# Patient Record
Sex: Female | Born: 1975
Health system: Southern US, Community
[De-identification: ages and names within clinical notes are randomized; demographics above are authoritative.]

## PROBLEM LIST (undated history)

## (undated) DIAGNOSIS — G43909 Migraine, unspecified, not intractable, without status migrainosus: Secondary | ICD-10-CM

## (undated) DIAGNOSIS — F419 Anxiety disorder, unspecified: Secondary | ICD-10-CM

## (undated) DIAGNOSIS — F319 Bipolar disorder, unspecified: Secondary | ICD-10-CM

## (undated) DIAGNOSIS — R251 Tremor, unspecified: Secondary | ICD-10-CM

## (undated) DIAGNOSIS — R51 Headache: Secondary | ICD-10-CM

## (undated) DIAGNOSIS — F32A Depression, unspecified: Secondary | ICD-10-CM

## (undated) DIAGNOSIS — I10 Essential (primary) hypertension: Secondary | ICD-10-CM

## (undated) DIAGNOSIS — R569 Unspecified convulsions: Secondary | ICD-10-CM

## (undated) DIAGNOSIS — R519 Headache, unspecified: Secondary | ICD-10-CM

## (undated) DIAGNOSIS — K589 Irritable bowel syndrome without diarrhea: Secondary | ICD-10-CM

## (undated) DIAGNOSIS — E7800 Pure hypercholesterolemia, unspecified: Secondary | ICD-10-CM

## (undated) DIAGNOSIS — M719 Bursopathy, unspecified: Secondary | ICD-10-CM

## (undated) DIAGNOSIS — K219 Gastro-esophageal reflux disease without esophagitis: Secondary | ICD-10-CM

## (undated) DIAGNOSIS — E669 Obesity, unspecified: Secondary | ICD-10-CM

## (undated) DIAGNOSIS — F329 Major depressive disorder, single episode, unspecified: Secondary | ICD-10-CM

## (undated) DIAGNOSIS — E119 Type 2 diabetes mellitus without complications: Secondary | ICD-10-CM

## (undated) DIAGNOSIS — T7840XA Allergy, unspecified, initial encounter: Secondary | ICD-10-CM

## (undated) DIAGNOSIS — J45909 Unspecified asthma, uncomplicated: Secondary | ICD-10-CM

## (undated) HISTORY — DX: Unspecified convulsions: R56.9

## (undated) HISTORY — DX: Depression, unspecified: F32.A

## (undated) HISTORY — DX: Headache: R51

## (undated) HISTORY — DX: Tremor, unspecified: R25.1

## (undated) HISTORY — DX: Headache, unspecified: R51.9

## (undated) HISTORY — DX: Pure hypercholesterolemia, unspecified: E78.00

## (undated) HISTORY — DX: Type 2 diabetes mellitus without complications: E11.9

## (undated) HISTORY — DX: Bursopathy, unspecified: M71.9

## (undated) HISTORY — DX: Irritable bowel syndrome, unspecified: K58.9

## (undated) HISTORY — DX: Essential (primary) hypertension: I10

## (undated) HISTORY — DX: Unspecified asthma, uncomplicated: J45.909

## (undated) HISTORY — DX: Obesity, unspecified: E66.9

## (undated) HISTORY — DX: Major depressive disorder, single episode, unspecified: F32.9

## (undated) HISTORY — DX: Gastro-esophageal reflux disease without esophagitis: K21.9

## (undated) HISTORY — DX: Irritable bowel syndrome without diarrhea: K58.9

## (undated) HISTORY — DX: Anxiety disorder, unspecified: F41.9

## (undated) HISTORY — DX: Bipolar disorder, unspecified: F31.9

## (undated) HISTORY — DX: Allergy, unspecified, initial encounter: T78.40XA

## (undated) HISTORY — PX: NO PAST SURGERIES: SHX2092

## (undated) HISTORY — DX: Migraine, unspecified, not intractable, without status migrainosus: G43.909

---

## 2010-02-23 DIAGNOSIS — K589 Irritable bowel syndrome without diarrhea: Secondary | ICD-10-CM

## 2010-02-23 HISTORY — DX: Irritable bowel syndrome, unspecified: K58.9

## 2010-05-05 DIAGNOSIS — H52229 Regular astigmatism, unspecified eye: Secondary | ICD-10-CM | POA: Insufficient documentation

## 2013-06-10 ENCOUNTER — Emergency Department: Payer: Self-pay

## 2013-06-10 LAB — COMPREHENSIVE METABOLIC PANEL
Albumin: 3.4 g/dL (ref 3.4–5.0)
Alkaline Phosphatase: 57 U/L
Anion Gap: 7 (ref 7–16)
BUN: 12 mg/dL (ref 7–18)
Bilirubin,Total: 0.4 mg/dL (ref 0.2–1.0)
Calcium, Total: 9.2 mg/dL (ref 8.5–10.1)
Chloride: 103 mmol/L (ref 98–107)
Co2: 29 mmol/L (ref 21–32)
Creatinine: 0.93 mg/dL (ref 0.60–1.30)
EGFR (African American): >60
EGFR (Non-African Amer.): >60
Glucose: 91 mg/dL (ref 65–99)
Osmolality: 277 (ref 275–301)
Potassium: 3.6 mmol/L (ref 3.5–5.1)
SGOT(AST): 102 U/L — ABNORMAL HIGH (ref 15–37)
SGPT (ALT): 59 U/L (ref 12–78)
Sodium: 139 mmol/L (ref 136–145)
Total Protein: 7.4 g/dL (ref 6.4–8.2)

## 2013-06-10 LAB — CBC
HCT: 43.9 % (ref 35.0–47.0)
HGB: 14.2 g/dL (ref 12.0–16.0)
MCH: 30.5 pg (ref 26.0–34.0)
MCHC: 32.4 g/dL (ref 32.0–36.0)
MCV: 94 fL (ref 80–100)
Platelet: 251 10*3/uL (ref 150–440)
RBC: 4.66 10*6/uL (ref 3.80–5.20)
RDW: 13.1 % (ref 11.5–14.5)
WBC: 9 10*3/uL (ref 3.6–11.0)

## 2013-06-11 ENCOUNTER — Emergency Department: Payer: Self-pay | Admitting: Emergency Medicine

## 2013-06-11 LAB — CBC WITH DIFFERENTIAL/PLATELET
Basophil #: 0.1 10*3/uL (ref 0.0–0.1)
Basophil %: 1 %
Eosinophil #: 0.2 10*3/uL (ref 0.0–0.7)
Eosinophil %: 2.5 %
HCT: 42.1 % (ref 35.0–47.0)
HGB: 14.2 g/dL (ref 12.0–16.0)
Lymphocyte #: 2 10*3/uL (ref 1.0–3.6)
Lymphocyte %: 25.5 %
MCH: 31.8 pg (ref 26.0–34.0)
MCHC: 33.7 g/dL (ref 32.0–36.0)
MCV: 94 fL (ref 80–100)
Monocyte #: 0.4 x10 3/mm (ref 0.2–0.9)
Monocyte %: 5.6 %
Neutrophil #: 5.2 10*3/uL (ref 1.4–6.5)
Neutrophil %: 65.4 %
Platelet: 232 10*3/uL (ref 150–440)
RBC: 4.46 10*6/uL (ref 3.80–5.20)
RDW: 12.9 % (ref 11.5–14.5)
WBC: 8 10*3/uL (ref 3.6–11.0)

## 2013-06-11 LAB — BASIC METABOLIC PANEL
Anion Gap: 6 — ABNORMAL LOW (ref 7–16)
BUN: 14 mg/dL (ref 7–18)
Calcium, Total: 8.7 mg/dL (ref 8.5–10.1)
Chloride: 100 mmol/L (ref 98–107)
Co2: 30 mmol/L (ref 21–32)
Creatinine: 0.67 mg/dL (ref 0.60–1.30)
EGFR (African American): >60
EGFR (Non-African Amer.): >60
Glucose: 81 mg/dL (ref 65–99)
Osmolality: 271 (ref 275–301)
Potassium: 3.5 mmol/L (ref 3.5–5.1)
Sodium: 136 mmol/L (ref 136–145)

## 2013-06-11 LAB — URINALYSIS, COMPLETE
Bacteria: NONE SEEN
Bilirubin,UR: NEGATIVE
Blood: NEGATIVE
Glucose,UR: NEGATIVE mg/dL (ref 0–75)
Leukocyte Esterase: NEGATIVE
Nitrite: NEGATIVE
Ph: 7 (ref 4.5–8.0)
Protein: NEGATIVE
RBC,UR: <1 /HPF (ref 0–5)
Specific Gravity: 1.024 (ref 1.003–1.030)
Squamous Epithelial: 1
WBC UR: <1 /HPF (ref 0–5)

## 2013-06-11 LAB — DRUG SCREEN, URINE
Amphetamines, Ur Screen: NEGATIVE (ref ?–1000)
Barbiturates, Ur Screen: POSITIVE (ref ?–200)
Benzodiazepine, Ur Scrn: POSITIVE (ref ?–200)
Cannabinoid 50 Ng, Ur ~~LOC~~: POSITIVE (ref ?–50)
Cocaine Metabolite,Ur ~~LOC~~: NEGATIVE (ref ?–300)
MDMA (Ecstasy)Ur Screen: NEGATIVE (ref ?–500)
Methadone, Ur Screen: NEGATIVE (ref ?–300)
Opiate, Ur Screen: NEGATIVE (ref ?–300)
Phencyclidine (PCP) Ur S: NEGATIVE (ref ?–25)
Tricyclic, Ur Screen: POSITIVE (ref ?–1000)

## 2013-06-11 LAB — SALICYLATE LEVEL: Salicylates, Serum: 3.9 mg/dL — ABNORMAL HIGH

## 2013-06-11 LAB — ACETAMINOPHEN LEVEL: Acetaminophen: <2 ug/mL

## 2013-06-11 LAB — ETHANOL
Ethanol %: <0.003 % (ref 0.000–0.080)
Ethanol: <3 mg/dL

## 2013-06-11 LAB — TSH: Thyroid Stimulating Horm: 1.43 u[IU]/mL

## 2013-06-11 LAB — VALPROIC ACID LEVEL: Valproic Acid: 86 ug/mL

## 2013-11-25 DIAGNOSIS — E669 Obesity, unspecified: Secondary | ICD-10-CM

## 2013-11-25 DIAGNOSIS — R519 Headache, unspecified: Secondary | ICD-10-CM | POA: Insufficient documentation

## 2013-11-25 DIAGNOSIS — R51 Headache: Secondary | ICD-10-CM

## 2013-11-25 HISTORY — DX: Obesity, unspecified: E66.9

## 2014-05-29 NOTE — Consult Note (Signed)
PATIENT NAME:  Vanessa Baker, Vanessa Baker MR#:  [id number] DATE OF BIRTH:  [birth year]  DATE OF CONSULTATION:  06/11/2013  REFERRING PHYSICIAN:   Lavonia Drafts, MD CONSULTING PHYSICIAN:  Cordelia Pen. Gretel Acre, MD  REASON FOR CONSULTATION: Medication adjustment.  HISTORY OF PRESENT ILLNESS:  The patient is a 39 year old female who presented to the ER accompanied by her family members.  She reported that she has been having problems with  slurred speech, tremors and states, "I cannot hold my neck up."   She stated that she started having these symptoms for the past 2 days. She has been having some confusion and complaining of pain in her back of the head and unable to keep her head up. She was having dizziness and blurred vision. She stated that was seen in the ED last night, but left after waiting for a few hours and had the CT scan done at that time. A psychiatric consult was done to have her medications adjusted as she is taking a lot of psychotropic medications.   During my interview, the patient was lying on the bed. Her sister and brother-in-law were present in the room. She reported that the left side of her neck is tilted for the past 2 days. She stated that she took Abilify, which was started recently. Reported that she has a long history of bipolar disorder and she follows with a psychiatrist, Dr. Jana Half in [facility]. She stated that she works at [employer].  Stated that she has been taking a combination of sertraline, Depakote, Abilify, propranolol and Topamax for a long period of time. There are no recent changes in her medications. However, she is unable to sleep at night. The patient reported that she was unable to sleep and she has started sleepwalking recently. She took  amitriptyline from her mother 2 days ago as she was unable to sleep. She reported that since then she has felt that her neck has been tilted. The patient stated that she came to the hospital to seek help. Her sister was concerned that she might be  having a reaction to the medications. The patient currently denied having any suicidal or homicidal ideation or plan. She denied having any perceptual disturbances.   PAST PSYCHIATRIC HISTORY: The patient reported that she has never attempted suicide and was never admitted to a psychiatric hospital. Reported that she has been diagnosed with bipolar disorder and has had testing done for the same. She is currently on the combination of Depakote, sertraline, Topamax, propranolol, Xanax, Abilify and Ambien. She reported that she started  sleepwalking recently. She stated that she does not have any thoughts to hurt herself.   FAMILY HISTORY: Strong family history of bipolar disorder in her sister and mother. They all take bipolar medications and have been admitted to psychiatric hospitals. No history of suicide in the family.   SUBSTANCE ABUSE HISTORY: The patient reported that there is no history of illicit drug use. She drinks occasionally. She does not smoke cigarettes.   SOCIAL HISTORY: The patient currently lives with a roommate; however, she is going to relocate and live with her sister soon. 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 She lives in [place]. She does not have any children.   ALLERGIES: No known drug allergies.   CURRENT MEDICATIONS: Depakote ER 500 mg 2 pills in the morning, 1 at night; sertraline 2 pills in the morning, 1 at night; Topamax, 100 mg b.i.d., propranolol 40 mg b.i.d., Xanax XR 1 pill b.i.d., Abilify 50  mg p.o. daily, zolpidem 10 mg at bedtime.   ANCILLARY DATA:  Temperature 98.2, pulse 70, respirations 17, blood pressure 82/49.   LABORATORY DATA:  Glucose 81, BUN 14, creatinine 0.67, sodium 136, potassium 3.6, chloride 100, bicarbonate 30, anion gap 6,  calcium 8.7, protein 7.4, albumin 3.4, bilirubin 0.4, alkaline phosphatase 37. AST is 102. Depakote level is 86. UDS is positive for barbiturates, benzodiazepines, cannabinoids and tricyclic antidepressants.  WBC  is 8.0. Hemoglobin is 14.2. Platelet count is 232. MCV is 94.   REVIEW OF SYSTEMS: Positive for neck pain and stiffness in the neck. Denies any headache, hematuria. Denies any muscle aches, joint pains.   MENTAL STATUS EXAMINATION: The patient is a moderately built female who was lying in the bed. She appeared her stated age. She maintained fair eye contact. Her speech was low in tone and volume. Mood was anxious. Affect was congruent. Thought process was logical, goal-directed. Thought content was nondelusional. She currently denied having any suicidal or homicidal ideations or plans. She currently denied having any perceptual disturbances. Demonstrated fair insight and judgment. Her language and fund of knowledge appears appropriate.   DIAGNOSTIC IMPRESSION: AXIS I:  1.  Bipolar disorder, not otherwise specified.  2.  Adverse effects to the medications.  AXIS II: None reported.  AXIS III: None.   TREATMENT PLAN: 1.  I discussed with the patient and her family at length about the medication's adverse reactions at length.  Currently, she is experiencing the adverse effects of the amitriptyline and the combination of her other psychotropic medications. She will be given Benadryl 50 mg IM for the same.  2.  Also discussed her case with the ED physician and her medications will be adjusted as follows: Depakote ER 500 mg p.o. b.i.d., sertraline 100 mg p.o. q.a.m., Topamax 25 mg p.o. at bedtime, propranolol 10 mg p.o. b.i.d., Xanax 1 mg p.o. b.i.d. and Abilify 10 mg p.o. q.a.m. The patient will be given prescription for the same. I advised her to follow up with her outpatient psychiatrist and she demonstrated  understanding.  I advised the patient to return to the ED if she noticed worsening of her symptoms, and she agreed with the plan.   Thank you for allowing me to participate in the care of this patient.   ____________________________ Cordelia Pen. Gretel Acre, MD usf:dmm D: 06/11/2013 15:34:44  ET T: 06/11/2013 19:48:30 ET JOB#: [id number]  cc: Cordelia Pen. Gretel Acre, MD, <Dictator> Jeronimo Norma MD ELECTRONICALLY SIGNED 06/18/2013 10:58

## 2014-12-09 ENCOUNTER — Ambulatory Visit: Payer: Self-pay

## 2014-12-09 LAB — BASIC METABOLIC PANEL
BUN: 11 mg/dL (ref 4–21)
Creatinine: 0.6 mg/dL (ref <=1.1)
Glucose: 88 mg/dL
Potassium: 4.8 mmol/L (ref 3.4–5.3)
Sodium: 139 mmol/L (ref 137–147)

## 2014-12-09 LAB — CBC AND DIFFERENTIAL
HCT: 42 % (ref 36–46)
Hemoglobin: 14.4 g/dL (ref 12.0–16.0)
Neutrophils Absolute: 6 /uL
Platelets: 278 10*3/uL (ref 150–399)
WBC: 10.5 10^3/mL

## 2014-12-09 LAB — TSH: TSH: 2.55 u[IU]/mL (ref <=5.90)

## 2014-12-09 LAB — HEMOGLOBIN A1C: Hemoglobin A1C: 5.4

## 2014-12-16 ENCOUNTER — Ambulatory Visit: Payer: Self-pay

## 2014-12-17 ENCOUNTER — Other Ambulatory Visit: Payer: Self-pay | Admitting: Nurse Practitioner

## 2014-12-17 DIAGNOSIS — K5909 Other constipation: Secondary | ICD-10-CM

## 2014-12-20 ENCOUNTER — Ambulatory Visit: Payer: Self-pay

## 2015-01-05 ENCOUNTER — Institutional Professional Consult (permissible substitution): Payer: Self-pay | Admitting: Specialist

## 2015-01-19 ENCOUNTER — Ambulatory Visit: Payer: Self-pay

## 2015-02-16 ENCOUNTER — Ambulatory Visit: Payer: Self-pay

## 2015-02-16 ENCOUNTER — Encounter (INDEPENDENT_AMBULATORY_CARE_PROVIDER_SITE_OTHER): Payer: Self-pay

## 2015-03-15 ENCOUNTER — Ambulatory Visit: Payer: Self-pay

## 2015-03-18 ENCOUNTER — Ambulatory Visit
Admission: RE | Admit: 2015-03-18 | Discharge: 2015-03-18 | Disposition: A | Discharge status: Home / Self Care | Payer: Self-pay | Source: Ambulatory Visit | Attending: Nurse Practitioner | Admitting: Nurse Practitioner

## 2015-03-18 ENCOUNTER — Ambulatory Visit
Admission: RE | Admit: 2015-03-18 | Discharge: 2015-03-18 | Disposition: A | Discharge status: Home / Self Care | Payer: Self-pay | Source: Ambulatory Visit | Attending: *Deleted | Admitting: *Deleted

## 2015-03-18 DIAGNOSIS — K5909 Other constipation: Secondary | ICD-10-CM

## 2015-03-18 DIAGNOSIS — I499 Cardiac arrhythmia, unspecified: Secondary | ICD-10-CM | POA: Insufficient documentation

## 2015-03-18 DIAGNOSIS — Z79899 Other long term (current) drug therapy: Secondary | ICD-10-CM | POA: Insufficient documentation

## 2015-03-24 ENCOUNTER — Other Ambulatory Visit: Payer: Self-pay

## 2015-03-24 LAB — LIPID PANEL
Cholesterol: 272 mg/dL — AB (ref 0–200)
HDL: 42 mg/dL (ref 35–70)
LDL Cholesterol: 153 mg/dL
Triglycerides: 387 mg/dL — AB (ref 40–160)

## 2015-03-24 LAB — HEPATIC FUNCTION PANEL
ALT: 7 U/L (ref 7–35)
AST: 16 U/L (ref 13–35)
Alkaline Phosphatase: 55 U/L (ref 25–125)
Bilirubin, Total: 0.1 mg/dL

## 2015-03-31 ENCOUNTER — Ambulatory Visit: Payer: Self-pay

## 2015-03-31 DIAGNOSIS — F3175 Bipolar disorder, in partial remission, most recent episode depressed: Secondary | ICD-10-CM | POA: Insufficient documentation

## 2015-03-31 DIAGNOSIS — F32A Depression, unspecified: Secondary | ICD-10-CM | POA: Insufficient documentation

## 2015-03-31 DIAGNOSIS — F319 Bipolar disorder, unspecified: Secondary | ICD-10-CM

## 2015-03-31 DIAGNOSIS — G4700 Insomnia, unspecified: Secondary | ICD-10-CM | POA: Insufficient documentation

## 2015-03-31 DIAGNOSIS — R0600 Dyspnea, unspecified: Secondary | ICD-10-CM | POA: Insufficient documentation

## 2015-03-31 DIAGNOSIS — E785 Hyperlipidemia, unspecified: Secondary | ICD-10-CM | POA: Insufficient documentation

## 2015-03-31 DIAGNOSIS — F3130 Bipolar disorder, current episode depressed, mild or moderate severity, unspecified: Secondary | ICD-10-CM | POA: Insufficient documentation

## 2015-03-31 DIAGNOSIS — F329 Major depressive disorder, single episode, unspecified: Secondary | ICD-10-CM | POA: Insufficient documentation

## 2015-04-01 DIAGNOSIS — G4700 Insomnia, unspecified: Secondary | ICD-10-CM

## 2015-04-01 DIAGNOSIS — F32A Depression, unspecified: Secondary | ICD-10-CM

## 2015-04-01 DIAGNOSIS — E785 Hyperlipidemia, unspecified: Secondary | ICD-10-CM

## 2015-04-01 DIAGNOSIS — R0600 Dyspnea, unspecified: Secondary | ICD-10-CM

## 2015-04-01 DIAGNOSIS — F329 Major depressive disorder, single episode, unspecified: Secondary | ICD-10-CM

## 2015-04-01 DIAGNOSIS — F319 Bipolar disorder, unspecified: Secondary | ICD-10-CM

## 2015-04-19 ENCOUNTER — Ambulatory Visit: Payer: Self-pay | Admitting: Nurse Practitioner

## 2015-04-19 ENCOUNTER — Other Ambulatory Visit: Payer: Self-pay

## 2015-04-19 VITALS — BP 149/85 | HR 70 | Temp 98.4°F | Resp 18 | Ht 62.0 in | Wt 171.0 lb

## 2015-04-19 DIAGNOSIS — K5900 Constipation, unspecified: Secondary | ICD-10-CM | POA: Insufficient documentation

## 2015-04-19 DIAGNOSIS — F17219 Nicotine dependence, cigarettes, with unspecified nicotine-induced disorders: Secondary | ICD-10-CM | POA: Insufficient documentation

## 2015-04-19 DIAGNOSIS — F17200 Nicotine dependence, unspecified, uncomplicated: Secondary | ICD-10-CM | POA: Insufficient documentation

## 2015-04-19 DIAGNOSIS — J329 Chronic sinusitis, unspecified: Secondary | ICD-10-CM | POA: Insufficient documentation

## 2015-04-19 DIAGNOSIS — E785 Hyperlipidemia, unspecified: Secondary | ICD-10-CM

## 2015-04-19 DIAGNOSIS — J0100 Acute maxillary sinusitis, unspecified: Secondary | ICD-10-CM

## 2015-04-19 DIAGNOSIS — J069 Acute upper respiratory infection, unspecified: Secondary | ICD-10-CM | POA: Insufficient documentation

## 2015-04-19 DIAGNOSIS — Z720 Tobacco use: Secondary | ICD-10-CM

## 2015-04-19 NOTE — Assessment & Plan Note (Signed)
Pt advised to quit smoking. Pt given prescription for Nicotrol inhaler.

## 2015-04-19 NOTE — Progress Notes (Unsigned)
   Subjective:    Patient ID: Vanessa Baker, female    DOB: 1975/09/25, 40 y.o.   MRN: [id number]  HPI  Pt presents with an upper respiratory infection. Runny nose, sore throat, cough.   Review of Systems  Constitutional: Negative for fever.  HENT: Positive for congestion, facial swelling, sinus pressure, sneezing and sore throat.   Respiratory: Positive for cough.   Hematological: Negative for adenopathy.      Objective:   Physical Exam  Cardiovascular: Normal rate and regular rhythm.   Pulmonary/Chest: Respiratory distress: diminished but clear.  Lymphadenopathy:    She has no cervical adenopathy.  Tenderness of left maxillary sinus    BP 149/85 mmHg  Pulse 70  Temp(Src) 98.4 F (36.9 C) (Oral)  Resp 18  Ht '5\' 2"'$  (1.575 m)  Wt 171 lb (77.565 kg)  BMI 31.27 kg/m2    Medication List       This list is accurate as of: 04/19/15  7:21 PM.  Always use your most recent med list.               CALCIUM + D PO  Take by mouth.     divalproex 500 MG DR tablet  Commonly known as:  DEPAKOTE  Take 500 mg by mouth 3 (three) times daily.     Fish Oil 1000 MG Caps  Take by mouth.     lurasidone 40 MG Tabs tablet  Commonly known as:  LATUDA  Take 40 mg by mouth daily with breakfast.     sertraline 100 MG tablet  Commonly known as:  ZOLOFT  Take 200 mg by mouth daily.          Assessment & Plan:  Tobacco abuse Pt advised to quit smoking. Pt given prescription for Nicotrol inhaler.   Hyperlipemia Most recent lipid panel show increased triglycerides.   Sinusitis Pt prescribed cephalexin 300 mg.  Labs tonight: CMP, lipid, CBC Md f/u: 1 month  Needs dental referral Needs an abdominal ultrasound.

## 2015-04-19 NOTE — Assessment & Plan Note (Signed)
Pt prescribed cephalexin 300 mg.

## 2015-04-19 NOTE — Assessment & Plan Note (Signed)
Most recent lipid panel show increased triglycerides.

## 2015-04-20 LAB — COMPREHENSIVE METABOLIC PANEL
ALT: 6 IU/L (ref 0–32)
AST: 8 IU/L (ref 0–40)
Albumin/Globulin Ratio: 1.6 (ref 1.2–2.2)
Albumin: 4 g/dL (ref 3.5–5.5)
Alkaline Phosphatase: 57 IU/L (ref 39–117)
BUN/Creatinine Ratio: 13 (ref 9–23)
BUN: 9 mg/dL (ref 6–24)
Bilirubin Total: <0.2 mg/dL (ref 0.0–1.2)
CO2: 22 mmol/L (ref 18–29)
Calcium: 8.9 mg/dL (ref 8.7–10.2)
Chloride: 98 mmol/L (ref 96–106)
Creatinine, Ser: 0.7 mg/dL (ref 0.57–1.00)
GFR calc Af Amer: 124 mL/min/{1.73_m2} (ref >59)
GFR calc non Af Amer: 108 mL/min/{1.73_m2} (ref >59)
Globulin, Total: 2.5 g/dL (ref 1.5–4.5)
Glucose: 102 mg/dL — ABNORMAL HIGH (ref 65–99)
Potassium: 3.9 mmol/L (ref 3.5–5.2)
Sodium: 137 mmol/L (ref 134–144)
Total Protein: 6.5 g/dL (ref 6.0–8.5)

## 2015-04-20 LAB — CBC WITH DIFFERENTIAL/PLATELET
Basophils Absolute: 0.2 10*3/uL (ref 0.0–0.2)
Basos: 1 %
EOS (ABSOLUTE): 0.3 10*3/uL (ref 0.0–0.4)
Eos: 2 %
Hematocrit: 42.3 % (ref 34.0–46.6)
Hemoglobin: 14.8 g/dL (ref 11.1–15.9)
Immature Grans (Abs): 0 10*3/uL (ref 0.0–0.1)
Immature Granulocytes: 0 %
Lymphocytes Absolute: 2.4 10*3/uL (ref 0.7–3.1)
Lymphs: 20 %
MCH: 31.6 pg (ref 26.6–33.0)
MCHC: 35 g/dL (ref 31.5–35.7)
MCV: 90 fL (ref 79–97)
Monocytes Absolute: 0.7 10*3/uL (ref 0.1–0.9)
Monocytes: 6 %
Neutrophils Absolute: 8.2 10*3/uL — ABNORMAL HIGH (ref 1.4–7.0)
Neutrophils: 71 %
Platelets: 275 10*3/uL (ref 150–379)
RBC: 4.69 x10E6/uL (ref 3.77–5.28)
RDW: 14.1 % (ref 12.3–15.4)
WBC: 11.8 10*3/uL — ABNORMAL HIGH (ref 3.4–10.8)

## 2015-04-20 LAB — LIPID PANEL W/O CHOL/HDL RATIO
Cholesterol, Total: 242 mg/dL — ABNORMAL HIGH (ref 100–199)
HDL: 30 mg/dL — ABNORMAL LOW
Triglycerides: 402 mg/dL — ABNORMAL HIGH (ref 0–149)

## 2015-05-17 ENCOUNTER — Ambulatory Visit: Payer: Self-pay | Admitting: Family Medicine

## 2015-05-17 VITALS — BP 138/91 | HR 88 | Resp 14 | Ht 62.0 in | Wt 171.0 lb

## 2015-05-17 DIAGNOSIS — F319 Bipolar disorder, unspecified: Secondary | ICD-10-CM

## 2015-05-17 DIAGNOSIS — Z720 Tobacco use: Secondary | ICD-10-CM

## 2015-05-17 DIAGNOSIS — R85619 Unspecified abnormal cytological findings in specimens from anus: Secondary | ICD-10-CM

## 2015-05-17 DIAGNOSIS — E785 Hyperlipidemia, unspecified: Secondary | ICD-10-CM

## 2015-05-17 DIAGNOSIS — R87619 Unspecified abnormal cytological findings in specimens from cervix uteri: Secondary | ICD-10-CM

## 2015-05-17 DIAGNOSIS — E1169 Type 2 diabetes mellitus with other specified complication: Secondary | ICD-10-CM | POA: Insufficient documentation

## 2015-05-17 MED ORDER — ALBUTEROL SULFATE HFA 108 (90 BASE) MCG/ACT IN AERS: 2.0000 | INHALATION_SPRAY | RESPIRATORY_TRACT | Status: DC | PRN

## 2015-05-17 MED ORDER — ATORVASTATIN CALCIUM 10 MG PO TABS: 10.0000 mg | ORAL_TABLET | Freq: Every day | ORAL | Status: DC

## 2015-05-17 NOTE — Progress Notes (Signed)
       Patient: Vanessa Baker Female    DOB: April 16, 1975   40 y.o.   MRN: [id number] Visit Date: 05/17/2015  Today's Provider: [place]   Chief Complaint  Patient presents with  . Follow-up   Subjective:    HPI 40 yo female here for recheck on triglycerides and cholesterol.   Does have family of heart disease and a smoker.  Is bipolar and having a hard time quitting. Is cutting back. Also, needs new inhaler. Gets her medication at medication management clinic.       Allergies  Allergen Reactions  . Amitriptyline    Previous Medications   CALCIUM CITRATE-VITAMIN D (CALCIUM + D PO)    Take by mouth.   DIVALPROEX (DEPAKOTE) 500 MG DR TABLET    Take 500 mg by mouth 3 (three) times daily.   LURASIDONE (LATUDA) 40 MG TABS TABLET    Take 40 mg by mouth daily with breakfast.   MULTIPLE VITAMIN (MULTIVITAMIN) CAPSULE    Take 1 capsule by mouth daily.   OMEGA-3 FATTY ACIDS (FISH OIL) 1000 MG CAPS    Take by mouth.   SERTRALINE (ZOLOFT) 100 MG TABLET    Take 200 mg by mouth daily.    Review of Systems  Constitutional: Negative for fever and unexpected weight change.  Respiratory: Positive for cough and shortness of breath.   Psychiatric/Behavioral:       Sees [facility].     Social History  Substance Use Topics  . Smoking status: Current Every Day Smoker -- 0.50 packs/day  . Smokeless tobacco: Not on file  . Alcohol Use: Yes     Comment: alcoholic   Objective:   BP 138/91 mmHg  Pulse 88  Resp 14  Ht '5\' 2"'$  (1.575 m)  Wt 171 lb (77.565 kg)  BMI 31.27 kg/m2  Physical Exam  Constitutional: She is oriented to person, place, and time. She appears well-developed.  Neurological: She is alert and oriented to person, place, and time.  Psychiatric: She has a normal mood and affect. Her behavior is normal. Judgment and thought content normal.  Can not be around people. They piss her off and she freaks out.       Assessment & Plan:     1. Tobacco abuse Continue  to cut back. Refilled inhaler.  - albuterol (PROVENTIL HFA;VENTOLIN HFA) 108 (90 Base) MCG/ACT inhaler; Inhale 2 puffs into the lungs every 4 (four) hours as needed for wheezing or shortness of breath.  Dispense: 1 Inhaler; Refill: 3  2. Bipolar I disorder ([place]) Note that can not work at this time for food stamps.   3. Hyperlipidemia New  Problem. Condition is worsening. Will start medication for better control.  Recheck in 4 weeks. Stop if any side effects.  - atorvastatin (LIPITOR) 10 MG tablet; Take 1 tablet (10 mg total) by mouth daily.  Dispense: 90 tablet; Refill: 3  4. Abnormal cervical Papanicolaou smear, unspecified abnormal pap finding History of HPV, needs PAP. Also will refer for mammogram. Overdue and has family history.  - Ambulatory referral to Gynecology  Margarita Rana, MD      [facility]

## 2015-06-01 ENCOUNTER — Ambulatory Visit
Admission: RE | Admit: 2015-06-01 | Discharge: 2015-06-01 | Disposition: A | Discharge status: Home / Self Care | Payer: Self-pay | Source: Ambulatory Visit | Attending: Oncology | Admitting: Oncology

## 2015-06-01 ENCOUNTER — Ambulatory Visit: Payer: Self-pay | Attending: Oncology

## 2015-06-01 VITALS — BP 146/88 | HR 71 | Temp 98.3°F | Resp 20 | Ht 62.99 in | Wt 171.6 lb

## 2015-06-01 DIAGNOSIS — Z0000 Encounter for general adult medical examination without abnormal findings: Secondary | ICD-10-CM

## 2015-06-01 NOTE — Progress Notes (Signed)
Subjective:     Patient ID: Vanessa Baker, female   DOB: 1975-06-29, 40 y.o.   MRN: [id number]  HPI   Review of Systems     Objective:   Physical Exam  Pulmonary/Chest: Right breast exhibits no inverted nipple, no mass, no nipple discharge, no skin change and no tenderness. Left breast exhibits no inverted nipple, no mass, no nipple discharge, no skin change and no tenderness. Breasts are symmetrical.  Multiple tattoos  Genitourinary: No labial fusion. There is no rash, tenderness, lesion or injury on the right labia. There is no rash, tenderness, lesion or injury on the left labia. Uterus is not deviated, not enlarged, not fixed and not tender. Cervix exhibits no motion tenderness, no discharge and no friability. Right adnexum displays no mass, no tenderness and no fullness. Left adnexum displays tenderness. Left adnexum displays no mass and no fullness. No erythema, tenderness or bleeding in the vagina. No foreign body around the vagina. No signs of injury around the vagina. No vaginal discharge found.       Assessment:     40 year old patient presents for [facility] clinic visit.  Patient screened, and meets BCCCP eligibility.  Patient does not have insurance, Medicare or Medicaid.  Handout given on Affordable Care Act.  Instructed patient on breast self-exam using teach back method.  CBE unremarkable.  Patient  Had HPV positive pap at [facility] in 2016 .  Pelvic exam normal.  Mild tenderness left lower quadrant on palpation.    Plan:  Sent for bilateral screening mammogram.  Specimen collected for pap.

## 2015-06-02 ENCOUNTER — Other Ambulatory Visit: Payer: Self-pay

## 2015-06-06 LAB — PAP LB AND HPV HIGH-RISK
HPV, high-risk: NEGATIVE
PAP Smear Comment: 0

## 2015-06-15 NOTE — Progress Notes (Signed)
Phoned patient with normal mammogram, and negative pap results with negative HPV.  To return next year for annual screening.   Copy to [facility].

## 2015-06-16 ENCOUNTER — Telehealth: Payer: Self-pay | Admitting: Urology

## 2015-06-16 NOTE — Telephone Encounter (Signed)
Scheduled OBGYN apt for 6/29.

## 2015-06-21 ENCOUNTER — Other Ambulatory Visit: Payer: Self-pay

## 2015-06-23 ENCOUNTER — Other Ambulatory Visit: Payer: Self-pay

## 2015-06-30 ENCOUNTER — Other Ambulatory Visit: Payer: Self-pay

## 2015-06-30 ENCOUNTER — Other Ambulatory Visit: Payer: Self-pay | Admitting: Nurse Practitioner

## 2015-07-07 ENCOUNTER — Other Ambulatory Visit: Payer: Self-pay

## 2015-07-07 DIAGNOSIS — E785 Hyperlipidemia, unspecified: Secondary | ICD-10-CM

## 2015-07-08 LAB — LIPID PANEL
Chol/HDL Ratio: 5.8 ratio units — ABNORMAL HIGH (ref 0.0–4.4)
Cholesterol, Total: 197 mg/dL (ref 100–199)
HDL: 34 mg/dL — ABNORMAL LOW (ref >39)
LDL Calculated: 89 mg/dL (ref 0–99)
Triglycerides: 370 mg/dL — ABNORMAL HIGH (ref 0–149)
VLDL Cholesterol Cal: 74 mg/dL — ABNORMAL HIGH (ref 5–40)

## 2015-08-04 ENCOUNTER — Institutional Professional Consult (permissible substitution): Payer: Self-pay

## 2015-09-22 ENCOUNTER — Ambulatory Visit: Payer: Self-pay

## 2015-10-04 ENCOUNTER — Ambulatory Visit: Payer: Self-pay

## 2015-10-11 ENCOUNTER — Ambulatory Visit: Payer: Self-pay

## 2015-10-13 ENCOUNTER — Ambulatory Visit: Payer: Self-pay | Admitting: Urology

## 2015-10-13 VITALS — BP 152/83 | HR 75 | Temp 98.0°F | Resp 16 | Ht 62.0 in | Wt 173.0 lb

## 2015-10-13 DIAGNOSIS — G20 Parkinson's disease: Secondary | ICD-10-CM

## 2015-10-13 MED ORDER — ATORVASTATIN CALCIUM 20 MG PO TABS: 20.0000 mg | ORAL_TABLET | Freq: Every day | ORAL | 0 refills | Status: DC

## 2015-10-13 MED ORDER — ALBUTEROL SULFATE HFA 108 (90 BASE) MCG/ACT IN AERS: INHALATION_SPRAY | RESPIRATORY_TRACT | 0 refills | Status: DC

## 2015-10-13 NOTE — Progress Notes (Signed)
       Patient: Vanessa Baker Female    DOB: [birth year]   40 y.o.   MRN: [id number] Visit Date: 10/13/2015  Today's Provider: [place]   Chief Complaint  Patient presents with  . Follow-up   Subjective:    HPI 40 yo female here for recheck on triglycerides and cholesterol.   Does have family of heart disease and a smoker.  Is bipolar and having a hard time quitting. Is cutting back. Also, needs new inhaler. Gets her medication at medication management clinic.     She is fearful that she has Parkinson's disease as she has resting tremors, balance issues and generalize weakness.  She also has a strong family history of Parkinson's diease.      Allergies  Allergen Reactions  . Amitriptyline    Previous Medications   CALCIUM CITRATE-VITAMIN D (CALCIUM + D PO)    Take by mouth.   DIVALPROEX (DEPAKOTE) 500 MG DR TABLET    Take 500 mg by mouth 3 (three) times daily.   LURASIDONE (LATUDA) 40 MG TABS TABLET    Take 40 mg by mouth daily with breakfast.   MULTIPLE VITAMIN (MULTIVITAMIN) CAPSULE    Take 1 capsule by mouth daily.   OMEGA-3 FATTY ACIDS (FISH OIL) 1000 MG CAPS    Take by mouth.   SERTRALINE (ZOLOFT) 100 MG TABLET    Take 200 mg by mouth daily.    Review of Systems  Constitutional: Negative for fever and unexpected weight change.  Respiratory: Positive for cough and shortness of breath.   Psychiatric/Behavioral:       Sees [facility].     Social History  Substance Use Topics  . Smoking status: Current Every Day Smoker    Packs/day: 0.50  . Smokeless tobacco: Not on file  . Alcohol use Yes     Comment: alcoholic   Objective:   BP (!) 152/83   Pulse 75   Temp 98 F (36.7 C) (Oral)   Resp 16   Ht '5\' 2"'$  (1.575 m)   Wt 173 lb (78.5 kg)   BMI 31.64 kg/m   Physical Exam  Constitutional: She is oriented to person, place, and time. She appears well-developed.  Neurological: She is alert and oriented to person, place, and time.  Psychiatric: She  has a normal mood and affect. Her behavior is normal. Judgment and thought content normal.  Can not be around people. They piss her off and she freaks out.       Assessment & Plan:     1. Tobacco abuse Continue to cut back. Refilled inhaler.  - albuterol (PROVENTIL HFA;VENTOLIN HFA) 108 (90 Base) MCG/ACT inhaler; Inhale 2 puffs into the lungs every 4 (four) hours as needed for wheezing or shortness of breath.  Dispense: 1 Inhaler; Refill: 3  2. Bipolar I disorder ([place]) Note that can not work at this time for food stamps.   3. Hyperlipidemia  - increased Lipitor to 20 mg and then recheck in one month  4. Abnormal cervical Papanicolaou smear, unspecified abnormal pap finding History of HPV, had mammogram and PAP smear in the spring 2017, both negative, RTC in one year for repeat exams  5. Possible parkinson's disease  - refer to neurology for further work up  [employer], PA-C      [place]

## 2015-11-10 ENCOUNTER — Other Ambulatory Visit: Payer: Self-pay

## 2015-11-10 DIAGNOSIS — G20 Parkinson's disease: Secondary | ICD-10-CM

## 2015-11-11 LAB — LIPID PANEL
Chol/HDL Ratio: 5.2 ratio units — ABNORMAL HIGH (ref 0.0–4.4)
Cholesterol, Total: 173 mg/dL (ref 100–199)
HDL: 33 mg/dL — ABNORMAL LOW (ref >39)
LDL Calculated: 99 mg/dL (ref 0–99)
Triglycerides: 207 mg/dL — ABNORMAL HIGH (ref 0–149)
VLDL Cholesterol Cal: 41 mg/dL — ABNORMAL HIGH (ref 5–40)

## 2015-11-11 LAB — CBC WITH DIFFERENTIAL/PLATELET
Basophils Absolute: 0.2 10*3/uL (ref 0.0–0.2)
Basos: 2 %
EOS (ABSOLUTE): 0.2 10*3/uL (ref 0.0–0.4)
Eos: 2 %
Hematocrit: 42.3 % (ref 34.0–46.6)
Hemoglobin: 14.6 g/dL (ref 11.1–15.9)
Immature Grans (Abs): 0 10*3/uL (ref 0.0–0.1)
Immature Granulocytes: 0 %
Lymphocytes Absolute: 3 10*3/uL (ref 0.7–3.1)
Lymphs: 32 %
MCH: 31.3 pg (ref 26.6–33.0)
MCHC: 34.5 g/dL (ref 31.5–35.7)
MCV: 91 fL (ref 79–97)
Monocytes Absolute: 0.6 10*3/uL (ref 0.1–0.9)
Monocytes: 6 %
Neutrophils Absolute: 5.4 10*3/uL (ref 1.4–7.0)
Neutrophils: 58 %
Platelets: 271 10*3/uL (ref 150–379)
RBC: 4.66 x10E6/uL (ref 3.77–5.28)
RDW: 13.3 % (ref 12.3–15.4)
WBC: 9.4 10*3/uL (ref 3.4–10.8)

## 2015-11-11 LAB — COMPREHENSIVE METABOLIC PANEL
ALT: 14 IU/L (ref 0–32)
AST: 15 IU/L (ref 0–40)
Albumin/Globulin Ratio: 1.7 (ref 1.2–2.2)
Albumin: 4 g/dL (ref 3.5–5.5)
Alkaline Phosphatase: 63 IU/L (ref 39–117)
BUN/Creatinine Ratio: 14 (ref 9–23)
BUN: 11 mg/dL (ref 6–24)
Bilirubin Total: <0.2 mg/dL (ref 0.0–1.2)
CO2: 28 mmol/L (ref 18–29)
Calcium: 9.3 mg/dL (ref 8.7–10.2)
Chloride: 95 mmol/L — ABNORMAL LOW (ref 96–106)
Creatinine, Ser: 0.8 mg/dL (ref 0.57–1.00)
GFR calc Af Amer: 106 mL/min/{1.73_m2} (ref >59)
GFR calc non Af Amer: 92 mL/min/{1.73_m2} (ref >59)
Globulin, Total: 2.4 g/dL (ref 1.5–4.5)
Glucose: 108 mg/dL — ABNORMAL HIGH (ref 65–99)
Potassium: 4.8 mmol/L (ref 3.5–5.2)
Sodium: 138 mmol/L (ref 134–144)
Total Protein: 6.4 g/dL (ref 6.0–8.5)

## 2015-11-11 LAB — HEMOGLOBIN A1C
Est. average glucose Bld gHb Est-mCnc: 108 mg/dL
Hgb A1c MFr Bld: 5.4 % (ref 4.8–5.6)

## 2015-11-11 LAB — VALPROIC ACID LEVEL: Valproic Acid Lvl: 71 ug/mL (ref 50–100)

## 2015-11-11 LAB — PROLACTIN: Prolactin: 10.2 ng/mL (ref 4.8–23.3)

## 2015-11-16 ENCOUNTER — Ambulatory Visit: Payer: Self-pay | Admitting: Neurology

## 2015-11-17 ENCOUNTER — Telehealth: Payer: Self-pay | Admitting: Nurse Practitioner

## 2015-11-17 ENCOUNTER — Ambulatory Visit: Payer: Self-pay

## 2015-11-17 NOTE — Telephone Encounter (Signed)
I called Kareem back and we rescheduled her appointment for the 24th of October.

## 2015-11-17 NOTE — Telephone Encounter (Signed)
Vanessa Baker called to reschedule her appointment for tonight.

## 2015-11-22 ENCOUNTER — Encounter: Payer: Self-pay | Admitting: Neurology

## 2015-11-29 ENCOUNTER — Ambulatory Visit: Payer: Self-pay | Admitting: Nurse Practitioner

## 2015-11-29 VITALS — BP 148/83 | HR 70 | Ht 62.0 in | Wt 175.0 lb

## 2015-11-29 DIAGNOSIS — R5382 Chronic fatigue, unspecified: Secondary | ICD-10-CM

## 2015-11-29 DIAGNOSIS — E782 Mixed hyperlipidemia: Secondary | ICD-10-CM

## 2015-11-29 MED ORDER — ATORVASTATIN CALCIUM 20 MG PO TABS: 20.0000 mg | ORAL_TABLET | Freq: Every day | ORAL | 0 refills | Status: DC

## 2015-11-29 MED ORDER — ALBUTEROL SULFATE HFA 108 (90 BASE) MCG/ACT IN AERS: INHALATION_SPRAY | RESPIRATORY_TRACT | 0 refills | Status: DC

## 2015-11-29 NOTE — Progress Notes (Signed)
History of bipolar disorder  Recent labs done with valporic acid level being entirely normal at 71.     Labs fully reviewed with pt.    No other changes at this time   Exam done at last visit, will not repeat this p.m.   Will order labs for 3 months out and have visit scheduled after next set of labs

## 2015-12-14 ENCOUNTER — Other Ambulatory Visit: Payer: Self-pay

## 2015-12-14 ENCOUNTER — Ambulatory Visit: Payer: Self-pay | Admitting: Ophthalmology

## 2015-12-14 DIAGNOSIS — Z23 Encounter for immunization: Secondary | ICD-10-CM

## 2015-12-14 NOTE — Progress Notes (Unsigned)
PT received flu vaccine in right deltoid. PT tolerated well. VIS given to pt.

## 2016-01-12 ENCOUNTER — Telehealth: Payer: Self-pay | Admitting: Nurse Practitioner

## 2016-01-12 NOTE — Telephone Encounter (Signed)
Called patient back to reschedule appointment

## 2016-01-19 ENCOUNTER — Ambulatory Visit: Payer: Self-pay

## 2016-02-02 ENCOUNTER — Other Ambulatory Visit: Payer: Self-pay

## 2016-03-01 ENCOUNTER — Ambulatory Visit: Payer: Self-pay

## 2016-03-05 ENCOUNTER — Telehealth: Payer: Self-pay | Admitting: Pharmacist

## 2016-03-05 NOTE — Telephone Encounter (Signed)
Teva PAP refill submitted to manufacturer today for [employer].

## 2016-03-07 ENCOUNTER — Encounter: Payer: Self-pay | Admitting: Neurology

## 2016-03-07 ENCOUNTER — Ambulatory Visit (INDEPENDENT_AMBULATORY_CARE_PROVIDER_SITE_OTHER): Payer: Self-pay | Admitting: Neurology

## 2016-03-07 VITALS — BP 151/87 | HR 77 | Ht 62.0 in | Wt 185.8 lb

## 2016-03-07 DIAGNOSIS — R569 Unspecified convulsions: Secondary | ICD-10-CM

## 2016-03-07 DIAGNOSIS — G2581 Restless legs syndrome: Secondary | ICD-10-CM

## 2016-03-07 DIAGNOSIS — R251 Tremor, unspecified: Secondary | ICD-10-CM

## 2016-03-07 MED ORDER — PROPRANOLOL HCL 20 MG PO TABS: 20.0000 mg | ORAL_TABLET | Freq: Two times a day (BID) | ORAL | 6 refills | Status: DC

## 2016-03-07 NOTE — Progress Notes (Signed)
[facility]    Provider:  Dr Jaynee Eagles Referring Provider: Zara Baker Primary Care Physician:  Vanessa Baker   CC:  tremor  HPI:  Vanessa Baker is a 41 y.o. female here as a referral from Dr. No ref. provider found for tremors specifically parkinson's disease. Past medical history migraine, depression, anxiety, cholesterol, bipolar disorder, bursitis, IBS. She is already been evaluated by neurology in the past and the tremors were diagnosed as secondary to psychotropic medications. Everything shakes. Started years ago. Here with her father who also provides information. Symptoms are worsening. Started 5-8 years ago. It started in her hands first. Now everything shakes including the head, legs, hands. Started in the left hand. Now even her legs shake and her feet shake. She can even feel her eyes shake. Continuous and never stops. If she gets upset it gets worse. Nothing makes it better. Mother has tremors unknown why. Maternal grandfather had parkinson's disease. Worse with using a spoon and drinking, worse with use. Feels her head shaking. No changes in voice. No difficulty walking but hurts to walk due to bursitis. She has fallen after stepping on her leg and her leg gave out and she fell. She also reports restless legs at night 2-3x a week. Tremors are getting worse. She has "staring spells" without loss of consciousness or loss of awareness.   Reviewed notes, labs and imaging from outside physicians, which showed:   LDL 99, cholesterol 173, triglycerides 207, A1c 5.4, BUN 11, creatinine 0.8 11/10/2015. TSH 2.550 12/09/2014  CT head 06/2013: Personally reviewed images and agree with the following:  FINDINGS:  There is no evidence of mass effect, midline shift or extra-axial  fluid collections. There is no evidence of a space-occupying lesion  or intracranial hemorrhage. There is no evidence of a cortical-based  area of acute infarction.   The ventricles  and sulci are appropriate for the patient's age. The  basal cisterns are patent.   Visualized portions of the orbits are unremarkable. The visualized  portions of the paranasal sinuses and mastoid air cells are  unremarkable.   The osseous structures are unremarkable.   IMPRESSION:  Normal CT of the brain without intravenous contrast.    Electronically Signed    By: Kathreen Devoid    On: 06/10/2013 18:51   Reviewed notes from [facility]. Patient actually has been seen by neurology for tremors. She was seen in 2015 in neurology. She reported tremors in both hands the left hand is worse. Happens with rest and activity. Her hands will jerk while she is typing. Tremor is constant. Severe interferes with eating, drinking and holding objects. She drops objects. Being upset or stress makes the tremor worse. She has a hard time putting makeup on and putting sugar under coffee without making a mess. Concern for tremor was due to antipsychotic medication exposure. Essential tremor is also on the differential diagnosis in the setting of chronic antipsychotic use they consider changing her to a non-dopamine blocking medication or at least Seroquel should be considered. They recommended she speak with her psychiatrist. They started clonazepam twice daily for symptomatic controls. She was on Topamax in the past twice a day for headaches.  Reviewed notes in Epic. In May 2015 patient reported to the emergency room with slurred speech, tremors, and reported that she could not hold her neck up. Symptoms started for 2 days. She complained of confusion and pain in her back of the head and able to keep her head up. She  also complained of dizziness and blurred vision. She admits been seen in the emergency room the night before. She was taking a lot of psychotropic medications. She reported her left side of her neck was tilted for the past 2 days. She took Abilify which had been started recently. She has a long history of  bipolar disorder and follows with psychiatry at [facility]. The time she had been taking a combination of sertraline, Depakote, Abilify, propranolol and Topamax for a long period of time. No history of substance abuse. It was felt that her symptoms were due to medications and she was given Benadryl. Her medications were adjusted. CT of the head was unremarkable.  Review of Systems: Patient complains of symptoms per HPI as well as the following symptoms: Weight gain, chest pain, trouble swallowing, shortness of breath, cough, wheezing, constipation, urination problems, cramps come aching muscles, feeling hot, increased thirst, memory loss, confusion, headache, numbness, weakness, difficulty swallowing, insomnia, sleepiness, snoring, restless legs, difficulty swallowing, tremor, depression, anxiety, too much sleep, not enough sleep, decreased energy, change in appetite, disinterest in activities, hallucinations, racing thoughts. Pertinent negatives per HPI. All others negative.   Social History   Social History  . Marital status: Single    Spouse name: N/A  . Number of children: 1  . Years of education: 35   Occupational History  . Not on file.   Social History Main Topics  . Smoking status: Current Every Day Smoker    Packs/day: 0.50    Years: 27.00  . Smokeless tobacco: Never Used  . Alcohol use 4.8 - 5.4 oz/week    5 - 6 Cans of beer, 3 Glasses of wine per week     Comment: monthly  . Drug use: No  . Sexual activity: Not on file   Other Topics Concern  . Not on file   Social History Narrative   Lives with boyfriend, [name] (on [place])   Caffeine use: 1 cup coffee a day, 2 soft drinks per day    Family History  Problem Relation Age of Onset  . Bipolar disorder Mother   . Schizophrenia Mother   . Hypertension Mother   . Diabetes Mother   . Cancer Mother   . Bipolar disorder Sister   . Bipolar disorder Brother   . Hypertension Father   . Cancer Father   . Breast cancer  Maternal Aunt   . Breast cancer Paternal Aunt   . Breast cancer Maternal Grandmother   . Breast cancer Paternal Grandmother   . Parkinson's disease Maternal Grandfather   . Tremor Neg Hx     Past Medical History:  Diagnosis Date  . Anxiety   . Bipolar disorder ([place])   . Bursitis   . Depression   . GERD (gastroesophageal reflux disease)   . Headache   . High cholesterol   . Hypertension   . Seizures ([place])     Past Surgical History:  Procedure Laterality Date  . NO PAST SURGERIES      Current Outpatient Prescriptions  Medication Sig Dispense Refill  . albuterol (PROAIR HFA) 108 (90 Base) MCG/ACT inhaler Inhale 2 puffs into the lungs every 4 (four) hours as needed for wheezing or shortness of breath. 8.5 g 0  . atorvastatin (LIPITOR) 20 MG tablet Take 1 tablet (20 mg total) by mouth daily. 90 tablet 0  . Calcium Citrate-Vitamin D (CALCIUM + D PO) Take by mouth.    . divalproex (DEPAKOTE) 500 MG DR tablet Take 500  mg by mouth 3 (three) times daily.    Marland Kitchen lurasidone (LATUDA) 40 MG TABS tablet Take 60 mg by mouth 2 (two) times daily.     . Multiple Vitamin (MULTIVITAMIN) capsule Take 1 capsule by mouth daily.    . Omega-3 Fatty Acids (FISH OIL) 1000 MG CAPS Take by mouth.    . sertraline (ZOLOFT) 100 MG tablet Take 200 mg by mouth daily.    . propranolol (INDERAL) 20 MG tablet Take 1 tablet (20 mg total) by mouth 2 (two) times daily. 60 tablet 6   No current facility-administered medications for this visit.     Allergies as of 03/07/2016 - Review Complete 03/07/2016  Allergen Reaction Noted  . Amitriptyline  04/01/2015    Vitals: BP (!) 151/87 (BP Location: Right Arm, Patient Position: Sitting, Cuff Size: Normal)   Pulse 77   Ht '5\' 2"'$  (1.575 m)   Wt 185 lb 12.8 oz (84.3 kg)   BMI 33.98 kg/m  Last Weight:  Wt Readings from Last 1 Encounters:  03/07/16 185 lb 12.8 oz (84.3 kg)   Last Height:   Ht Readings from Last 1 Encounters:  03/07/16 '5\' 2"'$  (1.575 m)   Physical  exam: Exam: Gen: NAD, conversant, well nourised, obese, well groomed                     CV: RRR, no MRG. No Carotid Bruits. No peripheral edema, warm, nontender Eyes: Conjunctivae clear without exudates or hemorrhage  Neuro: Detailed Neurologic Exam  Speech:    Speech is normal; fluent and spontaneous with normal comprehension.  Cognition:    The patient is oriented to person, place, and time;     recent and remote memory intact;     language fluent;     normal attention, concentration,     fund of knowledge Cranial Nerves:    The pupils are equal, round, and reactive to light. The fundi are normal and spontaneous venous pulsations are present. Visual fields are full to finger confrontation. Extraocular movements are intact. Trigeminal sensation is intact and the muscles of mastication are normal. The face is symmetric. The palate elevates in the midline. Hearing intact. Voice is normal. Shoulder shrug is normal. The tongue has normal motion without fasciculations.   Coordination:    Normal finger to nose and heel to shin. Normal rapid alternating movements.   Gait:    Heel-toe and tandem gait are normal.   Motor Observation:    No asymmetry, no atrophy, and no involuntary movements noted. Tone:    Normal muscle tone.    Posture:    Posture is normal. normal erect    Strength:    Strength is V/V in the upper and lower limbs.      Sensation: intact to LT     Reflex Exam:  DTR's:    Deep tendon reflexes in the upper and lower extremities are normal bilaterally.   Toes:    The toes are downgoing bilaterally.   Clonus:    Clonus is absent.       Assessment/Plan:  41 year old with tremors likely secondary to long history of psychotropic dopamine-blocking or other drugs. Essential tremor also a possibility so will trial propranolol. MRI of the brain to evaluate for tremor as well as staring spells patient reports which may be convulsions/ seizures but unlikely. If  continues can also perform an eeg. Will check iron studies for RLS. Will check labs for the tremor, tsh was  normal.  Orders Placed This Encounter  Procedures  . MR BRAIN W WO CONTRAST    Standing Status:   Future    Standing Expiration Date:   09/04/2017    Order Specific Question:   Preferred imaging location?    Answer:   External    Order Specific Question:   Does the patient have a pacemaker or implanted devices?    Answer:   No    Order Specific Question:   What is the patient's sedation requirement?    Answer:   No Sedation    Order Specific Question:   Reason for Exam (SYMPTOM  OR DIAGNOSIS REQUIRED)    Answer:   tremor and staring spells  . Heavy metals, blood  . Copper, serum  . Ceruloplasmin  . Iron, TIBC and Ferritin Panel  . CBC    Standing Status:   Future    Standing Expiration Date:   03/08/2017  . Basic Metabolic Panel   Cc: Mcgowan, Wallis Mart, MD  [facility] [address] [place], [place] [address]  Phone [phone] Fax [phone]

## 2016-03-07 NOTE — Patient Instructions (Addendum)
Remember to drink plenty of fluid, eat healthy meals and do not skip any meals. Try to eat protein with a every meal and eat a healthy snack such as fruit or nuts in between meals. Try to keep a regular sleep-wake schedule and try to exercise daily, particularly in the form of walking, 20-30 minutes a day, if you can.   As far as your medications are concerned, I would like to suggest: Propranolol '20mg'$  twice a day  As far as diagnostic testing: MRI brain w/wo contrast, labs.   Our phone number is [phone]. We also have an after hours call service for urgent matters and there is a physician on-call for urgent questions. For any emergencies you know to call 911 or go to the nearest emergency room  Essential Tremor Introduction A tremor is trembling or shaking that you cannot control. Most tremors affect the hands or arms. Tremors can also affect the head, vocal cords, face, and other parts of the body. Essential tremor is a tremor without a known cause. What are the causes? Essential tremor has no known cause. What increases the risk? You may be at greater risk of essential tremor if:  You have a family member with essential tremor.  You are age 43 or older.  You take certain medicines. What are the signs or symptoms? The main sign of a tremor is uncontrolled and unintentional rhythmic shaking of a body part.  You may have difficulty eating with a spoon or fork.  You may have difficulty writing.  You may nod your head up and down or side to side.  You may have a quivering voice. Your tremors:  May get worse over time.  May come and go.  May be more noticeable on one side of your body.  May get worse due to stress, fatigue, caffeine, and extreme heat or cold. How is this diagnosed? Your health care provider can diagnose essential tremor based on your symptoms, medical history, and a physical examination. There is no single test to diagnose an essential tremor. However, your  health care provider may perform a variety of tests to rule out other conditions. Tests may include:  Blood and urine tests.  Imaging studies of your brain, such as:  CT scan.  MRI.  A test that measures involuntary muscle movement (electromyogram). How is this treated? Your tremors may go away without treatment. Mild tremors may not need treatment if they do not affect your day-to-day life. Severe tremors may need to be treated using one or a combination of the following options:  Medicines. This may include medicine that is injected.  Lifestyle changes.  Physical therapy. Follow these instructions at home:  Take medicines only as directed by your health care provider.  Limit alcohol intake to no more than 1 drink per day for nonpregnant women and 2 drinks per day for men. One drink equals 12 oz of beer, 5 oz of wine, or 1 oz of hard liquor.  Do not use any tobacco products, including cigarettes, chewing tobacco, or electronic cigarettes. If you need help quitting, ask your health care provider.  Take medicines only as directed by your health care provider.  Avoid extreme heat or cold.  Limit the amount of caffeine you consumeas directed by your health care provider.  Try to get eight hours of sleep each night.  Find ways to manage your stress, such as meditation or yoga.  Keep all follow-up visits as directed by your health care provider. This  is important. This includes any physical therapy visits. Contact a health care provider if:  You experience any changes in the location or intensity of your tremors.  You start having a tremor after starting a new medicine.  You have tremor with other symptoms such as:  Numbness.  Tingling.  Pain.  Weakness.  Your tremor gets worse.  Your tremor interferes with your daily life. This information is not intended to replace advice given to you by your health care provider. Make sure you discuss any questions you have  with your health care provider. Document Released: 02/12/2014 Document Revised: 06/30/2015 Document Reviewed: 07/20/2013  2017 Elsevier Propranolol tablets What is this medicine? PROPRANOLOL (proe PRAN oh lole) is a beta-blocker. Beta-blockers reduce the workload on the heart and help it to beat more regularly. This medicine is used to treat high blood pressure, to control irregular heart rhythms (arrhythmias) and to relieve chest pain caused by angina. It may also be helpful after a heart attack. This medicine is also used to prevent migraine headaches, relieve uncontrollable shaking (tremors), and help certain problems related to the thyroid gland and adrenal gland. This medicine may be used for other purposes; ask your health care provider or pharmacist if you have questions. COMMON BRAND NAME(S): Inderal What should I tell my health care provider before I take this medicine? They need to know if you have any of these conditions: -circulation problems or blood vessel disease -diabetes -history of heart attack or heart disease, vasospastic angina -kidney disease -liver disease -lung or breathing disease, like asthma or emphysema -pheochromocytoma -slow heart rate -thyroid disease -an unusual or allergic reaction to propranolol, other beta-blockers, medicines, foods, dyes, or preservatives -pregnant or trying to get pregnant -breast-feeding How should I use this medicine? Take this medicine by mouth with a glass of water. Follow the directions on the prescription label. Take your doses at regular intervals. Do not take your medicine more often than directed. Do not stop taking except on your the advice of your doctor or health care professional. Talk to your pediatrician regarding the use of this medicine in children. Special care may be needed. Overdosage: If you think you have taken too much of this medicine contact a poison control center or emergency room at once. NOTE: This medicine  is only for you. Do not share this medicine with others. What if I miss a dose? If you miss a dose, take it as soon as you can. If it is almost time for your next dose, take only that dose. Do not take double or extra doses. What may interact with this medicine? Do not take this medicine with any of the following medications: -feverfew -phenothiazines like chlorpromazine, mesoridazine, prochlorperazine, thioridazine This medicine may also interact with the following medications: -aluminum hydroxide gel -antipyrine -antiviral medicines for HIV or AIDS -barbiturates like phenobarbital -certain medicines for blood pressure, heart disease, irregular heart beat -cimetidine -ciprofloxacin -diazepam -fluconazole -haloperidol -isoniazid -medicines for cholesterol like cholestyramine or colestipol -medicines for mental depression -medicines for migraine headache like almotriptan, eletriptan, frovatriptan, naratriptan, rizatriptan, sumatriptan, zolmitriptan -NSAIDs, medicines for pain and inflammation, like ibuprofen or naproxen -phenytoin -rifampin -teniposide -theophylline -thyroid medicines -tolbutamide -warfarin -zileuton This list may not describe all possible interactions. Give your health care provider a list of all the medicines, herbs, non-prescription drugs, or dietary supplements you use. Also tell them if you smoke, drink alcohol, or use illegal drugs. Some items may interact with your medicine. What should I watch for while using  this medicine? Visit your doctor or health care professional for regular check ups. Check your blood pressure and pulse rate regularly. Ask your health care professional what your blood pressure and pulse rate should be, and when you should contact them. You may get drowsy or dizzy. Do not drive, use machinery, or do anything that needs mental alertness until you know how this drug affects you. Do not stand or sit up quickly, especially if you are an  older patient. This reduces the risk of dizzy or fainting spells. Alcohol can make you more drowsy and dizzy. Avoid alcoholic drinks. This medicine can affect blood sugar levels. If you have diabetes, check with your doctor or health care professional before you change your diet or the dose of your diabetic medicine. Do not treat yourself for coughs, colds, or pain while you are taking this medicine without asking your doctor or health care professional for advice. Some ingredients may increase your blood pressure. What side effects may I notice from receiving this medicine? Side effects that you should report to your doctor or health care professional as soon as possible: -allergic reactions like skin rash, itching or hives, swelling of the face, lips, or tongue -breathing problems -changes in blood sugar -cold hands or feet -difficulty sleeping, nightmares -dry peeling skin -hallucinations -muscle cramps or weakness -slow heart rate -swelling of the legs and ankles -vomiting Side effects that usually do not require medical attention (report to your doctor or health care professional if they continue or are bothersome): -change in sex drive or performance -diarrhea -dry sore eyes -hair loss -nausea -weak or tired This list may not describe all possible side effects. Call your doctor for medical advice about side effects. You may report side effects to FDA at 1-800-FDA-1088. Where should I keep my medicine? Keep out of the reach of children. Store at room temperature between 15 and 30 degrees C (59 and 86 degrees F). Protect from light. Throw away any unused medicine after the expiration date. NOTE: This sheet is a summary. It may not cover all possible information. If you have questions about this medicine, talk to your doctor, pharmacist, or health care provider.  2017 Elsevier/Gold Standard (2012-09-26 14:51:53)

## 2016-03-09 ENCOUNTER — Telehealth: Payer: Self-pay | Admitting: *Deleted

## 2016-03-09 LAB — HEAVY METALS, BLOOD
Arsenic: 8 ug/L (ref 2–23)
Lead, Blood: NOT DETECTED ug/dL (ref 0–19)
Mercury: NOT DETECTED ug/L (ref 0.0–14.9)

## 2016-03-09 LAB — BASIC METABOLIC PANEL
BUN/Creatinine Ratio: 11 (ref 9–23)
BUN: 8 mg/dL (ref 6–24)
CO2: 27 mmol/L (ref 18–29)
Calcium: 9.5 mg/dL (ref 8.7–10.2)
Chloride: 96 mmol/L (ref 96–106)
Creatinine, Ser: 0.75 mg/dL (ref 0.57–1.00)
GFR calc Af Amer: 114 mL/min/{1.73_m2} (ref >59)
GFR calc non Af Amer: 99 mL/min/{1.73_m2} (ref >59)
Glucose: 97 mg/dL (ref 65–99)
Potassium: 4.8 mmol/L (ref 3.5–5.2)
Sodium: 135 mmol/L (ref 134–144)

## 2016-03-09 LAB — IRON AND TIBC
Iron Saturation: 20 % (ref 15–55)
Iron: 75 ug/dL (ref 27–159)
Total Iron Binding Capacity: 371 ug/dL (ref 250–450)
UIBC: 296 ug/dL (ref 131–425)

## 2016-03-09 LAB — FERRITIN: Ferritin: 78 ng/mL (ref 15–150)

## 2016-03-09 LAB — COPPER, SERUM: Copper: 109 ug/dL (ref 72–166)

## 2016-03-09 LAB — CERULOPLASMIN: Ceruloplasmin: 32.7 mg/dL (ref 19.0–39.0)

## 2016-03-09 NOTE — Telephone Encounter (Signed)
LVM for pt about normal labs per AA,MD note. Gave [facility] phone number if she has further questions or concerns.

## 2016-03-09 NOTE — Telephone Encounter (Signed)
-----   Message from Melvenia Beam, MD sent at 03/09/2016  9:28 AM EST ----- Labs normal

## 2016-03-12 ENCOUNTER — Telehealth: Payer: Self-pay | Admitting: Neurology

## 2016-03-12 NOTE — Telephone Encounter (Signed)
Patient called in reference to propranolol (INDERAL) 20 MG tablet.  Patient states medication is not working for her tremors.  Please call

## 2016-03-12 NOTE — Telephone Encounter (Signed)
Pt was seen as a new eval for tremors on 03/07/16. She has a hx of migraines, depression/anxiety and bipolar d/o. Tremors in the past were felt to be secondary to psychotropic meds. Also has family hx of PD in her maternal grandfather. Lab results were within normal limits. MRI has not yet been scheduled.  Talked to pt who says that she started propanolol 20 mg BID for tremors on the same day as her appt. However, she has not noticed any improvement in her symptoms. Let her know that MRI coordinator should be in touch soon to schedule MRI.

## 2016-03-13 NOTE — Telephone Encounter (Signed)
Called pt back. Says that she doesn't think her med coverage program would carry/cover amantadine ER but she would like to try artane. Reviewed possible side effects and pt agreed to call w/ updates or problems.  Instructed to stop propanolol when starting new med. Verbalized understanding and appreciation for call.

## 2016-03-13 NOTE — Telephone Encounter (Signed)
Let's try something different. We can try something called amantadine ER however I am not sure insurance will pay for it otherwise we can try artane. Side effects are below, if she is willing let me know and I can call it in thanks.  What side effects may I notice from receiving this medicine? Side effects that you should report to your doctor or health care professional as soon as possible: -allergic reactions like skin rash, itching or hives, swelling of the face, lips, or tongue -anxiety -breathing problems -changes in vision -color changes on the skin -confusion -depressed mood -eye pain -falling asleep during normal activities like driving -hallucination, loss of contact with reality -new or increased gambling urges, sexual urges, uncontrolled spending, binge or compulsive eating, or other urges -seizures -signs and symptoms of low blood pressure like dizziness; feeling faint or lightheaded, falls; unusually weak or tired -swelling in your legs and feet -suicidal thoughts or other mood changes -trouble passing urine or change in the amount of urine -trouble sleeping -uncontrolled movements of the mouth, head, hands, feet, shoulders, eyelids or other unusual muscle movements Side effects that usually do not require medical attention (report these to your doctor or health care professional if they continue or are bothersome): -constipation -dizziness -drowsiness -dry mouth -headache -nausea This list may not describe all possible side effects. Call your doctor for medical advice about side effects. You may report side effects to FDA at 1-800-FDA-1088.

## 2016-03-14 ENCOUNTER — Other Ambulatory Visit: Payer: Self-pay | Admitting: Neurology

## 2016-03-14 MED ORDER — TRIHEXYPHENIDYL HCL 2 MG PO TABS: 2.0000 mg | ORAL_TABLET | Freq: Every day | ORAL | 6 refills | Status: DC

## 2016-03-22 ENCOUNTER — Telehealth: Payer: Self-pay | Admitting: Neurology

## 2016-03-22 NOTE — Telephone Encounter (Signed)
Patient called office in reference to trihexyphenidyl (ARTANE) 2 MG tablet.  Patient would like to know reason why she was given this medication if its for parkinson's disease.  Please call

## 2016-03-22 NOTE — Telephone Encounter (Signed)
Spoke to patient - she is aware of Dr. Cathren Laine response and verbalized understanding.

## 2016-03-22 NOTE — Telephone Encounter (Signed)
Absolutely not parkinson's disease. Tremor may be medication related due to previous dopamine blockers. Artane is used in treating tremors secondary to this type of medication side effects. thanks

## 2016-04-16 ENCOUNTER — Telehealth: Payer: Self-pay | Admitting: Pharmacy Technician

## 2016-04-16 NOTE — Telephone Encounter (Signed)
Patient provide poi.  Patient will receive medication assistance through 2018, as long as patient's household income does not exceed 250% FPL or pt does not obtain prescription coverage.  [facility]

## 2016-04-30 ENCOUNTER — Telehealth: Payer: Self-pay | Admitting: Pharmacist

## 2016-04-30 NOTE — Telephone Encounter (Signed)
Faxed PAP application to [facility] for processing Latuda '60mg'$  Take one tablet by mouth two times a day with food. Change in directions.

## 2016-05-04 ENCOUNTER — Ambulatory Visit: Payer: Self-pay

## 2016-05-08 ENCOUNTER — Telehealth: Payer: Self-pay | Admitting: Nurse Practitioner

## 2016-05-08 NOTE — Telephone Encounter (Signed)
Pt called to schedule appt. Returned her call

## 2016-05-15 ENCOUNTER — Ambulatory Visit: Payer: Self-pay

## 2016-05-24 ENCOUNTER — Ambulatory Visit: Payer: Self-pay

## 2016-05-29 ENCOUNTER — Ambulatory Visit: Payer: Self-pay | Admitting: Adult Health Nurse Practitioner

## 2016-05-29 ENCOUNTER — Other Ambulatory Visit: Payer: Self-pay

## 2016-05-29 VITALS — BP 144/86 | HR 71 | Temp 98.0°F | Wt 185.5 lb

## 2016-05-29 DIAGNOSIS — E785 Hyperlipidemia, unspecified: Secondary | ICD-10-CM

## 2016-05-29 MED ORDER — ALBUTEROL SULFATE HFA 108 (90 BASE) MCG/ACT IN AERS: INHALATION_SPRAY | RESPIRATORY_TRACT | 0 refills | Status: DC

## 2016-05-29 MED ORDER — ATORVASTATIN CALCIUM 20 MG PO TABS: 20.0000 mg | ORAL_TABLET | Freq: Every day | ORAL | 0 refills | Status: DC

## 2016-05-29 MED ORDER — LISINOPRIL 10 MG PO TABS: 10.0000 mg | ORAL_TABLET | Freq: Every day | ORAL | 3 refills | Status: DC

## 2016-05-29 NOTE — Addendum Note (Signed)
Addended by: Meda Klinefelter D on: 05/29/2016 08:16 PM   Modules accepted: Orders

## 2016-05-29 NOTE — Addendum Note (Signed)
Addended by: Lurena Nida D on: 05/29/2016 08:19 PM   Modules accepted: Orders

## 2016-05-29 NOTE — Progress Notes (Signed)
  Patient: Vanessa Baker Female    DOB: Oct 14, 1975   41 y.o.   MRN: [id number] Visit Date: 05/29/2016  Today's Provider: Staci Acosta, NP   Chief Complaint  Patient presents with  . Follow-up   Subjective:    HPI   Pt states that she is concerned about her BP.  States that it has been up "off and on" for about 6 months.  Pt states that she has slight dizziness the past couple of days.   Last LDL 99 in Oct 2017.  Has been out of Lipitor for 2 weeks.  Trying to modify diet, no exercise.      Managed by [facility] for psychiatric diagnosis.   Allergies  Allergen Reactions  . Amitriptyline     Confusion, paralysis   Previous Medications   ALBUTEROL (PROAIR HFA) 108 (90 BASE) MCG/ACT INHALER    Inhale 2 puffs into the lungs every 4 (four) hours as needed for wheezing or shortness of breath.   ATORVASTATIN (LIPITOR) 20 MG TABLET    Take 1 tablet (20 mg total) by mouth daily.   CALCIUM CITRATE-VITAMIN D (CALCIUM + D PO)    Take by mouth.   DIVALPROEX (DEPAKOTE) 500 MG DR TABLET    Take 500 mg by mouth 3 (three) times daily.   LURASIDONE (LATUDA) 40 MG TABS TABLET    Take 60 mg by mouth 2 (two) times daily.    MULTIPLE VITAMIN (MULTIVITAMIN) CAPSULE    Take 1 capsule by mouth daily.   OMEGA-3 FATTY ACIDS (FISH OIL) 1000 MG CAPS    Take by mouth.   PROPRANOLOL (INDERAL) 20 MG TABLET    Take 1 tablet (20 mg total) by mouth 2 (two) times daily.   SERTRALINE (ZOLOFT) 100 MG TABLET    Take 200 mg by mouth daily.   TRIHEXYPHENIDYL (ARTANE) 2 MG TABLET    Take 1 tablet (2 mg total) by mouth daily.    Review of Systems  All other systems reviewed and are negative.   Social History  Substance Use Topics  . Smoking status: Current Every Day Smoker    Packs/day: 0.50    Years: 27.00  . Smokeless tobacco: Never Used  . Alcohol use 4.8 - 5.4 oz/week    5 - 6 Cans of beer, 3 Glasses of wine per week     Comment: monthly   Objective:   BP (!) 144/86   Pulse 71   Temp 98 F  (36.7 C)   Wt 185 lb 8 oz (84.1 kg)   BMI 33.93 kg/m   Physical Exam  Constitutional: She appears well-developed and well-nourished.  HENT:  Head: Normocephalic and atraumatic.  Eyes: Pupils are equal, round, and reactive to light.  Neck: Normal range of motion.  Cardiovascular: Normal rate, regular rhythm and normal heart sounds.   Pulmonary/Chest: Effort normal and breath sounds normal.  Abdominal: Soft. Bowel sounds are normal.  Skin: Skin is warm and dry.  Nursing note and vitals reviewed.       Assessment & Plan:         HLD:  Check lipid panel today.  Controlled.  Continue current regimen.  Encourage low cholesterol, low fat diet and exercise.   HTN:  Newly diagnosed.  Start Lisinopril '10mg'$  daily.  FU in 4 weeks for BP check and BMP.   Encouraged smoking cessation.    Staci Acosta, NP   [facility]

## 2016-05-30 ENCOUNTER — Telehealth: Payer: Self-pay

## 2016-05-30 ENCOUNTER — Ambulatory Visit: Payer: Self-pay

## 2016-05-30 LAB — COMPREHENSIVE METABOLIC PANEL
ALT: 29 IU/L (ref 0–32)
AST: 24 IU/L (ref 0–40)
Albumin/Globulin Ratio: 1.8 (ref 1.2–2.2)
Albumin: 4.4 g/dL (ref 3.5–5.5)
Alkaline Phosphatase: 60 IU/L (ref 39–117)
BUN/Creatinine Ratio: 21 (ref 9–23)
BUN: 15 mg/dL (ref 6–24)
Bilirubin Total: <0.2 mg/dL (ref 0.0–1.2)
CO2: 24 mmol/L (ref 18–29)
Calcium: 10.1 mg/dL (ref 8.7–10.2)
Chloride: 93 mmol/L — ABNORMAL LOW (ref 96–106)
Creatinine, Ser: 0.7 mg/dL (ref 0.57–1.00)
GFR calc Af Amer: 124 mL/min/{1.73_m2} (ref >59)
GFR calc non Af Amer: 107 mL/min/{1.73_m2} (ref >59)
Globulin, Total: 2.5 g/dL (ref 1.5–4.5)
Glucose: 91 mg/dL (ref 65–99)
Potassium: 4.3 mmol/L (ref 3.5–5.2)
Sodium: 138 mmol/L (ref 134–144)
Total Protein: 6.9 g/dL (ref 6.0–8.5)

## 2016-05-30 NOTE — Telephone Encounter (Signed)
Received PAP application from [facility] for ProAir HFA placed for provider to sign.

## 2016-05-30 NOTE — Telephone Encounter (Signed)
Placed signed application/script in [facility] folder for pickup. 

## 2016-06-05 ENCOUNTER — Ambulatory Visit: Payer: Self-pay | Admitting: Neurology

## 2016-06-12 ENCOUNTER — Ambulatory Visit: Payer: Self-pay

## 2016-06-12 ENCOUNTER — Telehealth: Payer: Self-pay | Admitting: Pharmacist

## 2016-06-12 NOTE — Telephone Encounter (Signed)
07/10/66 Faxing [employer] application for ProAir Inhale 2 puffs every 4 hours.

## 2016-06-13 ENCOUNTER — Ambulatory Visit: Payer: Self-pay

## 2016-06-20 ENCOUNTER — Ambulatory Visit: Payer: Self-pay

## 2016-06-22 ENCOUNTER — Telehealth: Payer: Self-pay | Admitting: Pharmacist

## 2016-06-22 NOTE — Telephone Encounter (Signed)
06/22/16 Faxed a refill request to [facility] for refill on Latuda 60 mg 1 twice daily.

## 2016-06-26 ENCOUNTER — Ambulatory Visit: Payer: Self-pay

## 2016-07-04 ENCOUNTER — Encounter: Payer: Self-pay | Admitting: Neurology

## 2016-07-04 ENCOUNTER — Ambulatory Visit (INDEPENDENT_AMBULATORY_CARE_PROVIDER_SITE_OTHER): Payer: Self-pay | Admitting: Neurology

## 2016-07-04 VITALS — BP 121/65 | HR 65 | Ht 62.0 in | Wt 182.6 lb

## 2016-07-04 DIAGNOSIS — G251 Drug-induced tremor: Secondary | ICD-10-CM

## 2016-07-04 MED ORDER — TRIHEXYPHENIDYL HCL 2 MG PO TABS
2.0000 mg | ORAL_TABLET | Freq: Three times a day (TID) | ORAL | 6 refills | Status: DC
Start: 2016-07-04 — End: 2017-04-02

## 2016-07-04 NOTE — Progress Notes (Signed)
[facility]   Provider:  Dr Jaynee Eagles Referring Provider: Zara Council Primary Care Physician:  Zara Council   CC:  tremor  HPI:  Vanessa Baker is a 41 y.o. female here as a referral from Dr. No ref. provider found for tremors specifically parkinson's disease. Past medical history migraine, depression, anxiety, cholesterol, bipolar disorder, bursitis, IBS. She is already been evaluated by neurology in the past and the tremors were diagnosed as secondary to psychotropic medications. Everything shakes. Started years ago. Here with her father who also provides information. Symptoms are worsening. Started 5-8 years ago. It started in her hands first. Now everything shakes including the head, legs, hands. Started in the left hand. Now even her legs shake and her feet shake. She can even feel her eyes shake. Continuous and never stops. If she gets upset it gets worse. Nothing makes it better. Mother has tremors unknown why. Maternal grandfather had parkinson's disease. Worse with using a spoon and drinking, worse with use. Feels her head shaking. No changes in voice. No difficulty walking but hurts to walk due to bursitis. She has fallen after stepping on her leg and her leg gave out and she fell. She also reports restless legs at night 2-3x a week. Tremors are getting worse. She has "staring spells" without loss of consciousness or loss of awareness.   Reviewed notes, labs and imaging from outside physicians, which showed:   LDL 99, cholesterol 173, triglycerides 207, A1c 5.4, BUN 11, creatinine 0.8 11/10/2015. TSH 2.550 12/09/2014  CT head 06/2013: Personally reviewed images and agree with the following:  FINDINGS:  There is no evidence of mass effect, midline shift or extra-axial  fluid collections. There is no evidence of a space-occupying lesion  or intracranial hemorrhage. There is no evidence of a cortical-based  area of acute infarction.   The  ventricles and sulci are appropriate for the patient's age. The  basal cisterns are patent.   Visualized portions of the orbits are unremarkable. The visualized  portions of the paranasal sinuses and mastoid air cells are  unremarkable.   The osseous structures are unremarkable.   IMPRESSION:  Normal CT of the brain without intravenous contrast.    Electronically Signed  By: Kathreen Devoid  On: 06/10/2013 18:51   Reviewed notes from [facility]. Patient actually has been seen by neurology for tremors. She was seen in 2015 in neurology. She reported tremors in both hands the left hand is worse. Happens with rest and activity. Her hands will jerk while she is typing. Tremor is constant. Severe interferes with eating, drinking and holding objects. She drops objects. Being upset or stress makes the tremor worse. She has a hard time putting makeup on and putting sugar under coffee without making a mess. Concern for tremor was due to antipsychotic medication exposure. Essential tremor is also on the differential diagnosis in the setting of chronic antipsychotic use they consider changing her to a non-dopamine blocking medication or at least Seroquel should be considered. They recommended she speak with her psychiatrist. They started clonazepam twice daily for symptomatic controls. She was on Topamax in the past twice a day for headaches.  Reviewed notes in Epic. In May 2015 patient reported to the emergency room with slurred speech, tremors, and reported that she could not hold her neck up. Symptoms started for 2 days. She complained of confusion and pain in her back of the head and able to keep her head up. She also complained of dizziness and blurred  vision. She admits been seen in the emergency room the night before. She was taking a lot of psychotropic medications. She reported her left side of her neck was tilted for the past 2 days. She took Abilify which had been started recently. She has a long  history of bipolar disorder and follows with psychiatry at [facility]. The time she had been taking a combination of sertraline, Depakote, Abilify, propranolol and Topamax for a long period of time. No history of substance abuse. It was felt that her symptoms were due to medications and she was given Benadryl. Her medications were adjusted. CT of the head was unremarkable.  Review of Systems: Patient complains of symptoms per HPI as well as the following symptoms: Weight gain, chest pain, trouble swallowing, shortness of breath, cough, wheezing, constipation, urination problems, cramps come aching muscles, feeling hot, increased thirst, memory loss, confusion, headache, numbness, weakness, difficulty swallowing, insomnia, sleepiness, snoring, restless legs, difficulty swallowing, tremor, depression, anxiety, too much sleep, not enough sleep, decreased energy, change in appetite, disinterest in activities, hallucinations, racing thoughts. Pertinent negatives per HPI. All others negative.  Social History   Social History  . Marital status: Single    Spouse name: N/A  . Number of children: 1  . Years of education: 28   Occupational History  . Not on file.   Social History Main Topics  . Smoking status: Current Every Day Smoker    Packs/day: 0.50    Years: 27.00  . Smokeless tobacco: Never Used  . Alcohol use 4.8 - 5.4 oz/week    5 - 6 Cans of beer, 3 Glasses of wine per week     Comment: monthly  . Drug use: No  . Sexual activity: Not on file   Other Topics Concern  . Not on file   Social History Narrative   Lives with boyfriend, [name] (on [place])   Caffeine use: 1 cup coffee a day, 2 soft drinks per day    Family History  Problem Relation Age of Onset  . Bipolar disorder Mother   . Schizophrenia Mother   . Hypertension Mother   . Diabetes Mother   . Cancer Mother   . Bipolar disorder Sister   . Bipolar disorder Brother   . Hypertension Father   . Cancer Father   .  Breast cancer Maternal Aunt   . Breast cancer Paternal Aunt   . Breast cancer Maternal Grandmother   . Breast cancer Paternal Grandmother   . Parkinson's disease Maternal Grandfather   . Tremor Neg Hx     Past Medical History:  Diagnosis Date  . Anxiety   . Bipolar disorder ([place])   . Bursitis   . Depression   . GERD (gastroesophageal reflux disease)   . Headache   . High cholesterol   . Hypertension   . Seizures ([place])     Past Surgical History:  Procedure Laterality Date  . NO PAST SURGERIES      Current Outpatient Prescriptions  Medication Sig Dispense Refill  . albuterol (PROAIR HFA) 108 (90 Base) MCG/ACT inhaler Inhale 2 puffs into the lungs every 4 (four) hours as needed for wheezing or shortness of breath. 8.5 g 0  . atorvastatin (LIPITOR) 20 MG tablet Take 1 tablet (20 mg total) by mouth daily. 90 tablet 0  . Calcium Citrate-Vitamin D (CALCIUM + D PO) Take by mouth.    . divalproex (DEPAKOTE) 500 MG DR tablet Take 500 mg by mouth 3 (three) times daily.    Marland Kitchen  lisinopril (PRINIVIL,ZESTRIL) 10 MG tablet Take 1 tablet (10 mg total) by mouth daily. 30 tablet 3  . lurasidone (LATUDA) 40 MG TABS tablet Take 60 mg by mouth 2 (two) times daily.     . Multiple Vitamin (MULTIVITAMIN) capsule Take 1 capsule by mouth daily.    . Omega-3 Fatty Acids (FISH OIL) 1000 MG CAPS Take by mouth.    . propranolol (INDERAL) 20 MG tablet Take 1 tablet (20 mg total) by mouth 2 (two) times daily. 60 tablet 6  . sertraline (ZOLOFT) 100 MG tablet Take 200 mg by mouth daily.    . trihexyphenidyl (ARTANE) 2 MG tablet Take 1 tablet (2 mg total) by mouth daily. 30 tablet 6   No current facility-administered medications for this visit.     Allergies as of 07/04/2016 - Review Complete 05/29/2016  Allergen Reaction Noted  . Amitriptyline  04/01/2015    Vitals: There were no vitals taken for this visit. Last Weight:  Wt Readings from Last 1 Encounters:  05/29/16 185 lb 8 oz (84.1 kg)   Last  Height:   Ht Readings from Last 1 Encounters:  03/07/16 5\' 2"  (1.575 m)    Physical exam: Exam: Gen: NAD, conversant, well nourised, obese, well groomed                     CV: RRR, no MRG. No Carotid Bruits. No peripheral edema, warm, nontender Eyes: Conjunctivae clear without exudates or hemorrhage  Neuro: Detailed Neurologic Exam  Speech:    Speech is normal; fluent and spontaneous with normal comprehension.  Cognition:    The patient is oriented to person, place, and time;     recent and remote memory intact;     language fluent;     normal attention, concentration,     fund of knowledge Cranial Nerves:    The pupils are equal, round, and reactive to light. The fundi are normal and spontaneous venous pulsations are present. Visual fields are full to finger confrontation. Extraocular movements are intact. Trigeminal sensation is intact and the muscles of mastication are normal. The face is symmetric. The palate elevates in the midline. Hearing intact. Voice is normal. Shoulder shrug is normal. The tongue has normal motion without fasciculations.   Coordination:    Normal finger to nose and heel to shin. Normal rapid alternating movements.   Gait:    Heel-toe and tandem gait are normal.   Motor Observation:    No asymmetry, no atrophy, and no involuntary movements noted. Tone:    Normal muscle tone.    Posture:    Posture is normal. normal erect    Strength:    Strength is V/V in the upper and lower limbs.      Sensation: intact to LT     Reflex Exam:  DTR's:    Deep tendon reflexes in the upper and lower extremities are normal bilaterally.   Toes:    The toes are downgoing bilaterally.   Clonus:    Clonus is absent.       Assessment/Plan:  41 year old with tremors likely secondary to long history of psychotropic dopamine-blocking or other drugs. Essential tremor also a possibility so will trial propranolol. MRI of the brain to evaluate for tremor  as well as staring spells patient reports which may be convulsions/ seizures but unlikely. If continues can also perform an eeg. Will check iron studies for RLS. Will check labs for the tremor, tsh was normal.   -  propranolol tried, amantadine ER was not covered by insurance,  Artane was started at 2mg  daily to titrate up to 5-15mg /day PO divided tid.  - increase Artane. Discussed risks. Also discussed MRI of the brain which I will reorder. An MRI of the brain is negative for any other causes of her extrapyramidal symptoms we will order a DAT scan however I really think Parkinson's disease is unlikely and this is due to drug-induced parkinsonism.  Anticholinergics including trihexyphenidyl, benztropine, amantadine, and levodopa have been empirically tested for their ability to relieve symptoms of DIP, but this has produced no clear evidence of their effects in DIP [phone] DIP usually resolves within weeks to months after stopping the offending drug; however, parkinsonism may persist or progress in 10-50% of patients   Sarina Ill, MD  [facility] [address] [place], [place] [address]  Phone [phone] Fax [phone]  A total of 35 minutes was spent face-to-face with this patient. Over half this time was spent on counseling patient on the tremor diagnosis and different diagnostic and therapeutic options available.

## 2016-07-04 NOTE — Patient Instructions (Signed)
Trihexyphenidyl tablets  What is this medicine? TRIHEXYPHENIDYL (trye hex ee FEN i dil) is for Parkinsonism or for movement problems caused by certain drugs. This medicine may be used for other purposes; ask your health care provider or pharmacist if you have questions. COMMON BRAND NAME(S): Artane What should I tell my health care provider before I take this medicine? They need to know if you have any of these conditions: -glaucoma -heart disease -high blood pressure -kidney disease -liver disease -prostate problems -an unusual or allergic reaction to trihexyphenidyl, other medicines, lactose, foods, dyes, or preservatives -pregnant or trying to get pregnant -breast-feeding How should I use this medicine? Take this medicine by mouth with a full glass of water. Follow the directions on the prescription label. Take your medicine at regular intervals. Do not take your medicine more often than directed. Do not suddenly stop taking your medicine because you may develop a severe reaction. Talk to your pediatrician regarding the use of this medicine in children. Special care may be needed. Patients over 61 years old may have a stronger reaction and need a smaller dose. Overdosage: If you think you have taken too much of this medicine contact a poison control center or emergency room at once. NOTE: This medicine is only for you. Do not share this medicine with others. What if I miss a dose? If you miss a dose, take it as soon as you can. If it is almost time for your next dose, take only that dose. Do not take double or extra doses. What may interact with this medicine? -benztropine -drugs for bladder problems -drugs for breathing problems like ipratropium and tiotropium -drugs for certain stomach or intestine problems like propantheline, homatropine methylbromide, glycopyrrolate, atropine, belladonna, and dicyclomine -levodopa -scopolamine This list may not describe all possible  interactions. Give your health care provider a list of all the medicines, herbs, non-prescription drugs, or dietary supplements you use. Also tell them if you smoke, drink alcohol, or use illegal drugs. Some items may interact with your medicine. What should I watch for while using this medicine? Your doctor or health care professional may want you to have eye exams while you are taking this medicine. Your mouth may get dry. Chewing sugarless gum or sucking hard candy, and drinking plenty of water may help. Contact your doctor if the problem does not go away or is severe. This medicine may cause dry eyes and blurred vision. If you wear contact lenses you may feel some discomfort. Lubricating drops may help. See your eye doctor if the problem does not go away or is severe. You may get drowsy or dizzy. Do not drive, use machinery, or do anything that needs mental alertness until you know how this medicine affects you. Do not stand or sit up quickly, especially if you are an older patient. This reduces the risk of dizzy or fainting spells. Alcohol may interfere with the effect of this medicine. Avoid alcoholic drinks. What side effects may I notice from receiving this medicine? Side effects that you should report to your doctor or health care professional as soon as possible: -allergic reactions like skin rash, itching or hives, swelling of the face, lips, or tongue -changes in vision -fast or irregular heartbeat -hallucinations -vomiting Side effects that usually do not require medical attention (report to your doctor or health care professional if they continue or are bothersome): -anxiety or nervousness -dizziness -drowsiness -nausea This list may not describe all possible side effects. Call your doctor for medical  advice about side effects. You may report side effects to FDA at 1-800-FDA-1088. Where should I keep my medicine? Keep out of the reach of children. Store at room temperature between  15 and 30 degrees C (59 and 86 degrees F). Throw away any unused medicine after the expiration date. NOTE: This sheet is a summary. It may not cover all possible information. If you have questions about this medicine, talk to your doctor, pharmacist, or health care provider.  2018 Elsevier/Gold Standard (2007-10-09 11:19:06)

## 2016-07-04 NOTE — Addendum Note (Signed)
Addended by: Sarina Ill B on: 07/04/2016 01:31 PM   Modules accepted: Orders

## 2016-07-05 ENCOUNTER — Telehealth: Payer: Self-pay | Admitting: *Deleted

## 2016-07-05 LAB — CREATININE, SERUM
Creatinine, Ser: 0.76 mg/dL (ref 0.57–1.00)
GFR calc Af Amer: 112 mL/min/{1.73_m2} (ref >59)
GFR calc non Af Amer: 97 mL/min/{1.73_m2} (ref >59)

## 2016-07-05 LAB — BUN: BUN: 9 mg/dL (ref 6–24)

## 2016-07-05 NOTE — Telephone Encounter (Signed)
Spoke with patient and informed her that her labs are normal. She verbalized understanding, appreciation, had no questions.

## 2016-07-07 ENCOUNTER — Ambulatory Visit (HOSPITAL_COMMUNITY)
Admission: RE | Admit: 2016-07-07 | Discharge: 2016-07-07 | Disposition: A | Discharge status: Home / Self Care | Payer: Self-pay | Source: Ambulatory Visit | Attending: Neurology | Admitting: Neurology

## 2016-07-07 DIAGNOSIS — R569 Unspecified convulsions: Secondary | ICD-10-CM | POA: Insufficient documentation

## 2016-07-07 DIAGNOSIS — R251 Tremor, unspecified: Secondary | ICD-10-CM | POA: Insufficient documentation

## 2016-07-07 MED ORDER — GADOBENATE DIMEGLUMINE 529 MG/ML IV SOLN
15.0000 mL | Freq: Once | INTRAVENOUS | Status: AC | PRN
  Administered 2016-07-07: 15 mL via INTRAVENOUS

## 2016-07-10 ENCOUNTER — Telehealth: Payer: Self-pay | Admitting: *Deleted

## 2016-07-10 NOTE — Telephone Encounter (Signed)
-----   Message from Melvenia Beam, MD sent at 07/08/2016  6:25 PM EDT ----- MRI was normal thanks

## 2016-07-10 NOTE — Telephone Encounter (Signed)
LVM for pt about normal MRI brain per AA,MD note. Gave [facility] phone number if she has further questions or concerns.

## 2016-07-11 ENCOUNTER — Ambulatory Visit: Payer: Self-pay

## 2016-07-12 ENCOUNTER — Ambulatory Visit: Payer: Self-pay

## 2016-07-26 ENCOUNTER — Ambulatory Visit: Payer: Self-pay

## 2016-08-10 ENCOUNTER — Other Ambulatory Visit: Payer: Self-pay | Admitting: Internal Medicine

## 2016-08-15 ENCOUNTER — Ambulatory Visit: Payer: Self-pay

## 2016-08-28 ENCOUNTER — Telehealth: Payer: Self-pay

## 2016-08-28 NOTE — Telephone Encounter (Signed)
LVM returning her call about an appointment.

## 2016-08-29 ENCOUNTER — Telehealth: Payer: Self-pay | Admitting: Nurse Practitioner

## 2016-08-29 NOTE — Telephone Encounter (Signed)
Call back to schedule appointment

## 2016-10-02 ENCOUNTER — Telehealth: Payer: Self-pay

## 2016-10-02 NOTE — Telephone Encounter (Signed)
Received PAP application from [facility] for Pro Air placed for provider to sign.

## 2016-10-02 NOTE — Telephone Encounter (Signed)
Placed signed application/script in [facility] folder for pickup.

## 2016-10-03 ENCOUNTER — Telehealth: Payer: Self-pay | Admitting: Pharmacist

## 2016-10-03 NOTE — Telephone Encounter (Signed)
10/02/16 Faxed script to [place] for refill on ProAir HFA 0.09mg  Inhale 2 puffs into the lungs every four hours as needed for wheezing and shortness of breath.Vanessa Baker

## 2016-10-10 ENCOUNTER — Ambulatory Visit: Payer: Self-pay | Attending: Oncology

## 2016-10-18 ENCOUNTER — Ambulatory Visit: Payer: Self-pay

## 2016-10-25 ENCOUNTER — Ambulatory Visit: Payer: Self-pay | Admitting: Family Medicine

## 2016-10-25 VITALS — BP 131/79 | HR 77 | Temp 98.2°F | Wt 184.7 lb

## 2016-10-25 DIAGNOSIS — J45909 Unspecified asthma, uncomplicated: Secondary | ICD-10-CM

## 2016-10-25 DIAGNOSIS — I10 Essential (primary) hypertension: Secondary | ICD-10-CM

## 2016-10-25 MED ORDER — LISINOPRIL 10 MG PO TABS: 10.0000 mg | ORAL_TABLET | Freq: Every day | ORAL | 3 refills | Status: DC

## 2016-10-25 MED ORDER — ATORVASTATIN CALCIUM 20 MG PO TABS: 20.0000 mg | ORAL_TABLET | Freq: Every day | ORAL | 1 refills | Status: DC

## 2016-10-25 MED ORDER — ALBUTEROL SULFATE HFA 108 (90 BASE) MCG/ACT IN AERS: INHALATION_SPRAY | RESPIRATORY_TRACT | 5 refills | Status: DC

## 2016-10-25 NOTE — Progress Notes (Signed)
   Subjective:    Patient ID: Vanessa Baker, female    DOB: Jun 17, 1975, 41 y.o.   MRN: [id number]  HPI   Pt needs refill on medications   Review of Systems     Objective:   Physical Exam  BP 131/79   Pulse 77   Temp 98.2 F (36.8 C)   Wt 184 lb 11.2 oz (83.8 kg)   BMI 33.78 kg/m   Pleasant, cooperative, well-nourished well-developed white female in no acute distress Head and neck normal Thyroid enlarged Chest clear lungs clear      Assessment & Plan:  Asthma Patient advised to quit smoking  Refill Albuterol, Lipitor, and Lisinopril   Labs today: CBC, Met C, TSH, Lipid  Hypertension Hyperlipidemia Severe depression Followed by psychiatry  I have done the exam and reviewed the chart and it is accurate to the best of my knowledge. Development worker, community has been used and  any errors in dictation or transcription are unintentional. Miguel Aschoff M.D. [facility]  FU in 3 mo

## 2016-10-26 LAB — LIPID PANEL
Chol/HDL Ratio: 7.9 ratio — ABNORMAL HIGH (ref 0.0–4.4)
Cholesterol, Total: 262 mg/dL — ABNORMAL HIGH (ref 100–199)
HDL: 33 mg/dL — ABNORMAL LOW (ref >39)
Triglycerides: 687 mg/dL (ref 0–149)

## 2016-10-26 LAB — CBC
Hematocrit: 43.6 % (ref 34.0–46.6)
Hemoglobin: 14.6 g/dL (ref 11.1–15.9)
MCH: 30.7 pg (ref 26.6–33.0)
MCHC: 33.5 g/dL (ref 31.5–35.7)
MCV: 92 fL (ref 79–97)
Platelets: 315 10*3/uL (ref 150–379)
RBC: 4.76 x10E6/uL (ref 3.77–5.28)
RDW: 13.5 % (ref 12.3–15.4)
WBC: 12 10*3/uL — ABNORMAL HIGH (ref 3.4–10.8)

## 2016-10-26 LAB — COMPREHENSIVE METABOLIC PANEL
ALT: 17 IU/L (ref 0–32)
AST: 12 IU/L (ref 0–40)
Albumin/Globulin Ratio: 1.7 (ref 1.2–2.2)
Albumin: 4.5 g/dL (ref 3.5–5.5)
Alkaline Phosphatase: 62 IU/L (ref 39–117)
BUN/Creatinine Ratio: 19 (ref 9–23)
BUN: 12 mg/dL (ref 6–24)
Bilirubin Total: <0.2 mg/dL (ref 0.0–1.2)
CO2: 25 mmol/L (ref 20–29)
Calcium: 9.6 mg/dL (ref 8.7–10.2)
Chloride: 97 mmol/L (ref 96–106)
Creatinine, Ser: 0.62 mg/dL (ref 0.57–1.00)
GFR calc Af Amer: 129 mL/min/{1.73_m2} (ref >59)
GFR calc non Af Amer: 112 mL/min/{1.73_m2} (ref >59)
Globulin, Total: 2.6 g/dL (ref 1.5–4.5)
Glucose: 84 mg/dL (ref 65–99)
Potassium: 4.9 mmol/L (ref 3.5–5.2)
Sodium: 136 mmol/L (ref 134–144)
Total Protein: 7.1 g/dL (ref 6.0–8.5)

## 2016-10-26 LAB — TSH: TSH: 2.13 u[IU]/mL (ref 0.450–4.500)

## 2016-12-04 ENCOUNTER — Telehealth: Payer: Self-pay | Admitting: Adult Health Nurse Practitioner

## 2016-12-04 NOTE — Telephone Encounter (Signed)
Wants test results and refill sent to medication management

## 2016-12-06 ENCOUNTER — Telehealth: Payer: Self-pay

## 2016-12-06 NOTE — Telephone Encounter (Signed)
Pt called wanting test results and a med refill. Gave pt results and the refill was already done. PT verbalized understanding.

## 2016-12-24 ENCOUNTER — Telehealth: Payer: Self-pay | Admitting: Pharmacist

## 2016-12-24 NOTE — Telephone Encounter (Signed)
12/24/16 Called [facility] for refill on ProAir HFA 0.09mg  Inhale 2 puffs every 4 hours # 3, spoke with Barnabas Lister allow 5-7 business days to receive med. Patient enrolled till 07/06/17.Delos Haring

## 2017-01-01 ENCOUNTER — Telehealth: Payer: Self-pay | Admitting: Adult Health Nurse Practitioner

## 2017-01-02 ENCOUNTER — Ambulatory Visit: Payer: Self-pay | Admitting: Neurology

## 2017-01-02 ENCOUNTER — Encounter: Payer: Self-pay | Admitting: Neurology

## 2017-01-02 ENCOUNTER — Encounter (INDEPENDENT_AMBULATORY_CARE_PROVIDER_SITE_OTHER): Payer: Self-pay

## 2017-01-02 VITALS — BP 137/64 | HR 73 | Ht 62.0 in | Wt 185.0 lb

## 2017-01-02 DIAGNOSIS — R251 Tremor, unspecified: Secondary | ICD-10-CM

## 2017-01-02 MED ORDER — PRIMIDONE 50 MG PO TABS: 50.0000 mg | ORAL_TABLET | Freq: Every day | ORAL | 11 refills | Status: DC

## 2017-01-02 NOTE — Progress Notes (Signed)
[facility]    Provider:  Dr Jaynee Eagles  CC:  tremor  Still having tremors, have tried several medications, discussed primidone wil try that  HPI:  Vanessa Baker is a 41 y.o. female here as a referral from Dr. No ref. provider found for tremors specifically parkinson's disease. Past medical history migraine, depression, anxiety, cholesterol, bipolar disorder, bursitis, IBS. She is already been evaluated by neurology in the past and the tremors were diagnosed as secondary to psychotropic medications. Everything shakes. Started years ago. Here with her father who also provides information. Symptoms are worsening. Started 5-8 years ago. It started in her hands first. Now everything shakes including the head, legs, hands. Started in the left hand. Now even her legs shake and her feet shake. She can even feel her eyes shake. Continuous and never stops. If she gets upset it gets worse. Nothing makes it better. Mother has tremors unknown why. Maternal grandfather had parkinson's disease. Worse with using a spoon and drinking, worse with use. Feels her head shaking. No changes in voice. No difficulty walking but hurts to walk due to bursitis. She has fallen after stepping on her leg and her leg gave out and she fell. She also reports restless legs at night 2-3x a week. Tremors are getting worse. She has "staring spells" without loss of consciousness or loss of awareness.   Reviewed notes, labs and imaging from outside physicians, which showed:   LDL 99, cholesterol 173, triglycerides 207, A1c 5.4, BUN 11, creatinine 0.8 11/10/2015. TSH 2.550 12/09/2014  CT head 06/2013: Personally reviewed images and agree with the following:  FINDINGS:  There is no evidence of mass effect, midline shift or extra-axial  fluid collections. There is no evidence of a space-occupying lesion  or intracranial hemorrhage. There is no evidence of a cortical-based  area of acute infarction.   The  ventricles and sulci are appropriate for the patient's age. The  basal cisterns are patent.   Visualized portions of the orbits are unremarkable. The visualized  portions of the paranasal sinuses and mastoid air cells are  unremarkable.   The osseous structures are unremarkable.   IMPRESSION:  Normal CT of the brain without intravenous contrast.    Electronically Signed  By: Kathreen Devoid  On: 06/10/2013 18:51   Reviewed notes from [facility]. Patient actually has been seen by neurology for tremors. She was seen in 2015 in neurology. She reported tremors in both hands the left hand is worse. Happens with rest and activity. Her hands will jerk while she is typing. Tremor is constant. Severe interferes with eating, drinking and holding objects. She drops objects. Being upset or stress makes the tremor worse. She has a hard time putting makeup on and putting sugar under coffee without making a mess. Concern for tremor was due to antipsychotic medication exposure. Essential tremor is also on the differential diagnosis in the setting of chronic antipsychotic use they consider changing her to a non-dopamine blocking medication or at least Seroquel should be considered. They recommended she speak with her psychiatrist. They started clonazepam twice daily for symptomatic controls. She was on Topamax in the past twice a day for headaches.  Reviewed notes in Epic. In May 2015 patient reported to the emergency room with slurred speech, tremors, and reported that she could not hold her neck up. Symptoms started for 2 days. She complained of confusion and pain in her back of the head and able to keep her head up. She also complained of  dizziness and blurred vision. She admits been seen in the emergency room the night before. She was taking a lot of psychotropic medications. She reported her left side of her neck was tilted for the past 2 days. She took Abilify which had been started recently. She has a long  history of bipolar disorder and follows with psychiatry at [facility]. The time she had been taking a combination of sertraline, Depakote, Abilify, propranolol and Topamax for a long period of time. No history of substance abuse. It was felt that her symptoms were due to medications and she was given Benadryl. Her medications were adjusted. CT of the head was unremarkable.  Review of Systems: Patient complains of symptoms per HPI as well as the following symptoms: Weight gain, chest pain, trouble swallowing, shortness of breath, cough, wheezing, constipation, urination problems, cramps come aching muscles, feeling hot, increased thirst, memory loss, confusion, headache, numbness, weakness, difficulty swallowing, insomnia, sleepiness, snoring, restless legs, difficulty swallowing, tremor, depression, anxiety, too much sleep, not enough sleep, decreased energy, change in appetite, disinterest in activities, hallucinations, racing thoughts. Pertinent negatives per HPI. All others negative.     Social History   Socioeconomic History  . Marital status: Married    Spouse name: Not on file  . Number of children: 1  . Years of education: 24  . Highest education level: Not on file  Social Needs  . Financial resource strain: Not on file  . Food insecurity - worry: Not on file  . Food insecurity - inability: Not on file  . Transportation needs - medical: Not on file  . Transportation needs - non-medical: Not on file  Occupational History  . Occupation: disability  Tobacco Use  . Smoking status: Current Every Day Smoker    Packs/day: 0.75    Years: 27.00    Pack years: 20.25    Types: Cigarettes  . Smokeless tobacco: Never Used  Substance and Sexual Activity  . Alcohol use: Yes    Alcohol/week: 4.8 - 5.4 oz    Types: 3 Glasses of wine, 5 - 6 Cans of beer per week    Comment: monthly  . Drug use: No  . Sexual activity: Not on file  Other Topics Concern  . Not on file  Social History Narrative     Lives with husband, [name] (on [place])   Right-handed   Caffeine use: 1 cup coffee a day, 2 soft drinks per day    Family History  Problem Relation Age of Onset  . Bipolar disorder Mother   . Schizophrenia Mother   . Hypertension Mother   . Diabetes Mother   . Cancer Mother   . Bipolar disorder Sister   . Bipolar disorder Brother   . Hypertension Father   . Cancer Father   . Breast cancer Maternal Aunt   . Breast cancer Paternal Aunt   . Breast cancer Maternal Grandmother   . Breast cancer Paternal Grandmother   . Parkinson's disease Maternal Grandfather   . Tremor Neg Hx     Past Medical History:  Diagnosis Date  . Anxiety   . Bipolar disorder ([place])   . Bursitis   . Depression   . GERD (gastroesophageal reflux disease)   . Headache   . High cholesterol   . Hypertension   . Seizures ([place])     Past Surgical History:  Procedure Laterality Date  . NO PAST SURGERIES      Current Outpatient Medications  Medication Sig Dispense Refill  .  albuterol (PROAIR HFA) 108 (90 Base) MCG/ACT inhaler Inhale 2 puffs into the lungs every 4 (four) hours as needed for wheezing or shortness of breath. 8.5 g 5  . atorvastatin (LIPITOR) 20 MG tablet Take 1 tablet (20 mg total) by mouth daily. 90 tablet 1  . Calcium Citrate-Vitamin D (CALCIUM + D PO) Take by mouth.    . divalproex (DEPAKOTE) 500 MG DR tablet Take 500 mg by mouth 3 (three) times daily.    Marland Kitchen lisinopril (PRINIVIL,ZESTRIL) 10 MG tablet Take 1 tablet (10 mg total) by mouth daily. 30 tablet 3  . lurasidone (LATUDA) 40 MG TABS tablet Take 60 mg by mouth 2 (two) times daily.     . Multiple Vitamin (MULTIVITAMIN) capsule Take 1 capsule by mouth daily.    . Omega-3 Fatty Acids (FISH OIL) 1000 MG CAPS Take by mouth.    . sertraline (ZOLOFT) 100 MG tablet Take 200 mg by mouth daily.    . trihexyphenidyl (ARTANE) 2 MG tablet Take 1 tablet (2 mg total) by mouth 3 (three) times daily with meals. 90 tablet 6   No current  facility-administered medications for this visit.     Allergies as of 01/02/2017 - Review Complete 01/02/2017  Allergen Reaction Noted  . Amitriptyline  04/01/2015    Vitals: BP 137/64 (BP Location: Left Arm, Patient Position: Sitting)   Pulse 73   Ht 5\' 2"  (1.575 m)   Wt 185 lb (83.9 kg)   BMI 33.84 kg/m  Last Weight:  Wt Readings from Last 1 Encounters:  01/02/17 185 lb (83.9 kg)   Last Height:   Ht Readings from Last 1 Encounters:  01/02/17 5\' 2"  (1.575 m)       Physical exam: Exam: Gen: NAD, conversant, well nourised, obese, well groomed                     CV: RRR, no MRG. No Carotid Bruits. No peripheral edema, warm, nontender Eyes: Conjunctivae clear without exudates or hemorrhage  Neuro: Detailed Neurologic Exam  Speech:    Speech is normal; fluent and spontaneous with normal comprehension.  Cognition:    The patient is oriented to person, place, and time;     recent and remote memory intact;     language fluent;     normal attention, concentration,     fund of knowledge Cranial Nerves:    The pupils are equal, round, and reactive to light. The fundi are normal and spontaneous venous pulsations are present. Visual fields are full to finger confrontation. Extraocular movements are intact. Trigeminal sensation is intact and the muscles of mastication are normal. The face is symmetric. The palate elevates in the midline. Hearing intact. Voice is normal. Shoulder shrug is normal. The tongue has normal motion without fasciculations.   Coordination:    Normal finger to nose and heel to shin. Normal rapid alternating movements.   Gait:    Heel-toe and tandem gait are normal.   Motor Observation:    No asymmetry, no atrophy, and no involuntary movements noted. Tone:    Normal muscle tone.    Posture:    Posture is normal. normal erect    Strength:    Strength is V/V in the upper and lower limbs.      Sensation: intact to LT     Reflex  Exam:  DTR's:    Deep tendon reflexes in the upper and lower extremities are normal bilaterally.   Toes:    The  toes are downgoing bilaterally.   Clonus:    Clonus is absent.       Assessment/Plan:  41 year old with tremors likely secondary to long history of psychotropic dopamine-blocking or other drugs such as Depakote. Essential tremor also a possibility so will trial propranolol. MRI of the brain to evaluate for tremor as well as staring spells patient reports which may be convulsions/ seizures but unlikely. If continues can also perform an eeg. Will check iron studies for RLS. Will check labs for the tremor, tsh was normal.  MRI brain was normal Propranolol did not help and caused side effects Tried Artane not helping 2mg  tid.  Try primidone: start at 50mg  at bedtime and email me in 4-6 weeks and can further increase.    Sarina Ill, MD  [facility] [address] [place], [place] [address]  Phone [phone] Fax [phone]  A total of 10  minutes was spent face-to-face with this patient. Over half this time was spent on counseling patient on the tremor diagnosis and different diagnostic and therapeutic options available.

## 2017-01-02 NOTE — Patient Instructions (Signed)
Primidone tablets What is this medicine? PRIMIDONE (PRI mi done) is a barbiturate. This medicine is used to control seizures in certain types of epilepsy. It is not for use in absence (petit mal) seizures. This medicine may be used for other purposes; ask your health care provider or pharmacist if you have questions. COMMON BRAND NAME(S): Mysoline What should I tell my health care provider before I take this medicine? They need to know if you have any of these conditions: -kidney disease -liver disease -porphyria -suicidal thoughts, plans, or attempt; a previous suicide attempt by you or a family member -an unusual or allergic reaction to primidone, phenobarbital, other barbiturates or seizure medications, other medicines, foods, dyes, or preservatives -pregnant or trying to get pregnant -breast-feeding How should I use this medicine? Take this medicine by mouth with a glass of water. Follow the directions on the prescription label. Take your doses at regular intervals. Do not take your medicine more often than directed. Do not stop taking except on the advice of your doctor or health care professional. A special MedGuide will be given to you by the pharmacist with each prescription and refill. Be sure to read this information carefully each time. Contact your pediatrician or health care professional regarding the use of this medication in children. Special care may be needed. While this drug may be prescribed for children for selected conditions, precautions do apply. Overdosage: If you think you have taken too much of this medicine contact a poison control center or emergency room at once. NOTE: This medicine is only for you. Do not share this medicine with others. What if I miss a dose? If you miss a dose, take it as soon as you can. If it is almost time for your next dose, take only that dose. Do not take double or extra doses. What may interact with this medicine? Do not take this  medicine with any of the following medications: -voriconazole This medicine may also interact with the following medications: -cancer-treating medications -cyclosporine -disopyramide -doxycycline -female hormones, including contraceptive or birth control pills -medicines for mental depression, anxiety or other mood problems -medicines for treating HIV infection or AIDS -modafinil -prescription pain medications -quinidine -warfarin This list may not describe all possible interactions. Give your health care provider a list of all the medicines, herbs, non-prescription drugs, or dietary supplements you use. Also tell them if you smoke, drink alcohol, or use illegal drugs. Some items may interact with your medicine. What should I watch for while using this medicine? Visit your doctor or health care professional for regular checks on your progress. It may be 2 to 3 weeks before you see the full effects of this medicine. Do not suddenly stop taking this medicine, you may increase the risk of seizures. Your doctor or health care professional may want to gradually reduce the dose. Wear a medical identification bracelet or chain to say you have epilepsy, and carry a card that lists all your medications. You may get drowsy or dizzy. Do not drive, use machinery, or do anything that needs mental alertness until you know how this medicine affects you. Do not stand or sit up quickly, especially if you are an older patient. This reduces the risk of dizzy or fainting spells. Alcohol may interfere with the effect of this medicine. Avoid alcoholic drinks. Birth control pills may not work properly while you are taking this medicine. Talk to your doctor about using an extra method of birth control. The use of this medicine  may increase the chance of suicidal thoughts or actions. Pay special attention to how you are responding while on this medicine. Any worsening of mood, or thoughts of suicide or dying should be  reported to your health care professional right away. Women who become pregnant while using this medicine may enroll in the [place] Pregnancy Registry by calling [phone]. This registry collects information about the safety of antiepileptic drug use during pregnancy. What side effects may I notice from receiving this medicine? Side effects that you should report to your doctor or health care professional as soon as possible: -allergic reactions like skin rash, itching or hives, swelling of the face, lips, or tongue -blurred, double vision, or uncontrollable rolling or movements of the eyes -redness, blistering, peeling or loosening of the skin, including inside the mouth -shortness of breath or difficulty breathing -unusual excitement or restlessness, more likely in children and the elderly -unusually weak or tired -worsening of mood, thoughts or actions of suicide or dying Side effects that usually do not require medical attention (report to your doctor or health care professional if they continue or are bothersome): -clumsiness, unsteadiness, or a hang-over effect -decreased sexual ability -dizziness, drowsiness -loss of appetite -nausea or vomiting This list may not describe all possible side effects. Call your doctor for medical advice about side effects. You may report side effects to FDA at 1-800-FDA-1088. Where should I keep my medicine? Keep out of the reach of children. This medicine may cause accidental overdose and death if it taken by other adults, children, or pets. Mix any unused medicine with a substance like cat litter or coffee grounds. Then throw the medicine away in a sealed container like a sealed bag or a coffee can with a lid. Do not use the medicine after the expiration date. Store at room temperature between 15 and 30 degrees C (59 and 86 degrees F). NOTE: This sheet is a summary. It may not cover all possible information. If you have  questions about this medicine, talk to your doctor, pharmacist, or health care provider.  2018 Elsevier/Gold Standard (2013-03-20 15:40:08)

## 2017-01-24 ENCOUNTER — Ambulatory Visit: Payer: Self-pay | Admitting: Nurse Practitioner

## 2017-01-25 ENCOUNTER — Telehealth: Payer: Self-pay | Admitting: Pharmacy Technician

## 2017-01-25 NOTE — Telephone Encounter (Signed)
Patient has Medicare Part D.  [facility] unable to provide medication assistance.  [facility]

## 2017-02-12 ENCOUNTER — Ambulatory Visit: Payer: Self-pay | Admitting: Nurse Practitioner

## 2017-02-28 ENCOUNTER — Encounter: Payer: Self-pay | Admitting: Nurse Practitioner

## 2017-02-28 ENCOUNTER — Other Ambulatory Visit: Payer: Self-pay

## 2017-02-28 ENCOUNTER — Ambulatory Visit (INDEPENDENT_AMBULATORY_CARE_PROVIDER_SITE_OTHER): Payer: Medicare Other | Admitting: Nurse Practitioner

## 2017-02-28 VITALS — BP 125/70 | HR 84 | Temp 98.5°F | Ht 62.0 in | Wt 182.6 lb

## 2017-02-28 DIAGNOSIS — N939 Abnormal uterine and vaginal bleeding, unspecified: Secondary | ICD-10-CM | POA: Diagnosis not present

## 2017-02-28 DIAGNOSIS — R102 Pelvic and perineal pain: Secondary | ICD-10-CM

## 2017-02-28 DIAGNOSIS — B3731 Acute candidiasis of vulva and vagina: Secondary | ICD-10-CM

## 2017-02-28 DIAGNOSIS — J4540 Moderate persistent asthma, uncomplicated: Secondary | ICD-10-CM

## 2017-02-28 DIAGNOSIS — I10 Essential (primary) hypertension: Secondary | ICD-10-CM | POA: Diagnosis not present

## 2017-02-28 DIAGNOSIS — Z7689 Persons encountering health services in other specified circumstances: Secondary | ICD-10-CM | POA: Diagnosis not present

## 2017-02-28 DIAGNOSIS — F319 Bipolar disorder, unspecified: Secondary | ICD-10-CM | POA: Diagnosis not present

## 2017-02-28 DIAGNOSIS — B373 Candidiasis of vulva and vagina: Secondary | ICD-10-CM

## 2017-02-28 LAB — POCT URINE PREGNANCY: Preg Test, Ur: NEGATIVE

## 2017-02-28 MED ORDER — BUDESONIDE-FORMOTEROL FUMARATE 80-4.5 MCG/ACT IN AERO: 2.0000 | INHALATION_SPRAY | Freq: Two times a day (BID) | RESPIRATORY_TRACT | 3 refills | Status: DC

## 2017-02-28 MED ORDER — FLUCONAZOLE 150 MG PO TABS: 150.0000 mg | ORAL_TABLET | Freq: Once | ORAL | 0 refills | Status: DC

## 2017-02-28 MED ORDER — DIVALPROEX SODIUM 500 MG PO DR TAB: 500.0000 mg | DELAYED_RELEASE_TABLET | Freq: Three times a day (TID) | ORAL | 0 refills | Status: DC

## 2017-02-28 NOTE — Progress Notes (Signed)
Subjective:    Patient ID: Vanessa Baker, female    DOB: 1975-06-05, 42 y.o.   MRN: [id number]  Vanessa Baker is a 42 y.o. female presenting on 02/28/2017 for Establish Care (lightheadeded, dizzinessx 2weeks pt  also reports hot sensation. x 63yr) and Vaginal Bleeding (after intercourse. Intermittent abdominal pain on the left side x 3-4 weeks. Rash on the right x 1 week)   HPI Establish Care New Provider [facility] - last visit between 6-12 months ago.  Last PCP was over 3 years ago.  [facility]. - Recent addition of insurance has changed where she will get medical care.   - Was going to [facility] Dr. Jamse Arn and Randall Hiss.  Now will not see her. - [facility] for tremors - IBS- no provider for this in several years - Dr. Ahmed Prima last at [facility].    Bipolar I Disorder States that [facility] will not fill her depakote.  Pt states she is out of the Depakote and has been for 4 days. Pt has started having more feelings of being down in last 4 days.  Is faster to go off in anger.  She has missed appointment, but will be resuming psychiatric care after referral for medicare.  Asthma Pt has history of asthma.  Pt is using albuterol inhaler 5-6 times per day every day.  Has more difficulty with shortness of breath with exertion, but is not able to do more than 10-15 minutes of activity before having symptoms.  Irregular Vaginal Bleeding No regular periods in "a while." Periods have had longer/shorter time between menses. For her last 4 sexual intercourse encounters has had intermittent bleeding.   - Had bleeding with 1st sexual encounter fairly significantly. Started immediately after intercourse (heavy bleeding with use of 3 pads)    - No bleeding with 2nd encounter - small amount 3rd encounter when pt bled lightly x 3 days. - No bleeding with 4th encounter - All irregular bleeding occurred after episodes of abdominal pain.   - Pt  endorses R lower abdomen/pelvis pain that began 3 weeks ago.  She noted bloating and generalized pain with occasional shooting pains in lower pelvis.  With regards to bloating, pt states that the "R side is a bit more hard."  She has had a history of constipation, but has had no constipation in last 4 weeks.   - Mutually monogamous relationship.   - No contraception currently.  - LMP: January 15th-20th.    Past Medical History:  Diagnosis Date  . Allergy   . Anxiety   . Asthma   . Bipolar disorder ([place])   . Bursitis   . Depression   . Frequent headaches   . GERD (gastroesophageal reflux disease)   . Headache   . High cholesterol   . Hypertension   . Irritable bowel syndrome (IBS)   . Seizures ([place])   . Tremor    Past Surgical History:  Procedure Laterality Date  . NO PAST SURGERIES     Social History   Socioeconomic History  . Marital status: Married    Spouse name: Not on file  . Number of children: 1  . Years of education: 50  . Highest education level: Not on file  Social Needs  . Financial resource strain: Not on file  . Food insecurity - worry: Not on file  . Food insecurity - inability: Not on file  . Transportation needs -  medical: Not on file  . Transportation needs - non-medical: Not on file  Occupational History  . Occupation: disability  Tobacco Use  . Smoking status: Current Every Day Smoker    Packs/day: 0.50    Years: 27.00    Pack years: 13.50    Types: Cigarettes  . Smokeless tobacco: Never Used  Substance and Sexual Activity  . Alcohol use: Yes    Alcohol/week: 4.8 - 5.4 oz    Types: 3 Glasses of wine, 5 - 6 Cans of beer per week    Comment: monthly  . Drug use: No  . Sexual activity: Yes    Partners: Male    Birth control/protection: None  Other Topics Concern  . Not on file  Social History Narrative   Lives with husband, [name] (on [place])   Right-handed   Caffeine use: 1 cup coffee a day, 2 soft drinks per day   Family  History  Problem Relation Age of Onset  . Bipolar disorder Mother   . Schizophrenia Mother   . Hypertension Mother   . Diabetes Mother   . Cancer Mother   . Bipolar disorder Sister   . Bipolar disorder Brother   . Hypertension Father   . Cancer Father   . Breast cancer Maternal Aunt   . Breast cancer Paternal Aunt   . Breast cancer Maternal Grandmother   . Breast cancer Paternal Grandmother   . Parkinson's disease Maternal Grandfather   . Tremor Neg Hx    Current Outpatient Medications on File Prior to Visit  Medication Sig  . albuterol (PROAIR HFA) 108 (90 Base) MCG/ACT inhaler Inhale 2 puffs into the lungs every 4 (four) hours as needed for wheezing or shortness of breath.  Marland Kitchen atorvastatin (LIPITOR) 20 MG tablet Take 1 tablet (20 mg total) by mouth daily.  . Calcium Citrate-Vitamin D (CALCIUM + D PO) Take by mouth.  Marland Kitchen lisinopril (PRINIVIL,ZESTRIL) 10 MG tablet Take 1 tablet (10 mg total) by mouth daily.  . Omega-3 Fatty Acids (FISH OIL) 1000 MG CAPS Take by mouth.  . primidone (MYSOLINE) 50 MG tablet Take 1 tablet (50 mg total) by mouth at bedtime.  . trihexyphenidyl (ARTANE) 2 MG tablet Take 1 tablet (2 mg total) by mouth 3 (three) times daily with meals.  . Multiple Vitamin (MULTIVITAMIN) capsule Take 1 capsule by mouth daily.  . sertraline (ZOLOFT) 100 MG tablet Take 200 mg by mouth daily.   No current facility-administered medications on file prior to visit.     Review of Systems  Constitutional: Negative.   HENT: Negative.   Eyes: Negative.   Respiratory: Negative.   Cardiovascular: Negative.   Gastrointestinal: Positive for abdominal distention and abdominal pain. Negative for blood in stool, constipation, diarrhea and nausea.  Endocrine: Negative.   Genitourinary: Positive for menstrual problem, pelvic pain and vaginal bleeding. Negative for difficulty urinating, dyspareunia and dysuria.  Musculoskeletal: Negative.   Skin: Negative.   Allergic/Immunologic:  Negative.   Neurological: Negative.   Hematological: Negative.   Psychiatric/Behavioral: Positive for dysphoric mood and sleep disturbance. Negative for self-injury and suicidal ideas. The patient is nervous/anxious.    Per HPI unless specifically indicated above      Objective:    BP 125/70 (BP Location: Right Arm, Patient Position: Sitting, Cuff Size: Normal)   Pulse 84   Temp 98.5 F (36.9 C) (Oral)   Ht 5\' 2"  (1.575 m)   Wt 182 lb 9.6 oz (82.8 kg)   BMI 33.40 kg/m  Wt Readings from Last 3 Encounters:  03/28/17 183 lb (83 kg)  02/28/17 182 lb 9.6 oz (82.8 kg)  01/02/17 185 lb (83.9 kg)    Physical Exam  General - overweight, well-appearing, NAD HEENT - Normocephalic, atraumatic, PERRL, EOMI, patent nares w/o congestion, oropharynx clear, MMM Heart - RRR, no murmurs heard Lungs - Wheezing and rhonchi throughout all lobes, no crackles. Normal work of breathing. Abdomen - soft, NTND, no masses, no hepatosplenomegaly, active bowel sounds GU - Normal external female genitalia without lesions or fusion. Vaginal canal without lesions. Normal appearing cervix without lesions or friability. Thick white discharge on exam. Bimanual exam without adnexal masses, enlarged uterus, or cervical motion tenderness. Extremeties - non-tender, no edema, cap refill < 2 seconds, peripheral pulses intact +2 bilaterally Skin - warm, dry, no rashes Neuro - awake, alert, oriented x3, normal gait Psych - Anxious mood and affect, somewhat withdrawn and restless  Results for orders placed or performed in visit on 02/28/17  Comprehensive metabolic panel  Result Value Ref Range   Glucose, Bld 88 65 - 99 mg/dL   BUN 12 7 - 25 mg/dL   Creat 0.58 0.50 - 1.10 mg/dL   BUN/Creatinine Ratio NOT APPLICABLE 6 - 22 (calc)   Sodium 136 135 - 146 mmol/L   Potassium 4.1 3.5 - 5.3 mmol/L   Chloride 101 98 - 110 mmol/L   CO2 29 20 - 32 mmol/L   Calcium 9.1 8.6 - 10.2 mg/dL   Total Protein 5.9 (L) 6.1 - 8.1 g/dL     Albumin 3.9 3.6 - 5.1 g/dL   Globulin 2.0 1.9 - 3.7 g/dL (calc)   AG Ratio 2.0 1.0 - 2.5 (calc)   Total Bilirubin 0.2 0.2 - 1.2 mg/dL   Alkaline phosphatase (APISO) 58 33 - 115 U/L   AST 9 (L) 10 - 30 U/L   ALT 12 6 - 29 U/L  Lipid panel  Result Value Ref Range   Cholesterol 150 <200 mg/dL   HDL 26 (L) >50 mg/dL   Triglycerides 385 (H) <150 mg/dL   LDL Cholesterol (Calc) 78 mg/dL (calc)   Total CHOL/HDL Ratio 5.8 (H) <5.0 (calc)   Non-HDL Cholesterol (Calc) 124 <130 mg/dL (calc)  POCT urine pregnancy  Result Value Ref Range   Preg Test, Ur Negative Negative      Assessment & Plan:   Problem List Items Addressed This Visit      Cardiovascular and Mediastinum   Essential hypertension Currently controlled hypertension.  BP at goal.  Pt is not currently working on lifestyle modifications.  Taking medications tolerating well without side effects. No currently identified complications, but no recent kidney function check.  Plan: 1. Continue taking lisinopril. 2. Obtain labs CMP, lipid  3. Encouraged heart healthy diet and increasing exercise to 30 minutes most days of the week. 4. Check BP 1-2 x per week at home, keep log, and bring to clinic at next appointment. 5. Follow up 3 months.     Relevant Orders   Comprehensive metabolic panel (Completed)   Lipid panel (Completed)     Respiratory   Moderate persistent asthma without complication Asthma not currently well controlled on albuterol only.  Pt using daily 5-6 doses per day.  No current or recent past maintenance dose.  Pt continues to smoke 1/2 ppd.  Concern for early COPD symptoms as well.  Plan: 1. START Symbicort 2 puffs twice daily. 2. Continue albuterol 1-2 puffs up to 4 times daily as needed.  Should  not need daily.  If needing daily, will need to increase/change maintenance medications. 3. Followup at next visit in 3 months.   Relevant Medications   budesonide-formoterol (SYMBICORT) 80-4.5 MCG/ACT inhaler      Other   Bipolar I disorder ([place]) Pt with known bipolar I disorder on depakote.  No medication x 4 days [facility] with new insurance.  Plan: 1. Depakote level today 2. Referral psychiatry 3. Refill of depakote provided.  Continue current dose. 4. Followup as needed, but need psychiatry for continued monitoring/management.   Relevant Medications   divalproex (DEPAKOTE) 500 MG DR tablet   Other Relevant Orders   Ambulatory referral to Psychiatry   Valproic Acid level    Other Visit Diagnoses    Encounter to establish care [facility].  Records will be requested.  Past medical, family, and surgical history reviewed w/ pt in clinic today.  Vaginal candidiasis     Pt with increased thick white vaginal secretions.  Confirmed yeast infection on wet prep.   Plan: 1. Take diflucan 150 mg one tablet once daily. 2. Followup as needed.    Pelvic pain in female       Vaginal bleeding, abnormal     - Primary Pelvic pain and irregular bleeding most likely related to sexual intercourse trauma.  No identified trauma on exam today.  Pelvic pain could also be secondary to diverticulitis or constipation.  Plan: 1. Pt in mutually monogamous relationship declined STI testing today.  Recommended for person with pelvic pain and vaginal bleeding.  Pt verbalized understanding. 2. POCT urine pregnancy negative.   Relevant Orders   POCT urine pregnancy (Completed)       Meds ordered this encounter  Medications  . divalproex (DEPAKOTE) 500 MG DR tablet    Sig: Take 1 tablet (500 mg total) by mouth 3 (three) times daily.    Dispense:  90 tablet    Refill:  0    Order Specific Question:   Supervising Provider    Answer:   Olin Hauser [[id number]]  . budesonide-formoterol (SYMBICORT) 80-4.5 MCG/ACT inhaler    Sig: Inhale 2 puffs into the lungs 2 (two) times daily.    Dispense:  1 Inhaler    Refill:  3    Order Specific  Question:   Supervising Provider    Answer:   Olin Hauser [[id number]]  . fluconazole (DIFLUCAN) 150 MG tablet    Sig: Take 1 tablet (150 mg total) by mouth once for 1 dose.    Dispense:  1 tablet    Refill:  0    Order Specific Question:   Supervising Provider    Answer:   Olin Hauser [[id number]]   Follow up plan: Return in about 4 weeks (around 03/28/2017) for hypertension, hyperlipidemia.  Cassell Smiles, DNP, AGPCNP-BC Adult Gerontology Primary Care Nurse Practitioner [facility] 03/28/2017, 5:05 PM

## 2017-02-28 NOTE — Patient Instructions (Addendum)
Neomi, Thank you for coming in to clinic today.  1. Your vaginal bleeding is most likely related to sexual trauma that is now healed.  I am checking a urine pregnancy test to make sure this is not the cause of your intermittent bleeding.  2. You will be due for FASTING BLOOD WORK.  This means you should eat no food or drink after midnight.  Drink only water or coffee without cream/sugar on the morning of your lab visit. - Please go ahead and schedule a "Lab Only" visit in the morning at the clinic for lab draw about 7 days after resuming your Depakote.  M-F 8a-11:45a - Your results will be available about 2-3 days after blood draw.  If you have set up a MyChart account, you can can log in to MyChart online to view your results and a brief explanation. Also, we can discuss your results together at your next office visit if you would like.  3. Your asthma is not well controlled.  Please start Symbicort 2 puffs twice daily. - Continue albuterol 1-2 puffs every 4 hours as needed for wheezing and shortness of breath.  4. Continue working to transition your psychiatric care.  I have sent a referral to [facility] psychiatric [facility] as well.  5. You also have a yeast infection.  Take diflucan 150 mg tablet once.  Please schedule a follow-up appointment with Cassell Smiles, AGNP. Return in about 4 weeks (around 03/28/2017) for hypertension, hyperlipidemia. (Cholesterol)  If you have any other questions or concerns, please feel free to call the clinic or send a message through [place]. You may also schedule an earlier appointment if necessary.  You will receive a survey after today's visit either digitally by e-mail or paper by [employer]. Your experiences and feedback matter to [place].  Please respond so we know how we are doing as we provide care for you.   Cassell Smiles, DNP, AGNP-BC Adult Gerontology Nurse Practitioner [place]

## 2017-03-07 ENCOUNTER — Other Ambulatory Visit: Payer: Medicare Other

## 2017-03-07 LAB — COMPREHENSIVE METABOLIC PANEL
AG Ratio: 2 (calc) (ref 1.0–2.5)
ALT: 12 U/L (ref 6–29)
AST: 9 U/L — ABNORMAL LOW (ref 10–30)
Albumin: 3.9 g/dL (ref 3.6–5.1)
Alkaline phosphatase (APISO): 58 U/L (ref 33–115)
BUN: 12 mg/dL (ref 7–25)
CO2: 29 mmol/L (ref 20–32)
Calcium: 9.1 mg/dL (ref 8.6–10.2)
Chloride: 101 mmol/L (ref 98–110)
Creat: 0.58 mg/dL (ref 0.50–1.10)
Globulin: 2 g/dL (calc) (ref 1.9–3.7)
Glucose, Bld: 88 mg/dL (ref 65–99)
Potassium: 4.1 mmol/L (ref 3.5–5.3)
Sodium: 136 mmol/L (ref 135–146)
Total Bilirubin: 0.2 mg/dL (ref 0.2–1.2)
Total Protein: 5.9 g/dL — ABNORMAL LOW (ref 6.1–8.1)

## 2017-03-07 LAB — LIPID PANEL
Cholesterol: 150 mg/dL (ref <200)
HDL: 26 mg/dL — ABNORMAL LOW (ref >50)
LDL Cholesterol (Calc): 78 mg/dL (calc)
Non-HDL Cholesterol (Calc): 124 mg/dL (calc) (ref <130)
Total CHOL/HDL Ratio: 5.8 (calc) — ABNORMAL HIGH (ref <5.0)
Triglycerides: 385 mg/dL — ABNORMAL HIGH (ref <150)

## 2017-03-28 ENCOUNTER — Encounter: Payer: Self-pay | Admitting: Nurse Practitioner

## 2017-03-28 ENCOUNTER — Ambulatory Visit (INDEPENDENT_AMBULATORY_CARE_PROVIDER_SITE_OTHER): Payer: Medicare Other | Admitting: Nurse Practitioner

## 2017-03-28 VITALS — BP 131/72 | HR 86 | Temp 99.1°F | Ht 62.0 in | Wt 183.0 lb

## 2017-03-28 DIAGNOSIS — I10 Essential (primary) hypertension: Secondary | ICD-10-CM

## 2017-03-28 DIAGNOSIS — I152 Hypertension secondary to endocrine disorders: Secondary | ICD-10-CM | POA: Insufficient documentation

## 2017-03-28 DIAGNOSIS — J4540 Moderate persistent asthma, uncomplicated: Secondary | ICD-10-CM | POA: Insufficient documentation

## 2017-03-28 DIAGNOSIS — E441 Mild protein-calorie malnutrition: Secondary | ICD-10-CM | POA: Diagnosis not present

## 2017-03-28 DIAGNOSIS — Z1231 Encounter for screening mammogram for malignant neoplasm of breast: Secondary | ICD-10-CM | POA: Diagnosis not present

## 2017-03-28 DIAGNOSIS — E781 Pure hyperglyceridemia: Secondary | ICD-10-CM | POA: Diagnosis not present

## 2017-03-28 DIAGNOSIS — E1159 Type 2 diabetes mellitus with other circulatory complications: Secondary | ICD-10-CM | POA: Insufficient documentation

## 2017-03-28 HISTORY — DX: Essential (primary) hypertension: I10

## 2017-03-28 MED ORDER — OMEGA-3-ACID ETHYL ESTERS 1 G PO CAPS: 1.0000 g | ORAL_CAPSULE | Freq: Two times a day (BID) | ORAL | 5 refills | Status: DC

## 2017-03-28 MED ORDER — UNJURY CHOCOLATE CLASSIC POWDER: 2.0000 [oz_av] | Freq: Every day | ORAL | 8 refills | Status: DC

## 2017-03-28 NOTE — Patient Instructions (Addendum)
Vanessa Baker, Thank you for coming in to clinic today.  1. You will be due for FASTING BLOOD WORK.  This means you should eat no food or drink after midnight.  Drink only water or coffee without cream/sugar on the morning of your lab visit. - Please go ahead and schedule a "Lab Only" visit in the morning at the clinic for lab draw 3 days before your next office visit. - Your results will be available about 2-3 days after blood draw.  If you have set up a MyChart account, you can can log in to MyChart online to view your results and a brief explanation. Also, we can discuss your results together at your next office visit if you would like.  2. Continue your fish oil 2 capsules once daily.    3. Continue all other medications I prescribe without changes.  Please schedule a follow-up appointment with Cassell Smiles, AGNP. Return in about 3 months (around 06/25/2017) for hypertension, hypertriglyceridemia.    If you have any other questions or concerns, please feel free to call the clinic or send a message through [name]. You may also schedule an earlier appointment if necessary.  You will receive a survey after today's visit either digitally by e-mail or paper by [employer]. Your experiences and feedback matter to [place].  Please respond so we know how we are doing as we provide care for you.   Cassell Smiles, DNP, AGNP-BC Adult Gerontology Nurse Practitioner [place]

## 2017-03-28 NOTE — Progress Notes (Signed)
Subjective:    Patient ID: Vanessa Baker, female    DOB: 05/17/1975, 42 y.o.   MRN: [id number]  Vanessa Baker is a 42 y.o. female presenting on 03/28/2017 for Follow-up (hyrpertension and hyperlipidemia, discuss pap smear, head congestion)   HPI Hypertension - She is not checking BP at home or outside of clinic.   But has a machine at home - Current medications: lisinopril 10 mg once daily, tolerating well without side effects - She is not currently symptomatic. - Pt denies headache, lightheadedness, dizziness, changes in vision, chest tightness/pressure, palpitations, leg swelling, sudden loss of speech or loss of consciousness. - She  reports no regular exercise routine.  Has had routine in past.  - Her diet is low in salt, moderate in fat, and high in carbohydrates.  Hypertriglyceridemia -  Pt has had high triglycerides and has increased fish oil.  Is tolerating well and has not had any new abdominal pain.  Pt reports no prior pancreatitis.  Is not improving diet.  Pap Smear -  Last in April 2017.  Has had HPV positive in past, but this last PAP was HPV negative.   Head Congestion Last couple of days has had increased sinus pressure, nasal congestion, sinus congestion, rhinorrhea, non-productive cough, "slight" fever, and having some sweats.  - Has had no sick contacts.  However, had gone to church and may have had some sick exposures. - Reports no OTC medication use.  - Tobacco Use: Smokes about 1/2 ppd.  Pt reports she had tried to quit in past, but is unable to.  Temper flares and she wants cigarette for stress management.  Not ready to quit at this time.  Social History   Tobacco Use  . Smoking status: Current Every Day Smoker    Packs/day: 0.50    Years: 27.00    Pack years: 13.50    Types: Cigarettes  . Smokeless tobacco: Never Used  Substance Use Topics  . Alcohol use: Yes    Alcohol/week: 4.8 - 5.4 oz    Types: 3 Glasses of wine, 5 - 6 Cans of  beer per week    Comment: monthly  . Drug use: No    Review of Systems Per HPI unless specifically indicated above     Objective:    BP 131/72 (BP Location: Right Arm, Patient Position: Sitting, Cuff Size: Normal)   Pulse 86   Temp 99.1 F (37.3 C)   Ht 5\' 2"  (1.575 m)   Wt 183 lb (83 kg)   BMI 33.47 kg/m   Wt Readings from Last 3 Encounters:  03/28/17 183 lb (83 kg)  02/28/17 182 lb 9.6 oz (82.8 kg)  01/02/17 185 lb (83.9 kg)    Physical Exam  General - Overweight, well-appearing, NAD HEENT - Normocephalic, atraumatic, PERRL, EOMI, edematous nares w/ mild congestion, oropharynx clear, MMM, TM normal, Ear canal normal, external ear normal w/o lesions Neck - supple, non-tender, mild anterior cervical LAD Heart - RRR, no murmurs heard Lungs - Clear throughout all lobes, no wheezing, crackles, or rhonchi. Normal work of breathing. Extremeties - non-tender, no edema, cap refill < 2 seconds, peripheral pulses intact +2 bilaterally Skin - warm, dry, no rashes Neuro - awake, alert, oriented x3, normal gait Psych - Normal mood and affect, normal behavior   Results for orders placed or performed in visit on 02/28/17  Comprehensive metabolic panel  Result Value Ref Range   Glucose, Bld 88 65 - 99 mg/dL   BUN  12 7 - 25 mg/dL   Creat 0.58 0.50 - 1.10 mg/dL   BUN/Creatinine Ratio NOT APPLICABLE 6 - 22 (calc)   Sodium 136 135 - 146 mmol/L   Potassium 4.1 3.5 - 5.3 mmol/L   Chloride 101 98 - 110 mmol/L   CO2 29 20 - 32 mmol/L   Calcium 9.1 8.6 - 10.2 mg/dL   Total Protein 5.9 (L) 6.1 - 8.1 g/dL   Albumin 3.9 3.6 - 5.1 g/dL   Globulin 2.0 1.9 - 3.7 g/dL (calc)   AG Ratio 2.0 1.0 - 2.5 (calc)   Total Bilirubin 0.2 0.2 - 1.2 mg/dL   Alkaline phosphatase (APISO) 58 33 - 115 U/L   AST 9 (L) 10 - 30 U/L   ALT 12 6 - 29 U/L  Lipid panel  Result Value Ref Range   Cholesterol 150 <200 mg/dL   HDL 26 (L) >50 mg/dL   Triglycerides 385 (H) <150 mg/dL   LDL Cholesterol (Calc) 78 mg/dL  (calc)   Total CHOL/HDL Ratio 5.8 (H) <5.0 (calc)   Non-HDL Cholesterol (Calc) 124 <130 mg/dL (calc)  POCT urine pregnancy  Result Value Ref Range   Preg Test, Ur Negative Negative      Assessment & Plan:   Problem List Items Addressed This Visit      Cardiovascular and Mediastinum   Essential hypertension - Primary Controlled hypertension.  BP goal < 130/80.  Pt is not currently working on lifestyle modifications.  Taking medications tolerating well without side effects. No current complications.  Plan: 1. Continue taking lisinopril 10 mg once daily 2. Obtain labs at next visit.  Last kidney function normal.  3. Encouraged heart healthy diet and increasing exercise to 30 minutes most days of the week. 4. Check BP 1-2 x per week at home, keep log, and bring to clinic at next appointment. 5. Follow up 3 months.     Relevant Medications   omega-3 acid ethyl esters (LOVAZA) 1 g capsule    Other Visit Diagnoses    Visit for screening mammogram    -   Pt last mammogram > 2 years ago. Pt prefers to repeat mammo.  Reports family history of breast cancer.  Plan: 1. Screening mammogram order placed.  Pt will call to schedule appointment.  Information given.     Relevant Orders   MM DIGITAL SCREENING BILATERAL   Mild protein malnutrition ([place])     Pt with teeth extracted and no dentures yet.  Is not eating regular protein sources. High carbohydrate amount currently, which is not conducive to diet management of chronic conditions (HTN, hypertriglyceridemia).  Plan: 1. Start using one protein supplement daily. 2. Followup as needed.   Relevant Medications   protein supplement (UNJURY CHOCOLATE CLASSIC) POWD   Hypertriglyceridemia     Stable on last lab check.  Improved with fish oil.  Pt taking and tolerating well.  Plan: 1. Continue fish oil.  Take 1 capsule twice daily. 2. Recheck lipid 3 months. 3. Encouraged low glycemic diet (handout provided). 4.Followup as needed.    Relevant Medications   omega-3 acid ethyl esters (LOVAZA) 1 g capsule      Meds ordered this encounter  Medications  . protein supplement (UNJURY CHOCOLATE CLASSIC) POWD    Sig: Take 7 g (2 oz total) by mouth daily.    Dispense:  210 g    Refill:  8    Order Specific Question:   Supervising Provider    Answer:   Parks Ranger,  ALEXANDER J [[id number]]  . omega-3 acid ethyl esters (LOVAZA) 1 g capsule    Sig: Take 1 capsule (1 g total) by mouth 2 (two) times daily.    Dispense:  60 capsule    Refill:  5    Order Specific Question:   Supervising Provider    Answer:   Olin Hauser [[id number]]      Follow up plan: Return in about 3 months (around 06/25/2017) for hypertension, hypertriglyceridemia.  Cassell Smiles, DNP, AGPCNP-BC Adult Gerontology Primary Care Nurse Practitioner [facility] 04/04/2017, 11:09 PM

## 2017-04-02 ENCOUNTER — Other Ambulatory Visit: Payer: Self-pay

## 2017-04-02 ENCOUNTER — Ambulatory Visit: Payer: Medicare Other | Admitting: Psychiatry

## 2017-04-02 ENCOUNTER — Encounter: Payer: Self-pay | Admitting: Psychiatry

## 2017-04-02 VITALS — BP 143/81 | HR 93 | Temp 98.9°F | Wt 182.4 lb

## 2017-04-02 DIAGNOSIS — F17200 Nicotine dependence, unspecified, uncomplicated: Secondary | ICD-10-CM

## 2017-04-02 DIAGNOSIS — F1211 Cannabis abuse, in remission: Secondary | ICD-10-CM | POA: Diagnosis not present

## 2017-04-02 DIAGNOSIS — F4310 Post-traumatic stress disorder, unspecified: Secondary | ICD-10-CM

## 2017-04-02 DIAGNOSIS — F3130 Bipolar disorder, current episode depressed, mild or moderate severity, unspecified: Secondary | ICD-10-CM

## 2017-04-02 DIAGNOSIS — G250 Essential tremor: Secondary | ICD-10-CM | POA: Insufficient documentation

## 2017-04-02 DIAGNOSIS — F1010 Alcohol abuse, uncomplicated: Secondary | ICD-10-CM

## 2017-04-02 DIAGNOSIS — G43909 Migraine, unspecified, not intractable, without status migrainosus: Secondary | ICD-10-CM

## 2017-04-02 DIAGNOSIS — G2119 Other drug induced secondary parkinsonism: Secondary | ICD-10-CM | POA: Diagnosis not present

## 2017-04-02 DIAGNOSIS — F410 Panic disorder [episodic paroxysmal anxiety] without agoraphobia: Secondary | ICD-10-CM | POA: Diagnosis not present

## 2017-04-02 DIAGNOSIS — F1411 Cocaine abuse, in remission: Secondary | ICD-10-CM | POA: Diagnosis not present

## 2017-04-02 HISTORY — DX: Migraine, unspecified, not intractable, without status migrainosus: G43.909

## 2017-04-02 MED ORDER — OLANZAPINE 5 MG PO TABS: 5.0000 mg | ORAL_TABLET | Freq: Every day | ORAL | 1 refills | Status: DC

## 2017-04-02 MED ORDER — TRIHEXYPHENIDYL HCL 5 MG PO TABS: 5.0000 mg | ORAL_TABLET | Freq: Three times a day (TID) | ORAL | 1 refills | Status: DC

## 2017-04-02 MED ORDER — DIVALPROEX SODIUM 500 MG PO DR TAB: 500.0000 mg | DELAYED_RELEASE_TABLET | Freq: Three times a day (TID) | ORAL | 0 refills | Status: DC

## 2017-04-02 MED ORDER — SERTRALINE HCL 100 MG PO TABS: 100.0000 mg | ORAL_TABLET | Freq: Every day | ORAL | 1 refills | Status: DC

## 2017-04-02 MED ORDER — TRAZODONE HCL 50 MG PO TABS: 25.0000 mg | ORAL_TABLET | Freq: Every evening | ORAL | 1 refills | Status: DC | PRN

## 2017-04-02 NOTE — Patient Instructions (Signed)
Trazodone tablets What is this medicine? TRAZODONE (TRAZ oh done) is used to treat depression. This medicine may be used for other purposes; ask your health care provider or pharmacist if you have questions. COMMON BRAND NAME(S): Desyrel What should I tell my health care provider before I take this medicine? They need to know if you have any of these conditions: -attempted suicide or thinking about it -bipolar disorder -bleeding problems -glaucoma -heart disease, or previous heart attack -irregular heart beat -kidney or liver disease -low levels of sodium in the blood -an unusual or allergic reaction to trazodone, other medicines, foods, dyes or preservatives -pregnant or trying to get pregnant -breast-feeding How should I use this medicine? Take this medicine by mouth with a glass of water. Follow the directions on the prescription label. Take this medicine shortly after a meal or a light snack. Take your medicine at regular intervals. Do not take your medicine more often than directed. Do not stop taking this medicine suddenly except upon the advice of your doctor. Stopping this medicine too quickly may cause serious side effects or your condition may worsen. A special MedGuide will be given to you by the pharmacist with each prescription and refill. Be sure to read this information carefully each time. Talk to your pediatrician regarding the use of this medicine in children. Special care may be needed. Overdosage: If you think you have taken too much of this medicine contact a poison control center or emergency room at once. NOTE: This medicine is only for you. Do not share this medicine with others. What if I miss a dose? If you miss a dose, take it as soon as you can. If it is almost time for your next dose, take only that dose. Do not take double or extra doses. What may interact with this medicine? Do not take this medicine with any of the following medications: -certain medicines  for fungal infections like fluconazole, itraconazole, ketoconazole, posaconazole, voriconazole -cisapride -dofetilide -dronedarone -linezolid -MAOIs like Carbex, Eldepryl, Marplan, Nardil, and Parnate -mesoridazine -methylene blue (injected into a vein) -pimozide -saquinavir -thioridazine -ziprasidone This medicine may also interact with the following medications: -alcohol -antiviral medicines for HIV or AIDS -aspirin and aspirin-like medicines -barbiturates like phenobarbital -certain medicines for blood pressure, heart disease, irregular heart beat -certain medicines for depression, anxiety, or psychotic disturbances -certain medicines for migraine headache like almotriptan, eletriptan, frovatriptan, naratriptan, rizatriptan, sumatriptan, zolmitriptan -certain medicines for seizures like carbamazepine and phenytoin -certain medicines for sleep -certain medicines that treat or prevent blood clots like dalteparin, enoxaparin, warfarin -digoxin -fentanyl -lithium -NSAIDS, medicines for pain and inflammation, like ibuprofen or naproxen -other medicines that prolong the QT interval (cause an abnormal heart rhythm) -rasagiline -supplements like St. John's wort, kava kava, valerian -tramadol -tryptophan This list may not describe all possible interactions. Give your health care provider a list of all the medicines, herbs, non-prescription drugs, or dietary supplements you use. Also tell them if you smoke, drink alcohol, or use illegal drugs. Some items may interact with your medicine. What should I watch for while using this medicine? Tell your doctor if your symptoms do not get better or if they get worse. Visit your doctor or health care professional for regular checks on your progress. Because it may take several weeks to see the full effects of this medicine, it is important to continue your treatment as prescribed by your doctor. Patients and their families should watch out for new  or worsening thoughts of suicide or depression. Also   watch out for sudden changes in feelings such as feeling anxious, agitated, panicky, irritable, hostile, aggressive, impulsive, severely restless, overly excited and hyperactive, or not being able to sleep. If this happens, especially at the beginning of treatment or after a change in dose, call your health care professional. Dennis Bast may get drowsy or dizzy. Do not drive, use machinery, or do anything that needs mental alertness until you know how this medicine affects you. Do not stand or sit up quickly, especially if you are an older patient. This reduces the risk of dizzy or fainting spells. Alcohol may interfere with the effect of this medicine. Avoid alcoholic drinks. This medicine may cause dry eyes and blurred vision. If you wear contact lenses you may feel some discomfort. Lubricating drops may help. See your eye doctor if the problem does not go away or is severe. Your mouth may get dry. Chewing sugarless gum, sucking hard candy and drinking plenty of water may help. Contact your doctor if the problem does not go away or is severe. What side effects may I notice from receiving this medicine? Side effects that you should report to your doctor or health care professional as soon as possible: -allergic reactions like skin rash, itching or hives, swelling of the face, lips, or tongue -elevated mood, decreased need for sleep, racing thoughts, impulsive behavior -confusion -fast, irregular heartbeat -feeling faint or lightheaded, falls -feeling agitated, angry, or irritable -loss of balance or coordination -painful or prolonged erections -restlessness, pacing, inability to keep still -suicidal thoughts or other mood changes -tremors -trouble sleeping -seizures -unusual bleeding or bruising Side effects that usually do not require medical attention (report to your doctor or health care professional if they continue or are bothersome): -change in  sex drive or performance -change in appetite or weight -constipation -headache -muscle aches or pains -nausea This list may not describe all possible side effects. Call your doctor for medical advice about side effects. You may report side effects to FDA at 1-800-FDA-1088. Where should I keep my medicine? Keep out of the reach of children. Store at room temperature between 15 and 30 degrees C (59 to 86 degrees F). Protect from light. Keep container tightly closed. Throw away any unused medicine after the expiration date. NOTE: This sheet is a summary. It may not cover all possible information. If you have questions about this medicine, talk to your doctor, pharmacist, or health care provider.  2018 [employer] Standard (2015-06-23 16:57:05) Olanzapine tablets What is this medicine? OLANZAPINE (oh LAN za peen) is used to treat schizophrenia, psychotic disorders, and bipolar disorder. Bipolar disorder is also known as manic-depression. This medicine may be used for other purposes; ask your health care provider or pharmacist if you have questions. COMMON BRAND NAME(S): Zyprexa What should I tell my health care provider before I take this medicine? They need to know if you have any of these conditions: -breast cancer or history of breast cancer -cigarette smoker -dementia -diabetes mellitus, high blood sugar or a family history of diabetes -difficulty swallowing -glaucoma -heart disease, irregular heartbeat, or previous heart attack -history of brain tumor or head injury -kidney or liver disease -low blood pressure or dizziness when standing up -Parkinson's disease -prostate trouble -seizures (convulsions) -suicidal thoughts, plans, or attempt by you or a family member -an unusual or allergic reaction to olanzapine, other medicines, foods, dyes, or preservatives -pregnant or trying to get pregnant -breast-feeding How should I use this medicine? Take this medicine by mouth. Swallow  it with a  drink of water. Follow the directions on the prescription label. Take your medicine at regular intervals. Do not take it more often than directed. Do not stop taking except on the advice of your doctor or health care professional. A special MedGuide will be given to you by the pharmacist with each new prescription and refill. Be sure to read this information carefully each time. Talk to your pediatrician regarding the use of this medicine in children. While this drug may be prescribed for children as young as 13 years for selected conditions, precautions do apply. Overdosage: If you think you have taken too much of this medicine contact a poison control center or emergency room at once. NOTE: This medicine is only for you. Do not share this medicine with others. What if I miss a dose? If you miss a dose, take it as soon as you can. If it is almost time for your next dose, take only that dose. Do not take double or extra doses. What may interact with this medicine? Do not take this medicine with any of the following medications: -certain antibiotics like grepafloxacin and sparfloxacin -certain phenothiazines like chlorpromazine, mesoridazine, and thioridazine -cisapride -clozapine -droperidol -halofantrine -levomethadyl -pimozide This medicine may also interact with the following medications: -carbamazepine -charcoal -fluvoxamine -levodopa and other medicines for Parkinson's disease -medicines for diabetes -medicines for high blood pressure -medicines for mental depression, anxiety, other mood disorders, or sleeping problems -omeprazole -rifampin -ritonavir -tobacco from cigarettes This list may not describe all possible interactions. Give your health care provider a list of all the medicines, herbs, non-prescription drugs, or dietary supplements you use. Also tell them if you smoke, drink alcohol, or use illegal drugs. Some items may interact with your medicine. What should I  watch for while using this medicine? Visit your doctor or health care professional for regular checks on your progress. It may be several weeks before you see the full effects of this medicine. Notify your doctor or health care professional if your symptoms get worse, if you have new symptoms, if you are having an unusual effect from this medicine, or if you feel out of control, very discouraged or think you might harm yourself or others. Do not suddenly stop taking this medicine. You may need to gradually reduce the dose. Ask your doctor or health care professional for advice. You may get dizzy or drowsy. Do not drive, use machinery, or do anything that needs mental alertness until you know how this medicine affects you. Do not stand or sit up quickly, especially if you are an older patient. This reduces the risk of dizzy or fainting spells. Avoid alcoholic drinks. Alcohol can increase dizziness and drowsiness with olanzapine. Do not treat yourself for colds, diarrhea or allergies without asking your doctor or health care professional for advice. Some ingredients can increase possible side effects. Your mouth may get dry. Chewing sugarless gum or sucking hard candy, and drinking plenty of water will help. This medicine can reduce the response of your body to heat or cold. Dress warm in cold weather and stay hydrated in hot weather. If possible, avoid extreme temperatures like saunas, hot tubs, very hot or cold showers, or activities that can cause dehydration such as vigorous exercise. If you notice an increased hunger or thirst, different from your normal hunger or thirst, or if you find that you have to urinate more frequently, you should contact your health care provider as soon as possible. You may need to have your blood sugar monitored.  This medicine may cause changes in your blood sugar levels. You should monitor you blood sugar frequently if you have diabetes. If you smoke, tell your doctor if you  notice this medicine is not working well for you. Talk to your doctor if you are a smoker or if you decide to stop smoking. What side effects may I notice from receiving this medicine? Side effects that you should report to your doctor or health care professional as soon as possible: -allergic reactions like skin rash, itching or hives, swelling of the face, lips, or tongue -breathing problems -difficulty in speaking or swallowing -excessive thirst and/or hunger -fast heartbeat (palpitations) -fever or chills, sore throat -fever with rash, swollen lymph nodes, or swelling of the face -frequently needing to urinate -inability to control muscle movements in the face, hands, arms, or legs -painful or prolonged erections -redness, blistering, peeling or loosening of the skin, including inside the mouth -restlessness or need to keep moving -seizures (convulsions) -stiffness, spasms -tremors or trembling Side effects that usually do not require medical attention (report to your doctor or health care professional if they continue or are bothersome): -changes in sexual desire -constipation -drowsiness -lowered blood pressure This list may not describe all possible side effects. Call your doctor for medical advice about side effects. You may report side effects to FDA at 1-800-FDA-1088. Where should I keep my medicine? Keep out of the reach of children. Store at controlled room temperature between 15 and 30 degrees C (59 and 86 degrees F). Protect from light and moisture. Throw away any unused medicine after the expiration date. NOTE: This sheet is a summary. It may not cover all possible information. If you have questions about this medicine, talk to your doctor, pharmacist, or health care provider.  2018 [employer] Standard (2014-06-15 17:33:14)
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
## 2017-04-03 ENCOUNTER — Encounter: Payer: Self-pay | Admitting: Psychiatry

## 2017-04-04 ENCOUNTER — Encounter: Payer: Self-pay | Admitting: Nurse Practitioner

## 2017-04-05 ENCOUNTER — Encounter: Payer: Self-pay | Admitting: Nurse Practitioner

## 2017-04-05 DIAGNOSIS — N930 Postcoital and contact bleeding: Secondary | ICD-10-CM

## 2017-04-08 ENCOUNTER — Telehealth: Payer: Self-pay

## 2017-04-08 NOTE — Telephone Encounter (Signed)
Shawn , I am unable to comment on anyone's ability to provide plasma. May be she should talk to her PMD

## 2017-04-08 NOTE — Telephone Encounter (Signed)
Medication management - Telephone message from patient questioning why Dr. Shea Evans would not allow her to give plasma.  States this is a second income for her and wants to be able to donate.  Patient requests an explanation for not being supported.

## 2017-04-09 ENCOUNTER — Telehealth: Payer: Self-pay

## 2017-04-09 NOTE — Telephone Encounter (Signed)
Vanessa Baker , I cannot comment on this patient's ability to provide plasma. I would recommend she go to her PMD .

## 2017-04-09 NOTE — Telephone Encounter (Signed)
pt called states that she gave a form to be filled out about donating plasma and pt states that the plasma center states she could not donate because dr. Shea Evans said so she states that is her why of getting extra income and needs it.

## 2017-04-11 NOTE — Telephone Encounter (Signed)
Pt and her husband come here to the office to find out why.I told them that I could not see the form that it has not been scan into the system yet.  I will try and find copy of the form.  They are going back to the plasma center and get copy of form.

## 2017-04-11 NOTE — Telephone Encounter (Signed)
Pt and her husband came to office today. Pt was told that doctors at our office normally don't want to fill out the forms and they usually put on the form that I doctor not able to comment on anyone ability to provide plasma.  Usually it is depends on the medication you take or if you have a substance abuse issues

## 2017-04-22 ENCOUNTER — Telehealth: Payer: Self-pay

## 2017-04-22 ENCOUNTER — Other Ambulatory Visit
Admission: RE | Admit: 2017-04-22 | Discharge: 2017-04-22 | Disposition: A | Discharge status: Home / Self Care | Payer: Medicare Other | Source: Ambulatory Visit | Attending: Psychiatry | Admitting: Psychiatry

## 2017-04-22 DIAGNOSIS — F329 Major depressive disorder, single episode, unspecified: Secondary | ICD-10-CM | POA: Diagnosis present

## 2017-04-22 DIAGNOSIS — F3130 Bipolar disorder, current episode depressed, mild or moderate severity, unspecified: Secondary | ICD-10-CM

## 2017-04-22 LAB — HEMOGLOBIN A1C
Hgb A1c MFr Bld: 6.2 % — ABNORMAL HIGH (ref 4.8–5.6)
Mean Plasma Glucose: 131.24 mg/dL

## 2017-04-22 LAB — VALPROIC ACID LEVEL: Valproic Acid Lvl: <10 ug/mL — ABNORMAL LOW (ref 50.0–100.0)

## 2017-04-22 MED ORDER — DIVALPROEX SODIUM 500 MG PO DR TAB: 500.0000 mg | DELAYED_RELEASE_TABLET | Freq: Three times a day (TID) | ORAL | 0 refills | Status: DC

## 2017-04-22 NOTE — Telephone Encounter (Signed)
pt came by office states that she has appt for the 26th but she does not have enough medication to due until times for her depakote

## 2017-04-22 NOTE — Telephone Encounter (Signed)
Called patient to discuss her depakote refill . Unable to talk to her since her phone is not clear . I was only able to tell her that I will send her depakote to her pharmacy , but I have not received her depakote levels yet and that she needs to get it done.

## 2017-04-23 LAB — PROLACTIN: Prolactin: 4.6 ng/mL — ABNORMAL LOW (ref 4.8–23.3)

## 2017-04-23 LAB — VITAMIN D 25 HYDROXY (VIT D DEFICIENCY, FRACTURES): Vit D, 25-Hydroxy: 26.9 ng/mL — ABNORMAL LOW (ref 30.0–100.0)

## 2017-04-25 ENCOUNTER — Ambulatory Visit: Payer: Self-pay

## 2017-04-26 ENCOUNTER — Encounter: Payer: Self-pay | Admitting: Nurse Practitioner

## 2017-04-26 DIAGNOSIS — I10 Essential (primary) hypertension: Secondary | ICD-10-CM

## 2017-04-29 ENCOUNTER — Other Ambulatory Visit: Payer: Self-pay

## 2017-04-29 DIAGNOSIS — I10 Essential (primary) hypertension: Secondary | ICD-10-CM

## 2017-04-29 MED ORDER — ATORVASTATIN CALCIUM 20 MG PO TABS: 20.0000 mg | ORAL_TABLET | Freq: Every day | ORAL | 0 refills | Status: DC

## 2017-04-29 MED ORDER — LISINOPRIL 10 MG PO TABS: 10.0000 mg | ORAL_TABLET | Freq: Every day | ORAL | 0 refills | Status: DC

## 2017-04-29 NOTE — Telephone Encounter (Signed)
Refilled Lisinopril.  Nobie Putnam, [facility] 04/29/2017, 12:13 PM

## 2017-04-29 NOTE — Addendum Note (Signed)
Addended by: Olin Hauser on: 04/29/2017 12:15 PM   Modules accepted: Orders

## 2017-04-30 ENCOUNTER — Ambulatory Visit: Payer: Medicare Other | Admitting: Psychiatry

## 2017-05-01 ENCOUNTER — Encounter: Payer: Self-pay | Admitting: Obstetrics and Gynecology

## 2017-05-09 ENCOUNTER — Ambulatory Visit: Payer: Self-pay | Admitting: Nurse Practitioner

## 2017-05-13 ENCOUNTER — Encounter: Payer: Self-pay | Admitting: Nurse Practitioner

## 2017-05-13 ENCOUNTER — Ambulatory Visit (INDEPENDENT_AMBULATORY_CARE_PROVIDER_SITE_OTHER): Payer: Medicare Other | Admitting: Nurse Practitioner

## 2017-05-13 ENCOUNTER — Other Ambulatory Visit: Payer: Self-pay

## 2017-05-13 VITALS — BP 111/73 | HR 89 | Temp 98.3°F | Ht 62.0 in | Wt 186.0 lb

## 2017-05-13 DIAGNOSIS — N941 Unspecified dyspareunia: Secondary | ICD-10-CM | POA: Diagnosis not present

## 2017-05-13 DIAGNOSIS — R102 Pelvic and perineal pain: Secondary | ICD-10-CM

## 2017-05-13 LAB — POCT WET PREP (WET MOUNT)
Clue Cells Wet Prep Whiff POC: NEGATIVE
Trichomonas Wet Prep HPF POC: ABSENT

## 2017-05-13 NOTE — Patient Instructions (Addendum)
Vanessa Baker,   Thank you for coming in to clinic today.  1. We have tested for all STDs today.   These can cause pelvic pain.    2. Return for labs this week in the morning.  You can eat breakfast. - Schedule a 3 month lab visit and a follow-up visit to discuss prediabetes.  3.  If all tests are negative, we will send for pelvic ultrasound to check for possible ovarian cyst.  If that is also negative, we will recommend pelvic floor physical therapy.  Please schedule a follow-up appointment with Cassell Smiles, AGNP. Return in about 3 months (around 08/12/2017) for pre-diabetes AND labs prior.  If you have any other questions or concerns, please feel free to call the clinic or send a message through [place]. You may also schedule an earlier appointment if necessary.  You will receive a survey after today's visit either digitally by e-mail or paper by [employer]. Your experiences and feedback matter to [place].  Please respond so we know how we are doing as we provide care for you.   Cassell Smiles, DNP, AGNP-BC Adult Gerontology Nurse Practitioner [facility]

## 2017-05-13 NOTE — Progress Notes (Signed)
Subjective:    Patient ID: Vanessa Baker, female    DOB: 16-Apr-1975, 42 y.o.   MRN: [id number]  Vanessa Baker is a 42 y.o. female presenting on 05/13/2017 for Pelvic Pain (intermittent left side pelvic pain during urination and intercouse x 3 weeks.)   HPI  Pelvic Pain Last bleeding with intercourse was about 2 months ago.  Pt notes pain with intercourse continues and is preventing enjoyable sexual activities.  Pain is persistent over the last 2 months with sexual activity, but only in certain positions with more downward force.  Always has pain with urination.  Feels pressure, worse on left side of pelvis with urination.  Also with downward force or Left lateral force during intercourse.  Social History   Tobacco Use  . Smoking status: Current Every Day Smoker    Packs/day: 0.50    Years: 27.00    Pack years: 13.50    Types: Cigarettes  . Smokeless tobacco: Never Used  Substance Use Topics  . Alcohol use: Yes    Alcohol/week: 4.8 - 5.4 oz    Types: 3 Glasses of wine, 5 - 6 Cans of beer per week    Comment: monthly  . Drug use: No    Review of Systems Per HPI unless specifically indicated above     Objective:    BP 111/73 (BP Location: Right Arm, Patient Position: Sitting, Cuff Size: Normal)   Pulse 89   Temp 98.3 F (36.8 C) (Oral)   Ht 5\' 2"  (1.575 m)   Wt 186 lb (84.4 kg)   BMI 34.02 kg/m   Wt Readings from Last 3 Encounters:  05/13/17 186 lb (84.4 kg)  03/28/17 183 lb (83 kg)  02/28/17 182 lb 9.6 oz (82.8 kg)    Physical Exam  Constitutional: She is oriented to person, place, and time. She appears well-developed and well-nourished. No distress.  HENT:  Head: Normocephalic and atraumatic.  Cardiovascular: Normal rate, regular rhythm, S1 normal, S2 normal, normal heart sounds and intact distal pulses.  Pulmonary/Chest: Effort normal and breath sounds normal. No respiratory distress.  Abdominal: Soft. Bowel sounds are normal. She exhibits no  distension. There is no tenderness.  Genitourinary:  Genitourinary Comments: Normal external female genitalia without lesions or fusion. Vaginal canal without lesions, but decreased pelvic floor tone.  Normal appearing cervix without lesions or friability. Physiologic discharge on exam. Bimanual exam without adnexal masses, enlarged uterus, or cervical motion tenderness.  Neurological: She is alert and oriented to person, place, and time.  Skin: Skin is warm and dry.  Psychiatric: She has a normal mood and affect. Her behavior is normal.  Vitals reviewed.   Results for orders placed or performed in visit on 05/13/17  C. trachomatis/N. gonorrhoeae RNA  Result Value Ref Range   C. trachomatis RNA, TMA NOT DETECTED NOT DETECT   N. gonorrhoeae RNA, TMA NOT DETECTED NOT DETECT      Assessment & Plan:   Problem List Items Addressed This Visit    None    Visit Diagnoses    Pelvic pain    -  Primary   Relevant Orders   Ambulatory referral to Physical Therapy   HIV antibody (with reflex)   RPR   C. trachomatis/N. gonorrhoeae RNA (Completed)   POCT Wet Prep ([place])   US PELVIC COMPLETE WITH TRANSVAGINAL   Dyspareunia, female       Relevant Orders   Ambulatory referral to Physical Therapy   US PELVIC COMPLETE WITH TRANSVAGINAL  Chronic pelvic pain with urination and sexual intercourse x 2 months.  No improvement.  Intermittently is having vaginal bleeding without identified cause.  Plan: 1. US transvaginal and pelvis. 2. STD testing to rule out possible PID. 3. Recommend pelvic floor physical therapy. 4. Followup as needed after test results and in 3 months.  Follow up plan: Return in about 3 months (around 08/12/2017) for pre-diabetes AND labs prior.  Cassell Smiles, DNP, AGPCNP-BC Adult Gerontology Primary Care Nurse Practitioner [facility] 05/14/2017, 12:41 PM

## 2017-05-14 ENCOUNTER — Other Ambulatory Visit: Payer: Medicare Other

## 2017-05-14 ENCOUNTER — Encounter: Payer: Self-pay | Admitting: Nurse Practitioner

## 2017-05-14 LAB — C. TRACHOMATIS/N. GONORRHOEAE RNA
C. trachomatis RNA, TMA: NOT DETECTED
N. gonorrhoeae RNA, TMA: NOT DETECTED

## 2017-05-15 LAB — SYPHILIS: RPR W/REFLEX TO RPR TITER AND TREPONEMAL ANTIBODIES, TRADITIONAL SCREENING AND DIAGNOSIS ALGORITHM: RPR Ser Ql: NONREACTIVE

## 2017-05-15 LAB — HIV ANTIBODY (ROUTINE TESTING W REFLEX): HIV 1&2 Ab, 4th Generation: NONREACTIVE

## 2017-05-21 ENCOUNTER — Other Ambulatory Visit: Payer: Self-pay | Admitting: Psychiatry

## 2017-05-21 DIAGNOSIS — F3130 Bipolar disorder, current episode depressed, mild or moderate severity, unspecified: Secondary | ICD-10-CM

## 2017-05-22 ENCOUNTER — Telehealth: Payer: Self-pay

## 2017-05-22 NOTE — Telephone Encounter (Signed)
Appt scheduled for Friday, 4/19 @ 1:30pm. I attempted to contact the pt, no answer. I left a detail message with appt, date, time and location on the pt personal vm. I also left on the message the [place] instruction for a full bladder.

## 2017-05-23 ENCOUNTER — Ambulatory Visit: Payer: Medicare Other | Admitting: Psychiatry

## 2017-05-24 ENCOUNTER — Ambulatory Visit
Admission: RE | Admit: 2017-05-24 | Discharge: 2017-05-24 | Disposition: A | Discharge status: Home / Self Care | Payer: Medicare Other | Source: Ambulatory Visit | Attending: Nurse Practitioner | Admitting: Nurse Practitioner

## 2017-05-24 DIAGNOSIS — N941 Unspecified dyspareunia: Secondary | ICD-10-CM | POA: Diagnosis not present

## 2017-05-24 DIAGNOSIS — R9389 Abnormal findings on diagnostic imaging of other specified body structures: Secondary | ICD-10-CM | POA: Insufficient documentation

## 2017-05-24 DIAGNOSIS — R102 Pelvic and perineal pain: Secondary | ICD-10-CM | POA: Diagnosis not present

## 2017-05-28 ENCOUNTER — Emergency Department (HOSPITAL_COMMUNITY): Payer: Medicare Other

## 2017-05-28 ENCOUNTER — Encounter (HOSPITAL_COMMUNITY): Payer: Self-pay

## 2017-05-28 ENCOUNTER — Emergency Department (HOSPITAL_COMMUNITY)
Admission: EM | Admit: 2017-05-28 | Discharge: 2017-05-28 | Disposition: A | Discharge status: Home / Self Care | Payer: Medicare Other | Attending: Emergency Medicine | Admitting: Emergency Medicine

## 2017-05-28 DIAGNOSIS — Y999 Unspecified external cause status: Secondary | ICD-10-CM | POA: Insufficient documentation

## 2017-05-28 DIAGNOSIS — Y92410 Unspecified street and highway as the place of occurrence of the external cause: Secondary | ICD-10-CM | POA: Diagnosis not present

## 2017-05-28 DIAGNOSIS — R51 Headache: Secondary | ICD-10-CM | POA: Insufficient documentation

## 2017-05-28 DIAGNOSIS — Z79899 Other long term (current) drug therapy: Secondary | ICD-10-CM | POA: Insufficient documentation

## 2017-05-28 DIAGNOSIS — R0789 Other chest pain: Secondary | ICD-10-CM

## 2017-05-28 DIAGNOSIS — F17210 Nicotine dependence, cigarettes, uncomplicated: Secondary | ICD-10-CM | POA: Insufficient documentation

## 2017-05-28 DIAGNOSIS — Y9389 Activity, other specified: Secondary | ICD-10-CM | POA: Insufficient documentation

## 2017-05-28 DIAGNOSIS — J45909 Unspecified asthma, uncomplicated: Secondary | ICD-10-CM | POA: Diagnosis not present

## 2017-05-28 DIAGNOSIS — I10 Essential (primary) hypertension: Secondary | ICD-10-CM | POA: Insufficient documentation

## 2017-05-28 DIAGNOSIS — S3991XA Unspecified injury of abdomen, initial encounter: Secondary | ICD-10-CM | POA: Diagnosis present

## 2017-05-28 DIAGNOSIS — S301XXA Contusion of abdominal wall, initial encounter: Secondary | ICD-10-CM | POA: Insufficient documentation

## 2017-05-28 LAB — COMPREHENSIVE METABOLIC PANEL
ALT: 22 U/L (ref 14–54)
AST: 28 U/L (ref 15–41)
Albumin: 3.1 g/dL — ABNORMAL LOW (ref 3.5–5.0)
Alkaline Phosphatase: 49 U/L (ref 38–126)
Anion gap: 8 (ref 5–15)
BUN: 12 mg/dL (ref 6–20)
CO2: 26 mmol/L (ref 22–32)
Calcium: 8.7 mg/dL — ABNORMAL LOW (ref 8.9–10.3)
Chloride: 101 mmol/L (ref 101–111)
Creatinine, Ser: 0.66 mg/dL (ref 0.44–1.00)
GFR calc Af Amer: >60 mL/min (ref >60)
GFR calc non Af Amer: >60 mL/min (ref >60)
Glucose, Bld: 104 mg/dL — ABNORMAL HIGH (ref 65–99)
Potassium: 4.7 mmol/L (ref 3.5–5.1)
Sodium: 135 mmol/L (ref 135–145)
Total Bilirubin: 0.3 mg/dL (ref 0.3–1.2)
Total Protein: 5.4 g/dL — ABNORMAL LOW (ref 6.5–8.1)

## 2017-05-28 LAB — CBC
HCT: 44 % (ref 36.0–46.0)
Hemoglobin: 14.5 g/dL (ref 12.0–15.0)
MCH: 30.9 pg (ref 26.0–34.0)
MCHC: 33 g/dL (ref 30.0–36.0)
MCV: 93.8 fL (ref 78.0–100.0)
Platelets: 280 10*3/uL (ref 150–400)
RBC: 4.69 MIL/uL (ref 3.87–5.11)
RDW: 13.7 % (ref 11.5–15.5)
WBC: 13.1 10*3/uL — ABNORMAL HIGH (ref 4.0–10.5)

## 2017-05-28 LAB — I-STAT BETA HCG BLOOD, ED (MC, WL, AP ONLY): I-stat hCG, quantitative: <5 m[IU]/mL (ref <5)

## 2017-05-28 MED ORDER — ONDANSETRON HCL 4 MG/2ML IJ SOLN
4.0000 mg | Freq: Once | INTRAMUSCULAR | Status: AC
Start: 2017-05-28 — End: 2017-05-28
  Administered 2017-05-28: 4 mg via INTRAVENOUS
  Filled 2017-05-28: qty 2

## 2017-05-28 MED ORDER — IOPAMIDOL (ISOVUE-300) INJECTION 61%
100.0000 mL | Freq: Once | INTRAVENOUS | Status: AC | PRN
  Administered 2017-05-28: 100 mL via INTRAVENOUS

## 2017-05-28 MED ORDER — NAPROXEN 500 MG PO TABS: 500.0000 mg | ORAL_TABLET | Freq: Two times a day (BID) | ORAL | 0 refills | Status: DC

## 2017-05-28 MED ORDER — METHOCARBAMOL 500 MG PO TABS: 500.0000 mg | ORAL_TABLET | Freq: Two times a day (BID) | ORAL | 0 refills | Status: DC | PRN

## 2017-05-28 MED ORDER — MORPHINE SULFATE (PF) 4 MG/ML IV SOLN: 4.0000 mg | Freq: Once | INTRAVENOUS | Status: DC

## 2017-05-28 MED ORDER — IOPAMIDOL (ISOVUE-300) INJECTION 61%
INTRAVENOUS | Status: AC
  Filled 2017-05-28: qty 100

## 2017-05-28 MED ORDER — MORPHINE SULFATE (PF) 4 MG/ML IV SOLN
4.0000 mg | Freq: Once | INTRAVENOUS | Status: AC
  Administered 2017-05-28: 4 mg via INTRAVENOUS
  Filled 2017-05-28: qty 1

## 2017-05-28 NOTE — Discharge Instructions (Addendum)
Take naproxen 2 times a day with meals.  Do not take other anti-inflammatories at the same time open (Advil, Motrin, ibuprofen, Aleve). You may supplement with Tylenol if you need further pain control. Use robaxin as needed for muscle stiffness or soreness.  Have caution, this may make you tired or groggy.  Do not drive or operate heavy machinery while taking this medicine. Use ice packs or heating pads if this helps control your pain. You will likely have continued muscle stiffness and soreness over the next couple days.  Follow-up with primary care in 1 week if your symptoms are not improving. Return to the emergency room if you develop vision changes, vomiting, slurred speech, numbness, severe abdominal pain, loss of bowel or bladder control, or any new or worsening symptoms.
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
## 2017-05-28 NOTE — ED Notes (Signed)
ED Provider at bedside. 

## 2017-06-05 ENCOUNTER — Encounter: Payer: Self-pay | Admitting: Nurse Practitioner

## 2017-06-06 ENCOUNTER — Ambulatory Visit: Payer: Self-pay | Admitting: Nurse Practitioner

## 2017-06-08 ENCOUNTER — Other Ambulatory Visit: Payer: Self-pay | Admitting: Psychiatry

## 2017-06-08 DIAGNOSIS — F3130 Bipolar disorder, current episode depressed, mild or moderate severity, unspecified: Secondary | ICD-10-CM

## 2017-06-08 DIAGNOSIS — F4310 Post-traumatic stress disorder, unspecified: Secondary | ICD-10-CM

## 2017-06-14 ENCOUNTER — Telehealth (HOSPITAL_COMMUNITY): Payer: Self-pay

## 2017-06-14 NOTE — Telephone Encounter (Signed)
Called and informed patient of message she voiced understanding. Transferred her to front desk to schedule an appointment.

## 2017-06-14 NOTE — Telephone Encounter (Signed)
Patient was seen for one appointment at our clinic in February and at that was advised to get labs as well as follow up in 4 weeks. Pt never followed recommendations. Please advise patient to schedule appointment today for the coming week and once that is schedule she will be given meds to last until then. Thanks.

## 2017-06-14 NOTE — Telephone Encounter (Signed)
Patient called requesting a refill on Depakote 500mg , Olanzapine 5mg , Trazodone 50mg , and Sertraline 100mg  tab. Script to be sent to [facility] in [place]. Patient doesn't have a future appointment scheduled.  Please advise

## 2017-06-17 ENCOUNTER — Ambulatory Visit (INDEPENDENT_AMBULATORY_CARE_PROVIDER_SITE_OTHER): Payer: Medicare Other | Admitting: Nurse Practitioner

## 2017-06-17 ENCOUNTER — Telehealth: Payer: Self-pay | Admitting: Nurse Practitioner

## 2017-06-17 ENCOUNTER — Encounter: Payer: Self-pay | Admitting: Nurse Practitioner

## 2017-06-17 ENCOUNTER — Other Ambulatory Visit: Payer: Self-pay

## 2017-06-17 DIAGNOSIS — J45909 Unspecified asthma, uncomplicated: Secondary | ICD-10-CM | POA: Diagnosis not present

## 2017-06-17 DIAGNOSIS — S301XXA Contusion of abdominal wall, initial encounter: Secondary | ICD-10-CM

## 2017-06-17 MED ORDER — ALBUTEROL SULFATE HFA 108 (90 BASE) MCG/ACT IN AERS: INHALATION_SPRAY | RESPIRATORY_TRACT | 5 refills | Status: DC

## 2017-06-17 MED ORDER — PREDNISONE 10 MG PO TABS: ORAL_TABLET | ORAL | 0 refills | Status: DC

## 2017-06-17 MED ORDER — BACLOFEN 10 MG PO TABS: 10.0000 mg | ORAL_TABLET | Freq: Three times a day (TID) | ORAL | 0 refills | Status: DC
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
## 2017-06-17 NOTE — Telephone Encounter (Signed)
Brandy at [facility] said prednisone 10 mg is on back order and asked it she could change to 20 mg [phone]
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
## 2017-06-18 ENCOUNTER — Encounter: Payer: Self-pay | Admitting: Nurse Practitioner

## 2017-06-18 ENCOUNTER — Telehealth: Payer: Self-pay | Admitting: Psychiatry

## 2017-06-18 DIAGNOSIS — S301XXA Contusion of abdominal wall, initial encounter: Secondary | ICD-10-CM

## 2017-06-18 DIAGNOSIS — R102 Pelvic and perineal pain: Secondary | ICD-10-CM

## 2017-06-18 DIAGNOSIS — F4310 Post-traumatic stress disorder, unspecified: Secondary | ICD-10-CM

## 2017-06-18 DIAGNOSIS — F3130 Bipolar disorder, current episode depressed, mild or moderate severity, unspecified: Secondary | ICD-10-CM

## 2017-06-18 DIAGNOSIS — G2119 Other drug induced secondary parkinsonism: Secondary | ICD-10-CM

## 2017-06-18 MED ORDER — SERTRALINE HCL 100 MG PO TABS: 100.0000 mg | ORAL_TABLET | Freq: Every day | ORAL | 0 refills | Status: DC

## 2017-06-18 MED ORDER — LIDOCAINE 4 % EX GEL: 1.0000 "application " | Freq: Three times a day (TID) | CUTANEOUS | 2 refills | Status: DC | PRN

## 2017-06-18 MED ORDER — TRIHEXYPHENIDYL HCL 5 MG PO TABS
5.0000 mg | ORAL_TABLET | Freq: Three times a day (TID) | ORAL | 0 refills | Status: DC
Start: 2017-06-18 — End: 2017-06-19

## 2017-06-18 MED ORDER — OLANZAPINE 5 MG PO TABS: 5.0000 mg | ORAL_TABLET | Freq: Every day | ORAL | 0 refills | Status: DC

## 2017-06-18 NOTE — Telephone Encounter (Signed)
Sent few days supply to make it till she sees me , for zoloft, trazodone, artane. Will not give depakote since she is not compliant with visits /labs.will await till she comes in for appoibntment

## 2017-06-19 ENCOUNTER — Ambulatory Visit: Payer: Medicare Other | Admitting: Psychiatry

## 2017-06-19 ENCOUNTER — Encounter: Payer: Self-pay | Admitting: Psychiatry

## 2017-06-19 ENCOUNTER — Other Ambulatory Visit: Payer: Self-pay

## 2017-06-19 VITALS — BP 129/84 | HR 97 | Temp 98.5°F | Wt 188.2 lb

## 2017-06-19 DIAGNOSIS — F17200 Nicotine dependence, unspecified, uncomplicated: Secondary | ICD-10-CM | POA: Diagnosis not present

## 2017-06-19 DIAGNOSIS — F1211 Cannabis abuse, in remission: Secondary | ICD-10-CM

## 2017-06-19 DIAGNOSIS — G2119 Other drug induced secondary parkinsonism: Secondary | ICD-10-CM | POA: Diagnosis not present

## 2017-06-19 DIAGNOSIS — F1010 Alcohol abuse, uncomplicated: Secondary | ICD-10-CM

## 2017-06-19 DIAGNOSIS — F4310 Post-traumatic stress disorder, unspecified: Secondary | ICD-10-CM

## 2017-06-19 DIAGNOSIS — F1411 Cocaine abuse, in remission: Secondary | ICD-10-CM

## 2017-06-19 DIAGNOSIS — F3130 Bipolar disorder, current episode depressed, mild or moderate severity, unspecified: Secondary | ICD-10-CM | POA: Diagnosis not present

## 2017-06-19 MED ORDER — DIVALPROEX SODIUM 500 MG PO DR TAB: DELAYED_RELEASE_TABLET | ORAL | 0 refills | Status: DC

## 2017-06-19 MED ORDER — SERTRALINE HCL 100 MG PO TABS: 100.0000 mg | ORAL_TABLET | Freq: Every day | ORAL | 0 refills | Status: DC

## 2017-06-19 MED ORDER — TRIHEXYPHENIDYL HCL 5 MG PO TABS: 5.0000 mg | ORAL_TABLET | Freq: Three times a day (TID) | ORAL | 0 refills | Status: DC

## 2017-06-19 MED ORDER — TRAZODONE HCL 50 MG PO TABS: 25.0000 mg | ORAL_TABLET | Freq: Every evening | ORAL | 0 refills | Status: DC | PRN

## 2017-06-19 MED ORDER — OLANZAPINE 10 MG PO TABS: 10.0000 mg | ORAL_TABLET | Freq: Every day | ORAL | 0 refills | Status: DC
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
## 2017-07-02 ENCOUNTER — Ambulatory Visit (INDEPENDENT_AMBULATORY_CARE_PROVIDER_SITE_OTHER): Payer: Medicare Other | Admitting: Surgery

## 2017-07-02 ENCOUNTER — Encounter: Payer: Self-pay | Admitting: Surgery

## 2017-07-02 VITALS — BP 126/82 | HR 106 | Temp 97.8°F | Ht 62.0 in | Wt 188.0 lb

## 2017-07-02 DIAGNOSIS — S301XXD Contusion of abdominal wall, subsequent encounter: Secondary | ICD-10-CM
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
## 2017-07-02 NOTE — Patient Instructions (Signed)
Please give us a call in case you have any questions or concerns.  

## 2017-07-04 ENCOUNTER — Encounter: Payer: Self-pay | Admitting: Surgery

## 2017-07-05 ENCOUNTER — Encounter: Payer: Self-pay | Admitting: Nurse Practitioner

## 2017-07-31 ENCOUNTER — Telehealth: Payer: Self-pay | Admitting: Nurse Practitioner

## 2017-07-31 NOTE — Telephone Encounter (Signed)
Called pt about [place]. She says she is feeling better and doesn't want to get this done

## 2017-07-31 NOTE — Telephone Encounter (Signed)
She may cancel this.  We can reorder at another time if symptoms return.

## 2017-08-13 ENCOUNTER — Ambulatory Visit: Payer: Self-pay

## 2017-08-13 ENCOUNTER — Other Ambulatory Visit: Payer: Self-pay

## 2017-08-13 ENCOUNTER — Encounter: Payer: Self-pay | Admitting: Nurse Practitioner

## 2017-08-13 ENCOUNTER — Ambulatory Visit: Payer: Self-pay | Admitting: Nurse Practitioner

## 2017-08-13 ENCOUNTER — Ambulatory Visit (INDEPENDENT_AMBULATORY_CARE_PROVIDER_SITE_OTHER): Payer: Medicare Other | Admitting: Nurse Practitioner

## 2017-08-13 VITALS — BP 123/48 | HR 80 | Temp 98.2°F | Resp 16 | Ht 62.0 in | Wt 189.8 lb

## 2017-08-13 DIAGNOSIS — Z79899 Other long term (current) drug therapy: Secondary | ICD-10-CM

## 2017-08-13 DIAGNOSIS — Z716 Tobacco abuse counseling: Secondary | ICD-10-CM | POA: Diagnosis not present

## 2017-08-13 DIAGNOSIS — E785 Hyperlipidemia, unspecified: Secondary | ICD-10-CM | POA: Diagnosis not present

## 2017-08-13 DIAGNOSIS — N926 Irregular menstruation, unspecified: Secondary | ICD-10-CM | POA: Diagnosis not present

## 2017-08-13 DIAGNOSIS — F319 Bipolar disorder, unspecified: Secondary | ICD-10-CM | POA: Diagnosis not present

## 2017-08-13 DIAGNOSIS — R7303 Prediabetes: Secondary | ICD-10-CM

## 2017-08-13 DIAGNOSIS — I10 Essential (primary) hypertension: Secondary | ICD-10-CM

## 2017-08-13 MED ORDER — VARENICLINE TARTRATE 0.5 MG X 11 & 1 MG X 42 PO MISC: ORAL | 0 refills | Status: DC

## 2017-08-13 MED ORDER — NORETHIN ACE-ETH ESTRAD-FE 1-20 MG-MCG PO TABS: 1.0000 | ORAL_TABLET | Freq: Every day | ORAL | 11 refills | Status: DC

## 2017-08-13 MED ORDER — LISINOPRIL 10 MG PO TABS: 10.0000 mg | ORAL_TABLET | Freq: Every day | ORAL | 0 refills | Status: DC

## 2017-08-13 NOTE — Progress Notes (Signed)
Subjective:    Patient ID: Vanessa Baker, female    DOB: 1975/09/04, 42 y.o.   MRN: [id number]  Vanessa Baker is a 42 y.o. female presenting on 08/13/2017 for restless leg (unable to rest at bedtime) and Menstrual Problem (pt states that her menstrual cycles are irregular. Shes concern that she could be going through permenopausal )   HPI Blood Pressure Home machine is reading 168/78 in clinic today 140-160/70-90.  Correlation to manual BP is not accurate. Occasionally feels lightheaded or dizzy, no increased headaches.  Otherwise no hypertension symptoms. Is taking no medications.  Was concerned with home BP reading machines and possible hypertension diagnosis.  Irregular menses Is continuing to have menstrual problems with irregular periods.  Is not having heavy bleeding.  Last 2 months are as follows: March 12-13: x 2 days; April 8-9: x 2 days; May - no period; June 27-29: x 2 days/ - Is continuing to have intermittent pelvic pain with bad cramping - Often like contractions.  "Would take my breath away."  Last pelvic US indicated ovarian cyst possible.  No recent OCP used.  Smoking Cessation Patient is ready to stop smoking.  Reasons are largely financial.  She is currently without cigarettes and is having moderately severe cravings.  She would like to try a medication to help her control cravings and quit smoking.  Chantix is specifically requested.  Social History   Tobacco Use  . Smoking status: Current Every Day Smoker    Packs/day: 0.50    Years: 27.00    Pack years: 13.50    Types: Cigarettes  . Smokeless tobacco: Never Used  Substance Use Topics  . Alcohol use: Yes    Alcohol/week: 4.8 - 5.4 oz    Types: 3 Glasses of wine, 5 - 6 Cans of beer per week    Comment: monthly  . Drug use: No    Review of Systems Per HPI unless specifically indicated above     Objective:    BP (!) 123/48 (BP Location: Right Arm, Patient Position: Sitting, Cuff Size:  Normal)   Pulse 80   Temp 98.2 F (36.8 C) (Oral)   Resp 16   Ht 5\' 2"  (1.575 m)   Wt 189 lb 12.8 oz (86.1 kg)   LMP 08/01/2017   SpO2 99%   BMI 34.71 kg/m   Wt Readings from Last 3 Encounters:  08/13/17 189 lb 12.8 oz (86.1 kg)  07/02/17 188 lb (85.3 kg)  06/17/17 188 lb (85.3 kg)    Physical Exam  Constitutional: She is oriented to person, place, and time. She appears well-developed and well-nourished. No distress.  HENT:  Head: Normocephalic and atraumatic.  Cardiovascular: Normal rate, regular rhythm, S1 normal, S2 normal, normal heart sounds and intact distal pulses.  Pulmonary/Chest: Effort normal and breath sounds normal. No respiratory distress.  Neurological: She is alert and oriented to person, place, and time. No cranial nerve deficit.  Skin: Skin is warm and dry. Capillary refill takes less than 2 seconds.  Psychiatric: She has a normal mood and affect. Her behavior is normal. Judgment and thought content normal.  Vitals reviewed.   Results for orders placed or performed during the hospital encounter of 05/28/17  CBC  Result Value Ref Range   WBC 13.1 (H) 4.0 - 10.5 K/uL   RBC 4.69 3.87 - 5.11 MIL/uL   Hemoglobin 14.5 12.0 - 15.0 g/dL   HCT 44.0 36.0 - 46.0 %   MCV 93.8 78.0 - 100.0  fL   MCH 30.9 26.0 - 34.0 pg   MCHC 33.0 30.0 - 36.0 g/dL   RDW 13.7 11.5 - 15.5 %   Platelets 280 150 - 400 K/uL  Comprehensive metabolic panel  Result Value Ref Range   Sodium 135 135 - 145 mmol/L   Potassium 4.7 3.5 - 5.1 mmol/L   Chloride 101 101 - 111 mmol/L   CO2 26 22 - 32 mmol/L   Glucose, Bld 104 (H) 65 - 99 mg/dL   BUN 12 6 - 20 mg/dL   Creatinine, Ser 0.66 0.44 - 1.00 mg/dL   Calcium 8.7 (L) 8.9 - 10.3 mg/dL   Total Protein 5.4 (L) 6.5 - 8.1 g/dL   Albumin 3.1 (L) 3.5 - 5.0 g/dL   AST 28 15 - 41 U/L   ALT 22 14 - 54 U/L   Alkaline Phosphatase 49 38 - 126 U/L   Total Bilirubin 0.3 0.3 - 1.2 mg/dL   GFR calc non Af Amer >60 >60 mL/min   GFR calc Af Amer >60 >60  mL/min   Anion gap 8 5 - 15  I-Stat Beta hCG blood, ED ([facility], [facility], [facility] only)  Result Value Ref Range   I-stat hCG, quantitative <5.0 <5 mIU/mL   Comment 3              Assessment & Plan:   Problem List Items Addressed This Visit      Other   Bipolar I disorder (HCC)   Relevant Orders   Valproic Acid level (Completed)   Hyperlipidemia   Relevant Medications   atorvastatin (LIPITOR) 20 MG tablet    Other Visit Diagnoses    Encounter for smoking cessation counseling    -  Primary   Relevant Medications   varenicline (CHANTIX STARTING MONTH PAK) 0.5 MG X 11 & 1 MG X 42 tablet   High risk medication use       Relevant Orders   Valproic Acid level (Completed)   Irregular menses       Relevant Medications   norethindrone-ethinyl estradiol (JUNEL FE,GILDESS FE,LOESTRIN FE) 1-20 MG-MCG tablet   Other Relevant Orders   FSH/LH (Completed)   Prediabetes       Relevant Orders   Hemoglobin A1c (Completed)   COMPLETE METABOLIC PANEL WITH GFR (Completed)      # Irregular menses: Chronic problem with no prior visits to OBGYN.  Pelvic US revealed prior ovarian cyst. - Recommended to patient to visit OBGYN, but states she is unable to pay copay and will not go. - START loestrin 1-20 mg -mcg tablet once daily x 21 days, then take inactive pills x 7 days. Repeat pack after total 28 days.  Use for minimum 3 months and up to 6 months.    # Prediabetes: Stable at last check.  Medications continue with increased risk of developing diabetes.  Recheck A1c today.  Followup 3-6 months depending on results.  # Smoking Cessation:  Patient desires to stop smoking and would like to use medication to aid with cravings.   - 7 days prior to quit date, START Chantix starting pack.  Take 0.5 mg tablet once daily w/ food for 3 days.  Take 0.5 mg tablet twice daily w/ food for 3 days.  Then, take 1 mg tablet twice daily and continue for total treatment w/ Chantix of 4-12 weeks.  Stop smoking or reduce by half  on quit date. - Mutually set quit date: 08/13/2017 - START date of Chantix: 08/13/2017 -  Reviewed side effects of nausea, vivid dreams, depression, possible SI.  - Followup 4 weeks or sooner if needed for depression/SI changes.  Discussed risk of using with other psychiatric medications and patient verbalizes understanding.  Discussion today >5 minutes (<10 minutes) specifically on counseling on risks of tobacco use, complications, treatment, smoking cessation.    # Hyperlipidemia: Patient requests refill atorvastatin.  Refill provided.  Previously labs were stable.  Followup with labs 3 months.   Meds ordered this encounter  Medications  . norethindrone-ethinyl estradiol (JUNEL FE,GILDESS FE,LOESTRIN FE) 1-20 MG-MCG tablet    Sig: Take 1 tablet by mouth daily.    Dispense:  1 Package    Refill:  11    Order Specific Question:   Supervising Provider    Answer:   Olin Hauser [[id number]]  . varenicline (CHANTIX STARTING MONTH PAK) 0.5 MG X 11 & 1 MG X 42 tablet    Sig: Take one 0.5 mg tab by mouth once daily for 3 days, then take one 0.5 mg tab twice daily for 4 days, then take one 1 mg tab twice daily.    Dispense:  53 tablet    Refill:  0    Order Specific Question:   Supervising Provider    Answer:   Olin Hauser [[id number]]  . atorvastatin (LIPITOR) 20 MG tablet    Sig: Take 1 tablet (20 mg total) by mouth daily.    Dispense:  90 tablet    Refill:  0    Follow up plan: Return in about 1 month (around 09/10/2017) for smoking cessation and restless legs.  Cassell Smiles, DNP, AGPCNP-BC Adult Gerontology Primary Care Nurse Practitioner [facility] 08/13/2017, 9:59 AM

## 2017-08-13 NOTE — Patient Instructions (Addendum)
Vanessa Baker,   Thank you for coming in to clinic today.  1. For smoking cessation, we are going to start chantix.   Start varenicline (Chantix) 7 days BEFORE your quit date.    Your quit date: 08/12/2017      START Chantix on: 08/13/17   - Day 1-3: Take one 0.5mg  tablet once daily WITH FOOD - Days 4-7: increase to one 0.5mg  tablet twice per day.  - Days 7- 12 weeks max: Increase to one 1mg  tablet twice per day for up to 12 weeks  To quit smoking:  - Only start this treatment if you are mentally ready to quit. - Start Chantix 1 week before your quit date.  If unable to quit completely in 7 days, then work to reduce by 50% or more by 4 weeks, then another 50% reduction in 4 more weeks, and lastly quit after a final 4 weeks.  - We can continue Chantix for up to 12 weeks total if needed for maintenance. - Common side effects include nausea (take with food), headaches, insomnia, vivid dreams, mood instability, seizure, and a rare risk of suicidal ideation.  If you have any serious agitation or acute depression with severe mood changes you need to stop taking the medicine immediately    2. At the start of bleeding for your period next period or by 7/28, start your birth control pills.  May start the Sunday after bleeding if you want to do Sunday start day.  3. BP - no concern in clinic today.  4. Restless legs and arm - Discuss with Dr. Shea Evans. This could be side effects of medication.  Otherwise, we will wait to start any other new meds until 4 weeks from today when we followup for Chantix/smoking cessation.   Please schedule a follow-up appointment with Vanessa Baker, AGNP. Return in about 1 month (around 09/10/2017) for smoking cessation and restless legs.  If you have any other questions or concerns, please feel free to call the clinic or send a message through [facility]. You may also schedule an earlier appointment if necessary.  You will receive a survey after today's visit either  digitally by e-mail or paper by [employer]. Your experiences and feedback matter to [place].  Please respond so we know how we are doing as we provide care for you.   Vanessa Smiles, DNP, AGNP-BC Adult Gerontology Nurse Practitioner [place]

## 2017-08-14 ENCOUNTER — Encounter: Payer: Self-pay | Admitting: Nurse Practitioner

## 2017-08-14 LAB — COMPLETE METABOLIC PANEL WITH GFR
AG Ratio: 1.8 (calc) (ref 1.0–2.5)
ALT: 29 U/L (ref 6–29)
AST: 21 U/L (ref 10–30)
Albumin: 4.1 g/dL (ref 3.6–5.1)
Alkaline phosphatase (APISO): 63 U/L (ref 33–115)
BUN: 14 mg/dL (ref 7–25)
CO2: 25 mmol/L (ref 20–32)
Calcium: 9.3 mg/dL (ref 8.6–10.2)
Chloride: 100 mmol/L (ref 98–110)
Creat: 0.58 mg/dL (ref 0.50–1.10)
GFR, Est African American: 131 mL/min/{1.73_m2} (ref >=60)
GFR, Est Non African American: 113 mL/min/{1.73_m2} (ref >=60)
Globulin: 2.3 g/dL (calc) (ref 1.9–3.7)
Glucose, Bld: 125 mg/dL — ABNORMAL HIGH (ref 65–99)
Potassium: 4.5 mmol/L (ref 3.5–5.3)
Sodium: 135 mmol/L (ref 135–146)
Total Bilirubin: 0.3 mg/dL (ref 0.2–1.2)
Total Protein: 6.4 g/dL (ref 6.1–8.1)

## 2017-08-14 LAB — HEMOGLOBIN A1C
Hgb A1c MFr Bld: 6.4 % of total Hgb — ABNORMAL HIGH (ref <5.7)
Mean Plasma Glucose: 137 (calc)
eAG (mmol/L): 7.6 (calc)

## 2017-08-14 LAB — VALPROIC ACID LEVEL: Valproic Acid Lvl: 61.4 mg/L (ref 50.0–100.0)

## 2017-08-14 LAB — FSH/LH
FSH: 6.5 m[IU]/mL
LH: 5.4 m[IU]/mL

## 2017-08-14 MED ORDER — ATORVASTATIN CALCIUM 20 MG PO TABS: 20.0000 mg | ORAL_TABLET | Freq: Every day | ORAL | 0 refills | Status: DC

## 2017-08-19 ENCOUNTER — Ambulatory Visit: Payer: Medicare Other | Admitting: Psychiatry

## 2017-08-21 ENCOUNTER — Other Ambulatory Visit: Payer: Self-pay | Admitting: Psychiatry

## 2017-08-21 DIAGNOSIS — F4310 Post-traumatic stress disorder, unspecified: Secondary | ICD-10-CM

## 2017-08-21 DIAGNOSIS — F3130 Bipolar disorder, current episode depressed, mild or moderate severity, unspecified: Secondary | ICD-10-CM

## 2017-08-27 ENCOUNTER — Ambulatory Visit: Payer: Self-pay

## 2017-09-06 ENCOUNTER — Encounter: Payer: Self-pay | Admitting: Nurse Practitioner

## 2017-09-06 ENCOUNTER — Other Ambulatory Visit: Payer: Self-pay

## 2017-09-06 ENCOUNTER — Ambulatory Visit (INDEPENDENT_AMBULATORY_CARE_PROVIDER_SITE_OTHER): Payer: Medicare Other | Admitting: Nurse Practitioner

## 2017-09-06 VITALS — BP 129/57 | HR 92 | Temp 98.7°F | Resp 18 | Ht 62.0 in | Wt 192.4 lb

## 2017-09-06 DIAGNOSIS — R42 Dizziness and giddiness: Secondary | ICD-10-CM | POA: Diagnosis not present

## 2017-09-06 DIAGNOSIS — R531 Weakness: Secondary | ICD-10-CM

## 2017-09-06 DIAGNOSIS — N926 Irregular menstruation, unspecified: Secondary | ICD-10-CM | POA: Diagnosis not present

## 2017-09-06 DIAGNOSIS — R61 Generalized hyperhidrosis: Secondary | ICD-10-CM | POA: Diagnosis not present

## 2017-09-06 LAB — POCT URINALYSIS DIPSTICK
Bilirubin, UA: NEGATIVE
Blood, UA: NEGATIVE
Glucose, UA: NEGATIVE
Leukocytes, UA: NEGATIVE
Nitrite, UA: NEGATIVE
Protein, UA: NEGATIVE
Spec Grav, UA: <=1.005 — AB (ref 1.010–1.025)
Urobilinogen, UA: 0.2 E.U./dL

## 2017-09-06 LAB — POCT URINE PREGNANCY: Preg Test, Ur: NEGATIVE

## 2017-09-06 LAB — GLUCOSE, POCT (MANUAL RESULT ENTRY): POC Glucose: 192 mg/dl — AB (ref 70–99)

## 2017-09-06 NOTE — Patient Instructions (Signed)
Vanessa Baker,   Thank you for coming in to clinic today.  1. GO to ER today for more urgent workup.  We cannot run labs and these should be done ASAP.  Please schedule a follow-up appointment with Cassell Smiles, AGNP. Return in about 3 months (around 12/07/2017) for irregular menses AND to EMS for further evaluation of ketones in urine and dizziness/weakness.  If you have any other questions or concerns, please feel free to call the clinic or send a message through [employer]. You may also schedule an earlier appointment if necessary.  You will receive a survey after today's visit either digitally by e-mail or paper by [employer]. Your experiences and feedback matter to [place].  Please respond so we know how we are doing as we provide care for you.   Cassell Smiles, DNP, AGNP-BC Adult Gerontology Nurse Practitioner [place]

## 2017-09-06 NOTE — Progress Notes (Signed)
Subjective:    Patient ID: Vanessa Baker, female    DOB: Aug 26, 1975, 42 y.o.   MRN: [id number]  Vanessa Baker is a 42 y.o. female presenting on 09/06/2017 for Dizziness (frequent intermittent chills, fatigue and hot flashes x 2-3 weeks. The pt states when the symptoms come on she feel like she is about to pass out. Pt think it could be related with her blood sugar.) and Menstrual Problem (irregular cycles, amenorrhea x 6 weeks requesting pregnancy test)   HPI Dizziness First episode 2 weeks ago.  Has happened also during plasma donation.  Has cold sweats, itching, sensation of passing out with lightheadedness, becoming very hot, very thirsty, is drinking lots of water, tea, coffee.  Has sugar cravings and eats spoonfuls of sugar.  States eating improves her symptoms.  Occasionally has some anxiety 2/2 feeling near syncope. - Patient was not cleared by psychiatry to donate plasma.  Is donating twice per week.  Last donated on Thursday 09/05/2017.    Irregular Menses Started first pack when she picked it up.  It was during middle of cycle.  Has no funds to visit GYN as discussed in past.  Irregular menses is not a new concern, but patient does want pregnancy test today. Has not had period in 6 weeks.  Due to have period by OCP next week.   Social History   Tobacco Use  . Smoking status: Current Every Day Smoker    Packs/day: 0.50    Years: 27.00    Pack years: 13.50    Types: Cigarettes  . Smokeless tobacco: Never Used  Substance Use Topics  . Alcohol use: Yes    Alcohol/week: 4.8 - 5.4 oz    Types: 3 Glasses of wine, 5 - 6 Cans of beer per week    Comment: monthly  . Drug use: No    Review of Systems Per HPI unless specifically indicated above     Objective:    BP (!) 129/57 (BP Location: Right Arm, Patient Position: Sitting, Cuff Size: Normal)   Pulse 92   Temp 98.7 F (37.1 C) (Oral)   Resp 18   Ht 5\' 2"  (1.575 m)   Wt 192 lb 6.4 oz (87.3 kg)   SpO2 98%    BMI 35.19 kg/m   Wt Readings from Last 3 Encounters:  09/06/17 192 lb 6.4 oz (87.3 kg)  08/13/17 189 lb 12.8 oz (86.1 kg)  07/02/17 188 lb (85.3 kg)    Physical Exam  Constitutional: She is oriented to person, place, and time. She appears well-developed and well-nourished. No distress.  HENT:  Head: Normocephalic and atraumatic.  Cardiovascular: Normal rate, regular rhythm, S1 normal, S2 normal, normal heart sounds and intact distal pulses.  Pulmonary/Chest: Effort normal and breath sounds normal. No respiratory distress.  Abdominal: Bowel sounds are normal. There is no hepatosplenomegaly. There is generalized tenderness. There is rebound (mild rebound at epigastric region). There is no rigidity, no guarding, no CVA tenderness, no tenderness at McBurney's point and negative Murphy's sign. Hernia confirmed negative in the ventral area.  Neurological: She is alert and oriented to person, place, and time. GCS eye subscore is 4. GCS verbal subscore is 5. GCS motor subscore is 6.  Skin: Skin is warm and dry.  Psychiatric: She has a normal mood and affect. Her behavior is normal.  Vitals reviewed.   Results for orders placed or performed in visit on 09/06/17  POCT urinalysis dipstick  Result Value Ref Range  Color, UA yellow    Clarity, UA clear    Glucose, UA Negative Negative   Bilirubin, UA Negative    Ketones, UA Moderate    Spec Grav, UA <=1.005 (A) 1.010 - 1.025   Blood, UA negative    pH, UA  5.0 - 8.0   Protein, UA Negative Negative   Urobilinogen, UA 0.2 0.2 or 1.0 E.U./dL   Nitrite, UA negative    Leukocytes, UA Negative Negative   Appearance     Odor    POCT urine pregnancy  Result Value Ref Range   Preg Test, Ur Negative Negative  POCT glucose (manual entry)  Result Value Ref Range   POC Glucose 192 (A) 70 - 99 mg/dl      Assessment & Plan:   Problem List Items Addressed This Visit    None    Visit Diagnoses    Dizziness    -  Primary   Relevant Orders    CBC with Differential/Platelet   POCT urinalysis dipstick (Completed)   COMPLETE METABOLIC PANEL WITH GFR   POCT glucose (manual entry) (Completed)   Diaphoresis       Relevant Orders   CBC with Differential/Platelet   POCT urinalysis dipstick (Completed)   COMPLETE METABOLIC PANEL WITH GFR   POCT glucose (manual entry) (Completed)   Weakness       Relevant Orders   CBC with Differential/Platelet   POCT urinalysis dipstick (Completed)   COMPLETE METABOLIC PANEL WITH GFR   POCT glucose (manual entry) (Completed)   Irregular menses       Relevant Orders   POCT urine pregnancy (Completed)      Suspect ketoacidosis with symptoms of chills, weakness, hot flashes, polydipsia, polyphagia (sugar cravings) and moderate ketones in urine.  Cause undetermined, but could include HHNK.  Approximately 2 hours after meal glucose elevated to 192 today, which is not highly suggestive of HHNK.  Last A1c = 6.4 on 08/13/2017.  08/13/2017 Normal kidney function, liver function, and electrolytes.  Concern for change in medication concentrations for Bipolar disorder with 2x weekly plasma donation without approval for donation. - Patient needs STAT labs and possible abdominal imaging with epigastric rebound tenderness noted.  No current acute abdomen.   - Patient to travel to ED by private vehicle for more urgent workup of symptoms. - Followup as needed.  Irregular menses Continue to recommend GYN if ongoing difficulty with menses.  Possible amenorrhea for first cycle on OCP as patient did not start pill pack until mid-cycle. - Negative Pregnancy test. - Continue OCP.  If no menses next week with inactive pills, will require GYN visit.  Follow up plan: Return in about 3 months (around 12/07/2017) for irregular menses AND to EMS for further evaluation of ketones in urine and dizziness/weakness.  Cassell Smiles, DNP, AGPCNP-BC Adult Gerontology Primary Care Nurse Practitioner [facility] 09/06/2017, 2:36 PM

## 2017-09-12 ENCOUNTER — Ambulatory Visit: Payer: Medicare Other | Admitting: Nurse Practitioner

## 2017-09-12 ENCOUNTER — Other Ambulatory Visit: Payer: Self-pay

## 2017-09-12 ENCOUNTER — Ambulatory Visit (INDEPENDENT_AMBULATORY_CARE_PROVIDER_SITE_OTHER): Payer: Medicare Other | Admitting: Nurse Practitioner

## 2017-09-12 ENCOUNTER — Encounter: Payer: Self-pay | Admitting: Nurse Practitioner

## 2017-09-12 VITALS — BP 131/74 | HR 109 | Temp 98.4°F | Ht 62.0 in | Wt 192.2 lb

## 2017-09-12 DIAGNOSIS — R739 Hyperglycemia, unspecified: Secondary | ICD-10-CM

## 2017-09-12 MED ORDER — INSULIN LISPRO 100 UNIT/ML ~~LOC~~ SOLN: SUBCUTANEOUS | 11 refills | Status: DC

## 2017-09-12 MED ORDER — INSULIN DETEMIR 100 UNIT/ML ~~LOC~~ SOLN: 5.0000 [IU] | Freq: Every day | SUBCUTANEOUS | 11 refills | Status: DC

## 2017-09-12 MED ORDER — BLOOD GLUCOSE METER KIT: PACK | 0 refills | Status: DC

## 2017-09-12 NOTE — Patient Instructions (Addendum)
Vanessa Baker,   Thank you for coming in to clinic today.  1. Give yourself 5 units Levemir at bedtime every day into your stomach.   2. Correction Scale for before meal blood sugars: 150-200: give 1 unit Humalog 201-250: give 2 units Humalog 251-300: give 3 units Humalog 301-350: give 4 units Humalog 351-400 : give 5 units Humalog > 400: give 6 units and call clinic or go to ER if you are also having worsening symptoms   Please schedule a follow-up appointment with Cassell Smiles, AGNP. Return in about 3 weeks (around 10/03/2017) for hyperglycemia, insulin start.  If you have any other questions or concerns, please feel free to call the clinic or send a message through [place]. You may also schedule an earlier appointment if necessary.  You will receive a survey after today's visit either digitally by e-mail or paper by [employer]. Your experiences and feedback matter to [place].  Please respond so we know how we are doing as we provide care for you.   Cassell Smiles, DNP, AGNP-BC Adult Gerontology Nurse Practitioner [facility]

## 2017-09-12 NOTE — Progress Notes (Signed)
Subjective:    Patient ID: Vanessa Baker, female    DOB: 1975-12-27, 42 y.o.   MRN: [id number]  Vanessa Baker is a 42 y.o. female presenting on 09/12/2017 for Dizziness (lightheadedness, weak, dry-mouth, polydipsia and sweaty x 2-3 weeks. )   HPI Dizziness Patient with visit to [facility] ED 09/08/2017 and confirmed no active HHNK.  No insulin started and referred back to PCP.   She is having persistent symptoms of hyperglycemia. - Feels worse after eating.  Ate chicken salad today for lunch.  Is avoiding sugars now.  Drinking lots of water.  Is urinating significantly. - Yesterday used someone else's glucose meter - 252  Social History   Tobacco Use  . Smoking status: Current Every Day Smoker    Packs/day: 0.50    Years: 27.00    Pack years: 13.50    Types: Cigarettes  . Smokeless tobacco: Never Used  Substance Use Topics  . Alcohol use: Yes    Alcohol/week: 8.0 - 9.0 standard drinks    Types: 3 Glasses of wine, 5 - 6 Cans of beer per week    Comment: monthly  . Drug use: No    Review of Systems Per HPI unless specifically indicated above     Objective:    BP 131/74 (BP Location: Left Arm, Patient Position: Sitting, Cuff Size: Normal)   Pulse (!) 109   Temp 98.4 F (36.9 C) (Oral)   Ht _0  (1.575 m)   Wt 192 lb 3.2 oz (87.2 kg)   BMI 35.15 kg/m   Wt Readings from Last 3 Encounters:  09/12/17 192 lb 3.2 oz (87.2 kg)  09/06/17 192 lb 6.4 oz (87.3 kg)  08/13/17 189 lb 12.8 oz (86.1 kg)    Physical Exam  Constitutional: She is oriented to person, place, and time. She appears well-developed and well-nourished. She appears ill.  HENT:  Head: Normocephalic and atraumatic.  Neurological: She is alert and oriented to person, place, and time.  Skin: Skin is warm and dry. Capillary refill takes less than 2 seconds.  Psychiatric: She has a normal mood and affect. Her behavior is normal.  Vitals reviewed.   Results for orders placed or performed in visit on  09/06/17  POCT urinalysis dipstick  Result Value Ref Range   Color, UA yellow    Clarity, UA clear    Glucose, UA Negative Negative   Bilirubin, UA Negative    Ketones, UA Moderate    Spec Grav, UA <=1.005 (A) 1.010 - 1.025   Blood, UA negative    pH, UA  5.0 - 8.0   Protein, UA Negative Negative   Urobilinogen, UA 0.2 0.2 or 1.0 E.U./dL   Nitrite, UA negative    Leukocytes, UA Negative Negative   Appearance     Odor    POCT urine pregnancy  Result Value Ref Range   Preg Test, Ur Negative Negative  POCT glucose (manual entry)  Result Value Ref Range   POC Glucose 192 (A) 70 - 99 mg/dl      Assessment & Plan:   Problem List Items Addressed This Visit    None    Visit Diagnoses    Hyperglycemia    -  Primary   Relevant Medications   blood glucose meter kit and supplies   Insulin Detemir (LEVEMIR) 100 UNIT/ML Pen   insulin lispro (HUMALOG) 100 UNIT/ML KiwkPen    Acutely worsening hyperglycemia.  Likely diabetes T2.  Patient with several psychiatric meds that  can cause hyperglycemia as well, seeing psychiatry at [facility].  Plan: 1. Educated patient about insulin start - low, high, injection sites, CBG checks - START Levemir 5 units daily at bedtime - START humalog 1 unit per 50 over 150 up to 6 units (see chart in AVS). 2. Continue eating regular meals with use of insulin, do not skip meals.  Focus on consuming adequate protein, vegetables, limit some carbs. 3. Follow-up 3 weeks with sugar log.  Meds ordered this encounter  Medications  . blood glucose meter kit and supplies    Sig: Dispense based on patient and insurance preference. Use up to four times daily as directed. (FOR ICD-10 E10.9, E11.9).    Dispense:  1 each    Refill:  0    Order Specific Question:   Supervising Provider    Answer:   Olin Hauser [[id number]]    Order Specific Question:   Number of strips    Answer:   100    Order Specific Question:   Number of lancets    Answer:   100  . DISCONTD:  insulin lispro (HUMALOG) 100 UNIT/ML injection    Sig: Administer at meals 1 unit for every 50 over 150 up to 8 units (see chart).    Dispense:  10 mL    Refill:  11    Order Specific Question:   Supervising Provider    Answer:   Olin Hauser [[id number]]  . DISCONTD: insulin detemir (LEVEMIR) 100 UNIT/ML injection    Sig: Inject 0.05 mLs (5 Units total) into the skin daily.    Dispense:  10 mL    Refill:  11    Order Specific Question:   Supervising Provider    Answer:   Olin Hauser [[id number]]  . Insulin Detemir (LEVEMIR) 100 UNIT/ML Pen    Sig: Inject 5 Units into the skin at bedtime.    Dispense:  15 mL    Refill:  11    Order Specific Question:   Supervising Provider    Answer:   Olin Hauser [[id number]]  . insulin lispro (HUMALOG) 100 UNIT/ML KiwkPen    Sig: Administer at meals 1 unit per 50 over CBG of 150 up to 6 units (see chart).    Dispense:  15 mL    Refill:  11    Order Specific Question:   Supervising Provider    Answer:   Olin Hauser [[id number]]    Follow up plan: Return in about 3 weeks (around 10/03/2017) for hyperglycemia, insulin start.  Cassell Smiles, DNP, AGPCNP-BC Adult Gerontology Primary Care Nurse Practitioner [facility] 09/12/2017, 4:14 PM

## 2017-09-13 ENCOUNTER — Encounter: Payer: Self-pay | Admitting: Nurse Practitioner

## 2017-09-13 DIAGNOSIS — R739 Hyperglycemia, unspecified: Secondary | ICD-10-CM

## 2017-09-13 MED ORDER — INSULIN PEN NEEDLE 32G X 5 MM MISC: 1.0000 | Freq: Four times a day (QID) | 11 refills | Status: DC

## 2017-09-13 MED ORDER — INSULIN LISPRO 100 UNIT/ML (KWIKPEN): PEN_INJECTOR | SUBCUTANEOUS | 11 refills | Status: DC

## 2017-09-13 MED ORDER — INSULIN DETEMIR 100 UNIT/ML FLEXPEN: 5.0000 [IU] | PEN_INJECTOR | Freq: Every day | SUBCUTANEOUS | 11 refills | Status: DC

## 2017-09-17 ENCOUNTER — Ambulatory Visit: Payer: Medicare Other | Admitting: Nurse Practitioner

## 2017-09-19 ENCOUNTER — Other Ambulatory Visit: Payer: Self-pay | Admitting: Psychiatry

## 2017-09-19 DIAGNOSIS — F4310 Post-traumatic stress disorder, unspecified: Secondary | ICD-10-CM

## 2017-09-19 DIAGNOSIS — F3130 Bipolar disorder, current episode depressed, mild or moderate severity, unspecified: Secondary | ICD-10-CM

## 2017-09-27 ENCOUNTER — Other Ambulatory Visit: Payer: Self-pay | Admitting: Psychiatry

## 2017-09-27 DIAGNOSIS — F3130 Bipolar disorder, current episode depressed, mild or moderate severity, unspecified: Secondary | ICD-10-CM

## 2017-09-27 DIAGNOSIS — F4310 Post-traumatic stress disorder, unspecified: Secondary | ICD-10-CM

## 2017-09-28 ENCOUNTER — Other Ambulatory Visit: Payer: Self-pay | Admitting: Nurse Practitioner

## 2017-09-30 ENCOUNTER — Ambulatory Visit: Payer: Medicare Other | Admitting: Nurse Practitioner

## 2017-09-30 ENCOUNTER — Ambulatory Visit: Payer: Medicare Other | Admitting: Psychiatry

## 2017-10-01 ENCOUNTER — Encounter: Payer: Self-pay | Admitting: Nurse Practitioner

## 2017-10-01 ENCOUNTER — Ambulatory Visit (INDEPENDENT_AMBULATORY_CARE_PROVIDER_SITE_OTHER): Payer: Medicare Other | Admitting: Nurse Practitioner

## 2017-10-01 ENCOUNTER — Other Ambulatory Visit: Payer: Self-pay

## 2017-10-01 VITALS — BP 116/52 | HR 75 | Temp 98.0°F | Ht 62.0 in | Wt 191.2 lb

## 2017-10-01 DIAGNOSIS — E1165 Type 2 diabetes mellitus with hyperglycemia: Secondary | ICD-10-CM

## 2017-10-01 DIAGNOSIS — Z794 Long term (current) use of insulin: Principal | ICD-10-CM

## 2017-10-01 DIAGNOSIS — N3001 Acute cystitis with hematuria: Secondary | ICD-10-CM | POA: Diagnosis not present

## 2017-10-01 DIAGNOSIS — R300 Dysuria: Secondary | ICD-10-CM | POA: Diagnosis not present

## 2017-10-01 LAB — POCT URINALYSIS DIPSTICK
Bilirubin, UA: NEGATIVE
Glucose, UA: NEGATIVE
Ketones, UA: NEGATIVE
Nitrite, UA: NEGATIVE
Protein, UA: NEGATIVE
Spec Grav, UA: 1.025 (ref 1.010–1.025)
Urobilinogen, UA: 0.2 E.U./dL
pH, UA: 5 (ref 5.0–8.0)

## 2017-10-01 LAB — CBC WITH DIFFERENTIAL/PLATELET
Basophils Absolute: 190 cells/uL (ref 0–200)
Basophils Relative: 1.6 %
Eosinophils Absolute: 274 cells/uL (ref 15–500)
Eosinophils Relative: 2.3 %
HCT: 45.6 % — ABNORMAL HIGH (ref 35.0–45.0)
Hemoglobin: 15.2 g/dL (ref 11.7–15.5)
Lymphs Abs: 3082 cells/uL (ref 850–3900)
MCH: 31.1 pg (ref 27.0–33.0)
MCHC: 33.3 g/dL (ref 32.0–36.0)
MCV: 93.3 fL (ref 80.0–100.0)
MPV: 10.7 fL (ref 7.5–12.5)
Monocytes Relative: 6.2 %
Neutro Abs: 7616 cells/uL (ref 1500–7800)
Neutrophils Relative %: 64 %
Platelets: 297 10*3/uL (ref 140–400)
RBC: 4.89 10*6/uL (ref 3.80–5.10)
RDW: 12.8 % (ref 11.0–15.0)
Total Lymphocyte: 25.9 %
WBC mixed population: 738 cells/uL (ref 200–950)
WBC: 11.9 10*3/uL — ABNORMAL HIGH (ref 3.8–10.8)

## 2017-10-01 LAB — COMPLETE METABOLIC PANEL WITH GFR
AG Ratio: 1.6 (calc) (ref 1.0–2.5)
ALT: 10 U/L (ref 6–29)
AST: 18 U/L (ref 10–30)
Albumin: 3.6 g/dL (ref 3.6–5.1)
Alkaline phosphatase (APISO): 39 U/L (ref 33–115)
BUN: 16 mg/dL (ref 7–25)
CO2: 23 mmol/L (ref 20–32)
Calcium: 9.1 mg/dL (ref 8.6–10.2)
Chloride: 103 mmol/L (ref 98–110)
Creat: 0.67 mg/dL (ref 0.50–1.10)
GFR, Est African American: 125 mL/min/{1.73_m2} (ref >=60)
GFR, Est Non African American: 108 mL/min/{1.73_m2} (ref >=60)
Globulin: 2.2 g/dL (calc) (ref 1.9–3.7)
Glucose, Bld: 138 mg/dL — ABNORMAL HIGH (ref 65–99)
Potassium: 4.6 mmol/L (ref 3.5–5.3)
Sodium: 135 mmol/L (ref 135–146)
Total Bilirubin: 0.2 mg/dL (ref 0.2–1.2)
Total Protein: 5.8 g/dL — ABNORMAL LOW (ref 6.1–8.1)

## 2017-10-01 MED ORDER — METFORMIN HCL 500 MG PO TABS: 500.0000 mg | ORAL_TABLET | Freq: Two times a day (BID) | ORAL | 3 refills | Status: DC

## 2017-10-01 MED ORDER — GLUCOSE BLOOD VI STRP: ORAL_STRIP | 3 refills | Status: DC

## 2017-10-01 MED ORDER — CEPHALEXIN 500 MG PO CAPS: 500.0000 mg | ORAL_CAPSULE | Freq: Three times a day (TID) | ORAL | 0 refills | Status: DC

## 2017-10-01 MED ORDER — ACCU-CHEK SOFTCLIX LANCETS MISC: 3 refills | Status: DC

## 2017-10-01 NOTE — Patient Instructions (Addendum)
Vanessa Baker,   Thank you for coming in to clinic today.  1. Give yourself 8 units Levemir at bedtime every day into your stomach.   2. Only check blood sugar before meals.  Only give yourself Humalog insulin with a meal. Correction Scale for before meal blood sugars: 150-200: give 1 unit Humalog 201-250: give 2 units Humalog 251-300: give 3 units Humalog 301-350: give 4 units Humalog 351-400 : give 5 units Humalog > 400: give 6 units and call clinic or go to ER if you are also having worsening symptoms  3. Start metformin 500 mg one tablet.  Start with metformin 500 mg one tablet once daily with breakfast x 2 weeks, then increase to 500 mg twice daily with breakfast and dinner.   4. You also have a UTI - Drink lots of fluids - Keep yourself clean and dry.  Wipe front to back after using restroom. - START Keflex 500 mg three times daily (about every 8 hours) for 5 days.   You will be due for [place].  This means you should eat no food or drink after midnight.  Drink only water or coffee without cream/sugar on the morning of your lab visit. - Please go ahead and schedule a "Lab Only" visit in the morning at the clinic for lab draw on the same day as your next office visit if in the morning. - Your results will be available about 2-3 days after blood draw.  If you have set up a MyChart account, you can can log in to MyChart online to view your results and a brief explanation. Also, we can discuss your results together at your next office visit if you would like.  Please schedule a follow-up appointment with Cassell Smiles, AGNP. Return in about 6 weeks (around 11/13/2017) for diabetes.  If you have any other questions or concerns, please feel free to call the clinic or send a message through [place]. You may also schedule an earlier appointment if necessary.  You will receive a survey after today's visit either digitally by e-mail or paper by [employer]. Your  experiences and feedback matter to [place].  Please respond so we know how we are doing as we provide care for you.   Cassell Smiles, DNP, AGNP-BC Adult Gerontology Nurse Practitioner [facility], [facility]   Urinary Tract Infection, Adult A urinary tract infection (UTI) is an infection of any part of the urinary tract. The urinary tract includes the:  Kidneys.  Ureters.  Bladder.  Urethra.  These organs make, store, and get rid of pee (urine) in the body. Follow these instructions at home:  Take over-the-counter and prescription medicines only as told by your doctor.  If you were prescribed an antibiotic medicine, take it as told by your doctor. Do not stop taking the antibiotic even if you start to feel better.  Avoid the following drinks: ? Alcohol. ? Caffeine. ? Tea. ? Carbonated drinks.  Drink enough fluid to keep your pee clear or pale yellow.  Keep all follow-up visits as told by your doctor. This is important.  Make sure to: ? Empty your bladder often and completely. Do not to hold pee for long periods of time. ? Empty your bladder before and after sex. ? Wipe from front to back after a bowel movement if you are female. Use each tissue one time when you wipe. Contact a doctor if:  You have back pain.  You have a fever.  You feel sick to your stomach (nauseous).  You throw up (vomit).  Your symptoms do not get better after 3 days.  Your symptoms go away and then come back. Get help right away if:  You have very bad back pain.  You have very bad lower belly (abdominal) pain.  You are throwing up and cannot keep down any medicines or water. This information is not intended to replace advice given to you by your health care provider. Make sure you discuss any questions you have with your health care provider. Document Released: 07/11/2007 Document Revised: 06/30/2015 Document Reviewed: 12/13/2014 Elsevier Interactive Patient Education  [employer].

## 2017-10-01 NOTE — Progress Notes (Signed)
Subjective:    Patient ID: Vanessa Baker, female    DOB: Aug 18, 1975, 42 y.o.   MRN: [id number]  Vanessa Baker is a 42 y.o. female presenting on 10/01/2017 for Urinary Tract Infection (dysuria, urinary frequency and urinary hesitancy x 5 days)   HPI UTI Patient has increased urinary symptoms with dysuria, hesitancy, and frequency 5 days ago.  Also feels pressure in bladder.  Symptoms have been stable. Azo has helped with symptoms for 2 days, but is back to prior symptoms.   - Denies back pain, flank pain. Patient continues to have abdominal pain, but no new pain changes with dysuria. - Pt denies fever, chills, sweats, nausea, vomiting, diarrhea and constipation.  Diabetes Patient has had Levemir and Humalog insulins for 3-4 weeks and is improving slowly.  Polydipsia is reducing in frequency and other abdominal complaints are slowly improving.  She is checking CBG at home with a range of fasting am: 103-282, fasting lunch: 104-244, fasting dinner: 114-231; post-prandial CBG always 60-100 mg/dL higher than preprandial when checked.  She is checking more often than requested, which was three times daily. - Current diabetic medications include: Levemir and Humalog - She is not currently symptomatic.  - She denies polydipsia, polyphagia, polyuria, headaches, diaphoresis, shakiness, chills, pain, numbness or tingling in extremities and changes in vision.   - Clinical course has been improving. - She  reports no regular exercise routine. - Her diet is moderate in salt, moderate in fat, and moderate in carbohydrates. - Weight trend: stable  Recent Labs    04/22/17 1135 08/13/17 1033  HGBA1C 6.2* 6.4*    Social History   Tobacco Use  . Smoking status: Current Every Day Smoker    Packs/day: 0.50    Years: 27.00    Pack years: 13.50    Types: Cigarettes  . Smokeless tobacco: Never Used  Substance Use Topics  . Alcohol use: Yes    Alcohol/week: 8.0 - 9.0 standard  drinks    Types: 3 Glasses of wine, 5 - 6 Cans of beer per week    Comment: monthly  . Drug use: No    Review of Systems Per HPI unless specifically indicated above     Objective:    BP (!) 116/52 (BP Location: Left Arm, Patient Position: Sitting, Cuff Size: Normal)   Pulse 75   Temp 98 F (36.7 C) (Oral)   Ht 5\' 2"  (1.575 m)   Wt 191 lb 3.2 oz (86.7 kg)   BMI 34.97 kg/m   Wt Readings from Last 3 Encounters:  10/01/17 191 lb 3.2 oz (86.7 kg)  09/12/17 192 lb 3.2 oz (87.2 kg)  09/06/17 192 lb 6.4 oz (87.3 kg)    Physical Exam  Constitutional: She is oriented to person, place, and time. She appears well-developed and well-nourished. No distress.  HENT:  Head: Normocephalic and atraumatic.  Cardiovascular: Normal rate, regular rhythm, S1 normal, S2 normal, normal heart sounds and intact distal pulses.  Pulmonary/Chest: Effort normal and breath sounds normal. No respiratory distress.  Abdominal: Soft. Bowel sounds are normal. She exhibits no distension. There is no hepatosplenomegaly. There is tenderness in the suprapubic area. There is no CVA tenderness. No hernia.  Neurological: She is alert and oriented to person, place, and time.  Skin: Skin is warm and dry. Capillary refill takes less than 2 seconds.  Psychiatric: She has a normal mood and affect. Her behavior is normal. Judgment and thought content normal.  Vitals reviewed.  Results for orders  placed or performed in visit on 10/01/17  POCT Urinalysis Dipstick  Result Value Ref Range   Color, UA yellow    Clarity, UA clear    Glucose, UA Negative Negative   Bilirubin, UA negative    Ketones, UA negative    Spec Grav, UA 1.025 1.010 - 1.025   Blood, UA large    pH, UA 5.0 5.0 - 8.0   Protein, UA Negative Negative   Urobilinogen, UA 0.2 0.2 or 1.0 E.U./dL   Nitrite, UA negative    Leukocytes, UA Large (3+) (A) Negative   Appearance     Odor        Assessment & Plan:   Problem List Items Addressed This Visit      None    Visit Diagnoses    Dysuria    -  Primary   Relevant Medications   cephALEXin (KEFLEX) 500 MG capsule   Other Relevant Orders   POCT Urinalysis Dipstick (Completed)   Acute cystitis with hematuria       Relevant Medications   cephALEXin (KEFLEX) 500 MG capsule   Uncontrolled type 2 diabetes mellitus with hyperglycemia (HCC)       Relevant Medications   metFORMIN (GLUCOPHAGE) 500 MG tablet      # Acute cystitis with hematuria.  Pt symptomatic currently with increased suprapubic pressure x 5 days. Currently without systemic signs or symptoms of infection.   - No current risk of concurrent STI.  Plan: 1. START Keflex 500mg  3 times daily for next 5 days.   2. Provided non-pharm measures for UTI prevention for good hygiene. 3. Drink plenty of fluids and improve hydration over next 1 week. 4. Provided precautions for severe symptoms requiring ED visit to include no urine in 24-48 hours. 5. Followup 2-5 days as needed for worsening or persistent symptoms.     # DM Uncontrolled DM, but with improved fasting CBG after starting insulins 3 weeks ago.  - Complications - hyperglycemia.   - No ketones in urine today, resolved proteinuria  Plan:  1. Change therapy: Continue Humalog correction scale,  Increase levemir to 8 units once daily at bedtime.  - Start metformin 500 mg bid.  Start w/ 500 mg once daily with breakfast x 2 weeks, then increase to 500 mg twice daily with breakfast and dinner.  Discussed common side effects to include diarrhea, gas, liver enzyme elevation.  2. Check mealtime CBG before breakfast, lunch, and dinner only.  Bring log to next visit for review 3. Follow-up 11/13/2017 for repeat A1c and adjustment of medications.  Desire to reduce and eliminate Humalog if possible.   Meds ordered this encounter  Medications  . cephALEXin (KEFLEX) 500 MG capsule    Sig: Take 1 capsule (500 mg total) by mouth 3 (three) times daily for 5 days.    Dispense:  15 capsule     Refill:  0    Order Specific Question:   Supervising Provider    Answer:   Olin Hauser [[id number]]  . metFORMIN (GLUCOPHAGE) 500 MG tablet    Sig: Take 1 tablet (500 mg total) by mouth 2 (two) times daily with a meal.    Dispense:  180 tablet    Refill:  3    Order Specific Question:   Supervising Provider    Answer:   Olin Hauser [[id number]]    Follow up plan: Return in about 6 weeks (around 11/13/2017) for diabetes.  Cassell Smiles, DNP, AGPCNP-BC Adult Gerontology Primary Care  Nurse Practitioner [facility] 10/01/2017, 9:44 AM

## 2017-10-03 ENCOUNTER — Ambulatory Visit: Payer: Self-pay | Admitting: Nurse Practitioner

## 2017-10-04 ENCOUNTER — Other Ambulatory Visit: Payer: Self-pay | Admitting: Psychiatry

## 2017-10-04 DIAGNOSIS — F4310 Post-traumatic stress disorder, unspecified: Secondary | ICD-10-CM

## 2017-10-10 ENCOUNTER — Telehealth: Payer: Self-pay

## 2017-10-10 DIAGNOSIS — F3130 Bipolar disorder, current episode depressed, mild or moderate severity, unspecified: Secondary | ICD-10-CM

## 2017-10-10 DIAGNOSIS — F4310 Post-traumatic stress disorder, unspecified: Secondary | ICD-10-CM

## 2017-10-10 DIAGNOSIS — G2119 Other drug induced secondary parkinsonism: Secondary | ICD-10-CM

## 2017-10-10 MED ORDER — TRAZODONE HCL 50 MG PO TABS: 25.0000 mg | ORAL_TABLET | Freq: Every evening | ORAL | 0 refills | Status: DC | PRN

## 2017-10-10 MED ORDER — SERTRALINE HCL 100 MG PO TABS: 100.0000 mg | ORAL_TABLET | Freq: Every day | ORAL | 0 refills | Status: DC

## 2017-10-10 MED ORDER — DIVALPROEX SODIUM 500 MG PO DR TAB: DELAYED_RELEASE_TABLET | ORAL | 0 refills | Status: DC

## 2017-10-10 MED ORDER — TRIHEXYPHENIDYL HCL 5 MG PO TABS: 5.0000 mg | ORAL_TABLET | Freq: Three times a day (TID) | ORAL | 0 refills | Status: DC

## 2017-10-10 MED ORDER — OLANZAPINE 10 MG PO TABS: 10.0000 mg | ORAL_TABLET | Freq: Every day | ORAL | 0 refills | Status: DC

## 2017-10-10 NOTE — Telephone Encounter (Signed)
pt called left message that she needed medication refills.

## 2017-10-10 NOTE — Telephone Encounter (Signed)
pt has called left message that she needs refills on all her medications.

## 2017-10-10 NOTE — Telephone Encounter (Signed)
pt called left another message that she needs her mediations refilled.

## 2017-10-10 NOTE — Telephone Encounter (Signed)
All medications sent to her pharmacy

## 2017-10-10 NOTE — Telephone Encounter (Signed)
pharmacy was called and they stated that the last rx they received on pt was in may.

## 2017-10-15 NOTE — Telephone Encounter (Signed)
Pt has appt for 10-17-17

## 2017-10-17 ENCOUNTER — Encounter: Payer: Self-pay | Admitting: Psychiatry

## 2017-10-17 ENCOUNTER — Encounter: Payer: Self-pay | Admitting: Nurse Practitioner

## 2017-10-17 ENCOUNTER — Other Ambulatory Visit: Payer: Self-pay

## 2017-10-17 ENCOUNTER — Ambulatory Visit (INDEPENDENT_AMBULATORY_CARE_PROVIDER_SITE_OTHER): Payer: Medicare Other | Admitting: Psychiatry

## 2017-10-17 VITALS — BP 128/86 | Ht 62.0 in | Wt 189.0 lb

## 2017-10-17 DIAGNOSIS — F4310 Post-traumatic stress disorder, unspecified: Secondary | ICD-10-CM

## 2017-10-17 DIAGNOSIS — F3130 Bipolar disorder, current episode depressed, mild or moderate severity, unspecified: Secondary | ICD-10-CM | POA: Diagnosis not present

## 2017-10-17 DIAGNOSIS — F1411 Cocaine abuse, in remission: Secondary | ICD-10-CM | POA: Diagnosis not present

## 2017-10-17 DIAGNOSIS — F17200 Nicotine dependence, unspecified, uncomplicated: Secondary | ICD-10-CM

## 2017-10-17 DIAGNOSIS — F410 Panic disorder [episodic paroxysmal anxiety] without agoraphobia: Secondary | ICD-10-CM

## 2017-10-17 DIAGNOSIS — F1010 Alcohol abuse, uncomplicated: Secondary | ICD-10-CM | POA: Diagnosis not present

## 2017-10-17 DIAGNOSIS — F1211 Cannabis abuse, in remission: Secondary | ICD-10-CM

## 2017-10-17 DIAGNOSIS — J4540 Moderate persistent asthma, uncomplicated: Secondary | ICD-10-CM

## 2017-10-17 DIAGNOSIS — G2119 Other drug induced secondary parkinsonism: Secondary | ICD-10-CM

## 2017-10-17 DIAGNOSIS — J45909 Unspecified asthma, uncomplicated: Secondary | ICD-10-CM

## 2017-10-17 DIAGNOSIS — G2581 Restless legs syndrome: Secondary | ICD-10-CM

## 2017-10-17 MED ORDER — BUDESONIDE-FORMOTEROL FUMARATE 80-4.5 MCG/ACT IN AERO: 2.0000 | INHALATION_SPRAY | Freq: Two times a day (BID) | RESPIRATORY_TRACT | 3 refills | Status: DC

## 2017-10-17 MED ORDER — ALBUTEROL SULFATE HFA 108 (90 BASE) MCG/ACT IN AERS: INHALATION_SPRAY | RESPIRATORY_TRACT | 5 refills | Status: DC

## 2017-10-17 MED ORDER — GABAPENTIN 300 MG PO CAPS: 300.0000 mg | ORAL_CAPSULE | Freq: Every day | ORAL | 1 refills | Status: DC
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
## 2017-10-17 NOTE — Patient Instructions (Signed)
Gabapentin capsules or tablets What is this medicine? GABAPENTIN (GA ba pen tin) is used to control partial seizures in adults with epilepsy. It is also used to treat certain types of nerve pain. This medicine may be used for other purposes; ask your health care provider or pharmacist if you have questions. COMMON BRAND NAME(S): Active-PAC with Gabapentin, Gabarone, Neurontin What should I tell my health care provider before I take this medicine? They need to know if you have any of these conditions: -kidney disease -suicidal thoughts, plans, or attempt; a previous suicide attempt by you or a family member -an unusual or allergic reaction to gabapentin, other medicines, foods, dyes, or preservatives -pregnant or trying to get pregnant -breast-feeding How should I use this medicine? Take this medicine by mouth with a glass of water. Follow the directions on the prescription label. You can take it with or without food. If it upsets your stomach, take it with food.Take your medicine at regular intervals. Do not take it more often than directed. Do not stop taking except on your doctor's advice. If you are directed to break the 600 or 800 mg tablets in half as part of your dose, the extra half tablet should be used for the next dose. If you have not used the extra half tablet within 28 days, it should be thrown away. A special MedGuide will be given to you by the pharmacist with each prescription and refill. Be sure to read this information carefully each time. Talk to your pediatrician regarding the use of this medicine in children. Special care may be needed. Overdosage: If you think you have taken too much of this medicine contact a poison control center or emergency room at once. NOTE: This medicine is only for you. Do not share this medicine with others. What if I miss a dose? If you miss a dose, take it as soon as you can. If it is almost time for your next dose, take only that dose. Do not  take double or extra doses. What may interact with this medicine? Do not take this medicine with any of the following medications: -other gabapentin products This medicine may also interact with the following medications: -alcohol -antacids -antihistamines for allergy, cough and cold -certain medicines for anxiety or sleep -certain medicines for depression or psychotic disturbances -homatropine; hydrocodone -naproxen -narcotic medicines (opiates) for pain -phenothiazines like chlorpromazine, mesoridazine, prochlorperazine, thioridazine This list may not describe all possible interactions. Give your health care provider a list of all the medicines, herbs, non-prescription drugs, or dietary supplements you use. Also tell them if you smoke, drink alcohol, or use illegal drugs. Some items may interact with your medicine. What should I watch for while using this medicine? Visit your doctor or health care professional for regular checks on your progress. You may want to keep a record at home of how you feel your condition is responding to treatment. You may want to share this information with your doctor or health care professional at each visit. You should contact your doctor or health care professional if your seizures get worse or if you have any new types of seizures. Do not stop taking this medicine or any of your seizure medicines unless instructed by your doctor or health care professional. Stopping your medicine suddenly can increase your seizures or their severity. Wear a medical identification bracelet or chain if you are taking this medicine for seizures, and carry a card that lists all your medications. You may get drowsy, dizzy,   or have blurred vision. Do not drive, use machinery, or do anything that needs mental alertness until you know how this medicine affects you. To reduce dizzy or fainting spells, do not sit or stand up quickly, especially if you are an older patient. Alcohol can  increase drowsiness and dizziness. Avoid alcoholic drinks. Your mouth may get dry. Chewing sugarless gum or sucking hard candy, and drinking plenty of water will help. The use of this medicine may increase the chance of suicidal thoughts or actions. Pay special attention to how you are responding while on this medicine. Any worsening of mood, or thoughts of suicide or dying should be reported to your health care professional right away. Women who become pregnant while using this medicine may enroll in the [place] Pregnancy Registry by calling [phone]. This registry collects information about the safety of antiepileptic drug use during pregnancy. What side effects may I notice from receiving this medicine? Side effects that you should report to your doctor or health care professional as soon as possible: -allergic reactions like skin rash, itching or hives, swelling of the face, lips, or tongue -worsening of mood, thoughts or actions of suicide or dying Side effects that usually do not require medical attention (report to your doctor or health care professional if they continue or are bothersome): -constipation -difficulty walking or controlling muscle movements -dizziness -nausea -slurred speech -tiredness -tremors -weight gain This list may not describe all possible side effects. Call your doctor for medical advice about side effects. You may report side effects to FDA at 1-800-FDA-1088. Where should I keep my medicine? Keep out of reach of children. This medicine may cause accidental overdose and death if it taken by other adults, children, or pets. Mix any unused medicine with a substance like cat litter or coffee grounds. Then throw the medicine away in a sealed container like a sealed bag or a coffee can with a lid. Do not use the medicine after the expiration date. Store at room temperature between 15 and 30 degrees C (59 and 86 degrees F). NOTE: This  sheet is a summary. It may not cover all possible information. If you have questions about this medicine, talk to your doctor, pharmacist, or health care provider.  2018 Elsevier/Gold Standard (2013-03-20 15:26:50) Restless Legs Syndrome Restless legs syndrome is a condition that causes uncomfortable feelings or sensations in the legs, especially while sitting or lying down. The sensations usually cause an overwhelming urge to move the legs. The arms can also sometimes be affected. The condition can range from mild to severe. The symptoms often interfere with a person's ability to sleep. What are the causes? The cause of this condition is not known. What increases the risk? This condition is more likely to develop in:  People who are older than age 64.  Pregnant women. In general, restless legs syndrome is more common in women than in men.  People who have a family history of the condition.  People who have certain medical conditions, such as iron deficiency, kidney disease, Parkinson disease, or nerve damage.  People who take certain medicines, such as medicines for high blood pressure, nausea, colds, allergies, depression, and some heart conditions.  What are the signs or symptoms? The main symptom of this condition is uncomfortable sensations in the legs. These sensations may be:  Described as pulling, tingling, prickling, throbbing, crawling, or burning.  Worse while you are sitting or lying down.  Worse during periods of rest or inactivity.  Worse at night, often interfering with your sleep.  Accompanied by a very strong urge to move your legs.  Temporarily relieved by movement of your legs.  The sensations usually affect both sides of the body. The arms can also be affected, but this is rare. People who have this condition often have tiredness during the day because of their lack of sleep at night. How is this diagnosed? This condition may be diagnosed based on your  description of the symptoms. You may also have tests, including blood tests, to check for other conditions that may lead to your symptoms. In some cases, you may be asked to spend some time in a sleep lab so your sleeping can be monitored. How is this treated? Treatment for this condition is focused on managing the symptoms. Treatment may include:  Self-help and lifestyle changes.  Medicines.  Follow these instructions at home:  Take medicines only as directed by your health care provider.  Try these methods to get temporary relief from the uncomfortable sensations: ? Massage your legs. ? Walk or stretch. ? Take a cold or hot bath.  Practice good sleep habits. For example, go to bed and get up at the same time every day.  Exercise regularly.  Practice ways of relaxing, such as yoga or meditation.  Avoid caffeine and alcohol.  Do not use any tobacco products, including cigarettes, chewing tobacco, or electronic cigarettes. If you need help quitting, ask your health care provider.  Keep all follow-up visits as directed by your health care provider. This is important. Contact a health care provider if: Your symptoms do not improve with treatment, or they get worse. This information is not intended to replace advice given to you by your health care provider. Make sure you discuss any questions you have with your health care provider. Document Released: 01/12/2002 Document Revised: 06/30/2015 Document Reviewed: 01/18/2014 Elsevier Interactive Patient Education  [employer].

## 2017-10-21 ENCOUNTER — Encounter: Payer: Self-pay | Admitting: Nurse Practitioner

## 2017-10-22 ENCOUNTER — Other Ambulatory Visit: Payer: Self-pay | Admitting: Nurse Practitioner

## 2017-10-22 DIAGNOSIS — J4540 Moderate persistent asthma, uncomplicated: Secondary | ICD-10-CM

## 2017-10-22 DIAGNOSIS — J45909 Unspecified asthma, uncomplicated: Secondary | ICD-10-CM

## 2017-10-22 MED ORDER — BUDESONIDE-FORMOTEROL FUMARATE 80-4.5 MCG/ACT IN AERO: 2.0000 | INHALATION_SPRAY | Freq: Two times a day (BID) | RESPIRATORY_TRACT | 3 refills | Status: DC

## 2017-10-22 MED ORDER — ALBUTEROL SULFATE HFA 108 (90 BASE) MCG/ACT IN AERS: INHALATION_SPRAY | RESPIRATORY_TRACT | 5 refills | Status: DC

## 2017-10-25 ENCOUNTER — Telehealth: Payer: Self-pay | Admitting: Psychiatry

## 2017-10-25 ENCOUNTER — Telehealth: Payer: Self-pay

## 2017-10-25 ENCOUNTER — Encounter: Payer: Self-pay | Admitting: Nurse Practitioner

## 2017-10-25 NOTE — Telephone Encounter (Signed)
pt states that she is sleep walking pt husband confirmed that she is doing this and wanted to know if it is the medication she is taking

## 2017-10-25 NOTE — Telephone Encounter (Signed)
Spoke to patient

## 2017-10-25 NOTE — Telephone Encounter (Signed)
Called patient to discuss her sleep walking . She says it started after the gabapentin was started . Discussed stopping gabapentin. She and her husband will monitor it closely .

## 2017-10-31 ENCOUNTER — Encounter: Payer: Self-pay | Admitting: Nurse Practitioner

## 2017-10-31 DIAGNOSIS — K219 Gastro-esophageal reflux disease without esophagitis: Secondary | ICD-10-CM

## 2017-11-01 MED ORDER — ESOMEPRAZOLE MAGNESIUM 20 MG PO CPDR: 20.0000 mg | DELAYED_RELEASE_CAPSULE | Freq: Every day | ORAL | 5 refills | Status: DC

## 2017-11-01 NOTE — Telephone Encounter (Signed)
Mychart message

## 2017-11-14 ENCOUNTER — Other Ambulatory Visit: Payer: Self-pay | Admitting: Nurse Practitioner

## 2017-11-14 DIAGNOSIS — E785 Hyperlipidemia, unspecified: Secondary | ICD-10-CM

## 2017-11-14 DIAGNOSIS — E781 Pure hyperglyceridemia: Secondary | ICD-10-CM

## 2017-11-14 DIAGNOSIS — I10 Essential (primary) hypertension: Secondary | ICD-10-CM

## 2017-11-19 ENCOUNTER — Other Ambulatory Visit: Payer: Medicare Other

## 2017-11-19 ENCOUNTER — Ambulatory Visit (INDEPENDENT_AMBULATORY_CARE_PROVIDER_SITE_OTHER): Payer: Medicare Other

## 2017-11-19 ENCOUNTER — Ambulatory Visit (INDEPENDENT_AMBULATORY_CARE_PROVIDER_SITE_OTHER): Payer: Medicare Other | Admitting: Nurse Practitioner

## 2017-11-19 ENCOUNTER — Encounter: Payer: Self-pay | Admitting: Nurse Practitioner

## 2017-11-19 VITALS — BP 118/62 | HR 64 | Temp 98.0°F | Resp 16 | Ht 62.0 in | Wt 189.2 lb

## 2017-11-19 VITALS — BP 118/62 | HR 64 | Temp 98.0°F | Ht 62.0 in | Wt 189.2 lb

## 2017-11-19 DIAGNOSIS — Z0000 Encounter for general adult medical examination without abnormal findings: Secondary | ICD-10-CM

## 2017-11-19 DIAGNOSIS — M62830 Muscle spasm of back: Secondary | ICD-10-CM

## 2017-11-19 DIAGNOSIS — I10 Essential (primary) hypertension: Secondary | ICD-10-CM | POA: Diagnosis not present

## 2017-11-19 DIAGNOSIS — G40909 Epilepsy, unspecified, not intractable, without status epilepticus: Secondary | ICD-10-CM

## 2017-11-19 DIAGNOSIS — Z794 Long term (current) use of insulin: Secondary | ICD-10-CM

## 2017-11-19 DIAGNOSIS — N926 Irregular menstruation, unspecified: Secondary | ICD-10-CM

## 2017-11-19 DIAGNOSIS — E1165 Type 2 diabetes mellitus with hyperglycemia: Secondary | ICD-10-CM

## 2017-11-19 DIAGNOSIS — F319 Bipolar disorder, unspecified: Secondary | ICD-10-CM

## 2017-11-19 DIAGNOSIS — Z23 Encounter for immunization: Secondary | ICD-10-CM | POA: Diagnosis not present

## 2017-11-19 DIAGNOSIS — Z79899 Other long term (current) drug therapy: Secondary | ICD-10-CM

## 2017-11-19 MED ORDER — BACLOFEN 10 MG PO TABS: 10.0000 mg | ORAL_TABLET | Freq: Three times a day (TID) | ORAL | 2 refills | Status: DC

## 2017-11-19 MED ORDER — LISINOPRIL 10 MG PO TABS: 10.0000 mg | ORAL_TABLET | Freq: Every day | ORAL | 1 refills | Status: DC

## 2017-11-19 MED ORDER — DULAGLUTIDE 0.75 MG/0.5ML ~~LOC~~ SOAJ: 0.7500 mg | SUBCUTANEOUS | 5 refills | Status: DC

## 2017-11-19 MED ORDER — PRIMIDONE 50 MG PO TABS: 50.0000 mg | ORAL_TABLET | Freq: Every day | ORAL | 1 refills | Status: DC

## 2017-11-19 NOTE — Patient Instructions (Addendum)
Vanessa Baker,   Thank you for coming in to clinic today.  1. START Trulicity 9.31 mg injection once weekly on Monday. 2. STOP Humalog after 1 full week on Trulicity 3. CONTINUE Levemir 8 units once daily at bedtime 4. CONTINUE metformin. - Continue CBG three times daily for 1 month. Then, twice daily (always breakfast and any other time you desire).  5. LABS today  6. Continue your birth control for now.  Please schedule a follow-up appointment with Cassell Smiles, AGNP. Return in about 3 months (around 02/19/2018) for diabetes.  If you have any other questions or concerns, please feel free to call the clinic or send a message through [place]. You may also schedule an earlier appointment if necessary.  You will receive a survey after today's visit either digitally by e-mail or paper by [employer]. Your experiences and feedback matter to [place].  Please respond so we know how we are doing as we provide care for you.   Cassell Smiles, DNP, AGNP-BC Adult Gerontology Nurse Practitioner [place]

## 2017-11-19 NOTE — Progress Notes (Signed)
Subjective:   Vanessa Baker is a 42 y.o. female who presents for an Initial Medicare Annual Wellness Visit.  Review of Systems      Cardiac Risk Factors include: advanced age (>86mn, >>37women);dyslipidemia;diabetes mellitus;hypertension;obesity (BMI >30kg/m2);smoking/ tobacco exposure     Objective:    Today's Vitals   11/19/17 0901  BP: 118/62  Pulse: 64  Resp: 16  Temp: 98 F (36.7 C)  TempSrc: Oral  Weight: 189 lb 3.2 oz (85.8 kg)  Height: '5\' 2"'$  (1.575 m)   Body mass index is 34.61 kg/m.  Advanced Directives 11/19/2017  Does Patient Have a Medical Advance Directive? No  Would patient like information on creating a medical advance directive? Yes (MAU/Ambulatory/Procedural Areas - Information given)    Current Medications (verified) Outpatient Encounter Medications as of 11/19/2017  Medication Sig  . ACCU-CHEK SOFTCLIX LANCETS lancets USE 1  TO CHECK GLUCOSE 4 TIMES DAILY AS DIRECTED.  .Marland Kitchenalbuterol (PROAIR HFA) 108 (90 Base) MCG/ACT inhaler Inhale 2 puffs into the lungs every 4 (four) hours as needed for wheezing or shortness of breath.  .Marland Kitchenatorvastatin (LIPITOR) 20 MG tablet TAKE 1 TABLET BY MOUTH ONCE DAILY  . baclofen (LIORESAL) 10 MG tablet Take 1 tablet (10 mg total) by mouth 3 (three) times daily.  . blood glucose meter kit and supplies Dispense based on patient and insurance preference. Use up to four times daily as directed. (FOR ICD-10 E10.9, E11.9).  . budesonide-formoterol (SYMBICORT) 80-4.5 MCG/ACT inhaler Inhale 2 puffs into the lungs 2 (two) times daily.  . Calcium Carb-Cholecalciferol (CALCIUM 600+D3 PO) Take 1 tablet by mouth daily.  . divalproex (DEPAKOTE) 500 MG DR tablet TAKE 1 TABLET BY MOUTH IN THE MORNING AND TAKE 2 TABLETS AT BEDTIME  . esomeprazole (NEXIUM) 20 MG capsule Take 1 capsule (20 mg total) by mouth daily at 12 noon.  . gabapentin (NEURONTIN) 300 MG capsule Take 1 capsule (300 mg total) by mouth at bedtime. For restless legs  .  glucose blood (ACCU-CHEK AVIVA PLUS) test strip USE 1 STRIP TO CHECK GLUCOSE 4 TIMES DAILY AS DIRECTED.  .Marland Kitchenibuprofen (ADVIL,MOTRIN) 200 MG tablet Take 400-800 mg by mouth every 6 (six) hours as needed (for pain or headaches).  . Insulin Detemir (LEVEMIR) 100 UNIT/ML Pen Inject 5 Units into the skin at bedtime. (Patient taking differently: Inject 8 Units into the skin at bedtime. )  . insulin lispro (HUMALOG) 100 UNIT/ML KiwkPen Administer at meals 1 unit per 50 over CBG of 150 up to 6 units (see chart).  . Insulin Pen Needle (CARETOUCH PEN NEEDLES) 32G X 5 MM MISC 1 Device by Does not apply route 4 (four) times daily.  .Marland Kitchenlisinopril (PRINIVIL,ZESTRIL) 10 MG tablet TAKE 1 TABLET BY MOUTH ONCE DAILY  . metFORMIN (GLUCOPHAGE) 500 MG tablet Take 1 tablet (500 mg total) by mouth 2 (two) times daily with a meal.  . norethindrone-ethinyl estradiol (JUNEL FE,GILDESS FE,LOESTRIN FE) 1-20 MG-MCG tablet Take 1 tablet by mouth daily.  .Marland KitchenOLANZapine (ZYPREXA) 10 MG tablet Take 1 tablet (10 mg total) by mouth at bedtime.  .Marland Kitchenomega-3 acid ethyl esters (LOVAZA) 1 g capsule TAKE 1 CAPSULE BY MOUTH TWICE DAILY  . primidone (MYSOLINE) 50 MG tablet Take 50 mg by mouth at bedtime.  . sertraline (ZOLOFT) 100 MG tablet Take 1 tablet (100 mg total) by mouth daily.  . traZODone (DESYREL) 50 MG tablet Take 0.5-1 tablets (25-50 mg total) by mouth at bedtime as needed for sleep.  . trihexyphenidyl (  ARTANE) 5 MG tablet Take 1 tablet (5 mg total) by mouth 3 (three) times daily with meals.  . [DISCONTINUED] phenazopyridine (PYRIDIUM) 95 MG tablet Take 95 mg by mouth 3 (three) times daily as needed for pain.   No facility-administered encounter medications on file as of 11/19/2017.     Allergies (verified) Amitriptyline   History: Past Medical History:  Diagnosis Date  . Allergy   . Anxiety   . Asthma   . Bipolar disorder ([place])   . Bursitis   . Depression   . Diabetes mellitus ([place]) 09/2017  . Diabetes mellitus, type  II ([place])   . Essential hypertension 03/28/2017  . Frequent headaches   . GERD (gastroesophageal reflux disease)   . Headache   . High cholesterol   . Hypertension   . Irritable bowel syndrome (IBS)   . Irritable colon 02/23/2010  . Migraine 04/02/2017  . Obesity (BMI 30-39.9) 11/25/2013  . Seizure ([place]) [phone]  . Seizures ([place])   . Tremor    Past Surgical History:  Procedure Laterality Date  . NO PAST SURGERIES     Family History  Problem Relation Age of Onset  . Bipolar disorder Mother   . Schizophrenia Mother   . Hypertension Mother   . Diabetes Mother   . Cancer Mother   . Anxiety disorder Mother   . ADD / ADHD Mother   . Alcohol abuse Mother   . Drug abuse Mother   . Bipolar disorder Sister   . Anxiety disorder Sister   . Drug abuse Sister   . Bipolar disorder Brother   . Anxiety disorder Brother   . Drug abuse Brother   . Hypertension Father   . Cancer Father   . Breast cancer Maternal Aunt   . Breast cancer Paternal Aunt   . Breast cancer Maternal Grandmother   . Alcohol abuse Maternal Grandmother   . Breast cancer Paternal Grandmother   . Parkinson's disease Maternal Grandfather   . Alcohol abuse Maternal Grandfather   . Alcohol abuse Maternal Uncle   . ADD / ADHD Son   . Tremor Neg Hx    Social History   Socioeconomic History  . Marital status: Married    Spouse name: dewey   . Number of children: 1  . Years of education: 66  . Highest education level: Associate degree: occupational, Hotel manager, or vocational program  Occupational History  . Occupation: disability  Social Needs  . Financial resource strain: Very hard  . Food insecurity:    Worry: Often true    Inability: Often true  . Transportation needs:    Medical: No    Non-medical: No  Tobacco Use  . Smoking status: Current Every Day Smoker    Packs/day: 0.50    Years: 27.00    Pack years: 13.50    Types: Cigarettes  . Smokeless tobacco: Never Used  Substance and Sexual Activity  .  Alcohol use: Not Currently  . Drug use: No  . Sexual activity: Yes    Partners: Male    Birth control/protection: None  Lifestyle  . Physical activity:    Days per week: 0 days    Minutes per session: 0 min  . Stress: Very much  Relationships  . Social connections:    Talks on phone: Never    Gets together: Never    Attends religious service: Never    Active member of club or organization: No    Attends meetings of clubs or organizations: Never  Relationship status: Married  Other Topics Concern  . Not on file  Social History Narrative   Lives with husband, [name] (on [place])   Right-handed   Caffeine use: 1 cup coffee a day, 2 soft drinks per day    Tobacco Counseling Ready to quit: No Counseling given: Yes   Clinical Intake:  Pre-visit preparation completed: Yes  Pain : No/denies pain       Nutrition Risk Assessment:  Has the patient had any N/V/D within the last 2 months?  No  Does the patient have any non-healing wounds?  No  Has the patient had any unintentional weight loss or weight gain?  No   Diabetes:  Is the patient diabetic?  Yes  If diabetic, was a CBG obtained today?  yes Did the patient bring in their glucometer from home?  No  How often do you monitor your CBG's? 3 times a day .   Financial Strains and Diabetes Management:  Are you having any financial strains with the device, your supplies or your medication? Yes .   Diabetic Exams:  Diabetic Eye Exam:Overdue for diabetic eye exam. Pt has been advised about the importance in completing this exam. information on local eye doctors was given to patient. Message sent to referral coordinator for scheduling purposes.  Diabetic Foot Exam: Pt has been advised about the importance in completing this exam. Due now    How often do you need to have someone help you when you read instructions, pamphlets, or other written materials from your doctor or pharmacy?: 1 - Never What is the last  grade level you completed in school?: associates degree  Interpreter Needed?: No  Information entered by :: Francy Mcilvaine,LPN    Activities of Daily Living In your present state of health, do you have any difficulty performing the following activities: 11/19/2017 02/28/2017  Hearing? N N  Comment declines hearing aids  -  Vision? N Y  Comment wears glasses -  Difficulty concentrating or making decisions? N Y  Walking or climbing stairs? Y Y  Comment hip pain  balance issues  Dressing or bathing? N N  Doing errands, shopping? N N  Preparing Food and eating ? N -  Comment wears dentures  -  Using the Toilet? N -  In the past six months, have you accidently leaked urine? N -  Do you have problems with loss of bowel control? N -  Managing your Medications? N -  Managing your Finances? N -  Housekeeping or managing your Housekeeping? N -  Some recent data might be hidden     Immunizations and Health Maintenance Immunization History  Administered Date(s) Administered  . Influenza Inj Mdck Quad Pf 12/14/2015  . Influenza,inj,Quad PF,6+ Mos 02/28/2017, 11/19/2017  . Pneumococcal Polysaccharide-23 11/19/2017  . Tdap 03/08/2017   Health Maintenance Due  Topic Date Due  . PNEUMOCOCCAL POLYSACCHARIDE VACCINE AGE 69-64 HIGH RISK  01/24/1976  . FOOT EXAM  01/24/1984  . OPHTHALMOLOGY EXAM  01/24/1984  . TETANUS/TDAP  01/23/1993  . INFLUENZA VACCINE  09/05/2017    Patient Care Team: Mikey College, NP as PCP - General (Nurse Practitioner) Rico Junker, RN as Registered Nurse Theodore Demark, RN as Registered Nurse  Indicate any recent Medical Services you may have received from other than [facility] providers in the past year (date may be approximate).     Assessment:   This is a routine wellness examination for Makari.  Hearing/Vision screen Vision Screening Comments:  No current eye doctor   Dietary issues and exercise activities discussed: Current Exercise Habits:  Home exercise routine, Type of exercise: walking, Time (Minutes): 20, Frequency (Times/Week): 7, Weekly Exercise (Minutes/Week): 140, Intensity: Mild, Exercise limited by: None identified  Goals    . Quit Smoking     Smoking cessation discussed      Depression Screen PHQ 2/9 Scores 11/19/2017 09/06/2017 03/28/2017 02/28/2017  PHQ - 2 Score '4 4 6 6  '$ PHQ- 9 Score '17 18 25 19    '$ Fall Risk Fall Risk  11/19/2017  Falls in the past year? No    FALL RISK PREVENTION PERTAINING TO THE HOME:  Any stairs in or around the home WITH handrails? Yes  Home free of loose throw rugs in walkways, pet beds, electrical cords, etc? Yes  Adequate lighting in your home to reduce risk of falls? Yes   ASSISTIVE DEVICES UTILIZED TO PREVENT FALLS:  Life alert? No  Use of a cane, walker or w/c? No  Grab bars in the bathroom? No  Shower chair or bench in shower? No  Elevated toilet seat or a handicapped toilet? No   DME ORDERS:  DME order needed?  No   TIMED UP AND GO:  Was the test performed? Yes .  Length of time to ambulate 10 feet: 8 sec.   GAIT:  Appearance of gait: Gait stead-fast and without the use of an assistive device. O Education: Fall risk prevention has been discussed.  Intervention(s) required? No    Cognitive Function:     6CIT Screen 11/19/2017  What Year? 0 points  What month? 0 points  What time? 0 points  Count back from 20 0 points  Months in reverse 0 points  Repeat phrase 0 points  Total Score 0    Screening Tests Health Maintenance  Topic Date Due  . PNEUMOCOCCAL POLYSACCHARIDE VACCINE AGE 56-64 HIGH RISK  01/24/1976  . FOOT EXAM  01/24/1984  . OPHTHALMOLOGY EXAM  01/24/1984  . TETANUS/TDAP  01/23/1993  . INFLUENZA VACCINE  09/05/2017  . HEMOGLOBIN A1C  02/13/2018  . PAP SMEAR  06/01/2018  . HIV Screening  Completed    Qualifies for Shingles Vaccine? No    Tdap: completed per patient 03/2017  Flu Vaccine: Due for Flu vaccine. Does the patient want to  receive this vaccine today?  Yes . Completed today   Pneumococcal Vaccine: Due for Pneumococcal vaccine. Does the patient want to receive this vaccine today?  Yes . Pneumovax 23 done today, discussed that this was a booster prior to turning 63 due to her being a diabetic. She will receive series at age 61  Cancer Screenings:  Colorectal Screening: not indicated  Mammogram: Completed 06/2015 due to family history, Ordered.   Bone Density: not indicated   Lung Cancer Screening: (Low Dose CT Chest recommended if Age 76-80 years, 30 pack-year currently smoking OR have quit w/in 15years.) does not qualify.   Additional Screening:  Hepatitis C Screening: does not qualify;   Dental Screening: Recommended annual dental exams for proper oral hygiene  Community Resource Referral:  CRR required this visit?  Yes  states her husband was in a car accident in April and they are only getting the disability check for her currently. They have run into financial difficulties paying bills and food. CRR placed for food banks and possible financial assistance.    Plan:    I have personally reviewed and addressed the Medicare Annual Wellness questionnaire and have noted the  following in the patient's chart:  A. Medical and social history B. Use of alcohol, tobacco or illicit drugs  C. Current medications and supplements D. Functional ability and status E.  Nutritional status F.  Physical activity G. Advance directives H. List of other physicians I.  Hospitalizations, surgeries, and ER visits in previous 12 months J.  [place] such as hearing and vision if needed, cognitive and depression L. Referrals and appointments   In addition, I have reviewed and discussed with patient certain preventive protocols, quality metrics, and best practice recommendations. A written personalized care plan for preventive services as well as general preventive health recommendations were provided to  patient.   Signed,  Tyler Aas, LPN Nurse Health Advisor   Nurse Notes:none

## 2017-11-19 NOTE — Patient Instructions (Addendum)
Ms. Ellwanger , Thank you for taking time to come for your Medicare Wellness Visit. I appreciate your ongoing commitment to your health goals. Please review the following plan we discussed and let me know if I can assist you in the future.   Screening recommendations/referrals: Colonoscopy: due at age 42 Mammogram: completed 06/15/2015 Please call [phone] to schedule your mammogram.  Bone Density: due at age 75 Recommended yearly ophthalmology/optometry visit for glaucoma screening and checkup Recommended yearly dental visit for hygiene and checkup  Vaccinations: Influenza vaccine: done today  Pneumococcal vaccine: pneumovax 23 done today, prevnar 13 due at age 84 Tdap vaccine: completed 03/08/2017 Shingles vaccine: shingrix eligible at age 15    Advanced directives: Advance directive discussed with you today. I have provided a copy for you to complete at home and have notarized. Once this is complete please bring a copy in to our office so we can scan it into your chart.  Conditions/risks identified: smoking cessation discussed. Not ready to quit. If you wish to quit smoking, help is available. For free tobacco cessation program offerings call the [facility] at [phone] or Live Well Line at [phone]. You may also visit [email or web address] or email [email or web address] for more information on other programs.   Next appointment: Follow up in one year for your annual wellness exam.   Preventive Care 40-64 Years, Female Preventive care refers to lifestyle choices and visits with your health care provider that can promote health and wellness. What does preventive care include?  A yearly physical exam. This is also called an annual well check.  Dental exams once or twice a year.  Routine eye exams. Ask your health care provider how often you should have your eyes checked.  Personal lifestyle choices, including:  Daily care of your teeth and  gums.  Regular physical activity.  Eating a healthy diet.  Avoiding tobacco and drug use.  Limiting alcohol use.  Practicing safe sex.  Taking low-dose aspirin daily starting at age 39.  Taking vitamin and mineral supplements as recommended by your health care provider. What happens during an annual well check? The services and screenings done by your health care provider during your annual well check will depend on your age, overall health, lifestyle risk factors, and family history of disease. Counseling  Your health care provider may ask you questions about your:  Alcohol use.  Tobacco use.  Drug use.  Emotional well-being.  Home and relationship well-being.  Sexual activity.  Eating habits.  Work and work Statistician.  Method of birth control.  Menstrual cycle.  Pregnancy history. Screening  You may have the following tests or measurements:  Height, weight, and BMI.  Blood pressure.  Lipid and cholesterol levels. These may be checked every 5 years, or more frequently if you are over 60 years old.  Skin check.  Lung cancer screening. You may have this screening every year starting at age 47 if you have a 30-pack-year history of smoking and currently smoke or have quit within the past 15 years.  Fecal occult blood test (FOBT) of the stool. You may have this test every year starting at age 26.  Flexible sigmoidoscopy or colonoscopy. You may have a sigmoidoscopy every 5 years or a colonoscopy every 10 years starting at age 43.  Hepatitis C blood test.  Hepatitis B blood test.  Sexually transmitted disease (STD) testing.  Diabetes screening. This is done by checking your blood sugar (glucose) after you have not eaten for  a while (fasting). You may have this done every 1-3 years.  Mammogram. This may be done every 1-2 years. Talk to your health care provider about when you should start having regular mammograms. This may depend on whether you have a  family history of breast cancer.  BRCA-related cancer screening. This may be done if you have a family history of breast, ovarian, tubal, or peritoneal cancers.  Pelvic exam and Pap test. This may be done every 3 years starting at age 49. Starting at age 58, this may be done every 5 years if you have a Pap test in combination with an HPV test.  Bone density scan. This is done to screen for osteoporosis. You may have this scan if you are at high risk for osteoporosis. Discuss your test results, treatment options, and if necessary, the need for more tests with your health care provider. Vaccines  Your health care provider may recommend certain vaccines, such as:  Influenza vaccine. This is recommended every year.  Tetanus, diphtheria, and acellular pertussis (Tdap, Td) vaccine. You may need a Td booster every 10 years.  Zoster vaccine. You may need this after age 12.  Pneumococcal 13-valent conjugate (PCV13) vaccine. You may need this if you have certain conditions and were not previously vaccinated.  Pneumococcal polysaccharide (PPSV23) vaccine. You may need one or two doses if you smoke cigarettes or if you have certain conditions. Talk to your health care provider about which screenings and vaccines you need and how often you need them. This information is not intended to replace advice given to you by your health care provider. Make sure you discuss any questions you have with your health care provider. Document Released: 02/18/2015 Document Revised: 10/12/2015 Document Reviewed: 11/23/2014 Elsevier Interactive Patient Education  2017 [place] Prevention in the Home Falls can cause injuries. They can happen to people of all ages. There are many things you can do to make your home safe and to help prevent falls. What can I do on the outside of my home?  Regularly fix the edges of walkways and driveways and fix any cracks.  Remove anything that might make you trip as  you walk through a door, such as a raised step or threshold.  Trim any bushes or trees on the path to your home.  Use bright outdoor lighting.  Clear any walking paths of anything that might make someone trip, such as rocks or tools.  Regularly check to see if handrails are loose or broken. Make sure that both sides of any steps have handrails.  Any raised decks and porches should have guardrails on the edges.  Have any leaves, snow, or ice cleared regularly.  Use sand or salt on walking paths during winter.  Clean up any spills in your garage right away. This includes oil or grease spills. What can I do in the bathroom?  Use night lights.  Install grab bars by the toilet and in the tub and shower. Do not use towel bars as grab bars.  Use non-skid mats or decals in the tub or shower.  If you need to sit down in the shower, use a plastic, non-slip stool.  Keep the floor dry. Clean up any water that spills on the floor as soon as it happens.  Remove soap buildup in the tub or shower regularly.  Attach bath mats securely with double-sided non-slip rug tape.  Do not have throw rugs and other things on the floor  that can make you trip. What can I do in the bedroom?  Use night lights.  Make sure that you have a light by your bed that is easy to reach.  Do not use any sheets or blankets that are too big for your bed. They should not hang down onto the floor.  Have a firm chair that has side arms. You can use this for support while you get dressed.  Do not have throw rugs and other things on the floor that can make you trip. What can I do in the kitchen?  Clean up any spills right away.  Avoid walking on wet floors.  Keep items that you use a lot in easy-to-reach places.  If you need to reach something above you, use a strong step stool that has a grab bar.  Keep electrical cords out of the way.  Do not use floor polish or wax that makes floors slippery. If you must  use wax, use non-skid floor wax.  Do not have throw rugs and other things on the floor that can make you trip. What can I do with my stairs?  Do not leave any items on the stairs.  Make sure that there are handrails on both sides of the stairs and use them. Fix handrails that are broken or loose. Make sure that handrails are as long as the stairways.  Check any carpeting to make sure that it is firmly attached to the stairs. Fix any carpet that is loose or worn.  Avoid having throw rugs at the top or bottom of the stairs. If you do have throw rugs, attach them to the floor with carpet tape.  Make sure that you have a light switch at the top of the stairs and the bottom of the stairs. If you do not have them, ask someone to add them for you. What else can I do to help prevent falls?  Wear shoes that:  Do not have high heels.  Have rubber bottoms.  Are comfortable and fit you well.  Are closed at the toe. Do not wear sandals.  If you use a stepladder:  Make sure that it is fully opened. Do not climb a closed stepladder.  Make sure that both sides of the stepladder are locked into place.  Ask someone to hold it for you, if possible.  Clearly mark and make sure that you can see:  Any grab bars or handrails.  First and last steps.  Where the edge of each step is.  Use tools that help you move around (mobility aids) if they are needed. These include:  Canes.  Walkers.  Scooters.  Crutches.  Turn on the lights when you go into a dark area. Replace any light bulbs as soon as they burn out.  Set up your furniture so you have a clear path. Avoid moving your furniture around.  If any of your floors are uneven, fix them.  If there are any pets around you, be aware of where they are.  Review your medicines with your doctor. Some medicines can make you feel dizzy. This can increase your chance of falling. Ask your doctor what other things that you can do to help prevent  falls. This information is not intended to replace advice given to you by your health care provider. Make sure you discuss any questions you have with your health care provider. Document Released: 11/18/2008 Document Revised: 06/30/2015 Document Reviewed: 02/26/2014 Elsevier Interactive Patient Education  2017 Elsevier  [place] provider would like to you have your annual eye exam. Please contact your current eye doctor or here are some good options for you to contact.   [facility] Address: [address] [place], [place] [address]   Address: [address], [place], [place] [address]  Phone: [phone]      Phone: [phone]  Website: [email or web address]    Website: [email or web address]     [facility]  Address: [place], [place], [place] [address]   Address: [place], [place], [place] [address] Phone: [phone]      Phone: [phone]    [facility] Address: [place], [place],  [address]  Phone: [phone] Influenza (Flu) Vaccine (Inactivated or Recombinant): What You Need to Know 1. Why get vaccinated? Influenza ("flu") is a contagious disease that spreads around the [place] every year, usually between October and May. Flu is caused by influenza viruses, and is spread mainly by coughing, sneezing, and close contact. Anyone can get flu. Flu strikes suddenly and can last several days. Symptoms vary by age, but can include:  fever/chills  sore throat  muscle aches  fatigue  cough  headache  runny or stuffy nose  Flu can also lead to pneumonia and blood infections, and cause diarrhea and seizures in children. If you have a medical condition, such as heart or lung disease, flu can make it worse. Flu is more dangerous for some people. Infants and young children, people 48 years of age and older, pregnant women, and people with certain health conditions or a weakened  immune system are at greatest risk. Each year thousands of people in the [place] die from flu, and many more are hospitalized. Flu vaccine can:  keep you from getting flu,  make flu less severe if you do get it, and  keep you from spreading flu to your family and other people. 2. Inactivated and recombinant flu vaccines A dose of flu vaccine is recommended every flu season. Children 6 months through 105 years of age may need two doses during the same flu season. Everyone else needs only one dose each flu season. Some inactivated flu vaccines contain a very small amount of a mercury-based preservative called thimerosal. Studies have not shown thimerosal in vaccines to be harmful, but flu vaccines that do not contain thimerosal are available. There is no live flu virus in flu shots. They cannot cause the flu. There are many flu viruses, and they are always changing. Each year a new flu vaccine is made to protect against three or four viruses that are likely to cause disease in the upcoming flu season. But even when the vaccine doesn't exactly match these viruses, it may still provide some protection. Flu vaccine cannot prevent:  flu that is caused by a virus not covered by the vaccine, or  illnesses that look like flu but are not.  It takes about 2 weeks for protection to develop after vaccination, and protection lasts through the flu season. 3. Some people should not get this vaccine Tell the person who is giving you the vaccine:  If you have any severe, life-threatening allergies. If you ever had a life-threatening allergic reaction after a dose of flu vaccine, or have a severe allergy to any part of this vaccine, you may be advised not to get vaccinated.  Most, but not all, types of flu vaccine contain a small amount of egg protein.  If you ever had Guillain-Barr Syndrome (also called GBS). Some people with a history of GBS should not get this vaccine. This should be discussed with  your doctor.  If you are not feeling well. It is usually okay to get flu vaccine when you have a mild illness, but you might be asked to come back when you feel better.  4. Risks of a vaccine reaction With any medicine, including vaccines, there is a chance of reactions. These are usually mild and go away on their own, but serious reactions are also possible. Most people who get a flu shot do not have any problems with it. Minor problems following a flu shot include:  soreness, redness, or swelling where the shot was given  hoarseness  sore, red or itchy eyes  cough  fever  aches  headache  itching  fatigue  If these problems occur, they usually begin soon after the shot and last 1 or 2 days. More serious problems following a flu shot can include the following:  There may be a small increased risk of Guillain-Barre Syndrome (GBS) after inactivated flu vaccine. This risk has been estimated at 1 or 2 additional cases per million people vaccinated. This is much lower than the risk of severe complications from flu, which can be prevented by flu vaccine.  Young children who get the flu shot along with pneumococcal vaccine (PCV13) and/or DTaP vaccine at the same time might be slightly more likely to have a seizure caused by fever. Ask your doctor for more information. Tell your doctor if a child who is getting flu vaccine has ever had a seizure.  Problems that could happen after any injected vaccine:  People sometimes faint after a medical procedure, including vaccination. Sitting or lying down for about 15 minutes can help prevent fainting, and injuries caused by a fall. Tell your doctor if you feel dizzy, or have vision changes or ringing in the ears.  Some people get severe pain in the shoulder and have difficulty moving the arm where a shot was given. This happens very rarely.  Any medication can cause a severe allergic reaction. Such reactions from a vaccine are very rare,  estimated at about 1 in a million doses, and would happen within a few minutes to a few hours after the vaccination. As with any medicine, there is a very remote chance of a vaccine causing a serious injury or death. The safety of vaccines is always being monitored. For more information, visit: http://www.aguilar.org/ 5. What if there is a serious reaction? What should I look for? Look for anything that concerns you, such as signs of a severe allergic reaction, very high fever, or unusual behavior. Signs of a severe allergic reaction can include hives, swelling of the face and throat, difficulty breathing, a fast heartbeat, dizziness, and weakness. These would start a few minutes to a few hours after the vaccination. What should I do?  If you think it is a severe allergic reaction or other emergency that can't wait, call 9-1-1 and get the person to the nearest hospital. Otherwise, call your doctor.  Reactions should be reported to the Vaccine Adverse Event Reporting System (VAERS). Your doctor should file this report, or you can do it yourself through the VAERS web site at www.vaers.SamedayNews.es, or by calling 442-356-4270. ? VAERS does not give medical advice. 6. The National Vaccine Injury [employer]  Vaccine Injury Compensation Program (VICP) is a federal program that was created to compensate people who may have been injured by certain vaccines. Persons who believe they may have been injured by a vaccine can learn about the program and about filing a claim by calling 669-287-8152 or visiting the [place] website at [email or web address].com.ee. There is a time limit to file a claim for compensation. 7. How can I learn more?  Ask your healthcare provider. He or she can give you the vaccine package insert or suggest other sources of information.  Call your local or state health department.  Contact the Centers for Disease Control and Prevention (CDC): ? Call  4841905943 (1-800-CDC-INFO) or ? Visit CDC's website at https://[place]son.com/ Vaccine Information Statement, Inactivated Influenza Vaccine (09/11/2013) This information is not intended to replace advice given to you by your health care provider. Make sure you discuss any questions you have with your health care provider. Document Released: 11/16/2005 Document Revised: 10/13/2015 Document Reviewed: 10/13/2015 Elsevier Interactive Patient Education  2017 [employer].   Pneumococcal Polysaccharide Vaccine: What You Need to Know 1. Why get vaccinated? Vaccination can protect older adults (and some children and younger adults) from pneumococcal disease. Pneumococcal disease is caused by bacteria that can spread from person to person through close contact. It can cause ear infections, and it can also lead to more serious infections of the:  Lungs (pneumonia),  Blood (bacteremia), and  Covering of the brain and spinal cord (meningitis). Meningitis can cause deafness and brain damage, and it can be fatal.  Anyone can get pneumococcal disease, but children under 81 years of age, people with certain medical conditions, adults over 20 years of age, and cigarette smokers are at the highest risk. About 18,000 older adults die each year from pneumococcal disease in the [place]. Treatment of pneumococcal infections with penicillin and other drugs used to be more effective. But some strains of the disease have become resistant to these drugs. This makes prevention of the disease, through vaccination, even more important. 2. Pneumococcal polysaccharide vaccine (PPSV23) Pneumococcal polysaccharide vaccine (PPSV23) protects against 23 types of pneumococcal bacteria. It will not prevent all pneumococcal disease. PPSV23 is recommended for:  All adults 23 years of age and older,  Anyone 2 through 44 years of age with certain long-term health problems,  Anyone 2 through 44 years of age with a weakened  immune system,  Adults 73 through 44 years of age who smoke cigarettes or have asthma.  Most people need only one dose of PPSV. A second dose is recommended for certain high-risk groups. People 71 and older should get a dose even if they have gotten one or more doses of the vaccine before they turned 65. Your healthcare provider can give you more information about these recommendations. Most healthy adults develop protection within 2 to 3 weeks of getting the shot. 3. Some people should not get this vaccine  Anyone who has had a life-threatening allergic reaction to PPSV should not get another dose.  Anyone who has a severe allergy to any component of PPSV should not receive it. Tell your provider if you have any severe allergies.  Anyone who is moderately or severely ill when the shot is scheduled may be asked to wait until they recover before getting the vaccine. Someone with a mild illness can usually be vaccinated.  Children less than 11 years of age should not receive this vaccine.  There is no evidence that PPSV is harmful to either a pregnant woman  or to her fetus. However, as a precaution, women who need the vaccine should be vaccinated before becoming pregnant, if possible. 4. Risks of a vaccine reaction With any medicine, including vaccines, there is a chance of side effects. These are usually mild and go away on their own, but serious reactions are also possible. About half of people who get PPSV have mild side effects, such as redness or pain where the shot is given, which go away within about two days. Less than 1 out of 100 people develop a fever, muscle aches, or more severe local reactions. Problems that could happen after any vaccine:  People sometimes faint after a medical procedure, including vaccination. Sitting or lying down for about 15 minutes can help prevent fainting, and injuries caused by a fall. Tell your doctor if you feel dizzy, or have vision changes or ringing  in the ears.  Some people get severe pain in the shoulder and have difficulty moving the arm where a shot was given. This happens very rarely.  Any medication can cause a severe allergic reaction. Such reactions from a vaccine are very rare, estimated at about 1 in a million doses, and would happen within a few minutes to a few hours after the vaccination. As with any medicine, there is a very remote chance of a vaccine causing a serious injury or death. The safety of vaccines is always being monitored. For more information, visit: http://www.aguilar.org/ 5. What if there is a serious reaction? What should I look for? Look for anything that concerns you, such as signs of a severe allergic reaction, very high fever, or unusual behavior. Signs of a severe allergic reaction can include hives, swelling of the face and throat, difficulty breathing, a fast heartbeat, dizziness, and weakness. These would usually start a few minutes to a few hours after the vaccination. What should I do? If you think it is a severe allergic reaction or other emergency that can't wait, call 9-1-1 or get to the nearest hospital. Otherwise, call your doctor. Afterward, the reaction should be reported to the Vaccine Adverse Event Reporting System (VAERS). Your doctor might file this report, or you can do it yourself through the VAERS web site at www.vaers.SamedayNews.es, or by calling (386)031-5488. VAERS does not give medical advice. 6. How can I learn more?  Ask your doctor. He or she can give you the vaccine package insert or suggest other sources of information.  Call your local or state health department.  Contact the Centers for Disease Control and Prevention (CDC): ? Call (351) 187-0451 (1-800-CDC-INFO) or ? Visit CDC's website at http://hunter.com/ CDC Pneumococcal Polysaccharide Vaccine VIS (05/29/13) This information is not intended to replace advice given to you by your health care provider. Make sure you  discuss any questions you have with your health care provider. Document Released: 11/19/2005 Document Revised: 10/13/2015 Document Reviewed: 10/13/2015 Elsevier Interactive Patient Education  2017 [employer].

## 2017-11-19 NOTE — Progress Notes (Signed)
Subjective:    Patient ID: Vanessa Baker, female    DOB: 1975-02-09, 42 y.o.   MRN: [id number]  Vanessa Baker is a 42 y.o. female presenting on 11/19/2017 for Diabetes   HPI Diabetes Pt presents today for follow up of Type 2 diabetes mellitus. She is checking fasting am CBG at home with a range of  \  96-257, usual 110-140 L: 97-243, usual 150-180 S: 100-250, usual 120-150 - Current diabetic medications include: metformin, levemir, humalog - She is symptomatic with polydipsia, polyuria (likely also from psychiatric meds).  - She denies headaches, diaphoresis, shakiness, chills, pain, numbness or tingling in extremities and changes in vision.   - Clinical course has been improving. - She  reports no regular exercise routine. - Her diet is high in salt, moderate in fat, and high in carbohydrates.  Relies heavily on food banks for food sources.  Then supplements often with oodles of noodles. - Weight trend: stable  PREVENTION: Eye exam current (within one year): no Foot exam current (within one year): no  Lipid/ASCVD risk reduction - on statin: yes Kidney protection - on ace or arb: yes Recent Labs    04/22/17 1135 08/13/17 1033  HGBA1C 6.2* 6.4*     Suicidal Ideation Positive occasional SI, no plans to carry out.  Continues to be followed by Dr. Shea Evans.  Irregular menses 8/6 - "bad" with heavy flow at times through 13 8/30 - 9/3 more normal period 10/6 - only spotting x 1 day Patient is concerned for pregnancy with missed period/only spotting on OCP. Social History   Tobacco Use  . Smoking status: Current Every Day Smoker    Packs/day: 0.50    Years: 27.00    Pack years: 13.50    Types: Cigarettes  . Smokeless tobacco: Never Used  Substance Use Topics  . Alcohol use: Not Currently  . Drug use: No    Review of Systems Per HPI unless specifically indicated above     Objective:    BP 118/62 (BP Location: Right Arm, Patient Position:  Sitting, Cuff Size: Normal)   Pulse 64   Temp 98 F (36.7 C) (Oral)   Ht 5\' 2"  (1.575 m)   Wt 189 lb 3.2 oz (85.8 kg)   BMI 34.61 kg/m   Wt Readings from Last 3 Encounters:  11/19/17 189 lb 3.2 oz (85.8 kg)  10/01/17 191 lb 3.2 oz (86.7 kg)  09/12/17 192 lb 3.2 oz (87.2 kg)    Physical Exam  Constitutional: She is oriented to person, place, and time. She appears well-developed and well-nourished. No distress.  HENT:  Head: Normocephalic and atraumatic.  Neck: Normal range of motion. Neck supple. Carotid bruit is not present.  Cardiovascular: Normal rate, regular rhythm, S1 normal, S2 normal, normal heart sounds and intact distal pulses.  Pulmonary/Chest: Effort normal and breath sounds normal. No respiratory distress.  Abdominal: Soft. Bowel sounds are normal. She exhibits no distension. There is no hepatosplenomegaly. There is no tenderness. No hernia.  Musculoskeletal: She exhibits no edema (pedal).  Neurological: She is alert and oriented to person, place, and time.  Skin: Skin is warm and dry.  Psychiatric: She has a normal mood and affect. Her behavior is normal.  Vitals reviewed.    Results for orders placed or performed in visit on 10/01/17  POCT Urinalysis Dipstick  Result Value Ref Range   Color, UA yellow    Clarity, UA clear    Glucose, UA Negative Negative   Bilirubin,  UA negative    Ketones, UA negative    Spec Grav, UA 1.025 1.010 - 1.025   Blood, UA large    pH, UA 5.0 5.0 - 8.0   Protein, UA Negative Negative   Urobilinogen, UA 0.2 0.2 or 1.0 E.U./dL   Nitrite, UA negative    Leukocytes, UA Large (3+) (A) Negative   Appearance     Odor        Assessment & Plan:   Problem List Items Addressed This Visit      Cardiovascular and Mediastinum   Essential hypertension Controlled hypertension.  BP goal < 130/80.  Pt is working on lifestyle modifications as able but is limited by food bank food supply.  Taking medications tolerating well without side  effects. No complications.  Plan: 1. Continue taking lisinopril 10 mg once daily 2. Obtain labs   3. Encouraged heart healthy diet and increasing exercise to 30 minutes most days of the week. 4. Check BP 1-2 x per week at home, keep log, and bring to clinic at next appointment. 5. Follow up 3 months.     Relevant Medications   lisinopril (PRINIVIL,ZESTRIL) 10 MG tablet     Endocrine   Type 2 diabetes mellitus with hyperglycemia, with long-term current use of insulin (HCC) - Primary ControlledDM with A1c at goal stable from last check and goal A1c < 7.0%. - Complications - hyperglycemia.  Plan:  1. Change therapy:  -START Trulicity 1.70 mg injection once weekly on Monday.  Sample provided today. First dose administered with CMA assistance. - STOP Humalog after 1 full week on Trulicity - CONTINUE Levemir 8 units once daily at bedtime - CONTINUE metformin. 2. Encourage improved lifestyle: - low carb/low glycemic diet reinforced prior education - Increase physical activity to 30 minutes most days of the week.  Explained that increased physical activity increases body's use of sugar for energy. 3. Check fasting am CBG and bring log to next visit for review 4. Continue ACEi and Statin 5. Advised to schedule DM ophtho exam, send record. 6. Follow-up 3 mos    Relevant Medications   Dulaglutide (TRULICITY) 0.17 CB/4.4HQ SOPN   lisinopril (PRINIVIL,ZESTRIL) 10 MG tablet   Other Relevant Orders   COMPLETE METABOLIC PANEL WITH GFR (Completed)   Hemoglobin A1c (Completed)   Lipid panel (Completed)     Other   Bipolar I disorder (HCC) Labs needed for Dr. Shea Evans   Relevant Orders   Iron, TIBC and Ferritin Panel (Completed)    Other Visit Diagnoses    Spasm of muscle of lower back     Patient continues having low back spasms relieved by baclofen.  Refills provided.  Follow-up prn.   Relevant Medications   baclofen (LIORESAL) 10 MG tablet   Seizure disorder (HCC)     Stable without  seizure.  Patient needs refill of primidone.  Provided today.  Recommend follow-up with neurology.   Relevant Medications   primidone (MYSOLINE) 50 MG tablet   High risk medication use     Patient needs labs for Dr. Shea Evans for bipolar medications.  Labs ordered for collection here to route to Dr. Shea Evans.   Relevant Orders   TSH (Completed)   Iron, TIBC and Ferritin Panel (Completed)   Irregular menses     Patient continues having irregular periods.  Is concerned she may be pregnant with missed period on OCP today. Continue to recommend OBGYN, but patient is reluctant to go for specialist copay.   Relevant Orders  hCG, serum, qualitative (Completed)      Meds ordered this encounter  Medications  . Dulaglutide (TRULICITY) 5.02 DX/4.1OI SOPN    Sig: Inject 0.75 mg into the skin once a week.    Dispense:  4 pen    Refill:  5    Order Specific Question:   Supervising Provider    Answer:   Olin Hauser [[id number]]  . baclofen (LIORESAL) 10 MG tablet    Sig: Take 1 tablet (10 mg total) by mouth 3 (three) times daily.    Dispense:  60 each    Refill:  2    Order Specific Question:   Supervising Provider    Answer:   Olin Hauser [[id number]]  . lisinopril (PRINIVIL,ZESTRIL) 10 MG tablet    Sig: Take 1 tablet (10 mg total) by mouth daily.    Dispense:  90 tablet    Refill:  1    Order Specific Question:   Supervising Provider    Answer:   Olin Hauser [[id number]]  . primidone (MYSOLINE) 50 MG tablet    Sig: Take 1 tablet (50 mg total) by mouth at bedtime.    Dispense:  90 tablet    Refill:  1    Order Specific Question:   Supervising Provider    Answer:   Olin Hauser [[id number]]   Follow up plan: Return in about 3 months (around 02/19/2018) for diabetes.  Cassell Smiles, DNP, AGPCNP-BC Adult Gerontology Primary Care Nurse Practitioner [facility] 11/19/2017, 9:35 AM

## 2017-11-20 LAB — COMPLETE METABOLIC PANEL WITH GFR
AG Ratio: 1.6 (calc) (ref 1.0–2.5)
ALT: 7 U/L (ref 6–29)
AST: 11 U/L (ref 10–30)
Albumin: 3.7 g/dL (ref 3.6–5.1)
Alkaline phosphatase (APISO): 46 U/L (ref 33–115)
BUN: 10 mg/dL (ref 7–25)
CO2: 26 mmol/L (ref 20–32)
Calcium: 9.1 mg/dL (ref 8.6–10.2)
Chloride: 97 mmol/L — ABNORMAL LOW (ref 98–110)
Creat: 0.58 mg/dL (ref 0.50–1.10)
GFR, Est African American: 131 mL/min/{1.73_m2} (ref >=60)
GFR, Est Non African American: 113 mL/min/{1.73_m2} (ref >=60)
Globulin: 2.3 g/dL (calc) (ref 1.9–3.7)
Glucose, Bld: 94 mg/dL (ref 65–99)
Potassium: 4.7 mmol/L (ref 3.5–5.3)
Sodium: 136 mmol/L (ref 135–146)
Total Bilirubin: 0.2 mg/dL (ref 0.2–1.2)
Total Protein: 6 g/dL — ABNORMAL LOW (ref 6.1–8.1)

## 2017-11-20 LAB — LIPID PANEL
Cholesterol: 153 mg/dL (ref <200)
HDL: 29 mg/dL — ABNORMAL LOW (ref >50)
LDL Cholesterol (Calc): 81 mg/dL (calc)
Non-HDL Cholesterol (Calc): 124 mg/dL (calc) (ref <130)
Total CHOL/HDL Ratio: 5.3 (calc) — ABNORMAL HIGH (ref <5.0)
Triglycerides: 356 mg/dL — ABNORMAL HIGH (ref <150)

## 2017-11-20 LAB — IRON,TIBC AND FERRITIN PANEL
%SAT: 16 % (calc) (ref 16–45)
Ferritin: 40 ng/mL (ref 16–232)
Iron: 73 ug/dL (ref 40–190)
TIBC: 468 mcg/dL (calc) — ABNORMAL HIGH (ref 250–450)

## 2017-11-20 LAB — HEMOGLOBIN A1C
Hgb A1c MFr Bld: 6.1 % of total Hgb — ABNORMAL HIGH (ref <5.7)
Mean Plasma Glucose: 128 (calc)
eAG (mmol/L): 7.1 (calc)

## 2017-11-20 LAB — HCG, SERUM, QUALITATIVE: Preg, Serum: NEGATIVE

## 2017-11-20 LAB — TSH: TSH: 2.08 mIU/L

## 2017-11-22 ENCOUNTER — Encounter: Payer: Self-pay | Admitting: Nurse Practitioner

## 2017-11-22 DIAGNOSIS — Z794 Long term (current) use of insulin: Principal | ICD-10-CM

## 2017-11-22 DIAGNOSIS — E1165 Type 2 diabetes mellitus with hyperglycemia: Secondary | ICD-10-CM | POA: Insufficient documentation

## 2017-11-25 ENCOUNTER — Other Ambulatory Visit: Payer: Self-pay | Admitting: Nurse Practitioner

## 2017-11-25 DIAGNOSIS — E781 Pure hyperglyceridemia: Secondary | ICD-10-CM

## 2017-11-26 ENCOUNTER — Telehealth: Payer: Self-pay | Admitting: Nurse Practitioner

## 2017-11-26 NOTE — Telephone Encounter (Signed)
Called Pt to discuss Community Resource Referral. c/b # [phone] Jill Alexanders See notes in referral

## 2017-12-09 ENCOUNTER — Telehealth: Payer: Self-pay | Admitting: Nurse Practitioner

## 2017-12-09 ENCOUNTER — Other Ambulatory Visit: Payer: Self-pay

## 2017-12-09 DIAGNOSIS — E1165 Type 2 diabetes mellitus with hyperglycemia: Secondary | ICD-10-CM

## 2017-12-09 DIAGNOSIS — Z794 Long term (current) use of insulin: Principal | ICD-10-CM

## 2017-12-09 MED ORDER — ACCU-CHEK SOFTCLIX LANCETS MISC: 3 refills | Status: DC

## 2017-12-09 NOTE — Telephone Encounter (Signed)
Pt called requesting refill on [facility] on [address]

## 2017-12-09 NOTE — Telephone Encounter (Signed)
Spoke with pt to provide update: I received the [place] from her landlord and submitted to [facility] so that they can send to [employer] for payment. knb will update referral notes

## 2017-12-11 ENCOUNTER — Encounter: Payer: Self-pay | Admitting: Nurse Practitioner

## 2017-12-13 ENCOUNTER — Encounter: Payer: Self-pay | Admitting: Nurse Practitioner

## 2017-12-17 ENCOUNTER — Ambulatory Visit: Payer: Medicare Other | Admitting: Psychiatry

## 2017-12-18 ENCOUNTER — Ambulatory Visit: Payer: Medicare Other | Admitting: Nurse Practitioner

## 2017-12-20 ENCOUNTER — Ambulatory Visit: Payer: Medicare Other | Admitting: Nurse Practitioner

## 2017-12-24 ENCOUNTER — Ambulatory Visit
Admission: RE | Admit: 2017-12-24 | Discharge: 2017-12-24 | Disposition: A | Discharge status: Home / Self Care | Payer: Medicare Other | Source: Ambulatory Visit | Attending: Nurse Practitioner | Admitting: Nurse Practitioner

## 2017-12-24 ENCOUNTER — Ambulatory Visit (INDEPENDENT_AMBULATORY_CARE_PROVIDER_SITE_OTHER): Payer: Medicare Other | Admitting: Nurse Practitioner

## 2017-12-24 ENCOUNTER — Encounter: Payer: Self-pay | Admitting: Nurse Practitioner

## 2017-12-24 VITALS — BP 125/67 | HR 79 | Temp 99.1°F | Ht 62.0 in | Wt 185.2 lb

## 2017-12-24 DIAGNOSIS — R300 Dysuria: Secondary | ICD-10-CM | POA: Diagnosis not present

## 2017-12-24 DIAGNOSIS — R05 Cough: Secondary | ICD-10-CM | POA: Diagnosis not present

## 2017-12-24 DIAGNOSIS — N3001 Acute cystitis with hematuria: Secondary | ICD-10-CM

## 2017-12-24 DIAGNOSIS — R918 Other nonspecific abnormal finding of lung field: Secondary | ICD-10-CM | POA: Insufficient documentation

## 2017-12-24 DIAGNOSIS — E1165 Type 2 diabetes mellitus with hyperglycemia: Secondary | ICD-10-CM

## 2017-12-24 DIAGNOSIS — R509 Fever, unspecified: Secondary | ICD-10-CM | POA: Diagnosis not present

## 2017-12-24 DIAGNOSIS — B373 Candidiasis of vulva and vagina: Secondary | ICD-10-CM | POA: Diagnosis not present

## 2017-12-24 DIAGNOSIS — R058 Other specified cough: Secondary | ICD-10-CM

## 2017-12-24 DIAGNOSIS — Z794 Long term (current) use of insulin: Secondary | ICD-10-CM

## 2017-12-24 DIAGNOSIS — B3731 Acute candidiasis of vulva and vagina: Secondary | ICD-10-CM

## 2017-12-24 LAB — POCT URINALYSIS DIPSTICK
Bilirubin, UA: NEGATIVE
Glucose, UA: NEGATIVE
Ketones, UA: NEGATIVE
Nitrite, UA: NEGATIVE
Protein, UA: NEGATIVE
Spec Grav, UA: <=1.005 — AB (ref 1.010–1.025)
Urobilinogen, UA: 2 E.U./dL — AB
pH, UA: 8.5 — AB (ref 5.0–8.0)

## 2017-12-24 LAB — GLUCOSE, POCT (MANUAL RESULT ENTRY): POC Glucose: 164 mg/dl — AB (ref 70–99)

## 2017-12-24 MED ORDER — CEPHALEXIN 500 MG PO CAPS: 500.0000 mg | ORAL_CAPSULE | Freq: Three times a day (TID) | ORAL | 0 refills | Status: AC

## 2017-12-24 MED ORDER — FLUCONAZOLE 150 MG PO TABS: 150.0000 mg | ORAL_TABLET | Freq: Once | ORAL | 0 refills | Status: AC

## 2017-12-24 NOTE — Progress Notes (Signed)
Subjective:    Patient ID: Vanessa Baker, female    DOB: 1975/02/13, 42 y.o.   MRN: [id number]  Vanessa Baker is a 42 y.o. female presenting on 12/24/2017 for URI (possible flu: bodyaches, cough, productive cough, and fever 102. and  hoarseness x 2 weeks) and Dysuria (burning, urinary pressure and frequency x 2 weeks)  HPI URI Patient presents with 2 week history of body aches, cough (productive), hoarseness, and fever up to 102.  Symptoms continue to worsen.  Wheezing, shortness of breath also present. - She has had some nausea, no vomiting.  Has also had alternating diarrhea, constipation. - Has had no other sick contacts. Her husband is becoming sick after having had exposure to patient.  Dysuria Urinary symptoms may have been longer than 2 weeks for onset.  Symptoms include are pelvic pressure, dysuria, pain in R groin, urinary frequency, urinary urgency.  T2DM Has not been checking sugars while feeling sick.  Is still taking medicines appropriately. Is taking levemir 8 units at bedtime and trulicity on Mondays.  Ate last about 2 hours ago for lunch around 12:30.    POC Glucose  Date Value Ref Range Status  12/24/2017 164 (A) 70 - 99 mg/dl Final    Social History   Tobacco Use  . Smoking status: Current Every Day Smoker    Packs/day: 0.50    Years: 27.00    Pack years: 13.50    Types: Cigarettes  . Smokeless tobacco: Never Used  Substance Use Topics  . Alcohol use: Not Currently  . Drug use: No    Review of Systems Per HPI unless specifically indicated above     Objective:    BP 125/67 (BP Location: Right Arm, Patient Position: Sitting, Cuff Size: Normal)   Pulse 79   Temp 99.1 F (37.3 C) (Oral)   Ht 5\' 2"  (1.575 m)   Wt 185 lb 3.2 oz (84 kg)   SpO2 98%   BMI 33.87 kg/m   Wt Readings from Last 3 Encounters:  12/24/17 185 lb 3.2 oz (84 kg)  11/19/17 189 lb 3.2 oz (85.8 kg)  11/19/17 189 lb 3.2 oz (85.8 kg)    Physical Exam    Constitutional: She is oriented to person, place, and time. She appears well-developed and well-nourished. She appears ill. She appears distressed (mild).  HENT:  Head: Normocephalic and atraumatic.  Cardiovascular: Normal rate, regular rhythm, S1 normal, S2 normal, normal heart sounds and intact distal pulses.  Pulmonary/Chest: Effort normal and breath sounds normal. Tachypnea noted. No respiratory distress. She has no wheezes. She has no rhonchi. She has no rales.  Abdominal: There is no hepatosplenomegaly. There is tenderness in the right upper quadrant, right lower quadrant and suprapubic area. There is guarding. There is no rigidity, no rebound, no CVA tenderness, no tenderness at McBurney's point and negative Murphy's sign. No hernia.  Lymphadenopathy:    She has cervical adenopathy.       Left cervical: Deep cervical adenopathy present.       Right: Inguinal adenopathy present.  Neurological: She is alert and oriented to person, place, and time.  Skin: Skin is warm and dry. Capillary refill takes less than 2 seconds.  Psychiatric: She has a normal mood and affect. Her behavior is normal. Judgment and thought content normal.  Vitals reviewed.   Results for orders placed or performed in visit on 11/19/17  COMPLETE METABOLIC PANEL WITH GFR  Result Value Ref Range   Glucose, Bld 94 65 -  99 mg/dL   BUN 10 7 - 25 mg/dL   Creat 0.58 0.50 - 1.10 mg/dL   GFR, Est Non African American 113 > OR = 60 mL/min/1.79m2   GFR, Est African American 131 > OR = 60 mL/min/1.27m2   BUN/Creatinine Ratio NOT APPLICABLE 6 - 22 (calc)   Sodium 136 135 - 146 mmol/L   Potassium 4.7 3.5 - 5.3 mmol/L   Chloride 97 (L) 98 - 110 mmol/L   CO2 26 20 - 32 mmol/L   Calcium 9.1 8.6 - 10.2 mg/dL   Total Protein 6.0 (L) 6.1 - 8.1 g/dL   Albumin 3.7 3.6 - 5.1 g/dL   Globulin 2.3 1.9 - 3.7 g/dL (calc)   AG Ratio 1.6 1.0 - 2.5 (calc)   Total Bilirubin 0.2 0.2 - 1.2 mg/dL   Alkaline phosphatase (APISO) 46 33 - 115  U/L   AST 11 10 - 30 U/L   ALT 7 6 - 29 U/L  Hemoglobin A1c  Result Value Ref Range   Hgb A1c MFr Bld 6.1 (H) <5.7 % of total Hgb   Mean Plasma Glucose 128 (calc)   eAG (mmol/L) 7.1 (calc)  Lipid panel  Result Value Ref Range   Cholesterol 153 <200 mg/dL   HDL 29 (L) >50 mg/dL   Triglycerides 356 (H) <150 mg/dL   LDL Cholesterol (Calc) 81 mg/dL (calc)   Total CHOL/HDL Ratio 5.3 (H) <5.0 (calc)   Non-HDL Cholesterol (Calc) 124 <130 mg/dL (calc)  TSH  Result Value Ref Range   TSH 2.08 mIU/L  Iron, TIBC and Ferritin Panel  Result Value Ref Range   Iron 73 40 - 190 mcg/dL   TIBC 468 (H) 250 - 450 mcg/dL (calc)   %SAT 16 16 - 45 % (calc)   Ferritin 40 16 - 232 ng/mL  hCG, serum, qualitative  Result Value Ref Range   Preg, Serum NEGATIVE       Assessment & Plan:   Problem List Items Addressed This Visit      Endocrine   Type 2 diabetes mellitus with hyperglycemia, with long-term current use of insulin (HCC)   Relevant Orders   POCT glucose (manual entry) (Completed)    Other Visit Diagnoses    Chills with fever    -  Primary   Relevant Orders   DG Chest 2 View   COMPLETE METABOLIC PANEL WITH GFR   CBC with Differential/Platelet   POCT glucose (manual entry) (Completed)   Dysuria       Relevant Medications   cephALEXin (KEFLEX) 500 MG capsule   Other Relevant Orders   POCT Urinalysis Dipstick (Completed)   Acute cystitis with hematuria       Relevant Medications   cephALEXin (KEFLEX) 500 MG capsule   Other Relevant Orders   COMPLETE METABOLIC PANEL WITH GFR   CBC with Differential/Platelet   Vaginal candidiasis       Relevant Medications   cephALEXin (KEFLEX) 500 MG capsule   fluconazole (DIFLUCAN) 150 MG tablet   Productive cough       Relevant Orders   DG Chest 2 View   COMPLETE METABOLIC PANEL WITH GFR   CBC with Differential/Platelet      Presentation consistent with acute infection, likely URI with bronchitis and UTI. Acute cystitis with hematuria.   Pt symptomatic currently with increased suprapubic pressure x 10 days. Currently with systemic signs or symptoms of infection.   - No current risk of concurrent STI. - Presentation concerning for [place], but  POC glucose normal today.  Plan: 1. START Keflex 500mg  3 times daily for next 5 days.   - Send Urine culture  -Patient usually has vaginal candidiasis with antibiotic use.  Send Diflucan 150 mg tablet x1 for 1 dose. 2. Provided non-pharm measures for UTI prevention for good hygiene. 3. Drink plenty of fluids and improve hydration over next 1 week. 4. Provided precautions for severe symptoms requiring ED visit to include no urine in 24-48 hours. Recommended patient to seek care if any worsening as she has increased risk to have sepsis due to length of symptoms and presentation. 5. Followup 2-5 days as needed for worsening or persistent symptoms.    Meds ordered this encounter  Medications  . cephALEXin (KEFLEX) 500 MG capsule    Sig: Take 1 capsule (500 mg total) by mouth 3 (three) times daily for 5 days.    Dispense:  15 capsule    Refill:  0    Order Specific Question:   Supervising Provider    Answer:   Olin Hauser [[id number]]  . fluconazole (DIFLUCAN) 150 MG tablet    Sig: Take 1 tablet (150 mg total) by mouth once for 1 dose.    Dispense:  1 tablet    Refill:  0    Order Specific Question:   Supervising Provider    Answer:   Olin Hauser [[id number]]   Follow up plan: Return in 5-7 days if symptoms worsen or fail to improve.  Cassell Smiles, DNP, AGPCNP-BC Adult Gerontology Primary Care Nurse Practitioner [facility] 12/24/2017, 2:00 PM

## 2017-12-24 NOTE — Patient Instructions (Addendum)
Vanessa Baker,   Thank you for coming in to clinic today.  1. You have a urinary tract infection, most likely.  You also have signs of systemic infection. - START Keflex 500mg  3 times daily for next 5 days.   - Drink plenty of fluids and improve hydration over next 1 week. - Provided precautions for severe symptoms requiring ED visit to include no urine in 24-48 hours, high fever over 102 that does not respond to Tylenol, feel dizzy.  2. You may also have late signs of flu - no medicines are available at this point. - Recommend good hand washing. - If congestion is worse, start OTC Mucinex (or may try Mucinex-DM for cough) up to 7-10 days then stop - Drink plenty of fluids to improve congestion - You may try over the counter Nasal Saline spray (Simply Saline, Ocean Spray) as needed to reduce congestion. - Drink warm herbal tea with honey for sore throat. - Start taking Tylenol extra strength 1 to 2 tablets every 6-8 hours for aches or fever/chills for next few days as needed.  Do not take more than 3,000 mg in 24 hours from all medicines.  May take Ibuprofen as well if tolerated 200-400mg  every 8 hours as needed.  If symptoms significantly worsening with persistent fevers/chills despite tylenol/ibpurofen, nausea, vomiting unable to tolerate food/fluids or medicine, body aches, or shortness of breath, sinus pain pressure or worsening productive cough, then follow-up for re-evaluation, may seek more immediate care at Urgent Care or ED if more concerned for emergency.  Please schedule a follow-up appointment with Cassell Smiles, AGNP. Return in 5-7 days if symptoms worsen or fail to improve.  If you have any other questions or concerns, please feel free to call the clinic or send a message through [place]. You may also schedule an earlier appointment if necessary.  You will receive a survey after today's visit either digitally by e-mail or paper by [employer]. Your experiences and  feedback matter to [place].  Please respond so we know how we are doing as we provide care for you.   Cassell Smiles, DNP, AGNP-BC Adult Gerontology Nurse Practitioner [place]

## 2017-12-25 ENCOUNTER — Other Ambulatory Visit: Payer: Self-pay | Admitting: Psychiatry

## 2017-12-25 DIAGNOSIS — F3130 Bipolar disorder, current episode depressed, mild or moderate severity, unspecified: Secondary | ICD-10-CM

## 2017-12-25 DIAGNOSIS — F4310 Post-traumatic stress disorder, unspecified: Secondary | ICD-10-CM

## 2017-12-26 LAB — URINE CULTURE
MICRO NUMBER:: 91392674
SPECIMEN QUALITY:: ADEQUATE

## 2017-12-27 ENCOUNTER — Encounter: Payer: Self-pay | Admitting: Nurse Practitioner

## 2018-01-01 ENCOUNTER — Other Ambulatory Visit: Payer: Self-pay

## 2018-01-01 ENCOUNTER — Encounter: Payer: Self-pay | Admitting: Psychiatry

## 2018-01-01 ENCOUNTER — Ambulatory Visit (INDEPENDENT_AMBULATORY_CARE_PROVIDER_SITE_OTHER): Payer: Medicare Other | Admitting: Psychiatry

## 2018-01-01 VITALS — BP 155/88 | HR 102 | Temp 98.4°F | Wt 185.8 lb

## 2018-01-01 DIAGNOSIS — G2119 Other drug induced secondary parkinsonism: Secondary | ICD-10-CM

## 2018-01-01 DIAGNOSIS — F1211 Cannabis abuse, in remission: Secondary | ICD-10-CM

## 2018-01-01 DIAGNOSIS — F1010 Alcohol abuse, uncomplicated: Secondary | ICD-10-CM

## 2018-01-01 DIAGNOSIS — F1411 Cocaine abuse, in remission: Secondary | ICD-10-CM | POA: Diagnosis not present

## 2018-01-01 DIAGNOSIS — F17200 Nicotine dependence, unspecified, uncomplicated: Secondary | ICD-10-CM

## 2018-01-01 DIAGNOSIS — G2581 Restless legs syndrome: Secondary | ICD-10-CM

## 2018-01-01 DIAGNOSIS — F3130 Bipolar disorder, current episode depressed, mild or moderate severity, unspecified: Secondary | ICD-10-CM | POA: Diagnosis not present

## 2018-01-01 DIAGNOSIS — F4310 Post-traumatic stress disorder, unspecified: Secondary | ICD-10-CM

## 2018-01-01 MED ORDER — TRAZODONE HCL 50 MG PO TABS: 25.0000 mg | ORAL_TABLET | Freq: Every evening | ORAL | 1 refills | Status: DC | PRN

## 2018-01-01 MED ORDER — SERTRALINE HCL 100 MG PO TABS: 100.0000 mg | ORAL_TABLET | Freq: Every day | ORAL | 1 refills | Status: DC

## 2018-01-01 MED ORDER — GABAPENTIN 300 MG PO CAPS: 300.0000 mg | ORAL_CAPSULE | Freq: Every day | ORAL | 1 refills | Status: DC

## 2018-01-01 MED ORDER — OLANZAPINE 10 MG PO TABS: 10.0000 mg | ORAL_TABLET | Freq: Every day | ORAL | 1 refills | Status: DC

## 2018-01-01 MED ORDER — TRIHEXYPHENIDYL HCL 5 MG PO TABS: 5.0000 mg | ORAL_TABLET | Freq: Three times a day (TID) | ORAL | 1 refills | Status: DC

## 2018-01-01 MED ORDER — DIVALPROEX SODIUM 500 MG PO DR TAB: DELAYED_RELEASE_TABLET | ORAL | 1 refills | Status: DC

## 2018-01-01 NOTE — Progress Notes (Signed)
[facility] MD OP Progress Note  01/01/2018 2:22 PM Vanessa Baker  MRN:  [id number]  Chief Complaint: ' I am here for follow up.' Chief Complaint    Follow-up; Medication Refill     HPI: Vanessa Baker is a 42 year old Caucasian female, divorced, lives in [place], has a history of bipolar disorder, PTSD, drug-induced Parkinson's disease, alcohol use disorder, cocaine use disorder, cannabis use disorder, tobacco use disorder, hyperlipidemia, recent diagnosis of diabetes mellitus, seizure disorder, IBS, presented to the clinic today for a follow-up visit.  Patient today reports she is currently recovering from bronchitis and flu.  Patient reports she hence has hoarseness of her voice.  She is noted as whispering today in response to questions asked .  Patient reports she is currently compliant with her medications as prescribed.  She denies any significant mood lability or anxiety symptoms.  Patient reports sleep is good.  She reports she started back on the gabapentin again for the restless leg syndrome.  She had called writer previously since she was worried about sleepwalking due to gabapentin.  She however reports she does have a history of sleepwalking since her childhood and she does not think the gabapentin could have contributed to the same.  She is currently back on it and is tolerating it well.  Patient denies any auditory hallucinations.  She does report seeing shadows on and off from the corner of her eyes.  She however reports they are not significant and she is able to cope with it since they happen very rarely.  Patient reports she has been trying to quit smoking and has been cutting down.  She continues to stay away from other illicit substances.  She denies any suicidality.  She denies any homicidality.   Visit Diagnosis:    ICD-10-CM   1. Bipolar I disorder, most recent episode depressed (Vanessa Baker) F31.30 traZODone (DESYREL) 50 MG tablet    OLANZapine (ZYPREXA) 10 MG tablet   divalproex (DEPAKOTE) 500 MG DR tablet   in early remission  2. PTSD (post-traumatic stress disorder) F43.10 traZODone (DESYREL) 50 MG tablet    sertraline (ZOLOFT) 100 MG tablet   improving  3. Cocaine use disorder, mild, in early remission (Vanessa Baker) F14.11   4. Alcohol use disorder, mild, abuse F10.10   5. Drug-induced Parkinson's disease (Vanessa Baker) G21.19 trihexyphenidyl (ARTANE) 5 MG tablet  6. Cannabis use disorder, mild, in early remission F12.11   7. Restless leg syndrome G25.81   8. Tobacco use disorder F17.200     Past Psychiatric History: Reviewed past psychiatric history from my progress note on 04/02/2017.  Past trials of Seroquel, Latuda, Ambien, clonazepam, Topamax, Lunesta, gabapentin, Trintellix, Abilify,rexulti  Past Medical History:  Past Medical History:  Diagnosis Date  . Allergy   . Anxiety   . Asthma   . Bipolar disorder (Vanessa Baker)   . Bursitis   . Depression   . Diabetes mellitus (Vanessa Baker) 09/2017  . Diabetes mellitus, type II (Vanessa Baker)   . Essential hypertension 03/28/2017  . Frequent headaches   . GERD (gastroesophageal reflux disease)   . Headache   . High cholesterol   . Hypertension   . Irritable bowel syndrome (IBS)   . Irritable colon 02/23/2010  . Migraine 04/02/2017  . Obesity (BMI 30-39.9) 11/25/2013  . Seizure (Vanessa Baker) [phone]  . Seizures (Vanessa Baker)   . Tremor     Past Surgical History:  Procedure Laterality Date  . NO PAST SURGERIES      Family Psychiatric History: Have reviewed family psychiatric history from  my progress note on 04/02/2017  Family History:  Family History  Problem Relation Age of Onset  . Bipolar disorder Mother   . Schizophrenia Mother   . Hypertension Mother   . Diabetes Mother   . Cancer Mother   . Anxiety disorder Mother   . ADD / ADHD Mother   . Alcohol abuse Mother   . Drug abuse Mother   . Bipolar disorder Sister   . Anxiety disorder Sister   . Drug abuse Sister   . Bipolar disorder Brother   . Anxiety disorder Brother   . Drug  abuse Brother   . Hypertension Father   . Cancer Father   . Breast cancer Maternal Aunt   . Breast cancer Paternal Aunt   . Breast cancer Maternal Grandmother   . Alcohol abuse Maternal Grandmother   . Breast cancer Paternal Grandmother   . Parkinson's disease Maternal Grandfather   . Alcohol abuse Maternal Grandfather   . Alcohol abuse Maternal Uncle   . ADD / ADHD Son   . Tremor Neg Hx     Social History: I have reviewed social history from my progress note on 04/02/2017 Social History   Socioeconomic History  . Marital status: Married    Spouse name: Vanessa Baker   . Number of children: 1  . Years of education: 31  . Highest education level: Associate degree: occupational, Hotel manager, or vocational program  Occupational History  . Occupation: disability  Social Needs  . Financial resource strain: Very hard  . Food insecurity:    Worry: Often true    Inability: Often true  . Transportation needs:    Medical: No    Non-medical: No  Tobacco Use  . Smoking status: Current Every Day Smoker    Packs/day: 0.50    Years: 27.00    Pack years: 13.50    Types: Cigarettes  . Smokeless tobacco: Never Used  Substance and Sexual Activity  . Alcohol use: Not Currently  . Drug use: No  . Sexual activity: Yes    Partners: Male    Birth control/protection: None  Lifestyle  . Physical activity:    Days per week: 0 days    Minutes per session: 0 min  . Stress: Very much  Relationships  . Social connections:    Talks on phone: Never    Gets together: Never    Attends religious service: Never    Active member of club or organization: No    Attends meetings of clubs or organizations: Never    Relationship status: Married  Other Topics Concern  . Not on file  Social History Narrative   Lives with husband, Vanessa Baker (on [place])   Right-handed   Caffeine use: 1 cup coffee a day, 2 soft drinks per day    Allergies:  Allergies  Allergen Reactions  . Amitriptyline Other (See  Comments)    Confusion, paralysis    Metabolic Disorder Labs: Lab Results  Component Value Date   HGBA1C 6.1 (H) 11/19/2017   MPG 128 11/19/2017   MPG 137 08/13/2017   Lab Results  Component Value Date   PROLACTIN 4.6 (L) 04/22/2017   PROLACTIN 10.2 11/10/2015   Lab Results  Component Value Date   CHOL 153 11/19/2017   TRIG 356 (H) 11/19/2017   HDL 29 (L) 11/19/2017   CHOLHDL 5.3 (H) 11/19/2017   LDLCALC 81 11/19/2017   LDLCALC 78 03/07/2017   Lab Results  Component Value Date   TSH 2.08 11/19/2017  TSH 2.130 10/25/2016    Therapeutic Level Labs: No results found for: LITHIUM Lab Results  Component Value Date   VALPROATE 61.4 08/13/2017   VALPROATE <10 (L) 04/22/2017   No components found for:  CBMZ  Current Medications: Current Outpatient Medications  Medication Sig Dispense Refill  . ACCU-CHEK SOFTCLIX LANCETS lancets by Other route. Use as instructed    . acetaminophen (TYLENOL) 500 MG tablet Take 500 mg by mouth every 6 (six) hours as needed.    Marland Kitchen albuterol (PROAIR HFA) 108 (90 Base) MCG/ACT inhaler Inhale 2 puffs into the lungs every 4 (four) hours as needed for wheezing or shortness of breath. 8.5 g 5  . atorvastatin (LIPITOR) 20 MG tablet TAKE 1 TABLET BY MOUTH ONCE DAILY 90 tablet 0  . baclofen (LIORESAL) 10 MG tablet Take 1 tablet (10 mg total) by mouth 3 (three) times daily. 60 each 2  . blood glucose meter kit and supplies Dispense based on patient and insurance preference. Use up to four times daily as directed. (FOR ICD-10 E10.9, E11.9). 1 each 0  . budesonide-formoterol (SYMBICORT) 80-4.5 MCG/ACT inhaler Inhale 2 puffs into the lungs 2 (two) times daily. 1 Inhaler 3  . Calcium Carb-Cholecalciferol (CALCIUM 600+D3 PO) Take 1 tablet by mouth daily.    . divalproex (DEPAKOTE) 500 MG DR tablet TAKE 1 TABLET BY MOUTH IN THE MORNING AND 2 AT BEDTIME 270 tablet 1  . Dulaglutide (TRULICITY) 7.59 FM/3.8GY SOPN Inject 0.75 mg into the skin once a week. 4 pen 5   . esomeprazole (NEXIUM) 20 MG capsule Take 1 capsule (20 mg total) by mouth daily at 12 noon. 30 capsule 5  . fluconazole (DIFLUCAN) 150 MG tablet TAKE ONE TABLET BY MOUTH AS A ONE TIME DOSE  0  . gabapentin (NEURONTIN) 300 MG capsule Take 1 capsule (300 mg total) by mouth at bedtime. For restless legs 90 capsule 1  . glucose blood (ACCU-CHEK AVIVA PLUS) test strip USE 1 STRIP TO CHECK GLUCOSE 4 TIMES DAILY AS DIRECTED. 400 each 3  . guaiFENesin (ROBITUSSIN) 100 MG/5ML SOLN Take 5 mLs by mouth every 4 (four) hours as needed for cough or to loosen phlegm.    Marland Kitchen ibuprofen (ADVIL,MOTRIN) 200 MG tablet Take 400-800 mg by mouth every 6 (six) hours as needed (for pain or headaches).    . Insulin Detemir (LEVEMIR) 100 UNIT/ML Pen Inject 5 Units into the skin at bedtime. (Patient taking differently: Inject 8 Units into the skin at bedtime. ) 15 mL 11  . insulin lispro (HUMALOG) 100 UNIT/ML KiwkPen Administer at meals 1 unit per 50 over CBG of 150 up to 6 units (see chart). 15 mL 11  . Insulin Pen Needle (CARETOUCH PEN NEEDLES) 32G X 5 MM MISC 1 Device by Does not apply route 4 (four) times daily. 120 each 11  . lisinopril (PRINIVIL,ZESTRIL) 10 MG tablet Take 1 tablet (10 mg total) by mouth daily. 90 tablet 1  . metFORMIN (GLUCOPHAGE) 500 MG tablet Take 1 tablet (500 mg total) by mouth 2 (two) times daily with a meal. 180 tablet 3  . norethindrone-ethinyl estradiol (JUNEL FE,GILDESS FE,LOESTRIN FE) 1-20 MG-MCG tablet Take 1 tablet by mouth daily. 1 Package 11  . OLANZapine (ZYPREXA) 10 MG tablet Take 1 tablet (10 mg total) by mouth at bedtime. 90 tablet 1  . omega-3 acid ethyl esters (LOVAZA) 1 g capsule TAKE 1 CAPSULE BY MOUTH TWICE DAILY 180 capsule 1  . primidone (MYSOLINE) 50 MG tablet Take 1 tablet (50  mg total) by mouth at bedtime. 90 tablet 1  . sertraline (ZOLOFT) 100 MG tablet Take 1 tablet (100 mg total) by mouth daily. 90 tablet 1  . traZODone (DESYREL) 50 MG tablet Take 0.5-1 tablets (25-50 mg  total) by mouth at bedtime as needed for sleep. 90 tablet 1  . trihexyphenidyl (ARTANE) 5 MG tablet Take 1 tablet (5 mg total) by mouth 3 (three) times daily with meals. 270 tablet 1   No current facility-administered medications for this visit.      Musculoskeletal: Strength & Muscle Tone: within normal limits Gait & Station: normal Patient leans: N/A  Psychiatric Specialty Exam: Review of Systems  Constitutional: Positive for malaise/fatigue.  Respiratory: Positive for cough.   Neurological: Positive for tremors (mild - chronic , improved).  Psychiatric/Behavioral: Positive for depression and hallucinations. The patient is nervous/anxious.   All other systems reviewed and are negative.   Blood pressure (!) 155/88, pulse (!) 102, temperature 98.4 F (36.9 C), temperature source Oral, weight 185 lb 12.8 oz (84.3 kg), last menstrual period 12/12/2017.Body mass index is 33.98 kg/m.  General Appearance: Casual  Eye Contact:  Fair  Speech:  Normal Rate  Volume:  talks in whispers  Mood:  Dysphoric reports she feels that way due to the bronchitis  Affect:  Congruent  Thought Process:  Goal Directed and Descriptions of Associations: Intact  Orientation:  Full (Time, Place, and Person)  Thought Content: Hallucinations: Visual shadows on and off - rare  Suicidal Thoughts:  No  Homicidal Thoughts:  No  Memory:  Immediate;   Fair Recent;   Fair Remote;   Fair  Judgement:  Fair  Insight:  Fair  Psychomotor Activity:  Normal  Concentration:  Concentration: Fair and Attention Span: Fair  Recall:  [employer] of Knowledge: Fair  Language: Fair  Akathisia:  No  Handed:  Right  AIMS (if indicated): 1, has chronic tremors   Assets:  Communication Skills Desire for Improvement Social Support  ADL's:  Intact  Cognition: WNL  Sleep:  Fair   Screenings: AIMS     Office Visit from 01/01/2018 in [facility] Total Score  1    GAD-7     Office  Visit from 09/06/2017 in [facility]  Total GAD-7 Score  8    PHQ2-9     Clinical Support from 11/19/2017 in [facility] Office Visit from 09/06/2017 in [facility] Office Visit from 03/28/2017 in [facility] Office Visit from 02/28/2017 in [facility]  PHQ-2 Total Score  _0 PHQ-9 Total Score  _1 Assessment and Plan: Vonita is a 42 year old Caucasian female who has a history of bipolar disorder, PTSD, panic disorder, drug-induced Parkinsonism, seizure disorder, recent diagnosis of diabetes mellitus, polysubstance abuse, presented to the clinic today for a follow-up visit.  Patient is biologically predisposed given her history of trauma as well as multiple medical problems and mental health history in her family.  Patient also has a history of abusing substances in the past and continues to have some binging on and off on alcohol.  Patient is currently recovering from bronchitis.  Patient reports she is doing well with regards to her medications and wants to be continued on the same.  Will continue plan as noted below.  Plan For bipolar disorder Depakote 500 mg 3  times a day Depakote level on 08/13/2017-61.4-therapeutic Zyprexa 10 mg p.o. nightly Zoloft 100 mg p.o. daily  For panic attacks Zoloft 100 mg p.o. Daily.  PTSD Zoloft 100 mg p.o. daily Refer for CBT  For insomnia Trazodone 25-50 mg p.o. nightly  For restless leg syndrome Gabapentin 300 mg p.o. nightly.  For drug-induced Parkinson's disease Artane 5 mg p.o. 3 times daily.  For alcohol use disorder/cocaine and cannabis use disorder Continue to provide substance abuse counseling. She reports she has been staying away from cocaine and cannabis-under remission Does have a history of binging on alcohol on and off.  For tobacco use disorder She is trying to cut down.  Follow-up in clinic in 4-5 months or sooner if needed.   Discussed with patient to reach out sooner if she has any mood symptoms.  Patient does have financial problems and hence is not able to keep up with more frequent appointments.  More than 50 % of the time was spent for psychoeducation and supportive psychotherapy and care coordination.  This note was generated in part or whole with voice recognition software. Voice recognition is usually quite accurate but there are transcription errors that can and very often do occur. I apologize for any typographical errors that were not detected and corrected.        Ursula Alert, MD 01/01/2018, 2:22 PM

## 2018-01-06 ENCOUNTER — Ambulatory Visit: Payer: Self-pay | Admitting: Neurology

## 2018-01-06 ENCOUNTER — Telehealth: Payer: Self-pay | Admitting: *Deleted

## 2018-01-06 NOTE — Telephone Encounter (Signed)
Pt no showed f/u appt on 01/06/2018 @ 3:00 pm.

## 2018-01-07 ENCOUNTER — Encounter: Payer: Self-pay | Admitting: Neurology

## 2018-01-12 ENCOUNTER — Encounter: Payer: Self-pay | Admitting: Nurse Practitioner

## 2018-01-16 ENCOUNTER — Encounter: Payer: Self-pay | Admitting: Nurse Practitioner

## 2018-01-16 DIAGNOSIS — J40 Bronchitis, not specified as acute or chronic: Secondary | ICD-10-CM

## 2018-01-16 MED ORDER — PREDNISONE 50 MG PO TABS: 50.0000 mg | ORAL_TABLET | Freq: Every day | ORAL | 0 refills | Status: AC

## 2018-01-16 NOTE — Telephone Encounter (Signed)
Mychart message

## 2018-01-20 ENCOUNTER — Other Ambulatory Visit: Payer: Self-pay

## 2018-01-20 ENCOUNTER — Encounter: Payer: Self-pay | Admitting: Nurse Practitioner

## 2018-01-20 ENCOUNTER — Ambulatory Visit (INDEPENDENT_AMBULATORY_CARE_PROVIDER_SITE_OTHER): Payer: Medicare Other | Admitting: Nurse Practitioner

## 2018-01-20 ENCOUNTER — Ambulatory Visit
Admission: RE | Admit: 2018-01-20 | Discharge: 2018-01-20 | Disposition: A | Discharge status: Home / Self Care | Payer: Medicare Other | Source: Ambulatory Visit | Attending: Nurse Practitioner | Admitting: Nurse Practitioner

## 2018-01-20 VITALS — BP 124/63 | HR 90 | Temp 98.6°F | Resp 20 | Ht 62.0 in | Wt 184.6 lb

## 2018-01-20 DIAGNOSIS — R05 Cough: Secondary | ICD-10-CM

## 2018-01-20 DIAGNOSIS — J189 Pneumonia, unspecified organism: Secondary | ICD-10-CM

## 2018-01-20 DIAGNOSIS — R058 Other specified cough: Secondary | ICD-10-CM

## 2018-01-20 DIAGNOSIS — J181 Lobar pneumonia, unspecified organism: Secondary | ICD-10-CM

## 2018-01-20 MED ORDER — DOXYCYCLINE HYCLATE 100 MG PO TABS: 100.0000 mg | ORAL_TABLET | Freq: Two times a day (BID) | ORAL | 0 refills | Status: DC

## 2018-01-20 MED ORDER — CEFTRIAXONE SODIUM 1 G IJ SOLR
1.0000 g | Freq: Once | INTRAMUSCULAR | Status: AC
  Administered 2018-01-20: 1 g via INTRAMUSCULAR

## 2018-01-20 NOTE — Telephone Encounter (Signed)
Mychart message

## 2018-01-20 NOTE — Progress Notes (Signed)
Subjective:    Patient ID: Vanessa Baker, female    DOB: January 09, 1976, 42 y.o.   MRN: [id number]  Vanessa Baker is a 42 y.o. female presenting on 01/20/2018 for Cough (coughing, productive mucus w/ greenish tint, lower back pain, icoughing worse at bedtime, and intermittent headace x 1 mth)  HPI Productive Cough with URI Patient has had symptoms for over 1 month and now is starting to have some lower back pain with coughing. - Hot and cold flashes.  No measured fevers, however. Headaches are usually associated with maxillary sinus pressure.  - Some shortness of breath with symptoms as well, but denies chest tightness. - Some ear fullness/muffled sounds, but none now. - No tooth/jaw pain.  Patient also presents her CBG logs - 1 month ago, had great readings at goal.  Since sick, has had increased CBG at least 2-3 times per week > 200.    Social History   Tobacco Use  . Smoking status: Current Every Day Smoker    Packs/day: 0.50    Years: 27.00    Pack years: 13.50    Types: Cigarettes  . Smokeless tobacco: Never Used  Substance Use Topics  . Alcohol use: Not Currently  . Drug use: No    Review of Systems Per HPI unless specifically indicated above     Objective:    BP 124/63 (BP Location: Right Arm, Patient Position: Sitting, Cuff Size: Normal)   Pulse 90   Temp 98.6 F (37 C) (Oral)   Resp 20   Ht 5\' 2"  (1.575 m)   Wt 184 lb 9.6 oz (83.7 kg)   SpO2 98%   BMI 33.76 kg/m   Wt Readings from Last 3 Encounters:  01/20/18 184 lb 9.6 oz (83.7 kg)  12/24/17 185 lb 3.2 oz (84 kg)  11/19/17 189 lb 3.2 oz (85.8 kg)    Physical Exam Vitals signs reviewed.  Constitutional:      General: She is not in acute distress.    Appearance: She is well-developed.  HENT:     Head: Normocephalic and atraumatic.     Right Ear: Hearing, tympanic membrane, ear canal and external ear normal.     Left Ear: Hearing, tympanic membrane, ear canal and external ear normal.       Nose: Nasal deformity (mucosal thinning with hypervascularization) present.     Right Sinus: Maxillary sinus tenderness and frontal sinus tenderness present.     Left Sinus: Maxillary sinus tenderness and frontal sinus tenderness present.     Mouth/Throat:     Lips: Pink.     Mouth: Mucous membranes are moist.     Dentition: Abnormal dentition.     Pharynx: Oropharynx is clear. No pharyngeal swelling, oropharyngeal exudate, posterior oropharyngeal erythema or uvula swelling.     Tonsils: No tonsillar exudate or tonsillar abscesses. Swelling: 0 on the right. 0 on the left.  Eyes:     General: Lids are normal.     Extraocular Movements: Extraocular movements intact.  Cardiovascular:     Rate and Rhythm: Normal rate and regular rhythm.     Heart sounds: Normal heart sounds, S1 normal and S2 normal.  Pulmonary:     Effort: Pulmonary effort is normal. No respiratory distress.     Breath sounds: Examination of the left-upper field reveals wheezing and rales. Examination of the left-lower field reveals wheezing and rales. Decreased breath sounds (throughout), wheezing and rales present. No rhonchi.  Skin:    General: Skin is  warm and dry.     Capillary Refill: Capillary refill takes less than 2 seconds.  Neurological:     General: No focal deficit present.     Mental Status: She is alert and oriented to person, place, and time. Mental status is at baseline.  Psychiatric:        Mood and Affect: Mood normal.        Behavior: Behavior normal.        Thought Content: Thought content normal.        Judgment: Judgment normal.    Results for orders placed or performed in visit on 12/24/17  Urine Culture  Result Value Ref Range   MICRO NUMBER: [id number]    SPECIMEN QUALITY: Adequate    Sample Source URINE    STATUS: FINAL    ISOLATE 1: Escherichia coli (A)       Susceptibility   Escherichia coli - URINE CULTURE, REFLEX    AMOX/CLAVULANIC <=2 Sensitive     AMPICILLIN <=2 Sensitive      AMPICILLIN/SULBACTAM <=2 Sensitive     CEFAZOLIN* <=4 Not Reportable      * For infections other than uncomplicated UTIcaused by E. coli, K. pneumoniae or P. mirabilis:Cefazolin is resistant if MIC > or = 8 mcg/mL.(Distinguishing susceptible versus intermediatefor isolates with MIC < or = 4 mcg/mL requiresadditional testing.)For uncomplicated UTI caused by E. coli,K. pneumoniae or P. mirabilis: Cefazolin issusceptible if MIC <32 mcg/mL and predictssusceptible to the oral agents cefaclor, cefdinir,cefpodoxime, cefprozil, cefuroxime, cephalexinand loracarbef.    CEFEPIME <=1 Sensitive     CEFTRIAXONE <=1 Sensitive     CIPROFLOXACIN <=0.25 Sensitive     LEVOFLOXACIN <=0.12 Sensitive     ERTAPENEM <=0.5 Sensitive     GENTAMICIN <=1 Sensitive     IMIPENEM <=0.25 Sensitive     NITROFURANTOIN <=16 Sensitive     PIP/TAZO <=4 Sensitive     TOBRAMYCIN <=1 Sensitive     TRIMETH/SULFA* <=20 Sensitive      * For infections other than uncomplicated UTIcaused by E. coli, K. pneumoniae or P. mirabilis:Cefazolin is resistant if MIC > or = 8 mcg/mL.(Distinguishing susceptible versus intermediatefor isolates with MIC < or = 4 mcg/mL requiresadditional testing.)For uncomplicated UTI caused by E. coli,K. pneumoniae or P. mirabilis: Cefazolin issusceptible if MIC <32 mcg/mL and predictssusceptible to the oral agents cefaclor, cefdinir,cefpodoxime, cefprozil, cefuroxime, cephalexinand loracarbef.Legend:S = Susceptible  I = IntermediateR = Resistant  NS = Not susceptible* = Not tested  NR = Not reported**NN = See antimicrobic comments  POCT Urinalysis Dipstick  Result Value Ref Range   Color, UA yellow    Clarity, UA clear    Glucose, UA Negative Negative   Bilirubin, UA negative    Ketones, UA negative    Spec Grav, UA <=1.005 (A) 1.010 - 1.025   Blood, UA trace    pH, UA 8.5 (A) 5.0 - 8.0   Protein, UA Negative Negative   Urobilinogen, UA 2.0 (A) 0.2 or 1.0 E.U./dL   Nitrite, UA negative    Leukocytes, UA  Large (3+) (A) Negative   Appearance     Odor    POCT glucose (manual entry)  Result Value Ref Range   POC Glucose 164 (A) 70 - 99 mg/dl      Assessment & Plan:   Problem List Items Addressed This Visit    None    Visit Diagnoses    Productive cough    -  Primary   Relevant Orders   DG Chest 2  View (Completed)   Community acquired pneumonia of left lower lobe of lung ([place])       Relevant Medications   cefTRIAXone (ROCEPHIN) injection 1 g (Completed)   doxycycline (VIBRA-TABS) 100 MG tablet      Patient with productive cough and symptoms consistent with CAP.  Although not confirmed on Xray, Will proceed with treatment for CAP and sinusitis. Consistent with URI and secondary sinusitis with symptoms worsening over the past 7 days and initial symptoms of nasal congestion and sinus pressure over 2 weeks ago.   Plan: 1.START taking doxycycline 100 mg tablets every 12 hours for 10 days.  Discussed completing antibiotic. - While on antibiotic, take a probiotic OTC or from food. - Continue anti-histamine loratadine 10mg  daily. - Can use Flonase 2 sprays each nostril daily for up to 4-6 weeks if no epistaxis. - Start Mucinex-DM OTC for  7-10 days prn congestion 2. Supportive care with nasal saline, warm herbal tea with honey, 3. Improve hydration 4. Tylenol / Motrin PRN fevers  5. CXR today 6. Return criteria given   Meds ordered this encounter  Medications  . cefTRIAXone (ROCEPHIN) injection 1 g  . doxycycline (VIBRA-TABS) 100 MG tablet    Sig: Take 1 tablet (100 mg total) by mouth 2 (two) times daily.    Dispense:  20 tablet    Refill:  0    Order Specific Question:   Supervising Provider    Answer:   Olin Hauser [[id number]]    Follow up plan: Return 1-2 weeks if symptoms worsen or fail to improve.  Cassell Smiles, DNP, AGPCNP-BC Adult Gerontology Primary Care Nurse Practitioner [facility] 01/20/2018, 9:52 AM

## 2018-01-20 NOTE — Patient Instructions (Signed)
Orbisonia Craw,   Thank you for coming in to clinic today.  1. You have a sinus infection.  Your shortness of breath is also concerning for possible bronchitis and pneumonia.  We will confirm with an Xray today.   - START antibiotic: doxycycline 100 mg one tablet twice daily for 10 days - You also received Rocephin today for pneumonia. - Continue anti-histamine loratadine or cetirizine 10mg  daily, also can use Flonase 2 sprays each nostril daily for up to 4-6 weeks - If congestion is worse, start OTC Mucinex (or may try Mucinex-DM for cough) up to 7-10 days then stop - Drink plenty of fluids to improve congestion - You may try over the counter Nasal Saline spray (Simply Saline, Ocean Spray) as needed to reduce congestion. - Drink warm herbal tea with honey for sore throat. - Start taking Tylenol extra strength 1 to 2 tablets every 6-8 hours for aches or fever/chills for next few days as needed.  Do not take more than 3,000 mg in 24 hours from all medicines.  May take Ibuprofen as well if tolerated 200-400mg  every 8 hours as needed.  If symptoms significantly worsening with persistent fevers/chills despite tylenol/ibpurofen, nausea, vomiting unable to tolerate food/fluids or medicine, body aches, or shortness of breath, sinus pain pressure or worsening productive cough, then follow-up for re-evaluation, may seek more immediate care at Urgent Care or ED if more concerned for emergency.  Please schedule a follow-up appointment with Cassell Smiles, AGNP. Return 1-2 weeks if symptoms worsen or fail to improve.  If you have any other questions or concerns, please feel free to call the clinic or send a message through [place]. You may also schedule an earlier appointment if necessary.  You will receive a survey after today's visit either digitally by e-mail or paper by [employer]. Your experiences and feedback matter to [place].  Please respond so we know how we are doing as we provide care for  you.   Cassell Smiles, DNP, AGNP-BC Adult Gerontology Nurse Practitioner [place]

## 2018-02-19 ENCOUNTER — Other Ambulatory Visit: Payer: Self-pay | Admitting: Nurse Practitioner

## 2018-02-19 ENCOUNTER — Encounter: Payer: Self-pay | Admitting: Nurse Practitioner

## 2018-02-19 DIAGNOSIS — E785 Hyperlipidemia, unspecified: Secondary | ICD-10-CM

## 2018-02-19 DIAGNOSIS — M62830 Muscle spasm of back: Secondary | ICD-10-CM

## 2018-02-19 DIAGNOSIS — G40909 Epilepsy, unspecified, not intractable, without status epilepticus: Secondary | ICD-10-CM

## 2018-02-19 DIAGNOSIS — E781 Pure hyperglyceridemia: Secondary | ICD-10-CM

## 2018-02-19 MED ORDER — BACLOFEN 10 MG PO TABS: 10.0000 mg | ORAL_TABLET | Freq: Three times a day (TID) | ORAL | 3 refills | Status: DC | PRN

## 2018-02-19 MED ORDER — ATORVASTATIN CALCIUM 20 MG PO TABS: 20.0000 mg | ORAL_TABLET | Freq: Every day | ORAL | 1 refills | Status: DC

## 2018-02-19 MED ORDER — OMEGA-3-ACID ETHYL ESTERS 1 G PO CAPS: 1.0000 | ORAL_CAPSULE | Freq: Two times a day (BID) | ORAL | 1 refills | Status: DC

## 2018-02-19 MED ORDER — PRIMIDONE 50 MG PO TABS: 50.0000 mg | ORAL_TABLET | Freq: Every day | ORAL | 1 refills | Status: DC

## 2018-02-19 NOTE — Telephone Encounter (Signed)
Engineer, structural

## 2018-02-21 ENCOUNTER — Ambulatory Visit (INDEPENDENT_AMBULATORY_CARE_PROVIDER_SITE_OTHER): Payer: Medicare Other | Admitting: Nurse Practitioner

## 2018-02-21 ENCOUNTER — Encounter: Payer: Self-pay | Admitting: Nurse Practitioner

## 2018-02-21 VITALS — BP 121/54 | HR 83 | Temp 98.4°F | Resp 16 | Ht 62.0 in | Wt 184.0 lb

## 2018-02-21 DIAGNOSIS — I10 Essential (primary) hypertension: Secondary | ICD-10-CM

## 2018-02-21 DIAGNOSIS — Z794 Long term (current) use of insulin: Secondary | ICD-10-CM

## 2018-02-21 DIAGNOSIS — E1165 Type 2 diabetes mellitus with hyperglycemia: Secondary | ICD-10-CM | POA: Diagnosis not present

## 2018-02-21 DIAGNOSIS — K219 Gastro-esophageal reflux disease without esophagitis: Secondary | ICD-10-CM

## 2018-02-21 DIAGNOSIS — J4541 Moderate persistent asthma with (acute) exacerbation: Secondary | ICD-10-CM

## 2018-02-21 DIAGNOSIS — J40 Bronchitis, not specified as acute or chronic: Secondary | ICD-10-CM

## 2018-02-21 DIAGNOSIS — G2581 Restless legs syndrome: Secondary | ICD-10-CM

## 2018-02-21 LAB — POCT GLYCOSYLATED HEMOGLOBIN (HGB A1C): Hemoglobin A1C: 6.2 % — AB (ref 4.0–5.6)

## 2018-02-21 MED ORDER — DULAGLUTIDE 1.5 MG/0.5ML ~~LOC~~ SOAJ: 1.5000 mg | SUBCUTANEOUS | 12 refills | Status: DC

## 2018-02-21 MED ORDER — LISINOPRIL 10 MG PO TABS: 10.0000 mg | ORAL_TABLET | Freq: Every day | ORAL | 1 refills | Status: DC

## 2018-02-21 MED ORDER — ALBUTEROL SULFATE HFA 108 (90 BASE) MCG/ACT IN AERS: INHALATION_SPRAY | RESPIRATORY_TRACT | 5 refills | Status: DC

## 2018-02-21 MED ORDER — PREDNISONE 50 MG PO TABS: 50.0000 mg | ORAL_TABLET | Freq: Every day | ORAL | 0 refills | Status: AC

## 2018-02-21 MED ORDER — ESOMEPRAZOLE MAGNESIUM 20 MG PO CPDR: 20.0000 mg | DELAYED_RELEASE_CAPSULE | Freq: Every day | ORAL | 3 refills | Status: DC

## 2018-02-21 MED ORDER — GABAPENTIN 300 MG PO CAPS: 300.0000 mg | ORAL_CAPSULE | Freq: Every day | ORAL | 1 refills | Status: DC

## 2018-02-21 MED ORDER — BUDESONIDE-FORMOTEROL FUMARATE 160-4.5 MCG/ACT IN AERO: 2.0000 | INHALATION_SPRAY | Freq: Two times a day (BID) | RESPIRATORY_TRACT | 3 refills | Status: DC
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
## 2018-02-21 NOTE — Patient Instructions (Addendum)
Vanessa Baker,   Thank you for coming in to clinic today.  1. Your provider would like to you have your annual eye exam. Please contact your current eye doctor or here are some good options for you to contact.   [facility]   Address: [address] [place], [place] [address] Phone: [phone]  Website: [email or web address] [address], [place], [place] [address] Phone: [phone] [email or web address]  [facility]  Address: [place], [place], [place] [address] Phone: [phone]   [facility] [address] [place], [place] [address] Phone: [phone]  [facility] Address: [place], [place], [place] [address]  Phone: [phone]  2. Take your prednisone 50 mg one tablet daily in morning for 5 days.  This is for your bronchitis.   3. STOP Lantus, Humalog on Monday when you take your higher dose of Tresiba.  4. INCREASE tresiba to 1.5 mg once weekly.  Continue on mondays. - Continue metformin 500 mg one tablet twice daily with food.  Please schedule a follow-up appointment with Cassell Smiles, AGNP. Return in about 3 months (around 05/23/2018).  If you have any other questions or concerns, please feel free to call the clinic or send a message through [place]. You may also schedule an earlier appointment if necessary.  You will receive a survey after today's visit either digitally by e-mail or paper by [employer]. Your experiences and feedback matter to [place].  Please respond so we know how we are doing as we provide care for you.   Cassell Smiles, DNP, AGNP-BC Adult Gerontology Nurse Practitioner [place]

## 2018-02-23 ENCOUNTER — Encounter: Payer: Self-pay | Admitting: Nurse Practitioner

## 2018-02-28 ENCOUNTER — Other Ambulatory Visit: Payer: Self-pay | Admitting: Nurse Practitioner

## 2018-02-28 ENCOUNTER — Encounter: Payer: Self-pay | Admitting: Nurse Practitioner

## 2018-02-28 DIAGNOSIS — E785 Hyperlipidemia, unspecified: Secondary | ICD-10-CM

## 2018-02-28 NOTE — Telephone Encounter (Signed)
Mychart message

## 2018-03-03 ENCOUNTER — Encounter: Payer: Self-pay | Admitting: Nurse Practitioner

## 2018-03-06 LAB — COMPLETE METABOLIC PANEL WITH GFR
AG Ratio: 1.9 (calc) (ref 1.0–2.5)
ALT: 14 U/L (ref 6–29)
AST: 15 U/L (ref 10–30)
Albumin: 4.2 g/dL (ref 3.6–5.1)
Alkaline phosphatase (APISO): 53 U/L (ref 33–115)
BUN: 18 mg/dL (ref 7–25)
CO2: 25 mmol/L (ref 20–32)
Calcium: 9.7 mg/dL (ref 8.6–10.2)
Chloride: 97 mmol/L — ABNORMAL LOW (ref 98–110)
Creat: 0.62 mg/dL (ref 0.50–1.10)
GFR, Est African American: 127 mL/min/{1.73_m2} (ref >=60)
GFR, Est Non African American: 110 mL/min/{1.73_m2} (ref >=60)
Globulin: 2.2 g/dL (calc) (ref 1.9–3.7)
Glucose, Bld: 123 mg/dL — ABNORMAL HIGH (ref 65–99)
Potassium: 3.5 mmol/L (ref 3.5–5.3)
Sodium: 136 mmol/L (ref 135–146)
Total Bilirubin: 0.2 mg/dL (ref 0.2–1.2)
Total Protein: 6.4 g/dL (ref 6.1–8.1)

## 2018-03-06 LAB — LIPID PANEL
Cholesterol: 149 mg/dL (ref <200)
HDL: 26 mg/dL — ABNORMAL LOW (ref >50)
Non-HDL Cholesterol (Calc): 123 mg/dL (calc) (ref <130)
Total CHOL/HDL Ratio: 5.7 (calc) — ABNORMAL HIGH (ref <5.0)
Triglycerides: 637 mg/dL — ABNORMAL HIGH (ref <150)

## 2018-03-07 ENCOUNTER — Other Ambulatory Visit: Payer: Self-pay | Admitting: Nurse Practitioner

## 2018-03-07 DIAGNOSIS — E785 Hyperlipidemia, unspecified: Secondary | ICD-10-CM

## 2018-03-07 MED ORDER — ATORVASTATIN CALCIUM 40 MG PO TABS: 40.0000 mg | ORAL_TABLET | Freq: Every day | ORAL | 3 refills | Status: DC

## 2018-03-12 ENCOUNTER — Ambulatory Visit (INDEPENDENT_AMBULATORY_CARE_PROVIDER_SITE_OTHER): Payer: Medicare Other | Admitting: Psychiatry

## 2018-03-12 ENCOUNTER — Encounter: Payer: Self-pay | Admitting: Psychiatry

## 2018-03-12 ENCOUNTER — Other Ambulatory Visit: Payer: Self-pay

## 2018-03-12 VITALS — BP 139/76 | HR 93 | Temp 98.8°F | Wt 182.6 lb

## 2018-03-12 DIAGNOSIS — F17200 Nicotine dependence, unspecified, uncomplicated: Secondary | ICD-10-CM

## 2018-03-12 DIAGNOSIS — F4310 Post-traumatic stress disorder, unspecified: Secondary | ICD-10-CM | POA: Diagnosis not present

## 2018-03-12 DIAGNOSIS — F1010 Alcohol abuse, uncomplicated: Secondary | ICD-10-CM

## 2018-03-12 DIAGNOSIS — F1411 Cocaine abuse, in remission: Secondary | ICD-10-CM | POA: Diagnosis not present

## 2018-03-12 DIAGNOSIS — F3130 Bipolar disorder, current episode depressed, mild or moderate severity, unspecified: Secondary | ICD-10-CM | POA: Diagnosis not present

## 2018-03-12 DIAGNOSIS — G2581 Restless legs syndrome: Secondary | ICD-10-CM

## 2018-03-12 DIAGNOSIS — G2119 Other drug induced secondary parkinsonism: Secondary | ICD-10-CM

## 2018-03-12 DIAGNOSIS — F1211 Cannabis abuse, in remission: Secondary | ICD-10-CM

## 2018-03-12 MED ORDER — TRAZODONE HCL 100 MG PO TABS: 100.0000 mg | ORAL_TABLET | Freq: Every day | ORAL | 1 refills | Status: DC

## 2018-03-12 MED ORDER — OLANZAPINE 15 MG PO TABS: 15.0000 mg | ORAL_TABLET | Freq: Every day | ORAL | 0 refills | Status: DC

## 2018-03-12 NOTE — Progress Notes (Addendum)
[facility] MD OP Progress Note  03/12/2018 2:38 PM Kailly Cherry [place]  MRN:  [id number]  Chief Complaint: ' I am here for follow up." Chief Complaint    Follow-up; Medication Refill     HPI:  Vanessa Baker is a 43 year-old Caucasian female, divorced, lives in [place], has a history of bipolar disorder, PTSD, drug-induced Parkinson's disease, alcohol use disorder, cocaine use disorder, cannabis use disorder, tobacco use disorder, hyperlipidemia, diabetes melitis, seizure disorder, IBS, presented to clinic today for a follow-up visit.  Pt today returns for a follow-up visit.  She reports she has been having worsening mood symptoms.  She reports low energy, fatigue, sleep issues, inability to function during the day, feeling sorry for herself, unable to take care of herself on a regular basis and so on.  She reports this has been going on since the past few weeks.  She reports she was sick with bronchitis for a while and now she is getting sick again.  She reports she was prescribed prednisone by her primary medical doctor and she was on it for a few days which may have helped to some extent.  Patient reports she has increased her trazodone to a higher dosage and it still does not help.  Patient reports she continues to stay away from cannabis and cocaine.  She reports she uses alcohol socially and only 1-2 drinks when she does that.  She never binges on it anymore.  Patient continues to have financial problems.  Discussed medication readjustment.  Discussed referral for psychotherapy sessions again.   Discussed with her that her olanzapine can be increased and she continued to make use of her other medications.  Discussed with her to return to clinic in 2 weeks.  She agrees with plan.  Visit Diagnosis:    ICD-10-CM   1. Bipolar I disorder, most recent episode depressed ([place]) F31.30 OLANZapine (ZYPREXA) 15 MG tablet    traZODone (DESYREL) 100 MG tablet  2. PTSD (post-traumatic stress disorder) F43.10  OLANZapine (ZYPREXA) 15 MG tablet  3. Cocaine use disorder, mild, in early remission ([place]) F14.11   4. Alcohol use disorder, mild, abuse F10.10   5. Cannabis use disorder, mild, in early remission F12.11   6. Drug-induced Parkinson's disease ([place]) G21.19   7. Restless leg syndrome G25.81   8. Tobacco use disorder F17.200     Past Psychiatric History: I have reviewed past psychiatric history from my progress note on 04/02/2017.  Past trials of Seroquel, Latuda, Ambien, clonazepam, Topamax, Lunesta, gabapentin, Trintellix, Abilify, Rexulti.       Past Medical History:  Past Medical History:  Diagnosis Date  . Allergy   . Anxiety   . Asthma   . Bipolar disorder ([place])   . Bursitis   . Depression   . Diabetes mellitus ([place]) 09/2017  . Diabetes mellitus, type II ([place])   . Essential hypertension 03/28/2017  . Frequent headaches   . GERD (gastroesophageal reflux disease)   . Headache   . High cholesterol   . Hypertension   . Irritable bowel syndrome (IBS)   . Irritable colon 02/23/2010  . Migraine 04/02/2017  . Obesity (BMI 30-39.9) 11/25/2013  . Seizure ([place]) [phone]  . Seizures ([place])   . Tremor     Past Surgical History:  Procedure Laterality Date  . NO PAST SURGERIES      Family Psychiatric History: Reviewed  family psychiatric history from my progress note on 04/02/2017  Family History:  Family History  Problem Relation Age of  Onset  . Bipolar disorder Mother   . Schizophrenia Mother   . Hypertension Mother   . Diabetes Mother   . Cancer Mother   . Anxiety disorder Mother   . ADD / ADHD Mother   . Alcohol abuse Mother   . Drug abuse Mother   . Bipolar disorder Sister   . Anxiety disorder Sister   . Drug abuse Sister   . Bipolar disorder Brother   . Anxiety disorder Brother   . Drug abuse Brother   . Hypertension Father   . Cancer Father   . Breast cancer Maternal Aunt   . Breast cancer Paternal Aunt   . Breast cancer Maternal Grandmother   . Alcohol abuse  Maternal Grandmother   . Breast cancer Paternal Grandmother   . Parkinson's disease Maternal Grandfather   . Alcohol abuse Maternal Grandfather   . Alcohol abuse Maternal Uncle   . ADD / ADHD Son   . Tremor Neg Hx     Social History: Reviewed social history from my progress note on 04/02/2017. Social History   Socioeconomic History  . Marital status: Married    Spouse name: dewey   . Number of children: 1  . Years of education: 76  . Highest education level: Associate degree: occupational, Hotel manager, or vocational program  Occupational History  . Occupation: disability  Social Needs  . Financial resource strain: Very hard  . Food insecurity:    Worry: Often true    Inability: Often true  . Transportation needs:    Medical: No    Non-medical: No  Tobacco Use  . Smoking status: Current Every Day Smoker    Packs/day: 0.50    Years: 27.00    Pack years: 13.50    Types: Cigarettes  . Smokeless tobacco: Current User  Substance and Sexual Activity  . Alcohol use: Not Currently  . Drug use: No  . Sexual activity: Yes    Partners: Male    Birth control/protection: None  Lifestyle  . Physical activity:    Days per week: 0 days    Minutes per session: 0 min  . Stress: Very much  Relationships  . Social connections:    Talks on phone: Never    Gets together: Never    Attends religious service: Never    Active member of club or organization: No    Attends meetings of clubs or organizations: Never    Relationship status: Married  Other Topics Concern  . Not on file  Social History Narrative   Lives with husband, [name] (on [place])   Right-handed   Caffeine use: 1 cup coffee a day, 2 soft drinks per day    Allergies:  Allergies  Allergen Reactions  . Amitriptyline Other (See Comments)    Confusion, paralysis    Metabolic Disorder Labs: Lab Results  Component Value Date   HGBA1C 6.2 (A) 02/21/2018   MPG 128 11/19/2017   MPG 137 08/13/2017   Lab  Results  Component Value Date   PROLACTIN 4.6 (L) 04/22/2017   PROLACTIN 10.2 11/10/2015   Lab Results  Component Value Date   CHOL 149 03/05/2018   TRIG 637 (H) 03/05/2018   HDL 26 (L) 03/05/2018   CHOLHDL 5.7 (H) 03/05/2018   [place]  03/05/2018     Comment:     . LDL cholesterol not calculated. Triglyceride levels greater than 400 mg/dL invalidate calculated LDL results. . Reference range: <100 . Desirable range <100 mg/dL for primary prevention;   <  70 mg/dL for patients with CHD or diabetic patients  with > or = 2 CHD risk factors. Marland Kitchen LDL-C is now calculated using the Martin-Hopkins  calculation, which is a validated novel method providing  better accuracy than the Friedewald equation in the  estimation of LDL-C.  Cresenciano Genre et al. Annamaria Helling. 4081;448(18): 2061-2068  (http://education.[employer].com/faq/FAQ164)    [place] 81 11/19/2017   Lab Results  Component Value Date   TSH 2.08 11/19/2017   TSH 2.130 10/25/2016    Therapeutic Level Labs: No results found for: LITHIUM Lab Results  Component Value Date   VALPROATE 61.4 08/13/2017   VALPROATE <10 (L) 04/22/2017   No components found for:  CBMZ  Current Medications: Current Outpatient Medications  Medication Sig Dispense Refill  . ACCU-CHEK SOFTCLIX LANCETS lancets by Other route. Use as instructed    . acetaminophen (TYLENOL) 500 MG tablet Take 500 mg by mouth every 6 (six) hours as needed.    Marland Kitchen albuterol (PROAIR HFA) 108 (90 Base) MCG/ACT inhaler Inhale 2 puffs into the lungs every 4 (four) hours as needed for wheezing or shortness of breath. 8 g 5  . atorvastatin (LIPITOR) 40 MG tablet Take 1 tablet (40 mg total) by mouth daily. 90 tablet 3  . baclofen (LIORESAL) 10 MG tablet Take 1 tablet (10 mg total) by mouth 3 (three) times daily as needed for muscle spasms. 60 each 3  . blood glucose meter kit and supplies Dispense based on patient and insurance preference. Use up to four times daily as directed. (FOR  ICD-10 E10.9, E11.9). 1 each 0  . budesonide-formoterol (SYMBICORT) 160-4.5 MCG/ACT inhaler Inhale 2 puffs into the lungs 2 (two) times daily. 1 Inhaler 3  . Calcium Carb-Cholecalciferol (CALCIUM 600+D3 PO) Take 1 tablet by mouth daily.    . divalproex (DEPAKOTE) 500 MG DR tablet TAKE 1 TABLET BY MOUTH IN THE MORNING AND 2 AT BEDTIME 270 tablet 1  . Dulaglutide (TRULICITY) 1.5 HU/3.1SH SOPN Inject 1.5 mg into the skin every 7 (seven) days. 4 pen 12  . esomeprazole (NEXIUM) 20 MG capsule Take 1 capsule (20 mg total) by mouth daily at 12 noon. 90 capsule 3  . gabapentin (NEURONTIN) 300 MG capsule Take 1 capsule (300 mg total) by mouth at bedtime. For restless legs 90 capsule 1  . glucose blood (ACCU-CHEK AVIVA PLUS) test strip USE 1 STRIP TO CHECK GLUCOSE 4 TIMES DAILY AS DIRECTED. 400 each 3  . ibuprofen (ADVIL,MOTRIN) 200 MG tablet Take 400-800 mg by mouth every 6 (six) hours as needed (for pain or headaches).    Marland Kitchen lisinopril (PRINIVIL,ZESTRIL) 10 MG tablet Take 1 tablet (10 mg total) by mouth daily. 90 tablet 1  . metFORMIN (GLUCOPHAGE) 500 MG tablet Take 1 tablet (500 mg total) by mouth 2 (two) times daily with a meal. 180 tablet 3  . norethindrone-ethinyl estradiol (JUNEL FE,GILDESS FE,LOESTRIN FE) 1-20 MG-MCG tablet Take 1 tablet by mouth daily. 1 Package 11  . OLANZapine (ZYPREXA) 15 MG tablet Take 1 tablet (15 mg total) by mouth at bedtime. 90 tablet 0  . omega-3 acid ethyl esters (LOVAZA) 1 g capsule Take 1 capsule (1 g total) by mouth 2 (two) times daily. 180 capsule 1  . primidone (MYSOLINE) 50 MG tablet Take 1 tablet (50 mg total) by mouth at bedtime. 90 tablet 1  . sertraline (ZOLOFT) 100 MG tablet Take 1 tablet (100 mg total) by mouth daily. 90 tablet 1  . traZODone (DESYREL) 100 MG tablet Take 1 tablet (100  mg total) by mouth at bedtime. For sleep 90 tablet 1  . trihexyphenidyl (ARTANE) 5 MG tablet Take 1 tablet (5 mg total) by mouth 3 (three) times daily with meals. 270 tablet 1   No  current facility-administered medications for this visit.      Musculoskeletal: Strength & Muscle Tone: within normal limits Gait & Station: normal Patient leans: N/A  Psychiatric Specialty Exam: Review of Systems  Psychiatric/Behavioral: Positive for depression. The patient has insomnia.   All other systems reviewed and are negative.   Blood pressure 139/76, pulse 93, temperature 98.8 F (37.1 C), temperature source Oral, weight 182 lb 9.6 oz (82.8 kg).Body mass index is 33.4 kg/m.  General Appearance: Casual  Eye Contact:  Fair  Speech:  Clear and Coherent  Volume:  Normal  Mood:  Dysphoric  Affect:  Appropriate  Thought Process:  Goal Directed and Descriptions of Associations: Intact  Orientation:  Full (Time, Place, and Person)  Thought Content: Logical   Suicidal Thoughts:  No  Homicidal Thoughts:  No  Memory:  Immediate;   Fair Recent;   Fair Remote;   Fair  Judgement:  Fair  Insight:  Fair  Psychomotor Activity:  Normal  Concentration:  Concentration: Fair and Attention Span: Fair  Recall:  [employer] of Knowledge: Fair  Language: Fair  Akathisia:  No  Handed:  Right  AIMS (if indicated): denies rigidity,stiffness  Assets:  Communication Skills Desire for Improvement Social Support  ADL's:  Intact  Cognition: WNL  Sleep:  Poor   Screenings: AIMS     Office Visit from 01/01/2018 in [facility] Total Score  1    GAD-7     [facility] Visit from 09/06/2017 in [facility]  Total GAD-7 Score  8    PHQ2-9     Office Visit from 02/21/2018 in [facility] from 11/19/2017 in [facility] Office Visit from 09/06/2017 in [facility] Office Visit from 03/28/2017 in [facility] Office Visit from 02/28/2017 in [facility]  PHQ-2 Total Score  0  '4  4  6  6  '$ PHQ-9 Total Score  0  '17  18  25  19       '$ Assessment and Plan:Allyce  is a 43 year old Caucasian female who has a history of bipolar disorder, PTSD, panic disorder, drug-induced parkinsonism, seizure disorder,  diabetes melitis, polysubstance abuse, presented to clinic today for a follow-up visit.  Patient is biologically predisposed given her history of trauma as well as multiple medical problems and mental health problems in her family.  She also has a history of abusing substances in the past .  Patient continues to struggle with depressive symptoms and sleep problems.  Discussed medication changes as noted below.  Plan For bipolar disorder-unstable Depakote 500 mg p.o. 3 times daily Depakote level on 08/13/2017-61.4-therapeutic Increase Zyprexa to 15 mg p.o. nightly.   Zoloft 100 mg p.o. daily  For panic attacks-stable Zoloft 100 mg p.o. daily  For PTSD-improving  Zoloft 100 mg p.o. daily Referred for CBT  For insomnia-unstable Increase trazodone to 100 mg p.o. nightly  For restless leg syndrome-improving Gabapentin 300 mg p.o. nightly  For drug-induced Parkinson's disease-improving Artane 5 mg p.o. 3 times daily  For alcohol use disorder/cocaine cannabis use disorder-improving  continue to provide substance abuse counseling.  For tobacco use disorder-unstable She is trying to cut down.Provided smoking cessation counseling.  Discussed referral  for psychotherapy sessions-patient declined.  Have reviewed medical records in [facility] per NP Lauren Kennedy-dated 02/21/2018-' patient seen for bronchitis was started on prednisone.'  Follow-up in clinic in 2 weeks or sooner if needed.  I have spent atleast 25 minutes face to face with patient today. More than 50 % of the time was spent for psychoeducation and supportive psychotherapy and care coordination.  This note was generated in part or whole with voice recognition software. Voice recognition is usually quite accurate but there are transcription errors that can and very often do occur. I apologize for  any typographical errors that were not detected and corrected.          Ursula Alert, MD 03/12/2018, 2:38 PM

## 2018-03-20 ENCOUNTER — Encounter: Payer: Self-pay | Admitting: Nurse Practitioner

## 2018-03-24 ENCOUNTER — Encounter: Payer: Self-pay | Admitting: Nurse Practitioner

## 2018-03-24 NOTE — Telephone Encounter (Signed)
.  mychart

## 2018-03-24 NOTE — Telephone Encounter (Signed)
Engineer, structural

## 2018-03-25 ENCOUNTER — Encounter: Payer: Self-pay | Admitting: Nurse Practitioner

## 2018-03-27 ENCOUNTER — Ambulatory Visit: Payer: Self-pay | Admitting: Nurse Practitioner

## 2018-03-28 ENCOUNTER — Ambulatory Visit: Payer: Medicare Other | Admitting: Psychiatry

## 2018-03-31 ENCOUNTER — Ambulatory Visit (INDEPENDENT_AMBULATORY_CARE_PROVIDER_SITE_OTHER): Payer: Medicare Other | Admitting: Nurse Practitioner

## 2018-03-31 ENCOUNTER — Ambulatory Visit
Admission: RE | Admit: 2018-03-31 | Discharge: 2018-03-31 | Disposition: A | Discharge status: Home / Self Care | Payer: Medicare Other | Source: Ambulatory Visit | Attending: Nurse Practitioner | Admitting: Nurse Practitioner

## 2018-03-31 ENCOUNTER — Other Ambulatory Visit: Payer: Self-pay

## 2018-03-31 ENCOUNTER — Encounter: Payer: Self-pay | Admitting: Nurse Practitioner

## 2018-03-31 VITALS — BP 129/84 | HR 85 | Temp 98.9°F | Ht 62.0 in | Wt 185.4 lb

## 2018-03-31 DIAGNOSIS — R05 Cough: Secondary | ICD-10-CM | POA: Diagnosis present

## 2018-03-31 DIAGNOSIS — E1165 Type 2 diabetes mellitus with hyperglycemia: Secondary | ICD-10-CM

## 2018-03-31 DIAGNOSIS — J410 Simple chronic bronchitis: Secondary | ICD-10-CM | POA: Diagnosis not present

## 2018-03-31 DIAGNOSIS — M25552 Pain in left hip: Secondary | ICD-10-CM

## 2018-03-31 DIAGNOSIS — R059 Cough, unspecified: Secondary | ICD-10-CM

## 2018-03-31 DIAGNOSIS — Z794 Long term (current) use of insulin: Secondary | ICD-10-CM

## 2018-03-31 DIAGNOSIS — M62830 Muscle spasm of back: Secondary | ICD-10-CM

## 2018-03-31 DIAGNOSIS — M25551 Pain in right hip: Secondary | ICD-10-CM

## 2018-03-31 MED ORDER — TIZANIDINE HCL 4 MG PO TABS: 2.0000 mg | ORAL_TABLET | Freq: Four times a day (QID) | ORAL | 0 refills | Status: DC | PRN

## 2018-03-31 MED ORDER — BENZONATATE 100 MG PO CAPS: 100.0000 mg | ORAL_CAPSULE | Freq: Two times a day (BID) | ORAL | 0 refills | Status: DC | PRN

## 2018-03-31 NOTE — Progress Notes (Signed)
Subjective:    Patient ID: Vanessa Baker, female    DOB: 30-Jun-1975, 43 y.o.   MRN: [id number]  Vanessa Baker is a 43 y.o. female presenting on 03/31/2018 for Diabetes (pt blood sugars are irregular, but she admits it could be due to not eating and drinking the appropiate things); Sore Throat (scratchy throat, pt states it's been going since November when she was diagnose w/ influenza that turned into pneumonia/ bronchitis. Now she currently got a dyy cough that have not improved. ); and Hip Pain (chronic hip pain)  HPI Diabetes Pt presents today for follow up of Type 2 diabetes mellitus. She is checking fasting am CBG at home with a range of 115-240, usually 115-130 - Current diabetic medications include: metformin and lantus, trulicity 1.5 mg once daily.  No ongoing use of rapid acting insulin. - She is not currently symptomatic.  - She denies polydipsia, polyphagia, polyuria, headaches, diaphoresis, shakiness, chills, pain, numbness or tingling in extremities and changes in vision.   - Clinical course has been improving. - She  reports no regular exercise routine. - Her diet is high in salt, high in fat, and high in carbohydrates. - Weight trend: stable  PREVENTION: Eye exam current (within one year): no Foot exam current (within one year): yes Lipid/ASCVD risk reduction - on statin: atorvastatin Kidney protection - on ace or arb: lisinopril Recent Labs    04/22/17 1135 08/13/17 1033 11/19/17 1021 02/21/18 1656  HGBA1C 6.2* 6.4* 6.1* 6.2*   Dry Cough/Sore and scratchy throat Patient has had dry cough since flu illness several weeks ago.  No prior dry cough with lisinopril in past.  Cough remains accompanied with sore and scratchy throat.  Patient did also have complication of bronchitis with flu illness. - New chills sweats over last 2 weeks. - Continues also having rhinorrhea.  Hip pain Chronic low back and bilateral hip pain, requests treatment today to  address aching, soreness in lateral hips L>R as primary symptoms. Patient went to neurology several years ago at [facility] with diagnosis of bursitis bilaterally.  Patient never had injections for bursitis after this was recommended in past.  Requests referral today for injections to orthopedics.  Important to note was recent prednisone for respiratory illness, but no change in hip pain.   Social History   Tobacco Use  . Smoking status: Current Every Day Smoker    Packs/day: 0.50    Years: 27.00    Pack years: 13.50    Types: Cigarettes  . Smokeless tobacco: Current User  Substance Use Topics  . Alcohol use: Not Currently  . Drug use: No    Review of Systems Per HPI unless specifically indicated above     Objective:    BP 129/84 (BP Location: Right Arm, Patient Position: Sitting, Cuff Size: Normal)   Pulse 85   Temp 98.9 F (37.2 C) (Oral)   Ht 5\' 2"  (1.575 m)   Wt 185 lb 6.4 oz (84.1 kg)   SpO2 97%   BMI 33.91 kg/m   Wt Readings from Last 3 Encounters:  03/31/18 185 lb 6.4 oz (84.1 kg)  02/21/18 184 lb (83.5 kg)  01/20/18 184 lb 9.6 oz (83.7 kg)    Physical Exam Vitals signs reviewed.  Constitutional:      General: She is not in acute distress.    Appearance: She is well-developed.  HENT:     Head: Normocephalic and atraumatic.  Cardiovascular:     Rate and Rhythm: Normal  rate and regular rhythm.     Pulses:          Radial pulses are 2+ on the right side and 2+ on the left side.       Posterior tibial pulses are 1+ on the right side and 1+ on the left side.     Heart sounds: Normal heart sounds, S1 normal and S2 normal.  Pulmonary:     Effort: Pulmonary effort is normal. No respiratory distress.     Breath sounds: Normal breath sounds and air entry.  Abdominal:     General: Bowel sounds are normal. There is no distension.     Palpations: Abdomen is soft.     Tenderness: There is no abdominal tenderness.     Hernia: No hernia is present.    Musculoskeletal:     Right lower leg: No edema.     Left lower leg: No edema.     Comments: Bilateral hip Inspection: normal appearance, large body habitus Palpation: tender to palpation at lateral hip, non-tender posterior/anterior ROM: Limited flexion, abduction, internal rotation.  Normal adduction, external rotation. Special Testing: none Strength: 5/5 hip, knee, ankle Neurovascular: intact to light touch and sensation BLE   Skin:    General: Skin is warm and dry.     Capillary Refill: Capillary refill takes less than 2 seconds.  Neurological:     General: No focal deficit present.     Mental Status: She is alert and oriented to person, place, and time. Mental status is at baseline.  Psychiatric:        Attention and Perception: Attention normal.        Mood and Affect: Mood and affect normal.        Behavior: Behavior normal. Behavior is cooperative.        Thought Content: Thought content normal.        Judgment: Judgment normal.    Results for orders placed or performed in visit on 02/21/18  COMPLETE METABOLIC PANEL WITH GFR  Result Value Ref Range   Glucose, Bld 123 (H) 65 - 99 mg/dL   BUN 18 7 - 25 mg/dL   Creat 0.62 0.50 - 1.10 mg/dL   GFR, Est Non African American 110 > OR = 60 mL/min/1.33m2   GFR, Est African American 127 > OR = 60 mL/min/1.81m2   BUN/Creatinine Ratio NOT APPLICABLE 6 - 22 (calc)   Sodium 136 135 - 146 mmol/L   Potassium 3.5 3.5 - 5.3 mmol/L   Chloride 97 (L) 98 - 110 mmol/L   CO2 25 20 - 32 mmol/L   Calcium 9.7 8.6 - 10.2 mg/dL   Total Protein 6.4 6.1 - 8.1 g/dL   Albumin 4.2 3.6 - 5.1 g/dL   Globulin 2.2 1.9 - 3.7 g/dL (calc)   AG Ratio 1.9 1.0 - 2.5 (calc)   Total Bilirubin 0.2 0.2 - 1.2 mg/dL   Alkaline phosphatase (APISO) 53 33 - 115 U/L   AST 15 10 - 30 U/L   ALT 14 6 - 29 U/L  Lipid panel  Result Value Ref Range   Cholesterol 149 <200 mg/dL   HDL 26 (L) >50 mg/dL   Triglycerides 637 (H) <150 mg/dL   LDL Cholesterol (Calc)   mg/dL (calc)   Total CHOL/HDL Ratio 5.7 (H) <5.0 (calc)   Non-HDL Cholesterol (Calc) 123 <130 mg/dL (calc)  POCT HgB A1C  Result Value Ref Range   Hemoglobin A1C 6.2 (A) 4.0 - 5.6 %   HbA1c POC (<>  result, manual entry)     HbA1c, POC (prediabetic range)     HbA1c, POC (controlled diabetic range)        Assessment & Plan:   Problem List Items Addressed This Visit      Endocrine   Type 2 diabetes mellitus with hyperglycemia, with long-term current use of insulin (HCC) Stable, controlled T2DM with CBG readings consistent with last A1c.  Goal A1c < 7.0%. - Complications - hyperglycemia.  Plan:  1. Continue current therapy: Tyler Aas and metformin 2. Encourage improved lifestyle: - low carb/low glycemic diet reinforced prior education, provided carbohydrate counting resource as well. - Increase physical activity to 30 minutes most days of the week.  Explained that increased physical activity increases body's use of sugar for energy. 3. Check fasting am CBG and bring log to next visit for review 4. Continue ASA, ACEi and Statin 5. Advised to schedule DM ophtho exam, send record. 6. Follow-up 2 months with repeat A1c    Other Visit Diagnoses    Cough    -  Primary Postviral cough due to recent viral infection with bronchitis.  No complications. - START Tessalon 100 mg cap bid x 10 days. - Chest Xray today rule out pneumonia. - Follow-up prn only   Relevant Medications   benzonatate (TESSALON) 100 MG capsule   Other Relevant Orders   DG Chest 2 View (Completed)   Spasm of muscle of lower back     Bilateral hip pain  Patient with chronic bilateral hip pain and hypertonicity of low back muscles.  Patient without relief from baclofen at this time.  - Continue pain mangement as in past except STOP baclofen.  START tizanidine 2-4 mg tid prn. - FOLLOW-UP for hip injections with Dr. Parks Ranger for possible bursitis.  If not effective, will need additional eval from orthopedics for future  treatment.    Relevant Medications   tiZANidine (ZANAFLEX) 4 MG tablet   Simple chronic bronchitis (HCC)     On Xray results, likely COPD, not currently responding to Symbicort therapy.  START trelegy. Follow-up 2 months.   Relevant Medications   Fluticasone-Umeclidin-Vilant (TRELEGY ELLIPTA) 100-62.5-25 MCG/INH AEPB      Meds ordered this encounter  Medications  . benzonatate (TESSALON) 100 MG capsule    Sig: Take 1 capsule (100 mg total) by mouth 2 (two) times daily as needed for cough.    Dispense:  30 capsule    Refill:  0    Order Specific Question:   Supervising Provider    Answer:   Olin Hauser [[id number]]  . tiZANidine (ZANAFLEX) 4 MG tablet    Sig: Take 0.5-1 tablets (2-4 mg total) by mouth every 6 (six) hours as needed for muscle spasms.    Dispense:  30 tablet    Refill:  0    Order Specific Question:   Supervising Provider    Answer:   Olin Hauser [[id number]]  . Fluticasone-Umeclidin-Vilant (TRELEGY ELLIPTA) 100-62.5-25 MCG/INH AEPB    Sig: Inhale 1 puff into the lungs daily.    Dispense:  30 each    Refill:  5    Order Specific Question:   Supervising Provider    Answer:   Olin Hauser [[id number]]    Follow up plan: Return in about 2 months (around 05/30/2018) for diabetes with A1c, hip bursa injections w/ Dr. Raliegh Ip anytime.  Cassell Smiles, DNP, AGPCNP-BC Adult Gerontology Primary Care Nurse Practitioner [facility] 03/31/2018, 3:39 PM

## 2018-03-31 NOTE — Patient Instructions (Addendum)
Vanessa Baker,   Thank you for coming in to clinic today.  1. For cough: - Xray today - we will treat pneumonia if present or we will change your Symbicort  2. Hip pain: - Bursitis is present.  Continue use of heat/ice over next couple of weeks.  May continue acetaminophen and ibuprofen as well.  3. For low back pain: - STOP baclofen - START tizanidine 4 mg tablet up to three times daily as needed.  If causes you to be sleepy, take 1/2 tablet during day.  4. For diabetes: - Check CBG only in am before meals.   - Spread your less healthy carbohydrates across your entire month so you have fewer blood sugar spikes. - Repeat visit in 2 months with A1c (average blood sugar for 3 months).  Based on today's glucose reading sheets, you should still be at your goal. - Continue Tyler Aas and metformin  Please schedule a follow-up appointment with Cassell Smiles, AGNP. Return in about 2 months (around 05/30/2018) for diabetes with A1c, hip bursa injections w/ Dr. Raliegh Ip anytime.  If you have any other questions or concerns, please feel free to call the clinic or send a message through [place]. You may also schedule an earlier appointment if necessary.  You will receive a survey after today's visit either digitally by e-mail or paper by [employer]. Your experiences and feedback matter to [place].  Please respond so we know how we are doing as we provide care for you.   Cassell Smiles, DNP, AGNP-BC Adult Gerontology Nurse Practitioner [facility], [facility]   Diabetes Mellitus and Nutrition, Adult When you have diabetes (diabetes mellitus), it is very important to have healthy eating habits because your blood sugar (glucose) levels are greatly affected by what you eat and drink. Eating healthy foods in the appropriate amounts, at about the same times every day, can help you:  Control your blood glucose.  Lower your risk of heart disease.  Improve your blood pressure.  Reach or  maintain a healthy weight. Every person with diabetes is different, and each person has different needs for a meal plan. Your health care provider may recommend that you work with a diet and nutrition specialist (dietitian) to make a meal plan that is best for you. Your meal plan may vary depending on factors such as:  The calories you need.  The medicines you take.  Your weight.  Your blood glucose, blood pressure, and cholesterol levels.  Your activity level.  Other health conditions you have, such as heart or kidney disease. How do carbohydrates affect me? Carbohydrates, also called carbs, affect your blood glucose level more than any other type of food. Eating carbs naturally raises the amount of glucose in your blood. Carb counting is a method for keeping track of how many carbs you eat. Counting carbs is important to keep your blood glucose at a healthy level, especially if you use insulin or take certain oral diabetes medicines. It is important to know how many carbs you can safely have in each meal. This is different for every person. Your dietitian can help you calculate how many carbs you should have at each meal and for each snack. Foods that contain carbs include:  Bread, cereal, rice, pasta, and crackers.  Potatoes and corn.  Peas, beans, and lentils.  Milk and yogurt.  Fruit and juice.  Desserts, such as cakes, cookies, ice cream, and candy. How does alcohol affect me? Alcohol can cause a sudden  decrease in blood glucose (hypoglycemia), especially if you use insulin or take certain oral diabetes medicines. Hypoglycemia can be a life-threatening condition. Symptoms of hypoglycemia (sleepiness, dizziness, and confusion) are similar to symptoms of having too much alcohol. If your health care provider says that alcohol is safe for you, follow these guidelines:  Limit alcohol intake to no more than 1 drink per day for nonpregnant women and 2 drinks per day for men. One  drink equals 12 oz of beer, 5 oz of wine, or 1 oz of hard liquor.  Do not drink on an empty stomach.  Keep yourself hydrated with water, diet soda, or unsweetened iced tea.  Keep in mind that regular soda, juice, and other mixers may contain a lot of sugar and must be counted as carbs. What are tips for following this plan?  Reading food labels  Start by checking the serving size on the "Nutrition Facts" label of packaged foods and drinks. The amount of calories, carbs, fats, and other nutrients listed on the label is based on one serving of the item. Many items contain more than one serving per package.  Check the total grams (g) of carbs in one serving. You can calculate the number of servings of carbs in one serving by dividing the total carbs by 15. For example, if a food has 30 g of total carbs, it would be equal to 2 servings of carbs.  Check the number of grams (g) of saturated and trans fats in one serving. Choose foods that have low or no amount of these fats.  Check the number of milligrams (mg) of salt (sodium) in one serving. Most people should limit total sodium intake to less than 2,300 mg per day.  Always check the nutrition information of foods labeled as "low-fat" or "nonfat". These foods may be higher in added sugar or refined carbs and should be avoided.  Talk to your dietitian to identify your daily goals for nutrients listed on the label. Shopping  Avoid buying canned, premade, or processed foods. These foods tend to be high in fat, sodium, and added sugar.  Shop around the outside edge of the grocery store. This includes fresh fruits and vegetables, bulk grains, fresh meats, and fresh dairy. Cooking  Use low-heat cooking methods, such as baking, instead of high-heat cooking methods like deep frying.  Cook using healthy oils, such as olive, canola, or sunflower oil.  Avoid cooking with butter, cream, or high-fat meats. Meal planning  Eat meals and snacks  regularly, preferably at the same times every day. Avoid going long periods of time without eating.  Eat foods high in fiber, such as fresh fruits, vegetables, beans, and whole grains. Talk to your dietitian about how many servings of carbs you can eat at each meal.  Eat 4-6 ounces (oz) of lean protein each day, such as lean meat, chicken, fish, eggs, or tofu. One oz of lean protein is equal to: ? 1 oz of meat, chicken, or fish. ? 1 egg. ?  cup of tofu.  Eat some foods each day that contain healthy fats, such as avocado, nuts, seeds, and fish. Lifestyle  Check your blood glucose regularly.  Exercise regularly as told by your health care provider. This may include: ? 150 minutes of moderate-intensity or vigorous-intensity exercise each week. This could be brisk walking, biking, or water aerobics. ? Stretching and doing strength exercises, such as yoga or weightlifting, at least 2 times a week.  Take medicines as told by  your health care provider.  Do not use any products that contain nicotine or tobacco, such as cigarettes and e-cigarettes. If you need help quitting, ask your health care provider.  Work with a Social worker or diabetes educator to identify strategies to manage stress and any emotional and social challenges. Questions to ask a health care provider  Do I need to meet with a diabetes educator?  Do I need to meet with a dietitian?  What number can I call if I have questions?  When are the best times to check my blood glucose? Where to find more information:  American Diabetes Association: diabetes.org  Academy of Nutrition and Dietetics: www.eatright.[employer] of Diabetes and Digestive and Kidney Diseases (NIH): [employer] Summary  A healthy meal plan will help you control your blood glucose and maintain a healthy lifestyle.  Working with a diet and nutrition specialist (dietitian) can help you make a meal plan that is best for you.  Keep in  mind that carbohydrates (carbs) and alcohol have immediate effects on your blood glucose levels. It is important to count carbs and to use alcohol carefully. This information is not intended to replace advice given to you by your health care provider. Make sure you discuss any questions you have with your health care provider. Document Released: 10/19/2004 Document Revised: 08/22/2016 Document Reviewed: 02/27/2016 Elsevier Interactive Patient Education  2019 [employer].

## 2018-04-01 ENCOUNTER — Ambulatory Visit (INDEPENDENT_AMBULATORY_CARE_PROVIDER_SITE_OTHER): Payer: Medicare Other | Admitting: Family Medicine

## 2018-04-01 ENCOUNTER — Encounter: Payer: Self-pay | Admitting: Family Medicine

## 2018-04-01 ENCOUNTER — Other Ambulatory Visit: Payer: Self-pay

## 2018-04-01 ENCOUNTER — Encounter: Payer: Self-pay | Admitting: Nurse Practitioner

## 2018-04-01 VITALS — BP 111/55 | Temp 99.1°F | Resp 16 | Ht 62.0 in | Wt 186.4 lb

## 2018-04-01 DIAGNOSIS — M25551 Pain in right hip: Secondary | ICD-10-CM | POA: Diagnosis not present

## 2018-04-01 DIAGNOSIS — M25552 Pain in left hip: Secondary | ICD-10-CM | POA: Diagnosis not present

## 2018-04-01 DIAGNOSIS — M7062 Trochanteric bursitis, left hip: Secondary | ICD-10-CM

## 2018-04-01 DIAGNOSIS — G8929 Other chronic pain: Secondary | ICD-10-CM | POA: Diagnosis not present

## 2018-04-01 MED ORDER — FLUTICASONE-UMECLIDIN-VILANT 100-62.5-25 MCG/INH IN AEPB: 1.0000 | INHALATION_SPRAY | Freq: Every day | RESPIRATORY_TRACT | 5 refills | Status: DC

## 2018-04-01 MED ORDER — METHYLPREDNISOLONE ACETATE 40 MG/ML IJ SUSP
40.0000 mg | Freq: Once | INTRAMUSCULAR | Status: AC
  Administered 2018-04-01: 40 mg via INTRA_ARTICULAR

## 2018-04-01 MED ORDER — LIDOCAINE HCL (PF) 1 % IJ SOLN
4.0000 mL | Freq: Once | INTRAMUSCULAR | Status: AC
  Administered 2018-04-01: 4 mL
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
## 2018-04-01 NOTE — Patient Instructions (Addendum)
Thank you for coming to the office today.  You received a Left Hip Bursa steroid injection today. - Lidocaine numbing medicine may ease the pain initially for a few hours until it wears off - As discussed, you may experience a "steroid flare" this evening or within 24-48 hours, anytime medicine is injected into an inflamed joint it can cause the pain to get worse temporarily - Everyone responds differently to these injections, it depends on the patient and the severity of the joint problem, it may provide anywhere from days to weeks, to months of relief. Ideal response is >6 months relief - Try to take it easy for next 1-2 days, avoid over activity and strain on joint (limit walking for hip) - Recommend the following:   - For swelling - rest, elevation, and ice packs as needed for first few days   - For pain in future may use heating pad or moist heat as needed  If significantly better within 2-4 weeks, then we can offer a steroid injection on RIGHT hip, and come in for X-rays on BOTH hips.  If < 50% improved or concerns that it is not as effective - within 2-4 weeks - notify office, and we will refer you Orthopedic  If need Orthopedics could go to [facility] Address: [place], [place],  [address] Hours:  9AM-5PM Phone: [phone]   Please schedule a Follow-up Appointment to: Return in about 4 weeks (around 04/29/2018), or if symptoms worsen or fail to improve, for Hip pain.  If you have any other questions or concerns, please feel free to call the office or send a message through [place]. You may also schedule an earlier appointment if necessary.  Additionally, you may be receiving a survey about your experience at our office within a few days to 1 week by e-mail or mail. We value your feedback.  Nobie Putnam, DO [place]

## 2018-04-03 ENCOUNTER — Encounter: Payer: Self-pay | Admitting: Nurse Practitioner

## 2018-04-03 NOTE — Telephone Encounter (Signed)
Incoming message

## 2018-04-04 NOTE — Telephone Encounter (Signed)
Incoming

## 2018-04-07 ENCOUNTER — Telehealth: Payer: Self-pay

## 2018-04-07 ENCOUNTER — Other Ambulatory Visit: Payer: Self-pay

## 2018-04-07 ENCOUNTER — Encounter: Payer: Self-pay | Admitting: Psychiatry

## 2018-04-07 ENCOUNTER — Ambulatory Visit (INDEPENDENT_AMBULATORY_CARE_PROVIDER_SITE_OTHER): Payer: Medicare Other | Admitting: Psychiatry

## 2018-04-07 VITALS — BP 138/89 | HR 87 | Temp 99.2°F | Wt 182.6 lb

## 2018-04-07 DIAGNOSIS — F1411 Cocaine abuse, in remission: Secondary | ICD-10-CM

## 2018-04-07 DIAGNOSIS — F1211 Cannabis abuse, in remission: Secondary | ICD-10-CM

## 2018-04-07 DIAGNOSIS — F4310 Post-traumatic stress disorder, unspecified: Secondary | ICD-10-CM

## 2018-04-07 DIAGNOSIS — F1010 Alcohol abuse, uncomplicated: Secondary | ICD-10-CM | POA: Diagnosis not present

## 2018-04-07 DIAGNOSIS — F17200 Nicotine dependence, unspecified, uncomplicated: Secondary | ICD-10-CM

## 2018-04-07 DIAGNOSIS — F3130 Bipolar disorder, current episode depressed, mild or moderate severity, unspecified: Secondary | ICD-10-CM

## 2018-04-07 DIAGNOSIS — G2111 Neuroleptic induced parkinsonism: Secondary | ICD-10-CM

## 2018-04-07 MED ORDER — LITHIUM CARBONATE ER 300 MG PO TBCR: 300.0000 mg | EXTENDED_RELEASE_TABLET | Freq: Two times a day (BID) | ORAL | 0 refills | Status: DC

## 2018-04-07 MED ORDER — BENZTROPINE MESYLATE 1 MG PO TABS: 1.0000 mg | ORAL_TABLET | Freq: Two times a day (BID) | ORAL | 1 refills | Status: DC

## 2018-04-07 NOTE — Telephone Encounter (Signed)
received a fax from pharmacy that lithium carb er 300mg  interacts with lisinopril . could cause lithium toxicity, notification is in your basket.

## 2018-04-07 NOTE — Progress Notes (Signed)
[place] MD OP Progress Note  04/07/2018 5:48 PM Vanessa Baker  MRN:  [id number]  Chief Complaint: ' I am here for follow up." Chief Complaint    Follow-up; Medication Refill     HPI: Vanessa Baker is a 43 year old Caucasian female, divorced, lives in [place], has a history of bipolar disorder, PTSD, drug-induced Parkinson's disease, alcohol use disorder, cocaine use disorder, cannabis use disorder, tobacco use disorder, hyperlipidemia, diabetes melitis, seizure disorder, IBS, presented to clinic today for a follow-up visit.  Patient today reports she has been going through a lot of psychosocial stressors.  She reports her husband continues to struggle with mental health problems.  She reports she also has a lot of financial stressors.  She reports she is trying to go to the food bank however there are days when she does not have gas in her car to drive to the food bank.  She reports because of this her mood symptoms have been worsening.  Patient reports she has been compliant on her Depakote however does not think the Depakote is helpful anymore.  She reports her mother is on lithium and she would like her medication changed to lithium's if possible.  Provided her medication education.  Discussed with her the effect of lithium on her cardiac function, renal function as well as memory.  Discussed with her the need for frequent lithium levels.  Discussed with her the need for staying well-hydrated while on lithium.  Also discussed with her the interaction between lithium and ibuprofen,and other medications which she is on.  Patient reports she will monitor herself.  Provided her lab slip to go to [facility] in a week after starting lithium.  Discussed with her the need for psychotherapy sessions given her multiple psychosocial stressors.  Patient however declines reporting she is unable to afford it and also that she tried it several years ago and that it did not work.  Patient reports he continues to  take trazodone for sleep.  She reports sleep is fair.  She is also compliant on her olanzapine.  Patient with worsening tremors of her upper extremities.  She reports she has had it for a very long time.  She is on Artane which she reports is not helpful.  Will change it to Cogentin today.   Visit Diagnosis:    ICD-10-CM   1. Bipolar I disorder, most recent episode depressed ([place]) F31.30 lithium carbonate (LITHOBID) 300 MG CR tablet  2. PTSD (post-traumatic stress disorder) F43.10   3. Cocaine use disorder, mild, in early remission ([place]) F14.11   4. Alcohol use disorder, mild, abuse F10.10   5. Cannabis use disorder, mild, in early remission F12.11   6. Tobacco use disorder F17.200   7. Neuroleptic induced Parkinsonism (HCC) G21.11 benztropine (COGENTIN) 1 MG tablet    Past Psychiatric History: Reviewed past psychiatric history from my progress note on 04/02/2017.  Past trials of Seroquel, Latuda, Ambien, clonazepam, Topamax, Lunesta, gabapentin, Trintellix, Abilify, Rexulti.  Past Medical History:  Past Medical History:  Diagnosis Date  . Allergy   . Anxiety   . Asthma   . Bipolar disorder ([place])   . Bursitis   . Depression   . Diabetes mellitus ([place]) 09/2017  . Diabetes mellitus, type II ([place])   . Essential hypertension 03/28/2017  . Frequent headaches   . GERD (gastroesophageal reflux disease)   . Headache   . High cholesterol   . Hypertension   . Irritable bowel syndrome (IBS)   . Irritable colon 02/23/2010  .  Migraine 04/02/2017  . Obesity (BMI 30-39.9) 11/25/2013  . Seizure ([place]) [phone]  . Seizures ([place])   . Tremor     Past Surgical History:  Procedure Laterality Date  . NO PAST SURGERIES      Family Psychiatric History: Reviewed family psychiatric history from my progress note on 04/02/2017  Family History:  Family History  Problem Relation Age of Onset  . Bipolar disorder Mother   . Schizophrenia Mother   . Hypertension Mother   . Diabetes Mother   . Cancer  Mother   . Anxiety disorder Mother   . ADD / ADHD Mother   . Alcohol abuse Mother   . Drug abuse Mother   . Bipolar disorder Sister   . Anxiety disorder Sister   . Drug abuse Sister   . Bipolar disorder Brother   . Anxiety disorder Brother   . Drug abuse Brother   . Hypertension Father   . Cancer Father   . Breast cancer Maternal Aunt   . Breast cancer Paternal Aunt   . Breast cancer Maternal Grandmother   . Alcohol abuse Maternal Grandmother   . Breast cancer Paternal Grandmother   . Parkinson's disease Maternal Grandfather   . Alcohol abuse Maternal Grandfather   . Alcohol abuse Maternal Uncle   . ADD / ADHD Son   . Tremor Neg Hx     Social History: I have reviewed social history from my progress note on 04/02/2017 Social History   Socioeconomic History  . Marital status: Married    Spouse name: Vanessa Baker   . Number of children: 1  . Years of education: 8  . Highest education level: Associate degree: occupational, Hotel manager, or vocational program  Occupational History  . Occupation: disability  Social Needs  . Financial resource strain: Very hard  . Food insecurity:    Worry: Often true    Inability: Often true  . Transportation needs:    Medical: No    Non-medical: No  Tobacco Use  . Smoking status: Current Every Day Smoker    Packs/day: 0.50    Years: 27.00    Pack years: 13.50    Types: Cigarettes  . Smokeless tobacco: Current User  Substance and Sexual Activity  . Alcohol use: Not Currently  . Drug use: No  . Sexual activity: Yes    Partners: Male    Birth control/protection: None  Lifestyle  . Physical activity:    Days per week: 0 days    Minutes per session: 0 min  . Stress: Very much  Relationships  . Social connections:    Talks on phone: Never    Gets together: Never    Attends religious service: Never    Active member of club or organization: No    Attends meetings of clubs or organizations: Never    Relationship status: Married  Other  Topics Concern  . Not on file  Social History Narrative   Lives with husband, Vanessa Baker (on [place])   Right-handed   Caffeine use: 1 cup coffee a day, 2 soft drinks per day    Allergies:  Allergies  Allergen Reactions  . Amitriptyline Other (See Comments)    Confusion, paralysis    Metabolic Disorder Labs: Lab Results  Component Value Date   HGBA1C 6.2 (A) 02/21/2018   MPG 128 11/19/2017   MPG 137 08/13/2017   Lab Results  Component Value Date   PROLACTIN 4.6 (L) 04/22/2017   PROLACTIN 10.2 11/10/2015   Lab Results  Component Value Date   CHOL 149 03/05/2018   TRIG 637 (H) 03/05/2018   HDL 26 (L) 03/05/2018   CHOLHDL 5.7 (H) 03/05/2018   LDLCALC  03/05/2018     Comment:     . LDL cholesterol not calculated. Triglyceride levels greater than 400 mg/dL invalidate calculated LDL results. . Reference range: <100 . Desirable range <100 mg/dL for primary prevention;   <70 mg/dL for patients with CHD or diabetic patients  with > or = 2 CHD risk factors. Marland Kitchen LDL-C is now calculated using the Martin-Hopkins  calculation, which is a validated novel method providing  better accuracy than the Friedewald equation in the  estimation of LDL-C.  Cresenciano Genre et al. Annamaria Helling. 0175;102(58): 2061-2068  (http://education.[employer].com/faq/FAQ164)    [place] 81 11/19/2017   Lab Results  Component Value Date   TSH 2.08 11/19/2017   TSH 2.130 10/25/2016    Therapeutic Level Labs: No results found for: LITHIUM Lab Results  Component Value Date   VALPROATE 61.4 08/13/2017   VALPROATE <10 (L) 04/22/2017   No components found for:  CBMZ  Current Medications: Current Outpatient Medications  Medication Sig Dispense Refill  . ACCU-CHEK SOFTCLIX LANCETS lancets by Other route. Use as instructed    . acetaminophen (TYLENOL) 500 MG tablet Take 500 mg by mouth every 6 (six) hours as needed.    Marland Kitchen albuterol (PROAIR HFA) 108 (90 Base) MCG/ACT inhaler Inhale 2 puffs into the  lungs every 4 (four) hours as needed for wheezing or shortness of breath. 8 g 5  . atorvastatin (LIPITOR) 40 MG tablet Take 1 tablet (40 mg total) by mouth daily. 90 tablet 3  . benzonatate (TESSALON) 100 MG capsule Take 1 capsule (100 mg total) by mouth 2 (two) times daily as needed for cough. 30 capsule 0  . blood glucose meter kit and supplies Dispense based on patient and insurance preference. Use up to four times daily as directed. (FOR ICD-10 E10.9, E11.9). 1 each 0  . budesonide-formoterol (SYMBICORT) 160-4.5 MCG/ACT inhaler Inhale 2 puffs into the lungs 2 (two) times daily. 1 Inhaler 3  . Calcium Carb-Cholecalciferol (CALCIUM 600+D3 PO) Take 1 tablet by mouth daily.    . Dulaglutide (TRULICITY) 1.5 NI/7.7OE SOPN Inject 1.5 mg into the skin every 7 (seven) days. 4 pen 12  . esomeprazole (NEXIUM) 20 MG capsule Take 1 capsule (20 mg total) by mouth daily at 12 noon. 90 capsule 3  . Fluticasone-Umeclidin-Vilant (TRELEGY ELLIPTA) 100-62.5-25 MCG/INH AEPB Inhale 1 puff into the lungs daily. 30 each 5  . gabapentin (NEURONTIN) 300 MG capsule Take 1 capsule (300 mg total) by mouth at bedtime. For restless legs 90 capsule 1  . glucose blood (ACCU-CHEK AVIVA PLUS) test strip USE 1 STRIP TO CHECK GLUCOSE 4 TIMES DAILY AS DIRECTED. 400 each 3  . ibuprofen (ADVIL,MOTRIN) 200 MG tablet Take 400-800 mg by mouth every 6 (six) hours as needed (for pain or headaches).    Marland Kitchen lisinopril (PRINIVIL,ZESTRIL) 10 MG tablet Take 1 tablet (10 mg total) by mouth daily. 90 tablet 1  . metFORMIN (GLUCOPHAGE) 500 MG tablet Take 1 tablet (500 mg total) by mouth 2 (two) times daily with a meal. 180 tablet 3  . norethindrone-ethinyl estradiol (JUNEL FE,GILDESS FE,LOESTRIN FE) 1-20 MG-MCG tablet Take 1 tablet by mouth daily. 1 Package 11  . OLANZapine (ZYPREXA) 15 MG tablet Take 1 tablet (15 mg total) by mouth at bedtime. 90 tablet 0  . omega-3 acid ethyl esters (LOVAZA) 1 g capsule Take 1  capsule (1 g total) by mouth 2 (two)  times daily. 180 capsule 1  . primidone (MYSOLINE) 50 MG tablet Take 1 tablet (50 mg total) by mouth at bedtime. 90 tablet 1  . sertraline (ZOLOFT) 100 MG tablet Take 1 tablet (100 mg total) by mouth daily. 90 tablet 1  . tiZANidine (ZANAFLEX) 4 MG tablet Take 0.5-1 tablets (2-4 mg total) by mouth every 6 (six) hours as needed for muscle spasms. 30 tablet 0  . traZODone (DESYREL) 100 MG tablet Take 1 tablet (100 mg total) by mouth at bedtime. For sleep 90 tablet 1  . benztropine (COGENTIN) 1 MG tablet Take 1 tablet (1 mg total) by mouth 2 (two) times daily. 60 tablet 1  . lithium carbonate (LITHOBID) 300 MG CR tablet Take 1 tablet (300 mg total) by mouth 2 (two) times daily with a meal. 60 tablet 0   No current facility-administered medications for this visit.      Musculoskeletal: Strength & Muscle Tone: within normal limits Gait & Station: normal Patient leans: N/A  Psychiatric Specialty Exam: Review of Systems  Neurological: Positive for tremors.  Psychiatric/Behavioral: Positive for depression. The patient is nervous/anxious.   All other systems reviewed and are negative.   Blood pressure 138/89, pulse 87, temperature 99.2 F (37.3 C), temperature source Oral, weight 182 lb 9.6 oz (82.8 kg).Body mass index is 33.4 kg/m.  General Appearance: Casual  Eye Contact:  Fair  Speech:  Clear and Coherent  Volume:  Normal  Mood:  Anxious  Affect:  Appropriate  Thought Process:  Goal Directed and Descriptions of Associations: Intact  Orientation:  Full (Time, Place, and Person)  Thought Content: Logical   Suicidal Thoughts:  No  Homicidal Thoughts:  No  Memory:  Immediate;   Fair Recent;   Fair Remote;   Fair  Judgement:  Fair  Insight:  Fair  Psychomotor Activity:  Tremor  Concentration:  Concentration: Fair and Attention Span: Fair  Recall:  [employer] of Knowledge: Fair  Language: Fair  Akathisia:  No  Handed:  Right  AIMS (if indicated): 10  Assets:  Communication  Skills Desire for Improvement Social Support  ADL's:  Intact  Cognition: WNL  Sleep:  Fair   Screenings: AIMS     Office Visit from 04/07/2018 in [facility] Office Visit from 01/01/2018 in [facility] Total Score  10  1    GAD-7     Office Visit from 09/06/2017 in [facility]  Total GAD-7 Score  8    PHQ2-9     Office Visit from 02/21/2018 in [facility] from 11/19/2017 in [facility] Office Visit from 09/06/2017 in [facility] Office Visit from 03/28/2017 in [facility] Office Visit from 02/28/2017 in [place] [facility]  PHQ-2 Total Score  0  '4  4  6  6  '$ PHQ-9 Total Score  0  '17  18  25  19       '$ Assessment and Plan: Severina is a 43 yr old Caucasian female who has a history of bipolar disorder, PTSD, panic disorder, drug-induced parkinsonism, seizure disorder, diabetes melitis, polysubstance abuse, presented to clinic today for a follow-up visit.  Patient is biologically predisposed given her history of trauma as well as multiple medical problems and mental health problems in her family.  Patient continues to struggle with psychosocial stressors of financial problems, husband's health issues.  Patient continues to struggle with mood lability.  Patient will continue to need medication changes as noted below.  Plan For bipolar disorder-unstable Discontinue Depakote for lack of efficacy. Start lithium 300 mg p.o. twice daily with meals Provided her lab slip for Lithium level-  to go to [facility] in a week after starting lithium. Zoloft 100 mg p.o. daily Zyprexa 15 mg p.o. nightly  For panic attacks-stable Zoloft 100 mg p.o. daily  For PTSD- stable Zoloft 100 mg p.o. daily  For insomnia- stable Trazodone 100 mg p.o. nightly  For restless leg syndrome-improving Gabapentin 300 mg p.o. nightly  For drug-induced  Parkinson's disease-unstable [place] equals 10 Discontinue Artane for lack of benefit Start Cogentin 1 mg p.o. twice daily  For alcohol use disorder/cocaine and cannabis use disorder-improving We will continue to provide substance abuse counseling  For tobacco use disorder-unstable Patient is trying to cut down.  Provided smoking cessation counseling.  Discussed referral for psychotherapy sessions given her multiple psychosocial stressors and continued mood swings-patient however declined.  Follow-up in clinic in 2 weeks or sooner if needed.  I have spent atleast 25 minutes face to face with patient today. More than 50 % of the time was spent for psychoeducation and supportive psychotherapy and care coordination.  This note was generated in part or whole with voice recognition software. Voice recognition is usually quite accurate but there are transcription errors that can and very often do occur. I apologize for any typographical errors that were not detected and corrected.        Ursula Alert, MD 04/07/2018, 5:48 PM

## 2018-04-07 NOTE — Patient Instructions (Signed)
Benztropine tablets What is this medicine? BENZTROPINE (BENZ troe peen) is for certain movement problems due to Parkinson's disease, certain medicines, or other causes. This medicine may be used for other purposes; ask your health care provider or pharmacist if you have questions. COMMON BRAND NAME(S): Cogentin What should I tell my health care provider before I take this medicine? They need to know if you have any of these conditions: -glaucoma -heart disease or a rapid heartbeat -mental problems -prostate trouble -tardive dyskinesia -an unusual or allergic reaction to benztropine, other medicines, lactose, foods, dyes, or preservatives -pregnant or trying to get pregnant -breast-feeding How should I use this medicine? Take this medicine by mouth with a full glass of water. Follow the directions on the prescription label. Take your medicine at regular intervals. Do not take your medicine more often than directed. Talk to your pediatrician regarding the use of this medicine in children. While this drug may be prescribed for children as young as 3 years for selected conditions, precautions do apply. Overdosage: If you think you have taken too much of this medicine contact a poison control center or emergency room at once. NOTE: This medicine is only for you. Do not share this medicine with others. What if I miss a dose? If you miss a dose, take it as soon as you can. If it is almost time for your next dose, take only that dose. Do not take double or extra doses. What may interact with this medicine? -haloperidol -medicines for movement abnormalities like Parkinson's disease -phenothiazines like chlorpromazine, mesoridazine, prochlorperazine, thioridazine -some antidepressants like amitriptyline, desipramine, doxepin, nortriptyline -stimulant medicines for attention, weight loss, and to stay awake -tegaserod This list may not describe all possible interactions. Give your health care  provider a list of all the medicines, herbs, non-prescription drugs, or dietary supplements you use. Also tell them if you smoke, drink alcohol, or use illegal drugs. Some items may interact with your medicine. What should I watch for while using this medicine? Visit your doctor or health care professional for regular checks on your progress. You may get drowsy or dizzy. Do not drive, use machinery, or do anything that needs mental alertness until you know how this medicine affects you. Do not stand or sit up quickly, especially if you are an older patient. This reduces the risk of dizzy or fainting spells. Alcohol may interfere with the effect of this medicine. Avoid alcoholic drinks. Your mouth may get dry. Chewing sugarless gum or sucking hard candy, and drinking plenty of water may help. Contact your doctor if the problem does not go away or is severe. This medicine may cause dry eyes and blurred vision. If you wear contact lenses you may feel some discomfort. Lubricating drops may help. See your eye doctor if the problem does not go away or is severe. You may sweat less than usual while you are taking this medicine. As a result your body temperature could rise to a dangerous level. Be careful not to get overheated during exercise or in hot weather. You could get heat stroke. Avoid taking hot baths and using hot tubs and saunas. What side effects may I notice from receiving this medicine? Side effects that you should report to your doctor or health care professional as soon as possible: -allergic reactions like skin rash, itching or hives, swelling of the face, lips, or tongue -changes in vision -confusion -decreased sweating or heat intolerance -depression -fast, irregular heartbeat -hallucinations -memory loss -muscle weakness -pain or difficulty  passing urine -vomiting Side effects that usually do not require medical attention (report to your doctor or health care professional if they  continue or are bothersome): -constipation -dry mouth -nausea This list may not describe all possible side effects. Call your doctor for medical advice about side effects. You may report side effects to FDA at 1-800-FDA-1088. Where should I keep my medicine? Keep out of the reach of children. Store below 30 degrees C (86 degrees F). Keep container tightly closed. Throw away any unused medicine after the expiration date. NOTE: This sheet is a summary. It may not cover all possible information. If you have questions about this medicine, talk to your doctor, pharmacist, or health care provider.  2019 Elsevier/Gold Standard (2007-04-23 15:38:20) Lithium extended-release tablets What is this medicine? LITHIUM (LITH ee um) is used to prevent and treat the manic episodes caused by manic-depressive illness. This medicine may be used for other purposes; ask your health care provider or pharmacist if you have questions. COMMON BRAND NAME(S): Eskalith CR, Lithobid What should I tell my health care provider before I take this medicine? They need to know if you have any of these conditions: -active infection -breathing problems -Brugada Syndrome -dehydration (diarrhea or sweating) -diet low in salt -heart disease -high levels of calcium in the blood -history of irregular heartbeat -kidney disease -low level of potassium or sodium in the blood -parathyroid disease -problems urinating -thyroid disease -an unusual or allergic reaction to lithium, other medicines, foods, dyes, or preservatives -pregnant or trying to get pregnant -breast-feeding How should I use this medicine? Take this medicine by mouth with a glass of water. Follow the directions on the prescription label. Swallow the tablets whole. Do not break, crush or chew. Take after a meal or snack to avoid stomach upset. Take your doses at regular intervals. Do not take your medicine more often than directed. The amount of this medicine you  take is very important. Taking more than the prescribed dose can cause serious side effects. Do not stop taking except on your the advice of your doctor or health care professional. Talk to your pediatrician regarding the use of this medicine in children. Special care may be needed. While this drug may be prescribed for children as young as 12 years for selected conditions, precautions do apply. Overdosage: If you think you have taken too much of this medicine contact a poison control center or emergency room at once. NOTE: This medicine is only for you. Do not share this medicine with others. What if I miss a dose? If you miss a dose, take it as soon as you can. If it is almost time for your next dose, take only that dose. Do not take double or extra doses. What may interact with this medicine? Do not take this medicine with any of the following medications: -cisapride -dofetilide -dronedarone -pimozide -thioridazine This medicine may also interact with the following medications: -buspirone -caffeine -calcium iodide -carbamazepine -certain medicines for depression, anxiety, or psychotic disturbances -certain medicines for high blood pressure -certain medicines for migraine headache like almotriptan, eletriptan, frovatriptan, naratriptan, rizatriptan, sumatriptan, zolmitriptan -diuretics -fentanyl -linezolid -MAOIs like Carbex, Eldepryl, Marplan, Nardil, and Parnate -medicines that relax muscles for surgery -metronidazole -NSAIDs, medicines for pain and inflammation, like ibuprofen or naproxen -other medicines that prolong the QT interval (cause an abnormal heart rhythm) -phenytoin -potassium iodide -sodium bicarbonate -sodium chloride -St. John's Wort -theophylline -tramadol -tryptophan -urea This list may not describe all possible interactions. Give your health care provider  a list of all the medicines, herbs, non-prescription drugs, or dietary supplements you use. Also tell  them if you smoke, drink alcohol, or use illegal drugs. Some items may interact with your medicine. What should I watch for while using this medicine? Visit your doctor or health care professional for regular checks on your progress. It can take several weeks of treatment before you start to get better. The amount of salt (sodium) in your body influences the effects of this medicine, and this medicine can increase salt loss from the body. Eat a normal diet that includes salt. Do not change to salt substitutes. Avoid changes involving diet, or medications that include large amounts of sodium like sodium bicarbonate. Ask your doctor or health care professional for advice if you are not sure. Drink plenty of fluids while you are taking this medicine. Avoid drinks that contain caffeine, such as coffee, tea and colas. You will need extra fluids if you have diarrhea or sweat a lot. This will help prevent toxic effects from this medicine. Be careful not to get overheated during exercise, saunas, hot baths, and hot weather. Consult your doctor or health care professional if you have a high fever or persistent diarrhea. You may get drowsy or dizzy. Do not drive, use machinery, or do anything that needs mental alertness until you know how this medicine affects you. Do not stand or sit up quickly, especially if you are an older patient. This reduces the risk of dizzy or fainting spells. What side effects may I notice from receiving this medicine? Side effects that you should report to your doctor or health care professional as soon as possible: -allergic reactions like skin rash, itching or hives, swelling of the face, lips, or tongue -breathing problems -changes in emotions or moods -changes in vision -fingers or toes feel numb, cool, painful -hallucinations, loss of contact with reality -increased thirst -increased urination -mild tremor (hands) -persistent headache with or without blurred vision -signs and  symptoms of a dangerous change in heartbeat or heart rhythm like chest pain; dizziness; fast, irregular heartbeat; palpitations; feeling faint or lightheaded; falls; breathing problems -signs and symptoms of lithium toxicity like diarrhea, vomiting, increased tremor, loss of balance or coordination, drowsiness, ringing of the ears, muscle weakness or twitching, slurred speech, confusion, seizures -unusually weak or tired Side effects that usually do not require medical attention (report to your doctor or health care professional if they continue or are bothersome): -decreased appetite -mild thirst -nausea -stomach upset -tiredness This list may not describe all possible side effects. Call your doctor for medical advice about side effects. You may report side effects to FDA at 1-800-FDA-1088. Where should I keep my medicine? Keep out of the reach of children. Store at room temperature between 15 and 30 degrees C (59 and 86 degrees F). Keep container tightly closed. Protect from light. Throw away any unused medicine after the expiration date. NOTE: This sheet is a summary. It may not cover all possible information. If you have questions about this medicine, talk to your doctor, pharmacist, or health care provider.  2019 Elsevier/Gold Standard (2016-11-28 09:08:39)

## 2018-04-07 NOTE — Telephone Encounter (Signed)
I just completed it and gave it to you.

## 2018-04-14 ENCOUNTER — Telehealth: Payer: Self-pay

## 2018-04-14 NOTE — Telephone Encounter (Signed)
Please ask her to stop her old medication for tremors.

## 2018-04-14 NOTE — Telephone Encounter (Signed)
Please ask her to discontinue old medications - artane ( for tremors) .

## 2018-04-14 NOTE — Telephone Encounter (Signed)
pt called left message that she wants to know if she continues to take her old medicaion on on her tremor

## 2018-04-14 NOTE — Telephone Encounter (Signed)
pt called left another message that she needs to know if she still takes her old medication along with the new one you gave her for her tremors.

## 2018-04-15 ENCOUNTER — Encounter: Payer: Self-pay | Admitting: Nurse Practitioner

## 2018-04-15 DIAGNOSIS — M25551 Pain in right hip: Secondary | ICD-10-CM

## 2018-04-15 DIAGNOSIS — M25552 Pain in left hip: Principal | ICD-10-CM

## 2018-04-15 NOTE — Telephone Encounter (Signed)
thanks

## 2018-04-15 NOTE — Telephone Encounter (Signed)
Pt was called and given instructions to stop the old medication.

## 2018-04-18 ENCOUNTER — Ambulatory Visit
Admission: RE | Admit: 2018-04-18 | Discharge: 2018-04-18 | Disposition: A | Discharge status: Home / Self Care | Payer: Medicare Other | Source: Ambulatory Visit | Attending: Nurse Practitioner | Admitting: Nurse Practitioner

## 2018-04-18 ENCOUNTER — Other Ambulatory Visit: Payer: Self-pay | Admitting: Nurse Practitioner

## 2018-04-18 DIAGNOSIS — R05 Cough: Secondary | ICD-10-CM | POA: Insufficient documentation

## 2018-04-18 DIAGNOSIS — R059 Cough, unspecified: Secondary | ICD-10-CM

## 2018-04-18 DIAGNOSIS — M25552 Pain in left hip: Secondary | ICD-10-CM | POA: Insufficient documentation

## 2018-04-18 DIAGNOSIS — M25551 Pain in right hip: Secondary | ICD-10-CM | POA: Diagnosis present

## 2018-04-19 ENCOUNTER — Encounter: Payer: Self-pay | Admitting: Nurse Practitioner

## 2018-04-19 DIAGNOSIS — M25552 Pain in left hip: Principal | ICD-10-CM

## 2018-04-19 DIAGNOSIS — M25551 Pain in right hip: Secondary | ICD-10-CM

## 2018-04-19 DIAGNOSIS — G8929 Other chronic pain: Secondary | ICD-10-CM

## 2018-04-22 NOTE — Addendum Note (Signed)
Addended by: Cassell Smiles R on: 04/22/2018 11:41 AM   Modules accepted: Orders

## 2018-04-24 ENCOUNTER — Telehealth: Payer: Self-pay

## 2018-04-24 DIAGNOSIS — G2111 Neuroleptic induced parkinsonism: Secondary | ICD-10-CM

## 2018-04-24 DIAGNOSIS — F4310 Post-traumatic stress disorder, unspecified: Secondary | ICD-10-CM

## 2018-04-24 DIAGNOSIS — F3130 Bipolar disorder, current episode depressed, mild or moderate severity, unspecified: Secondary | ICD-10-CM

## 2018-04-24 MED ORDER — BENZTROPINE MESYLATE 1 MG PO TABS: 1.0000 mg | ORAL_TABLET | Freq: Three times a day (TID) | ORAL | 1 refills | Status: DC

## 2018-04-24 MED ORDER — OLANZAPINE 15 MG PO TABS: 7.5000 mg | ORAL_TABLET | Freq: Every day | ORAL | 0 refills | Status: DC

## 2018-04-24 NOTE — Telephone Encounter (Signed)
Medication problem - Patient called wanting to let Dr.Eappen know the new Cogentin she had prescribed her was not helping with tremors.  States these are worse since starting the Cogentin and questions if she should go up or change medication. Patient does report some dry mouth and returns 05/01/18 for next evaluation but wanted to know if Dr. Shea Evans wants to change something before then?  Agreed to send information to Dr. Shea Evans with her reported worsening of tremors since starting Cogentin 1 mg, twice a day.

## 2018-04-24 NOTE — Telephone Encounter (Signed)
Spoke to patient , discussed to increase cogentin to 1 mg tid. Discussed to decrease Zyprexa to 7.5 mg po qhs. She will use Biotene mouthwash for dry mouth. She will return soon for follow up.

## 2018-04-25 ENCOUNTER — Other Ambulatory Visit
Admission: RE | Admit: 2018-04-25 | Discharge: 2018-04-25 | Disposition: A | Discharge status: Home / Self Care | Payer: Medicare Other | Source: Ambulatory Visit | Attending: Psychiatry | Admitting: Psychiatry

## 2018-04-25 DIAGNOSIS — F3130 Bipolar disorder, current episode depressed, mild or moderate severity, unspecified: Secondary | ICD-10-CM | POA: Diagnosis present

## 2018-04-25 LAB — BASIC METABOLIC PANEL
Anion gap: 9 (ref 5–15)
BUN: 10 mg/dL (ref 6–20)
CO2: 23 mmol/L (ref 22–32)
Calcium: 9 mg/dL (ref 8.9–10.3)
Chloride: 104 mmol/L (ref 98–111)
Creatinine, Ser: 0.61 mg/dL (ref 0.44–1.00)
GFR calc Af Amer: >60 mL/min (ref >60)
GFR calc non Af Amer: >60 mL/min (ref >60)
Glucose, Bld: 154 mg/dL — ABNORMAL HIGH (ref 70–99)
Potassium: 3.6 mmol/L (ref 3.5–5.1)
Sodium: 136 mmol/L (ref 135–145)

## 2018-04-25 LAB — LITHIUM LEVEL: Lithium Lvl: 0.32 mmol/L — ABNORMAL LOW (ref 0.60–1.20)

## 2018-04-28 ENCOUNTER — Ambulatory Visit: Payer: Self-pay | Admitting: Psychiatry

## 2018-05-01 ENCOUNTER — Other Ambulatory Visit: Payer: Self-pay

## 2018-05-01 ENCOUNTER — Ambulatory Visit (INDEPENDENT_AMBULATORY_CARE_PROVIDER_SITE_OTHER): Payer: Medicare Other | Admitting: Psychiatry

## 2018-05-01 NOTE — Progress Notes (Deleted)
[facility] MD OP Progress Note  05/01/2018 4:00 PM Vanessa Baker  MRN:  [id number]  Chief Complaint:  HPI: *** Visit Diagnosis: No diagnosis found.  Past Psychiatric History: ***  Past Medical History:  Past Medical History:  Diagnosis Date  . Allergy   . Anxiety   . Asthma   . Bipolar disorder ([place])   . Bursitis   . Depression   . Diabetes mellitus ([place]) 09/2017  . Diabetes mellitus, type II ([place])   . Essential hypertension 03/28/2017  . Frequent headaches   . GERD (gastroesophageal reflux disease)   . Headache   . High cholesterol   . Hypertension   . Irritable bowel syndrome (IBS)   . Irritable colon 02/23/2010  . Migraine 04/02/2017  . Obesity (BMI 30-39.9) 11/25/2013  . Seizure ([place]) [phone]  . Seizures ([place])   . Tremor     Past Surgical History:  Procedure Laterality Date  . NO PAST SURGERIES      Family Psychiatric History: ***  Family History:  Family History  Problem Relation Age of Onset  . Bipolar disorder Mother   . Schizophrenia Mother   . Hypertension Mother   . Diabetes Mother   . Cancer Mother   . Anxiety disorder Mother   . ADD / ADHD Mother   . Alcohol abuse Mother   . Drug abuse Mother   . Bipolar disorder Sister   . Anxiety disorder Sister   . Drug abuse Sister   . Bipolar disorder Brother   . Anxiety disorder Brother   . Drug abuse Brother   . Hypertension Father   . Cancer Father   . Breast cancer Maternal Aunt   . Breast cancer Paternal Aunt   . Breast cancer Maternal Grandmother   . Alcohol abuse Maternal Grandmother   . Breast cancer Paternal Grandmother   . Parkinson's disease Maternal Grandfather   . Alcohol abuse Maternal Grandfather   . Alcohol abuse Maternal Uncle   . ADD / ADHD Son   . Tremor Neg Hx     Social History:  Social History   Socioeconomic History  . Marital status: Married    Spouse name: dewey   . Number of children: 1  . Years of education: 46  . Highest education level: Associate degree:  occupational, Hotel manager, or vocational program  Occupational History  . Occupation: disability  Social Needs  . Financial resource strain: Very hard  . Food insecurity:    Worry: Often true    Inability: Often true  . Transportation needs:    Medical: No    Non-medical: No  Tobacco Use  . Smoking status: Current Every Day Smoker    Packs/day: 0.50    Years: 27.00    Pack years: 13.50    Types: Cigarettes  . Smokeless tobacco: Current User  Substance and Sexual Activity  . Alcohol use: Not Currently  . Drug use: No  . Sexual activity: Yes    Partners: Male    Birth control/protection: None  Lifestyle  . Physical activity:    Days per week: 0 days    Minutes per session: 0 min  . Stress: Very much  Relationships  . Social connections:    Talks on phone: Never    Gets together: Never    Attends religious service: Never    Active member of club or organization: No    Attends meetings of clubs or organizations: Never    Relationship status: Married  Other Topics Concern  .  Not on file  Social History Narrative   Lives with husband, [name] (on [place])   Right-handed   Caffeine use: 1 cup coffee a day, 2 soft drinks per day    Allergies:  Allergies  Allergen Reactions  . Amitriptyline Other (See Comments)    Confusion, paralysis    Metabolic Disorder Labs: Lab Results  Component Value Date   HGBA1C 6.2 (A) 02/21/2018   MPG 128 11/19/2017   MPG 137 08/13/2017   Lab Results  Component Value Date   PROLACTIN 4.6 (L) 04/22/2017   PROLACTIN 10.2 11/10/2015   Lab Results  Component Value Date   CHOL 149 03/05/2018   TRIG 637 (H) 03/05/2018   HDL 26 (L) 03/05/2018   CHOLHDL 5.7 (H) 03/05/2018   [place]  03/05/2018     Comment:     . LDL cholesterol not calculated. Triglyceride levels greater than 400 mg/dL invalidate calculated LDL results. . Reference range: <100 . Desirable range <100 mg/dL for primary prevention;   <70 mg/dL for patients  with CHD or diabetic patients  with > or = 2 CHD risk factors. Marland Kitchen LDL-C is now calculated using the Martin-Hopkins  calculation, which is a validated novel method providing  better accuracy than the Friedewald equation in the  estimation of LDL-C.  Cresenciano Genre et al. Annamaria Helling. 1610;960(45): 2061-2068  (http://education.Quest[employer].com/faq/FAQ164)    LDLCALC 81 11/19/2017   Lab Results  Component Value Date   TSH 2.08 11/19/2017   TSH 2.130 10/25/2016    Therapeutic Level Labs: Lab Results  Component Value Date   LITHIUM 0.32 (L) 04/25/2018   Lab Results  Component Value Date   VALPROATE 61.4 08/13/2017   VALPROATE <10 (L) 04/22/2017   No components found for:  CBMZ  Current Medications: Current Outpatient Medications  Medication Sig Dispense Refill  . ACCU-CHEK SOFTCLIX LANCETS lancets by Other route. Use as instructed    . acetaminophen (TYLENOL) 500 MG tablet Take 500 mg by mouth every 6 (six) hours as needed.    Marland Kitchen albuterol (PROAIR HFA) 108 (90 Base) MCG/ACT inhaler Inhale 2 puffs into the lungs every 4 (four) hours as needed for wheezing or shortness of breath. 8 g 5  . atorvastatin (LIPITOR) 40 MG tablet Take 1 tablet (40 mg total) by mouth daily. 90 tablet 3  . benzonatate (TESSALON) 100 MG capsule Take 1 capsule (100 mg total) by mouth 2 (two) times daily as needed for cough. 30 capsule 0  . benztropine (COGENTIN) 1 MG tablet Take 1 tablet (1 mg total) by mouth 3 (three) times daily. 60 tablet 1  . blood glucose meter kit and supplies Dispense based on patient and insurance preference. Use up to four times daily as directed. (FOR ICD-10 E10.9, E11.9). 1 each 0  . budesonide-formoterol (SYMBICORT) 160-4.5 MCG/ACT inhaler Inhale 2 puffs into the lungs 2 (two) times daily. 1 Inhaler 3  . Calcium Carb-Cholecalciferol (CALCIUM 600+D3 PO) Take 1 tablet by mouth daily.    . Dulaglutide (TRULICITY) 1.5 WU/9.8JX SOPN Inject 1.5 mg into the skin every 7 (seven) days. 4 pen 12  .  esomeprazole (NEXIUM) 20 MG capsule Take 1 capsule (20 mg total) by mouth daily at 12 noon. 90 capsule 3  . Fluticasone-Umeclidin-Vilant (TRELEGY ELLIPTA) 100-62.5-25 MCG/INH AEPB Inhale 1 puff into the lungs daily. 30 each 5  . gabapentin (NEURONTIN) 300 MG capsule Take 1 capsule (300 mg total) by mouth at bedtime. For restless legs 90 capsule 1  . glucose blood (ACCU-CHEK  AVIVA PLUS) test strip USE 1 STRIP TO CHECK GLUCOSE 4 TIMES DAILY AS DIRECTED. 400 each 3  . ibuprofen (ADVIL,MOTRIN) 200 MG tablet Take 400-800 mg by mouth every 6 (six) hours as needed (for pain or headaches).    Marland Kitchen lisinopril (PRINIVIL,ZESTRIL) 10 MG tablet Take 1 tablet (10 mg total) by mouth daily. 90 tablet 1  . lithium carbonate (LITHOBID) 300 MG CR tablet Take 1 tablet (300 mg total) by mouth 2 (two) times daily with a meal. 60 tablet 0  . metFORMIN (GLUCOPHAGE) 500 MG tablet Take 1 tablet (500 mg total) by mouth 2 (two) times daily with a meal. 180 tablet 3  . norethindrone-ethinyl estradiol (JUNEL FE,GILDESS FE,LOESTRIN FE) 1-20 MG-MCG tablet Take 1 tablet by mouth daily. 1 Package 11  . OLANZapine (ZYPREXA) 15 MG tablet Take 0.5 tablets (7.5 mg total) by mouth at bedtime. 90 tablet 0  . omega-3 acid ethyl esters (LOVAZA) 1 g capsule Take 1 capsule (1 g total) by mouth 2 (two) times daily. 180 capsule 1  . primidone (MYSOLINE) 50 MG tablet Take 1 tablet (50 mg total) by mouth at bedtime. 90 tablet 1  . sertraline (ZOLOFT) 100 MG tablet Take 1 tablet (100 mg total) by mouth daily. 90 tablet 1  . tiZANidine (ZANAFLEX) 4 MG tablet Take 0.5-1 tablets (2-4 mg total) by mouth every 6 (six) hours as needed for muscle spasms. 30 tablet 0  . traZODone (DESYREL) 100 MG tablet Take 1 tablet (100 mg total) by mouth at bedtime. For sleep 90 tablet 1   No current facility-administered medications for this visit.      Musculoskeletal: Strength & Muscle Tone: {desc; muscle tone:32375} Gait & Station: {PE GAIT ED  KXFG:18299} Patient leans: {Patient Leans:21022755}  Psychiatric Specialty Exam: ROS  There were no vitals taken for this visit.There is no height or weight on file to calculate BMI.  General Appearance: {Appearance:22683}  Eye Contact:  {BHH EYE CONTACT:22684}  Speech:  {Speech:22685}  Volume:  {Volume (PAA):22686}  Mood:  {BHH MOOD:22306}  Affect:  {Affect (PAA):22687}  Thought Process:  {Thought Process (PAA):22688}  Orientation:  {BHH ORIENTATION (PAA):22689}  Thought Content: {Thought Content:22690}   Suicidal Thoughts:  {ST/HT (PAA):22692}  Homicidal Thoughts:  {ST/HT (PAA):22692}  Memory:  {BHH MEMORY:22881}  Judgement:  {Judgement (PAA):22694}  Insight:  {Insight (PAA):22695}  Psychomotor Activity:  {Psychomotor (PAA):22696}  Concentration:  {Concentration:21399}  Recall:  {BHH GOOD/FAIR/POOR:22877}  Fund of Knowledge: {BHH GOOD/FAIR/POOR:22877}  Language: {BHH GOOD/FAIR/POOR:22877}  Akathisia:  {BHH YES OR NO:22294}  Handed:  {Handed:22697}  AIMS (if indicated): {Desc; done/not:10129}  Assets:  {Assets (PAA):22698}  ADL's:  {BHH BZJ'I:96789}  Cognition: {chl bhh cognition:304700322}  Sleep:  {BHH GOOD/FAIR/POOR:22877}   Screenings: AIMS     Office Visit from 04/07/2018 in [facility] Office Visit from 01/01/2018 in [facility] Total Score  10  1    GAD-7     Office Visit from 09/06/2017 in [facility]  Total GAD-7 Score  8    PHQ2-9     Office Visit from 02/21/2018 in [facility] from 11/19/2017 in [facility] Office Visit from 09/06/2017 in [facility] Office Visit from 03/28/2017 in [facility] Office Visit from 02/28/2017 in [facility]  PHQ-2 Total Score  0  '4  4  6  6  '$ PHQ-9 Total Score  0  '17  18  25  '$ 19  Assessment and Plan: ***   Ursula Alert, MD 05/01/2018, 4:00 PM

## 2018-05-05 NOTE — Patient Instructions (Signed)
Patient not seen.

## 2018-05-06 ENCOUNTER — Other Ambulatory Visit: Payer: Self-pay | Admitting: Nurse Practitioner

## 2018-05-06 ENCOUNTER — Encounter: Payer: Self-pay | Admitting: Psychiatry

## 2018-05-06 ENCOUNTER — Ambulatory Visit (INDEPENDENT_AMBULATORY_CARE_PROVIDER_SITE_OTHER): Payer: Medicare Other | Admitting: Psychiatry

## 2018-05-06 ENCOUNTER — Other Ambulatory Visit: Payer: Self-pay

## 2018-05-06 DIAGNOSIS — F3130 Bipolar disorder, current episode depressed, mild or moderate severity, unspecified: Secondary | ICD-10-CM | POA: Diagnosis not present

## 2018-05-06 DIAGNOSIS — M62830 Muscle spasm of back: Secondary | ICD-10-CM

## 2018-05-06 DIAGNOSIS — F1010 Alcohol abuse, uncomplicated: Secondary | ICD-10-CM

## 2018-05-06 DIAGNOSIS — F4310 Post-traumatic stress disorder, unspecified: Secondary | ICD-10-CM

## 2018-05-06 DIAGNOSIS — F1411 Cocaine abuse, in remission: Secondary | ICD-10-CM | POA: Diagnosis not present

## 2018-05-06 DIAGNOSIS — G2111 Neuroleptic induced parkinsonism: Secondary | ICD-10-CM | POA: Diagnosis not present

## 2018-05-06 DIAGNOSIS — F1211 Cannabis abuse, in remission: Secondary | ICD-10-CM

## 2018-05-06 DIAGNOSIS — F17200 Nicotine dependence, unspecified, uncomplicated: Secondary | ICD-10-CM

## 2018-05-06 MED ORDER — LITHIUM CARBONATE ER 300 MG PO TBCR: 300.0000 mg | EXTENDED_RELEASE_TABLET | Freq: Two times a day (BID) | ORAL | 0 refills | Status: DC

## 2018-05-06 MED ORDER — OLANZAPINE 15 MG PO TABS: 15.0000 mg | ORAL_TABLET | Freq: Every day | ORAL | 0 refills | Status: DC

## 2018-05-06 NOTE — Progress Notes (Signed)
Virtual Visit via Telephone Note  I connected with Vanessa Baker on 05/06/18 at 10:45 AM EDT by telephone and verified that I am speaking with the correct person using two identifiers.   I discussed the limitations, risks, security and privacy concerns of performing an evaluation and management service by telephone and the availability of in person appointments. I also discussed with the patient that there may be a patient responsible charge related to this service. The patient expressed understanding and agreed to proceed.  I discussed the assessment and treatment plan with the patient. The patient was provided an opportunity to ask questions and all were answered. The patient agreed with the plan and demonstrated an understanding of the instructions.   The patient was advised to call back or seek an in-person evaluation if the symptoms worsen or if the condition fails to improve as anticipated.  I provided 15 minutes of non-face-to-face time during this encounter.   Ursula Alert, MD  [facility] MD OP Progress Note  05/06/2018 1:29 PM Vanessa Baker  MRN:  [id number]  Chief Complaint:  Chief Complaint    Follow-up     HPI: Vanessa Baker is a 43 year old Caucasian female, divorced, lives in [place], has a history of bipolar disorder, PTSD, drug-induced Parkinson's disease, alcohol use disorder, cocaine use disorder, cannabis use disorder, tobacco use disorder, hyperlipidemia, seizure disorder, IBS, was evaluated by phone today.  Patient today reports she is anxious about the coronavirus outbreak.  She reports she is following all precautions to make sure she and her husband does not get it.  She is staying indoors most of the time.  Patient reports she did not reduce the dosage of Zyprexa as discussed on the phone previously.  She reports she was confused about it and hence decided to take the same dosage.  She however reports by going up on the Cogentin to a higher dose it her  tremors have improved.  Patient denies any significant mood symptoms.  She is tolerating the lithium well.  Discussed with her the risk of being on lithium, drug interaction with lisinopril, ibuprofen.  Also discussed with her to stay well-hydrated.  Discussed lithium levels with patient- lithium level on 04/25/2018-0.32-subtherapeutic.  However since patient is doing well will not readjust her dosage today.  Patient denies any suicidality, homicidality or perceptual disturbances.  Patient continues to smoke cigarette and reports she is not ready to quit.   Visit Diagnosis:    ICD-10-CM   1. Bipolar I disorder, most recent episode depressed (HCC) F31.30 OLANZapine (ZYPREXA) 15 MG tablet    lithium carbonate (LITHOBID) 300 MG CR tablet  2. PTSD (post-traumatic stress disorder) F43.10 OLANZapine (ZYPREXA) 15 MG tablet  3. Neuroleptic induced Parkinsonism ([place]) G21.11   4. Cocaine use disorder, mild, in early remission ([place]) F14.11   5. Alcohol use disorder, mild, abuse F10.10   6. Cannabis use disorder, mild, in early remission F12.11   7. Tobacco use disorder F17.200     Past Psychiatric History: Reviewed past psychiatric history from my progress note on 04/02/2017.  Past trials of Seroquel, Latuda, Ambien, clonazepam, Topamax, Lunesta, gabapentin, Trintellix, Abilify, Rexulti,artane  Past Medical History:  Past Medical History:  Diagnosis Date  . Allergy   . Anxiety   . Asthma   . Bipolar disorder ([place])   . Bursitis   . Depression   . Diabetes mellitus ([place]) 09/2017  . Diabetes mellitus, type II ([place])   . Essential hypertension 03/28/2017  . Frequent headaches   .  GERD (gastroesophageal reflux disease)   . Headache   . High cholesterol   . Hypertension   . Irritable bowel syndrome (IBS)   . Irritable colon 02/23/2010  . Migraine 04/02/2017  . Obesity (BMI 30-39.9) 11/25/2013  . Seizure ([place]) [phone]  . Seizures ([place])   . Tremor     Past Surgical History:  Procedure  Laterality Date  . NO PAST SURGERIES      Family Psychiatric History: I have reviewed family psychiatric history from my progress note on 04/02/2017.  Family History:  Family History  Problem Relation Age of Onset  . Bipolar disorder Mother   . Schizophrenia Mother   . Hypertension Mother   . Diabetes Mother   . Cancer Mother   . Anxiety disorder Mother   . ADD / ADHD Mother   . Alcohol abuse Mother   . Drug abuse Mother   . Bipolar disorder Sister   . Anxiety disorder Sister   . Drug abuse Sister   . Bipolar disorder Brother   . Anxiety disorder Brother   . Drug abuse Brother   . Hypertension Father   . Cancer Father   . Breast cancer Maternal Aunt   . Breast cancer Paternal Aunt   . Breast cancer Maternal Grandmother   . Alcohol abuse Maternal Grandmother   . Breast cancer Paternal Grandmother   . Parkinson's disease Maternal Grandfather   . Alcohol abuse Maternal Grandfather   . Alcohol abuse Maternal Uncle   . ADD / ADHD Son   . Tremor Neg Hx     Social History: Reviewed social history from my progress note on 04/02/2017. Social History   Socioeconomic History  . Marital status: Married    Spouse name: dewey   . Number of children: 1  . Years of education: 52  . Highest education level: Associate degree: occupational, Hotel manager, or vocational program  Occupational History  . Occupation: disability  Social Needs  . Financial resource strain: Very hard  . Food insecurity:    Worry: Often true    Inability: Often true  . Transportation needs:    Medical: No    Non-medical: No  Tobacco Use  . Smoking status: Current Every Day Smoker    Packs/day: 0.50    Years: 27.00    Pack years: 13.50    Types: Cigarettes  . Smokeless tobacco: Current User  Substance and Sexual Activity  . Alcohol use: Not Currently  . Drug use: No  . Sexual activity: Yes    Partners: Male    Birth control/protection: None  Lifestyle  . Physical activity:    Days per week: 0  days    Minutes per session: 0 min  . Stress: Very much  Relationships  . Social connections:    Talks on phone: Never    Gets together: Never    Attends religious service: Never    Active member of club or organization: No    Attends meetings of clubs or organizations: Never    Relationship status: Married  Other Topics Concern  . Not on file  Social History Narrative   Lives with husband, [name] (on [place])   Right-handed   Caffeine use: 1 cup coffee a day, 2 soft drinks per day    Allergies:  Allergies  Allergen Reactions  . Amitriptyline Other (See Comments)    Confusion, paralysis    Metabolic Disorder Labs: Lab Results  Component Value Date   HGBA1C 6.2 (A) 02/21/2018  MPG 128 11/19/2017   MPG 137 08/13/2017   Lab Results  Component Value Date   PROLACTIN 4.6 (L) 04/22/2017   PROLACTIN 10.2 11/10/2015   Lab Results  Component Value Date   CHOL 149 03/05/2018   TRIG 637 (H) 03/05/2018   HDL 26 (L) 03/05/2018   CHOLHDL 5.7 (H) 03/05/2018   LDLCALC  03/05/2018     Comment:     . LDL cholesterol not calculated. Triglyceride levels greater than 400 mg/dL invalidate calculated LDL results. . Reference range: <100 . Desirable range <100 mg/dL for primary prevention;   <70 mg/dL for patients with CHD or diabetic patients  with > or = 2 CHD risk factors. Marland Kitchen LDL-C is now calculated using the Martin-Hopkins  calculation, which is a validated novel method providing  better accuracy than the Friedewald equation in the  estimation of LDL-C.  Cresenciano Genre et al. Annamaria Helling. 1448;185(63): 2061-2068  (http://education.Quest[employer].com/faq/FAQ164)    LDLCALC 81 11/19/2017   Lab Results  Component Value Date   TSH 2.08 11/19/2017   TSH 2.130 10/25/2016    Therapeutic Level Labs: Lab Results  Component Value Date   LITHIUM 0.32 (L) 04/25/2018   Lab Results  Component Value Date   VALPROATE 61.4 08/13/2017   VALPROATE <10 (L) 04/22/2017   No  components found for:  CBMZ  Current Medications: Current Outpatient Medications  Medication Sig Dispense Refill  . ACCU-CHEK SOFTCLIX LANCETS lancets by Other route. Use as instructed    . acetaminophen (TYLENOL) 500 MG tablet Take 500 mg by mouth every 6 (six) hours as needed.    Marland Kitchen albuterol (PROAIR HFA) 108 (90 Base) MCG/ACT inhaler Inhale 2 puffs into the lungs every 4 (four) hours as needed for wheezing or shortness of breath. 8 g 5  . atorvastatin (LIPITOR) 40 MG tablet Take 1 tablet (40 mg total) by mouth daily. 90 tablet 3  . baclofen (LIORESAL) 10 MG tablet     . benzonatate (TESSALON) 100 MG capsule Take 1 capsule (100 mg total) by mouth 2 (two) times daily as needed for cough. 30 capsule 0  . benztropine (COGENTIN) 1 MG tablet Take 1 tablet (1 mg total) by mouth 3 (three) times daily. 60 tablet 1  . blood glucose meter kit and supplies Dispense based on patient and insurance preference. Use up to four times daily as directed. (FOR ICD-10 E10.9, E11.9). 1 each 0  . budesonide-formoterol (SYMBICORT) 160-4.5 MCG/ACT inhaler Inhale 2 puffs into the lungs 2 (two) times daily. 1 Inhaler 3  . Calcium Carb-Cholecalciferol (CALCIUM 600+D3 PO) Take 1 tablet by mouth daily.    . Dulaglutide (TRULICITY) 1.5 JS/9.7WY SOPN Inject 1.5 mg into the skin every 7 (seven) days. 4 pen 12  . esomeprazole (NEXIUM) 20 MG capsule Take 1 capsule (20 mg total) by mouth daily at 12 noon. 90 capsule 3  . Fluticasone-Umeclidin-Vilant (TRELEGY ELLIPTA) 100-62.5-25 MCG/INH AEPB Inhale 1 puff into the lungs daily. 30 each 5  . gabapentin (NEURONTIN) 300 MG capsule Take 1 capsule (300 mg total) by mouth at bedtime. For restless legs 90 capsule 1  . glucose blood (ACCU-CHEK AVIVA PLUS) test strip USE 1 STRIP TO CHECK GLUCOSE 4 TIMES DAILY AS DIRECTED. 400 each 3  . ibuprofen (ADVIL,MOTRIN) 200 MG tablet Take 400-800 mg by mouth every 6 (six) hours as needed (for pain or headaches).    Marland Kitchen lisinopril (PRINIVIL,ZESTRIL) 10  MG tablet Take 1 tablet (10 mg total) by mouth daily. 90 tablet 1  . lithium  carbonate (LITHOBID) 300 MG CR tablet Take 1 tablet (300 mg total) by mouth 2 (two) times daily with a meal. 180 tablet 0  . metFORMIN (GLUCOPHAGE) 500 MG tablet Take 1 tablet (500 mg total) by mouth 2 (two) times daily with a meal. 180 tablet 3  . norethindrone-ethinyl estradiol (JUNEL FE,GILDESS FE,LOESTRIN FE) 1-20 MG-MCG tablet Take 1 tablet by mouth daily. 1 Package 11  . OLANZapine (ZYPREXA) 15 MG tablet Take 1 tablet (15 mg total) by mouth at bedtime. 90 tablet 0  . omega-3 acid ethyl esters (LOVAZA) 1 g capsule Take 1 capsule (1 g total) by mouth 2 (two) times daily. 180 capsule 1  . primidone (MYSOLINE) 50 MG tablet Take 1 tablet (50 mg total) by mouth at bedtime. 90 tablet 1  . sertraline (ZOLOFT) 100 MG tablet Take 1 tablet (100 mg total) by mouth daily. 90 tablet 1  . tiZANidine (ZANAFLEX) 4 MG tablet Take 0.5-1 tablets (2-4 mg total) by mouth every 6 (six) hours as needed for muscle spasms. 30 tablet 0  . traZODone (DESYREL) 100 MG tablet Take 1 tablet (100 mg total) by mouth at bedtime. For sleep 90 tablet 1   No current facility-administered medications for this visit.      Musculoskeletal: Strength & Muscle Tone: UTA Gait & Station: UTA Patient leans: N/A  Psychiatric Specialty Exam: Review of Systems  Psychiatric/Behavioral: The patient is nervous/anxious.   All other systems reviewed and are negative.   There were no vitals taken for this visit.There is no height or weight on file to calculate BMI.  General Appearance: UTA  Eye Contact:  UTA  Speech:  Clear and Coherent  Volume:  Normal  Mood:  Anxious  Affect:  UTA  Thought Process:  Goal Directed and Descriptions of Associations: Intact  Orientation:  Full (Time, Place, and Person)  Thought Content: Logical   Suicidal Thoughts:  No  Homicidal Thoughts:  No  Memory:  Immediate;   Fair Recent;   Fair Remote;   Fair  Judgement:  Fair   Insight:  Fair  Psychomotor Activity:  UTA  Concentration:  Concentration: Fair and Attention Span: Fair  Recall:  [employer] of Knowledge: Fair  Language: Fair  Akathisia:  No  Handed:  Right  AIMS (if indicated): UTA  Assets:  Communication Skills Desire for Improvement Social Support  ADL's:  Intact  Cognition: WNL  Sleep:  Fair   Screenings: AIMS     Office Visit from 04/07/2018 in [place] Office Visit from 01/01/2018 in [place] Total Score  10  1    GAD-7     Office Visit from 09/06/2017 in [facility]  Total GAD-7 Score  8    PHQ2-9     Office Visit from 02/21/2018 in [place] from 11/19/2017 in [facility] Office Visit from 09/06/2017 in [facility] Office Visit from 03/28/2017 in [facility] Office Visit from 02/28/2017 in [place]  PHQ-2 Total Score  0  _0 PHQ-9 Total Score  0  _1 Assessment and Plan: Vanessa Baker is a 43 year old Caucasian female who has a history of bipolar disorder, PTSD, panic disorder, drug-induced parkinsonism, seizure disorder, diabetes melitis, polysubstance abuse, was evaluated by phone today.  Patient is biologically predisposed given her history  of trauma as well as multiple medical problems, mental health problems in her family.  Patient reports she is making progress on the current medication regimen.  We will continue plan as noted below.  Plan For bipolar disorder-improving Lithium 300 mg p.o. twice daily with meals. Lithium level on 04/25/2018- discussed and reviewed with patient- 0.32-subtherapeutic.  However will not increase the dosage since she is doing well. Zoloft 100 mg p.o. daily Zyprexa 15 mg p.o. nightly  For panic attacks-improving Zoloft 100 mg p.o. daily.  For PTSD-stable Zoloft 100 mg p.o. daily.  For  insomnia-improving Trazodone 100 mg p.o. nightly  For RLS-improving Gabapentin 300 mg p.o. nightly  For drug-induced Parkinson's disease-improving Unable to do AIMS, however patient reports her movement problems have improved. Cogentin 1 mg p.o. tid.  Alcohol use disorder/cocaine and cannabis use disorder-improving She will continue to stay away.  Provided substance abuse counseling.  For tobacco use disorder-unstable She is not ready to quit.  Provided smoking cessation counseling.  Follow-up in clinic in 1 to 2 months or sooner if needed.  I have spent atleast 15 minutes non face to face with patient today. More than 50 % of the time was spent for psychoeducation and supportive psychotherapy and care coordination.  This note was generated in part or whole with voice recognition software. Voice recognition is usually quite accurate but there are transcription errors that can and very often do occur. I apologize for any typographical errors that were not detected and corrected.        Ursula Alert, MD 05/06/2018, 1:29 PM

## 2018-05-13 ENCOUNTER — Telehealth: Payer: Self-pay

## 2018-05-13 DIAGNOSIS — F3130 Bipolar disorder, current episode depressed, mild or moderate severity, unspecified: Secondary | ICD-10-CM

## 2018-05-13 DIAGNOSIS — F4310 Post-traumatic stress disorder, unspecified: Secondary | ICD-10-CM

## 2018-05-13 MED ORDER — OLANZAPINE 15 MG PO TABS: 7.5000 mg | ORAL_TABLET | Freq: Every day | ORAL | 0 refills | Status: DC

## 2018-05-13 NOTE — Telephone Encounter (Signed)
pt  called states that with the lithium she stays so tired.  Wants to know if she can do something else.

## 2018-05-13 NOTE — Telephone Encounter (Signed)
Returned call to patient. Patient having some side effects to Lithium.Reports she sleeps all day. Discussed that it may not be just Lithium. She is on Zyprexa 15 mg - advised to take half a pill - 7.5 mg . After a few days if she still has sleepiness during day, advised to take Lithium as a one time dose with supper.

## 2018-05-15 ENCOUNTER — Other Ambulatory Visit: Payer: Self-pay | Admitting: Psychiatry

## 2018-05-15 ENCOUNTER — Other Ambulatory Visit: Payer: Self-pay | Admitting: Nurse Practitioner

## 2018-05-15 DIAGNOSIS — M62830 Muscle spasm of back: Secondary | ICD-10-CM

## 2018-05-15 DIAGNOSIS — F3130 Bipolar disorder, current episode depressed, mild or moderate severity, unspecified: Secondary | ICD-10-CM

## 2018-05-23 ENCOUNTER — Other Ambulatory Visit: Payer: Self-pay | Admitting: Nurse Practitioner

## 2018-05-23 DIAGNOSIS — M62830 Muscle spasm of back: Secondary | ICD-10-CM

## 2018-05-26 ENCOUNTER — Telehealth: Payer: Self-pay

## 2018-05-26 DIAGNOSIS — G2111 Neuroleptic induced parkinsonism: Secondary | ICD-10-CM

## 2018-05-26 MED ORDER — BENZTROPINE MESYLATE 1 MG PO TABS: 1.0000 mg | ORAL_TABLET | Freq: Three times a day (TID) | ORAL | 2 refills | Status: DC

## 2018-05-26 NOTE — Telephone Encounter (Signed)
Sent cogentin to pharmacy

## 2018-05-26 NOTE — Telephone Encounter (Signed)
Pt called states she needs refills on her medications    benztropine (COGENTIN) 1 MG tablet  Medication  Date: 04/24/2018 Department: [facility] Ordering/Authorizing: Ursula Alert, MD  Order Providers   Prescribing Provider Encounter Provider  Ursula Alert, MD Watt Climes, RN  Outpatient Medication Detail    Disp Refills Start End   benztropine (COGENTIN) 1 MG tablet 60 tablet 1 04/24/2018    Sig - Route: Take 1 tablet (1 mg total) by mouth 3 (three) times daily. - Oral   Class: No Print

## 2018-05-29 ENCOUNTER — Encounter: Payer: Self-pay | Admitting: Nurse Practitioner

## 2018-05-29 ENCOUNTER — Other Ambulatory Visit: Payer: Self-pay | Admitting: Nurse Practitioner

## 2018-05-29 DIAGNOSIS — M62830 Muscle spasm of back: Secondary | ICD-10-CM

## 2018-06-02 ENCOUNTER — Ambulatory Visit: Payer: Medicare Other | Admitting: Nurse Practitioner

## 2018-06-02 ENCOUNTER — Ambulatory Visit: Payer: Medicare Other | Admitting: Psychiatry

## 2018-06-03 ENCOUNTER — Ambulatory Visit: Payer: Medicare Other | Admitting: Nurse Practitioner

## 2018-06-07 ENCOUNTER — Encounter: Payer: Self-pay | Admitting: Nurse Practitioner

## 2018-06-09 ENCOUNTER — Other Ambulatory Visit: Payer: Self-pay

## 2018-06-09 ENCOUNTER — Encounter: Payer: Self-pay | Admitting: Family Medicine

## 2018-06-09 ENCOUNTER — Ambulatory Visit (INDEPENDENT_AMBULATORY_CARE_PROVIDER_SITE_OTHER): Payer: Medicare HMO | Admitting: Family Medicine

## 2018-06-09 ENCOUNTER — Telehealth: Payer: Self-pay

## 2018-06-09 DIAGNOSIS — R05 Cough: Secondary | ICD-10-CM | POA: Diagnosis not present

## 2018-06-09 DIAGNOSIS — J029 Acute pharyngitis, unspecified: Secondary | ICD-10-CM | POA: Diagnosis not present

## 2018-06-09 DIAGNOSIS — R059 Cough, unspecified: Secondary | ICD-10-CM

## 2018-06-09 DIAGNOSIS — F4310 Post-traumatic stress disorder, unspecified: Secondary | ICD-10-CM

## 2018-06-09 DIAGNOSIS — F3130 Bipolar disorder, current episode depressed, mild or moderate severity, unspecified: Secondary | ICD-10-CM

## 2018-06-09 MED ORDER — FLUTICASONE PROPIONATE 50 MCG/ACT NA SUSP: 2.0000 | Freq: Every day | NASAL | 3 refills | Status: DC

## 2018-06-09 MED ORDER — AMOXICILLIN 500 MG PO CAPS: 500.0000 mg | ORAL_CAPSULE | Freq: Two times a day (BID) | ORAL | 0 refills | Status: DC

## 2018-06-09 MED ORDER — BENZONATATE 100 MG PO CAPS: 100.0000 mg | ORAL_CAPSULE | Freq: Two times a day (BID) | ORAL | 0 refills | Status: DC | PRN

## 2018-06-09 NOTE — Patient Instructions (Addendum)
You may have Throat Pharyngitis, rapid strep swab test was POSITIVE today - Take antibiotic Amoxicillin 500mg  twice a day for 10 days, finish complete course  We will cover you for Strep Throat with antibiotics, take Amoxicillin 500mg  twice a day for 10 days. We will send swab for a culture, and notify you next week if we need to change antibiotics.  Start Tessalon Perls take 1 capsule up to 3 times a day as needed for cough  Start nasal steroid Flonase 2 sprays in each nostril daily for 4-6 weeks, may repeat course seasonally or as needed  - Take regular NSAID with either Aleve 1-2 pills twice a day OR Ibuprofen 400 to 600mg  per dose with food every 6-8 hours or 3 times a day for next 3 to 5 days regularly, then as needed only - Take Tylenol 500-1000mg  per dose as needed every 8 hours or 3 times a day between for pain, fevers, or chills - Drink extra clear fluids (water, or G2 gatorade), try colder soft foods if needed otherwise regular diet - Drink warm herbal tea with honey for sore throat  There is a chance that this is more of a Viral Pharyngitis, in which case it will take time to run it's course regardless, and may have some hoarseness or throat pain lasting for up to 7-10 days then improve  REQUIRED self quarantine to [place] - advised to avoid all exposure with others while during treatment. Should continue to quarantine for up to 7-14 days, pending resolution of symptoms, if symptoms resolve by 7 days and is afebrile >3 days - may STOP self quarantine at that time.  Please schedule a Follow-up Appointment to: Return in about 1 week (around 06/16/2018), or if symptoms worsen or fail to improve, for sore throat.  If you have any other questions or concerns, please feel free to call the office or send a message through [place]. You may also schedule an earlier appointment if necessary.  Additionally, you may be receiving a survey about your experience at our office within  a few days to 1 week by e-mail or mail. We value your feedback.  Nobie Putnam, DO [place]

## 2018-06-09 NOTE — Telephone Encounter (Signed)
pt called left a message that she needs refills on her medications. zoloft and lithium

## 2018-06-09 NOTE — Progress Notes (Signed)
Virtual Visit via Telephone The purpose of this virtual visit is to provide medical care while limiting exposure to the novel coronavirus (COVID19) for both patient and office staff.  Consent was obtained for phone visit:  Yes.   Answered questions that patient had about telehealth interaction:  Yes.   I discussed the limitations, risks, security and privacy concerns of performing an evaluation and management service by telephone. I also discussed with the patient that there may be a patient responsible charge related to this service. The patient expressed understanding and agreed to proceed.  Patient Location: Home Provider Location: [facility] (Office)  PCP is Cassell Smiles, AGPCNP-BC - I am currently covering during her maternity leave.   ---------------------------------------------------------------------- Chief Complaint  Patient presents with  . Sore Throat    throat covered in white patches, tonsil swollen, swallowing tongue x 3 day. Difficulty with swallowing. The pt been gargling with salt water and taking sore throat OTC medication.  Productive cough, runny nose and SOB    S: Reviewed CMA documentation. I have called patient and gathered additional HPI as follows:  Acute Pharyngitis / Sinusitis Reports that symptoms started about 3 days ago with sore throat and cough with sinus congestion then developed some white patches on tonsils, and difficulty swallowing due to pain. She has limited diet now due to pain. Tried salt water gargling. Tried Ibuprofen '800mg'$  every 8 hours PRN. Taking OTC Delsym PRN cough.  Additionally she sent pictures via mychart today  Today symptoms somewhat improving gradully - Admits horse voice  Patient is currently staying at home, occasionally go out shopping for essential supplies Denies any high risk travel to areas of current concern for COVID19. Denies any known or suspected exposure to person with or possibly with COVID19.   Denies any fevers, chills, sweats, body ache, shortness of breath, sinus pain or pressure, headache, abdominal pain, diarrhea  Past Medical History:  Diagnosis Date  . Allergy   . Anxiety   . Asthma   . Bipolar disorder ([place])   . Bursitis   . Depression   . Diabetes mellitus ([place]) 09/2017  . Diabetes mellitus, type II ([place])   . Essential hypertension 03/28/2017  . Frequent headaches   . GERD (gastroesophageal reflux disease)   . Headache   . High cholesterol   . Hypertension   . Irritable bowel syndrome (IBS)   . Irritable colon 02/23/2010  . Migraine 04/02/2017  . Obesity (BMI 30-39.9) 11/25/2013  . Seizure ([place]) [phone]  . Seizures ([place])   . Tremor    Social History   Tobacco Use  . Smoking status: Current Every Day Smoker    Packs/day: 0.50    Years: 27.00    Pack years: 13.50    Types: Cigarettes  . Smokeless tobacco: Never Used  Substance Use Topics  . Alcohol use: Not Currently  . Drug use: No    Current Outpatient Medications:  .  ACCU-CHEK SOFTCLIX LANCETS lancets, by Other route. Use as instructed, Disp: , Rfl:  .  albuterol (PROAIR HFA) 108 (90 Base) MCG/ACT inhaler, Inhale 2 puffs into the lungs every 4 (four) hours as needed for wheezing or shortness of breath., Disp: 8 g, Rfl: 5 .  atorvastatin (LIPITOR) 40 MG tablet, Take 1 tablet (40 mg total) by mouth daily., Disp: 90 tablet, Rfl: 3 .  benztropine (COGENTIN) 1 MG tablet, Take 1 tablet (1 mg total) by mouth 3 (three) times daily., Disp: 90 tablet, Rfl: 2 .  blood  glucose meter kit and supplies, Dispense based on patient and insurance preference. Use up to four times daily as directed. (FOR ICD-10 E10.9, E11.9)., Disp: 1 each, Rfl: 0 .  budesonide-formoterol (SYMBICORT) 160-4.5 MCG/ACT inhaler, Inhale 2 puffs into the lungs 2 (two) times daily., Disp: 1 Inhaler, Rfl: 3 .  Calcium Carb-Cholecalciferol (CALCIUM 600+D3 PO), Take 1 tablet by mouth daily., Disp: , Rfl:  .  Dulaglutide (TRULICITY) 1.5 OA/4.1YS  SOPN, Inject 1.5 mg into the skin every 7 (seven) days., Disp: 4 pen, Rfl: 12 .  esomeprazole (NEXIUM) 20 MG capsule, Take 1 capsule (20 mg total) by mouth daily at 12 noon., Disp: 90 capsule, Rfl: 3 .  Fluticasone-Umeclidin-Vilant (TRELEGY ELLIPTA) 100-62.5-25 MCG/INH AEPB, Inhale 1 puff into the lungs daily., Disp: 30 each, Rfl: 5 .  gabapentin (NEURONTIN) 300 MG capsule, Take 1 capsule (300 mg total) by mouth at bedtime. For restless legs, Disp: 90 capsule, Rfl: 1 .  glucose blood (ACCU-CHEK AVIVA PLUS) test strip, USE 1 STRIP TO CHECK GLUCOSE 4 TIMES DAILY AS DIRECTED., Disp: 400 each, Rfl: 3 .  ibuprofen (ADVIL,MOTRIN) 200 MG tablet, Take 400-800 mg by mouth every 6 (six) hours as needed (for pain or headaches)., Disp: , Rfl:  .  lisinopril (PRINIVIL,ZESTRIL) 10 MG tablet, Take 1 tablet (10 mg total) by mouth daily., Disp: 90 tablet, Rfl: 1 .  lithium carbonate (LITHOBID) 300 MG CR tablet, TAKE 1 TABLET BY MOUTH TWICE DAILY WITH A MEAL, Disp: 60 tablet, Rfl: 0 .  metFORMIN (GLUCOPHAGE) 500 MG tablet, Take 1 tablet (500 mg total) by mouth 2 (two) times daily with a meal., Disp: 180 tablet, Rfl: 3 .  norethindrone-ethinyl estradiol (JUNEL FE,GILDESS FE,LOESTRIN FE) 1-20 MG-MCG tablet, Take 1 tablet by mouth daily., Disp: 1 Package, Rfl: 11 .  OLANZapine (ZYPREXA) 15 MG tablet, Take 0.5 tablets (7.5 mg total) by mouth at bedtime., Disp: 90 tablet, Rfl: 0 .  omega-3 acid ethyl esters (LOVAZA) 1 g capsule, Take 1 capsule (1 g total) by mouth 2 (two) times daily., Disp: 180 capsule, Rfl: 1 .  primidone (MYSOLINE) 50 MG tablet, Take 1 tablet (50 mg total) by mouth at bedtime., Disp: 90 tablet, Rfl: 1 .  sertraline (ZOLOFT) 100 MG tablet, Take 1 tablet (100 mg total) by mouth daily., Disp: 90 tablet, Rfl: 1 .  tiZANidine (ZANAFLEX) 4 MG tablet, Take 0.5-1 tablets (2-4 mg total) by mouth every 8 (eight) hours as needed for muscle spasms., Disp: 90 tablet, Rfl: 1 .  traZODone (DESYREL) 100 MG tablet, Take  1 tablet (100 mg total) by mouth at bedtime. For sleep, Disp: 90 tablet, Rfl: 1 .  acetaminophen (TYLENOL) 500 MG tablet, Take 500 mg by mouth every 6 (six) hours as needed., Disp: , Rfl:  .  amoxicillin (AMOXIL) 500 MG capsule, Take 1 capsule (500 mg total) by mouth 2 (two) times daily. For 10 days, Disp: 20 capsule, Rfl: 0 .  baclofen (LIORESAL) 10 MG tablet, , Disp: , Rfl:  .  benzonatate (TESSALON) 100 MG capsule, Take 1 capsule (100 mg total) by mouth 2 (two) times daily as needed for cough., Disp: 30 capsule, Rfl: 0 .  fluticasone (FLONASE) 50 MCG/ACT nasal spray, Place 2 sprays into both nostrils daily. Use for 4-6 weeks then stop and use seasonally or as needed., Disp: 16 g, Rfl: 3  Depression screen [facility] 2/9 06/09/2018 02/21/2018 11/19/2017  Decreased Interest 0 0 2  Down, Depressed, Hopeless 0 0 2  PHQ - 2 Score 0 0  4  Altered sleeping - 0 3  Tired, decreased energy - 0 2  Change in appetite - 0 1  Feeling bad or failure about yourself  - 0 2  Trouble concentrating - 0 3  Moving slowly or fidgety/restless - 0 1  Suicidal thoughts - 0 1  PHQ-9 Score - 0 17  Difficult doing work/chores - Not difficult at all Somewhat difficult    GAD 7 : Generalized Anxiety Score 09/06/2017  Nervous, Anxious, on Edge 2  Control/stop worrying 2  Worry too much - different things 1  Trouble relaxing 2  Restless 0  Easily annoyed or irritable 1  Afraid - awful might happen 0  Total GAD 7 Score 8  Anxiety Difficulty Very difficult    -------------------------------------------------------------------------- O: No physical exam performed due to remote telephone encounter.  Lab results reviewed.         Recent Results (from the past 2160 hour(s))  Basic metabolic panel     Status: Abnormal   Collection Time: 04/25/18 12:05 PM  Result Value Ref Range   Sodium 136 135 - 145 mmol/L   Potassium 3.6 3.5 - 5.1 mmol/L   Chloride 104 98 - 111 mmol/L   CO2 23 22 - 32 mmol/L   Glucose, Bld 154  (H) 70 - 99 mg/dL   BUN 10 6 - 20 mg/dL   Creatinine, Ser 0.61 0.44 - 1.00 mg/dL   Calcium 9.0 8.9 - 10.3 mg/dL   GFR calc non Af Amer >60 >60 mL/min   GFR calc Af Amer >60 >60 mL/min   Anion gap 9 5 - 15    Comment: Performed at [facility], [address] [place], [place] [address]  Lithium level     Status: Abnormal   Collection Time: 04/25/18 12:05 PM  Result Value Ref Range   Lithium Lvl 0.32 (L) 0.60 - 1.20 mmol/L    Comment: Performed at [facility], [address] [place], [place] [address]    -------------------------------------------------------------------------- A&P:  Suspected Acute Pharyngitis / Sinusitis, possible for benign viral etiology at onset - now concern with progression of symptoms, consider 2nd sickening and cannot rule out bacterial infection.  - Reassuring without high risk symptoms - Afebrile, without dyspnea - No comorbid pulmonary conditions (asthma, COPD) or immunocompromise  - Currently patient is MODERATE RISK for COVID19 based on current symptoms and no known travel/exposure - however at this time, now [place] is currently within phase of community spread, and therefore patient can still be potentially exposed. Cannot rule out case with mild symptoms. Testing is not recommended at this time due to mild symptoms.  1. Start empiric therapy for possible strep / sinusitis - with Amoxicillin '500mg'$  BID x 10 days 2. Start nasal steroid Flonase 2 sprays in each nostril daily for 4-6 weeks, may repeat course seasonally or as needed 3. Start Tessalon Perls take 1 capsule up to 3 times a day as needed for cough 4. Continue NSAID PRN OTC 5. See self quarantine recommendation below   REQUIRED self quarantine to [place] - advised to avoid all exposure with others while during treatment. Should continue to quarantine for up to 7-14 days, pending resolution of symptoms, if symptoms resolve by 7 days and is afebrile >3 days -  may STOP self quarantine at that time.   If symptoms do not resolve or significantly improve OR if WORSENING - fever / cough - or worsening shortness of breath - then should contact us  and seek advice on next steps in treatment at home vs where/when to seek care at Urgent Care or Hospital ED for further intervention and possible testing if indicated.    Meds ordered this encounter  Medications  . benzonatate (TESSALON) 100 MG capsule    Sig: Take 1 capsule (100 mg total) by mouth 2 (two) times daily as needed for cough.    Dispense:  30 capsule    Refill:  0  . amoxicillin (AMOXIL) 500 MG capsule    Sig: Take 1 capsule (500 mg total) by mouth 2 (two) times daily. For 10 days    Dispense:  20 capsule    Refill:  0  . fluticasone (FLONASE) 50 MCG/ACT nasal spray    Sig: Place 2 sprays into both nostrils daily. Use for 4-6 weeks then stop and use seasonally or as needed.    Dispense:  16 g    Refill:  3    Follow-up: - Return in 1 week as needed if unresolved  Patient verbalizes understanding with the above medical recommendations including the limitation of remote medical advice.  Specific follow-up and call-back criteria were given for patient to follow-up or seek medical care more urgently if needed.   - Time spent in direct consultation with patient on phone: 9 minutes   Nobie Putnam, [facility] 06/09/2018, 3:16 PM

## 2018-06-10 ENCOUNTER — Ambulatory Visit (INDEPENDENT_AMBULATORY_CARE_PROVIDER_SITE_OTHER): Payer: Self-pay | Admitting: Psychiatry

## 2018-06-10 ENCOUNTER — Telehealth: Payer: Self-pay

## 2018-06-10 ENCOUNTER — Encounter: Payer: Self-pay | Admitting: Psychiatry

## 2018-06-10 DIAGNOSIS — F4310 Post-traumatic stress disorder, unspecified: Secondary | ICD-10-CM

## 2018-06-10 DIAGNOSIS — F1211 Cannabis abuse, in remission: Secondary | ICD-10-CM

## 2018-06-10 DIAGNOSIS — F17200 Nicotine dependence, unspecified, uncomplicated: Secondary | ICD-10-CM

## 2018-06-10 DIAGNOSIS — F1010 Alcohol abuse, uncomplicated: Secondary | ICD-10-CM

## 2018-06-10 DIAGNOSIS — F1411 Cocaine abuse, in remission: Secondary | ICD-10-CM

## 2018-06-10 DIAGNOSIS — G2111 Neuroleptic induced parkinsonism: Secondary | ICD-10-CM

## 2018-06-10 DIAGNOSIS — F3130 Bipolar disorder, current episode depressed, mild or moderate severity, unspecified: Secondary | ICD-10-CM

## 2018-06-10 MED ORDER — LITHIUM CARBONATE ER 300 MG PO TBCR: EXTENDED_RELEASE_TABLET | ORAL | 1 refills | Status: DC

## 2018-06-10 MED ORDER — OLANZAPINE 15 MG PO TABS: 15.0000 mg | ORAL_TABLET | Freq: Every day | ORAL | 0 refills | Status: DC

## 2018-06-10 MED ORDER — SERTRALINE HCL 100 MG PO TABS: 100.0000 mg | ORAL_TABLET | Freq: Every day | ORAL | 1 refills | Status: DC

## 2018-06-10 NOTE — Telephone Encounter (Signed)
Sounds good

## 2018-06-10 NOTE — Telephone Encounter (Signed)
Sent Lithium and zoloft to pharmacy

## 2018-06-10 NOTE — Telephone Encounter (Signed)
pt called states that she is seeing people that are not there and she needs to know what to do.

## 2018-06-10 NOTE — Telephone Encounter (Signed)
Called left patient a message to call office back to set up appt

## 2018-06-10 NOTE — Progress Notes (Signed)
Virtual Visit via Video Note  I connected with Vanessa Baker on 06/10/18 at  4:15 PM EDT by a video enabled telemedicine application and verified that I am speaking with the correct person using two identifiers.   I discussed the limitations of evaluation and management by telemedicine and the availability of in person appointments. The patient expressed understanding and agreed to proceed.   I discussed the assessment and treatment plan with the patient. The patient was provided an opportunity to ask questions and all were answered. The patient agreed with the plan and demonstrated an understanding of the instructions.   The patient was advised to call back or seek an in-person evaluation if the symptoms worsen or if the condition fails to improve as anticipated.  [facility] MD OP Progress Note  06/10/2018 3:58 PM Vanessa Baker  MRN:  [id number]  Chief Complaint:  Chief Complaint    Follow-up     HPI: Vanessa Baker is a 43 year old Caucasian female, divorced, lives in [place], has a history of bipolar disorder, PTSD, drug-induced Parkinson's disease, alcohol use disorder, cocaine use disorder, cannabis use disorder, tobacco use disorder, hyperlipidemia, seizure disorder, IBS was evaluated by telemedicine today.  Patient had called the clinic reporting worsening psychosis.  Hence it was advised that patient be evaluated.  Writer was able to connect with patient through video consult.  Patient appeared to be alert, oriented to person place and situation.  She reports that she has a history of psychosis previously.  She has been hearing and seeing things since high school.  She reports that recently since the past 3 days she has been hearing voices, talking to people who were not in her room and seeing people in her room.  She reports she recently was diagnosed with strep throat infection.  She is currently on amoxicillin and Tessalon.  She reports her throat infection is improving  gradually.  She however however has not been eating much due to the throat pain.  She however reports that she has been drinking enough fluids.  Patient reports sleep as good on trazodone higher dosage.  She reports that she increased the dosage of trazodone herself to 150 mg.  Patient reports her husband is supportive.  Patient's husband Ernie Hew provided collateral information.  Per husband patient has been sick with a throat infection.  Discussed with husband to call 911 or take patient to the nearest emergency department if she is in a crisis or has worsening symptoms.  Discussed with patient as well as husband about the patient being on lithium.  She needs to stay well-hydrated so that she does not go toxic.  She is also on medications like lisinopril which can have an impact on the lithium level.  Patient agrees with plan. Visit Diagnosis:    ICD-10-CM   1. Bipolar I disorder, most recent episode depressed ([place]) F31.30 OLANZapine (ZYPREXA) 15 MG tablet  2. Neuroleptic induced Parkinsonism ([place]) G21.11   3. PTSD (post-traumatic stress disorder) F43.10 OLANZapine (ZYPREXA) 15 MG tablet  4. Cocaine use disorder, mild, in early remission ([place]) F14.11   5. Alcohol use disorder, mild, abuse F10.10   6. Cannabis use disorder, mild, in early remission F12.11   7. Tobacco use disorder F17.200     Past Psychiatric History: Reviewed past psychiatric history from my progress note on 04/02/2017.  Past trials of Seroquel, Latuda, Ambien, clonazepam, Topamax, gabapentin, Trintellix, Abilify, Rexulti, Artane  Past Medical History:  Past Medical History:  Diagnosis Date  . Allergy   .  Anxiety   . Asthma   . Bipolar disorder ([place])   . Bursitis   . Depression   . Diabetes mellitus ([place]) 09/2017  . Diabetes mellitus, type II ([place])   . Essential hypertension 03/28/2017  . Frequent headaches   . GERD (gastroesophageal reflux disease)   . Headache   . High cholesterol   . Hypertension   .  Irritable bowel syndrome (IBS)   . Irritable colon 02/23/2010  . Migraine 04/02/2017  . Obesity (BMI 30-39.9) 11/25/2013  . Seizure ([place]) [phone]  . Seizures ([place])   . Tremor     Past Surgical History:  Procedure Laterality Date  . NO PAST SURGERIES      Family Psychiatric History: Have reviewed family psychiatric history from my progress note on 04/02/2017.  Family History:  Family History  Problem Relation Age of Onset  . Bipolar disorder Mother   . Schizophrenia Mother   . Hypertension Mother   . Diabetes Mother   . Cancer Mother   . Anxiety disorder Mother   . ADD / ADHD Mother   . Alcohol abuse Mother   . Drug abuse Mother   . Bipolar disorder Sister   . Anxiety disorder Sister   . Drug abuse Sister   . Bipolar disorder Brother   . Anxiety disorder Brother   . Drug abuse Brother   . Hypertension Father   . Cancer Father   . Breast cancer Maternal Aunt   . Breast cancer Paternal Aunt   . Breast cancer Maternal Grandmother   . Alcohol abuse Maternal Grandmother   . Breast cancer Paternal Grandmother   . Parkinson's disease Maternal Grandfather   . Alcohol abuse Maternal Grandfather   . Alcohol abuse Maternal Uncle   . ADD / ADHD Son   . Tremor Neg Hx     Social History: Reviewed social history from my progress note on 04/02/2017. Social History   Socioeconomic History  . Marital status: Married    Spouse name: dewey   . Number of children: 1  . Years of education: 58  . Highest education level: Associate degree: occupational, Hotel manager, or vocational program  Occupational History  . Occupation: disability  Social Needs  . Financial resource strain: Very hard  . Food insecurity:    Worry: Often true    Inability: Often true  . Transportation needs:    Medical: No    Non-medical: No  Tobacco Use  . Smoking status: Current Every Day Smoker    Packs/day: 0.50    Years: 27.00    Pack years: 13.50    Types: Cigarettes  . Smokeless tobacco: Never Used   Substance and Sexual Activity  . Alcohol use: Not Currently  . Drug use: No  . Sexual activity: Yes    Partners: Male    Birth control/protection: None  Lifestyle  . Physical activity:    Days per week: 0 days    Minutes per session: 0 min  . Stress: Very much  Relationships  . Social connections:    Talks on phone: Never    Gets together: Never    Attends religious service: Never    Active member of club or organization: No    Attends meetings of clubs or organizations: Never    Relationship status: Married  Other Topics Concern  . Not on file  Social History Narrative   Lives with husband, [name] (on [place])   Right-handed   Caffeine use: 1 cup coffee a  day, 2 soft drinks per day    Allergies:  Allergies  Allergen Reactions  . Amitriptyline Other (See Comments)    Confusion, paralysis    Metabolic Disorder Labs: Lab Results  Component Value Date   HGBA1C 6.2 (A) 02/21/2018   MPG 128 11/19/2017   MPG 137 08/13/2017   Lab Results  Component Value Date   PROLACTIN 4.6 (L) 04/22/2017   PROLACTIN 10.2 11/10/2015   Lab Results  Component Value Date   CHOL 149 03/05/2018   TRIG 637 (H) 03/05/2018   HDL 26 (L) 03/05/2018   CHOLHDL 5.7 (H) 03/05/2018   [place]  03/05/2018     Comment:     . LDL cholesterol not calculated. Triglyceride levels greater than 400 mg/dL invalidate calculated LDL results. . Reference range: <100 . Desirable range <100 mg/dL for primary prevention;   <70 mg/dL for patients with CHD or diabetic patients  with > or = 2 CHD risk factors. Marland Kitchen LDL-C is now calculated using the Martin-Hopkins  calculation, which is a validated novel method providing  better accuracy than the Friedewald equation in the  estimation of LDL-C.  Cresenciano Genre et al. Annamaria Helling. 1027;253(66): 2061-2068  (http://education.[employer].com/faq/FAQ164)    LDLCALC 81 11/19/2017   Lab Results  Component Value Date   TSH 2.08 11/19/2017   TSH 2.130  10/25/2016    Therapeutic Level Labs: Lab Results  Component Value Date   LITHIUM 0.32 (L) 04/25/2018   Lab Results  Component Value Date   VALPROATE 61.4 08/13/2017   VALPROATE <10 (L) 04/22/2017   No components found for:  CBMZ  Current Medications: Current Outpatient Medications  Medication Sig Dispense Refill  . ACCU-CHEK SOFTCLIX LANCETS lancets by Other route. Use as instructed    . acetaminophen (TYLENOL) 500 MG tablet Take 500 mg by mouth every 6 (six) hours as needed.    Marland Kitchen albuterol (PROAIR HFA) 108 (90 Base) MCG/ACT inhaler Inhale 2 puffs into the lungs every 4 (four) hours as needed for wheezing or shortness of breath. 8 g 5  . amoxicillin (AMOXIL) 500 MG capsule Take 1 capsule (500 mg total) by mouth 2 (two) times daily. For 10 days 20 capsule 0  . atorvastatin (LIPITOR) 40 MG tablet Take 1 tablet (40 mg total) by mouth daily. 90 tablet 3  . baclofen (LIORESAL) 10 MG tablet     . benzonatate (TESSALON) 100 MG capsule Take 1 capsule (100 mg total) by mouth 2 (two) times daily as needed for cough. 30 capsule 0  . benztropine (COGENTIN) 1 MG tablet Take 1 tablet (1 mg total) by mouth 3 (three) times daily. 90 tablet 2  . blood glucose meter kit and supplies Dispense based on patient and insurance preference. Use up to four times daily as directed. (FOR ICD-10 E10.9, E11.9). 1 each 0  . budesonide-formoterol (SYMBICORT) 160-4.5 MCG/ACT inhaler Inhale 2 puffs into the lungs 2 (two) times daily. 1 Inhaler 3  . Calcium Carb-Cholecalciferol (CALCIUM 600+D3 PO) Take 1 tablet by mouth daily.    . Dulaglutide (TRULICITY) 1.5 YQ/0.3KV SOPN Inject 1.5 mg into the skin every 7 (seven) days. 4 pen 12  . esomeprazole (NEXIUM) 20 MG capsule Take 1 capsule (20 mg total) by mouth daily at 12 noon. 90 capsule 3  . fluticasone (FLONASE) 50 MCG/ACT nasal spray Place 2 sprays into both nostrils daily. Use for 4-6 weeks then stop and use seasonally or as needed. 16 g 3  .  Fluticasone-Umeclidin-Vilant (TRELEGY ELLIPTA) 100-62.5-25 MCG/INH AEPB Inhale  1 puff into the lungs daily. 30 each 5  . gabapentin (NEURONTIN) 300 MG capsule Take 1 capsule (300 mg total) by mouth at bedtime. For restless legs 90 capsule 1  . glucose blood (ACCU-CHEK AVIVA PLUS) test strip USE 1 STRIP TO CHECK GLUCOSE 4 TIMES DAILY AS DIRECTED. 400 each 3  . ibuprofen (ADVIL,MOTRIN) 200 MG tablet Take 400-800 mg by mouth every 6 (six) hours as needed (for pain or headaches).    Marland Kitchen lisinopril (PRINIVIL,ZESTRIL) 10 MG tablet Take 1 tablet (10 mg total) by mouth daily. 90 tablet 1  . lithium carbonate (LITHOBID) 300 MG CR tablet TAKE 1 TABLET BY MOUTH TWICE DAILY WITH A MEAL 60 tablet 1  . metFORMIN (GLUCOPHAGE) 500 MG tablet Take 1 tablet (500 mg total) by mouth 2 (two) times daily with a meal. 180 tablet 3  . norethindrone-ethinyl estradiol (JUNEL FE,GILDESS FE,LOESTRIN FE) 1-20 MG-MCG tablet Take 1 tablet by mouth daily. 1 Package 11  . OLANZapine (ZYPREXA) 15 MG tablet Take 1 tablet (15 mg total) by mouth at bedtime. 90 tablet 0  . omega-3 acid ethyl esters (LOVAZA) 1 g capsule Take 1 capsule (1 g total) by mouth 2 (two) times daily. 180 capsule 1  . primidone (MYSOLINE) 50 MG tablet Take 1 tablet (50 mg total) by mouth at bedtime. 90 tablet 1  . sertraline (ZOLOFT) 100 MG tablet Take 1 tablet (100 mg total) by mouth daily. 90 tablet 1  . tiZANidine (ZANAFLEX) 4 MG tablet Take 0.5-1 tablets (2-4 mg total) by mouth every 8 (eight) hours as needed for muscle spasms. 90 tablet 1  . traZODone (DESYREL) 100 MG tablet Take 1 tablet (100 mg total) by mouth at bedtime. For sleep 90 tablet 1   No current facility-administered medications for this visit.      Musculoskeletal: Strength & Muscle Tone: UTA Gait & Station: normal Patient leans: N/A  Psychiatric Specialty Exam: Review of Systems  Psychiatric/Behavioral: Positive for hallucinations. The patient is nervous/anxious and has insomnia.   All  other systems reviewed and are negative.   There were no vitals taken for this visit.There is no height or weight on file to calculate BMI.  General Appearance: Casual  Eye Contact:  Fair  Speech:  Clear and Coherent  Volume:  Normal  Mood:  Anxious  Affect:  Congruent  Thought Process:  Goal Directed and Descriptions of Associations: Intact  Orientation:  Full (Time, Place, and Person)  Thought Content: Hallucinations: Auditory Visual   Suicidal Thoughts:  No  Homicidal Thoughts:  No  Memory:  Immediate;   Fair Recent;   Fair Remote;   Fair  Judgement:  Fair  Insight:  Fair  Psychomotor Activity:  Normal  Concentration:  Concentration: Fair and Attention Span: Fair  Recall:  [employer] of Knowledge: Fair  Language: Fair  Akathisia:  No  Handed:  Right  AIMS (if indicated): denies tremors, rigidity,stiffness  Assets:  Communication Skills Desire for Improvement Social Support  ADL's:  Intact  Cognition: WNL  Sleep:  Restless   Screenings: AIMS     Office Visit from 04/07/2018 in [facility] Office Visit from 01/01/2018 in [facility] Total Score  10  1    GAD-7     Office Visit from 09/06/2017 in [facility]  Total GAD-7 Score  8    PHQ2-9     Office Visit from 06/09/2018 in [facility] Office Visit from 02/21/2018  in [place] from 11/19/2017 in [facility] Office Visit from 09/06/2017 in [facility] Office Visit from 03/28/2017 in [place]  PHQ-2 Total Score  0  0  _0 PHQ-9 Total Score  -  0  _1 Assessment and Plan: [place] is a 43 yr old Caucasian female who has a history of bipolar disorder, PTSD, panic disorder, drug-induced parkinsonism, seizure disorder, diabetes melitis, polysubstance abuse was evaluated by telemedicine today.  Patient is biologically predisposed given  her history of trauma as well as multiple medical problems, mental health problems in her family.  Patient called the clinic reporting worsening psychotic symptoms.  Patient with recent strep throat infection as well as worsening psychosis, will benefit from medication readjustment.  Plan For bipolar disorder-unstable Lithium 300 mg p.o. twice daily with meals. Lithium level on 04/25/2018-0.32-subtherapeutic.  However since she is on other medications as well as lisinopril will get another lithium level done today.  Will mail lab slip to get lithium level done to patient today.  This was discussed with patient and she agrees with plan. Zoloft 100 mg p.o. daily. Increase Zyprexa back to 15 mg p.o. nightly.  Panic attacks-improving Zoloft 100 mg p.o. daily  PTSD- stable Zoloft 100 mg p.o. daily  For insomnia-restless Patient reports she increased her trazodone to 150 mg and hence has been sleeping better. Will not make more dose changes today.  Zyprexa will also help.  For RLS-improving Gabapentin 300 mg p.o. nightly  For drug-induced Parkinson's disease-improving Cogentin 1 mg p.o. 3 times daily.  For alcohol use disorder/cocaine and cannabis use disorder-improving Patient reports she continues to stay sober.  For tobacco use disorder-unstable Not ready to quit.  Provided smoking cessation counseling.  Have obtained collateral information from her husband Ernie Hew during the session as summarized above.  Crisis plan discussed with patient as well as husband.  Will order the following labs-CMP, urine analysis, lithium level-lab slip to be mailed to patient today.  Follow-up in clinic in 1week to 2 weeks or sooner if needed.  I have spent atleast 40 minutes non face to face with patient today. More than 50 % of the time was spent for psychoeducation and supportive psychotherapy and care coordination.  This note was generated in part or whole with voice recognition software. Voice  recognition is usually quite accurate but there are transcription errors that can and very often do occur. I apologize for any typographical errors that were not detected and corrected.         Ursula Alert, MD 06/11/2018, 12:58 PM

## 2018-06-10 NOTE — Telephone Encounter (Signed)
pls schedule an appointment to be seen today (squeeze her in) , otherwise pls advise her to go to nearest ED if she is in a crisis. Thanks jess

## 2018-06-11 ENCOUNTER — Encounter: Payer: Self-pay | Admitting: Psychiatry

## 2018-06-19 ENCOUNTER — Other Ambulatory Visit: Payer: Self-pay

## 2018-06-19 ENCOUNTER — Other Ambulatory Visit
Admission: RE | Admit: 2018-06-19 | Discharge: 2018-06-19 | Disposition: A | Discharge status: Home / Self Care | Payer: Medicare HMO | Source: Ambulatory Visit | Attending: Psychiatry | Admitting: Psychiatry

## 2018-06-19 DIAGNOSIS — R69 Illness, unspecified: Secondary | ICD-10-CM | POA: Diagnosis not present

## 2018-06-19 DIAGNOSIS — F3130 Bipolar disorder, current episode depressed, mild or moderate severity, unspecified: Secondary | ICD-10-CM | POA: Diagnosis present

## 2018-06-19 LAB — COMPREHENSIVE METABOLIC PANEL
ALT: 18 U/L (ref 0–44)
AST: 17 U/L (ref 15–41)
Albumin: 4 g/dL (ref 3.5–5.0)
Alkaline Phosphatase: 74 U/L (ref 38–126)
Anion gap: 8 (ref 5–15)
BUN: 8 mg/dL (ref 6–20)
CO2: 24 mmol/L (ref 22–32)
Calcium: 9 mg/dL (ref 8.9–10.3)
Chloride: 104 mmol/L (ref 98–111)
Creatinine, Ser: 0.68 mg/dL (ref 0.44–1.00)
GFR calc Af Amer: >60 mL/min (ref >60)
GFR calc non Af Amer: >60 mL/min (ref >60)
Glucose, Bld: 85 mg/dL (ref 70–99)
Potassium: 4.2 mmol/L (ref 3.5–5.1)
Sodium: 136 mmol/L (ref 135–145)
Total Bilirubin: 0.3 mg/dL (ref 0.3–1.2)
Total Protein: 7.1 g/dL (ref 6.5–8.1)

## 2018-06-19 LAB — URINALYSIS, ROUTINE W REFLEX MICROSCOPIC
Bacteria, UA: NONE SEEN
Bilirubin Urine: NEGATIVE
Glucose, UA: NEGATIVE mg/dL
Ketones, ur: NEGATIVE mg/dL
Leukocytes,Ua: NEGATIVE
Nitrite: NEGATIVE
Protein, ur: NEGATIVE mg/dL
Specific Gravity, Urine: 1.006 (ref 1.005–1.030)
pH: 6 (ref 5.0–8.0)

## 2018-06-19 LAB — LITHIUM LEVEL: Lithium Lvl: 0.33 mmol/L — ABNORMAL LOW (ref 0.60–1.20)

## 2018-06-20 DIAGNOSIS — R69 Illness, unspecified: Secondary | ICD-10-CM | POA: Diagnosis not present

## 2018-06-20 LAB — URINE CULTURE: Culture: NO GROWTH

## 2018-06-25 ENCOUNTER — Encounter: Payer: Self-pay | Admitting: Psychiatry

## 2018-06-25 ENCOUNTER — Ambulatory Visit (INDEPENDENT_AMBULATORY_CARE_PROVIDER_SITE_OTHER): Payer: Self-pay | Admitting: Psychiatry

## 2018-06-25 ENCOUNTER — Other Ambulatory Visit: Payer: Self-pay

## 2018-06-25 DIAGNOSIS — F3130 Bipolar disorder, current episode depressed, mild or moderate severity, unspecified: Secondary | ICD-10-CM

## 2018-06-25 DIAGNOSIS — F1010 Alcohol abuse, uncomplicated: Secondary | ICD-10-CM

## 2018-06-25 DIAGNOSIS — F17200 Nicotine dependence, unspecified, uncomplicated: Secondary | ICD-10-CM

## 2018-06-25 DIAGNOSIS — F4310 Post-traumatic stress disorder, unspecified: Secondary | ICD-10-CM

## 2018-06-25 DIAGNOSIS — F1411 Cocaine abuse, in remission: Secondary | ICD-10-CM

## 2018-06-25 DIAGNOSIS — F1211 Cannabis abuse, in remission: Secondary | ICD-10-CM

## 2018-06-25 DIAGNOSIS — G2111 Neuroleptic induced parkinsonism: Secondary | ICD-10-CM

## 2018-06-25 NOTE — Progress Notes (Signed)
Virtual Visit via Video Note  I connected with Vanessa Baker on 06/25/18 at  2:45 PM EDT by a video enabled telemedicine application and verified that I am speaking with the correct person using two identifiers.   I discussed the limitations of evaluation and management by telemedicine and the availability of in person appointments. The patient expressed understanding and agreed to proceed.    I discussed the assessment and treatment plan with the patient. The patient was provided an opportunity to ask questions and all were answered. The patient agreed with the plan and demonstrated an understanding of the instructions.   The patient was advised to call back or seek an in-person evaluation if the symptoms worsen or if the condition fails to improve as anticipated.  [place] MD OP Progress Note  06/25/2018 3:58 PM Vanessa Baker  MRN:  [id number]  Chief Complaint:  Chief Complaint    Follow-up     HPI: Erva is a 43 year old Caucasian female, divorced, lives in [place], has a history of bipolar disorder, PTSD, drug-induced Parkinson's disease, alcohol use disorder, cocaine use disorder, cannabis use disorder, tobacco use disorder, hyperlipidemia, seizure disorder, IBS was evaluated by telemedicine today.   Patient today reports she and her husband continues to stay safe.  She is coping okay with the COVID-19 outbreak.  She goes out for grocery shopping otherwise has been staying home mostly.  She has been making use of fall precautions.  She reports the higher dosage of olanzapine has been helpful.  She denies any psychosis.  She denies any significant mood lability.  She reports her anxiety symptoms are more under control.  She is compliant on the lithium.  She reports sleep is improved with the trazodone.  She continues to wake up 2-3 times at night to use the bathroom.  Discussed with patient about limiting fluids towards the end of the day.  She will work on sleep  hygiene.  Discussed lithium level and other labs which were reported with patient.  Lithium level on 06/19/2018-subtherapeutic at 0.3.  This was discussed with patient.  Patient however notes that her mood symptoms have improved and hence will not make any readjustments today.  Patient denies any suicidality, homicidality or perceptual disturbances.  Visit Diagnosis:    ICD-10-CM   1. Bipolar I disorder, most recent episode depressed ([place]) F31.30   2. Neuroleptic induced Parkinsonism ([place]) G21.11   3. PTSD (post-traumatic stress disorder) F43.10   4. Cocaine use disorder, mild, in early remission ([place]) F14.11   5. Alcohol use disorder, mild, abuse F10.10   6. Cannabis use disorder, mild, in early remission F12.11   7. Tobacco use disorder F17.200     Past Psychiatric History: Reviewed past psychiatric history from my progress note on 04/02/2017.  Past trials of Seroquel, Latuda, Ambien, clonazepam, Topamax, gabapentin, Trintellix, Abilify, Rexulti, Artane  Past Medical History:  Past Medical History:  Diagnosis Date  . Allergy   . Anxiety   . Asthma   . Bipolar disorder ([place])   . Bursitis   . Depression   . Diabetes mellitus ([place]) 09/2017  . Diabetes mellitus, type II ([place])   . Essential hypertension 03/28/2017  . Frequent headaches   . GERD (gastroesophageal reflux disease)   . Headache   . High cholesterol   . Hypertension   . Irritable bowel syndrome (IBS)   . Irritable colon 02/23/2010  . Migraine 04/02/2017  . Obesity (BMI 30-39.9) 11/25/2013  . Seizure ([place]) [phone]  . Seizures (  [place])   . Tremor     Past Surgical History:  Procedure Laterality Date  . NO PAST SURGERIES      Family Psychiatric History: I have reviewed family psychiatric history from my progress note on 04/02/2017.  Family History:  Family History  Problem Relation Age of Onset  . Bipolar disorder Mother   . Schizophrenia Mother   . Hypertension Mother   . Diabetes Mother   . Cancer Mother   .  Anxiety disorder Mother   . ADD / ADHD Mother   . Alcohol abuse Mother   . Drug abuse Mother   . Bipolar disorder Sister   . Anxiety disorder Sister   . Drug abuse Sister   . Bipolar disorder Brother   . Anxiety disorder Brother   . Drug abuse Brother   . Hypertension Father   . Cancer Father   . Breast cancer Maternal Aunt   . Breast cancer Paternal Aunt   . Breast cancer Maternal Grandmother   . Alcohol abuse Maternal Grandmother   . Breast cancer Paternal Grandmother   . Parkinson's disease Maternal Grandfather   . Alcohol abuse Maternal Grandfather   . Alcohol abuse Maternal Uncle   . ADD / ADHD Son   . Tremor Neg Hx     Social History: Reviewed social history from my progress note on 04/02/2017. Social History   Socioeconomic History  . Marital status: Married    Spouse name: dewey   . Number of children: 1  . Years of education: 70  . Highest education level: Associate degree: occupational, Hotel manager, or vocational program  Occupational History  . Occupation: disability  Social Needs  . Financial resource strain: Very hard  . Food insecurity:    Worry: Often true    Inability: Often true  . Transportation needs:    Medical: No    Non-medical: No  Tobacco Use  . Smoking status: Current Every Day Smoker    Packs/day: 0.50    Years: 27.00    Pack years: 13.50    Types: Cigarettes  . Smokeless tobacco: Never Used  Substance and Sexual Activity  . Alcohol use: Not Currently  . Drug use: No  . Sexual activity: Yes    Partners: Male    Birth control/protection: None  Lifestyle  . Physical activity:    Days per week: 0 days    Minutes per session: 0 min  . Stress: Very much  Relationships  . Social connections:    Talks on phone: Never    Gets together: Never    Attends religious service: Never    Active member of club or organization: No    Attends meetings of clubs or organizations: Never    Relationship status: Married  Other Topics Concern  . Not  on file  Social History Narrative   Lives with husband, [name] (on [place])   Right-handed   Caffeine use: 1 cup coffee a day, 2 soft drinks per day    Allergies:  Allergies  Allergen Reactions  . Amitriptyline Other (See Comments)    Confusion, paralysis    Metabolic Disorder Labs: Lab Results  Component Value Date   HGBA1C 6.2 (A) 02/21/2018   MPG 128 11/19/2017   MPG 137 08/13/2017   Lab Results  Component Value Date   PROLACTIN 4.6 (L) 04/22/2017   PROLACTIN 10.2 11/10/2015   Lab Results  Component Value Date   CHOL 149 03/05/2018   TRIG 637 (H) 03/05/2018  HDL 26 (L) 03/05/2018   CHOLHDL 5.7 (H) 03/05/2018   LDLCALC  03/05/2018     Comment:     . LDL cholesterol not calculated. Triglyceride levels greater than 400 mg/dL invalidate calculated LDL results. . Reference range: <100 . Desirable range <100 mg/dL for primary prevention;   <70 mg/dL for patients with CHD or diabetic patients  with > or = 2 CHD risk factors. Marland Kitchen LDL-C is now calculated using the Martin-Hopkins  calculation, which is a validated novel method providing  better accuracy than the Friedewald equation in the  estimation of LDL-C.  Cresenciano Genre et al. Annamaria Helling. 0737;106(26): 2061-2068  (http://education.Quest[employer].com/faq/FAQ164)    LDLCALC 81 11/19/2017   Lab Results  Component Value Date   TSH 2.08 11/19/2017   TSH 2.130 10/25/2016    Therapeutic Level Labs: Lab Results  Component Value Date   LITHIUM 0.33 (L) 06/19/2018   LITHIUM 0.32 (L) 04/25/2018   Lab Results  Component Value Date   VALPROATE 61.4 08/13/2017   VALPROATE <10 (L) 04/22/2017   No components found for:  CBMZ  Current Medications: Current Outpatient Medications  Medication Sig Dispense Refill  . ACCU-CHEK SOFTCLIX LANCETS lancets by Other route. Use as instructed    . acetaminophen (TYLENOL) 500 MG tablet Take 500 mg by mouth every 6 (six) hours as needed.    Marland Kitchen albuterol (PROAIR HFA) 108 (90  Base) MCG/ACT inhaler Inhale 2 puffs into the lungs every 4 (four) hours as needed for wheezing or shortness of breath. 8 g 5  . atorvastatin (LIPITOR) 40 MG tablet Take 1 tablet (40 mg total) by mouth daily. 90 tablet 3  . baclofen (LIORESAL) 10 MG tablet     . benzonatate (TESSALON) 100 MG capsule Take 1 capsule (100 mg total) by mouth 2 (two) times daily as needed for cough. 30 capsule 0  . benztropine (COGENTIN) 1 MG tablet Take 1 tablet (1 mg total) by mouth 3 (three) times daily. 90 tablet 2  . blood glucose meter kit and supplies Dispense based on patient and insurance preference. Use up to four times daily as directed. (FOR ICD-10 E10.9, E11.9). 1 each 0  . budesonide-formoterol (SYMBICORT) 160-4.5 MCG/ACT inhaler Inhale 2 puffs into the lungs 2 (two) times daily. 1 Inhaler 3  . Calcium Carb-Cholecalciferol (CALCIUM 600+D3 PO) Take 1 tablet by mouth daily.    . Dulaglutide (TRULICITY) 1.5 RS/8.5IO SOPN Inject 1.5 mg into the skin every 7 (seven) days. 4 pen 12  . esomeprazole (NEXIUM) 20 MG capsule Take 1 capsule (20 mg total) by mouth daily at 12 noon. 90 capsule 3  . fluticasone (FLONASE) 50 MCG/ACT nasal spray Place 2 sprays into both nostrils daily. Use for 4-6 weeks then stop and use seasonally or as needed. 16 g 3  . Fluticasone-Umeclidin-Vilant (TRELEGY ELLIPTA) 100-62.5-25 MCG/INH AEPB Inhale 1 puff into the lungs daily. 30 each 5  . gabapentin (NEURONTIN) 300 MG capsule Take 1 capsule (300 mg total) by mouth at bedtime. For restless legs 90 capsule 1  . glucose blood (ACCU-CHEK AVIVA PLUS) test strip USE 1 STRIP TO CHECK GLUCOSE 4 TIMES DAILY AS DIRECTED. 400 each 3  . ibuprofen (ADVIL,MOTRIN) 200 MG tablet Take 400-800 mg by mouth every 6 (six) hours as needed (for pain or headaches).    Marland Kitchen lisinopril (PRINIVIL,ZESTRIL) 10 MG tablet Take 1 tablet (10 mg total) by mouth daily. 90 tablet 1  . lithium carbonate (LITHOBID) 300 MG CR tablet TAKE 1 TABLET BY MOUTH TWICE DAILY  WITH A MEAL  60 tablet 1  . metFORMIN (GLUCOPHAGE) 500 MG tablet Take 1 tablet (500 mg total) by mouth 2 (two) times daily with a meal. 180 tablet 3  . norethindrone-ethinyl estradiol (JUNEL FE,GILDESS FE,LOESTRIN FE) 1-20 MG-MCG tablet Take 1 tablet by mouth daily. 1 Package 11  . OLANZapine (ZYPREXA) 15 MG tablet Take 1 tablet (15 mg total) by mouth at bedtime. 90 tablet 0  . omega-3 acid ethyl esters (LOVAZA) 1 g capsule Take 1 capsule (1 g total) by mouth 2 (two) times daily. 180 capsule 1  . primidone (MYSOLINE) 50 MG tablet Take 1 tablet (50 mg total) by mouth at bedtime. 90 tablet 1  . sertraline (ZOLOFT) 100 MG tablet Take 1 tablet (100 mg total) by mouth daily. 90 tablet 1  . tiZANidine (ZANAFLEX) 4 MG tablet Take 0.5-1 tablets (2-4 mg total) by mouth every 8 (eight) hours as needed for muscle spasms. 90 tablet 1  . traZODone (DESYREL) 100 MG tablet Take 1 tablet (100 mg total) by mouth at bedtime. For sleep 90 tablet 1   No current facility-administered medications for this visit.      Musculoskeletal: Strength & Muscle Tone: within normal limits Gait & Station: normal Patient leans: N/A  Psychiatric Specialty Exam: Review of Systems  Psychiatric/Behavioral: The patient is nervous/anxious.   All other systems reviewed and are negative.   There were no vitals taken for this visit.There is no height or weight on file to calculate BMI.  General Appearance: Casual  Eye Contact:  Fair  Speech:  Normal Rate  Volume:  Normal  Mood:  Anxious  Affect:  Congruent  Thought Process:  Goal Directed and Descriptions of Associations: Intact  Orientation:  Full (Time, Place, and Person)  Thought Content: Logical   Suicidal Thoughts:  No  Homicidal Thoughts:  No  Memory:  Immediate;   Fair Recent;   Fair Remote;   Fair  Judgement:  Fair  Insight:  Fair  Psychomotor Activity:  Normal  Concentration:  Concentration: Fair and Attention Span: Fair  Recall:  [employer] of Knowledge: Fair   Language: Fair  Akathisia:  No  Handed:  Right  AIMS (if indicated): Denies tremors, rigidity, stiffness  Assets:  Communication Skills Desire for Improvement Social Support  ADL's:  Intact  Cognition: WNL  Sleep:  Improving   Screenings: AIMS     Office Visit from 04/07/2018 in [facility] Visit from 01/01/2018 in [place] Total Score  10  1    GAD-7     Office Visit from 09/06/2017 in [facility]  Total GAD-7 Score  8    PHQ2-9     Office Visit from 06/09/2018 in [facility] Office Visit from 02/21/2018 in [place] from 11/19/2017 in [facility] Office Visit from 09/06/2017 in [facility] Office Visit from 03/28/2017 in [facility]  PHQ-2 Total Score  0  0  '4  4  6  '$ PHQ-9 Total Score  -  0  '17  18  25       '$ Assessment and Plan: Camil is a 43 year old Caucasian female who has a history of bipolar disorder, PTSD, panic disorder, drug-induced parkinsonism, seizure disorder, diabetes melitis, polysubstance abuse was evaluated by telemedicine today.  Patient is biologically predisposed given her history of trauma as well as multiple medical problems, mental health problems in her  family.  Patient is currently making progress with the current medication changes.  Plan as noted below.  Plan For bipolar disorder-improving Lithium 300 mg p.o. twice daily. Lithium level on 06/19/2018-0.3-subtherapeutic.  It was discussed with patient.  She however reports mood is stable and will not make any medication changes. Zoloft 100 mg p.o. daily. Zyprexa 15 mg p.o. nightly  For panic attacks-improving Zoloft 100 mg p.o. daily.  For PTSD-stable Zoloft 100 mg p.o. daily  For insomnia-improving Trazodone 150 mg p.o. nightly She will continue to work on sleep hygiene techniques  For RLS-improving Gabapentin 300  mg p.o. nightly  For drug-induced Parkinson's disease-improving She currently denies any side effects. Cogentin 1 mg p.o. 3 times daily  For alcohol use disorder/cocaine and cannabis use disorder-improving She continues to stay sober.  Tobacco use disorder- unstable Patient is not ready to quit.  Provided smoking cessation counseling.  I have reviewed the labs dated 06/19/2018-CMP-within normal limits, urine analysis-positive for hemoglobin, urine culture-no growth Lithium level-0.3-subtherapeutic.  Labs were discussed with patient.  Follow-up in clinic in 6 weeks or sooner if needed.  Appointment scheduled for July 1 at 2 PM  I have spent atleast 25 minutes non face to face with patient today. More than 50 % of the time was spent for psychoeducation and supportive psychotherapy and care coordination.  This note was generated in part or whole with voice recognition software. Voice recognition is usually quite accurate but there are transcription errors that can and very often do occur. I apologize for any typographical errors that were not detected and corrected.          Ursula Alert, MD 06/25/2018, 3:58 PM

## 2018-06-26 ENCOUNTER — Ambulatory Visit: Payer: Medicare Other | Admitting: Psychiatry

## 2018-07-08 ENCOUNTER — Other Ambulatory Visit: Payer: Self-pay | Admitting: Nurse Practitioner

## 2018-07-08 DIAGNOSIS — N926 Irregular menstruation, unspecified: Secondary | ICD-10-CM

## 2018-07-17 ENCOUNTER — Encounter: Payer: Self-pay | Admitting: Nurse Practitioner

## 2018-07-21 ENCOUNTER — Other Ambulatory Visit: Payer: Self-pay

## 2018-07-21 DIAGNOSIS — J4541 Moderate persistent asthma with (acute) exacerbation: Secondary | ICD-10-CM

## 2018-07-25 ENCOUNTER — Ambulatory Visit (INDEPENDENT_AMBULATORY_CARE_PROVIDER_SITE_OTHER): Payer: Medicare HMO | Admitting: Family Medicine

## 2018-07-25 ENCOUNTER — Encounter: Payer: Self-pay | Admitting: Family Medicine

## 2018-07-25 ENCOUNTER — Other Ambulatory Visit: Payer: Self-pay

## 2018-07-25 DIAGNOSIS — N3001 Acute cystitis with hematuria: Secondary | ICD-10-CM

## 2018-07-25 DIAGNOSIS — N390 Urinary tract infection, site not specified: Secondary | ICD-10-CM | POA: Diagnosis not present

## 2018-07-25 DIAGNOSIS — B379 Candidiasis, unspecified: Secondary | ICD-10-CM | POA: Diagnosis not present

## 2018-07-25 LAB — POCT URINALYSIS DIPSTICK
Bilirubin, UA: NEGATIVE
Glucose, UA: NEGATIVE
Ketones, UA: NEGATIVE
Nitrite, UA: NEGATIVE
Protein, UA: NEGATIVE
Spec Grav, UA: 1.01 (ref 1.010–1.025)
Urobilinogen, UA: 0.2 E.U./dL
pH, UA: 5 (ref 5.0–8.0)

## 2018-07-25 MED ORDER — FLUCONAZOLE 150 MG PO TABS: ORAL_TABLET | ORAL | 0 refills | Status: DC

## 2018-07-25 MED ORDER — CEPHALEXIN 500 MG PO CAPS: 500.0000 mg | ORAL_CAPSULE | Freq: Three times a day (TID) | ORAL | 0 refills | Status: DC

## 2018-07-25 NOTE — Patient Instructions (Addendum)
1. You have a Urinary Tract Infection - this is very common, your symptoms are reassuring and you should get better within 1 week on the antibiotics - Start Keflex 500mg  3 times daily for next 7 days, complete entire course, even if feeling better - After finish antibiotic, can take the Diflucan anti yeast medicine as well - We sent urine for a culture, we will call you within next few days if we need to change antibiotics - Please drink plenty of fluids, improve hydration over next 1 week  If symptoms worsening, developing nausea / vomiting, worsening back pain, fevers / chills / sweats, then please return for re-evaluation sooner.  To prevent bladder and kidney infections...  Wipe front to back after using the restroom  Drink enough water to keep your pee clear to pale yellow  If you think you are getting another bladder infection, start drinking cranberry juice and come see [place] so we can check the urine.   Please schedule a Follow-up Appointment to: Return in about 1 week (around 08/01/2018), or if symptoms worsen or fail to improve, for UTI.  If you have any other questions or concerns, please feel free to call the office or send a message through [place]. You may also schedule an earlier appointment if necessary.  Additionally, you may be receiving a survey about your experience at our office within a few days to 1 week by e-mail or mail. We value your feedback.  Nobie Putnam, DO [place]

## 2018-07-25 NOTE — Progress Notes (Signed)
Virtual Visit via Telephone The purpose of this virtual visit is to provide medical care while limiting exposure to the novel coronavirus (COVID19) for both patient and office staff.  Consent was obtained for phone visit:  Yes.   Answered questions that patient had about telehealth interaction:  Yes.   I discussed the limitations, risks, security and privacy concerns of performing an evaluation and management service by telephone. I also discussed with the patient that there may be a patient responsible charge related to this service. The patient expressed understanding and agreed to proceed.  Patient Location: Home Provider Location: [facility])  ---------------------------------------------------------------------- Chief Complaint  Patient presents with  . Urinary Tract Infection    onset 2 weeks     S: Reviewed CMA documentation. I have called patient and gathered additional HPI as follows:  UTI Reports that symptoms started 2 weeks ago, feels urinary frequency, incomplete voiding and increased pelvic pressure and itching at times. Similar to prior UTI, last 12/2017 treated with keflex. She often gets vaginal yeast infection after antibiotics. - Tried OTC AZO temporary relief Denies any fevers chills back pain or flank pain nausea vomiting blood in urine   Past Medical History:  Diagnosis Date  . Allergy   . Anxiety   . Asthma   . Bipolar disorder ([place])   . Bursitis   . Depression   . Diabetes mellitus ([place]) 09/2017  . Diabetes mellitus, type II ([place])   . Essential hypertension 03/28/2017  . Frequent headaches   . GERD (gastroesophageal reflux disease)   . Headache   . High cholesterol   . Hypertension   . Irritable bowel syndrome (IBS)   . Irritable colon 02/23/2010  . Migraine 04/02/2017  . Obesity (BMI 30-39.9) 11/25/2013  . Seizure ([place]) [phone]  . Seizures ([place])   . Tremor    Social History   Tobacco Use  . Smoking status: Current Every  Day Smoker    Packs/day: 0.50    Years: 27.00    Pack years: 13.50    Types: Cigarettes  . Smokeless tobacco: Never Used  Substance Use Topics  . Alcohol use: Not Currently  . Drug use: No    Current Outpatient Medications:  .  ACCU-CHEK SOFTCLIX LANCETS lancets, by Other route. Use as instructed, Disp: , Rfl:  .  acetaminophen (TYLENOL) 500 MG tablet, Take 500 mg by mouth every 6 (six) hours as needed., Disp: , Rfl:  .  albuterol (PROAIR HFA) 108 (90 Base) MCG/ACT inhaler, Inhale 2 puffs into the lungs every 4 (four) hours as needed for wheezing or shortness of breath., Disp: 8 g, Rfl: 5 .  atorvastatin (LIPITOR) 40 MG tablet, Take 1 tablet (40 mg total) by mouth daily., Disp: 90 tablet, Rfl: 3 .  baclofen (LIORESAL) 10 MG tablet, , Disp: , Rfl:  .  benzonatate (TESSALON) 100 MG capsule, Take 1 capsule (100 mg total) by mouth 2 (two) times daily as needed for cough., Disp: 30 capsule, Rfl: 0 .  benztropine (COGENTIN) 1 MG tablet, Take 1 tablet (1 mg total) by mouth 3 (three) times daily., Disp: 90 tablet, Rfl: 2 .  blood glucose meter kit and supplies, Dispense based on patient and insurance preference. Use up to four times daily as directed. (FOR ICD-10 E10.9, E11.9)., Disp: 1 each, Rfl: 0 .  budesonide-formoterol (SYMBICORT) 160-4.5 MCG/ACT inhaler, Inhale 2 puffs into the lungs 2 (two) times daily., Disp: 1 Inhaler, Rfl: 3 .  Calcium Carb-Cholecalciferol (CALCIUM 600+D3 PO),  Take 1 tablet by mouth daily., Disp: , Rfl:  .  Dulaglutide (TRULICITY) 1.5 SV/7.7LT SOPN, Inject 1.5 mg into the skin every 7 (seven) days., Disp: 4 pen, Rfl: 12 .  esomeprazole (NEXIUM) 20 MG capsule, Take 1 capsule (20 mg total) by mouth daily at 12 noon., Disp: 90 capsule, Rfl: 3 .  fluticasone (FLONASE) 50 MCG/ACT nasal spray, Place 2 sprays into both nostrils daily. Use for 4-6 weeks then stop and use seasonally or as needed., Disp: 16 g, Rfl: 3 .  Fluticasone-Umeclidin-Vilant (TRELEGY ELLIPTA) 100-62.5-25  MCG/INH AEPB, Inhale 1 puff into the lungs daily., Disp: 30 each, Rfl: 5 .  gabapentin (NEURONTIN) 300 MG capsule, Take 1 capsule (300 mg total) by mouth at bedtime. For restless legs, Disp: 90 capsule, Rfl: 1 .  glucose blood (ACCU-CHEK AVIVA PLUS) test strip, USE 1 STRIP TO CHECK GLUCOSE 4 TIMES DAILY AS DIRECTED., Disp: 400 each, Rfl: 3 .  ibuprofen (ADVIL,MOTRIN) 200 MG tablet, Take 400-800 mg by mouth every 6 (six) hours as needed (for pain or headaches)., Disp: , Rfl:  .  JUNEL FE 1/20 1-20 MG-MCG tablet, Take 1 tablet by mouth once daily, Disp: 84 tablet, Rfl: 0 .  lisinopril (PRINIVIL,ZESTRIL) 10 MG tablet, Take 1 tablet (10 mg total) by mouth daily., Disp: 90 tablet, Rfl: 1 .  lithium carbonate (LITHOBID) 300 MG CR tablet, TAKE 1 TABLET BY MOUTH TWICE DAILY WITH A MEAL, Disp: 60 tablet, Rfl: 1 .  metFORMIN (GLUCOPHAGE) 500 MG tablet, Take 1 tablet (500 mg total) by mouth 2 (two) times daily with a meal., Disp: 180 tablet, Rfl: 3 .  OLANZapine (ZYPREXA) 15 MG tablet, Take 1 tablet (15 mg total) by mouth at bedtime., Disp: 90 tablet, Rfl: 0 .  omega-3 acid ethyl esters (LOVAZA) 1 g capsule, Take 1 capsule (1 g total) by mouth 2 (two) times daily., Disp: 180 capsule, Rfl: 1 .  primidone (MYSOLINE) 50 MG tablet, Take 1 tablet (50 mg total) by mouth at bedtime., Disp: 90 tablet, Rfl: 1 .  sertraline (ZOLOFT) 100 MG tablet, Take 1 tablet (100 mg total) by mouth daily., Disp: 90 tablet, Rfl: 1 .  tiZANidine (ZANAFLEX) 4 MG tablet, Take 0.5-1 tablets (2-4 mg total) by mouth every 8 (eight) hours as needed for muscle spasms., Disp: 90 tablet, Rfl: 1 .  traZODone (DESYREL) 100 MG tablet, Take 1 tablet (100 mg total) by mouth at bedtime. For sleep, Disp: 90 tablet, Rfl: 1 .  cephALEXin (KEFLEX) 500 MG capsule, Take 1 capsule (500 mg total) by mouth 3 (three) times daily. For 7 days, Disp: 21 capsule, Rfl: 0 .  fluconazole (DIFLUCAN) 150 MG tablet, Take one tablet by mouth on Day 1. Repeat dose 2nd  tablet on Day 3., Disp: 2 tablet, Rfl: 0  Depression screen [facility] 2/9 07/25/2018 06/09/2018 02/21/2018  Decreased Interest 1 0 0  Down, Depressed, Hopeless 1 0 0  PHQ - 2 Score 2 0 0  Altered sleeping 2 - 0  Tired, decreased energy 2 - 0  Change in appetite 0 - 0  Feeling bad or failure about yourself  1 - 0  Trouble concentrating 1 - 0  Moving slowly or fidgety/restless 0 - 0  Suicidal thoughts 0 - 0  PHQ-9 Score 8 - 0  Difficult doing work/chores Very difficult - Not difficult at all    GAD 7 : Generalized Anxiety Score 09/06/2017  Nervous, Anxious, on Edge 2  Control/stop worrying 2  Worry too much - different  things 1  Trouble relaxing 2  Restless 0  Easily annoyed or irritable 1  Afraid - awful might happen 0  Total GAD 7 Score 8  Anxiety Difficulty Very difficult    -------------------------------------------------------------------------- O: No physical exam performed due to remote telephone encounter.  Lab results reviewed.  Recent Results (from the past 2160 hour(s))  Lithium level     Status: Abnormal   Collection Time: 06/19/18 10:59 AM  Result Value Ref Range   Lithium Lvl 0.33 (L) 0.60 - 1.20 mmol/L    Comment: Performed at [facility], [address] [place], [place] [address]  Comprehensive metabolic panel     Status: None   Collection Time: 06/19/18 10:59 AM  Result Value Ref Range   Sodium 136 135 - 145 mmol/L   Potassium 4.2 3.5 - 5.1 mmol/L   Chloride 104 98 - 111 mmol/L   CO2 24 22 - 32 mmol/L   Glucose, Bld 85 70 - 99 mg/dL   BUN 8 6 - 20 mg/dL   Creatinine, Ser 0.68 0.44 - 1.00 mg/dL   Calcium 9.0 8.9 - 10.3 mg/dL   Total Protein 7.1 6.5 - 8.1 g/dL   Albumin 4.0 3.5 - 5.0 g/dL   AST 17 15 - 41 U/L   ALT 18 0 - 44 U/L   Alkaline Phosphatase 74 38 - 126 U/L   Total Bilirubin 0.3 0.3 - 1.2 mg/dL   GFR calc non Af Amer >60 >60 mL/min   GFR calc Af Amer >60 >60 mL/min   Anion gap 8 5 - 15    Comment: Performed at [facility],  [address] [place], [place] [address]  Urinalysis, Routine w reflex microscopic     Status: Abnormal   Collection Time: 06/19/18 10:59 AM  Result Value Ref Range   Color, Urine STRAW (A) YELLOW   APPearance CLEAR (A) CLEAR   Specific Gravity, Urine 1.006 1.005 - 1.030   pH 6.0 5.0 - 8.0   Glucose, UA NEGATIVE NEGATIVE mg/dL   Hgb urine dipstick MODERATE (A) NEGATIVE   Bilirubin Urine NEGATIVE NEGATIVE   Ketones, ur NEGATIVE NEGATIVE mg/dL   Protein, ur NEGATIVE NEGATIVE mg/dL   Nitrite NEGATIVE NEGATIVE   Leukocytes,Ua NEGATIVE NEGATIVE   RBC / HPF 0-5 0 - 5 RBC/hpf   WBC, UA 0-5 0 - 5 WBC/hpf   Bacteria, UA NONE SEEN NONE SEEN   Squamous Epithelial / LPF 0-5 0 - 5   Mucus PRESENT    Sperm, UA PRESENT     Comment: Performed at [facility], [address], [place], [place] [address]  Urine Culture     Status: None   Collection Time: 06/19/18 10:59 AM   Specimen: Urine, Clean Catch  Result Value Ref Range   Specimen Description      URINE, CLEAN CATCH Performed at [facility], [address] [place], [place] [address]    Special Requests      NONE Performed at [facility], [address] [place], [place] [address]    Culture      NO GROWTH Performed at [facility], [place] [address] [place], [place] [address]    Report Status 06/20/2018 FINAL   POCT Urinalysis Dipstick     Status: Abnormal   Collection Time: 07/25/18 10:01 AM  Result Value Ref Range   Color, UA amber    Clarity, UA clear    Glucose, UA Negative Negative   Bilirubin,  UA neg    Ketones, UA neg    Spec Grav, UA 1.010 1.010 - 1.025   Blood, UA trace    pH, UA 5.0 5.0 - 8.0   Protein, UA Negative Negative   Urobilinogen, UA 0.2 0.2 or 1.0 E.U./dL   Nitrite, UA neg    Leukocytes, UA Trace (A) Negative   Appearance     Odor      -------------------------------------------------------------------------- A&P:  Problem List Items Addressed This Visit     None    Visit Diagnoses    Acute cystitis with hematuria    -  Primary   Relevant Medications   cephALEXin (KEFLEX) 500 MG capsule   Other Relevant Orders   POCT Urinalysis Dipstick (Completed)   CULTURE, URINE COMPREHENSIVE   Antibiotic-induced yeast infection       Relevant Medications   cephALEXin (KEFLEX) 500 MG capsule   fluconazole (DIFLUCAN) 150 MG tablet     Clinically consistent with UTI and confirmed on UA. No recent UTIs or abx courses.  No concern for pyelo today (no systemic symptoms, neg fever, back pain, n/v).  Plan: 1. UA Trace Hgb and Leuks 2. Ordered Urine culture 3. Keflex '500mg'$  TID x 7 days - Add Diflucan after antibiotic for yeast 4. Improve PO hydration 5. RTC if no improvement 1-2 weeks, red flags given to return sooner   Meds ordered this encounter  Medications  . cephALEXin (KEFLEX) 500 MG capsule    Sig: Take 1 capsule (500 mg total) by mouth 3 (three) times daily. For 7 days    Dispense:  21 capsule    Refill:  0  . fluconazole (DIFLUCAN) 150 MG tablet    Sig: Take one tablet by mouth on Day 1. Repeat dose 2nd tablet on Day 3.    Dispense:  2 tablet    Refill:  0    Follow-up: - Return in 1 week as needed UTI  Patient verbalizes understanding with the above medical recommendations including the limitation of remote medical advice.  Specific follow-up and call-back criteria were given for patient to follow-up or seek medical care more urgently if needed.   - Time spent in direct consultation with patient on phone: 11 minutes  Nobie Putnam, [facility] 07/25/2018, 9:52 AM

## 2018-07-27 LAB — CULTURE, URINE COMPREHENSIVE
MICRO NUMBER:: 590673
SPECIMEN QUALITY:: ADEQUATE

## 2018-08-04 ENCOUNTER — Ambulatory Visit: Payer: Self-pay | Admitting: Nurse Practitioner

## 2018-08-06 ENCOUNTER — Ambulatory Visit (INDEPENDENT_AMBULATORY_CARE_PROVIDER_SITE_OTHER): Payer: Medicare HMO | Admitting: Psychiatry

## 2018-08-06 ENCOUNTER — Other Ambulatory Visit: Payer: Self-pay

## 2018-08-06 ENCOUNTER — Encounter: Payer: Self-pay | Admitting: Psychiatry

## 2018-08-06 DIAGNOSIS — G2111 Neuroleptic induced parkinsonism: Secondary | ICD-10-CM

## 2018-08-06 DIAGNOSIS — F1411 Cocaine abuse, in remission: Secondary | ICD-10-CM | POA: Insufficient documentation

## 2018-08-06 DIAGNOSIS — F4310 Post-traumatic stress disorder, unspecified: Secondary | ICD-10-CM | POA: Diagnosis not present

## 2018-08-06 DIAGNOSIS — R69 Illness, unspecified: Secondary | ICD-10-CM | POA: Diagnosis not present

## 2018-08-06 DIAGNOSIS — F1010 Alcohol abuse, uncomplicated: Secondary | ICD-10-CM

## 2018-08-06 DIAGNOSIS — F1211 Cannabis abuse, in remission: Secondary | ICD-10-CM

## 2018-08-06 DIAGNOSIS — F3130 Bipolar disorder, current episode depressed, mild or moderate severity, unspecified: Secondary | ICD-10-CM

## 2018-08-06 DIAGNOSIS — F17200 Nicotine dependence, unspecified, uncomplicated: Secondary | ICD-10-CM

## 2018-08-06 DIAGNOSIS — F1011 Alcohol abuse, in remission: Secondary | ICD-10-CM | POA: Insufficient documentation

## 2018-08-06 MED ORDER — SERTRALINE HCL 100 MG PO TABS: 100.0000 mg | ORAL_TABLET | Freq: Every day | ORAL | 1 refills | Status: DC

## 2018-08-06 MED ORDER — TRAZODONE HCL 100 MG PO TABS: 150.0000 mg | ORAL_TABLET | Freq: Every day | ORAL | 1 refills | Status: DC

## 2018-08-06 MED ORDER — OLANZAPINE 15 MG PO TABS: 15.0000 mg | ORAL_TABLET | Freq: Every day | ORAL | 0 refills | Status: DC

## 2018-08-06 MED ORDER — LITHIUM CARBONATE ER 300 MG PO TBCR: EXTENDED_RELEASE_TABLET | ORAL | 0 refills | Status: DC

## 2018-08-06 MED ORDER — BENZTROPINE MESYLATE 1 MG PO TABS: 1.0000 mg | ORAL_TABLET | Freq: Three times a day (TID) | ORAL | 1 refills | Status: DC

## 2018-08-06 NOTE — Progress Notes (Signed)
Virtual Visit via Video Note  I connected with Vanessa Baker on 08/06/18 at  2:00 PM EDT by a video enabled telemedicine application and verified that I am speaking with the correct person using two identifiers.   I discussed the limitations of evaluation and management by telemedicine and the availability of in person appointments. The patient expressed understanding and agreed to proceed. I discussed the assessment and treatment plan with the patient. The patient was provided an opportunity to ask questions and all were answered. The patient agreed with the plan and demonstrated an understanding of the instructions.   The patient was advised to call back or seek an in-person evaluation if the symptoms worsen or if the condition fails to improve as anticipated.   [place] MD OP Progress Note  08/06/2018 5:06 PM Vanessa Baker  MRN:  [id number]  Chief Complaint:  Chief Complaint    Follow-up     HPI: Thana is a 43 year old Caucasian female, divorced, lives in [place], has a history of bipolar disorder, PTSD, neuroleptic induced parkinsonism, cocaine use disorder in early remission,tobacco use disorder, cannabis use disorder , HLD , seizure disorder ,IBS, was evaluated by telemedicine today.  Patient today reports she is worried about her husband who recently is having some health problems.  Her husband likely has carpal tunnel syndrome and had a procedure done yesterday.  Patient reports she however is coping with it okay and is hopeful that he will get better.  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  She gets paid for that and that helps a lot.  She reports her mood symptoms are currently stable on the current medication regimen.  She is compliant on her medications as prescribed.  Patient denies any suicidality, homicidality or perceptual disturbances.  Discussed getting lithium labs done again prior to her  next appointment.  Discussed with her that her lithium lab slip can be mailed to her.  She agrees with plan.  Patient denies any other concerns today.   Visit Diagnosis:    ICD-10-CM   1. Bipolar I disorder, most recent episode depressed (HCC)  F31.30 traZODone (DESYREL) 100 MG tablet    OLANZapine (ZYPREXA) 15 MG tablet    lithium carbonate (LITHOBID) 300 MG CR tablet   improving  2. PTSD (post-traumatic stress disorder)  F43.10 sertraline (ZOLOFT) 100 MG tablet   improving  3. Neuroleptic induced Parkinsonism (HCC)  G21.11 benztropine (COGENTIN) 1 MG tablet  4. Cocaine use disorder, mild, in early remission ([place])  F14.11   5. Alcohol use disorder, mild, abuse  F10.10   6. Cannabis use disorder, mild, in early remission  F12.11   7. Tobacco use disorder  F17.200     Past Psychiatric History: I have reviewed past psychiatric history from my progress note on 04/02/2017.  Past trials of Seroquel, Latuda, Ambien, clonazepam, Topamax, gabapentin, Trintellix, Abilify, Rexulti, Artane  Past Medical History:  Past Medical History:  Diagnosis Date  . Allergy   . Anxiety   . Asthma   . Bipolar disorder ([place])   . Bursitis   . Depression   . Diabetes mellitus ([place]) 09/2017  . Diabetes mellitus, type II ([place])   . Essential hypertension 03/28/2017  . Frequent headaches   . GERD (gastroesophageal reflux disease)   . Headache   . High cholesterol   . Hypertension   . Irritable bowel syndrome (IBS)   . Irritable colon 02/23/2010  .  Migraine 04/02/2017  . Obesity (BMI 30-39.9) 11/25/2013  . Seizure ([place]) [phone]  . Seizures ([place])   . Tremor     Past Surgical History:  Procedure Laterality Date  . NO PAST SURGERIES      Family Psychiatric History: I have reviewed family psychiatric history from my progress note on 04/02/2017.  Family History:  Family History  Problem Relation Age of Onset  . Bipolar disorder Mother   . Schizophrenia Mother   . Hypertension Mother   . Diabetes  Mother   . Cancer Mother   . Anxiety disorder Mother   . ADD / ADHD Mother   . Alcohol abuse Mother   . Drug abuse Mother   . Bipolar disorder Sister   . Anxiety disorder Sister   . Drug abuse Sister   . Bipolar disorder Brother   . Anxiety disorder Brother   . Drug abuse Brother   . Hypertension Father   . Cancer Father   . Breast cancer Maternal Aunt   . Breast cancer Paternal Aunt   . Breast cancer Maternal Grandmother   . Alcohol abuse Maternal Grandmother   . Breast cancer Paternal Grandmother   . Parkinson's disease Maternal Grandfather   . Alcohol abuse Maternal Grandfather   . Alcohol abuse Maternal Uncle   . ADD / ADHD Son   . Tremor Neg Hx     Social History: I have reviewed social history from my progress note on 04/02/2017. Social History   Socioeconomic History  . Marital status: Married    Spouse name: dewey   . Number of children: 1  . Years of education: 65  . Highest education level: Associate degree: occupational, Hotel manager, or vocational program  Occupational History  . Occupation: disability  Social Needs  . Financial resource strain: Very hard  . Food insecurity    Worry: Often true    Inability: Often true  . Transportation needs    Medical: No    Non-medical: No  Tobacco Use  . Smoking status: Current Every Day Smoker    Packs/day: 0.50    Years: 27.00    Pack years: 13.50    Types: Cigarettes  . Smokeless tobacco: Never Used  Substance and Sexual Activity  . Alcohol use: Not Currently  . Drug use: No  . Sexual activity: Yes    Partners: Male    Birth control/protection: None  Lifestyle  . Physical activity    Days per week: 0 days    Minutes per session: 0 min  . Stress: Very much  Relationships  . Social Herbalist on phone: Never    Gets together: Never    Attends religious service: Never    Active member of club or organization: No    Attends meetings of clubs or organizations: Never    Relationship status:  Married  Other Topics Concern  . Not on file  Social History Narrative   Lives with husband, [name] (on [place])   Right-handed   Caffeine use: 1 cup coffee a day, 2 soft drinks per day    Allergies:  Allergies  Allergen Reactions  . Amitriptyline Other (See Comments)    Confusion, paralysis    Metabolic Disorder Labs: Lab Results  Component Value Date   HGBA1C 6.2 (A) 02/21/2018   MPG 128 11/19/2017   MPG 137 08/13/2017   Lab Results  Component Value Date   PROLACTIN 4.6 (L) 04/22/2017   PROLACTIN 10.2 11/10/2015  Lab Results  Component Value Date   CHOL 149 03/05/2018   TRIG 637 (H) 03/05/2018   HDL 26 (L) 03/05/2018   CHOLHDL 5.7 (H) 03/05/2018   LDLCALC  03/05/2018     Comment:     . LDL cholesterol not calculated. Triglyceride levels greater than 400 mg/dL invalidate calculated LDL results. . Reference range: <100 . Desirable range <100 mg/dL for primary prevention;   <70 mg/dL for patients with CHD or diabetic patients  with > or = 2 CHD risk factors. Marland Kitchen LDL-C is now calculated using the Martin-Hopkins  calculation, which is a validated novel method providing  better accuracy than the Friedewald equation in the  estimation of LDL-C.  Cresenciano Genre et al. Annamaria Helling. 9977;414(23): 2061-2068  (http://education.QuestDiagnostics.com/faq/FAQ164)    LDLCALC 81 11/19/2017   Lab Results  Component Value Date   TSH 2.08 11/19/2017   TSH 2.130 10/25/2016    Therapeutic Level Labs: Lab Results  Component Value Date   LITHIUM 0.33 (L) 06/19/2018   LITHIUM 0.32 (L) 04/25/2018   Lab Results  Component Value Date   VALPROATE 61.4 08/13/2017   VALPROATE <10 (L) 04/22/2017   No components found for:  CBMZ  Current Medications: Current Outpatient Medications  Medication Sig Dispense Refill  . ACCU-CHEK SOFTCLIX LANCETS lancets by Other route. Use as instructed    . acetaminophen (TYLENOL) 500 MG tablet Take 500 mg by mouth every 6 (six) hours as needed.     Marland Kitchen albuterol (PROAIR HFA) 108 (90 Base) MCG/ACT inhaler Inhale 2 puffs into the lungs every 4 (four) hours as needed for wheezing or shortness of breath. 8 g 5  . atorvastatin (LIPITOR) 40 MG tablet Take 1 tablet (40 mg total) by mouth daily. 90 tablet 3  . baclofen (LIORESAL) 10 MG tablet     . benzonatate (TESSALON) 100 MG capsule Take 1 capsule (100 mg total) by mouth 2 (two) times daily as needed for cough. 30 capsule 0  . benztropine (COGENTIN) 1 MG tablet Take 1 tablet (1 mg total) by mouth 3 (three) times daily. 270 tablet 1  . blood glucose meter kit and supplies Dispense based on patient and insurance preference. Use up to four times daily as directed. (FOR ICD-10 E10.9, E11.9). 1 each 0  . budesonide-formoterol (SYMBICORT) 160-4.5 MCG/ACT inhaler Inhale 2 puffs into the lungs 2 (two) times daily. 1 Inhaler 3  . Calcium Carb-Cholecalciferol (CALCIUM 600+D3 PO) Take 1 tablet by mouth daily.    . cephALEXin (KEFLEX) 500 MG capsule Take 1 capsule (500 mg total) by mouth 3 (three) times daily. For 7 days 21 capsule 0  . Dulaglutide (TRULICITY) 1.5 TR/3.2YE SOPN Inject 1.5 mg into the skin every 7 (seven) days. 4 pen 12  . esomeprazole (NEXIUM) 20 MG capsule Take 1 capsule (20 mg total) by mouth daily at 12 noon. 90 capsule 3  . fluconazole (DIFLUCAN) 150 MG tablet Take one tablet by mouth on Day 1. Repeat dose 2nd tablet on Day 3. 2 tablet 0  . fluticasone (FLONASE) 50 MCG/ACT nasal spray Place 2 sprays into both nostrils daily. Use for 4-6 weeks then stop and use seasonally or as needed. 16 g 3  . Fluticasone-Umeclidin-Vilant (TRELEGY ELLIPTA) 100-62.5-25 MCG/INH AEPB Inhale 1 puff into the lungs daily. 30 each 5  . gabapentin (NEURONTIN) 300 MG capsule Take 1 capsule (300 mg total) by mouth at bedtime. For restless legs 90 capsule 1  . glucose blood (ACCU-CHEK AVIVA PLUS) test strip USE 1 STRIP TO  CHECK GLUCOSE 4 TIMES DAILY AS DIRECTED. 400 each 3  . ibuprofen (ADVIL,MOTRIN) 200 MG tablet  Take 400-800 mg by mouth every 6 (six) hours as needed (for pain or headaches).    Lenda Kelp FE 1/20 1-20 MG-MCG tablet Take 1 tablet by mouth once daily 84 tablet 0  . lisinopril (PRINIVIL,ZESTRIL) 10 MG tablet Take 1 tablet (10 mg total) by mouth daily. 90 tablet 1  . lithium carbonate (LITHOBID) 300 MG CR tablet TAKE 1 TABLET BY MOUTH TWICE DAILY WITH A MEAL 180 tablet 0  . metFORMIN (GLUCOPHAGE) 500 MG tablet Take 1 tablet (500 mg total) by mouth 2 (two) times daily with a meal. 180 tablet 3  . OLANZapine (ZYPREXA) 15 MG tablet Take 1 tablet (15 mg total) by mouth at bedtime. 90 tablet 0  . omega-3 acid ethyl esters (LOVAZA) 1 g capsule Take 1 capsule (1 g total) by mouth 2 (two) times daily. 180 capsule 1  . primidone (MYSOLINE) 50 MG tablet Take 1 tablet (50 mg total) by mouth at bedtime. 90 tablet 1  . sertraline (ZOLOFT) 100 MG tablet Take 1 tablet (100 mg total) by mouth daily. 90 tablet 1  . tiZANidine (ZANAFLEX) 4 MG tablet Take 0.5-1 tablets (2-4 mg total) by mouth every 8 (eight) hours as needed for muscle spasms. 90 tablet 1  . traZODone (DESYREL) 100 MG tablet Take 1.5 tablets (150 mg total) by mouth at bedtime. For sleep 135 tablet 1   No current facility-administered medications for this visit.      Musculoskeletal: Strength & Muscle Tone: within normal limits Gait & Station: normal Patient leans: N/A  Psychiatric Specialty Exam: Review of Systems  Psychiatric/Behavioral: The patient is nervous/anxious.   All other systems reviewed and are negative.   There were no vitals taken for this visit.There is no height or weight on file to calculate BMI.  General Appearance: Casual  Eye Contact:  Fair  Speech:  Normal Rate  Volume:  Normal  Mood:  Anxious  Affect:  Congruent  Thought Process:  Goal Directed and Descriptions of Associations: Intact  Orientation:  Full (Time, Place, and Person)  Thought Content: Logical   Suicidal Thoughts:  No  Homicidal Thoughts:  No   Memory:  Immediate;   Fair Recent;   Fair Remote;   Fair  Judgement:  Fair  Insight:  Fair  Psychomotor Activity:  Normal  Concentration:  Concentration: Fair and Attention Span: Fair  Recall:  [employer] of Knowledge: Fair  Language: Fair  Akathisia:  No  Handed:  Right  AIMS (if indicated): denies tremors, rigidity  Assets:  Communication Skills Desire for Improvement Housing Intimacy Social Support  ADL's:  Intact  Cognition: WNL  Sleep:  Fair   Screenings: AIMS     Office Visit from 04/07/2018 in [facility] Office Visit from 01/01/2018 in [facility] Total Score  10  1    GAD-7     Office Visit from 09/06/2017 in [facility]  Total GAD-7 Score  8    PHQ2-9     Office Visit from 07/25/2018 in [facility] Office Visit from 06/09/2018 in [facility] Office Visit from 02/21/2018 in [facility] from 11/19/2017 in [facility] Office Visit from 09/06/2017 in [facility]  PHQ-2 Total Score  2  0  0  4  4  PHQ-9 Total Score  8  -  0  17  18       Assessment and Plan: Ravina is a 43 year old Caucasian female who has a history of bipolar disorder, PTSD, panic disorder, drug-induced parkinsonism, seizure disorder, diabetes melitis, polysubstance abuse was evaluated by telemedicine today.  Patient is biologically predisposed given her history of trauma as well as multiple medical problems, mental health problems in her family.  Patient currently has psychosocial stressors of her husband's health issues.  She however is compliant on medications and reports mood symptoms are stable.  Plan For bipolar disorder-improving Lithium 300 mg p.o. twice daily Lithium level on 06/19/2018-0.3-subtherapeutic. We will order lithium levels again-will mail lab slip to her today. Zoloft 100 mg p.o. daily Zyprexa 15 mg p.o.  nightly  For panic attacks-improving Zoloft 100 mg p.o. daily  For PTSD-stable Zoloft 100 mg p.o. daily  For insomnia-improving Trazodone 150 mg p.o. nightly She will continue to work on sleep hygiene techniques.  For RLS-improving Gabapentin 300 mg p.o. nightly  For drug-induced Parkinson's disease-improving She currently denies any side effects. Cogentin 1 mg p.o. 3 times daily.  For  alcohol use disorder/cocaine and cannabis use disorder-improving She continues to stay sober.  For tobacco use disorder-unstable Patient is not ready to quit.  Will mail lithium level lab slip to her today.  Follow-up in clinic in 2 months or sooner if needed.  September 28 at 2:30 PM  I have spent atleast 25 minutes non face to face with patient today. More than 50 % of the time was spent for psychoeducation and supportive psychotherapy and care coordination.  This note was generated in part or whole with voice recognition software. Voice recognition is usually quite accurate but there are transcription errors that can and very often do occur. I apologize for any typographical errors that were not detected and corrected.          Ursula Alert, MD 08/06/2018, 5:06 PM

## 2018-08-11 ENCOUNTER — Encounter: Payer: Self-pay | Admitting: Nurse Practitioner

## 2018-08-11 ENCOUNTER — Other Ambulatory Visit: Payer: Self-pay

## 2018-08-11 DIAGNOSIS — G2581 Restless legs syndrome: Secondary | ICD-10-CM

## 2018-08-11 MED ORDER — GABAPENTIN 300 MG PO CAPS: 300.0000 mg | ORAL_CAPSULE | Freq: Every day | ORAL | 1 refills | Status: DC

## 2018-08-12 ENCOUNTER — Encounter: Payer: Self-pay | Admitting: Nurse Practitioner

## 2018-08-23 ENCOUNTER — Encounter: Payer: Self-pay | Admitting: Nurse Practitioner

## 2018-08-23 DIAGNOSIS — R21 Rash and other nonspecific skin eruption: Secondary | ICD-10-CM

## 2018-08-25 MED ORDER — CLOTRIMAZOLE 1 % EX CREA: 1.0000 "application " | TOPICAL_CREAM | Freq: Two times a day (BID) | CUTANEOUS | 0 refills | Status: DC

## 2018-08-29 ENCOUNTER — Other Ambulatory Visit: Payer: Self-pay

## 2018-08-29 ENCOUNTER — Encounter: Payer: Self-pay | Admitting: Nurse Practitioner

## 2018-08-29 DIAGNOSIS — I10 Essential (primary) hypertension: Secondary | ICD-10-CM

## 2018-08-29 MED ORDER — LISINOPRIL 10 MG PO TABS: 10.0000 mg | ORAL_TABLET | Freq: Every day | ORAL | 0 refills | Status: DC

## 2018-08-29 NOTE — Telephone Encounter (Signed)
Refill request for Lisinopril 10mg  # 90 1 daily [place]

## 2018-09-05 ENCOUNTER — Encounter: Payer: Self-pay | Admitting: Nurse Practitioner

## 2018-09-12 ENCOUNTER — Other Ambulatory Visit: Payer: Self-pay

## 2018-09-12 ENCOUNTER — Ambulatory Visit (INDEPENDENT_AMBULATORY_CARE_PROVIDER_SITE_OTHER): Payer: Medicare HMO | Admitting: Nurse Practitioner

## 2018-09-12 ENCOUNTER — Encounter: Payer: Self-pay | Admitting: Nurse Practitioner

## 2018-09-12 DIAGNOSIS — R6889 Other general symptoms and signs: Secondary | ICD-10-CM

## 2018-09-12 DIAGNOSIS — Z20822 Contact with and (suspected) exposure to covid-19: Secondary | ICD-10-CM

## 2018-09-12 DIAGNOSIS — Z20828 Contact with and (suspected) exposure to other viral communicable diseases: Secondary | ICD-10-CM | POA: Diagnosis not present

## 2018-09-12 NOTE — Patient Instructions (Signed)
[place],   Thank you for coming in to clinic today.  1. [facility] Testing Information All you need to do is arrive at a testing site. No appointment needed. Hours (Open 8 a.m. - 3:45 p.m.) LAST TEST completed at 3:30pm  [place]: [facility], [place], [place]  Test result may take 2-7 days to result. You will be notified by MyChart or by Phone. Phone: [phone] [facility] contact, can inquire about status of test result)   Please schedule a follow-up appointment with Cassell Smiles, AGNP. As needed for any worsening symptoms.  Also, call to reschedule your pap smear and physical after you feel well.  If you have any other questions or concerns, please feel free to call the clinic or send a message through [place]. You may also schedule an earlier appointment if necessary.  You will receive a survey after today's visit either digitally by e-mail or paper by [employer]. Your experiences and feedback matter to [place].  Please respond so we know how we are doing as we provide care for you.   Cassell Smiles, DNP, AGNP-BC Adult Gerontology Nurse Practitioner [place]

## 2018-09-12 NOTE — Progress Notes (Signed)
Telemedicine Encounter: Disclosed to patient at start of encounter that we will provide appropriate telemedicine services.  Patient consents to be treated via phone prior to discussion. - Patient is at her home and is accessed via telephone. - Services are provided by Cassell Smiles from [facility].  Subjective:    Patient ID: Vanessa Baker, female    DOB: 1975/12/30, 43 y.o.   MRN: [id number]  Vanessa Baker is a 42 y.o. female presenting on 09/12/2018 for Fever (sore throat, chills, sore throats and coughing  x 1 week)  HPI Suspected Covid-19 due to positive screening questionnaire Patient presented to clinic for her physical well visit with PAP and answered yes to many of our Covid-19 screening questions.  Subsequently, this visit was converted to virtual visit and patient was asked to return to her car for evaluation over the phone.  Patient started having symptoms of acute illness about 1 week ago.  Her symptoms include sore throat, fever, chills, cough, loss of sense of taste/smell.  She is also having some difficulty breathing and attributed this mostly to her asthma.  She has not been around any close contacts of known infected persons.  She is going to stores and usually wears her own mask.    Social History   Tobacco Use   Smoking status: Current Every Day Smoker    Packs/day: 0.50    Years: 27.00    Pack years: 13.50    Types: Cigarettes   Smokeless tobacco: Never Used  Substance Use Topics   Alcohol use: Not Currently   Drug use: No    Review of Systems Per HPI unless specifically indicated above     Objective:    There were no vitals taken for this visit.  Wt Readings from Last 3 Encounters:  04/01/18 186 lb 6.4 oz (84.6 kg)  03/31/18 185 lb 6.4 oz (84.1 kg)  02/21/18 184 lb (83.5 kg)    Physical Exam Patient remotely monitored.  Verbal communication appropriate.  Cognition normal.   Results for orders placed or  performed in visit on 07/25/18  CULTURE, URINE COMPREHENSIVE   Specimen: Urine  Result Value Ref Range   MICRO NUMBER: [id number]    SPECIMEN QUALITY: Adequate    Source OTHER (SPECIFY)    STATUS: FINAL    ISOLATE 1: Escherichia coli (A)       Susceptibility   Escherichia coli - CULT, URN, SPECIAL NEGATIVE 1    AMOX/CLAVULANIC <=2 Sensitive     AMPICILLIN <=2 Sensitive     AMPICILLIN/SULBACTAM <=2 Sensitive     CEFAZOLIN* <=4 Not Reportable      * For infections other than uncomplicated UTIcaused by E. coli, K. pneumoniae or P. mirabilis:Cefazolin is resistant if MIC > or = 8 mcg/mL.(Distinguishing susceptible versus intermediatefor isolates with MIC < or = 4 mcg/mL requiresadditional testing.)For uncomplicated UTI caused by E. coli,K. pneumoniae or P. mirabilis: Cefazolin issusceptible if MIC <32 mcg/mL and predictssusceptible to the oral agents cefaclor, cefdinir,cefpodoxime, cefprozil, cefuroxime, cephalexinand loracarbef.    CEFEPIME <=1 Sensitive     CEFTRIAXONE <=1 Sensitive     CIPROFLOXACIN <=0.25 Sensitive     LEVOFLOXACIN <=0.12 Sensitive     ERTAPENEM <=0.5 Sensitive     GENTAMICIN <=1 Sensitive     IMIPENEM <=0.25 Sensitive     NITROFURANTOIN <=16 Sensitive     PIP/TAZO <=4 Sensitive     TOBRAMYCIN <=1 Sensitive     TRIMETH/SULFA* <=20 Sensitive      *  For infections other than uncomplicated UTIcaused by E. coli, K. pneumoniae or P. mirabilis:Cefazolin is resistant if MIC > or = 8 mcg/mL.(Distinguishing susceptible versus intermediatefor isolates with MIC < or = 4 mcg/mL requiresadditional testing.)For uncomplicated UTI caused by E. coli,K. pneumoniae or P. mirabilis: Cefazolin issusceptible if MIC <32 mcg/mL and predictssusceptible to the oral agents cefaclor, cefdinir,cefpodoxime, cefprozil, cefuroxime, cephalexinand loracarbef.Legend:S = Susceptible  I = IntermediateR = Resistant  NS = Not susceptible* = Not tested  NR = Not reported**NN = See antimicrobic comments  POCT  Urinalysis Dipstick  Result Value Ref Range   Color, UA amber    Clarity, UA clear    Glucose, UA Negative Negative   Bilirubin, UA neg    Ketones, UA neg    Spec Grav, UA 1.010 1.010 - 1.025   Blood, UA trace    pH, UA 5.0 5.0 - 8.0   Protein, UA Negative Negative   Urobilinogen, UA 0.2 0.2 or 1.0 E.U./dL   Nitrite, UA neg    Leukocytes, UA Trace (A) Negative   Appearance     Odor        Assessment & Plan:   Problem List Items Addressed This Visit    None    Visit Diagnoses    Suspected Covid-19 Virus Infection    -  Primary    Patient with positive constellation of symptoms consistent with Covid-19 and onset of symptoms approximately 7 days ago.  No prior testing.  Possible community transmission.  Plan: 1. Recommend Covid-19 testing today. 2. Instructed patient to quarantine at home for next 7-10 days or longer if she remains symptomatic longer. 3. Reviewed emergency signs and symptoms and when to seek care in ED. 4. Follow-up here prn 5-7 days virtually and once symptoms have resolved may reschedule physical/PAP.      - Time spent in direct consultation with patient via telemedicine about above concerns: 5 minutes  Follow up plan: Follow-up prn next 5-7 days for any worsening symptoms.  Cassell Smiles, DNP, AGPCNP-BC Adult Gerontology Primary Care Nurse Practitioner [facility] 09/12/2018, 2:40 PM

## 2018-09-13 LAB — NOVEL CORONAVIRUS, NAA: SARS-CoV-2, NAA: NOT DETECTED

## 2018-09-20 DIAGNOSIS — R69 Illness, unspecified: Secondary | ICD-10-CM | POA: Diagnosis not present

## 2018-09-23 ENCOUNTER — Other Ambulatory Visit: Payer: Self-pay | Admitting: Nurse Practitioner

## 2018-09-23 DIAGNOSIS — Z1231 Encounter for screening mammogram for malignant neoplasm of breast: Secondary | ICD-10-CM

## 2018-10-06 ENCOUNTER — Encounter: Payer: Medicare HMO | Admitting: Nurse Practitioner

## 2018-11-03 ENCOUNTER — Ambulatory Visit: Payer: Medicare HMO | Admitting: Psychiatry

## 2018-11-03 ENCOUNTER — Other Ambulatory Visit: Payer: Self-pay | Admitting: Family Medicine

## 2018-11-03 DIAGNOSIS — N926 Irregular menstruation, unspecified: Secondary | ICD-10-CM

## 2018-11-07 DIAGNOSIS — R69 Illness, unspecified: Secondary | ICD-10-CM | POA: Diagnosis not present

## 2018-11-14 ENCOUNTER — Other Ambulatory Visit
Admission: RE | Admit: 2018-11-14 | Discharge: 2018-11-14 | Disposition: A | Discharge status: Home / Self Care | Payer: Medicare HMO | Source: Ambulatory Visit | Attending: Psychiatry | Admitting: Psychiatry

## 2018-11-14 DIAGNOSIS — Z79899 Other long term (current) drug therapy: Secondary | ICD-10-CM | POA: Insufficient documentation

## 2018-11-14 DIAGNOSIS — R69 Illness, unspecified: Secondary | ICD-10-CM | POA: Diagnosis not present

## 2018-11-14 DIAGNOSIS — F3130 Bipolar disorder, current episode depressed, mild or moderate severity, unspecified: Secondary | ICD-10-CM | POA: Diagnosis present

## 2018-11-14 LAB — TSH: TSH: 2.928 u[IU]/mL (ref 0.350–4.500)

## 2018-11-14 LAB — LITHIUM LEVEL: Lithium Lvl: 0.32 mmol/L — ABNORMAL LOW (ref 0.60–1.20)

## 2018-11-17 ENCOUNTER — Other Ambulatory Visit: Payer: Self-pay

## 2018-11-17 ENCOUNTER — Encounter: Payer: Self-pay | Admitting: Psychiatry

## 2018-11-17 ENCOUNTER — Ambulatory Visit (INDEPENDENT_AMBULATORY_CARE_PROVIDER_SITE_OTHER): Payer: Medicare HMO | Admitting: Psychiatry

## 2018-11-17 DIAGNOSIS — R69 Illness, unspecified: Secondary | ICD-10-CM | POA: Diagnosis not present

## 2018-11-17 DIAGNOSIS — F17200 Nicotine dependence, unspecified, uncomplicated: Secondary | ICD-10-CM

## 2018-11-17 DIAGNOSIS — G2111 Neuroleptic induced parkinsonism: Secondary | ICD-10-CM | POA: Diagnosis not present

## 2018-11-17 DIAGNOSIS — F3130 Bipolar disorder, current episode depressed, mild or moderate severity, unspecified: Secondary | ICD-10-CM | POA: Diagnosis not present

## 2018-11-17 DIAGNOSIS — F1411 Cocaine abuse, in remission: Secondary | ICD-10-CM

## 2018-11-17 DIAGNOSIS — G2581 Restless legs syndrome: Secondary | ICD-10-CM | POA: Insufficient documentation

## 2018-11-17 DIAGNOSIS — F1211 Cannabis abuse, in remission: Secondary | ICD-10-CM

## 2018-11-17 DIAGNOSIS — F4310 Post-traumatic stress disorder, unspecified: Secondary | ICD-10-CM

## 2018-11-17 DIAGNOSIS — F1010 Alcohol abuse, uncomplicated: Secondary | ICD-10-CM

## 2018-11-17 MED ORDER — LITHIUM CARBONATE ER 300 MG PO TBCR: EXTENDED_RELEASE_TABLET | ORAL | 0 refills | Status: DC

## 2018-11-17 MED ORDER — OLANZAPINE 15 MG PO TABS: 15.0000 mg | ORAL_TABLET | Freq: Every day | ORAL | 0 refills | Status: DC

## 2018-11-17 NOTE — Progress Notes (Signed)
Virtual Visit via Video Note  I connected with Vanessa Baker on 11/17/18 at  2:15 PM EDT by a video enabled telemedicine application and verified that I am speaking with the correct person using two identifiers.   I discussed the limitations of evaluation and management by telemedicine and the availability of in person appointments. The patient expressed understanding and agreed to proceed.   I discussed the assessment and treatment plan with the patient. The patient was provided an opportunity to ask questions and all were answered. The patient agreed with the plan and demonstrated an understanding of the instructions.   The patient was advised to call back or seek an in-person evaluation if the symptoms worsen or if the condition fails to improve as anticipated.  [facility] MD OP Progress Note  11/17/2018 2:47 PM Zafiro Juwana Thoreson  MRN:  [id number]  Chief Complaint:  Chief Complaint    Follow-up     HPI: Mazzie is a 43 year old Caucasian female, divorced, lives in [place], has a history of bipolar disorder, PTSD, neuroleptic induced parkinsonism, cocaine use disorder in early remission, tobacco use disorder, cannabis use disorder, hyperlipidemia, seizure disorder, IBS was evaluated by telemedicine today.  A video call was initiated however due to connection problem it had to be changed to a phone call.  Patient today reports she is currently worried about her husband who is going through a surgery for his degenerated disc disease.  She is trying to support him as best as she can.  Patient otherwise reports she is doing okay on the current medication regimen.  She denies any significant mood swings.  She reports appetite and sleep is good.  Patient denies any suicidality, homicidality or perceptual disturbances.  Patient reports she continues to smoke cigarettes and is not willing to quit yet.  She however continues to stay away from alcohol and other drugs.  Reviewed and  discussed lithium level with her.   Visit Diagnosis:    ICD-10-CM   1. Bipolar I disorder, most recent episode depressed ([place])  F31.30   2. PTSD (post-traumatic stress disorder)  F43.10 lithium carbonate (LITHOBID) 300 MG CR tablet    OLANZapine (ZYPREXA) 15 MG tablet   improving  3. Neuroleptic induced Parkinsonism ([place])  G21.11   4. Restless leg syndrome  G25.81   5. Cocaine use disorder, mild, in early remission ([place])  F14.11   6. Alcohol use disorder, mild, abuse  F10.10   7. Cannabis use disorder, mild, in early remission  F12.11   8. Tobacco use disorder  F17.200     Past Psychiatric History: I have reviewed past psychiatric history from my progress note on 04/02/2017.  Past trials of Seroquel, Latuda, Ambien, clonazepam, Topamax, gabapentin, Trintellix, Abilify, Rexulti, Artane.  Past Medical History:  Past Medical History:  Diagnosis Date  . Allergy   . Anxiety   . Asthma   . Bipolar disorder ([place])   . Bursitis   . Depression   . Diabetes mellitus ([place]) 09/2017  . Diabetes mellitus, type II ([place])   . Essential hypertension 03/28/2017  . Frequent headaches   . GERD (gastroesophageal reflux disease)   . Headache   . High cholesterol   . Hypertension   . Irritable bowel syndrome (IBS)   . Irritable colon 02/23/2010  . Migraine 04/02/2017  . Obesity (BMI 30-39.9) 11/25/2013  . Seizure ([place]) [phone]  . Seizures ([place])   . Tremor     Past Surgical History:  Procedure Laterality Date  . NO  PAST SURGERIES      Family Psychiatric History: I have reviewed family psychiatric history from my progress note on 04/02/2017.  Family History:  Family History  Problem Relation Age of Onset  . Bipolar disorder Mother   . Schizophrenia Mother   . Hypertension Mother   . Diabetes Mother   . Cancer Mother   . Anxiety disorder Mother   . ADD / ADHD Mother   . Alcohol abuse Mother   . Drug abuse Mother   . Bipolar disorder Sister   . Anxiety disorder Sister   . Drug abuse  Sister   . Bipolar disorder Brother   . Anxiety disorder Brother   . Drug abuse Brother   . Hypertension Father   . Cancer Father   . Breast cancer Maternal Aunt   . Breast cancer Paternal Aunt   . Breast cancer Maternal Grandmother   . Alcohol abuse Maternal Grandmother   . Breast cancer Paternal Grandmother   . Parkinson's disease Maternal Grandfather   . Alcohol abuse Maternal Grandfather   . Alcohol abuse Maternal Uncle   . ADD / ADHD Son   . Tremor Neg Hx     Social History: I have reviewed social history from my progress note on 04/02/2017. Social History   Socioeconomic History  . Marital status: Married    Spouse name: dewey   . Number of children: 1  . Years of education: 105  . Highest education level: Associate degree: occupational, Hotel manager, or vocational program  Occupational History  . Occupation: disability  Social Needs  . Financial resource strain: Very hard  . Food insecurity    Worry: Often true    Inability: Often true  . Transportation needs    Medical: No    Non-medical: No  Tobacco Use  . Smoking status: Current Every Day Smoker    Packs/day: 0.50    Years: 27.00    Pack years: 13.50    Types: Cigarettes  . Smokeless tobacco: Never Used  Substance and Sexual Activity  . Alcohol use: Not Currently  . Drug use: No  . Sexual activity: Yes    Partners: Male    Birth control/protection: None  Lifestyle  . Physical activity    Days per week: 0 days    Minutes per session: 0 min  . Stress: Very much  Relationships  . Social Herbalist on phone: Never    Gets together: Never    Attends religious service: Never    Active member of club or organization: No    Attends meetings of clubs or organizations: Never    Relationship status: Married  Other Topics Concern  . Not on file  Social History Narrative   Lives with husband, [name] (on [place])   Right-handed   Caffeine use: 1 cup coffee a day, 2 soft drinks per day     Allergies:  Allergies  Allergen Reactions  . Amitriptyline Other (See Comments)    Confusion, paralysis    Metabolic Disorder Labs: Lab Results  Component Value Date   HGBA1C 6.2 (A) 02/21/2018   MPG 128 11/19/2017   MPG 137 08/13/2017   Lab Results  Component Value Date   PROLACTIN 4.6 (L) 04/22/2017   PROLACTIN 10.2 11/10/2015   Lab Results  Component Value Date   CHOL 149 03/05/2018   TRIG 637 (H) 03/05/2018   HDL 26 (L) 03/05/2018   CHOLHDL 5.7 (H) 03/05/2018   [place]  03/05/2018  Comment:     . LDL cholesterol not calculated. Triglyceride levels greater than 400 mg/dL invalidate calculated LDL results. . Reference range: <100 . Desirable range <100 mg/dL for primary prevention;   <70 mg/dL for patients with CHD or diabetic patients  with > or = 2 CHD risk factors. Marland Kitchen LDL-C is now calculated using the Martin-Hopkins  calculation, which is a validated novel method providing  better accuracy than the Friedewald equation in the  estimation of LDL-C.  Cresenciano Genre et al. Annamaria Helling. 1287;867(67): 2061-2068  (http://education.[employer].com/faq/FAQ164)    LDLCALC 81 11/19/2017   Lab Results  Component Value Date   TSH 2.928 11/14/2018   TSH 2.08 11/19/2017    Therapeutic Level Labs: Lab Results  Component Value Date   LITHIUM 0.32 (L) 11/14/2018   LITHIUM 0.33 (L) 06/19/2018   Lab Results  Component Value Date   VALPROATE 61.4 08/13/2017   VALPROATE <10 (L) 04/22/2017   No components found for:  CBMZ  Current Medications: Current Outpatient Medications  Medication Sig Dispense Refill  . ACCU-CHEK SOFTCLIX LANCETS lancets by Other route. Use as instructed    . acetaminophen (TYLENOL) 500 MG tablet Take 500 mg by mouth every 6 (six) hours as needed.    Marland Kitchen albuterol (PROAIR HFA) 108 (90 Base) MCG/ACT inhaler Inhale 2 puffs into the lungs every 4 (four) hours as needed for wheezing or shortness of breath. 8 g 5  . atorvastatin (LIPITOR) 40 MG  tablet Take 1 tablet (40 mg total) by mouth daily. 90 tablet 3  . baclofen (LIORESAL) 10 MG tablet     . benztropine (COGENTIN) 1 MG tablet Take 1 tablet (1 mg total) by mouth 3 (three) times daily. 270 tablet 1  . blood glucose meter kit and supplies Dispense based on patient and insurance preference. Use up to four times daily as directed. (FOR ICD-10 E10.9, E11.9). 1 each 0  . budesonide-formoterol (SYMBICORT) 160-4.5 MCG/ACT inhaler Inhale 2 puffs into the lungs 2 (two) times daily. 1 Inhaler 3  . Calcium Carb-Cholecalciferol (CALCIUM 600+D3 PO) Take 1 tablet by mouth daily.    . clotrimazole (CLOTRIMAZOLE AF) 1 % cream Apply 1 application topically 2 (two) times daily. 30 g 0  . Dulaglutide (TRULICITY) 1.5 MC/9.4BS SOPN Inject 1.5 mg into the skin every 7 (seven) days. 4 pen 12  . esomeprazole (NEXIUM) 20 MG capsule Take 1 capsule (20 mg total) by mouth daily at 12 noon. 90 capsule 3  . fluticasone (FLONASE) 50 MCG/ACT nasal spray Place 2 sprays into both nostrils daily. Use for 4-6 weeks then stop and use seasonally or as needed. 16 g 3  . Fluticasone-Umeclidin-Vilant (TRELEGY ELLIPTA) 100-62.5-25 MCG/INH AEPB Inhale 1 puff into the lungs daily. (Patient not taking: Reported on 09/12/2018) 30 each 5  . gabapentin (NEURONTIN) 300 MG capsule Take 1 capsule (300 mg total) by mouth at bedtime. For restless legs 90 capsule 1  . glucose blood (ACCU-CHEK AVIVA PLUS) test strip USE 1 STRIP TO CHECK GLUCOSE 4 TIMES DAILY AS DIRECTED. 400 each 3  . ibuprofen (ADVIL,MOTRIN) 200 MG tablet Take 400-800 mg by mouth every 6 (six) hours as needed (for pain or headaches).    Lenda Kelp FE 1/20 1-20 MG-MCG tablet Take 1 tablet by mouth once daily 84 tablet 0  . lisinopril (ZESTRIL) 10 MG tablet Take 1 tablet (10 mg total) by mouth daily. 90 tablet 0  . lithium carbonate (LITHOBID) 300 MG CR tablet TAKE 1 TABLET BY MOUTH TWICE DAILY WITH A  MEAL 180 tablet 0  . metFORMIN (GLUCOPHAGE) 500 MG tablet Take 1 tablet (500  mg total) by mouth 2 (two) times daily with a meal. 180 tablet 3  . Multiple Vitamins-Minerals (MULTIVITAMIN ADULT EXTRA C) CHEW Chew by mouth.    . OLANZapine (ZYPREXA) 15 MG tablet Take 1 tablet (15 mg total) by mouth at bedtime. 90 tablet 0  . omega-3 acid ethyl esters (LOVAZA) 1 g capsule Take 1 capsule (1 g total) by mouth 2 (two) times daily. 180 capsule 1  . primidone (MYSOLINE) 50 MG tablet Take 1 tablet (50 mg total) by mouth at bedtime. 90 tablet 1  . sertraline (ZOLOFT) 100 MG tablet Take 1 tablet (100 mg total) by mouth daily. 90 tablet 1  . tiZANidine (ZANAFLEX) 4 MG tablet Take 0.5-1 tablets (2-4 mg total) by mouth every 8 (eight) hours as needed for muscle spasms. 90 tablet 1  . traZODone (DESYREL) 100 MG tablet Take 1.5 tablets (150 mg total) by mouth at bedtime. For sleep 135 tablet 1   No current facility-administered medications for this visit.      Musculoskeletal: Strength & Muscle Tone: UTA Gait & Station: Reports as WNL Patient leans: N/A  Psychiatric Specialty Exam: Review of Systems  Psychiatric/Behavioral: Negative for hallucinations, substance abuse and suicidal ideas. The patient is not nervous/anxious.   All other systems reviewed and are negative.   There were no vitals taken for this visit.There is no height or weight on file to calculate BMI.  General Appearance: UTA  Eye Contact:  UTA  Speech:  Clear and Coherent  Volume:  Normal  Mood:  Euthymic  Affect:  Congruent  Thought Process:  Goal Directed and Descriptions of Associations: Intact  Orientation:  Full (Time, Place, and Person)  Thought Content: Logical   Suicidal Thoughts:  No  Homicidal Thoughts:  No  Memory:  Immediate;   Fair Recent;   Fair Remote;   Fair  Judgement:  Fair  Insight:  Fair  Psychomotor Activity:  Denies rigidity,stiffness  Concentration:  Concentration: Fair and Attention Span: Fair  Recall:  [employer] of Knowledge: Fair  Language: Fair  Akathisia:  No  Handed:   Right  AIMS (if indicated): denies tremors, rigidity  Assets:  Communication Skills Desire for Improvement Social Support  ADL's:  Intact  Cognition: WNL  Sleep:  Fair   Screenings: AIMS     Office Visit from 04/07/2018 in [facility] Office Visit from 01/01/2018 in [facility] Total Score  10  1    GAD-7     Office Visit from 09/06/2017 in [facility]  Total GAD-7 Score  8    PHQ2-9     Office Visit from 07/25/2018 in [facility] Office Visit from 06/09/2018 in [facility] Office Visit from 02/21/2018 in [facility] from 11/19/2017 in [facility] Office Visit from 09/06/2017 in [facility]  PHQ-2 Total Score  2  0  0  4  4  PHQ-9 Total Score  8  -  0  17  18       Assessment and Plan: Tiarra is a 43 year old Caucasian female who has a history of bipolar disorder, PTSD, panic disorder, drug-induced parkinsonism, seizure disorder, diabetes melitis, polysubstance abuse was evaluated by telemedicine today.  Patient is biologically predisposed given her history of trauma as well as multiple medical problems, mental health problems  in her family.  Patient currently has psychosocial stressors of her husband's health issues.  Patient otherwise is doing well on the current medication regimen.  Plan as noted below.  Plan Bipolar disorder- stable Lithium 300 mg p.o. twice daily Lithium level on 11/14/2018-0.32-subtherapeutic.  However her mood symptoms are stable and will not make medication readjustment today. Reviewed TSH-dated 11/14/2018-2.928-within normal limits. Zoloft 100 mg p.o. daily Zyprexa 15 mg p.o. nightly  Panic attacks-stable Zoloft 100 mg p.o. daily  PTSD-stable Zoloft 100 mg p.o. daily   Insomnia- stable Trazodone 100 mg p.o. nightly She will continue to work on sleep hygiene techniques Gabapentin 300  mg p.o. nightly for restless leg syndrome  For drug-induced Parkinson's disease-stable Cogentin 1 mg p.o. 3 times daily  Alcohol use disorder/cocaine and cannabis use disorder-improving She continues to stay sober  Tobacco use disorder-unstable She is not ready to quit.  Provided smoking cessation counseling.  I have reviewed and discussed the following labs- as discussed-lithium level and TSH.  Follow-up in clinic in  2- 3 months or sooner if needed.  January 5 at 4 PM  I have spent atleast 15 minutes non  face to face with patient today. More than 50 % of the time was spent for psychoeducation and supportive psychotherapy and care coordination. This note was generated in part or whole with voice recognition software. Voice recognition is usually quite accurate but there are transcription errors that can and very often do occur. I apologize for any typographical errors that were not detected and corrected.     Ursula Alert, MD 11/17/2018, 2:47 PM

## 2018-11-25 ENCOUNTER — Encounter: Payer: Medicare HMO | Admitting: Nurse Practitioner

## 2018-11-25 ENCOUNTER — Ambulatory Visit: Payer: Medicare HMO

## 2018-12-01 ENCOUNTER — Other Ambulatory Visit: Payer: Self-pay | Admitting: Nurse Practitioner

## 2018-12-01 ENCOUNTER — Other Ambulatory Visit: Payer: Self-pay | Admitting: Psychiatry

## 2018-12-01 DIAGNOSIS — M62830 Muscle spasm of back: Secondary | ICD-10-CM

## 2018-12-01 DIAGNOSIS — E781 Pure hyperglyceridemia: Secondary | ICD-10-CM

## 2018-12-01 DIAGNOSIS — G2111 Neuroleptic induced parkinsonism: Secondary | ICD-10-CM

## 2018-12-01 DIAGNOSIS — E1165 Type 2 diabetes mellitus with hyperglycemia: Secondary | ICD-10-CM

## 2018-12-01 DIAGNOSIS — E785 Hyperlipidemia, unspecified: Secondary | ICD-10-CM

## 2018-12-02 ENCOUNTER — Other Ambulatory Visit: Payer: Self-pay

## 2018-12-02 DIAGNOSIS — I10 Essential (primary) hypertension: Secondary | ICD-10-CM

## 2018-12-02 MED ORDER — LISINOPRIL 10 MG PO TABS: 10.0000 mg | ORAL_TABLET | Freq: Every day | ORAL | 0 refills | Status: DC

## 2018-12-08 ENCOUNTER — Other Ambulatory Visit: Payer: Self-pay | Admitting: Nurse Practitioner

## 2018-12-08 DIAGNOSIS — E781 Pure hyperglyceridemia: Secondary | ICD-10-CM

## 2018-12-08 DIAGNOSIS — M62830 Muscle spasm of back: Secondary | ICD-10-CM

## 2018-12-09 ENCOUNTER — Encounter: Payer: Self-pay | Admitting: Nurse Practitioner

## 2018-12-10 ENCOUNTER — Encounter: Payer: Self-pay | Admitting: Radiology

## 2018-12-10 ENCOUNTER — Ambulatory Visit
Admission: RE | Admit: 2018-12-10 | Discharge: 2018-12-10 | Disposition: A | Discharge status: Home / Self Care | Payer: Medicare HMO | Source: Ambulatory Visit | Attending: Nurse Practitioner | Admitting: Nurse Practitioner

## 2018-12-10 DIAGNOSIS — Z1231 Encounter for screening mammogram for malignant neoplasm of breast: Secondary | ICD-10-CM | POA: Insufficient documentation

## 2018-12-11 ENCOUNTER — Other Ambulatory Visit: Payer: Self-pay | Admitting: Nurse Practitioner

## 2018-12-11 DIAGNOSIS — N6310 Unspecified lump in the right breast, unspecified quadrant: Secondary | ICD-10-CM

## 2018-12-16 ENCOUNTER — Encounter: Payer: Self-pay | Admitting: Family Medicine

## 2018-12-16 ENCOUNTER — Other Ambulatory Visit: Payer: Self-pay

## 2018-12-16 ENCOUNTER — Ambulatory Visit (INDEPENDENT_AMBULATORY_CARE_PROVIDER_SITE_OTHER): Payer: Medicare HMO | Admitting: Family Medicine

## 2018-12-16 ENCOUNTER — Other Ambulatory Visit (HOSPITAL_COMMUNITY)
Admission: RE | Admit: 2018-12-16 | Discharge: 2018-12-16 | Disposition: A | Discharge status: Home / Self Care | Payer: Medicare HMO | Source: Ambulatory Visit | Attending: Family Medicine | Admitting: Family Medicine

## 2018-12-16 VITALS — BP 108/58 | HR 88 | Temp 99.6°F | Resp 16 | Ht 62.0 in | Wt 173.0 lb

## 2018-12-16 DIAGNOSIS — E1165 Type 2 diabetes mellitus with hyperglycemia: Secondary | ICD-10-CM

## 2018-12-16 DIAGNOSIS — Z124 Encounter for screening for malignant neoplasm of cervix: Secondary | ICD-10-CM | POA: Diagnosis not present

## 2018-12-16 DIAGNOSIS — E785 Hyperlipidemia, unspecified: Secondary | ICD-10-CM | POA: Diagnosis not present

## 2018-12-16 DIAGNOSIS — I10 Essential (primary) hypertension: Secondary | ICD-10-CM | POA: Diagnosis not present

## 2018-12-16 DIAGNOSIS — Z0000 Encounter for general adult medical examination without abnormal findings: Secondary | ICD-10-CM | POA: Diagnosis not present

## 2018-12-16 DIAGNOSIS — J4541 Moderate persistent asthma with (acute) exacerbation: Secondary | ICD-10-CM

## 2018-12-16 DIAGNOSIS — G2581 Restless legs syndrome: Secondary | ICD-10-CM

## 2018-12-16 DIAGNOSIS — N926 Irregular menstruation, unspecified: Secondary | ICD-10-CM

## 2018-12-16 DIAGNOSIS — G40909 Epilepsy, unspecified, not intractable, without status epilepticus: Secondary | ICD-10-CM

## 2018-12-16 DIAGNOSIS — J410 Simple chronic bronchitis: Secondary | ICD-10-CM

## 2018-12-16 DIAGNOSIS — N6310 Unspecified lump in the right breast, unspecified quadrant: Secondary | ICD-10-CM | POA: Diagnosis not present

## 2018-12-16 DIAGNOSIS — R69 Illness, unspecified: Secondary | ICD-10-CM | POA: Diagnosis not present

## 2018-12-16 DIAGNOSIS — K219 Gastro-esophageal reflux disease without esophagitis: Secondary | ICD-10-CM

## 2018-12-16 DIAGNOSIS — Z1151 Encounter for screening for human papillomavirus (HPV): Secondary | ICD-10-CM | POA: Diagnosis not present

## 2018-12-16 DIAGNOSIS — Z794 Long term (current) use of insulin: Secondary | ICD-10-CM

## 2018-12-16 MED ORDER — GABAPENTIN 300 MG PO CAPS: 300.0000 mg | ORAL_CAPSULE | Freq: Two times a day (BID) | ORAL | 1 refills | Status: DC

## 2018-12-16 MED ORDER — BACLOFEN 10 MG PO TABS: 5.0000 mg | ORAL_TABLET | Freq: Three times a day (TID) | ORAL | 2 refills | Status: DC | PRN

## 2018-12-16 NOTE — Progress Notes (Signed)
Subjective:    Patient ID: Vanessa Baker, female    DOB: 1975-03-10, 43 y.o.   MRN: [id number]  Vanessa Baker is a 43 y.o. female presenting on 12/16/2018 for Annual Exam   HPI   Here for Annual Physical and Due for Labs.  Wellness/Physical Weight down, some reduced appetite No other concerns.  RLS Gabapentin request to change to BID dosing on new rx  Back Pain Prefers Baclofen instead of Tizanidine, request change.  Health Maintenance:  Breast CA Screening: Due for mammogram. Last mammogram result negative in 2017 was negative. No prior history of abnormal. But fam history of breast cancer. Currently with R breast abnormal lump, sometimes tender. Requesting diagnostic, tried to schedule already needs order.  Due for pap smear, today  Depression screen [facility] 2/9 12/16/2018 07/25/2018 06/09/2018  Decreased Interest 1 1 0  Down, Depressed, Hopeless 2 1 0  PHQ - 2 Score 3 2 0  Altered sleeping 3 2 -  Tired, decreased energy 2 2 -  Change in appetite 1 0 -  Feeling bad or failure about yourself  1 1 -  Trouble concentrating 2 1 -  Moving slowly or fidgety/restless 1 0 -  Suicidal thoughts 0 0 -  PHQ-9 Score 13 8 -  Difficult doing work/chores Somewhat difficult Very difficult -   GAD 7 : Generalized Anxiety Score 12/16/2018 09/06/2017  Nervous, Anxious, on Edge 1 2  Control/stop worrying 1 2  Worry too much - different things 1 1  Trouble relaxing 3 2  Restless 1 0  Easily annoyed or irritable 1 1  Afraid - awful might happen 0 0  Total GAD 7 Score 8 8  Anxiety Difficulty Somewhat difficult Very difficult     Past Medical History:  Diagnosis Date  . Allergy   . Anxiety   . Asthma   . Bipolar disorder ([place])   . Bursitis   . Depression   . Diabetes mellitus ([place]) 09/2017  . Diabetes mellitus, type II ([place])   . Essential hypertension 03/28/2017  . Frequent headaches   . GERD (gastroesophageal reflux disease)   . Headache   . High cholesterol    . Hypertension   . Irritable bowel syndrome (IBS)   . Irritable colon 02/23/2010  . Migraine 04/02/2017  . Obesity (BMI 30-39.9) 11/25/2013  . Seizure ([place]) [phone]  . Seizures ([place])   . Tremor    Past Surgical History:  Procedure Laterality Date  . NO PAST SURGERIES     Social History   Socioeconomic History  . Marital status: Married    Spouse name: dewey   . Number of children: 1  . Years of education: 41  . Highest education level: Associate degree: occupational, Hotel manager, or vocational program  Occupational History  . Occupation: disability  Social Needs  . Financial resource strain: Very hard  . Food insecurity    Worry: Often true    Inability: Often true  . Transportation needs    Medical: No    Non-medical: No  Tobacco Use  . Smoking status: Current Every Day Smoker    Packs/day: 0.50    Years: 27.00    Pack years: 13.50    Types: Cigarettes  . Smokeless tobacco: Never Used  Substance and Sexual Activity  . Alcohol use: Not Currently  . Drug use: No  . Sexual activity: Yes    Partners: Male    Birth control/protection: None  Lifestyle  . Physical activity  Days per week: 0 days    Minutes per session: 0 min  . Stress: Very much  Relationships  . Social Herbalist on phone: Never    Gets together: Never    Attends religious service: Never    Active member of club or organization: No    Attends meetings of clubs or organizations: Never    Relationship status: Married  . Intimate partner violence    Fear of current or ex partner: No    Emotionally abused: No    Physically abused: No    Forced sexual activity: No  Other Topics Concern  . Not on file  Social History Narrative   Lives with husband, [name] (on [place])   Right-handed   Caffeine use: 1 cup coffee a day, 2 soft drinks per day   Family History  Problem Relation Age of Onset  . Bipolar disorder Mother   . Schizophrenia Mother   . Hypertension Mother   .  Diabetes Mother   . Cancer Mother   . Anxiety disorder Mother   . ADD / ADHD Mother   . Alcohol abuse Mother   . Drug abuse Mother   . Bipolar disorder Sister   . Anxiety disorder Sister   . Drug abuse Sister   . Bipolar disorder Brother   . Anxiety disorder Brother   . Drug abuse Brother   . Hypertension Father   . Cancer Father   . Breast cancer Maternal Aunt   . Breast cancer Paternal Aunt   . Breast cancer Maternal Grandmother   . Alcohol abuse Maternal Grandmother   . Breast cancer Paternal Grandmother   . Parkinson's disease Maternal Grandfather   . Alcohol abuse Maternal Grandfather   . Alcohol abuse Maternal Uncle   . ADD / ADHD Son   . Tremor Neg Hx    Current Outpatient Medications on File Prior to Visit  Medication Sig  . ACCU-CHEK SOFTCLIX LANCETS lancets by Other route. Use as instructed  . acetaminophen (TYLENOL) 500 MG tablet Take 500 mg by mouth every 6 (six) hours as needed.  Marland Kitchen albuterol (PROAIR HFA) 108 (90 Base) MCG/ACT inhaler Inhale 2 puffs into the lungs every 4 (four) hours as needed for wheezing or shortness of breath.  Marland Kitchen atorvastatin (LIPITOR) 40 MG tablet Take 1 tablet (40 mg total) by mouth daily.  . benztropine (COGENTIN) 1 MG tablet TAKE 1 TABLET BY MOUTH THREE TIMES DAILY  . blood glucose meter kit and supplies Dispense based on patient and insurance preference. Use up to four times daily as directed. (FOR ICD-10 E10.9, E11.9).  . budesonide-formoterol (SYMBICORT) 160-4.5 MCG/ACT inhaler Inhale 2 puffs into the lungs 2 (two) times daily.  . Calcium Carb-Cholecalciferol (CALCIUM 600+D3 PO) Take 1 tablet by mouth daily.  . clotrimazole (CLOTRIMAZOLE AF) 1 % cream Apply 1 application topically 2 (two) times daily.  . Dulaglutide (TRULICITY) 1.5 UD/1.4HF SOPN Inject 1.5 mg into the skin every 7 (seven) days.  Marland Kitchen esomeprazole (NEXIUM) 20 MG capsule Take 1 capsule (20 mg total) by mouth daily at 12 noon.  . fluticasone (FLONASE) 50 MCG/ACT nasal spray  Place 2 sprays into both nostrils daily. Use for 4-6 weeks then stop and use seasonally or as needed.  . Fluticasone-Umeclidin-Vilant (TRELEGY ELLIPTA) 100-62.5-25 MCG/INH AEPB Inhale 1 puff into the lungs daily.  Marland Kitchen glucose blood (ACCU-CHEK AVIVA PLUS) test strip USE 1 STRIP TO CHECK GLUCOSE 4 TIMES DAILY AS DIRECTED.  Marland Kitchen ibuprofen (ADVIL,MOTRIN) 200  MG tablet Take 400-800 mg by mouth every 6 (six) hours as needed (for pain or headaches).  Lenda Kelp FE 1/20 1-20 MG-MCG tablet Take 1 tablet by mouth once daily  . lisinopril (ZESTRIL) 10 MG tablet Take 1 tablet (10 mg total) by mouth daily.  Marland Kitchen lithium carbonate (LITHOBID) 300 MG CR tablet TAKE 1 TABLET BY MOUTH TWICE DAILY WITH A MEAL  . metFORMIN (GLUCOPHAGE) 500 MG tablet Take 1 tablet (500 mg total) by mouth 2 (two) times daily with a meal.  . Multiple Vitamins-Minerals (MULTIVITAMIN ADULT EXTRA C) CHEW Chew by mouth.  . OLANZapine (ZYPREXA) 15 MG tablet Take 1 tablet (15 mg total) by mouth at bedtime.  Marland Kitchen omega-3 acid ethyl esters (LOVAZA) 1 g capsule Take 1 capsule by mouth twice daily  . primidone (MYSOLINE) 50 MG tablet Take 1 tablet (50 mg total) by mouth at bedtime.  . sertraline (ZOLOFT) 100 MG tablet Take 1 tablet (100 mg total) by mouth daily.  . traZODone (DESYREL) 100 MG tablet Take 1.5 tablets (150 mg total) by mouth at bedtime. For sleep   No current facility-administered medications on file prior to visit.     Review of Systems  Constitutional: Negative for activity change, appetite change, chills, diaphoresis, fatigue and fever.  HENT: Negative for congestion and hearing loss.   Eyes: Negative for visual disturbance.  Respiratory: Negative for apnea, cough, chest tightness, shortness of breath and wheezing.   Cardiovascular: Negative for chest pain, palpitations and leg swelling.  Gastrointestinal: Negative for abdominal pain, anal bleeding, blood in stool, constipation, diarrhea, nausea and vomiting.  Endocrine: Negative for  cold intolerance.  Genitourinary: Negative for difficulty urinating, dysuria, frequency and hematuria.  Musculoskeletal: Negative for arthralgias, back pain and neck pain.  Skin: Negative for rash.       R Breast abnormality  Allergic/Immunologic: Negative for environmental allergies.  Neurological: Negative for dizziness, weakness, light-headedness, numbness and headaches.       RLS  Hematological: Negative for adenopathy.  Psychiatric/Behavioral: Negative for behavioral problems, dysphoric mood and sleep disturbance. The patient is not nervous/anxious.    Per HPI unless specifically indicated above      Objective:    BP (!) 108/58   Pulse 88   Temp 99.6 F (37.6 C) (Oral)   Resp 16   Ht '5\' 2"'$  (1.575 m)   Wt 173 lb (78.5 kg)   LMP 11/26/2018 Comment: pt denies pregnancy-states no period in a year  BMI 31.64 kg/m   Wt Readings from Last 3 Encounters:  12/16/18 173 lb (78.5 kg)  04/01/18 186 lb 6.4 oz (84.6 kg)  03/31/18 185 lb 6.4 oz (84.1 kg)    Physical Exam Vitals signs and nursing note reviewed.  Constitutional:      General: She is not in acute distress.    Appearance: She is well-developed. She is not diaphoretic.     Comments: Well-appearing, comfortable, cooperative  HENT:     Head: Normocephalic and atraumatic.  Eyes:     General:        Right eye: No discharge.        Left eye: No discharge.     Conjunctiva/sclera: Conjunctivae normal.     Pupils: Pupils are equal, round, and reactive to light.  Neck:     Musculoskeletal: Normal range of motion and neck supple.     Thyroid: No thyromegaly.  Cardiovascular:     Rate and Rhythm: Normal rate and regular rhythm.     Heart sounds:  Normal heart sounds. No murmur.  Pulmonary:     Effort: Pulmonary effort is normal. No respiratory distress.     Breath sounds: Normal breath sounds. No wheezing or rales.  Chest:    Abdominal:     General: Bowel sounds are normal. There is no distension.     Palpations:  Abdomen is soft. There is no mass.     Tenderness: There is no abdominal tenderness.  Genitourinary:    Comments: Normal external female genitalia. Vaginal canal without lesions. Normal appearing cervix, without lesions or bleeding. Physiologic discharge on exam. Bimanual exam without masses or cervical motion tenderness. Pap smear specimen collected without difficulty.  Breast & Pelvic Exam chaperoned by Frederich Cha, CMA  Musculoskeletal: Normal range of motion.        General: No tenderness.     Comments: Upper / Lower Extremities: - Normal muscle tone, strength bilateral upper extremities 5/5, lower extremities 5/5  Lymphadenopathy:     Cervical: No cervical adenopathy.  Skin:    General: Skin is warm and dry.     Findings: No erythema or rash.  Neurological:     Mental Status: She is alert and oriented to person, place, and time.     Comments: Distal sensation intact to light touch all extremities  Psychiatric:        Behavior: Behavior normal.     Comments: Well groomed, good eye contact, normal speech and thoughts       I have personally reviewed the radiology report from 2017  CLINICAL DATA:  Screening.  EXAM: 2D DIGITAL SCREENING BILATERAL MAMMOGRAM WITH CAD AND ADJUNCT TOMO  COMPARISON:  Previous exam(s).  ACR Breast Density Category b: There are scattered areas of fibroglandular density.  FINDINGS: There are no findings suspicious for malignancy. Images were processed with CAD.  IMPRESSION: No mammographic evidence of malignancy. A result letter of this screening mammogram will be mailed directly to the patient.  RECOMMENDATION: Screening mammogram in one year. (Code:SM-B-01Y)  BI-RADS CATEGORY  1: Negative.   Electronically Signed   By: Pamelia Hoit M.D.   On: 06/09/2015 11:12  Results for orders placed or performed during the hospital encounter of 11/14/18  Lithium level  Result Value Ref Range   Lithium Lvl 0.32 (L) 0.60 - 1.20 mmol/L   TSH  Result Value Ref Range   TSH 2.928 0.350 - 4.500 uIU/mL      Assessment & Plan:   Problem List Items Addressed This Visit    None    Visit Diagnoses    Annual physical exam    -  Primary   Lump of right breast       Relevant Orders   [place] - RIGHT diag mammo new symptom US BREAST LTD UNI RIGHT INC AXILLA   [place] - diag mammo ALWAYS ADD [place] MM DIAG BREAST TOMO BILATERAL   Cervical cancer screening       Relevant Orders   [facility] - [facility] Pap Cytology - PAP   Restless legs       Relevant Medications   gabapentin (NEURONTIN) 300 MG capsule   baclofen (LIORESAL) 10 MG tablet      Updated Health Maintenance information Pending pap result Ordered dx Mammogram. Pt to schedule. We offered to schedule, she declined today. Reviewed recent lab results with patient Encouraged improvement to lifestyle with diet and exercise - Goal of weight loss   Meds ordered this encounter  Medications  . gabapentin (NEURONTIN) 300 MG capsule  Sig: Take 1 capsule (300 mg total) by mouth 2 (two) times daily. For restless legs    Dispense:  180 capsule    Refill:  1  . baclofen (LIORESAL) 10 MG tablet    Sig: Take 0.5-1 tablets (5-10 mg total) by mouth 3 (three) times daily as needed for muscle spasms.    Dispense:  90 each    Refill:  2     Follow up plan: Return in about 6 months (around 06/15/2019) for 6 month DM A1c follow-up with new provider.   future labs, for Thursday 11/12 CMET LIPID CBC A1c  Nobie Putnam, DO [facility] 12/16/2018, 3:13 PM

## 2018-12-16 NOTE — Patient Instructions (Addendum)
Thank you for coming to the office today.  Gabapentin ordered for twice a day now.  Baclofen re ordered. Stop Tizanidine.   Pap smear today, stay tuned for result.  Blood panel in few days.  For Mammogram screening for breast cancer   Call the [facility] below anytime to schedule your own appointment now that order has been placed.  [facility] [place], [place] [address] Phone: [phone]  -------------------------------------------------   Your provider would like to you have your annual eye exam. Please contact your current eye doctor or here are some good options for you to contact.   [facility]   Address: [address] [place], [place] [address] Phone: [phone]  Website: [email or web address] [address] [place], [place] [address] Phone: [phone] [email or web address]  [facility]  Address: [place], [place], [place] [address] Phone: [phone]   [facility] [address] [place], [place] [address] Phone: [phone]  [facility] Address: [place], [place], [place] [address]  Phone: [phone]  DUE for [place] (no food or drink after midnight before the lab appointment, only water or coffee without cream/sugar on the morning of)  SCHEDULE "Lab Only" visit in the morning at the clinic for lab draw in 1 week  - Make sure Lab Only appointment is at about 1 week before your next appointment, so that results will be available  For Lab Results, once available within 2-3 days of blood draw, you can can log in to MyChart online to view your results and a brief explanation. Also, we can discuss results at next follow-up visit.    Please schedule a Follow-up Appointment to: Return in about 6 months (around 06/15/2019) for 6 month DM A1c follow-up with new provider.  If you have any other questions or  concerns, please feel free to call the office or send a message through [place]. You may also schedule an earlier appointment if necessary.  Additionally, you may be receiving a survey about your experience at our office within a few days to 1 week by e-mail or mail. We value your feedback.  Vanessa Putnam, DO [place]

## 2018-12-17 ENCOUNTER — Telehealth: Payer: Self-pay | Admitting: Family Medicine

## 2018-12-17 ENCOUNTER — Other Ambulatory Visit: Payer: Self-pay | Admitting: Family Medicine

## 2018-12-17 DIAGNOSIS — J4541 Moderate persistent asthma with (acute) exacerbation: Secondary | ICD-10-CM

## 2018-12-17 MED ORDER — PRIMIDONE 50 MG PO TABS: 50.0000 mg | ORAL_TABLET | Freq: Every day | ORAL | 1 refills | Status: DC

## 2018-12-17 MED ORDER — ALBUTEROL SULFATE HFA 108 (90 BASE) MCG/ACT IN AERS: INHALATION_SPRAY | RESPIRATORY_TRACT | 5 refills | Status: DC

## 2018-12-17 MED ORDER — METFORMIN HCL 500 MG PO TABS: 500.0000 mg | ORAL_TABLET | Freq: Two times a day (BID) | ORAL | 3 refills | Status: DC

## 2018-12-17 MED ORDER — ATORVASTATIN CALCIUM 40 MG PO TABS: 40.0000 mg | ORAL_TABLET | Freq: Every day | ORAL | 1 refills | Status: DC

## 2018-12-17 MED ORDER — NORETHIN ACE-ETH ESTRAD-FE 1-20 MG-MCG PO TABS: 1.0000 | ORAL_TABLET | Freq: Every day | ORAL | 0 refills | Status: DC

## 2018-12-17 MED ORDER — ESOMEPRAZOLE MAGNESIUM 20 MG PO CPDR: 20.0000 mg | DELAYED_RELEASE_CAPSULE | Freq: Every day | ORAL | 3 refills | Status: DC

## 2018-12-17 MED ORDER — BUDESONIDE-FORMOTEROL FUMARATE 160-4.5 MCG/ACT IN AERO: 2.0000 | INHALATION_SPRAY | Freq: Two times a day (BID) | RESPIRATORY_TRACT | 3 refills | Status: DC

## 2018-12-17 MED ORDER — TRULICITY 1.5 MG/0.5ML ~~LOC~~ SOAJ: 1.5000 mg | SUBCUTANEOUS | 12 refills | Status: DC

## 2018-12-17 MED ORDER — TRELEGY ELLIPTA 100-62.5-25 MCG/INH IN AEPB: 1.0000 | INHALATION_SPRAY | Freq: Every day | RESPIRATORY_TRACT | 5 refills | Status: DC

## 2018-12-17 NOTE — Telephone Encounter (Signed)
Pharmacy notified.

## 2018-12-17 NOTE — Telephone Encounter (Signed)
Can you call her pharmacy to follow up?  I re ordered her medicine recently, but she is actually no longer on Symbicort (or should not be on it) She should be on Trelegy inhaler instead.  Please let the pharmacy know that she can discontinue the Symbicort and I took it off our list.  She should continue on Trelegy 1 puff daily.  Nobie Putnam, [facility] 12/17/2018, 1:00 PM

## 2018-12-18 ENCOUNTER — Other Ambulatory Visit: Payer: Medicare HMO

## 2018-12-18 LAB — CYTOLOGY - PAP
Comment: NEGATIVE
Diagnosis: NEGATIVE
High risk HPV: NEGATIVE

## 2018-12-19 ENCOUNTER — Other Ambulatory Visit: Payer: Medicare HMO

## 2018-12-19 DIAGNOSIS — E785 Hyperlipidemia, unspecified: Secondary | ICD-10-CM | POA: Diagnosis not present

## 2018-12-19 DIAGNOSIS — E1165 Type 2 diabetes mellitus with hyperglycemia: Secondary | ICD-10-CM | POA: Diagnosis not present

## 2018-12-19 DIAGNOSIS — I10 Essential (primary) hypertension: Secondary | ICD-10-CM | POA: Diagnosis not present

## 2018-12-19 DIAGNOSIS — Z794 Long term (current) use of insulin: Secondary | ICD-10-CM | POA: Diagnosis not present

## 2018-12-19 DIAGNOSIS — Z0000 Encounter for general adult medical examination without abnormal findings: Secondary | ICD-10-CM | POA: Diagnosis not present

## 2018-12-20 LAB — CBC WITH DIFFERENTIAL/PLATELET
Absolute Monocytes: 521 cells/uL (ref 200–950)
Basophils Absolute: 191 cells/uL (ref 0–200)
Basophils Relative: 1.5 %
Eosinophils Absolute: 356 cells/uL (ref 15–500)
Eosinophils Relative: 2.8 %
HCT: 43.5 % (ref 35.0–45.0)
Hemoglobin: 14.4 g/dL (ref 11.7–15.5)
Lymphs Abs: 3277 cells/uL (ref 850–3900)
MCH: 29.5 pg (ref 27.0–33.0)
MCHC: 33.1 g/dL (ref 32.0–36.0)
MCV: 89.1 fL (ref 80.0–100.0)
MPV: 10.2 fL (ref 7.5–12.5)
Monocytes Relative: 4.1 %
Neutro Abs: 8357 cells/uL — ABNORMAL HIGH (ref 1500–7800)
Neutrophils Relative %: 65.8 %
Platelets: 359 10*3/uL (ref 140–400)
RBC: 4.88 10*6/uL (ref 3.80–5.10)
RDW: 13.1 % (ref 11.0–15.0)
Total Lymphocyte: 25.8 %
WBC: 12.7 10*3/uL — ABNORMAL HIGH (ref 3.8–10.8)

## 2018-12-20 LAB — COMPLETE METABOLIC PANEL WITH GFR
AG Ratio: 1.8 (calc) (ref 1.0–2.5)
ALT: 15 U/L (ref 6–29)
AST: 10 U/L (ref 10–30)
Albumin: 4.3 g/dL (ref 3.6–5.1)
Alkaline phosphatase (APISO): 54 U/L (ref 31–125)
BUN: 9 mg/dL (ref 7–25)
CO2: 25 mmol/L (ref 20–32)
Calcium: 9.8 mg/dL (ref 8.6–10.2)
Chloride: 103 mmol/L (ref 98–110)
Creat: 0.68 mg/dL (ref 0.50–1.10)
GFR, Est African American: 123 mL/min/{1.73_m2} (ref >=60)
GFR, Est Non African American: 106 mL/min/{1.73_m2} (ref >=60)
Globulin: 2.4 g/dL (calc) (ref 1.9–3.7)
Glucose, Bld: 148 mg/dL — ABNORMAL HIGH (ref 65–99)
Potassium: 4.4 mmol/L (ref 3.5–5.3)
Sodium: 137 mmol/L (ref 135–146)
Total Bilirubin: 0.2 mg/dL (ref 0.2–1.2)
Total Protein: 6.7 g/dL (ref 6.1–8.1)

## 2018-12-20 LAB — LIPID PANEL
Cholesterol: 112 mg/dL (ref <200)
HDL: 33 mg/dL — ABNORMAL LOW (ref >=50)
LDL Cholesterol (Calc): 54 mg/dL (calc)
Non-HDL Cholesterol (Calc): 79 mg/dL (calc) (ref <130)
Total CHOL/HDL Ratio: 3.4 (calc) (ref <5.0)
Triglycerides: 172 mg/dL — ABNORMAL HIGH (ref <150)

## 2018-12-20 LAB — HEMOGLOBIN A1C
Hgb A1c MFr Bld: 5.3 % of total Hgb (ref <5.7)
Mean Plasma Glucose: 105 (calc)
eAG (mmol/L): 5.8 (calc)

## 2018-12-23 ENCOUNTER — Ambulatory Visit (INDEPENDENT_AMBULATORY_CARE_PROVIDER_SITE_OTHER): Payer: Medicare HMO

## 2018-12-23 ENCOUNTER — Other Ambulatory Visit: Payer: Self-pay | Admitting: Family Medicine

## 2018-12-23 ENCOUNTER — Encounter: Payer: Self-pay | Admitting: Family Medicine

## 2018-12-23 VITALS — Ht 62.0 in | Wt 173.0 lb

## 2018-12-23 DIAGNOSIS — Z0000 Encounter for general adult medical examination without abnormal findings: Secondary | ICD-10-CM | POA: Diagnosis not present

## 2018-12-23 DIAGNOSIS — E669 Obesity, unspecified: Secondary | ICD-10-CM | POA: Insufficient documentation

## 2018-12-23 DIAGNOSIS — E1169 Type 2 diabetes mellitus with other specified complication: Secondary | ICD-10-CM

## 2018-12-23 MED ORDER — ACCU-CHEK SOFTCLIX LANCETS MISC: 1 refills | Status: DC

## 2018-12-23 MED ORDER — ACCU-CHEK AVIVA PLUS VI STRP: ORAL_STRIP | 1 refills | Status: DC

## 2018-12-23 MED ORDER — ACCU-CHEK AVIVA PLUS W/DEVICE KIT: PACK | 0 refills | Status: DC

## 2018-12-23 NOTE — Progress Notes (Signed)
Subjective:   Vanessa Baker is a 43 y.o. female who presents for Medicare Annual (Subsequent) preventive examination.  Review of Systems:   Cardiac Risk Factors include: advanced age (>29mn, >>66women);hypertension     Objective:     Vitals: Ht '5\' 2"'$  (1.575 m)   Wt 173 lb (78.5 kg)   LMP 11/26/2018 Comment: pt denies pregnancy-states no period in a year  BMI 31.64 kg/m   Body mass index is 31.64 kg/m.  Advanced Directives 12/23/2018 11/19/2017  Does Patient Have a Medical Advance Directive? No No  Would patient like information on creating a medical advance directive? - Yes (MAU/Ambulatory/Procedural Areas - Information given)  Some encounter information is confidential and restricted. Go to Review Flowsheets activity to see all data.    Tobacco Social History   Tobacco Use  Smoking Status Current Every Day Smoker  . Packs/day: 1.00  . Years: 27.00  . Pack years: 27.00  . Types: Cigarettes  Smokeless Tobacco Never Used     Ready to quit: No Counseling given: Yes   Clinical Intake:  Pre-visit preparation completed: Yes        Nutritional Risks: None Diabetes: No  How often do you need to have someone help you when you read instructions, pamphlets, or other written materials from your doctor or pharmacy?: 1 - Never     Information entered by :: Arfa Lamarca,LPN  Past Medical History:  Diagnosis Date  . Allergy   . Anxiety   . Asthma   . Bipolar disorder ([place]   . Bursitis   . Depression   . Diabetes mellitus ([facility] 09/2017  . Diabetes mellitus, type II ([facility]   . Essential hypertension 03/28/2017  . Frequent headaches   . GERD (gastroesophageal reflux disease)   . Headache   . High cholesterol   . Hypertension   . Irritable bowel syndrome (IBS)   . Irritable colon 02/23/2010  . Migraine 04/02/2017  . Obesity (BMI 30-39.9) 11/25/2013  . Se[place] ([place] [phone] . Seizures ([place]   . Tremor    Past Surgical History:  Procedure  Laterality Date  . NO PAST SURGERIES     Family History  Problem Relation Age of Onset  . Bipolar disorder Mother   . Schizophrenia Mother   . Hypertension Mother   . Diabetes Mother   . Cancer Mother   . Anxiety disorder Mother   . ADD / ADHD Mother   . Alcohol abuse Mother   . Drug abuse Mother   . Bipolar disorder Sister   . Anxiety disorder Sister   . Drug abuse Sister   . Bipolar disorder Brother   . Anxiety disorder Brother   . Drug abuse Brother   . Hypertension Father   . Cancer Father   . Breast cancer Maternal Aunt   . Breast cancer Paternal Aunt   . Breast cancer Maternal Grandmother   . Alcohol abuse Maternal Grandmother   . Breast cancer Paternal Grandmother   . Parkinson's disease Maternal Grandfather   . Alcohol abuse Maternal Grandfather   . Alcohol abuse Maternal Uncle   . ADD / ADHD Son   . Tremor Neg Hx    Social History   Socioeconomic History  . Marital status: Married    Spouse name: dewey   . Number of children: 1  . Years of education: 145 . Highest education level: Associate degree: occupational, tHotel manager or vocational program  Occupational History  . Occupation: disability  Social  Needs  . Financial resource strain: Very hard  . Food insecurity    Worry: Often true    Inability: Often true  . Transportation needs    Medical: No    Non-medical: No  Tobacco Use  . Smoking status: Current Every Day Smoker    Packs/day: 1.00    Years: 27.00    Pack years: 27.00    Types: Cigarettes  . Smokeless tobacco: Never Used  Substance and Sexual Activity  . Alcohol use: Not Currently  . Drug use: No  . Sexual activity: Yes    Partners: Male    Birth control/protection: None  Lifestyle  . Physical activity    Days per week: 3 days    Minutes per session: 30 min  . Stress: Very much  Relationships  . Social Herbalist on phone: Never    Gets together: Never    Attends religious service: Never    Active member of club or  organization: No    Attends meetings of clubs or organizations: Never    Relationship status: Married  Other Topics Concern  . Not on file  Social History Narrative   Lives with husband, [name] (on [place])   Right-handed   Caffeine use: 1 cup coffee a day, 2 soft drinks per day    Outpatient Encounter Medications as of 12/23/2018  Medication Sig  . ACCU-CHEK SOFTCLIX LANCETS lancets by Other route. Use as instructed  . acetaminophen (TYLENOL) 500 MG tablet Take 500 mg by mouth every 6 (six) hours as needed.  Marland Kitchen albuterol (PROAIR HFA) 108 (90 Base) MCG/ACT inhaler Inhale 2 puffs into the lungs every 4 (four) hours as needed for wheezing or shortness of breath.  Marland Kitchen atorvastatin (LIPITOR) 40 MG tablet Take 1 tablet (40 mg total) by mouth daily.  . baclofen (LIORESAL) 10 MG tablet Take 0.5-1 tablets (5-10 mg total) by mouth 3 (three) times daily as needed for muscle spasms.  . benztropine (COGENTIN) 1 MG tablet TAKE 1 TABLET BY MOUTH THREE TIMES DAILY  . blood glucose meter kit and supplies Dispense based on patient and insurance preference. Use up to four times daily as directed. (FOR ICD-10 E10.9, E11.9).  . Calcium Carb-Cholecalciferol (CALCIUM 600+D3 PO) Take 1 tablet by mouth daily.  . Dulaglutide (TRULICITY) 1.5 QZ/3.0QT SOPN Inject 1.5 mg into the skin every 7 (seven) days.  Marland Kitchen esomeprazole (NEXIUM) 20 MG capsule Take 1 capsule (20 mg total) by mouth daily at 12 noon.  . fluticasone (FLONASE) 50 MCG/ACT nasal spray Place 2 sprays into both nostrils daily. Use for 4-6 weeks then stop and use seasonally or as needed.  . Fluticasone-Umeclidin-Vilant (TRELEGY ELLIPTA) 100-62.5-25 MCG/INH AEPB Inhale 1 puff into the lungs daily.  Marland Kitchen gabapentin (NEURONTIN) 300 MG capsule Take 1 capsule (300 mg total) by mouth 2 (two) times daily. For restless legs  . glucose blood (ACCU-CHEK AVIVA PLUS) test strip USE 1 STRIP TO CHECK GLUCOSE 4 TIMES DAILY AS DIRECTED.  Marland Kitchen ibuprofen (ADVIL,MOTRIN) 200 MG  tablet Take 400-800 mg by mouth every 6 (six) hours as needed (for pain or headaches).  Marland Kitchen lisinopril (ZESTRIL) 10 MG tablet Take 1 tablet (10 mg total) by mouth daily.  Marland Kitchen lithium carbonate (LITHOBID) 300 MG CR tablet TAKE 1 TABLET BY MOUTH TWICE DAILY WITH A MEAL  . metFORMIN (GLUCOPHAGE) 500 MG tablet Take 1 tablet (500 mg total) by mouth 2 (two) times daily with a meal.  . Multiple Vitamins-Minerals (MULTIVITAMIN ADULT EXTRA  C) CHEW Chew by mouth.  . norethindrone-ethinyl estradiol (JUNEL FE 1/20) 1-20 MG-MCG tablet Take 1 tablet by mouth daily.  Marland Kitchen OLANZapine (ZYPREXA) 15 MG tablet Take 1 tablet (15 mg total) by mouth at bedtime.  Marland Kitchen omega-3 acid ethyl esters (LOVAZA) 1 g capsule Take 1 capsule by mouth twice daily  . primidone (MYSOLINE) 50 MG tablet Take 1 tablet (50 mg total) by mouth at bedtime.  . sertraline (ZOLOFT) 100 MG tablet Take 1 tablet (100 mg total) by mouth daily.  . traZODone (DESYREL) 100 MG tablet Take 1.5 tablets (150 mg total) by mouth at bedtime. For sleep  . [DISCONTINUED] clotrimazole (CLOTRIMAZOLE AF) 1 % cream Apply 1 application topically 2 (two) times daily.   No facility-administered encounter medications on file as of 12/23/2018.     Activities of Daily Living In your present state of health, do you have any difficulty performing the following activities: 12/23/2018 12/16/2018  Hearing? N N  Comment no hearing aids -  Vision? N N  Comment eyeglasses, doesnt have an eye dr. Marland Kitchen  Difficulty concentrating or making decisions? Y N  Walking or climbing stairs? N N  Dressing or bathing? N N  Doing errands, shopping? N N  Preparing Food and eating ? N -  Using the Toilet? N -  In the past six months, have you accidently leaked urine? N -  Do you have problems with loss of bowel control? N -  Managing your Medications? N -  Managing your Finances? N -  Housekeeping or managing your Housekeeping? N -  Some recent data might be hidden    Patient Care Team:  Mikey College, NP (Inactive) as PCP - General (Nurse Practitioner) Rico Junker, RN as Registered Nurse Theodore Demark, RN as Registered Nurse    Assessment:   This is a routine wellness examination for Leylani.  Exercise Activities and Dietary recommendations Current Exercise Habits: Home exercise routine, Type of exercise: walking, Time (Minutes): 30, Frequency (Times/Week): 3, Weekly Exercise (Minutes/Week): 90, Intensity: Mild, Exercise limited by: None identified  Goals    . Quit Smoking     Smoking cessation discussed       Fall Risk: Fall Risk  12/23/2018 07/25/2018 02/21/2018 02/21/2018 11/19/2017  Falls in the past year? 0 0 0 0 No  Number falls in past yr: 0 - - - -  Injury with Fall? 0 - - - -  Follow up - - - Falls evaluation completed -    FALL RISK PREVENTION PERTAINING TO THE HOME:  Any stairs in or around the home? Yes  If so, are there any without handrails? No   Home free of loose throw rugs in walkways, pet beds, electrical cords, etc? Yes  Adequate lighting in your home to reduce risk of falls? Yes   ASSISTIVE DEVICES UTILIZED TO PREVENT FALLS:  Life alert? No  Use of a cane, walker or w/c? No  Grab bars in the bathroom? No  Shower chair or bench in shower? No  Elevated toilet seat or a handicapped toilet? No   DME ORDERS:  DME order needed?  No   TIMED UP AND GO:  Unable to perform   Depression Screen PHQ 2/9 Scores 12/16/2018 07/25/2018 06/09/2018 04/01/2018  PHQ - 2 Score 3 2 0 -  PHQ- 9 Score 13 8 - -  Exception Documentation - - - Patient refusal     Cognitive Function     6CIT Screen 11/19/2017  What Year?  0 points  What month? 0 points  What time? 0 points  Count back from 20 0 points  Months in reverse 0 points  Repeat phrase 0 points  Total Score 0    Immunization History  Administered Date(s) Administered  . Influenza Inj Mdck Quad Pf 12/14/2015  . Influenza,inj,Quad PF,6+ Mos 02/28/2017, 11/19/2017,  11/07/2018  . Pneumococcal Polysaccharide-23 11/19/2017  . Tdap 03/08/2017    Qualifies for Shingles Vaccine? No   Tdap: up to date   Flu Vaccine: up to date  Pneumococcal Vaccine: up to date   Screening Tests Health Maintenance  Topic Date Due  . OPHTHALMOLOGY EXAM  01/24/1984  . FOOT EXAM  02/22/2019  . HEMOGLOBIN A1C  06/18/2019  . PAP SMEAR-Modifier  12/15/2021  . TETANUS/TDAP  03/09/2027  . INFLUENZA VACCINE  Completed  . PNEUMOCOCCAL POLYSACCHARIDE VACCINE AGE 62-64 HIGH RISK  Completed  . HIV Screening  Completed    Cancer Screenings:  Colorectal Screening: not indicated   Mammogram: scheduled 12/24/2018  Bone Density: not indicated   Lung Cancer Screening: (Low Dose CT Chest recommended if Age 63-80 years, 30 pack-year currently smoking OR have quit w/in 15years.) does not qualify.    Additional Screening:  Hepatitis C Screening: does not qualify  Vision Screening: Recommended annual ophthalmology exams for early detection of glaucoma and other disorders of the eye. Is the patient up to date with their annual eye exam?  No  Gave recommendations on local eye drs   Dental Screening: Recommended annual dental exams for proper oral hygiene  Community Resource Referral:  CRR required this visit?  No       Plan:  I have personally reviewed and addressed the Medicare Annual Wellness questionnaire and have noted the following in the patient's chart:  A. Medical and social history B. Use of alcohol, tobacco or illicit drugs  C. Current medications and supplements D. Functional ability and status E.  Nutritional status F.  Physical activity G. Advance directives H. List of other physicians I.  Hospitalizations, surgeries, and ER visits in previous 12 months J.  [place] such as hearing and vision if needed, cognitive and depression L. Referrals and appointments   In addition, I have reviewed and discussed with patient certain preventive  protocols, quality metrics, and best practice recommendations. A written personalized care plan for preventive services as well as general preventive health recommendations were provided to patient.  Signed,    Bevelyn Ngo, LPN  11/29/8525 Nurse Health Advisor   Nurse Notes: needs new glucometer. Machine broke, hasnt been able to check BS in 2 months.

## 2018-12-24 ENCOUNTER — Telehealth: Payer: Self-pay | Admitting: Nurse Practitioner

## 2018-12-24 ENCOUNTER — Ambulatory Visit
Admission: RE | Admit: 2018-12-24 | Discharge: 2018-12-24 | Disposition: A | Discharge status: Home / Self Care | Payer: Medicare HMO | Source: Ambulatory Visit | Attending: Family Medicine | Admitting: Family Medicine

## 2018-12-24 ENCOUNTER — Ambulatory Visit
Admission: RE | Admit: 2018-12-24 | Discharge: 2018-12-24 | Disposition: A | Discharge status: Home / Self Care | Payer: Medicare HMO | Source: Ambulatory Visit | Attending: Nurse Practitioner | Admitting: Nurse Practitioner

## 2018-12-24 DIAGNOSIS — Z803 Family history of malignant neoplasm of breast: Secondary | ICD-10-CM | POA: Diagnosis not present

## 2018-12-24 DIAGNOSIS — N6310 Unspecified lump in the right breast, unspecified quadrant: Secondary | ICD-10-CM | POA: Diagnosis present

## 2018-12-24 DIAGNOSIS — N6489 Other specified disorders of breast: Secondary | ICD-10-CM | POA: Diagnosis not present

## 2018-12-24 DIAGNOSIS — R928 Other abnormal and inconclusive findings on diagnostic imaging of breast: Secondary | ICD-10-CM | POA: Diagnosis not present

## 2018-12-24 DIAGNOSIS — N6341 Unspecified lump in right breast, subareolar: Secondary | ICD-10-CM | POA: Insufficient documentation

## 2018-12-24 NOTE — Telephone Encounter (Signed)
° ° °  Called pt regarding Community Resource Referral for assistance paying towards rent and her [employer]. Discussed previous help with paying her rent in 2019 and again in Feb 2020 with [place] gift card both from [facility]. I indicated that we may not be able to get additional assistance with paying her energy bill ($151) but will try.  Set patient up on on ePass so that she can apply for the [place] program on January 2nd emailed her the information and will set reminder to call her in late December to remind her and see if she needs assistance with getting the application completed on that day.  Jerene Pitch, are there other programs that you know of that could assist with paying towards her rent? I told her that a referral had been placed to the team and that someone would be following up to [place] [employer] ??[email or web address]   ??[phone]

## 2018-12-24 NOTE — Telephone Encounter (Signed)
°  From: Jill [facility])  Sent: Wednesday, December 24, 2018 4:47 PM To: [email or web address] Subject: SECURE: EPASS Benefit log in information Importance: High  Ms. Arkwright, Please see information to log into the [place] in order to apply for their [place] Program on January 2nd (I have attached the flyer with more information)  Save this information for future reference  [email or web address] Username:  Password:  Please log in and change your password and keep a log of it so that you will have it.  I will add a calendar reminder closer to that date to remind you that the application will be on that site on Saturday January 2nd.  Please email the [place] documents when you have a minute and I will check to see if the [facility] can assist.  Thank you and look forward to hearing from you,  [place] [employer] ??[email or web address]   ??[phone]

## 2018-12-30 ENCOUNTER — Other Ambulatory Visit: Payer: Self-pay

## 2018-12-30 ENCOUNTER — Encounter: Payer: Self-pay | Admitting: Family Medicine

## 2018-12-30 ENCOUNTER — Ambulatory Visit (INDEPENDENT_AMBULATORY_CARE_PROVIDER_SITE_OTHER): Payer: Medicare HMO | Admitting: Family Medicine

## 2018-12-30 DIAGNOSIS — K581 Irritable bowel syndrome with constipation: Secondary | ICD-10-CM

## 2018-12-30 MED ORDER — LINACLOTIDE 145 MCG PO CAPS: 145.0000 ug | ORAL_CAPSULE | Freq: Every day | ORAL | 2 refills | Status: DC

## 2018-12-30 NOTE — Patient Instructions (Addendum)
Trial on Linzess for IBS constipation - take one daily before meal. If not effective can notify us we can discuss other options or refer back to Gi for consultation  We can schedule future hip injection when ready  Please schedule a Follow-up Appointment to: Return if symptoms worsen or fail to improve.  If you have any other questions or concerns, please feel free to call the office or send a message through [place]. You may also schedule an earlier appointment if necessary.  Additionally, you may be receiving a survey about your experience at our office within a few days to 1 week by e-mail or mail. We value your feedback.  Nobie Putnam, DO [place]

## 2018-12-30 NOTE — Progress Notes (Signed)
Virtual Visit via Telephone The purpose of this virtual visit is to provide medical care while limiting exposure to the novel coronavirus (COVID19) for both patient and office staff.  Consent was obtained for phone visit:  Yes.   Answered questions that patient had about telehealth interaction:  Yes.   I discussed the limitations, risks, security and privacy concerns of performing an evaluation and management service by telephone. I also discussed with the patient that there may be a patient responsible charge related to this service. The patient expressed understanding and agreed to proceed.  Patient Location: Home Provider Location: [facility])  ---------------------------------------------------------------------- Chief Complaint  Patient presents with  . Irritable Bowel Syndrome    S: Reviewed CMA documentation. I have called patient and gathered additional HPI as follows:  IBS-Constipation Reports that symptoms chronic problem with constipation, intermittent IBS related with bloating and cramping discomfort. She was followed by GI specialist in past with diagnosis and has been tried on variety of meds. She tried dicyclomine in past. She has tried miralax some temporary relief. - Requesting trial on Linzess  Denies any high risk travel to areas of current concern for COVID19. Denies any known or suspected exposure to person with or possibly with COVID19.  Denies any fevers, chills, sweats, body ache, cough, shortness of breath, sinus pain or pressure, headache, dark stools or blood per rectum  Past Medical History:  Diagnosis Date  . Allergy   . Anxiety   . Asthma   . Bipolar disorder ([place])   . Bursitis   . Depression   . Essential hypertension 03/28/2017  . Frequent headaches   . GERD (gastroesophageal reflux disease)   . Headache   . High cholesterol   . Hypertension   . Irritable bowel syndrome (IBS)   . Irritable colon 02/23/2010  . Migraine  04/02/2017  . Obesity (BMI 30-39.9) 11/25/2013  . Seizure ([place]) [phone]  . Seizures ([place])   . Tremor    Social History   Tobacco Use  . Smoking status: Current Every Day Smoker    Packs/day: 1.00    Years: 27.00    Pack years: 27.00    Types: Cigarettes  . Smokeless tobacco: Never Used  Substance Use Topics  . Alcohol use: Not Currently  . Drug use: No    Current Outpatient Medications:  .  ACCU-CHEK AVIVA PLUS test strip, USE 1 STRIP TO CHECK GLUCOSE UP TO 3 TIMES DAILY AS DIRECTED., Disp: 300 each, Rfl: 1 .  Accu-Chek Softclix Lancets lancets, Use to check blood sugar up to 3 times daily as instructed, Disp: 300 each, Rfl: 1 .  albuterol (PROAIR HFA) 108 (90 Base) MCG/ACT inhaler, Inhale 2 puffs into the lungs every 4 (four) hours as needed for wheezing or shortness of breath., Disp: 8 g, Rfl: 5 .  atorvastatin (LIPITOR) 40 MG tablet, Take 1 tablet (40 mg total) by mouth daily., Disp: 90 tablet, Rfl: 1 .  baclofen (LIORESAL) 10 MG tablet, Take 0.5-1 tablets (5-10 mg total) by mouth 3 (three) times daily as needed for muscle spasms., Disp: 90 each, Rfl: 2 .  benztropine (COGENTIN) 1 MG tablet, TAKE 1 TABLET BY MOUTH THREE TIMES DAILY, Disp: 270 tablet, Rfl: 0 .  blood glucose meter kit and supplies, Dispense based on patient and insurance preference. Use up to four times daily as directed. (FOR ICD-10 E10.9, E11.9)., Disp: 1 each, Rfl: 0 .  Blood Glucose Monitoring Suppl (ACCU-CHEK AVIVA PLUS) w/Device KIT, Use to check  blood sugar up to 3 times daily, Disp: 1 kit, Rfl: 0 .  Calcium Carb-Cholecalciferol (CALCIUM 600+D3 PO), Take 1 tablet by mouth daily., Disp: , Rfl:  .  Dulaglutide (TRULICITY) 1.5 AS/5.0NL SOPN, Inject 1.5 mg into the skin every 7 (seven) days., Disp: 4 pen, Rfl: 12 .  esomeprazole (NEXIUM) 20 MG capsule, Take 1 capsule (20 mg total) by mouth daily at 12 noon., Disp: 90 capsule, Rfl: 3 .  fluticasone (FLONASE) 50 MCG/ACT nasal spray, Place 2 sprays into both nostrils  daily. Use for 4-6 weeks then stop and use seasonally or as needed., Disp: 16 g, Rfl: 3 .  Fluticasone-Umeclidin-Vilant (TRELEGY ELLIPTA) 100-62.5-25 MCG/INH AEPB, Inhale 1 puff into the lungs daily., Disp: 30 each, Rfl: 5 .  gabapentin (NEURONTIN) 300 MG capsule, Take 1 capsule (300 mg total) by mouth 2 (two) times daily. For restless legs, Disp: 180 capsule, Rfl: 1 .  lisinopril (ZESTRIL) 10 MG tablet, Take 1 tablet (10 mg total) by mouth daily., Disp: 90 tablet, Rfl: 0 .  lithium carbonate (LITHOBID) 300 MG CR tablet, TAKE 1 TABLET BY MOUTH TWICE DAILY WITH A MEAL, Disp: 180 tablet, Rfl: 0 .  metFORMIN (GLUCOPHAGE) 500 MG tablet, Take 1 tablet (500 mg total) by mouth 2 (two) times daily with a meal., Disp: 180 tablet, Rfl: 3 .  Multiple Vitamins-Minerals (MULTIVITAMIN ADULT EXTRA C) CHEW, Chew by mouth., Disp: , Rfl:  .  norethindrone-ethinyl estradiol (JUNEL FE 1/20) 1-20 MG-MCG tablet, Take 1 tablet by mouth daily., Disp: 84 tablet, Rfl: 0 .  OLANZapine (ZYPREXA) 15 MG tablet, Take 1 tablet (15 mg total) by mouth at bedtime., Disp: 90 tablet, Rfl: 0 .  omega-3 acid ethyl esters (LOVAZA) 1 g capsule, Take 1 capsule by mouth twice daily, Disp: 180 capsule, Rfl: 0 .  primidone (MYSOLINE) 50 MG tablet, Take 1 tablet (50 mg total) by mouth at bedtime., Disp: 90 tablet, Rfl: 1 .  sertraline (ZOLOFT) 100 MG tablet, Take 1 tablet (100 mg total) by mouth daily., Disp: 90 tablet, Rfl: 1 .  traZODone (DESYREL) 100 MG tablet, Take 1.5 tablets (150 mg total) by mouth at bedtime. For sleep, Disp: 135 tablet, Rfl: 1 .  acetaminophen (TYLENOL) 500 MG tablet, Take 500 mg by mouth every 6 (six) hours as needed., Disp: , Rfl:  .  ibuprofen (ADVIL,MOTRIN) 200 MG tablet, Take 400-800 mg by mouth every 6 (six) hours as needed (for pain or headaches)., Disp: , Rfl:  .  linaclotide (LINZESS) 145 MCG CAPS capsule, Take 1 capsule (145 mcg total) by mouth daily before breakfast., Disp: 30 capsule, Rfl: 2  Depression  screen [facility] 2/9 12/30/2018 12/16/2018 07/25/2018  Decreased Interest 0 1 1  Down, Depressed, Hopeless 0 2 1  PHQ - 2 Score 0 3 2  Altered sleeping _0 Tired, decreased energy 0 2 2  Change in appetite 0 1 0  Feeling bad or failure about yourself  0 1 1  Trouble concentrating 0 2 1  Moving slowly or fidgety/restless 0 1 0  Suicidal thoughts 0 0 0  PHQ-9 Score _1 Difficult doing work/chores Not difficult at all Somewhat difficult Very difficult    GAD 7 : Generalized Anxiety Score 12/16/2018 09/06/2017  Nervous, Anxious, on Edge 1 2  Control/stop worrying 1 2  Worry too much - different things 1 1  Trouble relaxing 3 2  Restless 1 0  Easily annoyed or irritable 1 1  Afraid - awful might happen  0 0  Total GAD 7 Score 8 8  Anxiety Difficulty Somewhat difficult Very difficult    -------------------------------------------------------------------------- O: No physical exam performed due to remote telephone encounter.  Lab results reviewed.  Recent Results (from the past 2160 hour(s))  Lithium level     Status: Abnormal   Collection Time: 11/14/18 10:08 AM  Result Value Ref Range   Lithium Lvl 0.32 (L) 0.60 - 1.20 mmol/L    Comment: Performed at [facility], [address] [place], [place] [address]  TSH     Status: None   Collection Time: 11/14/18 10:08 AM  Result Value Ref Range   TSH 2.928 0.350 - 4.500 uIU/mL    Comment: Performed by a 3rd Generation assay with a functional sensitivity of <=0.01 uIU/mL. Performed at [facility], [address] [place], [place] [address]   [facility] Pap Cytology - PAP     Status: None   Collection Time: 12/16/18  3:24 PM  Result Value Ref Range   High risk HPV Negative    Adequacy      Satisfactory for evaluation; transformation zone component PRESENT.   Diagnosis      - Negative for intraepithelial lesion or malignancy (NILM)   Comment Normal Reference Range HPV - Negative   [facility] - Lipid panel physical      Status: Abnormal   Collection Time: 12/19/18  8:22 AM  Result Value Ref Range   Cholesterol 112 <200 mg/dL   HDL 33 (L) > OR = 50 mg/dL   Triglycerides 172 (H) <150 mg/dL   LDL Cholesterol (Calc) 54 mg/dL (calc)    Comment: Reference range: <100 . Desirable range <100 mg/dL for primary prevention;   <70 mg/dL for patients with CHD or diabetic patients  with > or = 2 CHD risk factors. Marland Kitchen LDL-C is now calculated using the Martin-Hopkins  calculation, which is a validated novel method providing  better accuracy than the Friedewald equation in the  estimation of LDL-C.  Cresenciano Genre et al. Annamaria Helling. 0347;425(95): 2061-2068  (http://education.[facility].com/faq/FAQ164)    Total CHOL/HDL Ratio 3.4 <5.0 (calc)   Non-HDL Cholesterol (Calc) 79 <130 mg/dL (calc)    Comment: For patients with diabetes plus 1 major ASCVD risk  factor, treating to a non-HDL-C goal of <100 mg/dL  (LDL-C of <70 mg/dL) is considered a therapeutic  option.   [facility] - CMET w/ GFR CMP Complete Metabolic Panel physical     Status: Abnormal   Collection Time: 12/19/18  8:22 AM  Result Value Ref Range   Glucose, Bld 148 (H) 65 - 99 mg/dL    Comment: .            Fasting reference interval . For someone without known diabetes, a glucose value >125 mg/dL indicates that they may have diabetes and this should be confirmed with a follow-up test. .    BUN 9 7 - 25 mg/dL   Creat 0.68 0.50 - 1.10 mg/dL   GFR, Est Non African American 106 > OR = 60 mL/min/1.29m   GFR, Est African American 123 > OR = 60 mL/min/1.759m  BUN/Creatinine Ratio NOT APPLICABLE 6 - 22 (calc)   Sodium 137 135 - 146 mmol/L   Potassium 4.4 3.5 - 5.3 mmol/L   Chloride 103 98 - 110 mmol/L   CO2 25 20 - 32 mmol/L   Calcium 9.8 8.6 - 10.2 mg/dL   Total Protein 6.7 6.1 - 8.1 g/dL   Albumin 4.3 3.6 -  5.1 g/dL   Globulin 2.4 1.9 - 3.7 g/dL (calc)   AG Ratio 1.8 1.0 - 2.5 (calc)   Total Bilirubin 0.2 0.2 - 1.2 mg/dL   Alkaline phosphatase  (APISO) 54 31 - 125 U/L   AST 10 10 - 30 U/L   ALT 15 6 - 29 U/L  [facility] - CBC with Differential/Platelet physical     Status: Abnormal   Collection Time: 12/19/18  8:22 AM  Result Value Ref Range   WBC 12.7 (H) 3.8 - 10.8 Thousand/uL   RBC 4.88 3.80 - 5.10 Million/uL   Hemoglobin 14.4 11.7 - 15.5 g/dL   HCT 43.5 35.0 - 45.0 %   MCV 89.1 80.0 - 100.0 fL   MCH 29.5 27.0 - 33.0 pg   MCHC 33.1 32.0 - 36.0 g/dL   RDW 13.1 11.0 - 15.0 %   Platelets 359 140 - 400 Thousand/uL   MPV 10.2 7.5 - 12.5 fL   Neutro Abs 8,357 (H) 1,500 - 7,800 cells/uL   Lymphs Abs 3,277 850 - 3,900 cells/uL   Absolute Monocytes 521 200 - 950 cells/uL   Eosinophils Absolute 356 15 - 500 cells/uL   Basophils Absolute 191 0 - 200 cells/uL   Neutrophils Relative % 65.8 %   Total Lymphocyte 25.8 %   Monocytes Relative 4.1 %   Eosinophils Relative 2.8 %   Basophils Relative 1.5 %  [facility] - A1c LAB Hemoglobin A1C physical     Status: None   Collection Time: 12/19/18  8:22 AM  Result Value Ref Range   Hgb A1c MFr Bld 5.3 <5.7 % of total Hgb    Comment: For the purpose of screening for the presence of diabetes: . <5.7%       Consistent with the absence of diabetes 5.7-6.4%    Consistent with increased risk for diabetes             (prediabetes) > or =6.5%  Consistent with diabetes . This assay result is consistent with a decreased risk of diabetes. . Currently, no consensus exists regarding use of hemoglobin A1c for diagnosis of diabetes in children. . According to American Diabetes Association (ADA) guidelines, hemoglobin A1c <7.0% represents optimal control in non-pregnant diabetic patients. Different metrics may apply to specific patient populations.  Standards of Medical Care in Diabetes(ADA). .    Mean Plasma Glucose 105 (calc)   eAG (mmol/L) 5.8 (calc)    -------------------------------------------------------------------------- A&P:  Problem List Items Addressed This Visit    Irritable colon -  Primary   Relevant Medications   linaclotide (LINZESS) 145 MCG CAPS capsule     IBS-C predominant Chronic problem, had been followed diagnosed by GI in past Failed Miralax, Dicyclomine, Docusate sodium, fiber, diet changes  Trial now on Linzess 124mg cap daily for IBS Constipation - new rx sent Follow-up as planned if not improving, may require authorization  Meds ordered this encounter  Medications  . linaclotide (LINZESS) 145 MCG CAPS capsule    Sig: Take 1 capsule (145 mcg total) by mouth daily before breakfast.    Dispense:  30 capsule    Refill:  2    Follow-up: - Return as needed for IBS or for hip injection  Patient verbalizes understanding with the above medical recommendations including the limitation of remote medical advice.  Specific follow-up and call-back criteria were given for patient to follow-up or seek medical care more urgently if needed.   - Time spent in direct consultation with patient on phone: 9 minutes  Nobie Putnam, [facility] 12/30/2018, 2:50 PM

## 2018-12-30 NOTE — Telephone Encounter (Signed)
From: Jill Alexanders [facility])  Sent: Tuesday, December 30, 2018 11:39 AM To: [email or web address]) [email or web address]> Subject: C3 Application - Rashan Palermo - 12/30/18  Good Morning Cindy,  Please see attached application for Ms. Mauritz. [employer] has helped her twice once with rent and once with food this application is for [employer] assistance due 12/7. Thank you and let me know if you have any questions,   [place] [facility] ??[email or web address]   ??[phone]

## 2019-01-09 ENCOUNTER — Encounter: Payer: Self-pay | Admitting: Family Medicine

## 2019-01-09 ENCOUNTER — Other Ambulatory Visit: Payer: Self-pay

## 2019-01-09 ENCOUNTER — Ambulatory Visit (INDEPENDENT_AMBULATORY_CARE_PROVIDER_SITE_OTHER): Payer: Medicare HMO | Admitting: Family Medicine

## 2019-01-09 VITALS — BP 127/62 | HR 87 | Temp 97.7°F | Resp 16 | Ht 62.0 in | Wt 175.0 lb

## 2019-01-09 DIAGNOSIS — M25551 Pain in right hip: Secondary | ICD-10-CM

## 2019-01-09 DIAGNOSIS — M25552 Pain in left hip: Secondary | ICD-10-CM

## 2019-01-09 DIAGNOSIS — M7061 Trochanteric bursitis, right hip: Secondary | ICD-10-CM | POA: Diagnosis not present

## 2019-01-09 DIAGNOSIS — M7062 Trochanteric bursitis, left hip: Secondary | ICD-10-CM | POA: Diagnosis not present

## 2019-01-09 DIAGNOSIS — G8929 Other chronic pain: Secondary | ICD-10-CM | POA: Diagnosis not present

## 2019-01-09 DIAGNOSIS — M160 Bilateral primary osteoarthritis of hip: Secondary | ICD-10-CM

## 2019-01-09 MED ORDER — METHYLPREDNISOLONE ACETATE 40 MG/ML IJ SUSP
40.0000 mg | Freq: Once | INTRAMUSCULAR | Status: AC
  Administered 2019-01-09: 40 mg via INTRA_ARTICULAR

## 2019-01-09 MED ORDER — LIDOCAINE HCL (PF) 1 % IJ SOLN
4.0000 mL | Freq: Once | INTRAMUSCULAR | Status: AC
  Administered 2019-01-09: 4 mL

## 2019-01-09 NOTE — Patient Instructions (Addendum)
Thank you for coming to the office today.  You received a bilateral hip joint injection - Lidocaine numbing medicine may ease the pain initially for a few hours until it wears off - As discussed, you may experience a "steroid flare" this evening or within 24-48 hours, anytime medicine is injected into an inflamed joint it can cause the pain to get worse temporarily - Everyone responds differently to these injections, it depends on the patient and the severity of the joint problem, it may provide anywhere from days to weeks, to months of relief. Ideal response is >6 months relief - Try to take it easy for next 1-2 days, avoid over activity and strain on joint (limit walking) - Recommend the following:   - For swelling - rest, compression sleeve / ACE wrap, elevation, and ice packs as needed for first few days   - For pain in future may use heating pad or moist heat as needed  If not improved can refer to Orthopedics  3 to 6 months can repeat injection  Please schedule a Follow-up Appointment to: Return in about 3 months (around 04/09/2019) for 3-6 month follow-up hip pain, may repeat injection.  If you have any other questions or concerns, please feel free to call the office or send a message through [place]. You may also schedule an earlier appointment if necessary.  Additionally, you may be receiving a survey about your experience at our office within a few days to 1 week by e-mail or mail. We value your feedback.  Nobie Putnam, DO [place]

## 2019-01-09 NOTE — Progress Notes (Signed)
Subjective:    Patient ID: Vanessa Baker, female    DOB: 01-17-1976, 43 y.o.   MRN: [id number]  Vanessa Baker is a 43 y.o. female presenting on 01/09/2019 for Hip Pain   HPI   Chronic Hip Pain, bilateral Reports chronic history, previously seen by Orthopedic in past with x-rays dx with arthritis and bone spurs, she has been treated with prednisone in past for trochanteric bursitis with some relief but could only take a few rounds then could not continue prednisone, she was offered injection but did not follow through at that time. Last x-rays 04/2018. Now ready to pursue injection - Describes hip pain bilateral, worse if lay on side of hip at night, or if prolonged standing, activity, some radiating pain but not endorsing nerve pain or tingling numbness, denies any weakness, or trauma or other injury / fall   Health Maintenance: UTD Flu vaccine  Depression screen [facility] 2/9 01/09/2019 12/30/2018 12/16/2018  Decreased Interest 0 0 1  Down, Depressed, Hopeless 0 0 2  PHQ - 2 Score 0 0 3  Altered sleeping 0 2 3  Tired, decreased energy 0 0 2  Change in appetite 0 0 1  Feeling bad or failure about yourself  0 0 1  Trouble concentrating 0 0 2  Moving slowly or fidgety/restless 0 0 1  Suicidal thoughts 0 0 0  PHQ-9 Score 0 2 13  Difficult doing work/chores Not difficult at all Not difficult at all Somewhat difficult  Some recent data might be hidden    Social History   Tobacco Use  . Smoking status: Current Every Day Smoker    Packs/day: 1.00    Years: 27.00    Pack years: 27.00    Types: Cigarettes  . Smokeless tobacco: Never Used  Substance Use Topics  . Alcohol use: Not Currently  . Drug use: No    Review of Systems Per HPI unless specifically indicated above     Objective:    BP 127/62   Pulse 87   Temp 97.7 F (36.5 C) (Oral)   Resp 16   Ht 5\' 2"  (1.575 m)   Wt 175 lb (79.4 kg)   BMI 32.01 kg/m   Wt Readings from Last 3 Encounters:   01/09/19 175 lb (79.4 kg)  12/23/18 173 lb (78.5 kg)  12/16/18 173 lb (78.5 kg)    Physical Exam Vitals signs and nursing note reviewed.  Constitutional:      General: She is not in acute distress.    Appearance: She is well-developed. She is not diaphoretic.     Comments: Well-appearing, comfortable, cooperative  HENT:     Head: Normocephalic and atraumatic.  Eyes:     General:        Right eye: No discharge.        Left eye: No discharge.     Conjunctiva/sclera: Conjunctivae normal.  Cardiovascular:     Rate and Rhythm: Normal rate.  Pulmonary:     Effort: Pulmonary effort is normal.  Musculoskeletal:     Comments: Low Back / bilateral hip and Greater Trochanter Inspection: BACK - Normal appearance, no spinal deformity, symmetrical. HIP - Normal appearance, symmetrical, no obvious leg length or pelvis deformity  Palpation: BACK - No tenderness over spinous processes. Bilateral lumbar paraspinal muscles non-tender and without hypertonicity/spasm.  HIP - Mild tender to palpation deeper bilateral greater trochanter region of lateral upper thigh. Lower extremity thigh calf soft non tender no spasm.  ROM: BACK -  Full active ROM forward flex / back extension, rotation L/R without discomfort HIP - Bilateral hip flex/ext supine normal. She has some slight reduced L>R internal rotation with stiffness and soreness limiting ROM  Special Testing: BACK - Seated SLR negative for radicular pain bilaterally HIP - L hip internal rotation provoked pain. R hip without provoked but has some mild symptoms. Acetabular compression of hip L>R is mild positive for pain  Strength: Bilateral hip flex/ext 5/5, knee flex/ext 5/5, ankle dorsiflex/plantarflex 5/5 Neurovascular: intact distal sensation to light touch   Skin:    General: Skin is warm and dry.     Findings: No erythema or rash.  Neurological:     Mental Status: She is alert and oriented to person, place, and time.  Psychiatric:         Behavior: Behavior normal.     Comments: Well groomed, good eye contact, normal speech and thoughts      ________________________________________________________ PROCEDURE NOTE Date: 01/09/19 Bilateral Hip trochanteric injection Discussed benefits and risks (including pain, bleeding, infection, steroid flare). Verbal consent given by patient. Medication: PER HIP -  1 cc Depo-medrol 40mg  and 4 cc Lidocaine 1% without epi Time Out taken  Trochanteric bursa Patient in lateral decubitus position with affected hip superior. Landmarks identified over RIGHT, then next LEFT greater trochanter. Palpated to identify point of maximum tenderness over bony process. Area cleansed with alcohol wipes. Using 21 gauage and 1, 1/2 inch needle, Initially RIGHT then repeat LEFT trochanteric bursa space was injected (with above listed medication) with needle to bone contact then slightly withdrawn to inject. cold spray used for superficial anesthetic. Sterile bandage placed. Patient tolerated procedure well without bleeding or paresthesias. No complications.   I have personally reviewed the radiology report  CLINICAL DATA:  Bilateral hip pain, unresponsive to recent bilateral hip steroid injections. No reported injury.  EXAM: DG HIP (WITH OR WITHOUT PELVIS) 2-3V LEFT  COMPARISON:  None.  FINDINGS: There is no evidence of hip fracture or dislocation. There is no evidence of arthropathy or other focal bone abnormality.  IMPRESSION: No acute osseous abnormality.  No significant left hip arthropathy.   Electronically Signed   By: Ilona Sorrel M.D.   On: 04/18/2018 16:51  Results for orders placed or performed in visit on 12/18/18  [facility] - Lipid panel physical  Result Value Ref Range   Cholesterol 112 <200 mg/dL   HDL 33 (L) > OR = 50 mg/dL   Triglycerides 172 (H) <150 mg/dL   LDL Cholesterol (Calc) 54 mg/dL (calc)   Total CHOL/HDL Ratio 3.4 <5.0 (calc)   Non-HDL Cholesterol (Calc) 79  <130 mg/dL (calc)  [facility] - CMET w/ GFR CMP Complete Metabolic Panel physical  Result Value Ref Range   Glucose, Bld 148 (H) 65 - 99 mg/dL   BUN 9 7 - 25 mg/dL   Creat 0.68 0.50 - 1.10 mg/dL   GFR, Est Non African American 106 > OR = 60 mL/min/1.11m2   GFR, Est African American 123 > OR = 60 mL/min/1.84m2   BUN/Creatinine Ratio NOT APPLICABLE 6 - 22 (calc)   Sodium 137 135 - 146 mmol/L   Potassium 4.4 3.5 - 5.3 mmol/L   Chloride 103 98 - 110 mmol/L   CO2 25 20 - 32 mmol/L   Calcium 9.8 8.6 - 10.2 mg/dL   Total Protein 6.7 6.1 - 8.1 g/dL   Albumin 4.3 3.6 - 5.1 g/dL   Globulin 2.4 1.9 - 3.7 g/dL (calc)   AG Ratio  1.8 1.0 - 2.5 (calc)   Total Bilirubin 0.2 0.2 - 1.2 mg/dL   Alkaline phosphatase (APISO) 54 31 - 125 U/L   AST 10 10 - 30 U/L   ALT 15 6 - 29 U/L  [facility] - CBC with Differential/Platelet physical  Result Value Ref Range   WBC 12.7 (H) 3.8 - 10.8 Thousand/uL   RBC 4.88 3.80 - 5.10 Million/uL   Hemoglobin 14.4 11.7 - 15.5 g/dL   HCT 43.5 35.0 - 45.0 %   MCV 89.1 80.0 - 100.0 fL   MCH 29.5 27.0 - 33.0 pg   MCHC 33.1 32.0 - 36.0 g/dL   RDW 13.1 11.0 - 15.0 %   Platelets 359 140 - 400 Thousand/uL   MPV 10.2 7.5 - 12.5 fL   Neutro Abs 8,357 (H) 1,500 - 7,800 cells/uL   Lymphs Abs 3,277 850 - 3,900 cells/uL   Absolute Monocytes 521 200 - 950 cells/uL   Eosinophils Absolute 356 15 - 500 cells/uL   Basophils Absolute 191 0 - 200 cells/uL   Neutrophils Relative % 65.8 %   Total Lymphocyte 25.8 %   Monocytes Relative 4.1 %   Eosinophils Relative 2.8 %   Basophils Relative 1.5 %  [facility] - A1c LAB Hemoglobin A1C physical  Result Value Ref Range   Hgb A1c MFr Bld 5.3 <5.7 % of total Hgb   Mean Plasma Glucose 105 (calc)   eAG (mmol/L) 5.8 (calc)      Assessment & Plan:   Problem List Items Addressed This Visit    Chronic hip pain, bilateral - Primary    Other Visit Diagnoses    Primary osteoarthritis of both hips       Relevant Medications   lidocaine (PF) (XYLOCAINE) 1  % injection 4 mL (Completed)   methylPREDNISolone acetate (DEPO-MEDROL) injection 40 mg (Completed)   methylPREDNISolone acetate (DEPO-MEDROL) injection 40 mg (Completed)   lidocaine (PF) (XYLOCAINE) 1 % injection 4 mL (Completed)   Trochanteric bursitis of both hips       Relevant Medications   lidocaine (PF) (XYLOCAINE) 1 % injection 4 mL (Completed)   methylPREDNISolone acetate (DEPO-MEDROL) injection 40 mg (Completed)   methylPREDNISolone acetate (DEPO-MEDROL) injection 40 mg (Completed)   lidocaine (PF) (XYLOCAINE) 1 % injection 4 mL (Completed)      Meds ordered this encounter  Medications  . lidocaine (PF) (XYLOCAINE) 1 % injection 4 mL  . methylPREDNISolone acetate (DEPO-MEDROL) injection 40 mg  . methylPREDNISolone acetate (DEPO-MEDROL) injection 40 mg  . lidocaine (PF) (XYLOCAINE) 1 % injection 4 mL    Subacute on chronic gradual worsening problem with L > R Hip pain, likely trochanteric bursitis given history and exam with point tenderness. Suspect underlying mild osteoarthritis as possible cause with known prior x-ray and prior ortho work-up in past. - No radiation of pain or radicular symptoms not responding to conservative therapy  Plan: 1. Bilateral trochanteric bursa steroid injections performed today, see procedure notes, tolerated well. - May take oral NSAID PRN, Gabapentin, Tylenol, topical relief, ice/heat  Follow-up 4-6 weeks if not improving consider repeat injection series or in 3-6 month otherwise refer back to Ortho - consider intra-articular injection   Follow up plan: Return in about 3 months (around 04/09/2019) for 3-6 month follow-up hip pain, may repeat injection.   Nobie Putnam, DO [facility] 01/09/2019, 2:52 PM

## 2019-01-16 ENCOUNTER — Other Ambulatory Visit: Payer: Self-pay

## 2019-01-16 DIAGNOSIS — Z20822 Contact with and (suspected) exposure to covid-19: Secondary | ICD-10-CM

## 2019-01-16 NOTE — Telephone Encounter (Signed)
Documentation in routing note from [place]  I called the patient back and informed her that we currently do not have a provider in the office. I express my concerns for the runny nose,coughing, aching and intermittent diarrhea although the diarrhea been intermittent for 2 mths that she needs a COVID test. I gave her the information for the testing sites. I also informed the patient that she could be evaluated at [facility] as well. She verbalize understanding, no questions or concerns.

## 2019-01-16 NOTE — Telephone Encounter (Signed)
Please see previous message

## 2019-01-18 DIAGNOSIS — R197 Diarrhea, unspecified: Secondary | ICD-10-CM | POA: Insufficient documentation

## 2019-01-18 DIAGNOSIS — E1169 Type 2 diabetes mellitus with other specified complication: Secondary | ICD-10-CM | POA: Diagnosis not present

## 2019-01-18 DIAGNOSIS — K582 Mixed irritable bowel syndrome: Secondary | ICD-10-CM | POA: Diagnosis not present

## 2019-01-18 DIAGNOSIS — R69 Illness, unspecified: Secondary | ICD-10-CM | POA: Diagnosis not present

## 2019-01-18 LAB — NOVEL CORONAVIRUS, NAA: SARS-CoV-2, NAA: NOT DETECTED
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
## 2019-02-03 ENCOUNTER — Encounter: Payer: Self-pay | Admitting: Family Medicine

## 2019-02-03 ENCOUNTER — Ambulatory Visit (INDEPENDENT_AMBULATORY_CARE_PROVIDER_SITE_OTHER): Payer: Medicare HMO | Admitting: Family Medicine

## 2019-02-03 ENCOUNTER — Other Ambulatory Visit: Payer: Self-pay

## 2019-02-03 DIAGNOSIS — E1169 Type 2 diabetes mellitus with other specified complication: Secondary | ICD-10-CM | POA: Diagnosis not present

## 2019-02-03 DIAGNOSIS — R197 Diarrhea, unspecified: Secondary | ICD-10-CM | POA: Diagnosis not present

## 2019-02-03 DIAGNOSIS — K581 Irritable bowel syndrome with constipation: Secondary | ICD-10-CM

## 2019-02-03 NOTE — Progress Notes (Signed)
Virtual Visit via Telephone The purpose of this virtual visit is to provide medical care while limiting exposure to the novel coronavirus (COVID19) for both patient and office staff.  Consent was obtained for phone visit:  Yes.   Answered questions that patient had about telehealth interaction:  Yes.   I discussed the limitations, risks, security and privacy concerns of performing an evaluation and management service by telephone. I also discussed with the patient that there may be a patient responsible charge related to this service. The patient expressed understanding and agreed to proceed.  Patient Location: Home Provider Location: [facility])  ---------------------------------------------------------------------- Chief Complaint  Patient presents with  . Diarrhea    onset 2-3 months alternative or intermitten onset denies nausea, felt weak and exhausted as per patient     S: Reviewed CMA documentation. I have called patient and gathered additional HPI as follows:  IBS-Constipation-Diarrhea mixed - Last visit with me 12/30/18, for initial visit for same problem IBS, treated with Linzess rx for IBS constipation, see prior notes for background information. - Interval update with changed diet - Today patient reports worsening loose watery stool diarrhea episodic, for 1-2 months. Onset before started Linzess which has helped her constipation more regular formed stools less bloating. She was followed by GI specialist in past with diagnosis and has been tried on variety of meds. She tried dicyclomine in past. She has tried miralax some temporary relief. Asking of Metformin can be causing GI side effect or Trulicity  CHRONIC DM, Type 2: Last A1c 5.3 (12/2018) Not checking sugar often Meds: Metformin '500mg'$  BID, Trulicity 1.'5mg'$  weekly Reports good compliance. Tolerating well w/o side-effects Denies hypoglycemia, polyuria, visual changes, numbness or  tingling.  Denies any high risk travel to areas of current concern for COVID19. Denies any known or suspected exposure to person with or possibly with COVID19.  Denies any fevers, chills, sweats, body ache, cough, shortness of breath, sinus pain or pressure, headache, abdominal pain  Past Medical History:  Diagnosis Date  . Allergy   . Anxiety   . Asthma   . Bipolar disorder ([place])   . Bursitis   . Depression   . Essential hypertension 03/28/2017  . Frequent headaches   . GERD (gastroesophageal reflux disease)   . Headache   . High cholesterol   . Hypertension   . Irritable bowel syndrome (IBS)   . Irritable colon 02/23/2010  . Migraine 04/02/2017  . Obesity (BMI 30-39.9) 11/25/2013  . Seizure ([place]) [phone]  . Seizures ([place])   . Tremor    Social History   Tobacco Use  . Smoking status: Current Every Day Smoker    Packs/day: 1.00    Years: 27.00    Pack years: 27.00    Types: Cigarettes  . Smokeless tobacco: Never Used  Substance Use Topics  . Alcohol use: Not Currently  . Drug use: No    Current Outpatient Medications:  .  ACCU-CHEK AVIVA PLUS test strip, USE 1 STRIP TO CHECK GLUCOSE UP TO 3 TIMES DAILY AS DIRECTED., Disp: 300 each, Rfl: 1 .  Accu-Chek Softclix Lancets lancets, Use to check blood sugar up to 3 times daily as instructed, Disp: 300 each, Rfl: 1 .  acetaminophen (TYLENOL) 500 MG tablet, Take 500 mg by mouth every 6 (six) hours as needed., Disp: , Rfl:  .  albuterol (PROAIR HFA) 108 (90 Base) MCG/ACT inhaler, Inhale 2 puffs into the lungs every 4 (four) hours as needed for wheezing or shortness  of breath., Disp: 8 g, Rfl: 5 .  atorvastatin (LIPITOR) 40 MG tablet, Take 1 tablet (40 mg total) by mouth daily., Disp: 90 tablet, Rfl: 1 .  baclofen (LIORESAL) 10 MG tablet, Take 0.5-1 tablets (5-10 mg total) by mouth 3 (three) times daily as needed for muscle spasms., Disp: 90 each, Rfl: 2 .  benztropine (COGENTIN) 1 MG tablet, TAKE 1 TABLET BY MOUTH THREE TIMES  DAILY, Disp: 270 tablet, Rfl: 0 .  blood glucose meter kit and supplies, Dispense based on patient and insurance preference. Use up to four times daily as directed. (FOR ICD-10 E10.9, E11.9)., Disp: 1 each, Rfl: 0 .  Blood Glucose Monitoring Suppl (ACCU-CHEK AVIVA PLUS) w/Device KIT, Use to check blood sugar up to 3 times daily, Disp: 1 kit, Rfl: 0 .  Calcium Carb-Cholecalciferol (CALCIUM 600+D3 PO), Take 1 tablet by mouth daily., Disp: , Rfl:  .  Dulaglutide (TRULICITY) 1.5 MG/5.0IB SOPN, Inject 1.5 mg into the skin every 7 (seven) days., Disp: 4 pen, Rfl: 12 .  esomeprazole (NEXIUM) 20 MG capsule, Take 1 capsule (20 mg total) by mouth daily at 12 noon., Disp: 90 capsule, Rfl: 3 .  fluticasone (FLONASE) 50 MCG/ACT nasal spray, Place 2 sprays into both nostrils daily. Use for 4-6 weeks then stop and use seasonally or as needed., Disp: 16 g, Rfl: 3 .  Fluticasone-Umeclidin-Vilant (TRELEGY ELLIPTA) 100-62.5-25 MCG/INH AEPB, Inhale 1 puff into the lungs daily., Disp: 30 each, Rfl: 5 .  gabapentin (NEURONTIN) 300 MG capsule, Take 1 capsule (300 mg total) by mouth 2 (two) times daily. For restless legs, Disp: 180 capsule, Rfl: 1 .  ibuprofen (ADVIL,MOTRIN) 200 MG tablet, Take 400-800 mg by mouth every 6 (six) hours as needed (for pain or headaches)., Disp: , Rfl:  .  linaclotide (LINZESS) 145 MCG CAPS capsule, Take 1 capsule (145 mcg total) by mouth daily before breakfast., Disp: 30 capsule, Rfl: 2 .  lisinopril (ZESTRIL) 10 MG tablet, Take 1 tablet (10 mg total) by mouth daily., Disp: 90 tablet, Rfl: 0 .  lithium carbonate (LITHOBID) 300 MG CR tablet, TAKE 1 TABLET BY MOUTH TWICE DAILY WITH A MEAL, Disp: 180 tablet, Rfl: 0 .  Multiple Vitamins-Minerals (MULTIVITAMIN ADULT EXTRA C) CHEW, Chew by mouth., Disp: , Rfl:  .  norethindrone-ethinyl estradiol (JUNEL FE 1/20) 1-20 MG-MCG tablet, Take 1 tablet by mouth daily., Disp: 84 tablet, Rfl: 0 .  OLANZapine (ZYPREXA) 15 MG tablet, Take 1 tablet (15 mg total)  by mouth at bedtime., Disp: 90 tablet, Rfl: 0 .  omega-3 acid ethyl esters (LOVAZA) 1 g capsule, Take 1 capsule by mouth twice daily, Disp: 180 capsule, Rfl: 0 .  primidone (MYSOLINE) 50 MG tablet, Take 1 tablet (50 mg total) by mouth at bedtime., Disp: 90 tablet, Rfl: 1 .  sertraline (ZOLOFT) 100 MG tablet, Take 1 tablet (100 mg total) by mouth daily., Disp: 90 tablet, Rfl: 1 .  traZODone (DESYREL) 100 MG tablet, Take 1.5 tablets (150 mg total) by mouth at bedtime. For sleep, Disp: 135 tablet, Rfl: 1  Depression screen [facility] 2/9 01/09/2019 12/30/2018 12/16/2018  Decreased Interest 0 0 1  Down, Depressed, Hopeless 0 0 2  PHQ - 2 Score 0 0 3  Altered sleeping 0 2 3  Tired, decreased energy 0 0 2  Change in appetite 0 0 1  Feeling bad or failure about yourself  0 0 1  Trouble concentrating 0 0 2  Moving slowly or fidgety/restless 0 0 1  Suicidal thoughts 0  0 0  PHQ-9 Score 0 2 13  Difficult doing work/chores Not difficult at all Not difficult at all Somewhat difficult  Some recent data might be hidden    GAD 7 : Generalized Anxiety Score 12/16/2018 09/06/2017  Nervous, Anxious, on Edge 1 2  Control/stop worrying 1 2  Worry too much - different things 1 1  Trouble relaxing 3 2  Restless 1 0  Easily annoyed or irritable 1 1  Afraid - awful might happen 0 0  Total GAD 7 Score 8 8  Anxiety Difficulty Somewhat difficult Very difficult    -------------------------------------------------------------------------- O: No physical exam performed due to remote telephone encounter.  Lab results reviewed.  Recent Results (from the past 2160 hour(s))  Lithium level     Status: Abnormal   Collection Time: 11/14/18 10:08 AM  Result Value Ref Range   Lithium Lvl 0.32 (L) 0.60 - 1.20 mmol/L    Comment: Performed at [facility], [address] [place], [place] [address]  TSH     Status: None   Collection Time: 11/14/18 10:08 AM  Result Value Ref Range   TSH 2.928 0.350 - 4.500  uIU/mL    Comment: Performed by a 3rd Generation assay with a functional sensitivity of <=0.01 uIU/mL. Performed at [facility], [address] [place], [place] [address]   [facility] - [facility] Pap Cytology - PAP     Status: None   Collection Time: 12/16/18  3:24 PM  Result Value Ref Range   High risk HPV Negative    Adequacy      Satisfactory for evaluation; transformation zone component PRESENT.   Diagnosis      - Negative for intraepithelial lesion or malignancy (NILM)   Comment Normal Reference Range HPV - Negative   [facility] - Lipid panel physical     Status: Abnormal   Collection Time: 12/19/18  8:22 AM  Result Value Ref Range   Cholesterol 112 <200 mg/dL   HDL 33 (L) > OR = 50 mg/dL   Triglycerides 172 (H) <150 mg/dL   LDL Cholesterol (Calc) 54 mg/dL (calc)    Comment: Reference range: <100 . Desirable range <100 mg/dL for primary prevention;   <70 mg/dL for patients with CHD or diabetic patients  with > or = 2 CHD risk factors. Marland Kitchen LDL-C is now calculated using the Martin-Hopkins  calculation, which is a validated novel method providing  better accuracy than the Friedewald equation in the  estimation of LDL-C.  Cresenciano Genre et al. Annamaria Helling. 6283;662(94): 2061-2068  (http://education.[facility].com/faq/FAQ164)    Total CHOL/HDL Ratio 3.4 <5.0 (calc)   Non-HDL Cholesterol (Calc) 79 <130 mg/dL (calc)    Comment: For patients with diabetes plus 1 major ASCVD risk  factor, treating to a non-HDL-C goal of <100 mg/dL  (LDL-C of <70 mg/dL) is considered a therapeutic  option.   [facility] - CMET w/ GFR CMP Complete Metabolic Panel physical     Status: Abnormal   Collection Time: 12/19/18  8:22 AM  Result Value Ref Range   Glucose, Bld 148 (H) 65 - 99 mg/dL    Comment: .            Fasting reference interval . For someone without known diabetes, a glucose value >125 mg/dL indicates that they may have diabetes and this should be confirmed with a follow-up test. .    BUN 9 7 -  25 mg/dL   Creat 0.68 0.50 - 1.10 mg/dL   GFR, Est Non African  American 106 > OR = 60 mL/min/1.35m   GFR, Est African American 123 > OR = 60 mL/min/1.784m  BUN/Creatinine Ratio NOT APPLICABLE 6 - 22 (calc)   Sodium 137 135 - 146 mmol/L   Potassium 4.4 3.5 - 5.3 mmol/L   Chloride 103 98 - 110 mmol/L   CO2 25 20 - 32 mmol/L   Calcium 9.8 8.6 - 10.2 mg/dL   Total Protein 6.7 6.1 - 8.1 g/dL   Albumin 4.3 3.6 - 5.1 g/dL   Globulin 2.4 1.9 - 3.7 g/dL (calc)   AG Ratio 1.8 1.0 - 2.5 (calc)   Total Bilirubin 0.2 0.2 - 1.2 mg/dL   Alkaline phosphatase (APISO) 54 31 - 125 U/L   AST 10 10 - 30 U/L   ALT 15 6 - 29 U/L  [facility] - CBC with Differential/Platelet physical     Status: Abnormal   Collection Time: 12/19/18  8:22 AM  Result Value Ref Range   WBC 12.7 (H) 3.8 - 10.8 Thousand/uL   RBC 4.88 3.80 - 5.10 Million/uL   Hemoglobin 14.4 11.7 - 15.5 g/dL   HCT 43.5 35.0 - 45.0 %   MCV 89.1 80.0 - 100.0 fL   MCH 29.5 27.0 - 33.0 pg   MCHC 33.1 32.0 - 36.0 g/dL   RDW 13.1 11.0 - 15.0 %   Platelets 359 140 - 400 Thousand/uL   MPV 10.2 7.5 - 12.5 fL   Neutro Abs 8,357 (H) 1,500 - 7,800 cells/uL   Lymphs Abs 3,277 850 - 3,900 cells/uL   Absolute Monocytes 521 200 - 950 cells/uL   Eosinophils Absolute 356 15 - 500 cells/uL   Basophils Absolute 191 0 - 200 cells/uL   Neutrophils Relative % 65.8 %   Total Lymphocyte 25.8 %   Monocytes Relative 4.1 %   Eosinophils Relative 2.8 %   Basophils Relative 1.5 %  [facility] - A1c LAB Hemoglobin A1C physical     Status: None   Collection Time: 12/19/18  8:22 AM  Result Value Ref Range   Hgb A1c MFr Bld 5.3 <5.7 % of total Hgb    Comment: For the purpose of screening for the presence of diabetes: . <5.7%       Consistent with the absence of diabetes 5.7-6.4%    Consistent with increased risk for diabetes             (prediabetes) > or =6.5%  Consistent with diabetes . This assay result is consistent with a decreased risk of diabetes. . Currently,  no consensus exists regarding use of hemoglobin A1c for diagnosis of diabetes in children. . According to American Diabetes Association (ADA) guidelines, hemoglobin A1c <7.0% represents optimal control in non-pregnant diabetic patients. Different metrics may apply to specific patient populations.  Standards of Medical Care in Diabetes(ADA). .    Mean Plasma Glucose 105 (calc)   eAG (mmol/L) 5.8 (calc)  Novel Coronavirus, NAA ([facility])     Status: None   Collection Time: 01/16/19 12:29 PM   Specimen: Nasopharyngeal(NP) swabs in vial transport medium   NASOPHARYNGE  TESTING  Result Value Ref Range   SARS-CoV-2, NAA Not Detected Not Detected    Comment: This nucleic acid amplification test was developed and its performance characteristics determined by La[employer], Dickinson and CompanyNucleic acid amplification tests include PCR and TMA. This test has not been FDA cleared or approved. This test has been authorized by FDA under an Emergency Use Authorization (EUA). This test is only authorized for the duration of  time the declaration that circumstances exist justifying the authorization of the emergency use of in vitro diagnostic tests for detection of SARS-CoV-2 virus and/or diagnosis of COVID-19 infection under section 564(b)(1) of the Act, 21 U.S.C. 694PTC-0(F) (1), unless the authorization is terminated or revoked sooner. When diagnostic testing is negative, the possibility of a false negative result should be considered in the context of a patient's recent exposures and the presence of clinical signs and symptoms consistent with COVID-19. An individual without symptoms of COVID-19 and who is not shedding SARS-CoV-2 virus would  expect to have a negative (not detected) result in this assay.     -------------------------------------------------------------------------- A&P:  Problem List Items Addressed This Visit    Type 2 diabetes mellitus with other specified complication ([place]) -  Primary   Irritable colon    Other Visit Diagnoses    Diarrhea, unspecified type         Well controlled DM2, last A1c 5.3 On metformin and Trulicity, with GI intolerance diarrhea, suspected side effect from metformin at this point, can also be GI IBS mixed symptoms, but improved on linzess for constipation  Trial OFF metformin 500 BID - HOLD for now to see if GI intolerance diarrhea resolves Continue Trulicity Check CBGs more often fasting AM Continue Linzess Continue improving diet for IBS  Follow-up if not improved, consider GI referral  No orders of the defined types were placed in this encounter.   Follow-up: - Return in 3 months for DM A1c / IBS  Patient verbalizes understanding with the above medical recommendations including the limitation of remote medical advice.  Specific follow-up and call-back criteria were given for patient to follow-up or seek medical care more urgently if needed.   - Time spent in direct consultation with patient on phone: 9 minutes   Nobie Putnam, [facility] 02/03/2019, 9:31 AM

## 2019-02-03 NOTE — Patient Instructions (Addendum)
Stop Metformin for now to see if it was causing or contributing to the diarrhea Continue Linzess Check blood sugar x 1 daily in AM fasting. Can do again later if need 2 hour after meal After 1 month call us if you still need to see GI specialist with referral  Please schedule a Follow-up Appointment to: Return in about 3 months (around 05/04/2019) for DM A1c, IBS w/ new provider.  If you have any other questions or concerns, please feel free to call the office or send a message through [place]. You may also schedule an earlier appointment if necessary.  Additionally, you may be receiving a survey about your experience at our office within a few days to 1 week by e-mail or mail. We value your feedback.  Nobie Putnam, DO [place]

## 2019-02-10 ENCOUNTER — Ambulatory Visit (INDEPENDENT_AMBULATORY_CARE_PROVIDER_SITE_OTHER): Payer: Medicare HMO | Admitting: Psychiatry

## 2019-02-10 ENCOUNTER — Other Ambulatory Visit: Payer: Self-pay

## 2019-02-10 ENCOUNTER — Encounter: Payer: Self-pay | Admitting: Psychiatry

## 2019-02-10 DIAGNOSIS — F4310 Post-traumatic stress disorder, unspecified: Secondary | ICD-10-CM | POA: Diagnosis not present

## 2019-02-10 DIAGNOSIS — F1421 Cocaine dependence, in remission: Secondary | ICD-10-CM

## 2019-02-10 DIAGNOSIS — F17200 Nicotine dependence, unspecified, uncomplicated: Secondary | ICD-10-CM

## 2019-02-10 DIAGNOSIS — G2581 Restless legs syndrome: Secondary | ICD-10-CM | POA: Diagnosis not present

## 2019-02-10 DIAGNOSIS — F1010 Alcohol abuse, uncomplicated: Secondary | ICD-10-CM

## 2019-02-10 DIAGNOSIS — F3130 Bipolar disorder, current episode depressed, mild or moderate severity, unspecified: Secondary | ICD-10-CM | POA: Diagnosis not present

## 2019-02-10 DIAGNOSIS — R69 Illness, unspecified: Secondary | ICD-10-CM | POA: Diagnosis not present

## 2019-02-10 DIAGNOSIS — F1211 Cannabis abuse, in remission: Secondary | ICD-10-CM

## 2019-02-10 DIAGNOSIS — G2111 Neuroleptic induced parkinsonism: Secondary | ICD-10-CM | POA: Diagnosis not present

## 2019-02-10 MED ORDER — SERTRALINE HCL 100 MG PO TABS: 100.0000 mg | ORAL_TABLET | Freq: Every day | ORAL | 0 refills | Status: DC

## 2019-02-10 MED ORDER — OLANZAPINE 15 MG PO TABS: 15.0000 mg | ORAL_TABLET | Freq: Every day | ORAL | 0 refills | Status: DC

## 2019-02-10 MED ORDER — TRAZODONE HCL 100 MG PO TABS: 150.0000 mg | ORAL_TABLET | Freq: Every day | ORAL | 0 refills | Status: DC

## 2019-02-10 MED ORDER — BENZTROPINE MESYLATE 1 MG PO TABS: 1.0000 mg | ORAL_TABLET | Freq: Three times a day (TID) | ORAL | 0 refills | Status: DC

## 2019-02-10 MED ORDER — LITHIUM CARBONATE ER 300 MG PO TBCR: EXTENDED_RELEASE_TABLET | ORAL | 0 refills | Status: DC

## 2019-02-10 NOTE — Progress Notes (Signed)
Virtual Visit via Video Note  I connected with Vanessa Baker on 02/10/19 at  4:00 PM EST by a video enabled telemedicine application and verified that I am speaking with the correct person using two identifiers.   I discussed the limitations of evaluation and management by telemedicine and the availability of in person appointments. The patient expressed understanding and agreed to proceed.     I discussed the assessment and treatment plan with the patient. The patient was provided an opportunity to ask questions and all were answered. The patient agreed with the plan and demonstrated an understanding of the instructions.   The patient was advised to call back or seek an in-person evaluation if the symptoms worsen or if the condition fails to improve as anticipated.   [facility] MD OP Progress Note  02/10/2019 5:21 PM Vanessa Baker  MRN:  [id number]  Chief Complaint:  Chief Complaint    Follow-up     HPI: Vanessa Baker is a 44 year old Caucasian female, divorced, lives in [place], has a history of bipolar disorder, PTSD, neuroleptic induced parkinsonism, cocaine use disorder in remission, tobacco use disorder, cannabis use disorder, hyperlipidemia, seizure disorder, IBS was evaluated by telemedicine today.  Patient today reports she is currently struggling with upper respiratory infection.  She reports she is currently self-medicating herself by using over-the-counter medication.  She had COVID-19 testing done the beginning of December.  Writer reviewed her chart and she had it done on December 11 which came back negative.  She reports she got in touch with her primary care provider who wants her to do the testing again.  Patient reports her mood is currently kind of low.  She however continues to take the medications as prescribed.  Patient denies any suicidality, homicidality or perceptual disturbances.  Patient reports sleep is restless due to her upper respiratory infection as  well as congestion.  Discussed with patient that writer will not make any medication changes today since she is struggling with respiratory symptoms.  Also discussed with her to stop the lithium if she has a lot of diarrhea or vomiting and to stay well-hydrated.   Visit Diagnosis:    ICD-10-CM   1. Bipolar I disorder, most recent episode depressed ([place])  F31.30 traZODone (DESYREL) 100 MG tablet   improving  2. PTSD (post-traumatic stress disorder)  F43.10 lithium carbonate (LITHOBID) 300 MG CR tablet    OLANZapine (ZYPREXA) 15 MG tablet    sertraline (ZOLOFT) 100 MG tablet   improving  3. Neuroleptic induced Parkinsonism (HCC)  G21.11 benztropine (COGENTIN) 1 MG tablet  4. Restless leg syndrome  G25.81   5. Cocaine use disorder, moderate, in sustained remission (HCC)  F14.21   6. Alcohol use disorder, mild, abuse  F10.10   7. Cannabis use disorder, mild, in early remission  F12.11   8. Tobacco use disorder  F17.200     Past Psychiatric History: I have reviewed past psychiatric history from my progress note on 04/02/2017.  Past trials of Seroquel, Latuda, Ambien, clonazepam, Topamax, gabapentin, Trintellix, Abilify, Rexulti, Artane.  Past Medical History:  Past Medical History:  Diagnosis Date  . Allergy   . Anxiety   . Asthma   . Bipolar disorder ([place])   . Bursitis   . Depression   . Essential hypertension 03/28/2017  . Frequent headaches   . GERD (gastroesophageal reflux disease)   . Headache   . High cholesterol   . Hypertension   . Irritable bowel syndrome (IBS)   . Irritable  colon 02/23/2010  . Migraine 04/02/2017  . Obesity (BMI 30-39.9) 11/25/2013  . Seizure ([place]) [phone]  . Seizures ([place])   . Tremor     Past Surgical History:  Procedure Laterality Date  . NO PAST SURGERIES      Family Psychiatric History: I have reviewed family psychiatric history from my progress note on 04/02/2017.  Family History:  Family History  Problem Relation Age of Onset  . Bipolar  disorder Mother   . Schizophrenia Mother   . Hypertension Mother   . Diabetes Mother   . Cancer Mother   . Anxiety disorder Mother   . ADD / ADHD Mother   . Alcohol abuse Mother   . Drug abuse Mother   . Bipolar disorder Sister   . Anxiety disorder Sister   . Drug abuse Sister   . Bipolar disorder Brother   . Anxiety disorder Brother   . Drug abuse Brother   . Hypertension Father   . Cancer Father   . Breast cancer Maternal Aunt   . Breast cancer Paternal Aunt   . Breast cancer Maternal Grandmother   . Alcohol abuse Maternal Grandmother   . Breast cancer Paternal Grandmother   . Parkinson's disease Maternal Grandfather   . Alcohol abuse Maternal Grandfather   . Alcohol abuse Maternal Uncle   . ADD / ADHD Son   . Tremor Neg Hx     Social History: Reviewed social history from my progress note on 04/02/2017 Social History   Socioeconomic History  . Marital status: Married    Spouse name: dewey   . Number of children: 1  . Years of education: 50  . Highest education level: Associate degree: occupational, Hotel manager, or vocational program  Occupational History  . Occupation: disability  Tobacco Use  . Smoking status: Current Every Day Smoker    Packs/day: 1.00    Years: 27.00    Pack years: 27.00    Types: Cigarettes  . Smokeless tobacco: Never Used  Substance and Sexual Activity  . Alcohol use: Not Currently  . Drug use: No  . Sexual activity: Yes    Partners: Male    Birth control/protection: None  Other Topics Concern  . Not on file  Social History Narrative   Lives with husband, [name] (on [place])   Right-handed   Caffeine use: 1 cup coffee a day, 2 soft drinks per day   Social Determinants of Health   Financial Resource Strain:   . Difficulty of Paying Living Expenses: Not on file  Food Insecurity:   . Worried About Charity fundraiser in the Last Year: Not on file  . Ran Out of Food in the Last Year: Not on file  Transportation Needs:   .  Lack of Transportation (Medical): Not on file  . Lack of Transportation (Non-Medical): Not on file  Physical Activity: Insufficiently Active  . Days of Exercise per Week: 3 days  . Minutes of Exercise per Session: 30 min  Stress:   . Feeling of Stress : Not on file  Social Connections:   . Frequency of Communication with Friends and Family: Not on file  . Frequency of Social Gatherings with Friends and Family: Not on file  . Attends Religious Services: Not on file  . Active Member of Clubs or Organizations: Not on file  . Attends Archivist Meetings: Not on file  . Marital Status: Not on file    Allergies:  Allergies  Allergen Reactions  .  Amitriptyline Other (See Comments)    Confusion, paralysis    Metabolic Disorder Labs: Lab Results  Component Value Date   HGBA1C 5.3 12/19/2018   MPG 105 12/19/2018   MPG 128 11/19/2017   Lab Results  Component Value Date   PROLACTIN 4.6 (L) 04/22/2017   PROLACTIN 10.2 11/10/2015   Lab Results  Component Value Date   CHOL 112 12/19/2018   TRIG 172 (H) 12/19/2018   HDL 33 (L) 12/19/2018   CHOLHDL 3.4 12/19/2018   LDLCALC 54 12/19/2018   [place]  03/05/2018     Comment:     . LDL cholesterol not calculated. Triglyceride levels greater than 400 mg/dL invalidate calculated LDL results. . Reference range: <100 . Desirable range <100 mg/dL for primary prevention;   <70 mg/dL for patients with CHD or diabetic patients  with > or = 2 CHD risk factors. Marland Kitchen LDL-C is now calculated using the Martin-Hopkins  calculation, which is a validated novel method providing  better accuracy than the Friedewald equation in the  estimation of LDL-C.  Cresenciano Genre et al. Annamaria Helling. 1610;960(45): 2061-2068  (http://education.[employer].com/faq/FAQ164)    Lab Results  Component Value Date   TSH 2.928 11/14/2018   TSH 2.08 11/19/2017    Therapeutic Level Labs: Lab Results  Component Value Date   LITHIUM 0.32 (L) 11/14/2018    LITHIUM 0.33 (L) 06/19/2018   Lab Results  Component Value Date   VALPROATE 61.4 08/13/2017   VALPROATE <10 (L) 04/22/2017   No components found for:  CBMZ  Current Medications: Current Outpatient Medications  Medication Sig Dispense Refill  . ACCU-CHEK AVIVA PLUS test strip USE 1 STRIP TO CHECK GLUCOSE UP TO 3 TIMES DAILY AS DIRECTED. 300 each 1  . Accu-Chek Softclix Lancets lancets Use to check blood sugar up to 3 times daily as instructed 300 each 1  . acetaminophen (TYLENOL) 500 MG tablet Take 500 mg by mouth every 6 (six) hours as needed.    Marland Kitchen albuterol (PROAIR HFA) 108 (90 Base) MCG/ACT inhaler Inhale 2 puffs into the lungs every 4 (four) hours as needed for wheezing or shortness of breath. 8 g 5  . atorvastatin (LIPITOR) 40 MG tablet Take 1 tablet (40 mg total) by mouth daily. 90 tablet 1  . baclofen (LIORESAL) 10 MG tablet Take 0.5-1 tablets (5-10 mg total) by mouth 3 (three) times daily as needed for muscle spasms. 90 each 2  . benztropine (COGENTIN) 1 MG tablet Take 1 tablet (1 mg total) by mouth 3 (three) times daily. 270 tablet 0  . blood glucose meter kit and supplies Dispense based on patient and insurance preference. Use up to four times daily as directed. (FOR ICD-10 E10.9, E11.9). 1 each 0  . Blood Glucose Monitoring Suppl (ACCU-CHEK AVIVA PLUS) w/Device KIT Use to check blood sugar up to 3 times daily 1 kit 0  . Calcium Carb-Cholecalciferol (CALCIUM 600+D3 PO) Take 1 tablet by mouth daily.    . Dulaglutide (TRULICITY) 1.5 WU/9.8JX SOPN Inject 1.5 mg into the skin every 7 (seven) days. 4 pen 12  . esomeprazole (NEXIUM) 20 MG capsule Take 1 capsule (20 mg total) by mouth daily at 12 noon. 90 capsule 3  . fluticasone (FLONASE) 50 MCG/ACT nasal spray Place 2 sprays into both nostrils daily. Use for 4-6 weeks then stop and use seasonally or as needed. 16 g 3  . Fluticasone-Umeclidin-Vilant (TRELEGY ELLIPTA) 100-62.5-25 MCG/INH AEPB Inhale 1 puff into the lungs daily. 30 each 5   . gabapentin (NEURONTIN)  300 MG capsule Take 1 capsule (300 mg total) by mouth 2 (two) times daily. For restless legs 180 capsule 1  . ibuprofen (ADVIL,MOTRIN) 200 MG tablet Take 400-800 mg by mouth every 6 (six) hours as needed (for pain or headaches).    . linaclotide (LINZESS) 145 MCG CAPS capsule Take 1 capsule (145 mcg total) by mouth daily before breakfast. 30 capsule 2  . lisinopril (ZESTRIL) 10 MG tablet Take 1 tablet (10 mg total) by mouth daily. 90 tablet 0  . lithium carbonate (LITHOBID) 300 MG CR tablet TAKE 1 TABLET BY MOUTH TWICE DAILY WITH A MEAL 180 tablet 0  . Multiple Vitamins-Minerals (MULTIVITAMIN ADULT EXTRA C) CHEW Chew by mouth.    . norethindrone-ethinyl estradiol (JUNEL FE 1/20) 1-20 MG-MCG tablet Take 1 tablet by mouth daily. 84 tablet 0  . OLANZapine (ZYPREXA) 15 MG tablet Take 1 tablet (15 mg total) by mouth at bedtime. 90 tablet 0  . omega-3 acid ethyl esters (LOVAZA) 1 g capsule Take 1 capsule by mouth twice daily 180 capsule 0  . primidone (MYSOLINE) 50 MG tablet Take 1 tablet (50 mg total) by mouth at bedtime. 90 tablet 1  . sertraline (ZOLOFT) 100 MG tablet Take 1 tablet (100 mg total) by mouth daily. 90 tablet 0  . traZODone (DESYREL) 100 MG tablet Take 1.5 tablets (150 mg total) by mouth at bedtime. For sleep 135 tablet 0   No current facility-administered medications for this visit.     Musculoskeletal: Strength & Muscle Tone: UTA Gait & Station: normal Patient leans: N/A  Psychiatric Specialty Exam: Review of Systems  Constitutional: Positive for appetite change, fatigue and fever.  HENT: Positive for congestion.   Gastrointestinal: Positive for diarrhea.  Psychiatric/Behavioral: Positive for dysphoric mood and sleep disturbance.  All other systems reviewed and are negative.   There were no vitals taken for this visit.There is no height or weight on file to calculate BMI.  General Appearance: Casual  Eye Contact:  Fair  Speech:  Clear and  Coherent  Volume:  Normal  Mood:  Dysphoric  Affect:  Congruent  Thought Process:  Goal Directed and Descriptions of Associations: Intact  Orientation:  Full (Time, Place, and Person)  Thought Content: Logical   Suicidal Thoughts:  No  Homicidal Thoughts:  No  Memory:  Immediate;   Fair Recent;   Fair Remote;   Fair  Judgement:  Fair  Insight:  Fair  Psychomotor Activity:  Normal  Concentration:  Concentration: Good and Attention Span: Fair  Recall:  [employer] of Knowledge: Fair  Language: Fair  Akathisia:  No  Handed:  Right  AIMS (if indicated): UTA  Assets:  Communication Skills Desire for Improvement Social Support  ADL's:  Intact  Cognition: WNL  Sleep:  Restless due to URI   Screenings: AIMS     Office Visit from 04/07/2018 in [facility] Office Visit from 01/01/2018 in [place] Total Score  10  1    GAD-7     Office Visit from 12/16/2018 in [facility] Office Visit from 09/06/2017 in [facility]  Total GAD-7 Score  8  8    PHQ2-9     Office Visit from 01/09/2019 in [facility] Office Visit from 12/30/2018 in [facility] Office Visit from 12/16/2018 in [facility] Office Visit from 07/25/2018 in [facility] Office Visit from 06/09/2018 in [facility]  PHQ-2 Total Score  0  0  3  2  0  PHQ-9 Total Score  0  '2  13  8  '$ --       Assessment and Plan: Vanessa Baker is a 44 year old Caucasian female who has a history of bipolar disorder, PTSD, panic disorder, drug induced parkinsonism, seizure disorder, diabetes melitis, polysubstance abuse was evaluated by telemedicine today.  She is biologically predisposed given her history of trauma as well as multiple medical problems, mental health problems in her family and past history of substance abuse.  Patient currently has psychosocial stressors of her own health  issues and her husband's health problems.  Patient currently does report low mood however will not make any medication changes since she is currently struggling with significant upper respiratory tract infection as well as is taking multiple medications over-the-counter which could also contribute to her sluggishness.  Discussed with patient to reach out to her primary care provider to follow-up for her upper respiratory infection.  Plan as noted below.  Plan Bipolar disorder-stable Lithium 300 mg p.o. twice daily Lithium level on 11/14/2018-0.32-subtherapeutic.  Will not make any medication changes today as summarized above. Reviewed TSH-dated 11/14/2018-within normal limits Zoloft 100 mg p.o. daily Zyprexa 15 mg p.o. nightly  Panic attacks-stable Zoloft 100 mg p.o. daily  PTSD-stable Zoloft 100 mg p.o. daily  Insomnia-stable Trazodone 100 mg p.o. nightly She will continue to work on sleep hygiene techniques.  Her upper respiratory infection could also be contributing to her restlessness at night.  Drug-induced Parkinson's disease-stable Cogentin 1 mg p.o. 3 times daily  Call use disorder/cocaine and cannabis use disorder-in remission Continue to stay sober.  Tobacco use disorder-stable She is not ready to quit.  Provided smoking cessation counseling.  Follow-up in clinic in 4 to 5 weeks or sooner if needed.  Discussed with patient to reach out to her primary care provider . Feb 8 , 2 pm  I have spent atleast 20 minutes non face to face with patient today. More than 50 % of the time was spent for preparing to see the patient ( e.g., review of test, records ), ordering medications and test ,psychoeducation and supportive psychotherapy and care coordination,as well as documenting clinical information in electronic health record.  This note was generated in part or whole with voice recognition software. Voice recognition is usually quite accurate but there are transcription errors that  can and very often do occur. I apologize for any typographical errors that were not detected and corrected.      Ursula Alert, MD 02/10/2019, 5:21 PM

## 2019-02-24 DIAGNOSIS — K582 Mixed irritable bowel syndrome: Secondary | ICD-10-CM

## 2019-03-02 ENCOUNTER — Ambulatory Visit: Payer: Medicare HMO | Attending: Internal Medicine

## 2019-03-02 DIAGNOSIS — Z20822 Contact with and (suspected) exposure to covid-19: Secondary | ICD-10-CM

## 2019-03-03 ENCOUNTER — Ambulatory Visit: Payer: Medicare HMO | Admitting: Gastroenterology

## 2019-03-03 DIAGNOSIS — R251 Tremor, unspecified: Secondary | ICD-10-CM

## 2019-03-03 LAB — NOVEL CORONAVIRUS, NAA: SARS-CoV-2, NAA: NOT DETECTED

## 2019-03-04 ENCOUNTER — Other Ambulatory Visit: Payer: Self-pay | Admitting: Family Medicine

## 2019-03-04 DIAGNOSIS — E781 Pure hyperglyceridemia: Secondary | ICD-10-CM

## 2019-03-05 ENCOUNTER — Telehealth: Payer: Self-pay

## 2019-03-10 ENCOUNTER — Telehealth: Payer: Self-pay

## 2019-03-11 ENCOUNTER — Ambulatory Visit (INDEPENDENT_AMBULATORY_CARE_PROVIDER_SITE_OTHER): Payer: Medicare HMO | Admitting: Licensed Clinical Social Worker

## 2019-03-11 ENCOUNTER — Ambulatory Visit: Payer: Self-pay | Admitting: Licensed Clinical Social Worker

## 2019-03-11 DIAGNOSIS — F329 Major depressive disorder, single episode, unspecified: Secondary | ICD-10-CM

## 2019-03-11 DIAGNOSIS — E1169 Type 2 diabetes mellitus with other specified complication: Secondary | ICD-10-CM | POA: Diagnosis not present

## 2019-03-11 DIAGNOSIS — G8929 Other chronic pain: Secondary | ICD-10-CM

## 2019-03-11 DIAGNOSIS — R69 Illness, unspecified: Secondary | ICD-10-CM | POA: Diagnosis not present

## 2019-03-11 NOTE — Chronic Care Management (AMB) (Signed)
  Care Management   Follow Up Note   03/11/2019 Name: Rayvin Abid MRN: [id number] DOB: 1975-05-18  Referred by: Mikey College, NP (Inactive) Reason for referral : Tonasket Feltman is a 44 y.o. year old female who is a primary care patient of Mikey College, NP (Inactive). The care management team was consulted for assistance with care management and care coordination needs.    Review of patient status, including review of consultants reports, relevant laboratory and other test results, and collaboration with appropriate care team members and the patient's provider was performed as part of comprehensive patient evaluation and provision of chronic care management services.    LCSW completed CCM outreach attempt today but was unable to reach patient successfully. A HIPPA compliant voice message was left encouraging patient to return call once available. LCSW rescheduled CCM SW appointment as well.  A HIPPA compliant phone message was left for the patient providing contact information and requesting a return call.   Eula Fried, BSW, MSW, [email or web address] Phone: [phone]
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
## 2019-03-16 ENCOUNTER — Ambulatory Visit (INDEPENDENT_AMBULATORY_CARE_PROVIDER_SITE_OTHER): Payer: Medicare HMO | Admitting: Family Medicine

## 2019-03-16 ENCOUNTER — Ambulatory Visit (INDEPENDENT_AMBULATORY_CARE_PROVIDER_SITE_OTHER): Payer: Medicare HMO | Admitting: Psychiatry

## 2019-03-16 ENCOUNTER — Encounter: Payer: Self-pay | Admitting: Family Medicine

## 2019-03-16 ENCOUNTER — Other Ambulatory Visit: Payer: Self-pay

## 2019-03-16 ENCOUNTER — Encounter: Payer: Self-pay | Admitting: Psychiatry

## 2019-03-16 VITALS — BP 131/67 | HR 95 | Temp 97.1°F | Resp 16 | Ht 62.0 in | Wt 184.0 lb

## 2019-03-16 DIAGNOSIS — F3175 Bipolar disorder, in partial remission, most recent episode depressed: Secondary | ICD-10-CM | POA: Diagnosis not present

## 2019-03-16 DIAGNOSIS — G8929 Other chronic pain: Secondary | ICD-10-CM | POA: Diagnosis not present

## 2019-03-16 DIAGNOSIS — R69 Illness, unspecified: Secondary | ICD-10-CM | POA: Diagnosis not present

## 2019-03-16 DIAGNOSIS — M160 Bilateral primary osteoarthritis of hip: Secondary | ICD-10-CM | POA: Diagnosis not present

## 2019-03-16 DIAGNOSIS — F1411 Cocaine abuse, in remission: Secondary | ICD-10-CM | POA: Insufficient documentation

## 2019-03-16 DIAGNOSIS — F4310 Post-traumatic stress disorder, unspecified: Secondary | ICD-10-CM | POA: Diagnosis not present

## 2019-03-16 DIAGNOSIS — G2111 Neuroleptic induced parkinsonism: Secondary | ICD-10-CM

## 2019-03-16 DIAGNOSIS — G2581 Restless legs syndrome: Secondary | ICD-10-CM | POA: Diagnosis not present

## 2019-03-16 DIAGNOSIS — F1010 Alcohol abuse, uncomplicated: Secondary | ICD-10-CM

## 2019-03-16 DIAGNOSIS — M25552 Pain in left hip: Secondary | ICD-10-CM | POA: Diagnosis not present

## 2019-03-16 DIAGNOSIS — M25551 Pain in right hip: Secondary | ICD-10-CM

## 2019-03-16 DIAGNOSIS — F1211 Cannabis abuse, in remission: Secondary | ICD-10-CM

## 2019-03-16 DIAGNOSIS — F17200 Nicotine dependence, unspecified, uncomplicated: Secondary | ICD-10-CM

## 2019-03-16 DIAGNOSIS — F1421 Cocaine dependence, in remission: Secondary | ICD-10-CM

## 2019-03-16 MED ORDER — LIDOCAINE HCL (PF) 1 % IJ SOLN
4.0000 mL | Freq: Once | INTRAMUSCULAR | Status: AC
  Administered 2019-03-16: 4 mL

## 2019-03-16 MED ORDER — METHYLPREDNISOLONE ACETATE 40 MG/ML IJ SUSP
40.0000 mg | Freq: Once | INTRAMUSCULAR | Status: AC
  Administered 2019-03-16: 40 mg via INTRA_ARTICULAR

## 2019-03-16 NOTE — Progress Notes (Signed)
Provider Location : [facility] Patient Location : Home  Virtual Visit via Video Note  I connected with Vanessa Baker on 03/16/19 at  2:00 PM EST by a video enabled telemedicine application and verified that I am speaking with the correct person using two identifiers.   I discussed the limitations of evaluation and management by telemedicine and the availability of in person appointments. The patient expressed understanding and agreed to proceed.  I discussed the assessment and treatment plan with the patient. The patient was provided an opportunity to ask questions and all were answered. The patient agreed with the plan and demonstrated an understanding of the instructions.   The patient was advised to call back or seek an in-person evaluation if the symptoms worsen or if the condition fails to improve as anticipated.   [facility] MD OP Progress Note  03/16/2019 2:31 PM Vanessa Baker  MRN:  [id number]  Chief Complaint:  Chief Complaint    Follow-up     HPI: Vanessa Baker is a 43 year old Caucasian female, divorced, lives in [place], has a history of bipolar disorder, PTSD, neuroleptic induced parkinsonism cocaine use disorder in remission, tobacco use disorder, cannabis use disorder, hyperlipidemia, seizure disorder, IBS was evaluated by telemedicine today.  A video call was initiated however due to connection problem it had to be changed to a phone call.  Patient today reports she is currently struggling with diarrhea.  She reports this has been going on since the past 1 month or more.  She has upcoming appointment with gastroenterology for evaluation.  She continues to be compliant on medications and reports her mood symptoms are stable.  She denies any suicidality, homicidality or perceptual disturbances.  She reports sleep continues to be going well.  She continues to smoke cigarettes and is not ready to quit.  Patient denies any other concerns at this time. Visit Diagnosis:     ICD-10-CM   1. Bipolar disorder, in partial remission, most recent episode depressed ([place])  F31.75   2. PTSD (post-traumatic stress disorder)  F43.10   3. Neuroleptic induced Parkinsonism ([place])  G21.11   4. Restless leg syndrome  G25.81   5. Cocaine use disorder, moderate, in sustained remission (HCC)  F14.21   6. Alcohol use disorder, mild, abuse  F10.10   7. Cannabis use disorder, mild, in early remission  F12.11   8. Tobacco use disorder  F17.200     Past Psychiatric History: I have reviewed past psychiatric history from my progress note on 04/02/2017.  Past trials of Seroquel, Latuda, Ambien, clonazepam, Topamax, gabapentin, Trintellix, Abilify, Rexulti, Artane.  Past Medical History:  Past Medical History:  Diagnosis Date  . Allergy   . Anxiety   . Asthma   . Bipolar disorder ([place])   . Bursitis   . Depression   . Essential hypertension 03/28/2017  . Frequent headaches   . GERD (gastroesophageal reflux disease)   . Headache   . High cholesterol   . Hypertension   . Irritable bowel syndrome (IBS)   . Irritable colon 02/23/2010  . Migraine 04/02/2017  . Obesity (BMI 30-39.9) 11/25/2013  . Seizure ([place]) [phone]  . Seizures ([place])   . Tremor     Past Surgical History:  Procedure Laterality Date  . NO PAST SURGERIES      Family Psychiatric History: Reviewed family psychiatric history from my progress note on 04/02/2017.  Family History:  Family History  Problem Relation Age of Onset  . Bipolar disorder Mother   . Schizophrenia  Mother   . Hypertension Mother   . Diabetes Mother   . Cancer Mother   . Anxiety disorder Mother   . ADD / ADHD Mother   . Alcohol abuse Mother   . Drug abuse Mother   . Bipolar disorder Sister   . Anxiety disorder Sister   . Drug abuse Sister   . Bipolar disorder Brother   . Anxiety disorder Brother   . Drug abuse Brother   . Hypertension Father   . Cancer Father   . Breast cancer Maternal Aunt   . Breast cancer Paternal Aunt   .  Breast cancer Maternal Grandmother   . Alcohol abuse Maternal Grandmother   . Breast cancer Paternal Grandmother   . Parkinson's disease Maternal Grandfather   . Alcohol abuse Maternal Grandfather   . Alcohol abuse Maternal Uncle   . ADD / ADHD Son   . Tremor Neg Hx     Social History: I have reviewed social history from my progress note on 04/02/2017. Social History   Socioeconomic History  . Marital status: Married    Spouse name: dewey   . Number of children: 1  . Years of education: 64  . Highest education level: Associate degree: occupational, Hotel manager, or vocational program  Occupational History  . Occupation: disability  Tobacco Use  . Smoking status: Current Every Day Smoker    Packs/day: 1.00    Years: 27.00    Pack years: 27.00    Types: Cigarettes  . Smokeless tobacco: Never Used  Substance and Sexual Activity  . Alcohol use: Not Currently  . Drug use: No  . Sexual activity: Yes    Partners: Male    Birth control/protection: None  Other Topics Concern  . Not on file  Social History Narrative   Lives with husband, [name] (on [place])   Right-handed   Caffeine use: 1 cup coffee a day, 2 soft drinks per day   Social Determinants of Health   Financial Resource Strain:   . Difficulty of Paying Living Expenses: Not on file  Food Insecurity:   . Worried About Charity fundraiser in the Last Year: Not on file  . Ran Out of Food in the Last Year: Not on file  Transportation Needs:   . Lack of Transportation (Medical): Not on file  . Lack of Transportation (Non-Medical): Not on file  Physical Activity: Insufficiently Active  . Days of Exercise per Week: 3 days  . Minutes of Exercise per Session: 30 min  Stress:   . Feeling of Stress : Not on file  Social Connections:   . Frequency of Communication with Friends and Family: Not on file  . Frequency of Social Gatherings with Friends and Family: Not on file  . Attends Religious Services: Not on file  .  Active Member of Clubs or Organizations: Not on file  . Attends Archivist Meetings: Not on file  . Marital Status: Not on file    Allergies:  Allergies  Allergen Reactions  . Amitriptyline Other (See Comments)    Confusion, paralysis    Metabolic Disorder Labs: Lab Results  Component Value Date   HGBA1C 5.3 12/19/2018   MPG 105 12/19/2018   MPG 128 11/19/2017   Lab Results  Component Value Date   PROLACTIN 4.6 (L) 04/22/2017   PROLACTIN 10.2 11/10/2015   Lab Results  Component Value Date   CHOL 112 12/19/2018   TRIG 172 (H) 12/19/2018   HDL 33 (L)  12/19/2018   CHOLHDL 3.4 12/19/2018   LDLCALC 54 12/19/2018   [place]  03/05/2018     Comment:     . LDL cholesterol not calculated. Triglyceride levels greater than 400 mg/dL invalidate calculated LDL results. . Reference range: <100 . Desirable range <100 mg/dL for primary prevention;   <70 mg/dL for patients with CHD or diabetic patients  with > or = 2 CHD risk factors. Marland Kitchen LDL-C is now calculated using the Martin-Hopkins  calculation, which is a validated novel method providing  better accuracy than the Friedewald equation in the  estimation of LDL-C.  Cresenciano Genre et al. Annamaria Helling. 7564;332(95): 2061-2068  (http://education.[facility].com/faq/FAQ164)    Lab Results  Component Value Date   TSH 2.928 11/14/2018   TSH 2.08 11/19/2017    Therapeutic Level Labs: Lab Results  Component Value Date   LITHIUM 0.32 (L) 11/14/2018   LITHIUM 0.33 (L) 06/19/2018   Lab Results  Component Value Date   VALPROATE 61.4 08/13/2017   VALPROATE <10 (L) 04/22/2017   No components found for:  CBMZ  Current Medications: Current Outpatient Medications  Medication Sig Dispense Refill  . ACCU-CHEK AVIVA PLUS test strip USE 1 STRIP TO CHECK GLUCOSE UP TO 3 TIMES DAILY AS DIRECTED. 300 each 1  . Accu-Chek Softclix Lancets lancets Use to check blood sugar up to 3 times daily as instructed 300 each 1  . acetaminophen  (TYLENOL) 500 MG tablet Take 500 mg by mouth every 6 (six) hours as needed.    Marland Kitchen albuterol (PROAIR HFA) 108 (90 Base) MCG/ACT inhaler Inhale 2 puffs into the lungs every 4 (four) hours as needed for wheezing or shortness of breath. 8 g 5  . atorvastatin (LIPITOR) 40 MG tablet Take 1 tablet (40 mg total) by mouth daily. 90 tablet 1  . baclofen (LIORESAL) 10 MG tablet Take 0.5-1 tablets (5-10 mg total) by mouth 3 (three) times daily as needed for muscle spasms. 90 each 2  . benztropine (COGENTIN) 1 MG tablet Take 1 tablet (1 mg total) by mouth 3 (three) times daily. 270 tablet 0  . blood glucose meter kit and supplies Dispense based on patient and insurance preference. Use up to four times daily as directed. (FOR ICD-10 E10.9, E11.9). 1 each 0  . Blood Glucose Monitoring Suppl (ACCU-CHEK AVIVA PLUS) w/Device KIT Use to check blood sugar up to 3 times daily 1 kit 0  . Calcium Carb-Cholecalciferol (CALCIUM 600+D3 PO) Take 1 tablet by mouth daily.    . Dulaglutide (TRULICITY) 1.5 JO/8.4ZY SOPN Inject 1.5 mg into the skin every 7 (seven) days. 4 pen 12  . esomeprazole (NEXIUM) 20 MG capsule Take 1 capsule (20 mg total) by mouth daily at 12 noon. 90 capsule 3  . fluticasone (FLONASE) 50 MCG/ACT nasal spray Place 2 sprays into both nostrils daily. Use for 4-6 weeks then stop and use seasonally or as needed. 16 g 3  . Fluticasone-Umeclidin-Vilant (TRELEGY ELLIPTA) 100-62.5-25 MCG/INH AEPB Inhale 1 puff into the lungs daily. 30 each 5  . gabapentin (NEURONTIN) 300 MG capsule Take 1 capsule (300 mg total) by mouth 2 (two) times daily. For restless legs 180 capsule 1  . ibuprofen (ADVIL,MOTRIN) 200 MG tablet Take 400-800 mg by mouth every 6 (six) hours as needed (for pain or headaches).    . linaclotide (LINZESS) 145 MCG CAPS capsule Take 1 capsule (145 mcg total) by mouth daily before breakfast. 30 capsule 2  . lisinopril (ZESTRIL) 10 MG tablet Take 1 tablet (10 mg total)  by mouth daily. 90 tablet 0  . Multiple  Vitamins-Minerals (MULTIVITAMIN ADULT EXTRA C) CHEW Chew by mouth.    . norethindrone-ethinyl estradiol (JUNEL FE 1/20) 1-20 MG-MCG tablet Take 1 tablet by mouth daily. 84 tablet 0  . OLANZapine (ZYPREXA) 15 MG tablet Take 1 tablet (15 mg total) by mouth at bedtime. 90 tablet 0  . omega-3 acid ethyl esters (LOVAZA) 1 g capsule Take 1 capsule by mouth twice daily 180 capsule 1  . primidone (MYSOLINE) 50 MG tablet Take 1 tablet (50 mg total) by mouth at bedtime. 90 tablet 1  . sertraline (ZOLOFT) 100 MG tablet Take 1 tablet (100 mg total) by mouth daily. 90 tablet 0  . traZODone (DESYREL) 100 MG tablet Take 1.5 tablets (150 mg total) by mouth at bedtime. For sleep 135 tablet 0   No current facility-administered medications for this visit.     Musculoskeletal: Strength & Muscle Tone: UTA Gait & Station: UTA Patient leans: N/A  Psychiatric Specialty Exam: Review of Systems  Gastrointestinal: Positive for diarrhea.  Psychiatric/Behavioral: Negative for agitation, behavioral problems, confusion, decreased concentration, dysphoric mood, hallucinations, self-injury, sleep disturbance and suicidal ideas. The patient is not nervous/anxious and is not hyperactive.   All other systems reviewed and are negative.   There were no vitals taken for this visit.There is no height or weight on file to calculate BMI.  General Appearance: UTA  Eye Contact:  UTA  Speech:  Clear and Coherent  Volume:  Normal  Mood:  Euthymic  Affect:  UTA  Thought Process:  Goal Directed and Descriptions of Associations: Intact  Orientation:  Full (Time, Place, and Person)  Thought Content: Logical   Suicidal Thoughts:  No  Homicidal Thoughts:  No  Memory:  Immediate;   Fair Recent;   Fair Remote;   Fair  Judgement:  Fair  Insight:  Fair  Psychomotor Activity:  UTA  Concentration:  Concentration: Fair and Attention Span: Fair  Recall:  [employer] of Knowledge: Fair  Language: Fair  Akathisia:  No  Handed:   Right  AIMS (if indicated): uta  Assets:  Communication Skills Desire for Improvement Housing Social Support  ADL's:  Intact  Cognition: WNL  Sleep:  Fair   Screenings: AIMS     Office Visit from 04/07/2018 in [facility] Office Visit from 01/01/2018 in [facility] Total Score  10  1    GAD-7     Office Visit from 12/16/2018 in [facility]: [facility] Office Visit from 09/06/2017 in [facility]  Total GAD-7 Score  8  8    PHQ2-9     Office Visit from 01/09/2019 in [facility] Office Visit from 12/30/2018 in [facility] Office Visit from 12/16/2018 in [facility] Office Visit from 07/25/2018 in [facility] Office Visit from 06/09/2018 in [facility]  PHQ-2 Total Score  0  0  3  2  0  PHQ-9 Total Score  0  '2  13  8  '$ --       Assessment and Plan: Sabreena is a 44 year old Caucasian female who has a history of bipolar disorder, PTSD, panic disorder, drug induced parkinsonism, seizure disorder, diabetes melitis, polysubstance abuse was evaluated by telemedicine today.  She is biologically predisposed given her history of trauma as well as multiple medical problems, mental health problems in her family and past history of substance abuse.  Patient  with psychosocial stressors of her own health issues and her husband's health problems continues to struggle with diarrhea.  Discussed discontinuing lithium due to the risk of lithium toxicity.  Plan as noted below.  Plan Bipolar disorder in partial remission Discontinue lithium since patient is currently struggling with diarrhea. Zoloft 100 mg p.o. daily Zyprexa 15 mg p.o. nightly   Panic attacks-stable Zoloft 100 mg p.o. daily  PTSD-stable Zoloft 100 mg p.o. daily  Insomnia-stable Trazodone 100 mg p.o. nightly  Drug-induced Parkinson's disease-stable Cogentin 1 mg p.o. 3 times  daily  Alcohol use disorder/cocaine and cannabis use disorder in remission Continues to stay sober  Tobacco use disorder-unstable She is not ready to quit, provided smoking cessation counseling  Follow-up in clinic in 1 month or sooner if needed.  March 4 at 3 PM  I have spent atleast 20 minutes non face to face with patient today. More than 50 % of the time was spent for ordering medications and test ,psychoeducation and supportive psychotherapy and care coordination,as well as documenting clinical information in electronic health record. This note was generated in part or whole with voice recognition software. Voice recognition is usually quite accurate but there are transcription errors that can and very often do occur. I apologize for any typographical errors that were not detected and corrected.      Ursula Alert, MD 03/16/2019, 2:31 PM

## 2019-03-16 NOTE — Progress Notes (Signed)
Subjective:    Patient ID: Vanessa Baker, female    DOB: 17-Oct-1975, 44 y.o.   MRN: [id number]  Vanessa Baker is a 44 y.o. female presenting on 03/16/2019 for Hip Pain (here for steroid shot)   HPI   Chronic Hip Pain, bilateral Reports chronic history, previously seen by Orthopedic in past with x-rays dx with arthritis and bone spurs, she has been treated with prednisone in past for trochanteric bursitis in past.  Last visit with me for this issue 01/2019 - received x 2 steroid injections bilateral hip trochanteric bursa, with significant improvement. Now some symptoms starting to return with some hip pain L>R - Describes hip pain bilateral, worse if lay on side of hip at night, or if prolonged standing, activity, some radiating, but overall not as severe as previous hip pain but not endorsing nerve pain or tingling numbness, denies any weakness, or trauma or other injury / fall   Depression screen [facility] 2/9 01/09/2019 12/30/2018 12/16/2018  Decreased Interest 0 0 1  Down, Depressed, Hopeless 0 0 2  PHQ - 2 Score 0 0 3  Altered sleeping 0 2 3  Tired, decreased energy 0 0 2  Change in appetite 0 0 1  Feeling bad or failure about yourself  0 0 1  Trouble concentrating 0 0 2  Moving slowly or fidgety/restless 0 0 1  Suicidal thoughts 0 0 0  PHQ-9 Score 0 2 13  Difficult doing work/chores Not difficult at all Not difficult at all Somewhat difficult  Some recent data might be hidden    Social History   Tobacco Use  . Smoking status: Current Every Day Smoker    Packs/day: 1.00    Years: 27.00    Pack years: 27.00    Types: Cigarettes  . Smokeless tobacco: Never Used  Substance Use Topics  . Alcohol use: Not Currently  . Drug use: No    Review of Systems Per HPI unless specifically indicated above     Objective:    BP 131/67   Pulse 95   Temp (!) 97.1 F (36.2 C) (Temporal)   Resp 16   Ht 5\' 2"  (1.575 m)   Wt 184 lb (83.5 kg)   SpO2 100%   BMI 33.65  kg/m   Wt Readings from Last 3 Encounters:  03/16/19 184 lb (83.5 kg)  01/09/19 175 lb (79.4 kg)  12/23/18 173 lb (78.5 kg)    Physical Exam Vitals and nursing note reviewed.  Constitutional:      General: She is not in acute distress.    Appearance: She is well-developed. She is not diaphoretic.     Comments: Well-appearing, comfortable, cooperative  HENT:     Head: Normocephalic and atraumatic.  Eyes:     General:        Right eye: No discharge.        Left eye: No discharge.     Conjunctiva/sclera: Conjunctivae normal.  Cardiovascular:     Rate and Rhythm: Normal rate.  Pulmonary:     Effort: Pulmonary effort is normal.  Musculoskeletal:     Comments: Low Back / bilateral hip and Greater Trochanter Inspection: BACK - Normal appearance, no spinal deformity, symmetrical. HIP - Normal appearance, symmetrical, no obvious leg length or pelvis deformity  Palpation: HIP - Moderate L>R tender to palpation deeper bilateral greater trochanter region of lateral upper thigh. Lower extremity thigh calf soft non tender no spasm.  ROM: HIP - Bilateral hip flex/ext supine normal.  Similar to last time, still reduced L>R internal rotation with stiffness and soreness limiting ROM  Special Testing: HIP - L hip internal rotation provoked pain. R hip without provoked but has some mild symptoms. Acetabular compression of hip L>R is mild positive for pain  Strength: Bilateral hip flex/ext 5/5, knee flex/ext 5/5, ankle dorsiflex/plantarflex 5/5 Neurovascular: intact distal sensation to light touch   Skin:    General: Skin is warm and dry.     Findings: No erythema or rash.  Neurological:     Mental Status: She is alert and oriented to person, place, and time.  Psychiatric:        Behavior: Behavior normal.     Comments: Well groomed, good eye contact, normal speech and thoughts      ________________________________________________________ PROCEDURE NOTE Date: 03/16/19 Bilateral Hip  trochanteric injection Discussed benefits and risks (including pain, bleeding, infection, steroid flare). Verbal consent given by patient. Medication: PER HIP -  1 cc Depo-medrol 40mg  and 4 cc Lidocaine 1% without epi Time Out taken  Trochanteric bursa Patient in lateral decubitus position with affected hip superior. Landmarks identified over RIGHT, then next LEFT greater trochanter. Palpated to identify point of maximum tenderness over bony process. Area cleansed with alcohol wipes. Using 21 gauage and 1, 1/2 inch needle, Initially RIGHT then repeat LEFT trochanteric bursa space was injected (with above listed medication) with needle to bone contact then slightly withdrawn to inject. cold spray used for superficial anesthetic.Sterile bandage placed.Patient tolerated procedure well without bleeding or paresthesias.No complications.   I have personally reviewed the radiology report  CLINICAL DATA: Bilateral hip pain, unresponsive to recent bilateral hip steroid injections. No reported injury.  EXAM: DG HIP (WITH OR WITHOUT PELVIS) 2-3V LEFT  COMPARISON: None.  FINDINGS: There is no evidence of hip fracture or dislocation. There is no evidence of arthropathy or other focal bone abnormality.  IMPRESSION: No acute osseous abnormality. No significant left hip arthropathy.   Electronically Signed By: Ilona Sorrel M.D. On: 04/18/2018 16:51  Results for orders placed or performed in visit on 03/02/19  Novel Coronavirus, NAA ([facility])   Specimen: Nasopharyngeal(NP) swabs in vial transport medium   NASOPHARYNGE  TESTING  Result Value Ref Range   SARS-CoV-2, NAA Not Detected Not Detected      Assessment & Plan:   Problem List Items Addressed This Visit    Chronic hip pain, bilateral - Primary    Other Visit Diagnoses    Primary osteoarthritis of both hips       Relevant Medications   lidocaine (PF) (XYLOCAINE) 1 % injection 4 mL (Completed)   methylPREDNISolone  acetate (DEPO-MEDROL) injection 40 mg (Completed)   lidocaine (PF) (XYLOCAINE) 1 % injection 4 mL (Completed)   methylPREDNISolone acetate (DEPO-MEDROL) injection 40 mg (Completed)   Primary osteoarthritis of hips, bilateral       Relevant Medications   methylPREDNISolone acetate (DEPO-MEDROL) injection 40 mg (Completed)   methylPREDNISolone acetate (DEPO-MEDROL) injection 40 mg (Completed)      Chronic problem with L > R Hip pain, recurrent flare of trochanteric bursitis given history and exam with point tenderness. Suspect underlying mild osteoarthritis as likely cause. S/p bilateral greater trochanteric steroid injections done 01/09/20 with good results - No radiation of pain or radicular symptoms not responding to conservative therapy  Plan: 1. Bilateral trochanteric bursa steroid injections performed today (#2 in series - now 3 months apart), see procedure notes, tolerated well. - May take oral NSAID PRN, Gabapentin, Tylenol, topical relief, ice/heat  Follow-up 3-6 months consider refer to ortho if need, max can do one more round in 3 months then would recommend HOLD for >6 months   Meds ordered this encounter  Medications  . lidocaine (PF) (XYLOCAINE) 1 % injection 4 mL  . methylPREDNISolone acetate (DEPO-MEDROL) injection 40 mg  . lidocaine (PF) (XYLOCAINE) 1 % injection 4 mL  . methylPREDNISolone acetate (DEPO-MEDROL) injection 40 mg      Follow up plan: Return in about 3 months (around 06/13/2019), or if symptoms worsen or fail to improve, for hip injections.   Nobie Putnam, [facility] 03/16/2019, 10:14 AM

## 2019-03-16 NOTE — Patient Instructions (Addendum)
Thank you for coming to the office today.  You received a bilateral hip joint injection - Lidocaine numbing medicine may ease the pain initially for a few hours until it wears off - As discussed, you may experience a "steroid flare" this evening or within 24-48 hours, anytime medicine is injected into an inflamed joint it can cause the pain to get worse temporarily - Everyone responds differently to these injections, it depends on the patient and the severity of the joint problem, it may provide anywhere from days to weeks, to months of relief. Ideal response is >6 months relief - Try to take it easy for next 1-2 days, avoid over activity and strain on joint (limit walking) - Recommend the following:   - For swelling - rest, compression sleeve / ACE wrap, elevation, and ice packs as needed for first few days   - For pain in future may use heating pad or moist heat as needed  If not improved can refer to Orthopedics  3 to 6 months can repeat injection  Please schedule a Follow-up Appointment to: Return in about 3 months (around 06/13/2019), or if symptoms worsen or fail to improve, for hip injections.  If you have any other questions or concerns, please feel free to call the office or send a message through [facility]. You may also schedule an earlier appointment if necessary.  Additionally, you may be receiving a survey about your experience at our office within a few days to 1 week by e-mail or mail. We value your feedback.  Nobie Putnam, DO [place]

## 2019-03-19 ENCOUNTER — Encounter: Payer: Self-pay | Admitting: Gastroenterology

## 2019-03-19 ENCOUNTER — Other Ambulatory Visit: Payer: Self-pay

## 2019-03-19 ENCOUNTER — Other Ambulatory Visit
Admission: RE | Admit: 2019-03-19 | Discharge: 2019-03-19 | Disposition: A | Discharge status: Home / Self Care | Payer: Medicare HMO | Attending: Gastroenterology | Admitting: Gastroenterology

## 2019-03-19 ENCOUNTER — Ambulatory Visit (INDEPENDENT_AMBULATORY_CARE_PROVIDER_SITE_OTHER): Payer: Medicare HMO | Admitting: Gastroenterology

## 2019-03-19 VITALS — BP 143/74 | HR 86 | Temp 97.2°F | Ht 62.0 in | Wt 182.2 lb

## 2019-03-19 DIAGNOSIS — K529 Noninfective gastroenteritis and colitis, unspecified: Secondary | ICD-10-CM | POA: Diagnosis not present

## 2019-03-19 DIAGNOSIS — K52832 Lymphocytic colitis: Secondary | ICD-10-CM | POA: Diagnosis not present

## 2019-03-19 DIAGNOSIS — D126 Benign neoplasm of colon, unspecified: Secondary | ICD-10-CM

## 2019-03-19 DIAGNOSIS — R1013 Epigastric pain: Secondary | ICD-10-CM

## 2019-03-19 LAB — C-REACTIVE PROTEIN: CRP: 1.1 mg/dL — ABNORMAL HIGH (ref <1.0)

## 2019-03-19 LAB — LIPASE, BLOOD: Lipase: 34 U/L (ref 11–51)

## 2019-03-19 MED ORDER — SUPREP BOWEL PREP KIT 17.5-3.13-1.6 GM/177ML PO SOLN: 1.0000 | ORAL | 0 refills | Status: DC

## 2019-03-19 NOTE — Progress Notes (Signed)
Cephas Darby, MD [address]  [place]  [place], [place] [address]  Main: [phone]  Fax: [phone]    Gastroenterology Consultation  Referring Provider:     Nobie Putnam * Primary Care Physician:  Mikey College, NP (Inactive) Primary Gastroenterologist:  Dr. Cephas Darby Reason for Consultation: Chronic diarrhea        HPI:   Vanessa Baker is a 44 y.o. female referred by Dr. Merrilyn Puma, Jerrel Ivory, NP (Inactive)  for consultation & management of irritable bowel syndrome Patient has history of bipolar, PTSD, panic disorder, drug-induced parkinsonism, seizure disorder, history of diabetes, polysubstance abuse, chronic tobacco use Patient reports that for last 4 months she has been experiencing loose watery bowel movements, nonbloody about 5-6 times daily and sometimes at night, she had episodes of fecal incontinence as well.  For the last 1 week she is also experiencing epigastric pain.  She does report abdominal bloating as well.  She was started on Linzess which she is now taking intermittently due to diarrhea.  She has not tried any antidiarrheal medication.  Her labs revealed chronic mild leukocytosis.  Patient was evaluated at [facility] in 02/2012 by Dr. Ahmed Prima, she was given diagnosis of small intestine bacterial overgrowth and IBS.  She was also treated with rifaximin at that time. Patient underwent upper endoscopy and colonoscopy at [facility] in 02/2012 The colon biopsies were suspicious for lymphocytic colitis.  I did not see any treatment for this at that time  She smokes 1 pack of cigarettes per day since age of 35  NSAIDs: Occasional BC powder use, NSAID use for headache  Antiplts/Anticoagulants/Anti thrombotics: None  GI Procedures:  EGD 03/28/2010 and colonoscopy 02/28/2012 - Normal esophagus.           - Gastritis. Biopsied.           - Normal examined duodenum.  Diagnosis: Small bowel, duodenum, biopsy - No  significant pathologic abnormality - No villus abnormalities identified  Diagnosis: A: Colon, biopsy - Patchy increase in crypt and surface epithelial lymphocytes, suspicious for lymphocytic colitis  B: Colon, transverse, biopsy - Adenomatous polyp (1 fragment) - No high grade dysplasia identified  Past Medical History:  Diagnosis Date  . Allergy   . Anxiety   . Asthma   . Bipolar disorder ([place])   . Bursitis   . Depression   . Essential hypertension 03/28/2017  . Frequent headaches   . GERD (gastroesophageal reflux disease)   . Headache   . High cholesterol   . Hypertension   . Irritable bowel syndrome (IBS)   . Irritable colon 02/23/2010  . Migraine 04/02/2017  . Obesity (BMI 30-39.9) 11/25/2013  . Seizure ([place]) [phone]  . Seizures ([place])   . Tremor     Past Surgical History:  Procedure Laterality Date  . NO PAST SURGERIES      Current Outpatient Medications:  .  ACCU-CHEK AVIVA PLUS test strip, USE 1 STRIP TO CHECK GLUCOSE UP TO 3 TIMES DAILY AS DIRECTED., Disp: 300 each, Rfl: 1 .  Accu-Chek Softclix Lancets lancets, Use to check blood sugar up to 3 times daily as instructed, Disp: 300 each, Rfl: 1 .  acetaminophen (TYLENOL) 500 MG tablet, Take 500 mg by mouth every 6 (six) hours as needed., Disp: , Rfl:  .  albuterol (PROAIR HFA) 108 (90 Base) MCG/ACT inhaler, Inhale 2 puffs into the lungs every 4 (four) hours as needed for wheezing or shortness of breath., Disp: 8 g, Rfl: 5 .  atorvastatin (LIPITOR) 40 MG tablet, Take 1 tablet (40 mg total) by mouth daily., Disp: 90 tablet, Rfl: 1 .  baclofen (LIORESAL) 10 MG tablet, Take 0.5-1 tablets (5-10 mg total) by mouth 3 (three) times daily as needed for muscle spasms., Disp: 90 each, Rfl: 2 .  benztropine (COGENTIN) 1 MG tablet, Take 1 tablet (1 mg total) by mouth 3 (three) times daily., Disp: 270 tablet, Rfl: 0 .  blood glucose meter kit and supplies, Dispense based on patient and insurance preference. Use up to four times  daily as directed. (FOR ICD-10 E10.9, E11.9)., Disp: 1 each, Rfl: 0 .  Blood Glucose Monitoring Suppl (ACCU-CHEK AVIVA PLUS) w/Device KIT, Use to check blood sugar up to 3 times daily, Disp: 1 kit, Rfl: 0 .  Calcium Carb-Cholecalciferol (CALCIUM 600+D3 PO), Take 1 tablet by mouth daily., Disp: , Rfl:  .  Dulaglutide (TRULICITY) 1.5 UX/3.2GM SOPN, Inject 1.5 mg into the skin every 7 (seven) days., Disp: 4 pen, Rfl: 12 .  esomeprazole (NEXIUM) 20 MG capsule, Take 1 capsule (20 mg total) by mouth daily at 12 noon., Disp: 90 capsule, Rfl: 3 .  Fluticasone-Umeclidin-Vilant (TRELEGY ELLIPTA) 100-62.5-25 MCG/INH AEPB, Inhale 1 puff into the lungs daily., Disp: 30 each, Rfl: 5 .  gabapentin (NEURONTIN) 300 MG capsule, Take 1 capsule (300 mg total) by mouth 2 (two) times daily. For restless legs, Disp: 180 capsule, Rfl: 1 .  ibuprofen (ADVIL,MOTRIN) 200 MG tablet, Take 400-800 mg by mouth every 6 (six) hours as needed (for pain or headaches)., Disp: , Rfl:  .  linaclotide (LINZESS) 145 MCG CAPS capsule, Take 1 capsule (145 mcg total) by mouth daily before breakfast., Disp: 30 capsule, Rfl: 2 .  lisinopril (ZESTRIL) 10 MG tablet, Take 1 tablet (10 mg total) by mouth daily., Disp: 90 tablet, Rfl: 0 .  Multiple Vitamins-Minerals (MULTIVITAMIN ADULT EXTRA C) CHEW, Chew by mouth., Disp: , Rfl:  .  norethindrone-ethinyl estradiol (JUNEL FE 1/20) 1-20 MG-MCG tablet, Take 1 tablet by mouth daily., Disp: 84 tablet, Rfl: 0 .  OLANZapine (ZYPREXA) 15 MG tablet, Take 1 tablet (15 mg total) by mouth at bedtime., Disp: 90 tablet, Rfl: 0 .  omega-3 acid ethyl esters (LOVAZA) 1 g capsule, Take 1 capsule by mouth twice daily, Disp: 180 capsule, Rfl: 1 .  primidone (MYSOLINE) 50 MG tablet, Take 1 tablet (50 mg total) by mouth at bedtime., Disp: 90 tablet, Rfl: 1 .  sertraline (ZOLOFT) 100 MG tablet, Take 1 tablet (100 mg total) by mouth daily., Disp: 90 tablet, Rfl: 0 .  traZODone (DESYREL) 100 MG tablet, Take 1.5 tablets (150  mg total) by mouth at bedtime. For sleep, Disp: 135 tablet, Rfl: 0 .  Na Sulfate-K Sulfate-Mg Sulf (SUPREP BOWEL PREP KIT) 17.5-3.13-1.6 GM/177ML SOLN, Take 1 kit by mouth as directed., Disp: 354 mL, Rfl: 0   Family History  Problem Relation Age of Onset  . Bipolar disorder Mother   . Schizophrenia Mother   . Hypertension Mother   . Diabetes Mother   . Cancer Mother   . Anxiety disorder Mother   . ADD / ADHD Mother   . Alcohol abuse Mother   . Drug abuse Mother   . Bipolar disorder Sister   . Anxiety disorder Sister   . Drug abuse Sister   . Bipolar disorder Brother   . Anxiety disorder Brother   . Drug abuse Brother   . Hypertension Father   . Cancer Father   . Breast cancer Maternal Aunt   .  Breast cancer Paternal Aunt   . Breast cancer Maternal Grandmother   . Alcohol abuse Maternal Grandmother   . Breast cancer Paternal Grandmother   . Parkinson's disease Maternal Grandfather   . Alcohol abuse Maternal Grandfather   . Alcohol abuse Maternal Uncle   . ADD / ADHD Son   . Tremor Neg Hx      Social History   Tobacco Use  . Smoking status: Current Every Day Smoker    Packs/day: 1.00    Years: 27.00    Pack years: 27.00    Types: Cigarettes  . Smokeless tobacco: Never Used  Substance Use Topics  . Alcohol use: Not Currently  . Drug use: No    Allergies as of 03/19/2019 - Review Complete 03/19/2019  Allergen Reaction Noted  . Amitriptyline Other (See Comments) 04/01/2015    Review of Systems:    All systems reviewed and negative except where noted in HPI.   Physical Exam:  BP (!) 143/74   Pulse 86   Temp (!) 97.2 F (36.2 C) (Temporal)   Ht '5\' 2"'$  (1.575 m)   Wt 182 lb 3.2 oz (82.6 kg)   BMI 33.32 kg/m  No LMP recorded. (Menstrual status: Perimenopausal).  General:   Alert,  Well-developed, well-nourished, pleasant and cooperative in NAD Head:  Normocephalic and atraumatic. Eyes:  Sclera clear, no icterus.   Conjunctiva pink. Ears:  Normal auditory  acuity. Nose:  No deformity, discharge, or lesions. Mouth:  No deformity or lesions,oropharynx pink & moist. Neck:  Supple; no masses or thyromegaly. Lungs:  Respirations even and unlabored.  Clear throughout to auscultation.   No wheezes, crackles, or rhonchi. No acute distress. Heart:  Regular rate and rhythm; no murmurs, clicks, rubs, or gallops. Abdomen:  Normal bowel sounds. Soft, mild epigastric tenderness and non-distended without masses, hepatosplenomegaly or hernias noted.  No guarding or rebound tenderness.   Rectal: Not performed Msk:  Symmetrical without gross deformities. Good, equal movement & strength bilaterally. Pulses:  Normal pulses noted. Extremities:  No clubbing or edema.  No cyanosis. Neurologic:  Alert and oriented x3;  grossly normal neurologically. Skin:  Intact without significant lesions or rashes. No jaundice. Psych:  Alert and cooperative. Normal mood and affect.  Imaging Studies: Reviewed  Assessment and Plan:   Vanessa Baker is a 44 y.o. female with history of bipolar, PTSD, panic disorder, drug-induced parkinsonism, seizure disorder, history of diabetes, polysubstance abuse, chronic tobacco use, fatty liver, cholelithiasis is seen in consultation for chronic diarrhea, previous history of lymphocytic colitis  Chronic diarrhea, ? Lymphocytic colitis GI profile PCR to evaluate for infection Check pancreatic fecal elastase given chronic tobacco use to evaluate for pancreatic insufficiency screen for celiac disease Recommend colonoscopy with repeat biopsies, TI evaluation, if she has persistent lymphocytic colitis, will treat accordingly Suggested to take Imodium as needed Strongly advised her that she should think about quitting tobacco  Epigastric pain H. pylori breath test Check CRP, serum lipase Recommend EGD for further evaluation  History of adenomatous polyp of the transverse colon in 2014 Recommend repeat colonoscopy   Follow up in 2  months   Cephas Darby, MD

## 2019-03-20 LAB — GASTROINTESTINAL PANEL BY PCR, STOOL (REPLACES STOOL CULTURE)

## 2019-03-21 LAB — CELIAC DISEASE PANEL
Endomysial Ab, IgA: NEGATIVE
IgA: 123 mg/dL (ref 87–352)
Tissue Transglutaminase Ab, IgA: <2 U/mL (ref 0–3)

## 2019-03-21 NOTE — Progress Notes (Signed)
Called patient to discuss about abnormal stool study results.  Her stool studies came back positive for Campylobacter infection.  Given her duration of diarrhea, recommend to treat with 3 days of azithromycin 500 mg daily.  HIV status negative in 2019.  Recommended to have her tested again.  Her husband was next to her on phone Patient has history of lymphocytic colitis on previous colonoscopy, recommend to repeat colonoscopy as scheduled after she finishes the course of antibiotics Patient says she will pick up the antibiotic on 2/17 when she gets her Social Security

## 2019-03-23 ENCOUNTER — Other Ambulatory Visit: Payer: Self-pay | Admitting: Family Medicine

## 2019-03-23 DIAGNOSIS — I10 Essential (primary) hypertension: Secondary | ICD-10-CM

## 2019-03-25 ENCOUNTER — Ambulatory Visit: Payer: Medicare HMO | Admitting: Gastroenterology

## 2019-03-25 ENCOUNTER — Other Ambulatory Visit: Payer: Self-pay

## 2019-03-25 DIAGNOSIS — G251 Drug-induced tremor: Secondary | ICD-10-CM | POA: Diagnosis not present

## 2019-03-25 DIAGNOSIS — R251 Tremor, unspecified: Secondary | ICD-10-CM | POA: Diagnosis not present

## 2019-03-25 MED ORDER — AZITHROMYCIN 500 MG PO TABS: 500.0000 mg | ORAL_TABLET | Freq: Every day | ORAL | 0 refills | Status: DC

## 2019-03-27 ENCOUNTER — Other Ambulatory Visit: Payer: Medicare HMO | Attending: Gastroenterology

## 2019-03-30 ENCOUNTER — Telehealth: Payer: Self-pay

## 2019-03-30 NOTE — Telephone Encounter (Signed)
Called and left a message for call back. Sent mychart message  °

## 2019-03-30 NOTE — Telephone Encounter (Signed)
Patient did not go for COVID test. Patient will need to rescheduled her colonoscopy scheduled for 03/31/2019. Called patient and left a detail message for call back explaining this

## 2019-03-30 NOTE — Telephone Encounter (Signed)
Called and left a message for call back  

## 2019-03-31 ENCOUNTER — Ambulatory Visit: Admission: RE | Admit: 2019-03-31 | Payer: Medicare HMO | Source: Home / Self Care | Admitting: Gastroenterology

## 2019-03-31 ENCOUNTER — Encounter: Admission: RE | Payer: Self-pay | Source: Home / Self Care

## 2019-03-31 SURGERY — COLONOSCOPY WITH PROPOFOL
Anesthesia: General

## 2019-04-08 ENCOUNTER — Telehealth: Payer: Self-pay | Admitting: Nurse Practitioner
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
## 2019-04-08 NOTE — Telephone Encounter (Signed)
   04/08/2019  Name: Vanessa Baker   MRN: [id number]   DOB: 04/09/1975   AGE: 44 y.o.   GENDER: female   PCP Mikey College, NP (Inactive).   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  Phone call 51 minutes.  Email to patient with request for additional identification documents. Referral to [employer] [facility] for Mr. Fuerst and spouse   [place] . [email or web address]  [phone]

## 2019-04-09 ENCOUNTER — Other Ambulatory Visit: Payer: Self-pay

## 2019-04-09 ENCOUNTER — Ambulatory Visit (INDEPENDENT_AMBULATORY_CARE_PROVIDER_SITE_OTHER): Payer: Medicare HMO | Admitting: Psychiatry

## 2019-04-09 ENCOUNTER — Encounter: Payer: Self-pay | Admitting: Psychiatry

## 2019-04-09 DIAGNOSIS — R69 Illness, unspecified: Secondary | ICD-10-CM | POA: Diagnosis not present

## 2019-04-09 DIAGNOSIS — F4310 Post-traumatic stress disorder, unspecified: Secondary | ICD-10-CM | POA: Diagnosis not present

## 2019-04-09 DIAGNOSIS — F1211 Cannabis abuse, in remission: Secondary | ICD-10-CM

## 2019-04-09 DIAGNOSIS — F17200 Nicotine dependence, unspecified, uncomplicated: Secondary | ICD-10-CM

## 2019-04-09 DIAGNOSIS — F1011 Alcohol abuse, in remission: Secondary | ICD-10-CM

## 2019-04-09 DIAGNOSIS — Z79899 Other long term (current) drug therapy: Secondary | ICD-10-CM | POA: Diagnosis not present

## 2019-04-09 DIAGNOSIS — G2111 Neuroleptic induced parkinsonism: Secondary | ICD-10-CM | POA: Diagnosis not present

## 2019-04-09 DIAGNOSIS — F3176 Bipolar disorder, in full remission, most recent episode depressed: Secondary | ICD-10-CM | POA: Diagnosis not present

## 2019-04-09 DIAGNOSIS — G2581 Restless legs syndrome: Secondary | ICD-10-CM | POA: Diagnosis not present

## 2019-04-09 DIAGNOSIS — F1421 Cocaine dependence, in remission: Secondary | ICD-10-CM

## 2019-04-09 MED ORDER — LITHIUM CARBONATE 300 MG PO CAPS: 300.0000 mg | ORAL_CAPSULE | Freq: Two times a day (BID) | ORAL | 0 refills | Status: DC

## 2019-04-09 NOTE — Progress Notes (Signed)
Provider Location : [facility] Patient Location : Home  Virtual Visit via Video Note  I connected with Vanessa Baker on 04/09/19 at  3:00 PM EST by a video enabled telemedicine application and verified that I am speaking with the correct person using two identifiers.   I discussed the limitations of evaluation and management by telemedicine and the availability of in person appointments. The patient expressed understanding and agreed to proceed.     I discussed the assessment and treatment plan with the patient. The patient was provided an opportunity to ask questions and all were answered. The patient agreed with the plan and demonstrated an understanding of the instructions.   The patient was advised to call back or seek an in-person evaluation if the symptoms worsen or if the condition fails to improve as anticipated.  [place] MD OP Progress Note  04/09/2019 3:39 PM Vanessa Baker  MRN:  [id number]  Chief Complaint:  Chief Complaint    Follow-up     HPI: Vanessa Baker is a 44 year old Caucasian femalemale, divorced, lives in [place], has a history of bipolar disorder, PTSD, neuroleptic induced parkinsonism, cocaine use disorder in remission, tobacco use disorder, cannabis use disorder, hyperlipidemia, seizure disorder, IBS was evaluated by telemedicine today.  Patient today reports her diarrhea and other GI problems have improved.  She reports she had a bacterial infection and was treated for it.  She reports she did not do well without the lithium which was asked to be stopped due to the diarrhea during her last session.  She reports she restarted the lithium when she found out her diarrhea was bacterial and not due to the lithium.  She is currently back on the lithium and is doing well with regards to her mood symptoms.  She reports sleep as good on the trazodone.  Patient denies any suicidality, homicidality or perceptual disturbances.  Collateral information was obtained from  husband- Vanessa Baker - " who reports patient is doing well and is compliant on medications as prescribed..'  Discussed with patient about getting her labs including her lithium level as well as metabolic panel.  She agrees to go to the nearest [facility].  Patient denies any other concerns today. Visit Diagnosis:    ICD-10-CM   1. Bipolar disorder, in full remission, most recent episode depressed ([place])  F31.76 lithium carbonate 300 MG capsule  2. PTSD (post-traumatic stress disorder)  F43.10   3. Neuroleptic induced Parkinsonism ([place])  G21.11   4. Restless leg syndrome  G25.81   5. Cocaine use disorder, moderate, in sustained remission ([facility])  F14.21   6. Alcohol use disorder, mild, in early remission  F10.11   7. Cannabis use disorder, mild, in early remission  F12.11   8. Tobacco use disorder  F17.200   9. High risk medication use  Z79.899 Lithium level    Basic metabolic panel    CBC With Differential    Lipid panel    Hemoglobin A1C    Prolactin    Past Psychiatric History: I have reviewed past psychiatric history from my progress note on 04/02/2017.  Past trials of Seroquel, Latuda, Ambien, clonazepam, Topamax, gabapentin, Trintellix, Abilify, Rexulti, Artane.  Past Medical History:  Past Medical History:  Diagnosis Date  . Allergy   . Anxiety   . Asthma   . Bipolar disorder ([place])   . Bursitis   . Depression   . Essential hypertension 03/28/2017  . Frequent headaches   . GERD (gastroesophageal reflux disease)   . Headache   .  High cholesterol   . Hypertension   . Irritable bowel syndrome (IBS)   . Irritable colon 02/23/2010  . Migraine 04/02/2017  . Obesity (BMI 30-39.9) 11/25/2013  . Seizure ([place]) [phone]  . Seizures ([place])   . Tremor     Past Surgical History:  Procedure Laterality Date  . NO PAST SURGERIES      Family Psychiatric History: I have reviewed family psychiatric history from my progress note on 04/02/2017.  Family History:  Family History  Problem Relation  Age of Onset  . Bipolar disorder Mother   . Schizophrenia Mother   . Hypertension Mother   . Diabetes Mother   . Cancer Mother   . Anxiety disorder Mother   . ADD / ADHD Mother   . Alcohol abuse Mother   . Drug abuse Mother   . Bipolar disorder Sister   . Anxiety disorder Sister   . Drug abuse Sister   . Bipolar disorder Brother   . Anxiety disorder Brother   . Drug abuse Brother   . Hypertension Father   . Cancer Father   . Breast cancer Maternal Aunt   . Breast cancer Paternal Aunt   . Breast cancer Maternal Grandmother   . Alcohol abuse Maternal Grandmother   . Breast cancer Paternal Grandmother   . Parkinson's disease Maternal Grandfather   . Alcohol abuse Maternal Grandfather   . Alcohol abuse Maternal Uncle   . ADD / ADHD Son   . Tremor Neg Hx     Social History: I have reviewed social history from my progress note on 04/02/2017. Social History   Socioeconomic History  . Marital status: Married    Spouse name: dewey   . Number of children: 1  . Years of education: 27  . Highest education level: Associate degree: occupational, Hotel manager, or vocational program  Occupational History  . Occupation: disability  Tobacco Use  . Smoking status: Current Every Day Smoker    Packs/day: 1.00    Years: 27.00    Pack years: 27.00    Types: Cigarettes  . Smokeless tobacco: Never Used  Substance and Sexual Activity  . Alcohol use: Not Currently  . Drug use: No  . Sexual activity: Yes    Partners: Male    Birth control/protection: None  Other Topics Concern  . Not on file  Social History Narrative   Lives with husband, Vanessa Baker (on [place])   Right-handed   Caffeine use: 1 cup coffee a day, 2 soft drinks per day   Social Determinants of Health   Financial Resource Strain:   . Difficulty of Paying Living Expenses: Not on file  Food Insecurity:   . Worried About Charity fundraiser in the Last Year: Not on file  . Ran Out of Food in the Last Year: Not on file   Transportation Needs:   . Lack of Transportation (Medical): Not on file  . Lack of Transportation (Non-Medical): Not on file  Physical Activity: Insufficiently Active  . Days of Exercise per Week: 3 days  . Minutes of Exercise per Session: 30 min  Stress:   . Feeling of Stress : Not on file  Social Connections:   . Frequency of Communication with Friends and Family: Not on file  . Frequency of Social Gatherings with Friends and Family: Not on file  . Attends Religious Services: Not on file  . Active Member of Clubs or Organizations: Not on file  . Attends Archivist Meetings: Not  on file  . Marital Status: Not on file    Allergies:  Allergies  Allergen Reactions  . Amitriptyline Other (See Comments)    Confusion, paralysis    Metabolic Disorder Labs: Lab Results  Component Value Date   HGBA1C 5.3 12/19/2018   MPG 105 12/19/2018   MPG 128 11/19/2017   Lab Results  Component Value Date   PROLACTIN 4.6 (L) 04/22/2017   PROLACTIN 10.2 11/10/2015   Lab Results  Component Value Date   CHOL 112 12/19/2018   TRIG 172 (H) 12/19/2018   HDL 33 (L) 12/19/2018   CHOLHDL 3.4 12/19/2018   LDLCALC 54 12/19/2018   [place]  03/05/2018     Comment:     . LDL cholesterol not calculated. Triglyceride levels greater than 400 mg/dL invalidate calculated LDL results. . Reference range: <100 . Desirable range <100 mg/dL for primary prevention;   <70 mg/dL for patients with CHD or diabetic patients  with > or = 2 CHD risk factors. Marland Kitchen LDL-C is now calculated using the Martin-Hopkins  calculation, which is a validated novel method providing  better accuracy than the Friedewald equation in the  estimation of LDL-C.  Cresenciano Genre et al. Annamaria Helling. 5361;443(15): 2061-2068  (http://education.Quest[employer].com/faq/FAQ164)    Lab Results  Component Value Date   TSH 2.928 11/14/2018   TSH 2.08 11/19/2017    Therapeutic Level Labs: Lab Results  Component Value Date   LITHIUM  0.32 (L) 11/14/2018   LITHIUM 0.33 (L) 06/19/2018   Lab Results  Component Value Date   VALPROATE 61.4 08/13/2017   VALPROATE <10 (L) 04/22/2017   No components found for:  CBMZ  Current Medications: Current Outpatient Medications  Medication Sig Dispense Refill  . ACCU-CHEK AVIVA PLUS test strip USE 1 STRIP TO CHECK GLUCOSE UP TO 3 TIMES DAILY AS DIRECTED. 300 each 1  . Accu-Chek Softclix Lancets lancets Use to check blood sugar up to 3 times daily as instructed 300 each 1  . acetaminophen (TYLENOL) 500 MG tablet Take 500 mg by mouth every 6 (six) hours as needed.    Marland Kitchen albuterol (PROAIR HFA) 108 (90 Base) MCG/ACT inhaler Inhale 2 puffs into the lungs every 4 (four) hours as needed for wheezing or shortness of breath. 8 g 5  . atorvastatin (LIPITOR) 40 MG tablet Take 1 tablet (40 mg total) by mouth daily. 90 tablet 1  . azithromycin (ZITHROMAX) 500 MG tablet Take 1 tablet (500 mg total) by mouth daily. 3 tablet 0  . baclofen (LIORESAL) 10 MG tablet Take 0.5-1 tablets (5-10 mg total) by mouth 3 (three) times daily as needed for muscle spasms. 90 each 2  . benztropine (COGENTIN) 1 MG tablet Take 1 tablet (1 mg total) by mouth 3 (three) times daily. 270 tablet 0  . blood glucose meter kit and supplies Dispense based on patient and insurance preference. Use up to four times daily as directed. (FOR ICD-10 E10.9, E11.9). 1 each 0  . Blood Glucose Monitoring Suppl (ACCU-CHEK AVIVA PLUS) w/Device KIT Use to check blood sugar up to 3 times daily 1 kit 0  . budesonide-formoterol (SYMBICORT) 160-4.5 MCG/ACT inhaler Inhale into the lungs.    . Calcium Carb-Cholecalciferol (CALCIUM 600+D3 PO) Take 1 tablet by mouth daily.    . Dulaglutide (TRULICITY) 1.5 QM/0.8QP SOPN Inject 1.5 mg into the skin every 7 (seven) days. 4 pen 12  . esomeprazole (NEXIUM) 20 MG capsule Take 1 capsule (20 mg total) by mouth daily at 12 noon. 90 capsule 3  .  Fluticasone-Umeclidin-Vilant (TRELEGY ELLIPTA) 100-62.5-25 MCG/INH  AEPB Inhale 1 puff into the lungs daily. 30 each 5  . gabapentin (NEURONTIN) 300 MG capsule Take 1 capsule (300 mg total) by mouth 2 (two) times daily. For restless legs 180 capsule 1  . ibuprofen (ADVIL,MOTRIN) 200 MG tablet Take 400-800 mg by mouth every 6 (six) hours as needed (for pain or headaches).    . linaclotide (LINZESS) 145 MCG CAPS capsule Take 1 capsule (145 mcg total) by mouth daily before breakfast. 30 capsule 2  . lisinopril (ZESTRIL) 10 MG tablet Take 1 tablet by mouth once daily 90 tablet 0  . lithium carbonate 300 MG capsule Take 1 capsule (300 mg total) by mouth 2 (two) times daily with a meal. 180 capsule 0  . metFORMIN (GLUCOPHAGE) 500 MG tablet Take 500 mg by mouth 2 (two) times daily.    . metFORMIN (GLUCOPHAGE) 500 MG tablet Take by mouth.    . Multiple Vitamins-Minerals (MULTIVITAMIN ADULT EXTRA C) CHEW Chew by mouth.    . Na Sulfate-K Sulfate-Mg Sulf (SUPREP BOWEL PREP KIT) 17.5-3.13-1.6 GM/177ML SOLN Take 1 kit by mouth as directed. 354 mL 0  . norethindrone-ethinyl estradiol (JUNEL FE 1/20) 1-20 MG-MCG tablet Take 1 tablet by mouth daily. 84 tablet 0  . OLANZapine (ZYPREXA) 15 MG tablet Take 1 tablet (15 mg total) by mouth at bedtime. 90 tablet 0  . omega-3 acid ethyl esters (LOVAZA) 1 g capsule Take 1 capsule by mouth twice daily 180 capsule 1  . Omega-3 Fatty Acids (RA FISH OIL) 1000 MG CAPS Take by mouth.    . primidone (MYSOLINE) 50 MG tablet Take 1 tablet (50 mg total) by mouth at bedtime. 90 tablet 1  . sertraline (ZOLOFT) 100 MG tablet Take 1 tablet (100 mg total) by mouth daily. 90 tablet 0  . traZODone (DESYREL) 100 MG tablet Take 1.5 tablets (150 mg total) by mouth at bedtime. For sleep 135 tablet 0   No current facility-administered medications for this visit.     Musculoskeletal: Strength & Muscle Tone: UTA Gait & Station: normal Patient leans: N/A  Psychiatric Specialty Exam: Review of Systems  Neurological: Positive for tremors.   Psychiatric/Behavioral: Negative for agitation, behavioral problems, confusion, decreased concentration, dysphoric mood, hallucinations, self-injury, sleep disturbance and suicidal ideas. The patient is not nervous/anxious and is not hyperactive.   All other systems reviewed and are negative.   There were no vitals taken for this visit.There is no height or weight on file to calculate BMI.  General Appearance: Casual  Eye Contact:  Fair  Speech:  Clear and Coherent  Volume:  Normal  Mood:  Euthymic  Affect:  Congruent  Thought Process:  Goal Directed and Descriptions of Associations: Intact  Orientation:  Full (Time, Place, and Person)  Thought Content: Logical   Suicidal Thoughts:  No  Homicidal Thoughts:  No  Memory:  Immediate;   Fair Recent;   Fair Remote;   Fair  Judgement:  Fair  Insight:  Fair  Psychomotor Activity:  Tremor  Concentration:  Concentration: Fair and Attention Span: Fair  Recall:  [employer] of Knowledge: Fair  Language: Fair  Akathisia:  No  Handed:  Right  AIMS (if indicated):Has tremors of BL hands  Assets:  Communication Skills Desire for Improvement Housing Intimacy Social Support  ADL's:  Intact  Cognition: WNL  Sleep:  Fair   Screenings: AIMS     Office Visit from 04/07/2018 in [facility] Office Visit from 01/01/2018 in  [facility] Total Score  10  1    GAD-7     Office Visit from 12/16/2018 in [facility] Visit from 09/06/2017 in [facility]  Total GAD-7 Score  8  8    PHQ2-9     Office Visit from 01/09/2019 in [facility] Office Visit from 12/30/2018 in [facility] Office Visit from 12/16/2018 in [facility] Office Visit from 07/25/2018 in [facility] Office Visit from 06/09/2018 in [facility]  PHQ-2 Total Score  0  0  3  2  0  PHQ-9 Total Score  0  _0 --        Assessment and Plan: Moorea is a 44 year old Caucasian female who has a history of bipolar disorder, PTSD, panic disorder, drug induced parkinsonian some, seizure disorder, diabetes melitis, polysubstance abuse was evaluated by telemedicine today.  She is biologically predisposed given her history of trauma as well as multiple medical problems, mental health problems in her family and past history of substance abuse problems.  Patient with psychosocial stressors of the current pandemic is currently stable on medications.  Plan as noted below.  Plan Bipolar disorder in remission Lithium 300 mg p.o. twice daily with meals. Zoloft 100 mg p.o. daily Zyprexa 15 mg p.o. nightly   Panic attacks-stable Zoloft 100 mg p.o. daily  PTSD-stable Zoloft 100 mg p.o. daily  Insomnia-stable Trazodone 100 mg p.o. nightly   Drug-induced Parkinson's disease-stable Continue Cogentin 1 mg p.o. 3 times daily She will continue to follow-up with neurology for her tremors.  Alcohol use disorder/cocaine and cannabis use disorder in remission She continues to stay sober  Tobacco use disorder-unstable She is not ready to quit.  Provided smoking cessation counseling.  High risk medication use-will order the following labs.  Will order lithium level, BMP, lipid panel, hemoglobin A1c, prolactin, CBC with differential. Lab slip will be mailed to her.  She will go to [facility].  Collateral information obtained from husband-as summarized above.  Follow-up in clinic in 6 to 8 weeks or sooner if needed.  I have spent atleast 30 minutes non face to face with patient today. More than 50 % of the time was spent for preparing to see the patient ( e.g., review of test, records ), obtaining and to review and separately obtained history , ordering medications and test ,psychoeducation and supportive psychotherapy and care coordination,as well as documenting clinical information in electronic health  record,interpreting results of test and communication of results This note was generated in part or whole with voice recognition software. Voice recognition is usually quite accurate but there are transcription errors that can and very often do occur. I apologize for any typographical errors that were not detected and corrected.      Ursula Alert, MD 04/09/2019, 3:39 PM

## 2019-04-09 NOTE — Telephone Encounter (Signed)
LVM to follow up on identification copies to give options for pt to come to office to have copies made if she'd prefer. knb

## 2019-04-09 NOTE — Telephone Encounter (Signed)
Resource Email for [employer]  pt had mentioned that they didn't have enough money for any new clothing From: Jill Alexanders [facility])  Sent: Thursday, April 09, 2019 3:08 PM To: 'Shanyiah [email or web address]' [email or web address]> Subject: RE: Secure: Education officer, museum Request  Ms. Claros, I wanted to make you aware there is a free clothing drive this Saturday in [place] if you may be interested. See information below

## 2019-04-21 ENCOUNTER — Other Ambulatory Visit: Payer: Self-pay | Admitting: Family Medicine

## 2019-04-21 DIAGNOSIS — N926 Irregular menstruation, unspecified: Secondary | ICD-10-CM

## 2019-04-21 DIAGNOSIS — G2581 Restless legs syndrome: Secondary | ICD-10-CM

## 2019-04-22 ENCOUNTER — Other Ambulatory Visit: Payer: Self-pay

## 2019-04-22 ENCOUNTER — Other Ambulatory Visit: Payer: Self-pay | Admitting: Family Medicine

## 2019-04-22 DIAGNOSIS — I10 Essential (primary) hypertension: Secondary | ICD-10-CM

## 2019-04-22 DIAGNOSIS — N926 Irregular menstruation, unspecified: Secondary | ICD-10-CM

## 2019-04-22 DIAGNOSIS — G2581 Restless legs syndrome: Secondary | ICD-10-CM

## 2019-04-22 MED ORDER — NORETHIN ACE-ETH ESTRAD-FE 1-20 MG-MCG PO TABS: 1.0000 | ORAL_TABLET | Freq: Every day | ORAL | 0 refills | Status: DC

## 2019-04-22 MED ORDER — BACLOFEN 10 MG PO TABS: 10.0000 mg | ORAL_TABLET | Freq: Three times a day (TID) | ORAL | 0 refills | Status: DC | PRN

## 2019-04-22 MED ORDER — LISINOPRIL 10 MG PO TABS: 10.0000 mg | ORAL_TABLET | Freq: Every day | ORAL | 0 refills | Status: DC

## 2019-04-26 ENCOUNTER — Emergency Department: Payer: Medicare HMO

## 2019-04-26 ENCOUNTER — Inpatient Hospital Stay
Admission: EM | Admit: 2019-04-26 | Discharge: 2019-05-02 | DRG: 417 | Disposition: A | Discharge status: Home / Self Care | Payer: Medicare HMO | Attending: Internal Medicine | Admitting: Internal Medicine

## 2019-04-26 ENCOUNTER — Other Ambulatory Visit: Payer: Self-pay

## 2019-04-26 DIAGNOSIS — E1159 Type 2 diabetes mellitus with other circulatory complications: Secondary | ICD-10-CM | POA: Diagnosis present

## 2019-04-26 DIAGNOSIS — F319 Bipolar disorder, unspecified: Secondary | ICD-10-CM | POA: Diagnosis present

## 2019-04-26 DIAGNOSIS — Z8249 Family history of ischemic heart disease and other diseases of the circulatory system: Secondary | ICD-10-CM

## 2019-04-26 DIAGNOSIS — E1169 Type 2 diabetes mellitus with other specified complication: Secondary | ICD-10-CM | POA: Diagnosis present

## 2019-04-26 DIAGNOSIS — J189 Pneumonia, unspecified organism: Secondary | ICD-10-CM | POA: Diagnosis present

## 2019-04-26 DIAGNOSIS — J4541 Moderate persistent asthma with (acute) exacerbation: Secondary | ICD-10-CM | POA: Diagnosis present

## 2019-04-26 DIAGNOSIS — I10 Essential (primary) hypertension: Secondary | ICD-10-CM | POA: Diagnosis present

## 2019-04-26 DIAGNOSIS — J441 Chronic obstructive pulmonary disease with (acute) exacerbation: Secondary | ICD-10-CM | POA: Diagnosis not present

## 2019-04-26 DIAGNOSIS — J440 Chronic obstructive pulmonary disease with acute lower respiratory infection: Secondary | ICD-10-CM | POA: Diagnosis present

## 2019-04-26 DIAGNOSIS — Z79899 Other long term (current) drug therapy: Secondary | ICD-10-CM

## 2019-04-26 DIAGNOSIS — J9601 Acute respiratory failure with hypoxia: Secondary | ICD-10-CM

## 2019-04-26 DIAGNOSIS — A419 Sepsis, unspecified organism: Secondary | ICD-10-CM

## 2019-04-26 DIAGNOSIS — Z20822 Contact with and (suspected) exposure to covid-19: Secondary | ICD-10-CM | POA: Diagnosis not present

## 2019-04-26 DIAGNOSIS — Z813 Family history of other psychoactive substance abuse and dependence: Secondary | ICD-10-CM

## 2019-04-26 DIAGNOSIS — K581 Irritable bowel syndrome with constipation: Secondary | ICD-10-CM | POA: Diagnosis present

## 2019-04-26 DIAGNOSIS — E1165 Type 2 diabetes mellitus with hyperglycemia: Secondary | ICD-10-CM | POA: Diagnosis present

## 2019-04-26 DIAGNOSIS — K219 Gastro-esophageal reflux disease without esophagitis: Secondary | ICD-10-CM | POA: Diagnosis present

## 2019-04-26 DIAGNOSIS — R778 Other specified abnormalities of plasma proteins: Secondary | ICD-10-CM

## 2019-04-26 DIAGNOSIS — G251 Drug-induced tremor: Secondary | ICD-10-CM | POA: Diagnosis present

## 2019-04-26 DIAGNOSIS — K8020 Calculus of gallbladder without cholecystitis without obstruction: Secondary | ICD-10-CM | POA: Diagnosis not present

## 2019-04-26 DIAGNOSIS — Z818 Family history of other mental and behavioral disorders: Secondary | ICD-10-CM

## 2019-04-26 DIAGNOSIS — Z803 Family history of malignant neoplasm of breast: Secondary | ICD-10-CM

## 2019-04-26 DIAGNOSIS — E860 Dehydration: Secondary | ICD-10-CM | POA: Diagnosis present

## 2019-04-26 DIAGNOSIS — G43909 Migraine, unspecified, not intractable, without status migrainosus: Secondary | ICD-10-CM | POA: Diagnosis present

## 2019-04-26 DIAGNOSIS — J4540 Moderate persistent asthma, uncomplicated: Secondary | ICD-10-CM | POA: Diagnosis present

## 2019-04-26 DIAGNOSIS — K812 Acute cholecystitis with chronic cholecystitis: Secondary | ICD-10-CM | POA: Diagnosis not present

## 2019-04-26 DIAGNOSIS — F17210 Nicotine dependence, cigarettes, uncomplicated: Secondary | ICD-10-CM | POA: Diagnosis present

## 2019-04-26 DIAGNOSIS — J9621 Acute and chronic respiratory failure with hypoxia: Secondary | ICD-10-CM

## 2019-04-26 DIAGNOSIS — K819 Cholecystitis, unspecified: Secondary | ICD-10-CM | POA: Diagnosis not present

## 2019-04-26 DIAGNOSIS — K810 Acute cholecystitis: Secondary | ICD-10-CM | POA: Diagnosis present

## 2019-04-26 DIAGNOSIS — Z811 Family history of alcohol abuse and dependence: Secondary | ICD-10-CM

## 2019-04-26 DIAGNOSIS — Z9981 Dependence on supplemental oxygen: Secondary | ICD-10-CM

## 2019-04-26 DIAGNOSIS — I152 Hypertension secondary to endocrine disorders: Secondary | ICD-10-CM | POA: Diagnosis present

## 2019-04-26 DIAGNOSIS — K760 Fatty (change of) liver, not elsewhere classified: Secondary | ICD-10-CM | POA: Diagnosis present

## 2019-04-26 DIAGNOSIS — Z23 Encounter for immunization: Secondary | ICD-10-CM

## 2019-04-26 DIAGNOSIS — Z833 Family history of diabetes mellitus: Secondary | ICD-10-CM

## 2019-04-26 DIAGNOSIS — F419 Anxiety disorder, unspecified: Secondary | ICD-10-CM | POA: Diagnosis present

## 2019-04-26 DIAGNOSIS — R0602 Shortness of breath: Secondary | ICD-10-CM

## 2019-04-26 DIAGNOSIS — J810 Acute pulmonary edema: Secondary | ICD-10-CM | POA: Diagnosis present

## 2019-04-26 DIAGNOSIS — Z820 Family history of epilepsy and other diseases of the nervous system: Secondary | ICD-10-CM

## 2019-04-26 DIAGNOSIS — E119 Type 2 diabetes mellitus without complications: Secondary | ICD-10-CM

## 2019-04-26 DIAGNOSIS — K660 Peritoneal adhesions (postprocedural) (postinfection): Secondary | ICD-10-CM | POA: Diagnosis present

## 2019-04-26 DIAGNOSIS — R569 Unspecified convulsions: Secondary | ICD-10-CM | POA: Diagnosis present

## 2019-04-26 DIAGNOSIS — R509 Fever, unspecified: Secondary | ICD-10-CM | POA: Diagnosis not present

## 2019-04-26 DIAGNOSIS — J80 Acute respiratory distress syndrome: Secondary | ICD-10-CM | POA: Diagnosis not present

## 2019-04-26 DIAGNOSIS — I248 Other forms of acute ischemic heart disease: Secondary | ICD-10-CM | POA: Diagnosis present

## 2019-04-26 DIAGNOSIS — Z7984 Long term (current) use of oral hypoglycemic drugs: Secondary | ICD-10-CM

## 2019-04-26 DIAGNOSIS — Z888 Allergy status to other drugs, medicaments and biological substances status: Secondary | ICD-10-CM

## 2019-04-26 DIAGNOSIS — F1421 Cocaine dependence, in remission: Secondary | ICD-10-CM | POA: Diagnosis present

## 2019-04-26 DIAGNOSIS — E669 Obesity, unspecified: Secondary | ICD-10-CM | POA: Diagnosis present

## 2019-04-26 DIAGNOSIS — Z9049 Acquired absence of other specified parts of digestive tract: Secondary | ICD-10-CM

## 2019-04-26 DIAGNOSIS — Z6833 Body mass index (BMI) 33.0-33.9, adult: Secondary | ICD-10-CM

## 2019-04-26 DIAGNOSIS — Z7951 Long term (current) use of inhaled steroids: Secondary | ICD-10-CM

## 2019-04-26 DIAGNOSIS — E7800 Pure hypercholesterolemia, unspecified: Secondary | ICD-10-CM | POA: Diagnosis present

## 2019-04-26 DIAGNOSIS — F1010 Alcohol abuse, uncomplicated: Secondary | ICD-10-CM | POA: Diagnosis present

## 2019-04-26 HISTORY — DX: Type 2 diabetes mellitus without complications: E11.9

## 2019-04-26 LAB — COMPREHENSIVE METABOLIC PANEL
ALT: 21 U/L (ref 0–44)
AST: 19 U/L (ref 15–41)
Albumin: 3.4 g/dL — ABNORMAL LOW (ref 3.5–5.0)
Alkaline Phosphatase: 120 U/L (ref 38–126)
Anion gap: 11 (ref 5–15)
BUN: 5 mg/dL — ABNORMAL LOW (ref 6–20)
CO2: 23 mmol/L (ref 22–32)
Calcium: 8.9 mg/dL (ref 8.9–10.3)
Chloride: 96 mmol/L — ABNORMAL LOW (ref 98–111)
Creatinine, Ser: 0.81 mg/dL (ref 0.44–1.00)
GFR calc Af Amer: >60 mL/min (ref >60)
GFR calc non Af Amer: >60 mL/min (ref >60)
Glucose, Bld: 269 mg/dL — ABNORMAL HIGH (ref 70–99)
Potassium: 3.9 mmol/L (ref 3.5–5.1)
Sodium: 130 mmol/L — ABNORMAL LOW (ref 135–145)
Total Bilirubin: 0.6 mg/dL (ref 0.3–1.2)
Total Protein: 7.7 g/dL (ref 6.5–8.1)

## 2019-04-26 LAB — CBC WITH DIFFERENTIAL/PLATELET
Abs Immature Granulocytes: 0.24 10*3/uL — ABNORMAL HIGH (ref 0.00–0.07)
Basophils Absolute: 0.2 10*3/uL — ABNORMAL HIGH (ref 0.0–0.1)
Basophils Relative: 1 %
Eosinophils Absolute: 0.2 10*3/uL (ref 0.0–0.5)
Eosinophils Relative: 1 %
HCT: 43 % (ref 36.0–46.0)
Hemoglobin: 13.7 g/dL (ref 12.0–15.0)
Immature Granulocytes: 1 %
Lymphocytes Relative: 10 %
Lymphs Abs: 1.7 10*3/uL (ref 0.7–4.0)
MCH: 29.3 pg (ref 26.0–34.0)
MCHC: 31.9 g/dL (ref 30.0–36.0)
MCV: 91.9 fL (ref 80.0–100.0)
Monocytes Absolute: 1.1 10*3/uL — ABNORMAL HIGH (ref 0.1–1.0)
Monocytes Relative: 6 %
Neutro Abs: 14.3 10*3/uL — ABNORMAL HIGH (ref 1.7–7.7)
Neutrophils Relative %: 81 %
Platelets: 356 10*3/uL (ref 150–400)
RBC: 4.68 MIL/uL (ref 3.87–5.11)
RDW: 13.1 % (ref 11.5–15.5)
WBC: 17.8 10*3/uL — ABNORMAL HIGH (ref 4.0–10.5)
nRBC: 0 % (ref 0.0–0.2)

## 2019-04-26 LAB — POC SARS CORONAVIRUS 2 AG: SARS Coronavirus 2 Ag: NEGATIVE

## 2019-04-26 LAB — PROTIME-INR
INR: 1 (ref 0.8–1.2)
Prothrombin Time: 13.1 seconds (ref 11.4–15.2)

## 2019-04-26 LAB — URINALYSIS, COMPLETE (UACMP) WITH MICROSCOPIC
Bilirubin Urine: NEGATIVE
Glucose, UA: 50 mg/dL — AB
Hgb urine dipstick: NEGATIVE
Ketones, ur: NEGATIVE mg/dL
Leukocytes,Ua: NEGATIVE
Nitrite: NEGATIVE
Protein, ur: NEGATIVE mg/dL
Specific Gravity, Urine: 1.006 (ref 1.005–1.030)
pH: 7 (ref 5.0–8.0)

## 2019-04-26 LAB — VALPROIC ACID LEVEL: Valproic Acid Lvl: 103 ug/mL — ABNORMAL HIGH (ref 50.0–100.0)

## 2019-04-26 LAB — GLUCOSE, CAPILLARY: Glucose-Capillary: 148 mg/dL — ABNORMAL HIGH (ref 70–99)

## 2019-04-26 LAB — LACTIC ACID, PLASMA
Lactic Acid, Venous: 1.2 mmol/L (ref 0.5–1.9)
Lactic Acid, Venous: 0.9 mmol/L (ref 0.5–1.9)

## 2019-04-26 LAB — LIPASE, BLOOD: Lipase: 26 U/L (ref 11–51)

## 2019-04-26 MED ORDER — PIPERACILLIN-TAZOBACTAM 3.375 G IVPB 30 MIN
3.3750 g | Freq: Once | INTRAVENOUS | Status: AC
  Administered 2019-04-26: 3.375 g via INTRAVENOUS
  Filled 2019-04-26: qty 50

## 2019-04-26 MED ORDER — SODIUM CHLORIDE 0.9 % IV BOLUS
1000.0000 mL | Freq: Once | INTRAVENOUS | Status: AC
  Administered 2019-04-26: 1000 mL via INTRAVENOUS

## 2019-04-26 MED ORDER — PANTOPRAZOLE SODIUM 40 MG PO TBEC
40.0000 mg | DELAYED_RELEASE_TABLET | Freq: Every day | ORAL | Status: DC
  Administered 2019-04-27 – 2019-05-02 (×6): 40 mg via ORAL
  Filled 2019-04-26 (×6): qty 1

## 2019-04-26 MED ORDER — IOHEXOL 300 MG/ML  SOLN
100.0000 mL | Freq: Once | INTRAMUSCULAR | Status: AC | PRN
  Administered 2019-04-26: 100 mL via INTRAVENOUS

## 2019-04-26 MED ORDER — GABAPENTIN 300 MG PO CAPS
300.0000 mg | ORAL_CAPSULE | Freq: Two times a day (BID) | ORAL | Status: DC
  Administered 2019-04-26 – 2019-05-02 (×12): 300 mg via ORAL
  Filled 2019-04-26 (×10): qty 1
  Filled 2019-04-26: qty 3
  Filled 2019-04-26: qty 1

## 2019-04-26 MED ORDER — SODIUM CHLORIDE 0.9 % IV SOLN: INTRAVENOUS | Status: DC

## 2019-04-26 MED ORDER — FLUTICASONE FUROATE-VILANTEROL 200-25 MCG/INH IN AEPB
1.0000 | INHALATION_SPRAY | Freq: Every day | RESPIRATORY_TRACT | Status: DC
  Administered 2019-04-27 – 2019-04-28 (×2): 1 via RESPIRATORY_TRACT
  Filled 2019-04-26: qty 28

## 2019-04-26 MED ORDER — ONDANSETRON HCL 4 MG/2ML IJ SOLN
4.0000 mg | Freq: Four times a day (QID) | INTRAMUSCULAR | Status: DC | PRN
  Administered 2019-04-27: 4 mg via INTRAVENOUS

## 2019-04-26 MED ORDER — IOHEXOL 9 MG/ML PO SOLN
500.0000 mL | Freq: Two times a day (BID) | ORAL | Status: DC | PRN
  Administered 2019-04-26 (×2): 500 mL via ORAL

## 2019-04-26 MED ORDER — NORETHIN ACE-ETH ESTRAD-FE 1-20 MG-MCG PO TABS: 1.0000 | ORAL_TABLET | Freq: Every day | ORAL | Status: DC

## 2019-04-26 MED ORDER — FLUTICASONE-UMECLIDIN-VILANT 100-62.5-25 MCG/INH IN AEPB: 1.0000 | INHALATION_SPRAY | Freq: Every day | RESPIRATORY_TRACT | Status: DC

## 2019-04-26 MED ORDER — BACLOFEN 10 MG PO TABS
10.0000 mg | ORAL_TABLET | Freq: Three times a day (TID) | ORAL | Status: DC | PRN
  Filled 2019-04-26: qty 1

## 2019-04-26 MED ORDER — PRIMIDONE 50 MG PO TABS
50.0000 mg | ORAL_TABLET | Freq: Every day | ORAL | Status: DC
  Administered 2019-04-26 – 2019-05-01 (×6): 50 mg via ORAL
  Filled 2019-04-26 (×9): qty 1

## 2019-04-26 MED ORDER — ACETAMINOPHEN 500 MG PO TABS
1000.0000 mg | ORAL_TABLET | Freq: Once | ORAL | Status: AC
  Administered 2019-04-26: 1000 mg via ORAL
  Filled 2019-04-26: qty 2

## 2019-04-26 MED ORDER — INSULIN ASPART 100 UNIT/ML ~~LOC~~ SOLN
0.0000 [IU] | SUBCUTANEOUS | Status: DC
  Administered 2019-04-26 – 2019-04-27 (×2): 2 [IU] via SUBCUTANEOUS
  Administered 2019-04-27: 5 [IU] via SUBCUTANEOUS
  Administered 2019-04-27: 11 [IU] via SUBCUTANEOUS
  Administered 2019-04-28: 5 [IU] via SUBCUTANEOUS
  Administered 2019-04-28: 3 [IU] via SUBCUTANEOUS
  Administered 2019-04-28: 8 [IU] via SUBCUTANEOUS
  Administered 2019-04-28 (×2): 2 [IU] via SUBCUTANEOUS
  Administered 2019-04-29 (×2): 5 [IU] via SUBCUTANEOUS
  Administered 2019-04-29: 11 [IU] via SUBCUTANEOUS
  Administered 2019-04-29: 2 [IU] via SUBCUTANEOUS
  Administered 2019-04-29: 8 [IU] via SUBCUTANEOUS
  Administered 2019-04-30 (×2): 3 [IU] via SUBCUTANEOUS
  Filled 2019-04-26 (×16): qty 1

## 2019-04-26 MED ORDER — TRAZODONE HCL 50 MG PO TABS
150.0000 mg | ORAL_TABLET | Freq: Every day | ORAL | Status: DC
  Administered 2019-04-26 – 2019-04-27 (×2): 150 mg via ORAL
  Administered 2019-04-28: 50 mg via ORAL
  Administered 2019-04-29 – 2019-05-01 (×3): 150 mg via ORAL
  Filled 2019-04-26 (×6): qty 1

## 2019-04-26 MED ORDER — BENZTROPINE MESYLATE 1 MG PO TABS
1.0000 mg | ORAL_TABLET | Freq: Three times a day (TID) | ORAL | Status: DC
  Administered 2019-04-26 – 2019-05-02 (×16): 1 mg via ORAL
  Filled 2019-04-26 (×22): qty 1

## 2019-04-26 MED ORDER — ALBUTEROL SULFATE HFA 108 (90 BASE) MCG/ACT IN AERS: 2.0000 | INHALATION_SPRAY | RESPIRATORY_TRACT | Status: DC | PRN

## 2019-04-26 MED ORDER — ACETAMINOPHEN 500 MG PO TABS
1000.0000 mg | ORAL_TABLET | Freq: Four times a day (QID) | ORAL | Status: DC
  Administered 2019-04-26 – 2019-05-02 (×16): 1000 mg via ORAL
  Filled 2019-04-26 (×20): qty 2

## 2019-04-26 MED ORDER — ONDANSETRON 4 MG PO TBDP
4.0000 mg | ORAL_TABLET | Freq: Four times a day (QID) | ORAL | Status: DC | PRN
  Filled 2019-04-26: qty 1

## 2019-04-26 MED ORDER — OLANZAPINE 5 MG PO TABS
15.0000 mg | ORAL_TABLET | Freq: Every day | ORAL | Status: DC
  Administered 2019-04-26 – 2019-05-01 (×6): 15 mg via ORAL
  Filled 2019-04-26 (×5): qty 2
  Filled 2019-04-26: qty 3
  Filled 2019-04-26 (×2): qty 2

## 2019-04-26 MED ORDER — SERTRALINE HCL 100 MG PO TABS
100.0000 mg | ORAL_TABLET | Freq: Every day | ORAL | Status: DC
  Administered 2019-04-28 – 2019-05-02 (×5): 100 mg via ORAL
  Filled 2019-04-26: qty 2
  Filled 2019-04-26: qty 1
  Filled 2019-04-26: qty 2
  Filled 2019-04-26 (×3): qty 1
  Filled 2019-04-26 (×2): qty 2
  Filled 2019-04-26 (×2): qty 1

## 2019-04-26 MED ORDER — LORAZEPAM 2 MG/ML IJ SOLN
1.0000 mg | Freq: Once | INTRAMUSCULAR | Status: AC
  Administered 2019-04-26: 1 mg via INTRAVENOUS
  Filled 2019-04-26: qty 1

## 2019-04-26 MED ORDER — PNEUMOCOCCAL VAC POLYVALENT 25 MCG/0.5ML IJ INJ: 0.5000 mL | INJECTION | INTRAMUSCULAR | Status: DC

## 2019-04-26 MED ORDER — PIPERACILLIN-TAZOBACTAM 3.375 G IVPB
3.3750 g | Freq: Three times a day (TID) | INTRAVENOUS | Status: DC
  Administered 2019-04-27 – 2019-04-28 (×5): 3.375 g via INTRAVENOUS
  Filled 2019-04-26 (×7): qty 50

## 2019-04-26 MED ORDER — LITHIUM CARBONATE 300 MG PO CAPS
300.0000 mg | ORAL_CAPSULE | Freq: Two times a day (BID) | ORAL | Status: DC
  Administered 2019-04-27 – 2019-05-02 (×11): 300 mg via ORAL
  Filled 2019-04-26 (×14): qty 1

## 2019-04-26 MED ORDER — HYDROMORPHONE HCL 1 MG/ML IJ SOLN
0.5000 mg | INTRAMUSCULAR | Status: DC | PRN
  Administered 2019-04-26 – 2019-04-29 (×13): 0.5 mg via INTRAVENOUS
  Filled 2019-04-26 (×3): qty 1
  Filled 2019-04-26 (×4): qty 0.5
  Filled 2019-04-26: qty 1
  Filled 2019-04-26 (×4): qty 0.5

## 2019-04-26 MED ORDER — MORPHINE SULFATE (PF) 4 MG/ML IV SOLN
4.0000 mg | Freq: Once | INTRAVENOUS | Status: AC
  Administered 2019-04-26: 4 mg via INTRAVENOUS
  Filled 2019-04-26: qty 1

## 2019-04-26 MED ORDER — ALBUTEROL SULFATE (2.5 MG/3ML) 0.083% IN NEBU: 2.5000 mg | INHALATION_SOLUTION | RESPIRATORY_TRACT | Status: DC | PRN

## 2019-04-26 NOTE — ED Notes (Signed)
X-ray at bedside

## 2019-04-26 NOTE — Progress Notes (Signed)
This is a 44 y/o F who presented to the ED with abdominal pain, fever, and chills.  WBC elevated to 17.8K.  Underwent CT scan which showed acute cholecystitis. COVID Ag negative.  Lactic acid normal. Will plan to admit, keep NPO, give IVF and IV Abx.  Laparoscopic cholecystectomy tomorrow, pending OR and anesthesia availability.  Full H&P to follow.

## 2019-04-26 NOTE — ED Provider Notes (Signed)
[facility] EMERGENCY DEPARTMENT Provider Note   CSN: [id number] Arrival date & time: 04/26/19  1706     History Chief Complaint  Patient presents with  . Fever  . Abdominal Pain    Vanessa Baker is a 44 y.o. female history diabetes, bipolar, depression here presenting with fever, chills, abdominal pain.  Patient states that Vanessa Baker has been having subjective chills and low-grade temperature for the last week or so.  Last 2 days, Vanessa Baker has been having actual fever about 101 at home.  Vanessa Baker states that Vanessa Baker is also having some tremors as well.  States that Vanessa Baker has abdominal pain as well.  Patient states Vanessa Baker is very thirsty and Vanessa Baker has history of diabetes but did not check her blood sugar.  Also has a history of IBS and states that her IBS is acting up.  Vanessa Baker states that her grandmother had Covid about a month ago but had no recent exposure to Covid.  The history is provided by the patient.       Past Medical History:  Diagnosis Date  . Allergy   . Anxiety   . Asthma   . Bipolar disorder ([place])   . Bursitis   . Depression   . Diabetes mellitus without complication ([place])   . Essential hypertension 03/28/2017  . Frequent headaches   . GERD (gastroesophageal reflux disease)   . Headache   . High cholesterol   . Hypertension   . Irritable bowel syndrome (IBS)   . Irritable colon 02/23/2010  . Migraine 04/02/2017  . Obesity (BMI 30-39.9) 11/25/2013  . Seizure ([place]) [id number]  . Seizures ([place])   . Tremor     Patient Active Problem List   Diagnosis Date Noted  . High risk medication use 04/09/2019  . Drug-induced tremor 03/25/2019  . Lymphocytic colitis 03/19/2019  . Cocaine use disorder, moderate, in sustained remission ([place]) 03/16/2019  . Chronic hip pain, bilateral 01/09/2019  . Type 2 diabetes mellitus with other specified complication ([place]) 80/22/3361  . Restless leg syndrome 11/17/2018  . Cannabis use disorder, mild, in early remission 08/06/2018  .  Alcohol use disorder, mild, abuse 08/06/2018  . Cocaine use disorder, mild, in early remission ([place]) 08/06/2018  . Neuroleptic induced Parkinsonism ([place]) 08/06/2018  . PTSD (post-traumatic stress disorder) 08/06/2018  . Essential tremor 04/02/2017  . Migraine 04/02/2017  . Essential hypertension 03/28/2017  . Moderate persistent asthma without complication 22/44/9753  . Tremor 03/07/2016  . Hyperlipidemia 05/17/2015  . Abnormal Pap smear of cervix 05/17/2015  . Tobacco use disorder 04/19/2015  . Sinusitis 04/19/2015  . Constipation 04/19/2015  . Insomnia 03/31/2015  . Dyspnea 03/31/2015  . Bipolar disorder, in partial remission, most recent episode depressed ([place]) 03/31/2015  . Depression 03/31/2015  . Headache 11/25/2013  . Obesity (BMI 30-39.9) 11/25/2013  . Regular astigmatism 05/05/2010    Past Surgical History:  Procedure Laterality Date  . NO PAST SURGERIES       OB History   No obstetric history on file.     Family History  Problem Relation Age of Onset  . Bipolar disorder Mother   . Schizophrenia Mother   . Hypertension Mother   . Diabetes Mother   . Cancer Mother   . Anxiety disorder Mother   . ADD / ADHD Mother   . Alcohol abuse Mother   . Drug abuse Mother   . Bipolar disorder Sister   . Anxiety disorder Sister   . Drug abuse Sister   .  Bipolar disorder Brother   . Anxiety disorder Brother   . Drug abuse Brother   . Hypertension Father   . Cancer Father   . Breast cancer Maternal Aunt   . Breast cancer Paternal Aunt   . Breast cancer Maternal Grandmother   . Alcohol abuse Maternal Grandmother   . Breast cancer Paternal Grandmother   . Parkinson's disease Maternal Grandfather   . Alcohol abuse Maternal Grandfather   . Alcohol abuse Maternal Uncle   . ADD / ADHD Son   . Tremor Neg Hx     Social History   Tobacco Use  . Smoking status: Current Every Day Smoker    Packs/day: 1.00    Years: 27.00    Pack years: 27.00    Types: Cigarettes    . Smokeless tobacco: Never Used  Substance Use Topics  . Alcohol use: Not Currently  . Drug use: No    Home Medications Prior to Admission medications   Medication Sig Start Date End Date Taking? Authorizing Provider  ACCU-CHEK AVIVA PLUS test strip USE 1 STRIP TO CHECK GLUCOSE UP TO 3 TIMES DAILY AS DIRECTED. 12/23/18   Parks Ranger, Devonne Doughty, DO  Accu-Chek Softclix Lancets lancets Use to check blood sugar up to 3 times daily as instructed 12/23/18   Karamalegos, Devonne Doughty, DO  acetaminophen (TYLENOL) 500 MG tablet Take 500 mg by mouth every 6 (six) hours as needed.    [provider]  albuterol (PROAIR HFA) 108 (90 Base) MCG/ACT inhaler Inhale 2 puffs into the lungs every 4 (four) hours as needed for wheezing or shortness of breath. 12/17/18   Karamalegos, Devonne Doughty, DO  atorvastatin (LIPITOR) 40 MG tablet Take 1 tablet (40 mg total) by mouth daily. 12/17/18   Karamalegos, Devonne Doughty, DO  azithromycin (ZITHROMAX) 500 MG tablet Take 1 tablet (500 mg total) by mouth daily. 03/25/19   Lucilla Lame, MD  baclofen (LIORESAL) 10 MG tablet Take 1 tablet (10 mg total) by mouth 3 (three) times daily as needed for muscle spasms. 04/22/19   Karamalegos, Devonne Doughty, DO  benztropine (COGENTIN) 1 MG tablet Take 1 tablet (1 mg total) by mouth 3 (three) times daily. 02/10/19   Ursula Alert, MD  blood glucose meter kit and supplies Dispense based on patient and insurance preference. Use up to four times daily as directed. (FOR ICD-10 E10.9, E11.9). 09/12/17   Mikey College, NP  Blood Glucose Monitoring Suppl (ACCU-CHEK AVIVA PLUS) w/Device KIT Use to check blood sugar up to 3 times daily 12/23/18   Parks Ranger, Devonne Doughty, DO  budesonide-formoterol [facility]) 160-4.5 MCG/ACT inhaler Inhale into the lungs.    [provider]  Calcium Carb-Cholecalciferol (CALCIUM 600+D3 PO) Take 1 tablet by mouth daily.    [provider]  Dulaglutide (TRULICITY) 1.5 HU/7.6LY SOPN  Inject 1.5 mg into the skin every 7 (seven) days. 12/17/18   Karamalegos, Devonne Doughty, DO  esomeprazole (NEXIUM) 20 MG capsule Take 1 capsule (20 mg total) by mouth daily at 12 noon. 12/17/18   Karamalegos, Devonne Doughty, DO  Fluticasone-Umeclidin-Vilant (TRELEGY ELLIPTA) 100-62.5-25 MCG/INH AEPB Inhale 1 puff into the lungs daily. 12/17/18   Karamalegos, Devonne Doughty, DO  gabapentin (NEURONTIN) 300 MG capsule Take 1 capsule (300 mg total) by mouth 2 (two) times daily. For restless legs 12/16/18   Olin Hauser, DO  ibuprofen (ADVIL,MOTRIN) 200 MG tablet Take 400-800 mg by mouth every 6 (six) hours as needed (for pain or headaches).    [provider]  linaclotide (LINZESS) 145 MCG CAPS capsule Take 1 capsule (145 mcg total) by mouth daily before breakfast. 12/30/18   Karamalegos, Devonne Doughty, DO  lisinopril (ZESTRIL) 10 MG tablet Take 1 tablet (10 mg total) by mouth daily. 04/22/19   Karamalegos, Devonne Doughty, DO  lithium carbonate 300 MG capsule Take 1 capsule (300 mg total) by mouth 2 (two) times daily with a meal. 04/09/19   Ursula Alert, MD  metFORMIN (GLUCOPHAGE) 500 MG tablet Take 500 mg by mouth 2 (two) times daily. 03/27/19   [provider]  metFORMIN (GLUCOPHAGE) 500 MG tablet Take by mouth.    [provider]  Multiple Vitamins-Minerals (MULTIVITAMIN ADULT EXTRA C) CHEW Chew by mouth. 02/09/03   [provider]  Na Sulfate-K Sulfate-Mg Sulf (SUPREP BOWEL PREP KIT) 17.5-3.13-1.6 GM/177ML SOLN Take 1 kit by mouth as directed. 03/19/19   Lin Landsman, MD  norethindrone-ethinyl estradiol (JUNEL FE 1/20) 1-20 MG-MCG tablet Take 1 tablet by mouth daily. 04/22/19   Karamalegos, Alexander J, DO  OLANZapine (ZYPREXA) 15 MG tablet Take 1 tablet (15 mg total) by mouth at bedtime. 02/10/19   Ursula Alert, MD  omega-3 acid ethyl esters (LOVAZA) 1 g capsule Take 1 capsule by mouth twice daily 03/04/19   Karamalegos, Devonne Doughty, DO  Omega-3 Fatty Acids (RA  FISH OIL) 1000 MG CAPS Take by mouth.    [provider]  primidone (MYSOLINE) 50 MG tablet Take 1 tablet (50 mg total) by mouth at bedtime. 12/17/18   Karamalegos, Devonne Doughty, DO  sertraline (ZOLOFT) 100 MG tablet Take 1 tablet (100 mg total) by mouth daily. 02/10/19   Ursula Alert, MD  traZODone (DESYREL) 100 MG tablet Take 1.5 tablets (150 mg total) by mouth at bedtime. For sleep 02/10/19   Ursula Alert, MD    Allergies    Amitriptyline  Review of Systems   Review of Systems  Constitutional: Positive for fever.  Respiratory: Positive for cough.   Gastrointestinal: Positive for abdominal pain.  All other systems reviewed and are negative.   Physical Exam Updated Vital Signs BP 131/88 (BP Location: Left Arm)   Pulse (!) 115   Temp (!) 102.8 F (39.3 C) (Oral)   Resp 20   Ht 5' 2" (1.575 m)   Wt 81.6 kg   SpO2 97%   BMI 32.92 kg/m   Physical Exam Vitals and nursing note reviewed.  Constitutional:      Comments: Uncomfortable   HENT:     Head: Normocephalic.     Mouth/Throat:     Comments: MM slightly dry  Eyes:     Extraocular Movements: Extraocular movements intact.     Pupils: Pupils are equal, round, and reactive to light.  Cardiovascular:     Rate and Rhythm: Normal rate and regular rhythm.     Heart sounds: Normal heart sounds.  Pulmonary:     Comments: Crackles L base  Abdominal:     General: Abdomen is flat.     Comments: Mild diffuse tenderness, worse in the epigastrium, no rebound   Skin:    General: Skin is warm.     Capillary Refill: Capillary refill takes less than 2 seconds.  Neurological:     General: No focal deficit present.     Mental Status: Vanessa Baker is alert and oriented to person, place, and time.  Psychiatric:        Mood and Affect: Mood normal.        Behavior: Behavior normal.  ED Results / Procedures / Treatments   Labs (all labs ordered are listed, but only abnormal results are displayed) Labs Reviewed  CBC WITH  DIFFERENTIAL/PLATELET - Abnormal; Notable for the following components:      Result Value   WBC 17.8 (*)    Neutro Abs 14.3 (*)    Monocytes Absolute 1.1 (*)    Basophils Absolute 0.2 (*)    Abs Immature Granulocytes 0.24 (*)    All other components within normal limits  CULTURE, BLOOD (ROUTINE X 2)  CULTURE, BLOOD (ROUTINE X 2)  COMPREHENSIVE METABOLIC PANEL  LACTIC ACID, PLASMA  LACTIC ACID, PLASMA  PROTIME-INR  URINALYSIS, COMPLETE (UACMP) WITH MICROSCOPIC  VALPROIC ACID LEVEL  LIPASE, BLOOD  POC SARS CORONAVIRUS 2 AG -  ED    EKG None  Radiology No results found.  Procedures Procedures (including critical care time)  CRITICAL CARE Performed by: Wandra Arthurs   Total critical care time: 30 minutes  Critical care time was exclusive of separately billable procedures and treating other patients.  Critical care was necessary to treat or prevent imminent or life-threatening deterioration.  Critical care was time spent personally by me on the following activities: development of treatment plan with patient and/or surrogate as well as nursing, discussions with consultants, evaluation of patient's response to treatment, examination of patient, obtaining history from patient or surrogate, ordering and performing treatments and interventions, ordering and review of laboratory studies, ordering and review of radiographic studies, pulse oximetry and re-evaluation of patient's condition.   Medications Ordered in ED Medications  sodium chloride 0.9 % bolus 1,000 mL (has no administration in time range)  LORazepam (ATIVAN) injection 1 mg (has no administration in time range)  iohexol (OMNIPAQUE) 9 MG/ML oral solution 500 mL (has no administration in time range)  acetaminophen (TYLENOL) tablet 1,000 mg (1,000 mg Oral Given 04/26/19 1725)    ED Course  I have reviewed the triage vital signs and the nursing notes.  Pertinent labs & imaging results that were available during my care  of the patient were reviewed by me and considered in my medical decision making (see chart for details).    MDM Rules/Calculators/A&P                      Vanessa Baker is a 44 y.o. female who presented with abdominal pain and fever and cough.  Consider colitis versus pneumonia versus pyelonephritis.  Vanessa Baker also may be DKA as well.  Also consider Covid. Will get labs, lactate, cultures, UA, CXR, COVID, CT ab/pel. Will hydrate and reassess.   8:48 PM Covid is negative and white blood cell count is 17.  Sodium is 130 likely from dehydration and hyperglycemia.  CT showed acute cholecystitis.  Given antibiotics.  Dr. Celine Ahr from surgery to admit.  Final Clinical Impression(s) / ED Diagnoses Final diagnoses:  None    Rx / DC Orders ED Discharge Orders    None       Drenda Freeze, MD 04/26/19 2049

## 2019-04-26 NOTE — ED Triage Notes (Addendum)
Pt states she is here for multiple complaints. States IBS was acting up within the last week. States now she has the "shakes" and a fever. States the lowest her temp was been is 99. Fever x 2 days. C/o body aches. Appears anxious. Pt c/o of being thirsty as well. Type 2 DM. States took aleeve and daytime robutussin around 1pm.

## 2019-04-26 NOTE — ED Notes (Signed)
Pt taken to imaging

## 2019-04-26 NOTE — ED Notes (Addendum)
Pt assisted to use bathroom- gave pt a blanket and dimmed lights

## 2019-04-27 ENCOUNTER — Encounter
Admission: EM | Disposition: A | Discharge status: Home / Self Care | Payer: Self-pay | Source: Home / Self Care | Attending: General Surgery

## 2019-04-27 ENCOUNTER — Encounter: Payer: Self-pay | Admitting: General Surgery

## 2019-04-27 ENCOUNTER — Observation Stay: Payer: Medicare HMO | Admitting: Certified Registered Nurse Anesthetist

## 2019-04-27 DIAGNOSIS — Z7689 Persons encountering health services in other specified circumstances: Secondary | ICD-10-CM | POA: Diagnosis not present

## 2019-04-27 DIAGNOSIS — K810 Acute cholecystitis: Secondary | ICD-10-CM | POA: Diagnosis not present

## 2019-04-27 DIAGNOSIS — E7800 Pure hypercholesterolemia, unspecified: Secondary | ICD-10-CM | POA: Diagnosis not present

## 2019-04-27 DIAGNOSIS — K819 Cholecystitis, unspecified: Secondary | ICD-10-CM | POA: Diagnosis not present

## 2019-04-27 DIAGNOSIS — E119 Type 2 diabetes mellitus without complications: Secondary | ICD-10-CM | POA: Diagnosis not present

## 2019-04-27 DIAGNOSIS — J449 Chronic obstructive pulmonary disease, unspecified: Secondary | ICD-10-CM | POA: Diagnosis not present

## 2019-04-27 DIAGNOSIS — R69 Illness, unspecified: Secondary | ICD-10-CM | POA: Diagnosis not present

## 2019-04-27 DIAGNOSIS — K219 Gastro-esophageal reflux disease without esophagitis: Secondary | ICD-10-CM | POA: Diagnosis not present

## 2019-04-27 HISTORY — PX: CHOLECYSTECTOMY: SHX55

## 2019-04-27 LAB — COMPREHENSIVE METABOLIC PANEL
ALT: 16 U/L (ref 0–44)
AST: 19 U/L (ref 15–41)
Albumin: 2.6 g/dL — ABNORMAL LOW (ref 3.5–5.0)
Alkaline Phosphatase: 97 U/L (ref 38–126)
Anion gap: 9 (ref 5–15)
BUN: 6 mg/dL (ref 6–20)
CO2: 25 mmol/L (ref 22–32)
Calcium: 8.4 mg/dL — ABNORMAL LOW (ref 8.9–10.3)
Chloride: 107 mmol/L (ref 98–111)
Creatinine, Ser: 0.62 mg/dL (ref 0.44–1.00)
GFR calc Af Amer: >60 mL/min (ref >60)
GFR calc non Af Amer: >60 mL/min (ref >60)
Glucose, Bld: 118 mg/dL — ABNORMAL HIGH (ref 70–99)
Potassium: 3.7 mmol/L (ref 3.5–5.1)
Sodium: 141 mmol/L (ref 135–145)
Total Bilirubin: 0.4 mg/dL (ref 0.3–1.2)
Total Protein: 6.3 g/dL — ABNORMAL LOW (ref 6.5–8.1)

## 2019-04-27 LAB — CBC
HCT: 38.1 % (ref 36.0–46.0)
Hemoglobin: 12.3 g/dL (ref 12.0–15.0)
MCH: 29.6 pg (ref 26.0–34.0)
MCHC: 32.3 g/dL (ref 30.0–36.0)
MCV: 91.8 fL (ref 80.0–100.0)
Platelets: 312 10*3/uL (ref 150–400)
RBC: 4.15 MIL/uL (ref 3.87–5.11)
RDW: 13.3 % (ref 11.5–15.5)
WBC: 12 10*3/uL — ABNORMAL HIGH (ref 4.0–10.5)
nRBC: 0 % (ref 0.0–0.2)

## 2019-04-27 LAB — URINE CULTURE

## 2019-04-27 LAB — HIV ANTIBODY (ROUTINE TESTING W REFLEX): HIV Screen 4th Generation wRfx: NONREACTIVE

## 2019-04-27 LAB — GLUCOSE, CAPILLARY
Glucose-Capillary: 106 mg/dL — ABNORMAL HIGH (ref 70–99)
Glucose-Capillary: 109 mg/dL — ABNORMAL HIGH (ref 70–99)
Glucose-Capillary: 136 mg/dL — ABNORMAL HIGH (ref 70–99)
Glucose-Capillary: 189 mg/dL — ABNORMAL HIGH (ref 70–99)
Glucose-Capillary: 201 mg/dL — ABNORMAL HIGH (ref 70–99)
Glucose-Capillary: 269 mg/dL — ABNORMAL HIGH (ref 70–99)
Glucose-Capillary: 350 mg/dL — ABNORMAL HIGH (ref 70–99)

## 2019-04-27 LAB — MRSA PCR SCREENING: MRSA by PCR: NEGATIVE

## 2019-04-27 LAB — HEMOGLOBIN A1C
Hgb A1c MFr Bld: 6.1 % — ABNORMAL HIGH (ref 4.8–5.6)
Mean Plasma Glucose: 128.37 mg/dL

## 2019-04-27 LAB — PHOSPHORUS: Phosphorus: 4.5 mg/dL (ref 2.5–4.6)

## 2019-04-27 SURGERY — LAPAROSCOPIC CHOLECYSTECTOMY
Anesthesia: General

## 2019-04-27 MED ORDER — ONDANSETRON HCL 4 MG/2ML IJ SOLN
4.0000 mg | Freq: Once | INTRAMUSCULAR | Status: AC | PRN
  Administered 2019-04-27: 4 mg via INTRAVENOUS

## 2019-04-27 MED ORDER — FENTANYL CITRATE (PF) 100 MCG/2ML IJ SOLN
25.0000 ug | INTRAMUSCULAR | Status: DC | PRN
  Administered 2019-04-27 (×3): 25 ug via INTRAVENOUS

## 2019-04-27 MED ORDER — VISTASEAL 10 ML SINGLE DOSE KIT
PACK | CUTANEOUS | Status: DC | PRN
  Administered 2019-04-27: 10 mL via TOPICAL

## 2019-04-27 MED ORDER — DEXAMETHASONE SODIUM PHOSPHATE 10 MG/ML IJ SOLN
INTRAMUSCULAR | Status: DC | PRN
  Administered 2019-04-27: 10 mg via INTRAVENOUS

## 2019-04-27 MED ORDER — UMECLIDINIUM BROMIDE 62.5 MCG/INH IN AEPB
1.0000 | INHALATION_SPRAY | Freq: Every day | RESPIRATORY_TRACT | Status: DC
  Administered 2019-04-27 – 2019-04-28 (×2): 1 via RESPIRATORY_TRACT
  Filled 2019-04-27: qty 7

## 2019-04-27 MED ORDER — LIDOCAINE-EPINEPHRINE 1 %-1:100000 IJ SOLN
INTRAMUSCULAR | Status: AC
  Filled 2019-04-27: qty 1

## 2019-04-27 MED ORDER — LIDOCAINE HCL (CARDIAC) PF 100 MG/5ML IV SOSY
PREFILLED_SYRINGE | INTRAVENOUS | Status: DC | PRN
  Administered 2019-04-27: 80 mg via INTRAVENOUS

## 2019-04-27 MED ORDER — FENTANYL CITRATE (PF) 100 MCG/2ML IJ SOLN
INTRAMUSCULAR | Status: AC
  Filled 2019-04-27: qty 2

## 2019-04-27 MED ORDER — VISTASEAL 10 ML SINGLE DOSE KIT
PACK | CUTANEOUS | Status: AC
  Filled 2019-04-27: qty 10

## 2019-04-27 MED ORDER — MIDAZOLAM HCL 2 MG/2ML IJ SOLN
INTRAMUSCULAR | Status: AC
  Filled 2019-04-27: qty 2

## 2019-04-27 MED ORDER — PROPOFOL 10 MG/ML IV BOLUS
INTRAVENOUS | Status: DC | PRN
  Administered 2019-04-27: 200 mg via INTRAVENOUS

## 2019-04-27 MED ORDER — ACETAMINOPHEN 10 MG/ML IV SOLN
INTRAVENOUS | Status: AC
  Filled 2019-04-27: qty 100

## 2019-04-27 MED ORDER — PIPERACILLIN-TAZOBACTAM 3.375 G IVPB
INTRAVENOUS | Status: AC
  Administered 2019-04-27: 3.375 g via INTRAVENOUS
  Filled 2019-04-27: qty 50

## 2019-04-27 MED ORDER — ROCURONIUM BROMIDE 100 MG/10ML IV SOLN
INTRAVENOUS | Status: DC | PRN
  Administered 2019-04-27: 50 mg via INTRAVENOUS
  Administered 2019-04-27: 10 mg via INTRAVENOUS
  Administered 2019-04-27: 20 mg via INTRAVENOUS

## 2019-04-27 MED ORDER — ACETAMINOPHEN 10 MG/ML IV SOLN
INTRAVENOUS | Status: DC | PRN
  Administered 2019-04-27: 1000 mg via INTRAVENOUS

## 2019-04-27 MED ORDER — BUPIVACAINE HCL (PF) 0.25 % IJ SOLN
INTRAMUSCULAR | Status: AC
  Filled 2019-04-27: qty 30

## 2019-04-27 MED ORDER — FENTANYL CITRATE (PF) 100 MCG/2ML IJ SOLN
INTRAMUSCULAR | Status: DC | PRN
  Administered 2019-04-27: 50 ug via INTRAVENOUS
  Administered 2019-04-27 (×6): 25 ug via INTRAVENOUS

## 2019-04-27 MED ORDER — LIDOCAINE-EPINEPHRINE 1 %-1:100000 IJ SOLN
INTRAMUSCULAR | Status: DC | PRN
  Administered 2019-04-27: 10 mL via SUBCUTANEOUS

## 2019-04-27 MED ORDER — SUGAMMADEX SODIUM 200 MG/2ML IV SOLN
INTRAVENOUS | Status: DC | PRN
  Administered 2019-04-27: 300 mg via INTRAVENOUS

## 2019-04-27 MED ORDER — PHENYLEPHRINE HCL (PRESSORS) 10 MG/ML IV SOLN
INTRAVENOUS | Status: DC | PRN
  Administered 2019-04-27: 200 ug via INTRAVENOUS

## 2019-04-27 MED ORDER — SODIUM CHLORIDE 0.9 % IV SOLN
INTRAVENOUS | Status: DC | PRN
  Administered 2019-04-27: 250 mL via INTRAVENOUS

## 2019-04-27 MED ORDER — KETOROLAC TROMETHAMINE 30 MG/ML IJ SOLN
INTRAMUSCULAR | Status: DC | PRN
  Administered 2019-04-27: 30 mg via INTRAVENOUS

## 2019-04-27 MED ORDER — LACTATED RINGERS IV SOLN: INTRAVENOUS | Status: DC | PRN

## 2019-04-27 MED ORDER — FENTANYL CITRATE (PF) 100 MCG/2ML IJ SOLN
INTRAMUSCULAR | Status: AC
  Administered 2019-04-27: 25 ug via INTRAVENOUS
  Filled 2019-04-27: qty 2

## 2019-04-27 MED ORDER — SEVOFLURANE IN SOLN
RESPIRATORY_TRACT | Status: AC
  Filled 2019-04-27: qty 250

## 2019-04-27 MED ORDER — MIDAZOLAM HCL 2 MG/2ML IJ SOLN
INTRAMUSCULAR | Status: DC | PRN
  Administered 2019-04-27: 1 mg via INTRAVENOUS

## 2019-04-27 MED ORDER — ONDANSETRON HCL 4 MG/2ML IJ SOLN
INTRAMUSCULAR | Status: AC
  Filled 2019-04-27: qty 2

## 2019-04-27 SURGICAL SUPPLY — 55 items
APPLICATOR VISTASEAL 35 (MISCELLANEOUS) ×2 IMPLANT
APPLIER CLIP 5 13 M/L LIGAMAX5 (MISCELLANEOUS) ×4
BLADE SURG SZ11 CARB STEEL (BLADE) ×2 IMPLANT
CANISTER SUCT 1200ML W/VALVE (MISCELLANEOUS) ×2 IMPLANT
CATH CHOLANGI 4FR 420404F (CATHETERS) IMPLANT
CHLORAPREP W/TINT 26 (MISCELLANEOUS) ×2 IMPLANT
CLIP APPLIE 5 13 M/L LIGAMAX5 (MISCELLANEOUS) ×2 IMPLANT
COVER WAND RF STERILE (DRAPES) ×2 IMPLANT
DECANTER SPIKE VIAL GLASS SM (MISCELLANEOUS) IMPLANT
DEFOGGER SCOPE WARMER CLEARIFY (MISCELLANEOUS) ×2 IMPLANT
DERMABOND ADVANCED (GAUZE/BANDAGES/DRESSINGS) ×1
DERMABOND ADVANCED .7 DNX12 (GAUZE/BANDAGES/DRESSINGS) ×1 IMPLANT
DISSECTOR BLUNT CHERRY 10MM (MISCELLANEOUS) IMPLANT
ELECT CAUTERY BLADE TIP 2.5 (TIP) ×2
ELECT REM PT RETURN 9FT ADLT (ELECTROSURGICAL) ×2
ELECTRODE CAUTERY BLDE TIP 2.5 (TIP) ×1 IMPLANT
ELECTRODE REM PT RTRN 9FT ADLT (ELECTROSURGICAL) ×1 IMPLANT
ENDOLOOP SUT PDS II  0 18 (SUTURE) ×2
ENDOLOOP SUT PDS II 0 18 (SUTURE) ×2 IMPLANT
GLOVE BIO SURGEON STRL SZ 6.5 (GLOVE) ×2 IMPLANT
GLOVE INDICATOR 7.0 STRL GRN (GLOVE) ×2 IMPLANT
GOWN STRL REUS W/ TWL LRG LVL3 (GOWN DISPOSABLE) ×3 IMPLANT
GOWN STRL REUS W/TWL LRG LVL3 (GOWN DISPOSABLE) ×3
GRASPER SUT TROCAR 14GX15 (MISCELLANEOUS) IMPLANT
HEMOSTAT SURGICEL 2X3 (HEMOSTASIS) ×4 IMPLANT
IRRIGATION STRYKERFLOW (MISCELLANEOUS) ×1 IMPLANT
IRRIGATOR STRYKERFLOW (MISCELLANEOUS) ×2
IV CATH ANGIO 12GX3 LT BLUE (NEEDLE) IMPLANT
IV NS 1000ML (IV SOLUTION) ×1
IV NS 1000ML BAXH (IV SOLUTION) ×1 IMPLANT
KIT TURNOVER KIT A (KITS) ×2 IMPLANT
KITTNER LAPARASCOPIC 5X40 (MISCELLANEOUS) ×4 IMPLANT
LABEL OR SOLS (LABEL) IMPLANT
NEEDLE HYPO 22GX1.5 SAFETY (NEEDLE) ×2 IMPLANT
NS IRRIG 500ML POUR BTL (IV SOLUTION) ×2 IMPLANT
PACK LAP CHOLECYSTECTOMY (MISCELLANEOUS) ×2 IMPLANT
PENCIL ELECTRO HAND CTR (MISCELLANEOUS) ×2 IMPLANT
POUCH SPECIMEN RETRIEVAL 10MM (ENDOMECHANICALS) ×2 IMPLANT
SCISSORS METZENBAUM CVD 33 (INSTRUMENTS) ×2 IMPLANT
SET TUBE SMOKE EVAC HIGH FLOW (TUBING) ×2 IMPLANT
SLEEVE ADV FIXATION 5X100MM (TROCAR) ×4 IMPLANT
SOLUTION ELECTROLUBE (MISCELLANEOUS) IMPLANT
STRIP CLOSURE SKIN 1/2X4 (GAUZE/BANDAGES/DRESSINGS) ×2 IMPLANT
SUT ETHILON 3-0 KS 30 BLK (SUTURE) ×2 IMPLANT
SUT MNCRL 4-0 (SUTURE) ×1
SUT MNCRL 4-0 27XMFL (SUTURE) ×1
SUT VIC AB 3-0 SH 27 (SUTURE) ×1
SUT VIC AB 3-0 SH 27X BRD (SUTURE) ×1 IMPLANT
SUT VICRYL 0 AB UR-6 (SUTURE) ×4 IMPLANT
SUTURE MNCRL 4-0 27XMF (SUTURE) ×1 IMPLANT
SYS KII FIOS ACCESS ABD 5X100 (TROCAR) ×2
SYSTEM KII FIOS ACES ABD 5X100 (TROCAR) ×1 IMPLANT
TROCAR ADV FIXATION 12X100MM (TROCAR) ×2 IMPLANT
TROCAR BALLN GELPORT 12X130M (ENDOMECHANICALS) ×2 IMPLANT
WATER STERILE IRR 1000ML POUR (IV SOLUTION) ×2 IMPLANT

## 2019-04-27 NOTE — Op Note (Signed)
Laparoscopic Cholecystectomy  Pre-operative Diagnosis: Acute cholecystitis  Post-operative Diagnosis: Severe acute cholecystitis with massive inflammatory response  Procedure: Laparoscopic cholecystectomy, add modifier 22 for extreme difficulty of case  Surgeon: Fredirick Maudlin, MD  Anesthesia: GETA  Assistant: None   Findings: There was a profound inflammatory response within the abdomen that caused extensive scarring as well as formation of a thick rind.  It took over an hour and a half to even identify the gallbladder.  There was pus aspirated from the gallbladder which was sent for culture.  The cystic duct avulsed near the insertion at the common bile duct and therefore an Endoloop was required to close the hole.  All of these findings are consistent with severe acute on chronic cholecystitis.   Estimated Blood Loss: 40 cc         Drains: #19 Blake drain left in the gallbladder fossa         Specimens: Gallbladder           Complications: none immediately apparent   Procedure Details  The patient was seen again in the preoperative holding area. The benefits, complications, treatment options, and expected outcomes were discussed with the patient. The risks of bleeding, infection, recurrence of symptoms, failure to resolve symptoms, bile duct damage, bile duct leak, retained common bile duct stone, bowel injury, any of which could require further surgery and/or ERCP, stent, or papillotomy were reviewed with the patient. The likelihood of improving the patient's symptoms with return to their baseline status is good.  The patient and/or family concurred with the proposed plan, giving informed consent.  The patient was taken to operating room, identified as Vanessa Baker and the procedure verified as Laparoscopic Cholecystectomy. A time out was performed and the above information confirmed.  Prior to the induction of general anesthesia, antibiotic prophylaxis was administered.  VTE prophylaxis was in place. General endotracheal anesthesia was then administered and tolerated well. After the induction, the abdomen was prepped with Chloraprep and draped in the sterile fashion. The patient was positioned in the supine position.  Although I typically entered the abdomen using Optiview technique, palpation of the subcostal area demonstrated a mass or structure that seemed likely to be the liver.  Therefore cut down technique was used to enter the abdominal cavity and a Hasson trochar was placed after two vicryl stitches were anchored to the fascia. Pneumoperitoneum was then created with CO2 and tolerated well without any adverse changes in the patient's vital signs.  Three 5-mm ports were placed in the right upper quadrant all under direct vision. All skin incisions  were infiltrated with a local anesthetic agent before making the incision and placing the trocars.   The patient was positioned  in reverse Trendelenburg, tilted slightly to the patient's left.  There was a huge inflammatory response that involved the omentum, the duodenum, the transverse colon, and small bowel.  Over an hour and a half of adhesiolysis, blunt dissection and judicious electrocautery was required to expose the gallbladder.  Once the gallbladder was identified, we were unable to grasp the fundus, secondary to profound distention.  A trocar aspirator was introduced.  Approximately 20 cc of clear fluid were aspirated followed by another 10 cc of pus.  The pus was sent for culture.  The fundus grasped and retracted cephalad.  At that point, we were still unable to identify the infundibulum.  I continued carefully teasing the inflamed tissues and thick inflammatory rind away from the gallbladder and until I was able  to identify the infundibulum.  It was grasped and retracted laterally, exposing the peritoneum overlying the triangle of Calot.  This tissue was extremely thick and fibrous.  This was then divided and  exposed in a blunt fashion.  There were several small vascular structures that were clipped and divided, nonetheless all of the tissues were so inflamed that there was significant oozing from every manipulated surface.  Ultimately, I was able to obtain an extended critical view of the cystic duct and cystic artery. The cystic duct was clearly identified and bluntly dissected free.  During this process, a small hole was made in the cystic duct which upon additional dissection and exposure, was noted to be adjacent to the entry into the common bile duct.  They were approximately 3 mm of tissue intervening between the hole in the common bile duct.  The cystic artery was double clipped and divided.  As I continue to work to expose and confirm my impression that the cystic duct was truly the cystic duct, and not the common bile duct, the cystic duct avulsed.  At that point, it seemed clear that I had not violated the common bile duct, as no additional bile flowed forth.  To secure the opening of the cystic duct, a PDS Endoloop was used and cinched down.  This provided adequate closure of the cystic duct.  The gallbladder was taken from the gallbladder fossa in a retrograde fashion with the electrocautery. The gallbladder was removed and placed in an Endo pouch bag. The liver bed was irrigated and inspected. Hemostasis was achieved with the electrocautery. Copious saline irrigation was utilized and was repeatedly aspirated until clear.  Due to the extensive raw surface oozing, Vistaseal was applied to all the raw surfaces.  Once the Vistaseal had congealed, a #19 Blake drain was placed in the gallbladder fossa and brought out through one of the lateral port sites.  The gallbladder and Endo pouch sac were then removed through a port site.   Inspection of the right upper quadrant was performed. No bleeding, bile duct injury or leak, or bowel injury was noted. Pneumoperitoneum was released.  The periumbilical port site  was closed with interrumpted 0 Vicryl sutures. 4-0 subcuticular Monocryl was used to close the skin. Dermabond was applied.  The patient was then extubated and brought to the recovery room in stable condition. Sponge, lap, and needle counts were correct at closure and at the conclusion of the case.               Fredirick Maudlin, MD, FACS

## 2019-04-27 NOTE — Care Management Obs Status (Signed)
[place] NOTIFICATION   Patient Details  Name: Vanessa Baker MRN: [id number] Date of Birth: 1976/01/07   Medicare Observation Status Notification Given:  No(off the floor to [place])    Beverly Sessions, RN 04/27/2019, 1:33 PM

## 2019-04-27 NOTE — Anesthesia Procedure Notes (Signed)
Procedure Name: Intubation Date/Time: 04/27/2019 10:17 AM Performed by: Willette Alma, CRNA Pre-anesthesia Checklist: Patient identified, Patient being monitored, Timeout performed, Emergency Drugs available and Suction available Patient Re-evaluated:Patient Re-evaluated prior to induction Oxygen Delivery Method: Circle system utilized Preoxygenation: Pre-oxygenation with 100% oxygen Induction Type: IV induction Ventilation: Mask ventilation without difficulty Laryngoscope Size: Mac and 4 Grade View: Grade I Tube type: Oral Tube size: 7.0 mm Number of attempts: 1 Airway Equipment and Method: Stylet Placement Confirmation: ETT inserted through vocal cords under direct vision,  positive ETCO2 and breath sounds checked- equal and bilateral Secured at: 21 cm Tube secured with: Tape Dental Injury: Teeth and Oropharynx as per pre-operative assessment

## 2019-04-27 NOTE — H&P (Signed)
Vanessa Baker is an 44 y.o. female.   Chief Complaint: Abdominal pain HPI: She has a history of irritable bowel syndrome and felt like she was experiencing a flare last Monday (1 week ago).  It did not improve and the pain became worse over the weekend.  She decided to come to the emergency department for further evaluation and treatment.  Laboratory studies were significant for a leukocytosis at 17.8.  A CT scan was performed that demonstrated findings consistent with acute cholecystitis.  General surgery was consulted for further evaluation and management.  She reports that she often has abdominal pain, but that it seems to be primarily related to her constipation-predominant irritable bowel syndrome.  She denies any nausea or vomiting.  She reports a subjective fever as well as some chills.  No diarrhea.  She describes the pain is diffuse throughout her abdomen, but worse in the epigastrium.  She denies any jaundice, dark Coca-Cola colored urine, or acholic stools.  Past Medical History:  Diagnosis Date  . Allergy   . Anxiety   . Asthma   . Bipolar disorder ([place])   . Bursitis   . Depression   . Diabetes mellitus without complication ([place])   . Essential hypertension 03/28/2017  . Frequent headaches   . GERD (gastroesophageal reflux disease)   . Headache   . High cholesterol   . Hypertension   . Irritable bowel syndrome (IBS)   . Irritable colon 02/23/2010  . Migraine 04/02/2017  . Obesity (BMI 30-39.9) 11/25/2013  . Seizure ([place]) [id number]  . Seizures ([place])   . Tremor     Past Surgical History:  Procedure Laterality Date  . NO PAST SURGERIES      Family History  Problem Relation Age of Onset  . Bipolar disorder Mother   . Schizophrenia Mother   . Hypertension Mother   . Diabetes Mother   . Cancer Mother   . Anxiety disorder Mother   . ADD / ADHD Mother   . Alcohol abuse Mother   . Drug abuse Mother   . Bipolar disorder Sister   . Anxiety disorder Sister   . Drug  abuse Sister   . Bipolar disorder Brother   . Anxiety disorder Brother   . Drug abuse Brother   . Hypertension Father   . Cancer Father   . Breast cancer Maternal Aunt   . Breast cancer Paternal Aunt   . Breast cancer Maternal Grandmother   . Alcohol abuse Maternal Grandmother   . Breast cancer Paternal Grandmother   . Parkinson's disease Maternal Grandfather   . Alcohol abuse Maternal Grandfather   . Alcohol abuse Maternal Uncle   . ADD / ADHD Son   . Tremor Neg Hx    Social History:  reports that she has been smoking cigarettes. She has a 13.50 pack-year smoking history. She has never used smokeless tobacco. She reports previous alcohol use. She reports that she does not use drugs.  Allergies:  Allergies  Allergen Reactions  . Amitriptyline Other (See Comments)    Confusion, paralysis    Medications Prior to Admission  Medication Sig Dispense Refill  . ACCU-CHEK AVIVA PLUS test strip USE 1 STRIP TO CHECK GLUCOSE UP TO 3 TIMES DAILY AS DIRECTED. 300 each 1  . Accu-Chek Softclix Lancets lancets Use to check blood sugar up to 3 times daily as instructed 300 each 1  . acetaminophen (TYLENOL) 500 MG tablet Take 500 mg by mouth every 6 (six) hours as needed.    Marland Kitchen  albuterol (PROAIR HFA) 108 (90 Base) MCG/ACT inhaler Inhale 2 puffs into the lungs every 4 (four) hours as needed for wheezing or shortness of breath. 8 g 5  . atorvastatin (LIPITOR) 40 MG tablet Take 1 tablet (40 mg total) by mouth daily. 90 tablet 1  . azithromycin (ZITHROMAX) 500 MG tablet Take 1 tablet (500 mg total) by mouth daily. 3 tablet 0  . baclofen (LIORESAL) 10 MG tablet Take 1 tablet (10 mg total) by mouth 3 (three) times daily as needed for muscle spasms. 90 tablet 0  . benztropine (COGENTIN) 1 MG tablet Take 1 tablet (1 mg total) by mouth 3 (three) times daily. 270 tablet 0  . blood glucose meter kit and supplies Dispense based on patient and insurance preference. Use up to four times daily as directed. (FOR  ICD-10 E10.9, E11.9). 1 each 0  . Blood Glucose Monitoring Suppl (ACCU-CHEK AVIVA PLUS) w/Device KIT Use to check blood sugar up to 3 times daily 1 kit 0  . budesonide-formoterol (SYMBICORT) 160-4.5 MCG/ACT inhaler Inhale into the lungs.    . Calcium Carb-Cholecalciferol (CALCIUM 600+D3 PO) Take 1 tablet by mouth daily.    . Dulaglutide (TRULICITY) 1.5 HA/1.9FX SOPN Inject 1.5 mg into the skin every 7 (seven) days. 4 pen 12  . esomeprazole (NEXIUM) 20 MG capsule Take 1 capsule (20 mg total) by mouth daily at 12 noon. 90 capsule 3  . Fluticasone-Umeclidin-Vilant (TRELEGY ELLIPTA) 100-62.5-25 MCG/INH AEPB Inhale 1 puff into the lungs daily. 30 each 5  . gabapentin (NEURONTIN) 300 MG capsule Take 1 capsule (300 mg total) by mouth 2 (two) times daily. For restless legs 180 capsule 1  . ibuprofen (ADVIL,MOTRIN) 200 MG tablet Take 400-800 mg by mouth every 6 (six) hours as needed (for pain or headaches).    . linaclotide (LINZESS) 145 MCG CAPS capsule Take 1 capsule (145 mcg total) by mouth daily before breakfast. 30 capsule 2  . lisinopril (ZESTRIL) 10 MG tablet Take 1 tablet (10 mg total) by mouth daily. 90 tablet 0  . lithium carbonate 300 MG capsule Take 1 capsule (300 mg total) by mouth 2 (two) times daily with a meal. 180 capsule 0  . metFORMIN (GLUCOPHAGE) 500 MG tablet Take 500 mg by mouth 2 (two) times daily.    . metFORMIN (GLUCOPHAGE) 500 MG tablet Take by mouth.    . Multiple Vitamins-Minerals (MULTIVITAMIN ADULT EXTRA C) CHEW Chew by mouth.    . Na Sulfate-K Sulfate-Mg Sulf (SUPREP BOWEL PREP KIT) 17.5-3.13-1.6 GM/177ML SOLN Take 1 kit by mouth as directed. 354 mL 0  . norethindrone-ethinyl estradiol (JUNEL FE 1/20) 1-20 MG-MCG tablet Take 1 tablet by mouth daily. 84 tablet 0  . OLANZapine (ZYPREXA) 15 MG tablet Take 1 tablet (15 mg total) by mouth at bedtime. 90 tablet 0  . omega-3 acid ethyl esters (LOVAZA) 1 g capsule Take 1 capsule by mouth twice daily 180 capsule 1  . Omega-3 Fatty  Acids (RA FISH OIL) 1000 MG CAPS Take by mouth.    . primidone (MYSOLINE) 50 MG tablet Take 1 tablet (50 mg total) by mouth at bedtime. 90 tablet 1  . sertraline (ZOLOFT) 100 MG tablet Take 1 tablet (100 mg total) by mouth daily. 90 tablet 0  . traZODone (DESYREL) 100 MG tablet Take 1.5 tablets (150 mg total) by mouth at bedtime. For sleep 135 tablet 0    Results for orders placed or performed during the hospital encounter of 04/26/19 (from the past 48 hour(s))  Urinalysis,  Complete w Microscopic     Status: Abnormal   Collection Time: 04/26/19  5:20 PM  Result Value Ref Range   Color, Urine YELLOW (A) YELLOW   APPearance CLEAR (A) CLEAR   Specific Gravity, Urine 1.006 1.005 - 1.030   pH 7.0 5.0 - 8.0   Glucose, UA 50 (A) NEGATIVE mg/dL   Hgb urine dipstick NEGATIVE NEGATIVE   Bilirubin Urine NEGATIVE NEGATIVE   Ketones, ur NEGATIVE NEGATIVE mg/dL   Protein, ur NEGATIVE NEGATIVE mg/dL   Nitrite NEGATIVE NEGATIVE   Leukocytes,Ua NEGATIVE NEGATIVE   RBC / HPF 0-5 0 - 5 RBC/hpf   WBC, UA 0-5 0 - 5 WBC/hpf   Bacteria, UA RARE (A) NONE SEEN   Squamous Epithelial / LPF 0-5 0 - 5    Comment: Performed at [facility], [address] [place], [place] [address]  Comprehensive metabolic panel     Status: Abnormal   Collection Time: 04/26/19  5:24 PM  Result Value Ref Range   Sodium 130 (L) 135 - 145 mmol/L   Potassium 3.9 3.5 - 5.1 mmol/L   Chloride 96 (L) 98 - 111 mmol/L   CO2 23 22 - 32 mmol/L   Glucose, Bld 269 (H) 70 - 99 mg/dL    Comment: Glucose reference range applies only to samples taken after fasting for at least 8 hours.   BUN 5 (L) 6 - 20 mg/dL   Creatinine, Ser 0.81 0.44 - 1.00 mg/dL   Calcium 8.9 8.9 - 10.3 mg/dL   Total Protein 7.7 6.5 - 8.1 g/dL   Albumin 3.4 (L) 3.5 - 5.0 g/dL   AST 19 15 - 41 U/L   ALT 21 0 - 44 U/L   Alkaline Phosphatase 120 38 - 126 U/L   Total Bilirubin 0.6 0.3 - 1.2 mg/dL   GFR calc non Af Amer >60 >60 mL/min   GFR calc Af Amer >60  >60 mL/min   Anion gap 11 5 - 15    Comment: Performed at [facility], [address] [place], [place] [address]  Lactic acid, plasma     Status: None   Collection Time: 04/26/19  5:24 PM  Result Value Ref Range   Lactic Acid, Venous 1.2 0.5 - 1.9 mmol/L    Comment: Performed at [facility], [address] [place],  [address]  CBC with Differential     Status: Abnormal   Collection Time: 04/26/19  5:24 PM  Result Value Ref Range   WBC 17.8 (H) 4.0 - 10.5 K/uL   RBC 4.68 3.87 - 5.11 MIL/uL   Hemoglobin 13.7 12.0 - 15.0 g/dL   HCT 43.0 36.0 - 46.0 %   MCV 91.9 80.0 - 100.0 fL   MCH 29.3 26.0 - 34.0 pg   MCHC 31.9 30.0 - 36.0 g/dL   RDW 13.1 11.5 - 15.5 %   Platelets 356 150 - 400 K/uL   nRBC 0.0 0.0 - 0.2 %   Neutrophils Relative % 81 %   Neutro Abs 14.3 (H) 1.7 - 7.7 K/uL   Lymphocytes Relative 10 %   Lymphs Abs 1.7 0.7 - 4.0 K/uL   Monocytes Relative 6 %   Monocytes Absolute 1.1 (H) 0.1 - 1.0 K/uL   Eosinophils Relative 1 %   Eosinophils Absolute 0.2 0.0 - 0.5 K/uL   Basophils Relative 1 %   Basophils Absolute 0.2 (H) 0.0 - 0.1 K/uL   Immature Granulocytes 1 %   Abs Immature Granulocytes  0.24 (H) 0.00 - 0.07 K/uL    Comment: Performed at [facility], [address] [place], [place] [address]  Protime-INR     Status: None   Collection Time: 04/26/19  5:24 PM  Result Value Ref Range   Prothrombin Time 13.1 11.4 - 15.2 seconds   INR 1.0 0.8 - 1.2    Comment: (NOTE) INR goal varies based on device and disease states. Performed at [facility], [address] [place], [place] [address]   Lipase, blood     Status: None   Collection Time: 04/26/19  6:25 PM  Result Value Ref Range   Lipase 26 11 - 51 U/L    Comment: Performed at [facility], [address] [place], [place] [address]  Valproic acid level     Status: Abnormal   Collection Time: 04/26/19  6:26 PM  Result Value Ref Range   Valproic Acid Lvl 103  (H) 50.0 - 100.0 ug/mL    Comment: Performed at [facility], [place], [place] [address]  POC SARS Coronavirus 2 Ag     Status: None   Collection Time: 04/26/19  7:12 PM  Result Value Ref Range   SARS Coronavirus 2 Ag NEGATIVE NEGATIVE    Comment: (NOTE) SARS-CoV-2 antigen NOT DETECTED.  Negative results are presumptive.  Negative results do not preclude SARS-CoV-2 infection and should not be used as the sole basis for treatment or other patient management decisions, including infection  control decisions, particularly in the presence of clinical signs and  symptoms consistent with COVID-19, or in those who have been in contact with the virus.  Negative results must be combined with clinical observations, patient history, and epidemiological information. The expected result is Negative. Fact Sheet for Patients: PodPark.tn Fact Sheet for Healthcare Providers: GiftContent.is This test is not yet approved or cleared by the [place] FDA and  has been authorized for detection and/or diagnosis of SARS-CoV-2 by FDA under an Emergency Use Authorization (EUA).  This EUA will remain in effect (meaning this test can be used) for the duration of  the COVID-19 de claration under Section 564(b)(1) of the Act, 21 U.S.C. section 360bbb-3(b)(1), unless the authorization is terminated or revoked sooner.   Lactic acid, plasma     Status: None   Collection Time: 04/26/19  8:44 PM  Result Value Ref Range   Lactic Acid, Venous 0.9 0.5 - 1.9 mmol/L    Comment: Performed at [facility], [address] [place], [place] [address]  Glucose, capillary     Status: Abnormal   Collection Time: 04/26/19 11:04 PM  Result Value Ref Range   Glucose-Capillary 148 (H) 70 - 99 mg/dL    Comment: Glucose reference range applies only to samples taken after fasting for at least 8 hours.  Glucose, capillary     Status:  Abnormal   Collection Time: 04/27/19  4:32 AM  Result Value Ref Range   Glucose-Capillary 136 (H) 70 - 99 mg/dL    Comment: Glucose reference range applies only to samples taken after fasting for at least 8 hours.  CBC     Status: Abnormal   Collection Time: 04/27/19  6:14 AM  Result Value Ref Range   WBC 12.0 (H) 4.0 - 10.5 K/uL   RBC 4.15 3.87 - 5.11 MIL/uL   Hemoglobin 12.3 12.0 - 15.0 g/dL   HCT 38.1 36.0 - 46.0 %   MCV 91.8 80.0 - 100.0 fL   MCH  29.6 26.0 - 34.0 pg   MCHC 32.3 30.0 - 36.0 g/dL   RDW 13.3 11.5 - 15.5 %   Platelets 312 150 - 400 K/uL   nRBC 0.0 0.0 - 0.2 %    Comment: Performed at [facility], [address] [place], [place] [address]   CT ABDOMEN PELVIS W CONTRAST  Result Date: 04/26/2019 CLINICAL DATA:  Abdominal pain, distention, vomiting EXAM: CT ABDOMEN AND PELVIS WITH CONTRAST TECHNIQUE: Multidetector CT imaging of the abdomen and pelvis was performed using the standard protocol following bolus administration of intravenous contrast. CONTRAST:  174m OMNIPAQUE IOHEXOL 300 MG/ML  SOLN COMPARISON:  05/28/2017 FINDINGS: Lower chest: Lung bases are clear. No effusions. Heart is normal size. Hepatobiliary: Diffuse fatty infiltration of the liver. No focal hepatic abnormality. Marked irregular gallbladder wall thickening. Stones are seen within the gallbladder. No biliary ductal dilatation. Surrounding inflammation. Findings compatible with acute cholecystitis. Pancreas: No focal abnormality or ductal dilatation. Spleen: No focal abnormality.  Normal size. Adrenals/Urinary Tract: No adrenal abnormality. No focal renal abnormality. No stones or hydronephrosis. Urinary bladder is unremarkable. Stomach/Bowel: Normal appendix. Stomach, large and small bowel grossly unremarkable. Vascular/Lymphatic: No evidence of aneurysm or adenopathy. Reproductive: Uterus and adnexa unremarkable.  No mass. Other: No free fluid or free air. Musculoskeletal: No acute bony  abnormality. IMPRESSION: Cholelithiasis. Marked irregular gallbladder wall thickening with surrounding inflammation. Findings compatible with acute cholecystitis. Fatty infiltration of the liver. Electronically Signed   By: KRolm BaptiseM.D.   On: 04/26/2019 20:28   DG Chest Port 1 View  Result Date: 04/26/2019 CLINICAL DATA:  Fever and body aches. EXAM: PORTABLE CHEST 1 VIEW COMPARISON:  Chest radiograph 03/31/2018 FINDINGS: The heart size and mediastinal contours are within normal limits. Chronic coarse bilateral interstitial markings. No new focal consolidation. No pneumothorax or significant pleural effusion. No acute finding in the visualized skeleton. IMPRESSION: No acute cardiopulmonary abnormality.  Chronic bronchitic changes. Electronically Signed   By: NAudie PintoM.D.   On: 04/26/2019 18:30    Review of Systems  All other systems reviewed and are negative.    Blood pressure (!) 111/50, pulse 74, temperature 98.1 F (36.7 C), temperature source Oral, resp. rate 16, height '5\' 2"'$  (1.575 m), weight 81.2 kg, last menstrual period 11/26/2018, SpO2 98 %. Physical Exam  Constitutional: She is oriented to person, place, and time. She appears well-developed and well-nourished.  She is obese  HENT:  Head: Normocephalic and atraumatic.  Mouth/Throat: No oropharyngeal exudate.  Eyes: Right eye exhibits no discharge. Left eye exhibits no discharge. No scleral icterus.  Neck: No tracheal deviation present.  Cardiovascular: Normal rate and regular rhythm.  Respiratory: Effort normal. No stridor. No respiratory distress.  GI: Soft. Bowel sounds are normal.  Genitourinary:    Genitourinary Comments: Deferred   Musculoskeletal:        General: No tenderness or edema.  Neurological: She is alert and oriented to person, place, and time.  Skin: Skin is warm and dry.  Psychiatric: She has a normal mood and affect. Her behavior is normal.     Assessment/Plan This is a 44year old woman  with abdominal pain and imaging findings most consistent with acute cholecystitis.  I have recommended that she undergo laparoscopic cholecystectomy. I discussed the procedure in detail.  We discussed the risks and benefits of a laparoscopic cholecystectomy and possible cholangiogram including, but not limited to: bleeding, infection, injury to surrounding structures such as the intestine or liver, bile leak, retained gallstones, need to convert  to an open procedure, prolonged diarrhea, blood clots such as DVT, common bile duct injury, anesthesia risks, and possible need for additional procedures. The patient had the opportunity to ask any questions and these were answered to her satisfaction. We will proceed to the operating room pending the OR and anesthesia availability.  Fredirick Maudlin, MD 04/27/2019, 7:47 AM

## 2019-04-27 NOTE — Anesthesia Postprocedure Evaluation (Signed)
Anesthesia Post Note  Patient: Vanessa Baker  Procedure(s) Performed: LAPAROSCOPIC CHOLECYSTECTOMY (N/A )  Patient location during evaluation: PACU Anesthesia Type: General Level of consciousness: awake and alert Pain management: pain level controlled Vital Signs Assessment: post-procedure vital signs reviewed and stable Respiratory status: spontaneous breathing and respiratory function stable Cardiovascular status: stable Anesthetic complications: no     Last Vitals:  Vitals:   04/27/19 0938 04/27/19 1504  BP: 106/67 (!) 132/53  Pulse: 82 (!) 112  Resp: 16 19  Temp: (!) 35.9 C 37.2 C  SpO2: 97% 96%    Last Pain:  Vitals:   04/27/19 0938  TempSrc: Tympanic  PainSc: 0-No pain                 Britney Captain K

## 2019-04-27 NOTE — Anesthesia Preprocedure Evaluation (Signed)
Anesthesia Evaluation  Patient identified by MRN, date of birth, ID band Patient awake    Reviewed: Allergy & Precautions, NPO status , Patient's Chart, lab work & pertinent test results  History of Anesthesia Complications Negative for: history of anesthetic complications  Airway Mallampati: II       Dental   Pulmonary asthma , neg sleep apnea, neg COPD, Current Smoker,           Cardiovascular hypertension, Pt. on medications (-) Past MI and (-) CHF (-) dysrhythmias (-) Valvular Problems/Murmurs     Neuro/Psych Seizures -, Well Controlled,  Anxiety Depression Bipolar Disorder    GI/Hepatic Neg liver ROS, GERD  Medicated and Controlled,  Endo/Other  diabetes, Type 2, Oral Hypoglycemic Agents  Renal/GU negative Renal ROS     Musculoskeletal   Abdominal   Peds  Hematology   Anesthesia Other Findings   Reproductive/Obstetrics                             Anesthesia Physical Anesthesia Plan  ASA: III  Anesthesia Plan: General   Post-op Pain Management:    Induction: Intravenous  PONV Risk Score and Plan: 2 and Ondansetron and Midazolam  Airway Management Planned: Oral ETT  Additional Equipment:   Intra-op Plan:   Post-operative Plan:   Informed Consent: I have reviewed the patients History and Physical, chart, labs and discussed the procedure including the risks, benefits and alternatives for the proposed anesthesia with the patient or authorized representative who has indicated his/her understanding and acceptance.       Plan Discussed with:   Anesthesia Plan Comments:         Anesthesia Quick Evaluation

## 2019-04-27 NOTE — Transfer of Care (Signed)
Immediate Anesthesia Transfer of Care Note  Patient: Vanessa Baker  Procedure(s) Performed: LAPAROSCOPIC CHOLECYSTECTOMY (N/A )  Patient Location: PACU  Anesthesia Type:General  Level of Consciousness: awake, alert  and oriented  Airway & Oxygen Therapy: Patient Spontanous Breathing and Patient connected to face mask oxygen  Post-op Assessment: Report given to RN and Post -op Vital signs reviewed and stable  Post vital signs: Reviewed and stable  Last Vitals:  Vitals Value Taken Time  BP 132/53 04/27/19 1504  Temp 37.2 C 04/27/19 1504  Pulse 108 04/27/19 1506  Resp 21 04/27/19 1506  SpO2 97 % 04/27/19 1506  Vitals shown include unvalidated device data.  Last Pain:  Vitals:   04/27/19 0938  TempSrc: Tympanic  PainSc: 0-No pain      Patients Stated Pain Goal: 0 (75/44/92 0100)  Complications: No apparent anesthesia complications

## 2019-04-28 ENCOUNTER — Observation Stay: Payer: Medicare HMO

## 2019-04-28 ENCOUNTER — Ambulatory Visit: Payer: Medicare HMO | Admitting: Gastroenterology

## 2019-04-28 ENCOUNTER — Inpatient Hospital Stay: Payer: Medicare HMO

## 2019-04-28 DIAGNOSIS — I248 Other forms of acute ischemic heart disease: Secondary | ICD-10-CM | POA: Diagnosis present

## 2019-04-28 DIAGNOSIS — E119 Type 2 diabetes mellitus without complications: Secondary | ICD-10-CM | POA: Diagnosis not present

## 2019-04-28 DIAGNOSIS — Z8249 Family history of ischemic heart disease and other diseases of the circulatory system: Secondary | ICD-10-CM | POA: Diagnosis not present

## 2019-04-28 DIAGNOSIS — J810 Acute pulmonary edema: Secondary | ICD-10-CM | POA: Diagnosis present

## 2019-04-28 DIAGNOSIS — Z23 Encounter for immunization: Secondary | ICD-10-CM | POA: Diagnosis not present

## 2019-04-28 DIAGNOSIS — K581 Irritable bowel syndrome with constipation: Secondary | ICD-10-CM | POA: Diagnosis present

## 2019-04-28 DIAGNOSIS — Z888 Allergy status to other drugs, medicaments and biological substances status: Secondary | ICD-10-CM | POA: Diagnosis not present

## 2019-04-28 DIAGNOSIS — E7800 Pure hypercholesterolemia, unspecified: Secondary | ICD-10-CM | POA: Diagnosis present

## 2019-04-28 DIAGNOSIS — Z833 Family history of diabetes mellitus: Secondary | ICD-10-CM | POA: Diagnosis not present

## 2019-04-28 DIAGNOSIS — I10 Essential (primary) hypertension: Secondary | ICD-10-CM | POA: Diagnosis present

## 2019-04-28 DIAGNOSIS — J441 Chronic obstructive pulmonary disease with (acute) exacerbation: Secondary | ICD-10-CM | POA: Diagnosis not present

## 2019-04-28 DIAGNOSIS — E669 Obesity, unspecified: Secondary | ICD-10-CM | POA: Diagnosis present

## 2019-04-28 DIAGNOSIS — Z20822 Contact with and (suspected) exposure to covid-19: Secondary | ICD-10-CM | POA: Diagnosis present

## 2019-04-28 DIAGNOSIS — R918 Other nonspecific abnormal finding of lung field: Secondary | ICD-10-CM | POA: Diagnosis not present

## 2019-04-28 DIAGNOSIS — J80 Acute respiratory distress syndrome: Secondary | ICD-10-CM | POA: Diagnosis not present

## 2019-04-28 DIAGNOSIS — K819 Cholecystitis, unspecified: Secondary | ICD-10-CM | POA: Diagnosis present

## 2019-04-28 DIAGNOSIS — Z9049 Acquired absence of other specified parts of digestive tract: Secondary | ICD-10-CM | POA: Diagnosis not present

## 2019-04-28 DIAGNOSIS — J4541 Moderate persistent asthma with (acute) exacerbation: Secondary | ICD-10-CM | POA: Diagnosis not present

## 2019-04-28 DIAGNOSIS — J440 Chronic obstructive pulmonary disease with acute lower respiratory infection: Secondary | ICD-10-CM | POA: Diagnosis not present

## 2019-04-28 DIAGNOSIS — R778 Other specified abnormalities of plasma proteins: Secondary | ICD-10-CM | POA: Diagnosis not present

## 2019-04-28 DIAGNOSIS — Z818 Family history of other mental and behavioral disorders: Secondary | ICD-10-CM | POA: Diagnosis not present

## 2019-04-28 DIAGNOSIS — G43909 Migraine, unspecified, not intractable, without status migrainosus: Secondary | ICD-10-CM | POA: Diagnosis present

## 2019-04-28 DIAGNOSIS — R140 Abdominal distension (gaseous): Secondary | ICD-10-CM | POA: Diagnosis not present

## 2019-04-28 DIAGNOSIS — R509 Fever, unspecified: Secondary | ICD-10-CM | POA: Diagnosis not present

## 2019-04-28 DIAGNOSIS — R569 Unspecified convulsions: Secondary | ICD-10-CM | POA: Diagnosis present

## 2019-04-28 DIAGNOSIS — K219 Gastro-esophageal reflux disease without esophagitis: Secondary | ICD-10-CM | POA: Diagnosis present

## 2019-04-28 DIAGNOSIS — R0602 Shortness of breath: Secondary | ICD-10-CM | POA: Diagnosis not present

## 2019-04-28 DIAGNOSIS — J4540 Moderate persistent asthma, uncomplicated: Secondary | ICD-10-CM | POA: Diagnosis not present

## 2019-04-28 DIAGNOSIS — F419 Anxiety disorder, unspecified: Secondary | ICD-10-CM | POA: Diagnosis present

## 2019-04-28 DIAGNOSIS — J9601 Acute respiratory failure with hypoxia: Secondary | ICD-10-CM | POA: Diagnosis not present

## 2019-04-28 DIAGNOSIS — Z720 Tobacco use: Secondary | ICD-10-CM | POA: Diagnosis not present

## 2019-04-28 DIAGNOSIS — R69 Illness, unspecified: Secondary | ICD-10-CM | POA: Diagnosis not present

## 2019-04-28 DIAGNOSIS — F319 Bipolar disorder, unspecified: Secondary | ICD-10-CM | POA: Diagnosis present

## 2019-04-28 DIAGNOSIS — K812 Acute cholecystitis with chronic cholecystitis: Secondary | ICD-10-CM | POA: Diagnosis not present

## 2019-04-28 DIAGNOSIS — R0689 Other abnormalities of breathing: Secondary | ICD-10-CM | POA: Diagnosis not present

## 2019-04-28 DIAGNOSIS — J189 Pneumonia, unspecified organism: Secondary | ICD-10-CM | POA: Diagnosis not present

## 2019-04-28 DIAGNOSIS — Z6833 Body mass index (BMI) 33.0-33.9, adult: Secondary | ICD-10-CM | POA: Diagnosis not present

## 2019-04-28 DIAGNOSIS — J9621 Acute and chronic respiratory failure with hypoxia: Secondary | ICD-10-CM | POA: Diagnosis not present

## 2019-04-28 LAB — COMPREHENSIVE METABOLIC PANEL
ALT: 24 U/L (ref 0–44)
AST: 40 U/L (ref 15–41)
Albumin: 2.5 g/dL — ABNORMAL LOW (ref 3.5–5.0)
Alkaline Phosphatase: 97 U/L (ref 38–126)
Anion gap: 7 (ref 5–15)
BUN: 5 mg/dL — ABNORMAL LOW (ref 6–20)
CO2: 25 mmol/L (ref 22–32)
Calcium: 8 mg/dL — ABNORMAL LOW (ref 8.9–10.3)
Chloride: 109 mmol/L (ref 98–111)
Creatinine, Ser: 0.68 mg/dL (ref 0.44–1.00)
GFR calc Af Amer: >60 mL/min (ref >60)
GFR calc non Af Amer: >60 mL/min (ref >60)
Glucose, Bld: 135 mg/dL — ABNORMAL HIGH (ref 70–99)
Potassium: 4 mmol/L (ref 3.5–5.1)
Sodium: 141 mmol/L (ref 135–145)
Total Bilirubin: 0.3 mg/dL (ref 0.3–1.2)
Total Protein: 6.1 g/dL — ABNORMAL LOW (ref 6.5–8.1)

## 2019-04-28 LAB — GLUCOSE, CAPILLARY
Glucose-Capillary: 111 mg/dL — ABNORMAL HIGH (ref 70–99)
Glucose-Capillary: 113 mg/dL — ABNORMAL HIGH (ref 70–99)
Glucose-Capillary: 126 mg/dL — ABNORMAL HIGH (ref 70–99)
Glucose-Capillary: 147 mg/dL — ABNORMAL HIGH (ref 70–99)
Glucose-Capillary: 153 mg/dL — ABNORMAL HIGH (ref 70–99)
Glucose-Capillary: 223 mg/dL — ABNORMAL HIGH (ref 70–99)

## 2019-04-28 LAB — CBC
HCT: 36.6 % (ref 36.0–46.0)
Hemoglobin: 11.7 g/dL — ABNORMAL LOW (ref 12.0–15.0)
MCH: 29.3 pg (ref 26.0–34.0)
MCHC: 32 g/dL (ref 30.0–36.0)
MCV: 91.5 fL (ref 80.0–100.0)
Platelets: 311 10*3/uL (ref 150–400)
RBC: 4 MIL/uL (ref 3.87–5.11)
RDW: 13.5 % (ref 11.5–15.5)
WBC: 12.5 10*3/uL — ABNORMAL HIGH (ref 4.0–10.5)
nRBC: 0 % (ref 0.0–0.2)

## 2019-04-28 LAB — MAGNESIUM: Magnesium: 2.3 mg/dL (ref 1.7–2.4)

## 2019-04-28 LAB — PHOSPHORUS: Phosphorus: 3.6 mg/dL (ref 2.5–4.6)

## 2019-04-28 MED ORDER — IOHEXOL 350 MG/ML SOLN
75.0000 mL | Freq: Once | INTRAVENOUS | Status: AC | PRN
  Administered 2019-04-28: 75 mL via INTRAVENOUS

## 2019-04-28 MED ORDER — HYDROMORPHONE HCL 1 MG/ML IJ SOLN
INTRAMUSCULAR | Status: AC
  Filled 2019-04-28: qty 1

## 2019-04-28 MED ORDER — FUROSEMIDE 10 MG/ML IJ SOLN
20.0000 mg | Freq: Once | INTRAMUSCULAR | Status: AC
  Administered 2019-04-28: 20 mg via INTRAVENOUS
  Filled 2019-04-28: qty 4

## 2019-04-28 MED ORDER — ENOXAPARIN SODIUM 40 MG/0.4ML ~~LOC~~ SOLN
40.0000 mg | SUBCUTANEOUS | Status: DC
  Administered 2019-04-28 – 2019-05-01 (×4): 40 mg via SUBCUTANEOUS
  Filled 2019-04-28 (×4): qty 0.4

## 2019-04-28 NOTE — Progress Notes (Signed)
[facility] Day(s): 0.   Post op day(s): 1 Day Post-Op.   Interval History:  Patient seen and examined no acute events or new complaints overnight.  Patient reports she is still having 8/10 upper abdominal pain, near her incisions, worse with deep inspiration. She is on [place] this morning for that reason.  No fever, chills, nausea, or emesis.  Mild leukocytosis to 12K, likely reactive, afebrile Bilirubin and LFTs are normal this morning Surgical drain with 70 ccs out, serosanguinous   Vital signs in last 24 hours: [min-max] current  Temp:  [97.8 F (36.6 C)-99.2 F (37.3 C)] 98.2 F (36.8 C) (03/23 0834) Pulse Rate:  [78-112] 95 (03/23 0834) Resp:  [15-24] 18 (03/23 0834) BP: (121-153)/(53-105) 121/71 (03/23 0834) SpO2:  [88 %-96 %] 93 % (03/23 0834)     Height: 5\' 2"  (157.5 cm) Weight: 81.2 kg BMI (Calculated): 32.73   Intake/Output last 2 shifts:  03/22 0701 - 03/23 0700 In: 2780 [P.O.:480; I.V.:2300] Out: 1440 [Urine:1350; Drains:70; Blood:20]   Physical Exam:  Constitutional: alert, cooperative and no distress  Respiratory: breathing non-labored at rest, on [place] Cardiovascular: regular rate and sinus rhythm  Gastrointestinal: Soft, incisional tenderness, and non-distended. No rebound/guarding, JP in RUQ with serosanguinous output Integumentary: Laparoscopic incisions are CDI with steri-strips, no erythema or drainage  Labs:  CBC Latest Ref Rng & Units 04/28/2019 04/27/2019 04/26/2019  WBC 4.0 - 10.5 K/uL 12.5(H) 12.0(H) 17.8(H)  Hemoglobin 12.0 - 15.0 g/dL 11.7(L) 12.3 13.7  Hematocrit 36.0 - 46.0 % 36.6 38.1 43.0  Platelets 150 - 400 K/uL 311 312 356   CMP Latest Ref Rng & Units 04/28/2019 04/27/2019 04/26/2019  Glucose 70 - 99 mg/dL 135(H) 118(H) 269(H)  BUN 6 - 20 mg/dL 5(L) 6 5(L)  Creatinine 0.44 - 1.00 mg/dL 0.68 0.62 0.81  Sodium 135 - 145 mmol/L 141 141 130(L)  Potassium 3.5 - 5.1 mmol/L 4.0 3.7 3.9  Chloride 98 -  111 mmol/L 109 107 96(L)  CO2 22 - 32 mmol/L 25 25 23   Calcium 8.9 - 10.3 mg/dL 8.0(L) 8.4(L) 8.9  Total Protein 6.5 - 8.1 g/dL 6.1(L) 6.3(L) 7.7  Total Bilirubin 0.3 - 1.2 mg/dL 0.3 0.4 0.6  Alkaline Phos 38 - 126 U/L 97 97 120  AST 15 - 41 U/L 40 19 19  ALT 0 - 44 U/L 24 16 21      Imaging studies: No new pertinent imaging studies   Assessment/Plan:  44 y.o. female overall doing well whose major issue is post-operative pain management 1 Day Post-Op s/p laparoscopic cholecystectomy for acute on chronic cholecystitis with severe intra-abdominal inflammatory response making surgery complicated.    - She appears to be doing well clinically with pain being her biggest limiting factor. Given the extensive nature of her surgery and inflammatory response I think it is reasonable to stay today for pain control and monitoring, which she is agreeable with. If her pain is significantly improved today we may be able to get her home.     - advance to regular diet  - continue IV Abx (Zosyn); she will need PO outpatient  - pain control prn (multimodal); antiemetics prn  - monitor abdominal examination  - trend labs; likely reactive   - mobilization encouraged  - wean to room air  - further management per primary service  All of the above findings and recommendations were discussed with the patient, patient's family (husband at bedside), and the medical team, and all of  patient's and family's questions were answered to their expressed satisfaction.  -- Edison Simon, PA-C [facility] 04/28/2019, 9:46 AM [phone] M-F: 7am - 4pm

## 2019-04-28 NOTE — Progress Notes (Signed)
Brief Progress Note I independently reviewed CXR (04/28/2019) which appears to have pulmonary congestion bilaterally, I did also review radiologist interpretation as follows "Diffuse bilateral pulmonary infiltrates." I do not suspect PNA although she is already on IV Abx (Zosyn). Her renal function is normal and she is making good urine. I suspect she may have some component of fluid overload. IVF slowed to [facility]. I will give her a 1 time dose of 20 mg Lasix and assess response.   D/W primary RN  -- Edison Simon, PA-C [facility] 04/28/2019, 1:14 PM [phone] M-F: 7am - 4pm

## 2019-04-28 NOTE — Progress Notes (Signed)
Inpatient Diabetes Program Recommendations  AACE/ADA: New Consensus Statement on Inpatient Glycemic Control (2015)  Target Ranges:  Prepandial:   less than 140 mg/dL      Peak postprandial:   less than 180 mg/dL (1-2 hours)      Critically ill patients:  140 - 180 mg/dL   Lab Results  Component Value Date   GLUCAP 111 (H) 04/28/2019   HGBA1C 6.1 (H) 04/27/2019    Review of Glycemic Control Results for Vanessa Baker, Vanessa Baker (MRN [id number]) as of 04/28/2019 10:37  Ref. Range 04/27/2019 09:26 04/27/2019 15:08 04/27/2019 16:47 04/27/2019 21:05 04/27/2019 23:44 04/28/2019 05:31 04/28/2019 08:00  Glucose-Capillary Latest Ref Range: 70 - 99 mg/dL 109 (H) 189 (H) 201 (H) 350 (H) 269 (H) 113 (H) 111 (H)   Diabetes history: DM 2 Outpatient Diabetes medications:  Trulicity 1.5 mg weekly, Metformin 500 mg bid Current orders for Inpatient glycemic control:  Novolog moderate q 4 hours Inpatient Diabetes Program Recommendations:    Note that blood sugars increased last PM.  Of note, patient did receive Decadron 10 mg X1 on 3/22.  Blood sugars improved today and were likely elevated on 3/22 from steroid.   Will follow.   Thanks  Adah Perl, RN, BC-ADM Inpatient Diabetes Coordinator Pager [phone] (8a-5p)

## 2019-04-28 NOTE — Care Management Obs Status (Signed)
[place] NOTIFICATION   Patient Details  Name: Vanessa Baker MRN: [id number] Date of Birth: Mar 04, 1975   Medicare Observation Status Notification Given:  Fernande Boyden, RN 04/28/2019, 11:09 AM

## 2019-04-28 NOTE — Progress Notes (Signed)
Initial Nutrition Assessment  DOCUMENTATION CODES:   Obesity unspecified  INTERVENTION:   RD will monitor oral intake and add supplements if needed  Carbohydrate modified/low fat diet   NUTRITION DIAGNOSIS:   Inadequate oral intake related to acute illness as evidenced by per patient/family report.  GOAL:   Patient will meet greater than or equal to 90% of their needs  MONITOR:   PO intake, Supplement acceptance, Labs, Weight trends, Skin, I & O's  REASON FOR ASSESSMENT:   Malnutrition Screening Tool    ASSESSMENT:   44 y/o female with h/o IBS, DM and anxiety who was admitted with cholecystitis now s/p laparoscopic cholecystectomy 2/22 for acute on chronic cholecystitis with severe intra-abdominal inflammatory response.   Pt with continued abdominal pain. Drains with 62ml output. Pt initiated on heart healthy diet today. RD will monitor pt's oral intake and add supplements if needed. RD will change pt from a heart healthy over to a carb modified/low fat diet as a heart healthy diet is restrictive of protein. Per chart, pt appears fairly weight stable at baseline.   Medications reviewed and include: insulin, lithium carbonate, protonix, NaCl @100ml /hr, zosyn   Labs reviewed: K 4.0 wnl, P 3.6 wnl, Mg 2.3 wnl Wbc- 12.5(H) cbgs- 201, 350, 269, 113, 111 x 24 hrs AIC 6.1(H)- 3/22  NUTRITION - FOCUSED PHYSICAL EXAM:    Most Recent Value  Orbital Region  No depletion  Upper Arm Region  No depletion  Thoracic and Lumbar Region  No depletion  Buccal Region  No depletion  Temple Region  No depletion  Clavicle Bone Region  No depletion  Clavicle and Acromion Bone Region  No depletion  Scapular Bone Region  No depletion  Dorsal Hand  No depletion  Patellar Region  No depletion  Anterior Thigh Region  No depletion  Posterior Calf Region  No depletion  Edema (RD Assessment)  None  Hair  Reviewed  Eyes  Reviewed  Mouth  Reviewed  Skin  Reviewed  Nails  Reviewed      Diet Order:   Diet Order            Diet Heart Room service appropriate? Yes; Fluid consistency: Thin  Diet effective now             EDUCATION NEEDS:   No education needs have been identified at this time  Skin:  Skin Assessment: Reviewed RN Assessment(incision abdomen)  Last BM:  3/22  Height:   Ht Readings from Last 1 Encounters:  04/26/19 5\' 2"  (1.575 m)    Weight:   Wt Readings from Last 1 Encounters:  04/26/19 81.2 kg    Ideal Body Weight:  50 kg  BMI:  Body mass index is 32.74 kg/m.  Estimated Nutritional Needs:   Kcal:  1700-2000kcal/day  Protein:  85-100g/day  Fluid:  >1.5L/day  Koleen Distance MS, RD, LDN Contact information available in Amion

## 2019-04-29 ENCOUNTER — Inpatient Hospital Stay
Admit: 2019-04-29 | Discharge: 2019-04-29 | Disposition: A | Discharge status: Home / Self Care | Payer: Medicare HMO | Attending: Internal Medicine | Admitting: Internal Medicine

## 2019-04-29 ENCOUNTER — Inpatient Hospital Stay: Payer: Medicare HMO

## 2019-04-29 DIAGNOSIS — J80 Acute respiratory distress syndrome: Secondary | ICD-10-CM

## 2019-04-29 DIAGNOSIS — Z9049 Acquired absence of other specified parts of digestive tract: Secondary | ICD-10-CM

## 2019-04-29 DIAGNOSIS — J9601 Acute respiratory failure with hypoxia: Secondary | ICD-10-CM

## 2019-04-29 DIAGNOSIS — R778 Other specified abnormalities of plasma proteins: Secondary | ICD-10-CM

## 2019-04-29 DIAGNOSIS — J189 Pneumonia, unspecified organism: Secondary | ICD-10-CM

## 2019-04-29 DIAGNOSIS — J9621 Acute and chronic respiratory failure with hypoxia: Secondary | ICD-10-CM

## 2019-04-29 LAB — COMPREHENSIVE METABOLIC PANEL
ALT: 23 U/L (ref 0–44)
AST: 37 U/L (ref 15–41)
Albumin: 2.3 g/dL — ABNORMAL LOW (ref 3.5–5.0)
Alkaline Phosphatase: 115 U/L (ref 38–126)
Anion gap: 11 (ref 5–15)
BUN: 6 mg/dL (ref 6–20)
CO2: 28 mmol/L (ref 22–32)
Calcium: 8.2 mg/dL — ABNORMAL LOW (ref 8.9–10.3)
Chloride: 100 mmol/L (ref 98–111)
Creatinine, Ser: 0.65 mg/dL (ref 0.44–1.00)
GFR calc Af Amer: >60 mL/min (ref >60)
GFR calc non Af Amer: >60 mL/min (ref >60)
Glucose, Bld: 135 mg/dL — ABNORMAL HIGH (ref 70–99)
Potassium: 3.5 mmol/L (ref 3.5–5.1)
Sodium: 139 mmol/L (ref 135–145)
Total Bilirubin: 0.5 mg/dL (ref 0.3–1.2)
Total Protein: 6.4 g/dL — ABNORMAL LOW (ref 6.5–8.1)

## 2019-04-29 LAB — BLOOD GAS, ARTERIAL
Acid-Base Excess: 7.4 mmol/L — ABNORMAL HIGH (ref 0.0–2.0)
Bicarbonate: 32 mmol/L — ABNORMAL HIGH (ref 20.0–28.0)
FIO2: 0.4
O2 Saturation: 92.2 %
Patient temperature: 37
pCO2 arterial: 44 mmHg (ref 32.0–48.0)
pH, Arterial: 7.47 — ABNORMAL HIGH (ref 7.350–7.450)
pO2, Arterial: 60 mmHg — ABNORMAL LOW (ref 83.0–108.0)

## 2019-04-29 LAB — CBC
HCT: 37.3 % (ref 36.0–46.0)
Hemoglobin: 12 g/dL (ref 12.0–15.0)
MCH: 29.3 pg (ref 26.0–34.0)
MCHC: 32.2 g/dL (ref 30.0–36.0)
MCV: 91.2 fL (ref 80.0–100.0)
Platelets: 358 10*3/uL (ref 150–400)
RBC: 4.09 MIL/uL (ref 3.87–5.11)
RDW: 13.8 % (ref 11.5–15.5)
WBC: 13.9 10*3/uL — ABNORMAL HIGH (ref 4.0–10.5)
nRBC: 0 % (ref 0.0–0.2)

## 2019-04-29 LAB — TROPONIN I (HIGH SENSITIVITY)
Troponin I (High Sensitivity): 64 ng/L — ABNORMAL HIGH (ref <18)
Troponin I (High Sensitivity): 53 ng/L — ABNORMAL HIGH (ref <18)

## 2019-04-29 LAB — GLUCOSE, CAPILLARY
Glucose-Capillary: 130 mg/dL — ABNORMAL HIGH (ref 70–99)
Glucose-Capillary: 124 mg/dL — ABNORMAL HIGH (ref 70–99)
Glucose-Capillary: 250 mg/dL — ABNORMAL HIGH (ref 70–99)
Glucose-Capillary: 321 mg/dL — ABNORMAL HIGH (ref 70–99)
Glucose-Capillary: 267 mg/dL — ABNORMAL HIGH (ref 70–99)
Glucose-Capillary: 224 mg/dL — ABNORMAL HIGH (ref 70–99)

## 2019-04-29 LAB — PROCALCITONIN: Procalcitonin: 1.42 ng/mL

## 2019-04-29 LAB — PROTIME-INR
INR: 1.1 (ref 0.8–1.2)
Prothrombin Time: 14.4 seconds (ref 11.4–15.2)

## 2019-04-29 LAB — LACTIC ACID, PLASMA
Lactic Acid, Venous: 1.1 mmol/L (ref 0.5–1.9)
Lactic Acid, Venous: 1 mmol/L (ref 0.5–1.9)

## 2019-04-29 LAB — BRAIN NATRIURETIC PEPTIDE: B Natriuretic Peptide: 172 pg/mL — ABNORMAL HIGH (ref 0.0–100.0)

## 2019-04-29 LAB — SURGICAL PATHOLOGY

## 2019-04-29 MED ORDER — HYDROMORPHONE HCL 1 MG/ML IJ SOLN
0.5000 mg | INTRAMUSCULAR | Status: DC | PRN
  Administered 2019-04-29 – 2019-04-30 (×4): 1 mg via INTRAVENOUS
  Filled 2019-04-29 (×4): qty 1

## 2019-04-29 MED ORDER — IPRATROPIUM-ALBUTEROL 0.5-2.5 (3) MG/3ML IN SOLN
3.0000 mL | RESPIRATORY_TRACT | Status: DC
  Administered 2019-04-29 – 2019-04-30 (×8): 3 mL via RESPIRATORY_TRACT
  Filled 2019-04-29 (×8): qty 3

## 2019-04-29 MED ORDER — ALBUTEROL SULFATE (2.5 MG/3ML) 0.083% IN NEBU: 2.5000 mg | INHALATION_SOLUTION | RESPIRATORY_TRACT | Status: DC

## 2019-04-29 MED ORDER — SODIUM CHLORIDE 0.9 % IV SOLN
500.0000 mg | INTRAVENOUS | Status: DC
  Administered 2019-04-29 – 2019-04-30 (×2): 500 mg via INTRAVENOUS
  Filled 2019-04-29 (×2): qty 500

## 2019-04-29 MED ORDER — HYDROCODONE-ACETAMINOPHEN 5-325 MG PO TABS
1.0000 | ORAL_TABLET | Freq: Four times a day (QID) | ORAL | Status: DC | PRN
  Administered 2019-04-30: 1 via ORAL
  Administered 2019-04-30 – 2019-05-02 (×2): 2 via ORAL
  Filled 2019-04-29: qty 2
  Filled 2019-04-29: qty 1
  Filled 2019-04-29: qty 2

## 2019-04-29 MED ORDER — CHLORHEXIDINE GLUCONATE CLOTH 2 % EX PADS
6.0000 | MEDICATED_PAD | Freq: Every day | CUTANEOUS | Status: DC
  Administered 2019-04-29 – 2019-05-01 (×3): 6 via TOPICAL

## 2019-04-29 MED ORDER — FUROSEMIDE 10 MG/ML IJ SOLN
40.0000 mg | Freq: Once | INTRAMUSCULAR | Status: AC
  Administered 2019-04-29: 40 mg via INTRAVENOUS
  Filled 2019-04-29: qty 4

## 2019-04-29 MED ORDER — SODIUM CHLORIDE 0.9 % IV SOLN
1.0000 g | INTRAVENOUS | Status: DC
  Administered 2019-04-29: 1 g via INTRAVENOUS
  Filled 2019-04-29: qty 10

## 2019-04-29 MED ORDER — PERFLUTREN LIPID MICROSPHERE
1.0000 mL | INTRAVENOUS | Status: AC | PRN
  Administered 2019-04-29: 2 mL via INTRAVENOUS
  Filled 2019-04-29: qty 10

## 2019-04-29 MED ORDER — PIPERACILLIN-TAZOBACTAM 3.375 G IVPB
3.3750 g | Freq: Three times a day (TID) | INTRAVENOUS | Status: DC
  Administered 2019-04-29 – 2019-05-01 (×7): 3.375 g via INTRAVENOUS
  Filled 2019-04-29 (×9): qty 50

## 2019-04-29 MED ORDER — METHYLPREDNISOLONE SODIUM SUCC 40 MG IJ SOLR
40.0000 mg | Freq: Two times a day (BID) | INTRAMUSCULAR | Status: DC
  Administered 2019-04-29 – 2019-05-01 (×5): 40 mg via INTRAVENOUS
  Filled 2019-04-29 (×5): qty 1

## 2019-04-29 NOTE — Progress Notes (Signed)
Order for hospitalist and cpap. CCU nursing consulted.

## 2019-04-29 NOTE — Progress Notes (Signed)
Assisted patient to sitting position to use incentive spiro. Due to ox. 83 % on 5 l oxygen. Was able to get IS to 700. Became exhausted and laid back down. Resp. 32. Sats back up to 88% after resting. MD notified

## 2019-04-29 NOTE — Progress Notes (Addendum)
[place] Hospital Day(s): 1.   Post op day(s): 2 Days Post-Op.   Interval History:  Patient seen and examined Overnight continue to have hypoxemia and CTA concerning for ARDS vs pneumonia and she was subsequently transferred to the ICU, started on Azithromycin and Rocephin for possible PNA. This morning, patient reports her abdominal discomfort is improved compared to yesterday Most recent O2 sat 96% on  5L HFNC, weaning down this morning Slight increase in leukocytosis to 13.9, afebrile BNP only mildly elevated at 172   Vital signs in last 24 hours: [min-max] current  Temp:  [97.7 F (36.5 C)-99.2 F (37.3 C)] 98.9 F (37.2 C) (03/24 0319) Pulse Rate:  [85-116] 85 (03/24 0600) Resp:  [18-32] 28 (03/24 0600) BP: (99-124)/(47-89) 120/61 (03/24 0600) SpO2:  [87 %-97 %] 96 % (03/24 0600) Weight:  [81.1 kg] 81.1 kg (03/24 0319)     Height: 5\' 2"  (157.5 cm) Weight: 81.1 kg BMI (Calculated): 32.69   Intake/Output last 2 shifts:  03/23 0701 - 03/24 0700 In: 3223.5 [P.O.:540; I.V.:2140.7; IV Piggyback:512.8] Out: 3580 [Urine:3550; Drains:30]   Physical Exam:  Constitutional: alert, cooperative and no distress  Respiratory: breathing non-labored at rest, on [place]  Cardiovascular: regular rate and sinus rhythm  Gastrointestinal: soft, improved incisional tenderness, non-distended, no rebound/guarding. Surgical drain in RUQ with serous output.  Integumentary: Laparoscopic incisions are CDI with steri-strips, no erythema or drainage   Labs:  CBC Latest Ref Rng & Units 04/29/2019 04/28/2019 04/27/2019  WBC 4.0 - 10.5 K/uL 13.9(H) 12.5(H) 12.0(H)  Hemoglobin 12.0 - 15.0 g/dL 12.0 11.7(L) 12.3  Hematocrit 36.0 - 46.0 % 37.3 36.6 38.1  Platelets 150 - 400 K/uL 358 311 312   CMP Latest Ref Rng & Units 04/28/2019 04/27/2019 04/26/2019  Glucose 70 - 99 mg/dL 135(H) 118(H) 269(H)  BUN 6 - 20 mg/dL 5(L) 6 5(L)  Creatinine 0.44 - 1.00 mg/dL 0.68 0.62  0.81  Sodium 135 - 145 mmol/L 141 141 130(L)  Potassium 3.5 - 5.1 mmol/L 4.0 3.7 3.9  Chloride 98 - 111 mmol/L 109 107 96(L)  CO2 22 - 32 mmol/L 25 25 23   Calcium 8.9 - 10.3 mg/dL 8.0(L) 8.4(L) 8.9  Total Protein 6.5 - 8.1 g/dL 6.1(L) 6.3(L) 7.7  Total Bilirubin 0.3 - 1.2 mg/dL 0.3 0.4 0.6  Alkaline Phos 38 - 126 U/L 97 97 120  AST 15 - 41 U/L 40 19 19  ALT 0 - 44 U/L 24 16 21      Imaging studies:   CXR (04/29/2019) reviewed:  IMPRESSION: Diffuse bilateral airspace disease, unchanged.   CTA Chest (04/28/2019) reviewed:  IMPRESSION: No evidence of pulmonary embolus.   Extensive diffuse bilateral airspace disease with upper and mid lung predominance. This likely reflects infection or ARDS.   Assessment/Plan:  44 y.o. female 2 Days Post-Op s/p laparoscopic cholecystectomy for acute on chronic cholecystitis with severe intra-abdominal inflammatory response, complicated by development of likely ARDS post-operatively   - Continue regular diet   - Continue IV ABx (Zosyn + Rocephin/Azithromycin)   - Appreciate PCCM & Medicine help with pulmonary issues   - pain control prn (multimodal); antiemetics prn             - monitor abdominal examination   - trend leukocytosis  - mobilization encouraged   All of the above findings and recommendations were discussed with the patient, and the medical team, and all of patient's questions were answered to her expressed satisfaction.  -- Edison Simon, PA-C   [facility] 04/29/2019, 7:50 AM [phone] M-F: 7am - 4pm  Pus from gallbladder growing Citrobacter braakii and Staph aureus. Should be covered by current abx regimen. Zosyn may be redundant and could be d/c'd, if pharmacy/PCCM agree.  I saw and evaluated the patient.  I agree with the above documentation, exam, and plan, which I have edited where appropriate. Keirsten Matuska  1:32 PM

## 2019-04-29 NOTE — Progress Notes (Signed)
eLink Physician-Brief Progress Note Patient Name: Vanessa Baker DOB: 11/03/1975 MRN: [id number]   Date of Service  04/29/2019  HPI/Events of Note  Pt admitted to the hospital for acute cholecystitis, and underwent Lap cholecystectomy, post-op she developed bilateral lung infiltrates and hypoxemia, she was transferred to the ICU due to concern for evolving ARDS vs bilateral pneumonia.  eICU Interventions  New Patient Evaluation completed.        Kerry Kass Alilah Mcmeans 04/29/2019, 4:28 AM

## 2019-04-29 NOTE — Consult Note (Signed)
Name: Vanessa Baker MRN: [id number] DOB: [birth year]    ADMISSION DATE:  04/26/2019 CONSULTATION DATE:  04/29/2019  REFERRING MD :  Dr. Damita Dunnings  CHIEF COMPLAINT:  Hypoxia  BRIEF PATIENT DESCRIPTION:  44 year old female admitted 04/26/2019 with acute cholecystitis, status post laparoscopic cholecystectomy on 3/22.  During the procedure she was noted to have massive inflammatory response in the abdomen.  On 3/23 patient with hypoxia and increasing FiO2 requirements.  CT chest concerning for developing ARDS versus multifocal pneumonia.  Transferred to ICU on 3/24.  SIGNIFICANT EVENTS  3/21: Admission to [place] unit 3/22: Status post laparoscopic cholecystectomy 3/23: Hypoxic with increasing FiO2 requirements; CT chest with developing ARDS versus multifocal pneumonia 3/24: Transfer to ICU; hospitalist and PCCM consulted  STUDIES:  3/21: CT abdomen and pelvis>>Cholelithiasis. Marked irregular gallbladder wall thickening with surrounding inflammation. Findings compatible with acute cholecystitis. Fatty infiltration of the liver. 3/23: Chest x-ray>>Mediastinum and hilar structures normal. Heart size normal. Diffuse bilateral pulmonary infiltrates noted. No pleural effusion or pneumothorax. No acute bony abnormality identified 3/23: CTA chest>>No evidence of pulmonary embolus. Extensive diffuse bilateral airspace disease with upper and mid lung predominance. This likely reflects infection or ARDS. 3/24: Chest x-ray>>Extensive bilateral airspace disease again noted, unchanged. Heart is borderline in size. No effusions or pneumothorax. No acute bony Abnormality. 3/24: Abdominal x-ray>>Mild diffuse gaseous distention of bowel may reflect mild ileus.  CULTURES: SARS-CoV-2 POC antigen>> negative Blood culture x2 3/21>> Urine 3/21>> multiple species, recommends recollection Aerobic/anaerobic culture from gallbladder 3/22>> Strep pneumo urinary antigen 3/24>> Legionella urinary  antigen 3/24>>  ANTIBIOTICS: Zosyn 3/21>> Azithromycin 3/24>> Ceftriaxone 3/24>>  HISTORY OF PRESENT ILLNESS:  Vanessa Baker is a 44 year old female with a past medical history notable for asthma, anxiety, bipolar disorder, depression, diabetes mellitus type 2, hypertension, GERD, headache, hypertension, irritable bowel syndrome who presented to [facility] ED on 04/26/2019 with abdominal pain.  She was found to have leukocytosis with WBC 17.8.  CT abdomen and pelvis was concerning for acute cholecystitis.  General surgery was consulted, and she was admitted to the [facility] unit for further work-up and treatment.  On 3/22 she underwent laparoscopic cholecystectomy, and was noted to have massive inflammatory response within the abdomen.  On 3/23 she was noted to be hypoxic requiring 4L supplemental O2.  She was given 20 mg IV Lasix.  CTA chest was obtained which was negative for PE, however did reveal diffuse bilateral opacities concerning for ARDS versus multifocal pneumonia.  Throughout the evening and early the next morning on 3/24, her FiO2 requirements continued to increase.  ABG shows pH 7.47 / pCO2 44 / pO2 60 / Bicarb 32.0.   WBC 13.9, BNP 172, high-sensitivity troponin 64, lactic acid 1.1, and procalcitonin 1.42. Hospitalist service was consulted, and she was transferred to ICU for high flow nasal cannula and closer monitoring.  PCCM is consulted for further work-up and management of acute hypoxic respiratory failure in the setting of developing ARDS versus multifocal pneumonia.  PAST MEDICAL HISTORY :   has a past medical history of Allergy, Anxiety, Asthma, Bipolar disorder ([place]), Bursitis, Depression, Diabetes mellitus without complication ([place]), Essential hypertension (03/28/2017), Frequent headaches, GERD (gastroesophageal reflux disease), Headache, High cholesterol, Hypertension, Irritable bowel syndrome (IBS), Irritable colon (02/23/2010), Migraine (04/02/2017), Obesity (BMI 30-39.9)  (11/25/2013), Seizure ([place]) ([id number]), Seizures ([place]), and Tremor.  has a past surgical history that includes No past surgeries and Cholecystectomy (N/A, 04/27/2019). Prior to Admission medications   Medication Sig Start Date End Date Taking? Authorizing Provider  ACCU-CHEK AVIVA PLUS  test strip USE 1 STRIP TO CHECK GLUCOSE UP TO 3 TIMES DAILY AS DIRECTED. 12/23/18   Parks Ranger, Devonne Doughty, DO  Accu-Chek Softclix Lancets lancets Use to check blood sugar up to 3 times daily as instructed 12/23/18   Karamalegos, Devonne Doughty, DO  acetaminophen (TYLENOL) 500 MG tablet Take 500 mg by mouth every 6 (six) hours as needed.    [provider]  albuterol (PROAIR HFA) 108 (90 Base) MCG/ACT inhaler Inhale 2 puffs into the lungs every 4 (four) hours as needed for wheezing or shortness of breath. 12/17/18   Karamalegos, Devonne Doughty, DO  atorvastatin (LIPITOR) 40 MG tablet Take 1 tablet (40 mg total) by mouth daily. 12/17/18   Karamalegos, Devonne Doughty, DO  azithromycin (ZITHROMAX) 500 MG tablet Take 1 tablet (500 mg total) by mouth daily. 03/25/19   Lucilla Lame, MD  baclofen (LIORESAL) 10 MG tablet Take 1 tablet (10 mg total) by mouth 3 (three) times daily as needed for muscle spasms. 04/22/19   Karamalegos, Devonne Doughty, DO  benztropine (COGENTIN) 1 MG tablet Take 1 tablet (1 mg total) by mouth 3 (three) times daily. 02/10/19   Ursula Alert, MD  blood glucose meter kit and supplies Dispense based on patient and insurance preference. Use up to four times daily as directed. (FOR ICD-10 E10.9, E11.9). 09/12/17   Mikey College, NP  Blood Glucose Monitoring Suppl (ACCU-CHEK AVIVA PLUS) w/Device KIT Use to check blood sugar up to 3 times daily 12/23/18   Parks Ranger, Devonne Doughty, DO  budesonide-formoterol [facility]) 160-4.5 MCG/ACT inhaler Inhale into the lungs.    [provider]  Calcium Carb-Cholecalciferol (CALCIUM 600+D3 PO) Take 1 tablet by mouth daily.    [provider]    Dulaglutide (TRULICITY) 1.5 UJ/8.1XB SOPN Inject 1.5 mg into the skin every 7 (seven) days. 12/17/18   Karamalegos, Devonne Doughty, DO  esomeprazole (NEXIUM) 20 MG capsule Take 1 capsule (20 mg total) by mouth daily at 12 noon. 12/17/18   Karamalegos, Devonne Doughty, DO  Fluticasone-Umeclidin-Vilant (TRELEGY ELLIPTA) 100-62.5-25 MCG/INH AEPB Inhale 1 puff into the lungs daily. 12/17/18   Karamalegos, Devonne Doughty, DO  gabapentin (NEURONTIN) 300 MG capsule Take 1 capsule (300 mg total) by mouth 2 (two) times daily. For restless legs 12/16/18   Olin Hauser, DO  ibuprofen (ADVIL,MOTRIN) 200 MG tablet Take 400-800 mg by mouth every 6 (six) hours as needed (for pain or headaches).    [provider]  linaclotide Rolan Lipa) 145 MCG CAPS capsule Take 1 capsule (145 mcg total) by mouth daily before breakfast. 12/30/18   Karamalegos, Devonne Doughty, DO  lisinopril (ZESTRIL) 10 MG tablet Take 1 tablet (10 mg total) by mouth daily. 04/22/19   Karamalegos, Devonne Doughty, DO  lithium carbonate 300 MG capsule Take 1 capsule (300 mg total) by mouth 2 (two) times daily with a meal. 04/09/19   Ursula Alert, MD  metFORMIN (GLUCOPHAGE) 500 MG tablet Take 500 mg by mouth 2 (two) times daily. 03/27/19   [provider]  metFORMIN (GLUCOPHAGE) 500 MG tablet Take by mouth.    [provider]  Multiple Vitamins-Minerals (MULTIVITAMIN ADULT EXTRA C) CHEW Chew by mouth. 02/09/03   [provider]  Na Sulfate-K Sulfate-Mg Sulf (SUPREP BOWEL PREP KIT) 17.5-3.13-1.6 GM/177ML SOLN Take 1 kit by mouth as directed. 03/19/19   Lin Landsman, MD  norethindrone-ethinyl estradiol (JUNEL FE 1/20) 1-20 MG-MCG tablet Take 1 tablet by mouth daily. 04/22/19   Karamalegos, Alexander J, DO  OLANZapine (ZYPREXA) 15 MG  tablet Take 1 tablet (15 mg total) by mouth at bedtime. 02/10/19   Ursula Alert, MD  omega-3 acid ethyl esters (LOVAZA) 1 g capsule Take 1 capsule by mouth twice daily 03/04/19   Karamalegos,  Devonne Doughty, DO  Omega-3 Fatty Acids (RA FISH OIL) 1000 MG CAPS Take by mouth.    [provider]  primidone (MYSOLINE) 50 MG tablet Take 1 tablet (50 mg total) by mouth at bedtime. 12/17/18   Karamalegos, Devonne Doughty, DO  sertraline (ZOLOFT) 100 MG tablet Take 1 tablet (100 mg total) by mouth daily. 02/10/19   Ursula Alert, MD  traZODone (DESYREL) 100 MG tablet Take 1.5 tablets (150 mg total) by mouth at bedtime. For sleep 02/10/19   Ursula Alert, MD   Allergies  Allergen Reactions   Amitriptyline Other (See Comments)    Confusion, paralysis    FAMILY HISTORY:  family history includes ADD / ADHD in her mother and son; Alcohol abuse in her maternal grandfather, maternal grandmother, maternal uncle, and mother; Anxiety disorder in her brother, mother, and sister; Bipolar disorder in her brother, mother, and sister; Breast cancer in her maternal aunt, maternal grandmother, paternal aunt, and paternal grandmother; Cancer in her father and mother; Diabetes in her mother; Drug abuse in her brother, mother, and sister; Hypertension in her father and mother; Parkinson's disease in her maternal grandfather; Schizophrenia in her mother. SOCIAL HISTORY:  reports that she has been smoking cigarettes. She has a 13.50 pack-year smoking history. She has never used smokeless tobacco. She reports previous alcohol use. She reports that she does not use drugs.   COVID-19 DISASTER DECLARATION:  FULL CONTACT PHYSICAL EXAMINATION WAS NOT POSSIBLE DUE TO TREATMENT OF COVID-19 AND  CONSERVATION OF PERSONAL PROTECTIVE EQUIPMENT, LIMITED EXAM FINDINGS INCLUDE-  Patient assessed or the symptoms described in the history of present illness.  In the context of the Global COVID-19 pandemic, which necessitated consideration that the patient might be at risk for infection with the SARS-CoV-2 virus that causes COVID-19, Institutional protocols and algorithms that pertain to the evaluation of patients at risk  for COVID-19 are in a state of rapid change based on information released by regulatory bodies including the CDC and federal and state organizations. These policies and algorithms were followed during the patient's care while in hospital.  REVIEW OF SYSTEMS:  Positives in BOLD Constitutional: Negative for fever, chills, weight loss, malaise/fatigue and diaphoresis.  HENT: Negative for hearing loss, ear pain, nosebleeds, congestion, sore throat, neck pain, tinnitus and ear discharge.   Eyes: Negative for blurred vision, double vision, photophobia, pain, discharge and redness.  Respiratory: Negative for +cough, hemoptysis, sputum production, shortness of breath, wheezing and stridor.   Cardiovascular: Negative for chest pain, palpitations, orthopnea, claudication, leg swelling and PND.  Gastrointestinal: Negative for heartburn, nausea, vomiting, +abdominal pain, diarrhea, constipation, blood in stool and melena.  Genitourinary: Negative for dysuria, urgency, frequency, hematuria and flank pain.  Musculoskeletal: Negative for myalgias, back pain, joint pain and falls.  Skin: Negative for itching and rash.  Neurological: Negative for dizziness, tingling, tremors, sensory change, speech change, focal weakness, seizures, loss of consciousness, weakness and headaches.  Endo/Heme/Allergies: Negative for environmental allergies and polydipsia. Does not bruise/bleed easily.  SUBJECTIVE:  Pt reports abdominal pain and intermittent dry cough Denies chest pain, SOB, N/V, fever/chills On 10L HFNC  VITAL SIGNS: Temp:  [97.7 F (36.5 C)-99.2 F (37.3 C)] 97.7 F (36.5 C) (03/24 0200) Pulse Rate:  [92-116] 92 (03/24 0245) Resp:  [18-32] 24 (03/24 0245)  BP: (99-153)/(47-77) 106/54 (03/24 0200) SpO2:  [87 %-97 %] 97 % (03/24 0245)  PHYSICAL EXAMINATION: General: Acutely ill-appearing female, laying in bed, on 10 L high flow nasal cannula, no acute distress Neuro: Drowsy, arouses to voice, alert and  oriented x3, follows commands, no focal deficits, speech clear HEENT: Atraumatic, normocephalic, neck supple, no JVD Cardiovascular: Regular rate and rhythm, S1-S2, no murmurs rubs or gallops, 2+ pulses Lungs: Crackles upon auscultation bilaterally, no wheezing, even, nonlabored, normal effort Abdomen: Obese, soft, tender to palpation, no new guarding or rebound tenderness, hypoactive bowel sounds Musculoskeletal: Normal bulk and tone, no deformities, no edema Skin: Warm and dry, no obvious rashes, lesions, or ulcerations.  Recent Labs  Lab 04/26/19 1724 04/27/19 0614 04/28/19 0514  NA 130* 141 141  K 3.9 3.7 4.0  CL 96* 107 109  CO2 '23 25 25  '$ BUN 5* 6 5*  CREATININE 0.81 0.62 0.68  GLUCOSE 269* 118* 135*   Recent Labs  Lab 04/27/19 0614 04/28/19 0514 04/29/19 0203  HGB 12.3 11.7* 12.0  HCT 38.1 36.6 37.3  WBC 12.0* 12.5* 13.9*  PLT 312 311 358   CT ANGIO CHEST PE W OR WO CONTRAST  Result Date: 04/29/2019 CLINICAL DATA:  Shortness of breath.  Acute respiratory distress. EXAM: CT ANGIOGRAPHY CHEST WITH CONTRAST TECHNIQUE: Multidetector CT imaging of the chest was performed using the standard protocol during bolus administration of intravenous contrast. Multiplanar CT image reconstructions and MIPs were obtained to evaluate the vascular anatomy. CONTRAST:  26m OMNIPAQUE IOHEXOL 350 MG/ML SOLN COMPARISON:  Chest x-ray today. FINDINGS: Cardiovascular: No filling defects in the pulmonary arteries to suggest pulmonary emboli. Heart is normal size. Aorta is normal caliber. Mediastinum/Nodes: No mediastinal, hilar, or axillary adenopathy. Trachea and esophagus are unremarkable. Thyroid unremarkable. Lungs/Pleura: Extensive diffuse ground-glass airspace opacities within the lungs, most pronounced in the upper and mid lung zones. No pleural effusion or pneumothorax. Upper Abdomen: Fatty infiltration of the liver. Prior cholecystectomy. Musculoskeletal: Chest wall soft tissues are  unremarkable. No acute bony abnormality. Review of the MIP images confirms the above findings. IMPRESSION: No evidence of pulmonary embolus. Extensive diffuse bilateral airspace disease with upper and mid lung predominance. This likely reflects infection or ARDS. Electronically Signed   By: KRolm BaptiseM.D.   On: 04/29/2019 01:28   DG Chest Port 1 View  Result Date: 04/29/2019 CLINICAL DATA:  Fever, chills EXAM: PORTABLE CHEST 1 VIEW COMPARISON:  03/31/2019 FINDINGS: Extensive bilateral airspace disease again noted, unchanged. Heart is borderline in size. No effusions or pneumothorax. No acute bony abnormality. IMPRESSION: Diffuse bilateral airspace disease, unchanged. Electronically Signed   By: KRolm BaptiseM.D.   On: 04/29/2019 02:06   DG Chest Port 1 View  Result Date: 04/28/2019 CLINICAL DATA:  Shortness of breath. EXAM: PORTABLE CHEST 1 VIEW COMPARISON:  04/26/2019. FINDINGS: Mediastinum and hilar structures normal. Heart size normal. Diffuse bilateral pulmonary infiltrates noted. No pleural effusion or pneumothorax. No acute bony abnormality identified. IMPRESSION: Diffuse bilateral pulmonary infiltrates. Electronically Signed   By: TMarcello Moores Register   On: 04/28/2019 11:33   DG Abd Portable 2V  Result Date: 04/29/2019 CLINICAL DATA:  Abdominal pain, fever, chills EXAM: PORTABLE ABDOMEN - 2 VIEW COMPARISON:  03/18/2015 FINDINGS: Prior cholecystectomy. Contrast seen within the colon. Mild gaseous distention of bowel diffusely. No organomegaly or free air. IMPRESSION: Mild diffuse gaseous distention of bowel may reflect mild ileus. Electronically Signed   By: KRolm BaptiseM.D.   On: 04/29/2019 02:52    ASSESSMENT /  PLAN:  Acute hypoxic respiratory failure secondary to developing ARDS vs. multifocal pneumonia Hx: Asthma -Supplemental O2 as needed to maintain O2 sats greater than 92% -Place on HFNC -High risk for intubation -Follow intermittent chest x-ray and ABG as needed -Incentive  spirometry -IV antibiotics as above -Received 20 mg IV Lasix 3/23; consider additional Lasix as blood pressure and renal function permits -Conservative fluid management -Encourage self-proning as able -PRN Bronchodilators  Acute cholecystitis s/p laparoscopic cholecystectomy on 04/27/19 w/ massive inflammatory response within the abdomen ? Multifocal Pneumonia -Monitor fever curve -Trend WBCs and procalcitonin -Follow cultures as above -Continue Zosyn -General surgery is primary service, will follow recommendations -Placed on Azithromycin & Ceftriaxone by Hospitalist  Mildly elevated troponin, likely demand ischemia Hx: Hypertension -Continouus cardiac monitoring -Maintain MAP >65 -Trend troponin until downtrending  Type 2 diabetes mellitus -CBGs -Sliding scale insulin -Follow ICU hypo/hyperglycemia protocol        Disposition: ICU Goals of care: Full code VTE prophylaxis: Lovenox SQ Updates: Updated patient at bedside 04/29/2019   Darel Hong, [facility] Pager: [phone]  04/29/2019, 3:55 AM

## 2019-04-29 NOTE — Progress Notes (Addendum)
PROGRESS NOTE                                                                                                                                                                                                             Patient Demographics:    Vanessa Baker, is a 44 y.o. female, DOB - [birth year], JJH:[id number]  Admit date - 04/26/2019   Admitting Physician Fredirick Maudlin, MD  Outpatient Primary MD for the patient is Mikey College, NP (Inactive)  LOS - 1    Chief Complaint  Patient presents with  . Fever  . Abdominal Pain       Brief Narrative  Medical consult on 44 y/o obese femalewith DM and controled asthma who was admitted for lap choly on 3/23 but went into acute respiratory failure with hypoxia. CT angio chest negative for PE but showed diffuse infiltrate vs ARDS. Transferred to ICU   Subjective:   Seen and examined. C/p abdominal soreness. Having flatus. Still on 5L o2 with sats in low 90s. Not in resp distress      Principal Problem:   ARDS (adult respiratory distress syndrome) (HCC) acute respiratory failure with hypoxemia ( HCC) Secondary to multifocal pneumonia +/- acute pulmonary edema. Continue SDU monitoring. empiric rocephin and azithro. Follow cultures. Wheezing on exam b/l. Add iv solumedrol 40 mg bid and lasix 40 mg x1. Check 2d echo. Strict I/o. Albuterol nebs scheduled. Wean o2 as tolerated. possibly transfer to tele this afternoon if breathing  improved.   Active Problems:    Type 2 diabetes mellitus with other specified complication (HCC) Monitor on SSI. A1C of 6. Added solumedrol today. Add premeal aspart if cbg elevated.    Cholecystitis, acute   S/P laparoscopic cholecystectomy percutaneous in place. Pain control as needed. Plan per surgery.  Elevated troponin  suspect demand ischemia. No chest pain symptoms or ekg changes. Check 2d echo.  Moderate persistent asthma  pt  wheezing b/l this am. Add solumedrol bid and scheduled albuterol neb.   Essential HTN  BP stable.  hospitalist will continue to follow.   Code Status : full code  Family Communication  : none at bedside  Disposition Plan  : per primary  Barriers For Discharge : active symptoms, hypoxic resp failuire    Procedures  :lap choly, CTA, echo  DVT Prophylaxis  :  Lovenox -   Lab Results  Component Value Date   PLT 358 04/29/2019    Antibiotics  :   Anti-infectives (From admission, onward)   Start     Dose/Rate Route Frequency Ordered Stop   04/29/19 0415  cefTRIAXone (ROCEPHIN) 1 g in sodium chloride 0.9 % 100 mL IVPB     1 g 200 mL/hr over 30 Minutes Intravenous Every 24 hours 04/29/19 0404     04/29/19 0415  azithromycin (ZITHROMAX) 500 mg in sodium chloride 0.9 % 250 mL IVPB     500 mg 250 mL/hr over 60 Minutes Intravenous Every 24 hours 04/29/19 0404     04/27/19 0600  piperacillin-tazobactam (ZOSYN) IVPB 3.375 g  Status:  Discontinued     3.375 g 12.5 mL/hr over 240 Minutes Intravenous Every 8 hours 04/26/19 2052 04/29/19 0434   04/26/19 2030  piperacillin-tazobactam (ZOSYN) IVPB 3.375 g     3.375 g 100 mL/hr over 30 Minutes Intravenous  Once 04/26/19 2024 04/26/19 2109        Objective:   Vitals:   04/29/19 0600 04/29/19 0700 04/29/19 0800 04/29/19 0809  BP: 120/61 105/87 (!) 146/74   Pulse: 85 90 92 89  Resp: (!) 28 (!) 25 (!) 22 (!) 29  Temp:    98.1 F (36.7 C)  TempSrc:    Oral  SpO2: 96% 97% 92% (!) 86%  Weight:      Height:        Wt Readings from Last 3 Encounters:  04/29/19 81.1 kg  03/19/19 82.6 kg  03/16/19 83.5 kg     Intake/Output Summary (Last 24 hours) at 04/29/2019 0858 Last data filed at 04/29/2019 0700 Gross per 24 hour  Intake 3223.52 ml  Output 3580 ml  Net -356.48 ml     Physical Exam  Gen: not in distress, fatiged HEENT: , moist mucosa, supple neck Chest: diffuse wheezing b/l, no cracles CVS: N S1&S2, no murmurs,    GI: soft, non distended, RUQ drain with serosanguinous output, BS+, non tedner Musculoskeletal: warm, no edema     Data Review:    CBC Recent Labs  Lab 04/26/19 1724 04/27/19 0614 04/28/19 0514 04/29/19 0203  WBC 17.8* 12.0* 12.5* 13.9*  HGB 13.7 12.3 11.7* 12.0  HCT 43.0 38.1 36.6 37.3  PLT 356 312 311 358  MCV 91.9 91.8 91.5 91.2  MCH 29.3 29.6 29.3 29.3  MCHC 31.9 32.3 32.0 32.2  RDW 13.1 13.3 13.5 13.8  LYMPHSABS 1.7  --   --   --   MONOABS 1.1*  --   --   --   EOSABS 0.2  --   --   --   BASOSABS 0.2*  --   --   --     Chemistries  Recent Labs  Lab 04/26/19 1724 04/27/19 0614 04/28/19 0514 04/29/19 0203  NA 130* 141 141 139  K 3.9 3.7 4.0 3.5  CL 96* 107 109 100  CO2 23 25 25 28   GLUCOSE 269* 118* 135* 135*  BUN 5* 6 5* 6  CREATININE 0.81 0.62 0.68 0.65  CALCIUM 8.9 8.4* 8.0* 8.2*  MG  --   --  2.3  --   AST 19 19 40 37  ALT 21 16 24 23   ALKPHOS 120 97 97 115  BILITOT 0.6 0.4 0.3 0.5   ------------------------------------------------------------------------------------------------------------------ No results for input(s): CHOL, HDL, LDLCALC, TRIG, CHOLHDL, LDLDIRECT in the last 72 hours.  Lab Results  Component Value Date   HGBA1C  6.1 (H) 04/27/2019   ------------------------------------------------------------------------------------------------------------------ No results for input(s): TSH, T4TOTAL, T3FREE, THYROIDAB in the last 72 hours.  Invalid input(s): FREET3 ------------------------------------------------------------------------------------------------------------------ No results for input(s): VITAMINB12, FOLATE, FERRITIN, TIBC, IRON, RETICCTPCT in the last 72 hours.  Coagulation profile Recent Labs  Lab 04/26/19 1724 04/29/19 0203  INR 1.0 1.1    No results for input(s): DDIMER in the last 72 hours.  Cardiac Enzymes No results for input(s): CKMB, TROPONINI, MYOGLOBIN in the last 168 hours.  Invalid input(s):  CK ------------------------------------------------------------------------------------------------------------------    Component Value Date/Time   BNP 172.0 (H) 04/29/2019 0203    Inpatient Medications  Scheduled Meds: . acetaminophen  1,000 mg Oral Q6H  . benztropine  1 mg Oral TID  . Chlorhexidine Gluconate Cloth  6 each Topical Daily  . enoxaparin (LOVENOX) injection  40 mg Subcutaneous Q24H  . fluticasone furoate-vilanterol  1 puff Inhalation Daily  . furosemide  40 mg Intravenous Once  . gabapentin  300 mg Oral BID  . insulin aspart  0-15 Units Subcutaneous Q4H  . lithium carbonate  300 mg Oral BID WC  . methylPREDNISolone (SOLU-MEDROL) injection  40 mg Intravenous Q12H  . norethindrone-ethinyl estradiol  1 tablet Oral Daily  . OLANZapine  15 mg Oral QHS  . pantoprazole  40 mg Oral Daily  . pneumococcal 23 valent vaccine  0.5 mL Intramuscular Tomorrow-1000  . primidone  50 mg Oral QHS  . sertraline  100 mg Oral Daily  . traZODone  150 mg Oral QHS  . umeclidinium bromide  1 puff Inhalation Daily   Continuous Infusions: . sodium chloride Stopped (04/27/19 1136)  . azithromycin 20 mL/hr at 04/29/19 0700  . cefTRIAXone (ROCEPHIN)  IV Stopped (04/29/19 0534)   PRN Meds:.sodium chloride, albuterol, baclofen, HYDROmorphone (DILAUDID) injection, ondansetron **OR** ondansetron (ZOFRAN) IV  Micro Results Recent Results (from the past 240 hour(s))  Culture, blood (Routine x 2)     Status: None (Preliminary result)   Collection Time: 04/26/19  5:24 PM   Specimen: BLOOD  Result Value Ref Range Status   Specimen Description BLOOD LAL  Final   Special Requests   Final    BOTTLES DRAWN AEROBIC AND ANAEROBIC Blood Culture adequate volume   Culture   Final    NO GROWTH 3 DAYS Performed at [facility], [address], [place], [place] [address]    Report Status PENDING  Incomplete  Urine Culture     Status: Abnormal   Collection Time: 04/26/19  5:26 PM   Specimen:  Urine, Random  Result Value Ref Range Status   Specimen Description   Final    URINE, RANDOM Performed at [facility], [address] [place], [place] [address]    Special Requests   Final    NONE Performed at [facility], [address] [place], [place] [address]    Culture MULTIPLE SPECIES PRESENT, SUGGEST RECOLLECTION (A)  Final   Report Status 04/27/2019 FINAL  Final  Culture, blood (Routine x 2)     Status: None (Preliminary result)   Collection Time: 04/26/19  6:26 PM   Specimen: BLOOD  Result Value Ref Range Status   Specimen Description BLOOD RFA  Final   Special Requests   Final    BOTTLES DRAWN AEROBIC AND ANAEROBIC Blood Culture adequate volume   Culture   Final    NO GROWTH 3 DAYS Performed at [facility], [address], [place], [place] [address]    Report Status PENDING  Incomplete  MRSA PCR Screening     Status: None   Collection Time: 04/27/19  7:30 AM   Specimen: Nasal Mucosa; Nasopharyngeal  Result Value Ref Range Status   MRSA by PCR NEGATIVE NEGATIVE Final    Comment:        The GeneXpert MRSA Assay (FDA approved for NASAL specimens only), is one component of a comprehensive MRSA colonization surveillance program. It is not intended to diagnose MRSA infection nor to guide or monitor treatment for MRSA infections. Performed at [facility], [address] [place], [place] [address]   Aerobic/Anaerobic Culture (surgical/deep wound)     Status: None (Preliminary result)   Collection Time: 04/27/19 11:34 AM   Specimen: PATH Gallbladder; Body Fluid  Result Value Ref Range Status   Specimen Description   Final    GALL BLADDER Performed at [facility], [address] [place], [place] [address]    Special Requests   Final    NONE Performed at [facility], [place], [place] [address]    Gram Stain   Final    ABUNDANT WBC PRESENT, PREDOMINANTLY PMN ABUNDANT GRAM NEGATIVE  RODS MODERATE GRAM POSITIVE COCCI Performed at [facility], [place] [address], [place], [place] [address]    Culture   Final    ABUNDANT CITROBACTER BRAAKII CULTURE REINCUBATED FOR BETTER GROWTH NO ANAEROBES ISOLATED; CULTURE IN PROGRESS FOR 5 DAYS    Report Status PENDING  Incomplete   Organism ID, Bacteria CITROBACTER BRAAKII  Final      Susceptibility   Citrobacter braakii - MIC*    CEFAZOLIN RESISTANT Resistant     CEFEPIME 1 SENSITIVE Sensitive     CEFTAZIDIME 4 SENSITIVE Sensitive     CEFTRIAXONE 1 SENSITIVE Sensitive     CIPROFLOXACIN <=0.25 SENSITIVE Sensitive     GENTAMICIN <=1 SENSITIVE Sensitive     IMIPENEM <=0.25 SENSITIVE Sensitive     TRIMETH/SULFA <=20 SENSITIVE Sensitive     PIP/TAZO <=4 SENSITIVE Sensitive     * ABUNDANT CITROBACTER BRAAKII    Radiology Reports CT ANGIO CHEST PE W OR WO CONTRAST  Result Date: 04/29/2019 CLINICAL DATA:  Shortness of breath.  Acute respiratory distress. EXAM: CT ANGIOGRAPHY CHEST WITH CONTRAST TECHNIQUE: Multidetector CT imaging of the chest was performed using the standard protocol during bolus administration of intravenous contrast. Multiplanar CT image reconstructions and MIPs were obtained to evaluate the vascular anatomy. CONTRAST:  56mL OMNIPAQUE IOHEXOL 350 MG/ML SOLN COMPARISON:  Chest x-ray today. FINDINGS: Cardiovascular: No filling defects in the pulmonary arteries to suggest pulmonary emboli. Heart is normal size. Aorta is normal caliber. Mediastinum/Nodes: No mediastinal, hilar, or axillary adenopathy. Trachea and esophagus are unremarkable. Thyroid unremarkable. Lungs/Pleura: Extensive diffuse ground-glass airspace opacities within the lungs, most pronounced in the upper and mid lung zones. No pleural effusion or pneumothorax. Upper Abdomen: Fatty infiltration of the liver. Prior cholecystectomy. Musculoskeletal: Chest wall soft tissues are unremarkable. No acute bony abnormality. Review of the MIP images confirms the  above findings. IMPRESSION: No evidence of pulmonary embolus. Extensive diffuse bilateral airspace disease with upper and mid lung predominance. This likely reflects infection or ARDS. Electronically Signed   By: Rolm Baptise M.D.   On: 04/29/2019 01:28   CT ABDOMEN PELVIS W CONTRAST  Result Date: 04/26/2019 CLINICAL DATA:  Abdominal pain, distention, vomiting EXAM: CT ABDOMEN AND PELVIS WITH CONTRAST TECHNIQUE: Multidetector CT imaging of the abdomen and pelvis was performed using the standard protocol following bolus administration of intravenous  contrast. CONTRAST:  157mL OMNIPAQUE IOHEXOL 300 MG/ML  SOLN COMPARISON:  05/28/2017 FINDINGS: Lower chest: Lung bases are clear. No effusions. Heart is normal size. Hepatobiliary: Diffuse fatty infiltration of the liver. No focal hepatic abnormality. Marked irregular gallbladder wall thickening. Stones are seen within the gallbladder. No biliary ductal dilatation. Surrounding inflammation. Findings compatible with acute cholecystitis. Pancreas: No focal abnormality or ductal dilatation. Spleen: No focal abnormality.  Normal size. Adrenals/Urinary Tract: No adrenal abnormality. No focal renal abnormality. No stones or hydronephrosis. Urinary bladder is unremarkable. Stomach/Bowel: Normal appendix. Stomach, large and small bowel grossly unremarkable. Vascular/Lymphatic: No evidence of aneurysm or adenopathy. Reproductive: Uterus and adnexa unremarkable.  No mass. Other: No free fluid or free air. Musculoskeletal: No acute bony abnormality. IMPRESSION: Cholelithiasis. Marked irregular gallbladder wall thickening with surrounding inflammation. Findings compatible with acute cholecystitis. Fatty infiltration of the liver. Electronically Signed   By: Rolm Baptise M.D.   On: 04/26/2019 20:28   DG Chest Port 1 View  Result Date: 04/29/2019 CLINICAL DATA:  Fever, chills EXAM: PORTABLE CHEST 1 VIEW COMPARISON:  03/31/2019 FINDINGS: Extensive bilateral airspace disease  again noted, unchanged. Heart is borderline in size. No effusions or pneumothorax. No acute bony abnormality. IMPRESSION: Diffuse bilateral airspace disease, unchanged. Electronically Signed   By: Rolm Baptise M.D.   On: 04/29/2019 02:06   DG Chest Port 1 View  Result Date: 04/28/2019 CLINICAL DATA:  Shortness of breath. EXAM: PORTABLE CHEST 1 VIEW COMPARISON:  04/26/2019. FINDINGS: Mediastinum and hilar structures normal. Heart size normal. Diffuse bilateral pulmonary infiltrates noted. No pleural effusion or pneumothorax. No acute bony abnormality identified. IMPRESSION: Diffuse bilateral pulmonary infiltrates. Electronically Signed   By: Marcello Moores  Register   On: 04/28/2019 11:33   DG Chest Port 1 View  Result Date: 04/26/2019 CLINICAL DATA:  Fever and body aches. EXAM: PORTABLE CHEST 1 VIEW COMPARISON:  Chest radiograph 03/31/2018 FINDINGS: The heart size and mediastinal contours are within normal limits. Chronic coarse bilateral interstitial markings. No new focal consolidation. No pneumothorax or significant pleural effusion. No acute finding in the visualized skeleton. IMPRESSION: No acute cardiopulmonary abnormality.  Chronic bronchitic changes. Electronically Signed   By: Audie Pinto M.D.   On: 04/26/2019 18:30   DG Abd Portable 2V  Result Date: 04/29/2019 CLINICAL DATA:  Abdominal pain, fever, chills EXAM: PORTABLE ABDOMEN - 2 VIEW COMPARISON:  03/18/2015 FINDINGS: Prior cholecystectomy. Contrast seen within the colon. Mild gaseous distention of bowel diffusely. No organomegaly or free air. IMPRESSION: Mild diffuse gaseous distention of bowel may reflect mild ileus. Electronically Signed   By: Rolm Baptise M.D.   On: 04/29/2019 02:52    Time Spent in minutes 25   Makoto Sellitto M.D on 04/29/2019 at 8:58 AM  Between 7am to 7pm - Pager - [phone]  After 7pm go to [email or web address] - password [facility]  [facility] -  Office  [phone]

## 2019-04-29 NOTE — Progress Notes (Signed)
Patient aware of transfer to CCU, she called her family and told them. Called report to CCU.

## 2019-04-29 NOTE — Consult Note (Addendum)
Medical Consultation   Vanessa Baker  NKN:[id number]  DOB: 1975/04/28  DOA: 04/26/2019  PCP: Mikey College, NP (Inactive)    Requesting physician:Dr Christian Mate  Reason for consultation: hypoxia   History of Present Illness: Vanessa Baker is an 44 y.o. female history of diabetes and asthma, who underwent laparoscopic cholecystectomy for acute cellulitis by her who I was consulted on for hypoxia.  Patient denies chest pain and has had no fever or chills or cough.  She does not feel like she is wheezing.  Her abdominal pain has been stable since her surgery.  Denies nausea or vomiting postoperatively.  Patient was noted to be hypoxic earlier in the day and has had increasing need for oxygen.  CTA chest was done earlier in the day to evaluate for acute PE.  The result was unavailable until late at night when it showed no PE but did show tensive diffuse bilateral airspace disease with upper and midlung predominance, likely reflecting infection or ARDS.  ABG done at the time of consultation on 5 L O2 via nasal cannula shows pH of 7.47 PCO2 of 44 and PO2 of 60.  Repeat blood testing, CBC, CMP, lactic acid, INR, PT chest x-ray and abdominal x-rays pending at the time of decision to transfer to the ICU concern for ARDS given increasing O2 requirement.  Review of Systems:  Review of Systems  All other systems reviewed and are negative.  As per HPI otherwise 10 point review of systems negative.   Past Medical History: Past Medical History:  Diagnosis Date  . Allergy   . Anxiety   . Asthma   . Bipolar disorder ([place])   . Bursitis   . Depression   . Diabetes mellitus without complication ([place])   . Essential hypertension 03/28/2017  . Frequent headaches   . GERD (gastroesophageal reflux disease)   . Headache   . High cholesterol   . Hypertension   . Irritable bowel syndrome (IBS)   . Irritable colon 02/23/2010  . Migraine 04/02/2017  . Obesity (BMI  30-39.9) 11/25/2013  . Seizure ([place]) [phone]  . Seizures ([place])   . Tremor     Past Surgical History: Past Surgical History:  Procedure Laterality Date  . CHOLECYSTECTOMY N/A 04/27/2019   Procedure: LAPAROSCOPIC CHOLECYSTECTOMY;  Surgeon: Fredirick Maudlin, MD;  Location: [facility] ORS;  Service: General;  Laterality: N/A;  . NO PAST SURGERIES       Allergies:   Allergies  Allergen Reactions  . Amitriptyline Other (See Comments)    Confusion, paralysis     Social History:  reports that she has been smoking cigarettes. She has a 13.50 pack-year smoking history. She has never used smokeless tobacco. She reports previous alcohol use. She reports that she does not use drugs.   Family History: Family History  Problem Relation Age of Onset  . Bipolar disorder Mother   . Schizophrenia Mother   . Hypertension Mother   . Diabetes Mother   . Cancer Mother   . Anxiety disorder Mother   . ADD / ADHD Mother   . Alcohol abuse Mother   . Drug abuse Mother   . Bipolar disorder Sister   . Anxiety disorder Sister   . Drug abuse Sister   . Bipolar disorder Brother   . Anxiety disorder Brother   . Drug abuse Brother   . Hypertension Father   . Cancer Father   .  Breast cancer Maternal Aunt   . Breast cancer Paternal Aunt   . Breast cancer Maternal Grandmother   . Alcohol abuse Maternal Grandmother   . Breast cancer Paternal Grandmother   . Parkinson's disease Maternal Grandfather   . Alcohol abuse Maternal Grandfather   . Alcohol abuse Maternal Uncle   . ADD / ADHD Son   . Tremor Neg Hx        Physical Exam: Vitals:   04/29/19 0114 04/29/19 0130 04/29/19 0200 04/29/19 0236  BP: (!) 99/47  (!) 106/54   Pulse: (!) 101 (!) 116 98   Resp:   (!) 22   Temp: 98.7 F (37.1 C)  97.7 F (36.5 C)   TempSrc: Oral  Axillary   SpO2: (!) 88% (!) 89% 91% 91%  Weight:      Height:        Constitutional: Appearance,  Alert and awake, oriented x3, in mild respiratory distress. Eyes:  PERLA, EOMI, irises appear normal, anicteric sclera,  ENMT: external ears and nose appear normal, normal hearing            Lips appears normal, oropharynx mucosa, tongue, posterior pharynx appear normal  Neck: neck appears normal, no masses, normal ROM, no thyromegaly, no JVD  CVS: S1-S2 clear, no murmur rubs or gallops, no LE edema, normal pedal pulses  Respiratory: Bilateral wheezes and crackles.  Respiratory effort slightly increased  abdomen: Postoperative wounds with clean bandages.  Drain and right upper quadrant with serosanguineous fluid. Musculoskeletal: : no cyanosis, clubbing or edema noted bilaterally                       Joint/bones/muscle exam, strength, contractures or atrophy Neuro: Cranial nerves II-XII intact, strength, sensation, reflexes Psych: judgement and insight appear normal, stable mood and affect, mental status Skin: no rashes or lesions or ulcers, no induration or nodules   Data reviewed:  I have personally reviewed following labs and imaging studies Labs:  CBC: Recent Labs  Lab 04/26/19 1724 04/27/19 0614 04/28/19 0514 04/29/19 0203  WBC 17.8* 12.0* 12.5* 13.9*  NEUTROABS 14.3*  --   --   --   HGB 13.7 12.3 11.7* 12.0  HCT 43.0 38.1 36.6 37.3  MCV 91.9 91.8 91.5 91.2  PLT 356 312 311 956    Basic Metabolic Panel: Recent Labs  Lab 04/26/19 1724 04/26/19 1724 04/27/19 0614 04/28/19 0514  NA 130*  --  141 141  K 3.9   < > 3.7 4.0  CL 96*  --  107 109  CO2 23  --  25 25  GLUCOSE 269*  --  118* 135*  BUN 5*  --  6 5*  CREATININE 0.81  --  0.62 0.68  CALCIUM 8.9  --  8.4* 8.0*  MG  --   --   --  2.3  PHOS  --   --  4.5 3.6   < > = values in this interval not displayed.   GFR Estimated Creatinine Clearance: 87.6 mL/min (by C-G formula based on SCr of 0.68 mg/dL). Liver Function Tests: Recent Labs  Lab 04/26/19 1724 04/27/19 0614 04/28/19 0514  AST 19 19 40  ALT 21 16 24   ALKPHOS 120 97 97  BILITOT 0.6 0.4 0.3  PROT 7.7 6.3* 6.1*    ALBUMIN 3.4* 2.6* 2.5*   Recent Labs  Lab 04/26/19 1825  LIPASE 26   No results for input(s): AMMONIA in the last 168 hours. Coagulation profile Recent  Labs  Lab 04/26/19 1724 04/29/19 0203  INR 1.0 1.1    Cardiac Enzymes: No results for input(s): CKTOTAL, CKMB, CKMBINDEX, TROPONINI in the last 168 hours. BNP: Invalid input(s): POCBNP CBG: Recent Labs  Lab 04/28/19 0800 04/28/19 1145 04/28/19 1618 04/28/19 2046 04/28/19 2348  GLUCAP 111* 223* 126* 153* 147*   D-Dimer No results for input(s): DDIMER in the last 72 hours. Hgb A1c Recent Labs    04/27/19 0614  HGBA1C 6.1*   Lipid Profile No results for input(s): CHOL, HDL, LDLCALC, TRIG, CHOLHDL, LDLDIRECT in the last 72 hours. Thyroid function studies No results for input(s): TSH, T4TOTAL, T3FREE, THYROIDAB in the last 72 hours.  Invalid input(s): FREET3 Anemia work up No results for input(s): VITAMINB12, FOLATE, FERRITIN, TIBC, IRON, RETICCTPCT in the last 72 hours. Urinalysis    Component Value Date/Time   COLORURINE YELLOW (A) 04/26/2019 1720   APPEARANCEUR CLEAR (A) 04/26/2019 1720   APPEARANCEUR Hazy 06/11/2013 1412   LABSPEC 1.006 04/26/2019 1720   LABSPEC 1.024 06/11/2013 1412   PHURINE 7.0 04/26/2019 1720   GLUCOSEU 50 (A) 04/26/2019 1720   GLUCOSEU Negative 06/11/2013 1412   HGBUR NEGATIVE 04/26/2019 1720   BILIRUBINUR NEGATIVE 04/26/2019 1720   BILIRUBINUR neg 07/25/2018 1001   BILIRUBINUR Negative 06/11/2013 1412   KETONESUR NEGATIVE 04/26/2019 1720   PROTEINUR NEGATIVE 04/26/2019 1720   UROBILINOGEN 0.2 07/25/2018 1001   NITRITE NEGATIVE 04/26/2019 1720   LEUKOCYTESUR NEGATIVE 04/26/2019 1720   LEUKOCYTESUR Negative 06/11/2013 1412     Microbiology Recent Results (from the past 240 hour(s))  Culture, blood (Routine x 2)     Status: None (Preliminary result)   Collection Time: 04/26/19  5:24 PM   Specimen: BLOOD  Result Value Ref Range Status   Specimen Description BLOOD LAL   Final   Special Requests   Final    BOTTLES DRAWN AEROBIC AND ANAEROBIC Blood Culture adequate volume   Culture   Final    NO GROWTH 2 DAYS Performed at [facility], [address] [place], [place] [address]    Report Status PENDING  Incomplete  Urine Culture     Status: Abnormal   Collection Time: 04/26/19  5:26 PM   Specimen: Urine, Random  Result Value Ref Range Status   Specimen Description   Final    URINE, RANDOM Performed at [facility], [address], [place], [place] [address]    Special Requests   Final    NONE Performed at [facility], [address] [place], [place] [address]    Culture MULTIPLE SPECIES PRESENT, SUGGEST RECOLLECTION (A)  Final   Report Status 04/27/2019 FINAL  Final  Culture, blood (Routine x 2)     Status: None (Preliminary result)   Collection Time: 04/26/19  6:26 PM   Specimen: BLOOD  Result Value Ref Range Status   Specimen Description BLOOD RFA  Final   Special Requests   Final    BOTTLES DRAWN AEROBIC AND ANAEROBIC Blood Culture adequate volume   Culture   Final    NO GROWTH 2 DAYS Performed at [facility], [address] [place],  [address]    Report Status PENDING  Incomplete  MRSA PCR Screening     Status: None   Collection Time: 04/27/19  7:30 AM   Specimen: Nasal Mucosa; Nasopharyngeal  Result Value Ref Range Status   MRSA by PCR NEGATIVE NEGATIVE Final    Comment:        The GeneXpert MRSA  Assay (FDA approved for NASAL specimens only), is one component of a comprehensive MRSA colonization surveillance program. It is not intended to diagnose MRSA infection nor to guide or monitor treatment for MRSA infections. Performed at [facility], [address] [place], [place] [address]   Aerobic/Anaerobic Culture (surgical/deep wound)     Status: None (Preliminary result)   Collection Time: 04/27/19 11:34 AM   Specimen: PATH Gallbladder; Body Fluid  Result Value Ref  Range Status   Specimen Description   Final    GALL BLADDER Performed at [facility], [address], [place], [place] [address]    Special Requests   Final    NONE Performed at [facility], [place], [place] [address]    Gram Stain   Final    ABUNDANT WBC PRESENT, PREDOMINANTLY PMN ABUNDANT GRAM NEGATIVE RODS MODERATE GRAM POSITIVE COCCI    Culture   Final    ABUNDANT CITROBACTER BRAAKII CULTURE REINCUBATED FOR BETTER GROWTH Performed at [facility], [place] [address] [place], [place] [address]    Report Status PENDING  Incomplete       Inpatient Medications:   Scheduled Meds: . acetaminophen  1,000 mg Oral Q6H  . benztropine  1 mg Oral TID  . enoxaparin (LOVENOX) injection  40 mg Subcutaneous Q24H  . fluticasone furoate-vilanterol  1 puff Inhalation Daily  . gabapentin  300 mg Oral BID  . insulin aspart  0-15 Units Subcutaneous Q4H  . lithium carbonate  300 mg Oral BID WC  . norethindrone-ethinyl estradiol  1 tablet Oral Daily  . OLANZapine  15 mg Oral QHS  . pantoprazole  40 mg Oral Daily  . pneumococcal 23 valent vaccine  0.5 mL Intramuscular Tomorrow-1000  . primidone  50 mg Oral QHS  . sertraline  100 mg Oral Daily  . traZODone  150 mg Oral QHS  . umeclidinium bromide  1 puff Inhalation Daily   Continuous Infusions: . sodium chloride 20 mL/hr at 04/28/19 2300  . sodium chloride Stopped (04/27/19 1136)  . piperacillin-tazobactam (ZOSYN)  IV 3.375 g (04/28/19 2154)     Radiological Exams on Admission: CT ANGIO CHEST PE W OR WO CONTRAST  Result Date: 04/29/2019 CLINICAL DATA:  Shortness of breath.  Acute respiratory distress. EXAM: CT ANGIOGRAPHY CHEST WITH CONTRAST TECHNIQUE: Multidetector CT imaging of the chest was performed using the standard protocol during bolus administration of intravenous contrast. Multiplanar CT image reconstructions and MIPs were obtained to evaluate the vascular anatomy. CONTRAST:  67mL  OMNIPAQUE IOHEXOL 350 MG/ML SOLN COMPARISON:  Chest x-ray today. FINDINGS: Cardiovascular: No filling defects in the pulmonary arteries to suggest pulmonary emboli. Heart is normal size. Aorta is normal caliber. Mediastinum/Nodes: No mediastinal, hilar, or axillary adenopathy. Trachea and esophagus are unremarkable. Thyroid unremarkable. Lungs/Pleura: Extensive diffuse ground-glass airspace opacities within the lungs, most pronounced in the upper and mid lung zones. No pleural effusion or pneumothorax. Upper Abdomen: Fatty infiltration of the liver. Prior cholecystectomy. Musculoskeletal: Chest wall soft tissues are unremarkable. No acute bony abnormality. Review of the MIP images confirms the above findings. IMPRESSION: No evidence of pulmonary embolus. Extensive diffuse bilateral airspace disease with upper and mid lung predominance. This likely reflects infection or ARDS. Electronically Signed   By: Rolm Baptise M.D.   On: 04/29/2019 01:28   DG Chest Port 1 View  Result Date: 04/29/2019 CLINICAL DATA:  Fever, chills EXAM: PORTABLE CHEST 1 VIEW COMPARISON:  03/31/2019 FINDINGS: Extensive bilateral airspace disease again  noted, unchanged. Heart is borderline in size. No effusions or pneumothorax. No acute bony abnormality. IMPRESSION: Diffuse bilateral airspace disease, unchanged. Electronically Signed   By: Rolm Baptise M.D.   On: 04/29/2019 02:06   DG Chest Port 1 View  Result Date: 04/28/2019 CLINICAL DATA:  Shortness of breath. EXAM: PORTABLE CHEST 1 VIEW COMPARISON:  04/26/2019. FINDINGS: Mediastinum and hilar structures normal. Heart size normal. Diffuse bilateral pulmonary infiltrates noted. No pleural effusion or pneumothorax. No acute bony abnormality identified. IMPRESSION: Diffuse bilateral pulmonary infiltrates. Electronically Signed   By: Marcello Moores  Register   On: 04/28/2019 11:33    Impression/Recommendations Principal Problem:   Acute respiratory distress syndrome (ARDS)    Pneumonia,  bilateral -CTA chest concerning for ARDS versus infection -ABG on 5 L with PO2 of 60.  pH 7.47, PCO2 44 --Continue oxygen supplementation to maintain sats over 92 -Serial ABGs --Rocephin and Azithromycin. Covid test reportedly negative -Transferred to the ICU -Follow-up results of pending tests   Elevated Troponin --Likely due to demand ischemia. No chest pain and no acute EKG changes    S/P laparoscopic cholecystectomy   Cholecystitis, acute -Continued management per surgery    Essential hypertension -Blood pressure controlled    Moderate persistent asthma without complication -Not acutely exacerbated -As needed bronchodilator treatments    Type 2 diabetes mellitus with other specified complication ([place]) -Continue sliding scale insulin     Thank you for this consultation.  Our [facility] hospitalist team will follow the patient with you.   Time Spent: 56 min  Athena Masse M.D. [facility] 04/29/2019, 2:37 AM

## 2019-04-29 NOTE — Progress Notes (Signed)
*  PRELIMINARY RESULTS* Echocardiogram 2D Echocardiogram has been performed.  Vanessa Baker Vanessa Baker 04/29/2019, 11:52 AM

## 2019-04-30 ENCOUNTER — Inpatient Hospital Stay: Payer: Medicare HMO

## 2019-04-30 ENCOUNTER — Ambulatory Visit: Payer: Self-pay

## 2019-04-30 DIAGNOSIS — Z720 Tobacco use: Secondary | ICD-10-CM

## 2019-04-30 LAB — ECHOCARDIOGRAM COMPLETE
Height: 62 in
Weight: 2860.69 oz

## 2019-04-30 LAB — CBC
HCT: 35.3 % — ABNORMAL LOW (ref 36.0–46.0)
Hemoglobin: 11.2 g/dL — ABNORMAL LOW (ref 12.0–15.0)
MCH: 29 pg (ref 26.0–34.0)
MCHC: 31.7 g/dL (ref 30.0–36.0)
MCV: 91.5 fL (ref 80.0–100.0)
Platelets: 371 10*3/uL (ref 150–400)
RBC: 3.86 MIL/uL — ABNORMAL LOW (ref 3.87–5.11)
RDW: 13.6 % (ref 11.5–15.5)
WBC: 15.8 10*3/uL — ABNORMAL HIGH (ref 4.0–10.5)
nRBC: 0 % (ref 0.0–0.2)

## 2019-04-30 LAB — BASIC METABOLIC PANEL
Anion gap: 10 (ref 5–15)
BUN: 12 mg/dL (ref 6–20)
CO2: 31 mmol/L (ref 22–32)
Calcium: 8.5 mg/dL — ABNORMAL LOW (ref 8.9–10.3)
Chloride: 98 mmol/L (ref 98–111)
Creatinine, Ser: 0.74 mg/dL (ref 0.44–1.00)
GFR calc Af Amer: >60 mL/min (ref >60)
GFR calc non Af Amer: >60 mL/min (ref >60)
Glucose, Bld: 211 mg/dL — ABNORMAL HIGH (ref 70–99)
Potassium: 3.5 mmol/L (ref 3.5–5.1)
Sodium: 139 mmol/L (ref 135–145)

## 2019-04-30 LAB — GLUCOSE, CAPILLARY
Glucose-Capillary: 189 mg/dL — ABNORMAL HIGH (ref 70–99)
Glucose-Capillary: 178 mg/dL — ABNORMAL HIGH (ref 70–99)
Glucose-Capillary: 274 mg/dL — ABNORMAL HIGH (ref 70–99)
Glucose-Capillary: 322 mg/dL — ABNORMAL HIGH (ref 70–99)
Glucose-Capillary: 144 mg/dL — ABNORMAL HIGH (ref 70–99)

## 2019-04-30 LAB — BRAIN NATRIURETIC PEPTIDE: B Natriuretic Peptide: 123 pg/mL — ABNORMAL HIGH (ref 0.0–100.0)

## 2019-04-30 LAB — MAGNESIUM: Magnesium: 2.7 mg/dL — ABNORMAL HIGH (ref 1.7–2.4)

## 2019-04-30 LAB — PHOSPHORUS: Phosphorus: 5.8 mg/dL — ABNORMAL HIGH (ref 2.5–4.6)

## 2019-04-30 LAB — PROCALCITONIN: Procalcitonin: 0.65 ng/mL

## 2019-04-30 MED ORDER — INSULIN ASPART 100 UNIT/ML ~~LOC~~ SOLN: 3.0000 [IU] | Freq: Three times a day (TID) | SUBCUTANEOUS | Status: DC

## 2019-04-30 MED ORDER — INSULIN ASPART 100 UNIT/ML ~~LOC~~ SOLN
0.0000 [IU] | Freq: Three times a day (TID) | SUBCUTANEOUS | Status: DC
  Administered 2019-04-30: 11 [IU] via SUBCUTANEOUS
  Administered 2019-04-30: 8 [IU] via SUBCUTANEOUS
  Administered 2019-05-01: 3 [IU] via SUBCUTANEOUS
  Administered 2019-05-01 (×2): 2 [IU] via SUBCUTANEOUS
  Administered 2019-05-02: 3 [IU] via SUBCUTANEOUS
  Administered 2019-05-02: 2 [IU] via SUBCUTANEOUS
  Filled 2019-04-30 (×7): qty 1

## 2019-04-30 MED ORDER — POTASSIUM CHLORIDE CRYS ER 20 MEQ PO TBCR
40.0000 meq | EXTENDED_RELEASE_TABLET | Freq: Once | ORAL | Status: AC
  Administered 2019-04-30: 40 meq via ORAL
  Filled 2019-04-30: qty 2

## 2019-04-30 MED ORDER — GUAIFENESIN-DM 100-10 MG/5ML PO SYRP
15.0000 mL | ORAL_SOLUTION | ORAL | Status: DC | PRN
  Administered 2019-04-30 – 2019-05-02 (×3): 15 mL via ORAL
  Filled 2019-04-30 (×2): qty 15

## 2019-04-30 MED ORDER — INSULIN ASPART 100 UNIT/ML ~~LOC~~ SOLN
0.0000 [IU] | Freq: Every day | SUBCUTANEOUS | Status: DC
  Filled 2019-04-30: qty 1

## 2019-04-30 MED ORDER — INSULIN GLARGINE 100 UNIT/ML ~~LOC~~ SOLN
5.0000 [IU] | Freq: Every day | SUBCUTANEOUS | Status: DC
  Administered 2019-04-30 – 2019-05-02 (×3): 5 [IU] via SUBCUTANEOUS
  Filled 2019-04-30 (×4): qty 0.05

## 2019-04-30 MED ORDER — IPRATROPIUM-ALBUTEROL 0.5-2.5 (3) MG/3ML IN SOLN
3.0000 mL | Freq: Four times a day (QID) | RESPIRATORY_TRACT | Status: DC
  Administered 2019-04-30 – 2019-05-02 (×6): 3 mL via RESPIRATORY_TRACT
  Filled 2019-04-30 (×7): qty 3

## 2019-04-30 MED ORDER — NICOTINE 21 MG/24HR TD PT24
21.0000 mg | MEDICATED_PATCH | Freq: Every day | TRANSDERMAL | Status: DC
  Administered 2019-04-30 – 2019-05-02 (×3): 21 mg via TRANSDERMAL
  Filled 2019-04-30 (×3): qty 1

## 2019-04-30 MED ORDER — AZITHROMYCIN 500 MG PO TABS: 500.0000 mg | ORAL_TABLET | Freq: Every day | ORAL | Status: DC

## 2019-04-30 MED ORDER — AZITHROMYCIN 500 MG PO TABS
500.0000 mg | ORAL_TABLET | Freq: Every day | ORAL | Status: DC
  Administered 2019-05-01 – 2019-05-02 (×2): 500 mg via ORAL
  Filled 2019-04-30 (×2): qty 1

## 2019-04-30 MED ORDER — INSULIN ASPART 100 UNIT/ML ~~LOC~~ SOLN
3.0000 [IU] | Freq: Three times a day (TID) | SUBCUTANEOUS | Status: DC
  Administered 2019-04-30 – 2019-05-02 (×6): 3 [IU] via SUBCUTANEOUS
  Filled 2019-04-30 (×6): qty 1

## 2019-04-30 MED ORDER — BUDESONIDE 0.5 MG/2ML IN SUSP
0.5000 mg | Freq: Two times a day (BID) | RESPIRATORY_TRACT | Status: DC
  Administered 2019-04-30 – 2019-05-02 (×4): 0.5 mg via RESPIRATORY_TRACT
  Filled 2019-04-30 (×4): qty 2

## 2019-04-30 NOTE — Chronic Care Management (AMB) (Signed)
  Care Management   Follow Up Note   04/30/2019 Name: Vanessa Baker MRN: [id number] DOB: 10-12-1975  Referred by: Mikey College, NP (Inactive) Reason for referral : Vanessa Baker is a 44 y.o. year old female who is a primary care patient of Mikey College, NP (Inactive). The care management team was consulted for assistance with care management and care coordination needs.    Review of patient status, including review of consultants reports, relevant laboratory and other test results, and collaboration with appropriate care team members and the patient's provider was performed as part of comprehensive patient evaluation and provision of chronic care management services.    LCSW completed CCM outreach attempt today but was unable to reach patient successfully. A HIPPA compliant voice message was left encouraging patient to return call once available. LCSW rescheduled CCM SW appointment as well.  A HIPPA compliant phone message was left for the patient providing contact information and requesting a return call.   Eula Fried, BSW, MSW, [email or web address] Phone: [phone]

## 2019-04-30 NOTE — Progress Notes (Signed)
PROGRESS NOTE                                                                                                                                                                                                             Patient Demographics:    Vanessa Baker, is a 44 y.o. female, DOB - [birth year], VVZ:[id number]  Admit date - 04/26/2019   Admitting Physician Fredirick Maudlin, MD  Outpatient Primary MD for the patient is Mikey College, NP (Inactive)  LOS - 2    Chief Complaint  Patient presents with  . Fever  . Abdominal Pain       Brief Narrative  44 y/o obese femalewith DM and controled asthma who was admitted for lap choly on 3/23 but went into acute respiratory failure with hypoxia. CT angio chest negative for PE but showed diffuse infiltrate vs ARDS. Transferred to ICU.  Hospitalist consulted for medical management.  Transferred to our service as primary consult given no further surgical issues.   Subjective:   Complains of abdominal soreness.  Requiring 9 L via nasal cannula.  Denies any shortness of breath.  Reports having craving for cigarette.      Principal Problem:   ARDS (adult respiratory distress syndrome) (HCC) acute respiratory failure with hypoxemia ( HCC) Secondary to multifocal pneumonia +/- acute pulmonary edema. Continue SDU monitoring.  Empiric IV Zosyn (given Citrobacter in blood culture) and azithromycin. Continue IV Solu-Medrol and scheduled DuoNeb.  No signs of volume overload. 2D echo with normal EF and no wall motion abnormality. Bedside spirometry.   Active Problems:    Type 2 diabetes mellitus with other specified complication (HCC) Monitor on SSI. A1C of 6.  CBG mildly elevated with IV Solu-Medrol.  Added bedtime Lantus and low-dose Premeal aspart.    Cholecystitis, acute   S/P laparoscopic cholecystectomy Percutaneous drain removed by surgery today.  Pain control as  needed.  Surgery following peripherally and will arrange outpatient follow-up.  Elevated troponin  suspect demand ischemia. No chest pain symptoms or ekg changes.  2D echo unremarkable.  Moderate persistent asthma Wheezing improved with IV Solu-Medrol and nebs.  Essential HTN  BP stable.  Tobacco use Counseled on cessation.  Nicotine patch ordered.  Code Status : full code  Family Communication  : none at bedside  Disposition Plan  : Home once respiratory function  improved.  Continue stepdown monitoring given high oxygen requirement.  Barriers For Discharge : active symptoms, hypoxic resp failuire    Procedures  :lap choly, CTA, echo  DVT Prophylaxis  :  Lovenox -   Lab Results  Component Value Date   PLT 371 04/30/2019    Antibiotics  :   Anti-infectives (From admission, onward)   Start     Dose/Rate Route Frequency Ordered Stop   05/01/19 1000  azithromycin (ZITHROMAX) tablet 500 mg  Status:  Discontinued     500 mg Oral Daily 04/30/19 0905 04/30/19 1329   05/01/19 1000  azithromycin (ZITHROMAX) tablet 500 mg     500 mg Oral Daily 04/30/19 1329 05/04/19 0959   04/29/19 1000  piperacillin-tazobactam (ZOSYN) IVPB 3.375 g     3.375 g 12.5 mL/hr over 240 Minutes Intravenous Every 8 hours 04/29/19 0949     04/29/19 0415  cefTRIAXone (ROCEPHIN) 1 g in sodium chloride 0.9 % 100 mL IVPB  Status:  Discontinued     1 g 200 mL/hr over 30 Minutes Intravenous Every 24 hours 04/29/19 0404 04/29/19 0949   04/29/19 0415  azithromycin (ZITHROMAX) 500 mg in sodium chloride 0.9 % 250 mL IVPB  Status:  Discontinued     500 mg 250 mL/hr over 60 Minutes Intravenous Every 24 hours 04/29/19 0404 04/30/19 0905   04/27/19 0600  piperacillin-tazobactam (ZOSYN) IVPB 3.375 g  Status:  Discontinued     3.375 g 12.5 mL/hr over 240 Minutes Intravenous Every 8 hours 04/26/19 2052 04/29/19 0434   04/26/19 2030  piperacillin-tazobactam (ZOSYN) IVPB 3.375 g     3.375 g 100 mL/hr over 30 Minutes  Intravenous  Once 04/26/19 2024 04/26/19 2109        Objective:   Vitals:   04/30/19 1149 04/30/19 1230 04/30/19 1300 04/30/19 1400  BP:  93/67 (!) 111/59 (!) 119/54  Pulse:  87 89 78  Resp:  19 16 20   Temp:      TempSrc:      SpO2: 92% 90% (!) 89% 92%  Weight:      Height:        Wt Readings from Last 3 Encounters:  04/30/19 82.6 kg  03/19/19 82.6 kg  03/16/19 83.5 kg     Intake/Output Summary (Last 24 hours) at 04/30/2019 1516 Last data filed at 04/30/2019 1400 Gross per 24 hour  Intake 1213.09 ml  Output 2005 ml  Net -791.91 ml    Physical exam Not in distress, restless HEENT: Moist mucosa, supple neck Chest: Clear to auscultation bilaterally, no added sound CVs: Normal S1-S2, no murmurs GI: Soft, nondistended, nontender, right upper quadrant drain in place, bowel sounds present Musculoskeletal: Warm, no edema       Data Review:    CBC Recent Labs  Lab 04/26/19 1724 04/27/19 0614 04/28/19 0514 04/29/19 0203 04/30/19 0621  WBC 17.8* 12.0* 12.5* 13.9* 15.8*  HGB 13.7 12.3 11.7* 12.0 11.2*  HCT 43.0 38.1 36.6 37.3 35.3*  PLT 356 312 311 358 371  MCV 91.9 91.8 91.5 91.2 91.5  MCH 29.3 29.6 29.3 29.3 29.0  MCHC 31.9 32.3 32.0 32.2 31.7  RDW 13.1 13.3 13.5 13.8 13.6  LYMPHSABS 1.7  --   --   --   --   MONOABS 1.1*  --   --   --   --   EOSABS 0.2  --   --   --   --   BASOSABS 0.2*  --   --   --   --  Chemistries  Recent Labs  Lab 04/26/19 1724 04/27/19 1638 04/28/19 0514 04/29/19 0203 04/30/19 0621  NA 130* 141 141 139 139  K 3.9 3.7 4.0 3.5 3.5  CL 96* 107 109 100 98  CO2 23 25 25 28 31   GLUCOSE 269* 118* 135* 135* 211*  BUN 5* 6 5* 6 12  CREATININE 0.81 0.62 0.68 0.65 0.74  CALCIUM 8.9 8.4* 8.0* 8.2* 8.5*  MG  --   --  2.3  --  2.7*  AST 19 19 40 37  --   ALT 21 16 24 23   --   ALKPHOS 120 97 97 115  --   BILITOT 0.6 0.4 0.3 0.5  --     ------------------------------------------------------------------------------------------------------------------ No results for input(s): CHOL, HDL, LDLCALC, TRIG, CHOLHDL, LDLDIRECT in the last 72 hours.  Lab Results  Component Value Date   HGBA1C 6.1 (H) 04/27/2019   ------------------------------------------------------------------------------------------------------------------ No results for input(s): TSH, T4TOTAL, T3FREE, THYROIDAB in the last 72 hours.  Invalid input(s): FREET3 ------------------------------------------------------------------------------------------------------------------ No results for input(s): VITAMINB12, FOLATE, FERRITIN, TIBC, IRON, RETICCTPCT in the last 72 hours.  Coagulation profile Recent Labs  Lab 04/26/19 1724 04/29/19 0203  INR 1.0 1.1    No results for input(s): DDIMER in the last 72 hours.  Cardiac Enzymes No results for input(s): CKMB, TROPONINI, MYOGLOBIN in the last 168 hours.  Invalid input(s): CK ------------------------------------------------------------------------------------------------------------------    Component Value Date/Time   BNP 123.0 (H) 04/30/2019 4536    Inpatient Medications  Scheduled Meds: . acetaminophen  1,000 mg Oral Q6H  . [START ON 05/01/2019] azithromycin  500 mg Oral Daily  . benztropine  1 mg Oral TID  . budesonide (PULMICORT) nebulizer solution  0.5 mg Nebulization BID  . Chlorhexidine Gluconate Cloth  6 each Topical Daily  . enoxaparin (LOVENOX) injection  40 mg Subcutaneous Q24H  . gabapentin  300 mg Oral BID  . insulin aspart  0-15 Units Subcutaneous TID WC  . insulin aspart  0-5 Units Subcutaneous QHS  . insulin aspart  3 Units Subcutaneous TID WC  . insulin glargine  5 Units Subcutaneous Daily  . ipratropium-albuterol  3 mL Nebulization Q4H  . lithium carbonate  300 mg Oral BID WC  . methylPREDNISolone (SOLU-MEDROL) injection  40 mg Intravenous Q12H  . nicotine  21 mg Transdermal  Daily  . OLANZapine  15 mg Oral QHS  . pantoprazole  40 mg Oral Daily  . pneumococcal 23 valent vaccine  0.5 mL Intramuscular Tomorrow-1000  . primidone  50 mg Oral QHS  . sertraline  100 mg Oral Daily  . traZODone  150 mg Oral QHS   Continuous Infusions: . sodium chloride Stopped (04/27/19 1136)  . piperacillin-tazobactam (ZOSYN)  IV 12.5 mL/hr at 04/30/19 1400   PRN Meds:.sodium chloride, baclofen, HYDROcodone-acetaminophen, HYDROmorphone (DILAUDID) injection, ondansetron **OR** ondansetron (ZOFRAN) IV  Micro Results Recent Results (from the past 240 hour(s))  Culture, blood (Routine x 2)     Status: None (Preliminary result)   Collection Time: 04/26/19  5:24 PM   Specimen: BLOOD  Result Value Ref Range Status   Specimen Description BLOOD LAL  Final   Special Requests   Final    BOTTLES DRAWN AEROBIC AND ANAEROBIC Blood Culture adequate volume   Culture   Final    NO GROWTH 4 DAYS Performed at [facility], [address], [place], [place] [address]    Report Status PENDING  Incomplete  Urine Culture     Status: Abnormal   Collection Time:  04/26/19  5:26 PM   Specimen: Urine, Random  Result Value Ref Range Status   Specimen Description   Final    URINE, RANDOM Performed at [facility], [address] [place], [place] [address]    Special Requests   Final    NONE Performed at [facility], [address] [place], [place] [address]    Culture MULTIPLE SPECIES PRESENT, SUGGEST RECOLLECTION (A)  Final   Report Status 04/27/2019 FINAL  Final  Culture, blood (Routine x 2)     Status: None (Preliminary result)   Collection Time: 04/26/19  6:26 PM   Specimen: BLOOD  Result Value Ref Range Status   Specimen Description BLOOD RFA  Final   Special Requests   Final    BOTTLES DRAWN AEROBIC AND ANAEROBIC Blood Culture adequate volume   Culture   Final    NO GROWTH 4 DAYS Performed at [facility], [address] [place], [place]  [address]    Report Status PENDING  Incomplete  MRSA PCR Screening     Status: None   Collection Time: 04/27/19  7:30 AM   Specimen: Nasal Mucosa; Nasopharyngeal  Result Value Ref Range Status   MRSA by PCR NEGATIVE NEGATIVE Final    Comment:        The GeneXpert MRSA Assay (FDA approved for NASAL specimens only), is one component of a comprehensive MRSA colonization surveillance program. It is not intended to diagnose MRSA infection nor to guide or monitor treatment for MRSA infections. Performed at [facility], [address] [place], [place] [address]   Aerobic/Anaerobic Culture (surgical/deep wound)     Status: None (Preliminary result)   Collection Time: 04/27/19 11:34 AM   Specimen: PATH Gallbladder; Body Fluid  Result Value Ref Range Status   Specimen Description   Final    GALL BLADDER Performed at [facility], [address], [place], [place] [address]    Special Requests   Final    NONE Performed at [facility], [address] [place], [place] [address]    Gram Stain   Final    ABUNDANT WBC PRESENT, PREDOMINANTLY PMN ABUNDANT GRAM NEGATIVE RODS MODERATE GRAM POSITIVE COCCI    Culture   Final    ABUNDANT CITROBACTER BRAAKII MODERATE STAPHYLOCOCCUS AUREUS NO ANAEROBES ISOLATED; CULTURE IN PROGRESS FOR 5 DAYS SUSCEPTIBILITIES TO FOLLOW ORG 2 Performed at [facility], [place] [address] [place], [place] [address]    Report Status PENDING  Incomplete   Organism ID, Bacteria CITROBACTER BRAAKII  Final      Susceptibility   Citrobacter braakii - MIC*    CEFAZOLIN RESISTANT Resistant     CEFEPIME 1 SENSITIVE Sensitive     CEFTAZIDIME 4 SENSITIVE Sensitive     CEFTRIAXONE 1 SENSITIVE Sensitive     CIPROFLOXACIN <=0.25 SENSITIVE Sensitive     GENTAMICIN <=1 SENSITIVE Sensitive     IMIPENEM <=0.25 SENSITIVE Sensitive     TRIMETH/SULFA <=20 SENSITIVE Sensitive     PIP/TAZO <=4 SENSITIVE Sensitive     * ABUNDANT CITROBACTER BRAAKII     Radiology Reports CT ANGIO CHEST PE W OR WO CONTRAST  Result Date: 04/29/2019 CLINICAL DATA:  Shortness of breath.  Acute respiratory distress. EXAM: CT ANGIOGRAPHY CHEST WITH CONTRAST TECHNIQUE: Multidetector CT imaging of the chest was performed using the standard protocol during bolus administration of intravenous contrast. Multiplanar CT image reconstructions and MIPs were obtained to evaluate the vascular anatomy. CONTRAST:  71mL  OMNIPAQUE IOHEXOL 350 MG/ML SOLN COMPARISON:  Chest x-ray today. FINDINGS: Cardiovascular: No filling defects in the pulmonary arteries to suggest pulmonary emboli. Heart is normal size. Aorta is normal caliber. Mediastinum/Nodes: No mediastinal, hilar, or axillary adenopathy. Trachea and esophagus are unremarkable. Thyroid unremarkable. Lungs/Pleura: Extensive diffuse ground-glass airspace opacities within the lungs, most pronounced in the upper and mid lung zones. No pleural effusion or pneumothorax. Upper Abdomen: Fatty infiltration of the liver. Prior cholecystectomy. Musculoskeletal: Chest wall soft tissues are unremarkable. No acute bony abnormality. Review of the MIP images confirms the above findings. IMPRESSION: No evidence of pulmonary embolus. Extensive diffuse bilateral airspace disease with upper and mid lung predominance. This likely reflects infection or ARDS. Electronically Signed   By: Rolm Baptise M.D.   On: 04/29/2019 01:28   CT ABDOMEN PELVIS W CONTRAST  Result Date: 04/26/2019 CLINICAL DATA:  Abdominal pain, distention, vomiting EXAM: CT ABDOMEN AND PELVIS WITH CONTRAST TECHNIQUE: Multidetector CT imaging of the abdomen and pelvis was performed using the standard protocol following bolus administration of intravenous contrast. CONTRAST:  12mL OMNIPAQUE IOHEXOL 300 MG/ML  SOLN COMPARISON:  05/28/2017 FINDINGS: Lower chest: Lung bases are clear. No effusions. Heart is normal size. Hepatobiliary: Diffuse fatty infiltration of the liver. No focal hepatic  abnormality. Marked irregular gallbladder wall thickening. Stones are seen within the gallbladder. No biliary ductal dilatation. Surrounding inflammation. Findings compatible with acute cholecystitis. Pancreas: No focal abnormality or ductal dilatation. Spleen: No focal abnormality.  Normal size. Adrenals/Urinary Tract: No adrenal abnormality. No focal renal abnormality. No stones or hydronephrosis. Urinary bladder is unremarkable. Stomach/Bowel: Normal appendix. Stomach, large and small bowel grossly unremarkable. Vascular/Lymphatic: No evidence of aneurysm or adenopathy. Reproductive: Uterus and adnexa unremarkable.  No mass. Other: No free fluid or free air. Musculoskeletal: No acute bony abnormality. IMPRESSION: Cholelithiasis. Marked irregular gallbladder wall thickening with surrounding inflammation. Findings compatible with acute cholecystitis. Fatty infiltration of the liver. Electronically Signed   By: Rolm Baptise M.D.   On: 04/26/2019 20:28   DG Chest Port 1 View  Result Date: 04/30/2019 CLINICAL DATA:  ARDS EXAM: PORTABLE CHEST 1 VIEW COMPARISON:  April 29, 2019 FINDINGS: The heart size and mediastinal contours are unchanged. Again noted is multifocal bilateral patchy airspace opacities throughout both lungs. There is slight interval improvement in the aeration of the left lung. No pleural effusion. IMPRESSION: Bilateral multifocal airspace opacities, with slight interval improvement in the left lung. Electronically Signed   By: Prudencio Pair M.D.   On: 04/30/2019 05:09   DG Chest Port 1 View  Result Date: 04/29/2019 CLINICAL DATA:  Fever, chills EXAM: PORTABLE CHEST 1 VIEW COMPARISON:  03/31/2019 FINDINGS: Extensive bilateral airspace disease again noted, unchanged. Heart is borderline in size. No effusions or pneumothorax. No acute bony abnormality. IMPRESSION: Diffuse bilateral airspace disease, unchanged. Electronically Signed   By: Rolm Baptise M.D.   On: 04/29/2019 02:06   DG Chest Port 1  View  Result Date: 04/28/2019 CLINICAL DATA:  Shortness of breath. EXAM: PORTABLE CHEST 1 VIEW COMPARISON:  04/26/2019. FINDINGS: Mediastinum and hilar structures normal. Heart size normal. Diffuse bilateral pulmonary infiltrates noted. No pleural effusion or pneumothorax. No acute bony abnormality identified. IMPRESSION: Diffuse bilateral pulmonary infiltrates. Electronically Signed   By: Marcello Moores  Register   On: 04/28/2019 11:33   DG Chest Port 1 View  Result Date: 04/26/2019 CLINICAL DATA:  Fever and body aches. EXAM: PORTABLE CHEST 1 VIEW COMPARISON:  Chest radiograph 03/31/2018 FINDINGS: The heart size and mediastinal contours are within normal limits.  Chronic coarse bilateral interstitial markings. No new focal consolidation. No pneumothorax or significant pleural effusion. No acute finding in the visualized skeleton. IMPRESSION: No acute cardiopulmonary abnormality.  Chronic bronchitic changes. Electronically Signed   By: Audie Pinto M.D.   On: 04/26/2019 18:30   DG Abd Portable 2V  Result Date: 04/29/2019 CLINICAL DATA:  Abdominal pain, fever, chills EXAM: PORTABLE ABDOMEN - 2 VIEW COMPARISON:  03/18/2015 FINDINGS: Prior cholecystectomy. Contrast seen within the colon. Mild gaseous distention of bowel diffusely. No organomegaly or free air. IMPRESSION: Mild diffuse gaseous distention of bowel may reflect mild ileus. Electronically Signed   By: Rolm Baptise M.D.   On: 04/29/2019 02:52   ECHOCARDIOGRAM COMPLETE  Result Date: 04/30/2019    ECHOCARDIOGRAM REPORT   Patient Name:   Vanessa Baker Date of Exam: 04/29/2019 Medical Rec #:  [id number]              Height:       62.0 in Accession #:    [id number]             Weight:       178.8 lb Date of Birth:  [birth year]             BSA:          1.823 m Patient Age:    76 years               BP:           127/52 mmHg Patient Gender: F                      HR:           89 bpm. Exam Location:  [facility] Procedure: 2D Echo, Color Doppler, Cardiac  Doppler and Intracardiac            Opacification Agent Indications:     R06.89 Acute Respiratory Insufficiency  History:         Patient has no prior history of Echocardiogram examinations.                  Risk Factors:Hypertension, HCL and Diabetes. Seizures.  Sonographer:     Charmayne Sheer RDCS (AE) Referring Phys:  [place] Diagnosing Phys: Neoma Laming MD  Sonographer Comments: Suboptimal apical window and no subcostal window. IMPRESSIONS  1. Left ventricular ejection fraction, by estimation, is 60 to 65%. The left ventricle has normal function. The left ventricle has no regional wall motion abnormalities. Left ventricular diastolic function could not be evaluated.  2. Right ventricular systolic function is normal. The right ventricular size is normal.  3. The mitral valve is normal in structure. No evidence of mitral valve regurgitation. No evidence of mitral stenosis.  4. The aortic valve is normal in structure. Aortic valve regurgitation is trivial. No aortic stenosis is present.  5. The inferior vena cava is normal in size with greater than 50% respiratory variability, suggesting right atrial pressure of 3 mmHg. FINDINGS  Left Ventricle: Left ventricular ejection fraction, by estimation, is 60 to 65%. The left ventricle has normal function. The left ventricle has no regional wall motion abnormalities. The left ventricular internal cavity size was normal in size. There is  no left ventricular hypertrophy. Left ventricular diastolic function could not be evaluated. Right Ventricle: The right ventricular size is normal. No increase in right ventricular wall thickness. Right ventricular systolic function is normal. Left Atrium: Left atrial size was normal in size. Right  Atrium: Right atrial size was normal in size. Pericardium: There is no evidence of pericardial effusion. Mitral Valve: The mitral valve is normal in structure. Normal mobility of the mitral valve leaflets. No evidence of mitral valve  regurgitation. No evidence of mitral valve stenosis. MV peak gradient, 3.9 mmHg. The mean mitral valve gradient is 1.0 mmHg. Tricuspid Valve: The tricuspid valve is normal in structure. Tricuspid valve regurgitation is not demonstrated. No evidence of tricuspid stenosis. Aortic Valve: The aortic valve is normal in structure. Aortic valve regurgitation is trivial. No aortic stenosis is present. Aortic valve mean gradient measures 5.0 mmHg. Aortic valve peak gradient measures 12.7 mmHg. Aortic valve area, by VTI measures 2.52 cm. Pulmonic Valve: The pulmonic valve was normal in structure. Pulmonic valve regurgitation is not visualized. No evidence of pulmonic stenosis. Aorta: The aortic root is normal in size and structure. Venous: The inferior vena cava is normal in size with greater than 50% respiratory variability, suggesting right atrial pressure of 3 mmHg. IAS/Shunts: No atrial level shunt detected by color flow Doppler.  LEFT VENTRICLE PLAX 2D LVIDd:         4.81 cm  Diastology LVIDs:         3.29 cm  LV e' lateral:   14.10 cm/s LV PW:         0.86 cm  LV E/e' lateral: 5.9 LV IVS:        0.64 cm  LV e' medial:    11.40 cm/s LVOT diam:     1.90 cm  LV E/e' medial:  7.2 LV SV:         66 LV SV Index:   36 LVOT Area:     2.84 cm  LEFT ATRIUM           Index LA diam:      3.20 cm 1.76 cm/m LA Vol (A4C): 27.0 ml 14.81 ml/m  AORTIC VALVE                    PULMONIC VALVE AV Area (Vmax):    2.12 cm     PV Vmax:       1.22 m/s AV Area (Vmean):   2.33 cm     PV Vmean:      77.400 cm/s AV Area (VTI):     2.52 cm     PV VTI:        0.230 m AV Vmax:           178.00 cm/s  PV Peak grad:  6.0 mmHg AV Vmean:          104.000 cm/s PV Mean grad:  3.0 mmHg AV VTI:            0.261 m AV Peak Grad:      12.7 mmHg AV Mean Grad:      5.0 mmHg LVOT Vmax:         133.00 cm/s LVOT Vmean:        85.500 cm/s LVOT VTI:          0.232 m LVOT/AV VTI ratio: 0.89  AORTA Ao Root diam: 2.80 cm MITRAL VALVE MV Area (PHT): 4.60 cm     SHUNTS MV Peak grad:  3.9 mmHg    Systemic VTI:  0.23 m MV Mean grad:  1.0 mmHg    Systemic Diam: 1.90 cm MV Vmax:       0.99 m/s MV Vmean:      55.0 cm/s MV Decel Time: 165  msec MV E velocity: 82.50 cm/s MV A velocity: 68.90 cm/s MV E/A ratio:  1.20 Neoma Laming MD Electronically signed by Neoma Laming MD Signature Date/Time: 04/30/2019/9:35:48 AM    Final     Time Spent in minutes 35   Tatjana Turcott M.D on 04/30/2019 at 3:16 PM  Between 7am to 7pm - Pager - [phone]  After 7pm go to [email or web address] - password [facility]  [facility] -  Office  [phone]

## 2019-04-30 NOTE — TOC Initial Note (Signed)
Transition of Care [facility]) - Initial/Assessment Note    Patient Details  Name: Vanessa Baker MRN: [id number] Date of Birth: 1975/08/17  Transition of Care [facility]) CM/SW Contact:    Magnus Ivan, LCSW Phone Number: 04/30/2019, 11:33 AM  Clinical Narrative:             CSW met with patient at bedside. Patient reported she lives with her husband and drives herself to appointments. Patient sees Cassell Smiles for PCP. Patient uses Product/process development scientist on [employer] and denied issues with obtaining medications. Patient denied any DME, HH, or SNF. Patient was encouraged to reach out with any needs.    Expected Discharge Plan: Home/Self Care Barriers to Discharge: Continued Medical Work up   Patient Goals and CMS Choice        Expected Discharge Plan and Services Expected Discharge Plan: Home/Self Care       Living arrangements for the past 2 months: Single Family Home                                      Prior Living Arrangements/Services Living arrangements for the past 2 months: Single Family Home Lives with:: Spouse Patient language and need for interpreter reviewed:: Yes Do you feel safe going back to the place where you live?: Yes      Need for Family Participation in Patient Care: Yes (Comment) Care giver support system in place?: Yes (comment)   Criminal Activity/Legal Involvement Pertinent to Current Situation/Hospitalization: No - Comment as needed  Activities of Daily Living Home Assistive Devices/Equipment: None ADL Screening (condition at time of admission) Patient's cognitive ability adequate to safely complete daily activities?: Yes Is the patient deaf or have difficulty hearing?: No Does the patient have difficulty seeing, even when wearing glasses/contacts?: No Does the patient have difficulty concentrating, remembering, or making decisions?: No Patient able to express need for assistance with ADLs?: Yes Does the patient have difficulty  dressing or bathing?: No Independently performs ADLs?: Yes (appropriate for developmental age) Does the patient have difficulty walking or climbing stairs?: No Weakness of Legs: None Weakness of Arms/Hands: None  Permission Sought/Granted                  Emotional Assessment Appearance:: Appears stated age Attitude/Demeanor/Rapport: Engaged Affect (typically observed): Calm, Appropriate Orientation: : Oriented to Self, Oriented to Place, Oriented to  Time, Oriented to Situation Alcohol / Substance Use: Not Applicable Psych Involvement: No (comment)  Admission diagnosis:  Cholecystitis [K81.9] Cholecystitis, acute [K81.0] Sepsis ([place]) [A41.9] Patient Active Problem List   Diagnosis Date Noted  . ARDS (adult respiratory distress syndrome) ([place]) 04/29/2019  . S/P laparoscopic cholecystectomy 04/29/2019  . Bilateral pneumonia 04/29/2019  . Elevated troponin 04/29/2019  . Acute respiratory failure with hypoxia ([place])   . Cholecystitis, acute 04/26/2019  . High risk medication use 04/09/2019  . Drug-induced tremor 03/25/2019  . Lymphocytic colitis 03/19/2019  . Cocaine use disorder, moderate, in sustained remission ([place]) 03/16/2019  . Chronic hip pain, bilateral 01/09/2019  . Type 2 diabetes mellitus with other specified complication ([place]) 53/61/4431  . Restless leg syndrome 11/17/2018  . Cannabis use disorder, mild, in early remission 08/06/2018  . Alcohol use disorder, mild, abuse 08/06/2018  . Cocaine use disorder, mild, in early remission ([place]) 08/06/2018  . Neuroleptic induced Parkinsonism ([place]) 08/06/2018  . PTSD (post-traumatic stress disorder) 08/06/2018  . Essential tremor 04/02/2017  . Migraine  04/02/2017  . Essential hypertension 03/28/2017  . Moderate persistent asthma without complication 97/98/9211  . Tremor 03/07/2016  . Hyperlipidemia 05/17/2015  . Abnormal Pap smear of cervix 05/17/2015  . Tobacco use disorder 04/19/2015  . Sinusitis 04/19/2015  .  Constipation 04/19/2015  . Insomnia 03/31/2015  . Dyspnea 03/31/2015  . Bipolar disorder, in partial remission, most recent episode depressed ([place]) 03/31/2015  . Depression 03/31/2015  . Headache 11/25/2013  . Obesity (BMI 30-39.9) 11/25/2013  . Regular astigmatism 05/05/2010   PCP:  Mikey College, NP (Inactive) Pharmacy:   [facility] [address] [place] [address] [place] [address] Phone: [phone] Fax: [phone]     Social Determinants of Health (SDOH) Interventions    Readmission Risk Interventions Readmission Risk Prevention Plan 04/30/2019  Transportation Screening Complete  PCP or Specialist Appt within 3-5 Days Complete  HRI or Home Care Consult Complete  Medication Review (RN Care Manager) Complete  Some recent data might be hidden

## 2019-04-30 NOTE — Progress Notes (Signed)
Inpatient Diabetes Program Recommendations  AACE/ADA: New Consensus Statement on Inpatient Glycemic Control (2015)  Target Ranges:  Prepandial:   less than 140 mg/dL      Peak postprandial:   less than 180 mg/dL (1-2 hours)      Critically ill patients:  140 - 180 mg/dL   Lab Results  Component Value Date   GLUCAP 274 (H) 04/30/2019   HGBA1C 6.1 (H) 04/27/2019    Review of Glycemic Control Results for Vanessa Baker, Vanessa Baker (MRN [id number]) as of 04/30/2019 14:53  Ref. Range 04/29/2019 19:17 04/29/2019 23:29 04/30/2019 03:41 04/30/2019 07:21 04/30/2019 11:03  Glucose-Capillary Latest Ref Range: 70 - 99 mg/dL 267 (H) 224 (H) 189 (H) 178 (H) 274 (H)  Diabetes history: DM 2 Outpatient Diabetes medications:  Trulicity 1.5 mg weekly, Metformin 500 mg bid Current orders for Inpatient glycemic control:  Novolog moderate tid with meals and HS Lantus 5 units daily Solumedrol 40 mg IV q 12 hours Inpatient Diabetes Program Recommendations:   Please consider adding Novolog meal coverage 3 units tid with meals (hold if patient eats less than 50%) while on steroids.   Thanks  Adah Perl, RN, BC-ADM Inpatient Diabetes Coordinator Pager [phone] (8a-5p)

## 2019-04-30 NOTE — Progress Notes (Addendum)
BRIEF PATIENT DESCRIPTION:  44 year old female admitted 04/26/2019 with acute cholecystitis, status post laparoscopic cholecystectomy on 3/22.  During the procedure she was noted to have massive inflammatory response in the abdomen.  On 3/23 patient with hypoxia and increasing FiO2 requirements.  CT chest concerning for developing ARDS versus multifocal pneumonia.  Transferred to ICU on 3/24.  SIGNIFICANT EVENTS  3/21: Admission to [name] 3/22: Status post laparoscopic cholecystectomy 3/23: Hypoxic with increasing FiO2 requirements; CT chest with developing ARDS versus multifocal pneumonia 3/24: Transfer to ICU; hospitalist and PCCM consulted  STUDIES:  3/21: CT abdomen and pelvis>>Cholelithiasis. Marked irregular gallbladder wall thickening with surrounding inflammation. Findings compatible with acute cholecystitis. Fatty infiltration of the liver. 3/23: Chest x-ray>>Mediastinum and hilar structures normal. Heart size normal. Diffuse bilateral pulmonary infiltrates noted. No pleural effusion or pneumothorax. No acute bony abnormality identified 3/23: CTA chest>>No evidence of pulmonary embolus. Extensive diffuse bilateral airspace disease with upper and mid lung predominance. This likely reflects infection or ARDS. 3/24: Chest x-ray>>Extensive bilateral airspace disease again noted, unchanged. Heart is borderline in size. No effusions or pneumothorax. No acute bony Abnormality. 3/24: Abdominal x-ray>>Mild diffuse gaseous distention of bowel may reflect mild ileus.  CULTURES: SARS-CoV-2 POC antigen>> negative Blood culture x2 3/21>> Urine 3/21>> multiple species, recommends recollection Aerobic/anaerobic culture from gallbladder 3/22>> Strep pneumo urinary antigen 3/24>> Legionella urinary antigen 3/24>>  ANTIBIOTICS: Zosyn 3/21>> Azithromycin 3/24>> Ceftriaxone 3/24>>  CHIEF COMPLAINT:   Chief Complaint  Patient presents with  . Fever  . Abdominal Pain    HPI No  resp distress Weaning down fio2 On steroids and ABX On NEBS      Review of Systems:  Gen:  Denies  fever, sweats, chills weight loss  HEENT: Denies blurred vision, double vision, ear pain, eye pain, hearing loss, nose bleeds, sore throat Cardiac:  No dizziness, chest pain or heaviness, chest tightness,edema, No JVD Resp:   No cough, -sputum production, -shortness of breath,-wheezing, -hemoptysis,  Gi: Denies swallowing difficulty, stomach pain, nausea or vomiting, diarrhea, constipation, bowel incontinence Other:  All other systems negative   Objective   Examination:  General exam: Appears calm and comfortable  Respiratory system: Clear to auscultation. Respiratory effort normal. HEENT: [place]/AT, PERRLA, no thrush, no stridor. Cardiovascular system: S1 & S2 heard, RRR. No JVD, murmurs, rubs, gallops or clicks. No pedal edema. Gastrointestinal system: Abdomen is nondistended, soft and nontender. No organomegaly or masses felt. Normal bowel sounds heard. Central nervous system: Alert and oriented. No focal neurological deficits. Extremities: Symmetric 5 x 5 power. Skin: No rashes, lesions or ulcers Psychiatry: Judgement and insight appear normal. Mood & affect appropriate.   VITALS:  height is 5\' 2"  (1.575 m) and weight is 82.6 kg. Her oral temperature is 97.7 F (36.5 C). Her blood pressure is 113/59 (abnormal) and her pulse is 81. Her respiration is 24 (abnormal) and oxygen saturation is 93%.   I personally reviewed Labs under Results section.  Radiology Reports DG Chest Port 1 View  Result Date: 04/30/2019 CLINICAL DATA:  ARDS EXAM: PORTABLE CHEST 1 VIEW COMPARISON:  April 29, 2019 FINDINGS: The heart size and mediastinal contours are unchanged. Again noted is multifocal bilateral patchy airspace opacities throughout both lungs. There is slight interval improvement in the aeration of the left lung. No pleural effusion. IMPRESSION: Bilateral multifocal airspace opacities, with  slight interval improvement in the left lung. Electronically Signed   By: Prudencio Pair M.D.   On: 04/30/2019 05:09   DG Chest [facility] 1 View  Result  Date: 04/29/2019 CLINICAL DATA:  Fever, chills EXAM: PORTABLE CHEST 1 VIEW COMPARISON:  03/31/2019 FINDINGS: Extensive bilateral airspace disease again noted, unchanged. Heart is borderline in size. No effusions or pneumothorax. No acute bony abnormality. IMPRESSION: Diffuse bilateral airspace disease, unchanged. Electronically Signed   By: Rolm Baptise M.D.   On: 04/29/2019 02:06   DG Abd Portable 2V  Result Date: 04/29/2019 CLINICAL DATA:  Abdominal pain, fever, chills EXAM: PORTABLE ABDOMEN - 2 VIEW COMPARISON:  03/18/2015 FINDINGS: Prior cholecystectomy. Contrast seen within the colon. Mild gaseous distention of bowel diffusely. No organomegaly or free air. IMPRESSION: Mild diffuse gaseous distention of bowel may reflect mild ileus. Electronically Signed   By: Rolm Baptise M.D.   On: 04/29/2019 02:52   ECHOCARDIOGRAM COMPLETE  Result Date: 04/30/2019    ECHOCARDIOGRAM REPORT   Patient Name:   Vanessa Baker Date of Exam: 04/29/2019 Medical Rec #:  [id number]              Height:       62.0 in Accession #:    [id number]             Weight:       178.8 lb Date of Birth:  06-Dec-1975             BSA:          1.823 m Patient Age:    72 years               BP:           127/52 mmHg Patient Gender: F                      HR:           89 bpm. Exam Location:  [facility] Procedure: 2D Echo, Color Doppler, Cardiac Doppler and Intracardiac            Opacification Agent Indications:     R06.89 Acute Respiratory Insufficiency  History:         Patient has no prior history of Echocardiogram examinations.                  Risk Factors:Hypertension, HCL and Diabetes. Seizures.  Sonographer:     Charmayne Sheer RDCS (AE) Referring Phys:  [place] Diagnosing Phys: Neoma Laming MD  Sonographer Comments: Suboptimal apical window and no subcostal window. IMPRESSIONS   1. Left ventricular ejection fraction, by estimation, is 60 to 65%. The left ventricle has normal function. The left ventricle has no regional wall motion abnormalities. Left ventricular diastolic function could not be evaluated.  2. Right ventricular systolic function is normal. The right ventricular size is normal.  3. The mitral valve is normal in structure. No evidence of mitral valve regurgitation. No evidence of mitral stenosis.  4. The aortic valve is normal in structure. Aortic valve regurgitation is trivial. No aortic stenosis is present.  5. The inferior vena cava is normal in size with greater than 50% respiratory variability, suggesting right atrial pressure of 3 mmHg. FINDINGS  Left Ventricle: Left ventricular ejection fraction, by estimation, is 60 to 65%. The left ventricle has normal function. The left ventricle has no regional wall motion abnormalities. The left ventricular internal cavity size was normal in size. There is  no left ventricular hypertrophy. Left ventricular diastolic function could not be evaluated. Right Ventricle: The right ventricular size is normal. No increase in right ventricular wall thickness. Right ventricular systolic function is  normal. Left Atrium: Left atrial size was normal in size. Right Atrium: Right atrial size was normal in size. Pericardium: There is no evidence of pericardial effusion. Mitral Valve: The mitral valve is normal in structure. Normal mobility of the mitral valve leaflets. No evidence of mitral valve regurgitation. No evidence of mitral valve stenosis. MV peak gradient, 3.9 mmHg. The mean mitral valve gradient is 1.0 mmHg. Tricuspid Valve: The tricuspid valve is normal in structure. Tricuspid valve regurgitation is not demonstrated. No evidence of tricuspid stenosis. Aortic Valve: The aortic valve is normal in structure. Aortic valve regurgitation is trivial. No aortic stenosis is present. Aortic valve mean gradient measures 5.0 mmHg. Aortic valve peak  gradient measures 12.7 mmHg. Aortic valve area, by VTI measures 2.52 cm. Pulmonic Valve: The pulmonic valve was normal in structure. Pulmonic valve regurgitation is not visualized. No evidence of pulmonic stenosis. Aorta: The aortic root is normal in size and structure. Venous: The inferior vena cava is normal in size with greater than 50% respiratory variability, suggesting right atrial pressure of 3 mmHg. IAS/Shunts: No atrial level shunt detected by color flow Doppler.  LEFT VENTRICLE PLAX 2D LVIDd:         4.81 cm  Diastology LVIDs:         3.29 cm  LV e' lateral:   14.10 cm/s LV PW:         0.86 cm  LV E/e' lateral: 5.9 LV IVS:        0.64 cm  LV e' medial:    11.40 cm/s LVOT diam:     1.90 cm  LV E/e' medial:  7.2 LV SV:         66 LV SV Index:   36 LVOT Area:     2.84 cm  LEFT ATRIUM           Index LA diam:      3.20 cm 1.76 cm/m LA Vol (A4C): 27.0 ml 14.81 ml/m  AORTIC VALVE                    PULMONIC VALVE AV Area (Vmax):    2.12 cm     PV Vmax:       1.22 m/s AV Area (Vmean):   2.33 cm     PV Vmean:      77.400 cm/s AV Area (VTI):     2.52 cm     PV VTI:        0.230 m AV Vmax:           178.00 cm/s  PV Peak grad:  6.0 mmHg AV Vmean:          104.000 cm/s PV Mean grad:  3.0 mmHg AV VTI:            0.261 m AV Peak Grad:      12.7 mmHg AV Mean Grad:      5.0 mmHg LVOT Vmax:         133.00 cm/s LVOT Vmean:        85.500 cm/s LVOT VTI:          0.232 m LVOT/AV VTI ratio: 0.89  AORTA Ao Root diam: 2.80 cm MITRAL VALVE MV Area (PHT): 4.60 cm    SHUNTS MV Peak grad:  3.9 mmHg    Systemic VTI:  0.23 m MV Mean grad:  1.0 mmHg    Systemic Diam: 1.90 cm MV Vmax:       0.99 m/s MV Vmean:  55.0 cm/s MV Decel Time: 165 msec MV E velocity: 82.50 cm/s MV A velocity: 68.90 cm/s MV E/A ratio:  1.20 Neoma Laming MD Electronically signed by Neoma Laming MD Signature Date/Time: 04/30/2019/9:35:48 AM    Final        Assessment/Plan:   Acute hypoxic respiratory failure secondary to developing-atypical  pneumonia vs non-cardiogenic pulm edema --Supplemental O2 as needed to maintain O2 sats greater than 88% -Incentive spirometry -IV antibiotics  Lasix as needed -Conservative fluid management dounebs and pulmicort nebs   SEVERE COPD EXACERBATION -continue IV steroids as prescribed -continue NEB THERAPY as prescribed   Acute cholecystitis s/p laparoscopic cholecystectomy on 04/27/19 w/ massive inflammatory response within the abdomen +CITROBACTER SPECIES -Continue Zosyn  May consider transfer to gen med floor    Marko Skalski Patricia Pesa, M.D.  [facility]  Medical Director [place] Director [place] Department

## 2019-04-30 NOTE — Progress Notes (Signed)
Pharmacy Electrolyte Monitoring Consult:  Pharmacy consulted to assist in monitoring and replacing electrolytes in this 44 y.o. female admitted on 04/26/2019. Patient is day 3 s/p laparoscopic cholecystectomy.   Labs:  Sodium (mmol/L)  Date Value  04/30/2019 139  10/25/2016 136  06/11/2013 136   Potassium (mmol/L)  Date Value  04/30/2019 3.5  06/11/2013 3.5   Magnesium (mg/dL)  Date Value  04/30/2019 2.7 (H)   Phosphorus (mg/dL)  Date Value  04/30/2019 5.8 (H)   Calcium (mg/dL)  Date Value  04/30/2019 8.5 (L)   Calcium, Total (mg/dL)  Date Value  06/11/2013 8.7   Albumin (g/dL)  Date Value  04/29/2019 2.3 (L)  10/25/2016 4.5  06/10/2013 3.4    Assessment/Plan: Electrolytes: patient ordered potassium 34mEq PO this am, no other replacement warranted. Will obtain electrolytes with am labs.   Glucose: patient now with diet orders and continued on methylprednisolone 40mg  IV Q12hr. SSI updated to be with meals and at bedtime. Will order Lantus 5 units SQ daily while patient is receiving steroids.   Pharmacy will continue to monitor and adjust per consult.    Jawaun Celmer L 04/30/2019 1:31 PM

## 2019-04-30 NOTE — Progress Notes (Signed)
PHARMACIST - PHYSICIAN COMMUNICATION  CONCERNING: Antibiotic IV to Oral Route Change Policy  RECOMMENDATION: This patient is receiving azithromycin by the intravenous route.  Based on criteria approved by the Pharmacy and Therapeutics Committee, the antibiotic(s) is/are being converted to the equivalent oral dose form(s).   DESCRIPTION: These criteria include:  Patient being treated for a respiratory tract infection, urinary tract infection, cellulitis or clostridium difficile associated diarrhea if on metronidazole  The patient is not neutropenic and does not exhibit a GI malabsorption state  The patient is eating (either orally or via tube) and/or has been taking other orally administered medications for a least 24 hours  The patient is improving clinically and has a Tmax < 100.5  If you have questions about this conversion, please contact the Pharmacy Department

## 2019-04-30 NOTE — Progress Notes (Addendum)
[place] Hospital Day(s): 2.   Post op day(s): 3 Days Post-Op.   Interval History:  Patient seen and examined no acute events or new complaints overnight.  Patient reports she is feeling better from a surgical standpoint Abdominal soreness improved, no nausea or emesis, + flatus JP with serous output, 30 ccs in last 24 hours Leukocytosis worsening to 15.8K, afebrile Still requiring HFNC, 10L at time of evaluation --> desat to 88-90% with conversation  CXR this morning with some interval improvement  Vital signs in last 24 hours: [min-max] current  Temp:  [97.7 F (36.5 C)-98.9 F (37.2 C)] 98.9 F (37.2 C) (03/25 0400) Pulse Rate:  [62-92] 78 (03/25 0400) Resp:  [15-34] 20 (03/25 0400) BP: (69-146)/(51-74) 107/63 (03/25 0400) SpO2:  [81 %-97 %] 97 % (03/25 0400) Weight:  [82.6 kg] 82.6 kg (03/25 0400)     Height: 5\' 2"  (157.5 cm) Weight: 82.6 kg BMI (Calculated): 33.3   Intake/Output last 2 shifts:  03/24 0701 - 03/25 0700 In: 2131.1 [P.O.:1830; IV Piggyback:291.1] Out: 3405 [Urine:3375; Drains:30]   Physical Exam:  Constitutional: alert, cooperative and no distress  Respiratory: breathing non-labored at rest, on [place]  Cardiovascular: regular rate and sinus rhythm  Gastrointestinal: soft, improved incisional tenderness, non-distended, no rebound/guarding. Surgical drain in RUQ with serous output.  Integumentary: Laparoscopic incisions are CDI with steri-strips, no erythema or drainage   Labs:  CBC Latest Ref Rng & Units 04/30/2019 04/29/2019 04/28/2019  WBC 4.0 - 10.5 K/uL 15.8(H) 13.9(H) 12.5(H)  Hemoglobin 12.0 - 15.0 g/dL 11.2(L) 12.0 11.7(L)  Hematocrit 36.0 - 46.0 % 35.3(L) 37.3 36.6  Platelets 150 - 400 K/uL 371 358 311   CMP Latest Ref Rng & Units 04/30/2019 04/29/2019 04/28/2019  Glucose 70 - 99 mg/dL 211(H) 135(H) 135(H)  BUN 6 - 20 mg/dL 12 6 5(L)  Creatinine 0.44 - 1.00 mg/dL 0.74 0.65 0.68  Sodium 135 - 145 mmol/L 139  139 141  Potassium 3.5 - 5.1 mmol/L 3.5 3.5 4.0  Chloride 98 - 111 mmol/L 98 100 109  CO2 22 - 32 mmol/L 31 28 25   Calcium 8.9 - 10.3 mg/dL 8.5(L) 8.2(L) 8.0(L)  Total Protein 6.5 - 8.1 g/dL - 6.4(L) 6.1(L)  Total Bilirubin 0.3 - 1.2 mg/dL - 0.5 0.3  Alkaline Phos 38 - 126 U/L - 115 97  AST 15 - 41 U/L - 37 40  ALT 0 - 44 U/L - 23 24     Imaging studies: No new pertinent imaging studies   Assessment/Plan:  44 y.o. female 3 Days Post-Op s/p laparoscopic cholecystectomy for acute on chronic cholecystitis with severe intra-abdominal inflammatory response, complicated by development of likely ARDS post-operatively   - Continue regular diet              - Continue IV ABx (Zosyn + Azithromycin); GB Cx growing Citrobacter braakii and Staph aureus             - Appreciate PCCM & Medicine help with pulmonary issues/comorbidities             - pain control prn (multimodal); antiemetics prn             - monitor abdominal examination              - trend leukocytosis             - mobilization encouraged if feasible without hypoxemia   All of the above findings and recommendations were discussed with  the patient, and the medical team, and all of patient's questions were answered to her expressed satisfaction.  -- Edison Simon, PA-C [facility] 04/30/2019, 7:59 AM [phone] M-F: 7am - 4pm  We will remove the drain today.  At this time, her surgical issues are essentially resolved. I discussed transfer of service with Dr. Clementeen Graham, who has agreed to take over primary responsibility.  General surgery will be available for any questions or surgical concerns. I saw and evaluated the patient.  I agree with the above documentation, exam, and plan, which I have edited where appropriate. Fredirick Maudlin  12:46 PM

## 2019-05-01 DIAGNOSIS — J4541 Moderate persistent asthma with (acute) exacerbation: Secondary | ICD-10-CM

## 2019-05-01 DIAGNOSIS — E119 Type 2 diabetes mellitus without complications: Secondary | ICD-10-CM

## 2019-05-01 DIAGNOSIS — F319 Bipolar disorder, unspecified: Secondary | ICD-10-CM

## 2019-05-01 LAB — BASIC METABOLIC PANEL
Anion gap: 10 (ref 5–15)
BUN: 15 mg/dL (ref 6–20)
CO2: 28 mmol/L (ref 22–32)
Calcium: 8.9 mg/dL (ref 8.9–10.3)
Chloride: 101 mmol/L (ref 98–111)
Creatinine, Ser: 0.54 mg/dL (ref 0.44–1.00)
GFR calc Af Amer: >60 mL/min (ref >60)
GFR calc non Af Amer: >60 mL/min (ref >60)
Glucose, Bld: 221 mg/dL — ABNORMAL HIGH (ref 70–99)
Potassium: 4.5 mmol/L (ref 3.5–5.1)
Sodium: 139 mmol/L (ref 135–145)

## 2019-05-01 LAB — CULTURE, BLOOD (ROUTINE X 2)
Culture: NO GROWTH
Culture: NO GROWTH
Special Requests: ADEQUATE
Special Requests: ADEQUATE

## 2019-05-01 LAB — GLUCOSE, CAPILLARY
Glucose-Capillary: 235 mg/dL — ABNORMAL HIGH (ref 70–99)
Glucose-Capillary: 125 mg/dL — ABNORMAL HIGH (ref 70–99)
Glucose-Capillary: 149 mg/dL — ABNORMAL HIGH (ref 70–99)
Glucose-Capillary: 189 mg/dL — ABNORMAL HIGH (ref 70–99)
Glucose-Capillary: 124 mg/dL — ABNORMAL HIGH (ref 70–99)

## 2019-05-01 LAB — PROCALCITONIN: Procalcitonin: 0.36 ng/mL

## 2019-05-01 LAB — MAGNESIUM: Magnesium: 2.4 mg/dL (ref 1.7–2.4)

## 2019-05-01 MED ORDER — PREDNISONE 20 MG PO TABS
20.0000 mg | ORAL_TABLET | Freq: Every day | ORAL | Status: DC
  Administered 2019-05-02: 20 mg via ORAL
  Filled 2019-05-01: qty 1

## 2019-05-01 MED ORDER — SULFAMETHOXAZOLE-TRIMETHOPRIM 800-160 MG PO TABS
2.0000 | ORAL_TABLET | Freq: Two times a day (BID) | ORAL | Status: DC
  Administered 2019-05-01 – 2019-05-02 (×2): 2 via ORAL
  Filled 2019-05-01 (×3): qty 2

## 2019-05-01 MED ORDER — INSULIN ASPART 100 UNIT/ML ~~LOC~~ SOLN
5.0000 [IU] | Freq: Once | SUBCUTANEOUS | Status: AC
  Administered 2019-05-01: 5 [IU] via SUBCUTANEOUS

## 2019-05-01 NOTE — Consult Note (Signed)
Pharmacy Antibiotic Note  Vanessa Baker is a 44 y.o. female admitted on 04/26/2019 with abdominal pain for which a lap chole was performed on 3/22. Patient was found to have acute cholecystitis and was started on Zosyn + azithromycin. Cultures + sensitivites from gallbladder have resulted (see below). Pharmacy has been consulted for bactrim dosing.  Plan: Discontinue zosyn and azithromycin.   Initiate trimethoprim-sulfamethoxazole 160mg /800 mg 2 tablets PO Q12H  Height: 5\' 2"  (157.5 cm) Weight: 182 lb 1.6 oz (82.6 kg) IBW/kg (Calculated) : 50.1  Temp (24hrs), Avg:98.3 F (36.8 C), Min:97.7 F (36.5 C), Max:99 F (37.2 C)  Recent Labs  Lab 04/26/19 1724 04/26/19 1724 04/26/19 2044 04/27/19 5400 04/28/19 0514 04/29/19 0203 04/29/19 0333 04/30/19 0621 05/01/19 0435  WBC 17.8*  --   --  12.0* 12.5* 13.9*  --  15.8*  --   CREATININE 0.81   < >  --  0.62 0.68 0.65  --  0.74 0.54  LATICACIDVEN 1.2  --  0.9  --   --  1.1 1.0  --   --    < > = values in this interval not displayed.    Estimated Creatinine Clearance: 88.5 mL/min (by C-G formula based on SCr of 0.54 mg/dL).    Allergies  Allergen Reactions  . Amitriptyline Other (See Comments)    Confusion, paralysis    Antimicrobials this admission: Bactrim 3/26 >>  Azithromycin 3/24>> 3/26 Zosyn 3/21>>3/26  Dose adjustments this admission: n/a  Microbiology results: 3/21 BCx: No growth x 5 days 3/21 UCx: Multiple species present, recollection suggested 3/22 MRSA PCR: Negative 3/22 Body fluid from PATH gallbladder: MSSA, C. braakii Thank you for allowing pharmacy to be a part of this patient's care.  [place] Resident 05/01/2019 3:46 PM

## 2019-05-01 NOTE — Evaluation (Signed)
Physical Therapy Evaluation Patient Details Name: Vanessa Baker MRN: [id number] DOB: 1976/01/14 Today's Date: 05/01/2019   History of Present Illness  Pt admitted for ARDS secondary to acute cholecystitis and is now s/p laparoscopic cholecystectomy on 3/22 and sustained a massive inflammatory response. HIstory includes IBS, anxiety, bipolar, depression, DM, HA, HTN, and GERD.  Clinical Impression  Pt is a pleasant 44 year old female who was admitted for ARDS secondary to acute cholecystitis. Pt performs bed mobility/transfers with independence and ambulation with supervision and no AD. Pt demonstrates deficits with endurance/engery conservation. Fatigues with exertion with notable O2 sats decrease while on 3L of O2. Cues for pursed lip breathing. Would benefit from skilled PT to address above deficits and promote optimal return to PLOF. Currently recommend to follow acutely to promote mobility and assist in weaning O2 to regain independence. Do not expect further PT needs following acute care stay.    Follow Up Recommendations No PT follow up    Equipment Recommendations  None recommended by PT    Recommendations for Other Services       Precautions / Restrictions Precautions Precautions: None Restrictions Weight Bearing Restrictions: No      Mobility  Bed Mobility Overal bed mobility: Independent             General bed mobility comments: safe technique  Transfers Overall transfer level: Independent Equipment used: None             General transfer comment: safe technique with upright posture. All mobility performed on 3L of O2.  Ambulation/Gait Ambulation/Gait assistance: Supervision Gait Distance (Feet): 40 Feet Assistive device: None Gait Pattern/deviations: Step-through pattern     General Gait Details: ambulated in room to bathroom. O2 sats decreased to 81%. Seated rest break with cues for pursed lip breathing. IMproved to 95%. Reciprocal gait  pattern performed with safe technique without device  Stairs            Wheelchair Mobility    Modified Rankin (Stroke Patients Only)       Balance Overall balance assessment: Independent                                           Pertinent Vitals/Pain Pain Assessment: No/denies pain    Home Living Family/patient expects to be discharged to:: Private residence Living Arrangements: Spouse/significant other Available Help at Discharge: Family Type of Home: Mobile home Home Access: Stairs to enter Entrance Stairs-Rails: Can reach both Entrance Stairs-Number of Steps: 4 Home Layout: One level Home Equipment: None      Prior Function Level of Independence: Independent         Comments: very indep at baseline     Hand Dominance        Extremity/Trunk Assessment   Upper Extremity Assessment Upper Extremity Assessment: Overall WFL for tasks assessed    Lower Extremity Assessment Lower Extremity Assessment: Overall WFL for tasks assessed       Communication   Communication: No difficulties  Cognition Arousal/Alertness: Awake/alert Behavior During Therapy: WFL for tasks assessed/performed Overall Cognitive Status: Within Functional Limits for tasks assessed                                        General Comments      Exercises Other Exercises  Other Exercises: ambulated to bathroom with supervision. Able to turn on light switch and perform hygiene safely. No dizziness/LOB noted   Assessment/Plan    PT Assessment Patient needs continued PT services  PT Problem List Cardiopulmonary status limiting activity       PT Treatment Interventions Gait training;Stair training    PT Goals (Current goals can be found in the Care Plan section)  Acute Rehab PT Goals Patient Stated Goal: to wean off O2 PT Goal Formulation: With patient Time For Goal Achievement: 05/15/19 Potential to Achieve Goals: Good    Frequency Min  2X/week   Barriers to discharge        Co-evaluation               AM-PAC PT "6 Clicks" Mobility  Outcome Measure Help needed turning from your back to your side while in a flat bed without using bedrails?: None Help needed moving from lying on your back to sitting on the side of a flat bed without using bedrails?: None Help needed moving to and from a bed to a chair (including a wheelchair)?: None Help needed standing up from a chair using your arms (e.g., wheelchair or bedside chair)?: None Help needed to walk in hospital room?: None Help needed climbing 3-5 steps with a railing? : A Little 6 Click Score: 23    End of Session Equipment Utilized During Treatment: Gait belt;Oxygen Activity Tolerance: Patient tolerated treatment well Patient left: in chair(mod fall risk) Nurse Communication: Mobility status PT Visit Diagnosis: Difficulty in walking, not elsewhere classified (R26.2)    Time: 5790-3833 PT Time Calculation (min) (ACUTE ONLY): 16 min   Charges:   PT Evaluation $PT Eval Low Complexity: 1 Low PT Treatments $Therapeutic Activity: 8-22 mins        Greggory Stallion, PT, DPT [phone]   Vanessa Baker 05/01/2019, 4:22 PM

## 2019-05-01 NOTE — Progress Notes (Signed)
Patient ID: Vanessa Baker, female   DOB: 12/01/1975, 44 y.o.   MRN: [id number] [facility] PROGRESS NOTE  Kaileigh Viswanathan Thiemann JIR:[id number] DOB: 1975/09/27 DOA: 04/26/2019 PCP: Mikey College, NP (Inactive)  HPI/Subjective: Patient feeling a little bit better.  Switch from high flow nasal cannula over to regular nasal cannula.  Still with some cough and shortness of breath.  Objective: Vitals:   05/01/19 1243 05/01/19 1420  BP:  119/64  Pulse: 82 79  Resp: 19 18  Temp: 97.7 F (36.5 C) 99 F (37.2 C)  SpO2: 95% 94%    Intake/Output Summary (Last 24 hours) at 05/01/2019 1425 Last data filed at 05/01/2019 1300 Gross per 24 hour  Intake 907.46 ml  Output 1400 ml  Net -492.54 ml   Filed Weights   04/26/19 2210 04/29/19 0319 04/30/19 0400  Weight: 81.2 kg 81.1 kg 82.6 kg    ROS: Review of Systems  Constitutional: Negative for chills and fever.  Eyes: Negative for blurred vision.  Respiratory: Positive for cough and shortness of breath.   Cardiovascular: Negative for chest pain.  Gastrointestinal: Positive for abdominal pain. Negative for constipation, diarrhea, nausea and vomiting.  Genitourinary: Negative for dysuria.  Musculoskeletal: Negative for joint pain.  Neurological: Negative for dizziness and headaches.   Exam: Physical Exam  Constitutional: She is oriented to person, place, and time.  HENT:  Nose: No mucosal edema.  Mouth/Throat: No oropharyngeal exudate or posterior oropharyngeal edema.  Eyes: Conjunctivae and lids are normal.  Cardiovascular: S1 normal and S2 normal. Exam reveals no gallop.  No murmur heard. Respiratory: No respiratory distress. She has decreased breath sounds in the right middle field, the right lower field, the left middle field and the left lower field. She has no wheezes. She has rhonchi in the right lower field and the left lower field. She has no rales.  GI: Soft. Bowel sounds are normal. There is abdominal  tenderness.  Musculoskeletal:     Right ankle: Swelling present.     Left ankle: No swelling.  Lymphadenopathy:    She has no cervical adenopathy.  Neurological: She is alert and oriented to person, place, and time. No cranial nerve deficit.  Skin: Skin is warm. No rash noted. Nails show no clubbing.  Psychiatric: She has a normal mood and affect.      Data Reviewed: Basic Metabolic Panel: Recent Labs  Lab 04/27/19 0614 04/28/19 0514 04/29/19 0203 04/30/19 0621 05/01/19 0435  NA 141 141 139 139 139  K 3.7 4.0 3.5 3.5 4.5  CL 107 109 100 98 101  CO2 25 25 28 31 28   GLUCOSE 118* 135* 135* 211* 221*  BUN 6 5* 6 12 15   CREATININE 0.62 0.68 0.65 0.74 0.54  CALCIUM 8.4* 8.0* 8.2* 8.5* 8.9  MG  --  2.3  --  2.7* 2.4  PHOS 4.5 3.6  --  5.8*  --    Liver Function Tests: Recent Labs  Lab 04/26/19 1724 04/27/19 0614 04/28/19 0514 04/29/19 0203  AST 19 19 40 37  ALT 21 16 24 23   ALKPHOS 120 97 97 115  BILITOT 0.6 0.4 0.3 0.5  PROT 7.7 6.3* 6.1* 6.4*  ALBUMIN 3.4* 2.6* 2.5* 2.3*   Recent Labs  Lab 04/26/19 1825  LIPASE 26   CBC: Recent Labs  Lab 04/26/19 1724 04/27/19 0614 04/28/19 0514 04/29/19 0203 04/30/19 0621  WBC 17.8* 12.0* 12.5* 13.9* 15.8*  NEUTROABS 14.3*  --   --   --   --  HGB 13.7 12.3 11.7* 12.0 11.2*  HCT 43.0 38.1 36.6 37.3 35.3*  MCV 91.9 91.8 91.5 91.2 91.5  PLT 356 312 311 358 371   BNP (last 3 results) Recent Labs    04/29/19 0203 04/30/19 0621  BNP 172.0* 123.0*     CBG: Recent Labs  Lab 04/30/19 1543 04/30/19 2017 05/01/19 0340 05/01/19 0730 05/01/19 1134  GLUCAP 322* 144* 235* 125* 149*    Recent Results (from the past 240 hour(s))  Culture, blood (Routine x 2)     Status: None   Collection Time: 04/26/19  5:24 PM   Specimen: BLOOD  Result Value Ref Range Status   Specimen Description BLOOD LAL  Final   Special Requests   Final    BOTTLES DRAWN AEROBIC AND ANAEROBIC Blood Culture adequate volume   Culture    Final    NO GROWTH 5 DAYS Performed at [facility], [address] [place], [place] [address]    Report Status 05/01/2019 FINAL  Final  Urine Culture     Status: Abnormal   Collection Time: 04/26/19  5:26 PM   Specimen: Urine, Random  Result Value Ref Range Status   Specimen Description   Final    URINE, RANDOM Performed at [facility], [address], [place], [place] [address]    Special Requests   Final    NONE Performed at [facility], [address], [place], [place] [address]    Culture MULTIPLE SPECIES PRESENT, SUGGEST RECOLLECTION (A)  Final   Report Status 04/27/2019 FINAL  Final  Culture, blood (Routine x 2)     Status: None   Collection Time: 04/26/19  6:26 PM   Specimen: BLOOD  Result Value Ref Range Status   Specimen Description BLOOD RFA  Final   Special Requests   Final    BOTTLES DRAWN AEROBIC AND ANAEROBIC Blood Culture adequate volume   Culture   Final    NO GROWTH 5 DAYS Performed at [facility], [address] [place], [place] [address]    Report Status 05/01/2019 FINAL  Final  MRSA PCR Screening     Status: None   Collection Time: 04/27/19  7:30 AM   Specimen: Nasal Mucosa; Nasopharyngeal  Result Value Ref Range Status   MRSA by PCR NEGATIVE NEGATIVE Final    Comment:        The GeneXpert MRSA Assay (FDA approved for NASAL specimens only), is one component of a comprehensive MRSA colonization surveillance program. It is not intended to diagnose MRSA infection nor to guide or monitor treatment for MRSA infections. Performed at [facility], [address] [place], [place] [address]   Aerobic/Anaerobic Culture (surgical/deep wound)     Status: None (Preliminary result)   Collection Time: 04/27/19 11:34 AM   Specimen: PATH Gallbladder; Body Fluid  Result Value Ref Range Status   Specimen Description   Final    GALL BLADDER Performed at [facility], [address], [place],  [place] [address]    Special Requests   Final    NONE Performed at [facility], [place], [place] [address]    Gram Stain   Final    ABUNDANT WBC PRESENT, PREDOMINANTLY PMN ABUNDANT GRAM NEGATIVE RODS MODERATE GRAM POSITIVE COCCI Performed at [facility], [place] [address], [place], [place] [address]    Culture   Final    ABUNDANT CITROBACTER BRAAKII MODERATE STAPHYLOCOCCUS AUREUS NO ANAEROBES ISOLATED; CULTURE  IN PROGRESS FOR 5 DAYS    Report Status PENDING  Incomplete   Organism ID, Bacteria CITROBACTER BRAAKII  Final   Organism ID, Bacteria STAPHYLOCOCCUS AUREUS  Final      Susceptibility   Citrobacter braakii - MIC*    CEFAZOLIN RESISTANT Resistant     CEFEPIME 1 SENSITIVE Sensitive     CEFTAZIDIME 4 SENSITIVE Sensitive     CEFTRIAXONE 1 SENSITIVE Sensitive     CIPROFLOXACIN <=0.25 SENSITIVE Sensitive     GENTAMICIN <=1 SENSITIVE Sensitive     IMIPENEM <=0.25 SENSITIVE Sensitive     TRIMETH/SULFA <=20 SENSITIVE Sensitive     PIP/TAZO <=4 SENSITIVE Sensitive     * ABUNDANT CITROBACTER BRAAKII   Staphylococcus aureus - MIC*    CIPROFLOXACIN <=0.5 SENSITIVE Sensitive     ERYTHROMYCIN <=0.25 SENSITIVE Sensitive     GENTAMICIN <=0.5 SENSITIVE Sensitive     OXACILLIN 0.5 SENSITIVE Sensitive     TETRACYCLINE <=1 SENSITIVE Sensitive     VANCOMYCIN <=0.5 SENSITIVE Sensitive     TRIMETH/SULFA <=10 SENSITIVE Sensitive     CLINDAMYCIN <=0.25 SENSITIVE Sensitive     RIFAMPIN <=0.5 SENSITIVE Sensitive     Inducible Clindamycin NEGATIVE Sensitive     * MODERATE STAPHYLOCOCCUS AUREUS     Studies: DG Chest Port 1 View  Result Date: 04/30/2019 CLINICAL DATA:  ARDS EXAM: PORTABLE CHEST 1 VIEW COMPARISON:  April 29, 2019 FINDINGS: The heart size and mediastinal contours are unchanged. Again noted is multifocal bilateral patchy airspace opacities throughout both lungs. There is slight interval improvement in the aeration of the left lung. No pleural effusion.  IMPRESSION: Bilateral multifocal airspace opacities, with slight interval improvement in the left lung. Electronically Signed   By: Prudencio Pair M.D.   On: 04/30/2019 05:09    Scheduled Meds: . acetaminophen  1,000 mg Oral Q6H  . azithromycin  500 mg Oral Daily  . benztropine  1 mg Oral TID  . budesonide (PULMICORT) nebulizer solution  0.5 mg Nebulization BID  . Chlorhexidine Gluconate Cloth  6 each Topical Daily  . enoxaparin (LOVENOX) injection  40 mg Subcutaneous Q24H  . gabapentin  300 mg Oral BID  . insulin aspart  0-15 Units Subcutaneous TID WC  . insulin aspart  0-5 Units Subcutaneous QHS  . insulin aspart  3 Units Subcutaneous TID WC  . insulin glargine  5 Units Subcutaneous Daily  . ipratropium-albuterol  3 mL Nebulization QID  . lithium carbonate  300 mg Oral BID WC  . nicotine  21 mg Transdermal Daily  . OLANZapine  15 mg Oral QHS  . pantoprazole  40 mg Oral Daily  . pneumococcal 23 valent vaccine  0.5 mL Intramuscular Tomorrow-1000  . [START ON 05/02/2019] predniSONE  20 mg Oral Q breakfast  . primidone  50 mg Oral QHS  . sertraline  100 mg Oral Daily  . sulfamethoxazole-trimethoprim  2 tablet Oral Q12H  . traZODone  150 mg Oral QHS   Continuous Infusions: . sodium chloride Stopped (04/27/19 1136)    Assessment/Plan:  1. ARDS after cholecystectomy.  Acute respiratory failure with hypoxia.  Patient on Zithromax and Bactrim and prednisone.  Patient down to 4 L nasal cannula and off high flow nasal cannula.  Continues to improve daily. 2. Type 2 diabetes mellitus on low-dose Lantus since on steroids.  Hemoglobin A1c 6.1 so hopefully can come off insulin upon discharge and go back on her usual regimen. 3. Status post laparoscopic cholecystectomy.  Percutaneous drain removed yesterday by  surgery. 4. Elevated troponin demand ischemia 5. Moderate persistent asthma.  On Solu-Medrol nebulizers 6. Bipolar disorder on numerous medications 7. Physical therapy evaluate  Code  Status:     Code Status Orders  (From admission, onward)         Start     Ordered   04/26/19 2050  Full code  Continuous     04/26/19 2052        Code Status History    This patient has a current code status but no historical code status.   Advance Care Planning Activity     Family Communication: Spoke with husband on the phone Disposition Plan: Patient switch from high flow nasal cannula over to regular nasal cannula 4 L and still a bit hypoxic.  Hopefully over the weekend will be able to taper off oxygen.  Likely will need a few more days here in the hospital.  Since the patient came in not wearing oxygen I like to send her home not wearing oxygen.  Consultants:  Pulmonary  General surgery  Procedures:  Laparoscopic cholecystectomy  Antibiotics:  Bactrim  Zithromax  Time spent: 28 minutes    Maeghan Canny [employer]

## 2019-05-01 NOTE — Progress Notes (Signed)
Pharmacy Electrolyte Monitoring Consult:  Pharmacy consulted to assist in monitoring and replacing electrolytes in this 44 y.o. female admitted on 04/26/2019. Patient is day 3 s/p laparoscopic cholecystectomy.   Labs:  Sodium (mmol/L)  Date Value  05/01/2019 139  10/25/2016 136  06/11/2013 136   Potassium (mmol/L)  Date Value  05/01/2019 4.5  06/11/2013 3.5   Magnesium (mg/dL)  Date Value  05/01/2019 2.4   Phosphorus (mg/dL)  Date Value  04/30/2019 5.8 (H)   Calcium (mg/dL)  Date Value  05/01/2019 8.9   Calcium, Total (mg/dL)  Date Value  06/11/2013 8.7   Albumin (g/dL)  Date Value  04/29/2019 2.3 (L)  10/25/2016 4.5  06/10/2013 3.4    Assessment/Plan: Electrolytes:  -No electrolyte repletion indicated today. -BMP ordered for tomorrow AM.  Glucose:  -Patient now with diet orders. SSI updated to be with meals and at bedtime. -Basal insulin: Lantus 5 units SQ daily. -Steroids converted to prednisone 20 mg daily.   Pharmacy will continue to monitor and adjust per consult.    [place] Resident 05/01/2019 3:52 PM

## 2019-05-02 DIAGNOSIS — J4540 Moderate persistent asthma, uncomplicated: Secondary | ICD-10-CM

## 2019-05-02 DIAGNOSIS — J9621 Acute and chronic respiratory failure with hypoxia: Secondary | ICD-10-CM

## 2019-05-02 DIAGNOSIS — R778 Other specified abnormalities of plasma proteins: Secondary | ICD-10-CM

## 2019-05-02 LAB — GLUCOSE, CAPILLARY
Glucose-Capillary: 139 mg/dL — ABNORMAL HIGH (ref 70–99)
Glucose-Capillary: 186 mg/dL — ABNORMAL HIGH (ref 70–99)

## 2019-05-02 LAB — BASIC METABOLIC PANEL
Anion gap: 12 (ref 5–15)
BUN: 19 mg/dL (ref 6–20)
CO2: 27 mmol/L (ref 22–32)
Calcium: 8.7 mg/dL — ABNORMAL LOW (ref 8.9–10.3)
Chloride: 100 mmol/L (ref 98–111)
Creatinine, Ser: 0.64 mg/dL (ref 0.44–1.00)
GFR calc Af Amer: >60 mL/min (ref >60)
GFR calc non Af Amer: >60 mL/min (ref >60)
Glucose, Bld: 116 mg/dL — ABNORMAL HIGH (ref 70–99)
Potassium: 3.7 mmol/L (ref 3.5–5.1)
Sodium: 139 mmol/L (ref 135–145)

## 2019-05-02 MED ORDER — PREDNISONE 10 MG PO TABS: ORAL_TABLET | ORAL | 0 refills | Status: DC

## 2019-05-02 MED ORDER — AZITHROMYCIN 500 MG PO TABS: ORAL_TABLET | ORAL | 0 refills | Status: DC

## 2019-05-02 MED ORDER — NICOTINE 21 MG/24HR TD PT24: MEDICATED_PATCH | TRANSDERMAL | 0 refills | Status: DC

## 2019-05-02 MED ORDER — ONDANSETRON 4 MG PO TBDP: 4.0000 mg | ORAL_TABLET | Freq: Four times a day (QID) | ORAL | 0 refills | Status: DC | PRN

## 2019-05-02 MED ORDER — SULFAMETHOXAZOLE-TRIMETHOPRIM 800-160 MG PO TABS: 2.0000 | ORAL_TABLET | Freq: Two times a day (BID) | ORAL | 0 refills | Status: AC

## 2019-05-02 NOTE — Progress Notes (Signed)
MD order received in [facility] to discharge pt home on home O2 today; TOC previously established home oxygen with [employer]; concentrator delivered by [employer] to Room 154; verbally reviewed AVS with pt, no questions voiced at this time; pt placed on 3L O2 via concentrator on battery and discharged via wheelchair by nursing to the [facility] entrance

## 2019-05-02 NOTE — Discharge Instructions (Signed)
[employer] Home [facility]xygen

## 2019-05-02 NOTE — Progress Notes (Signed)
SATURATION QUALIFICATIONS: (This note is used to comply with regulatory documentation for home oxygen)  Patient Saturations on Room Air at Rest = 84%  Patient Saturations on Room Air while Ambulating = 78%  Patient Saturations on 2 Liters of oxygen while Ambulating = 90%; 3 Liters of oxygen while ambulating = 94%  Please briefly explain why patient needs home oxygen:

## 2019-05-02 NOTE — Progress Notes (Signed)
Pharmacy Electrolyte Monitoring Consult:  Pharmacy consulted to assist in monitoring and replacing electrolytes in this 44 y.o. female admitted on 04/26/2019. Patient is day 3 s/p laparoscopic cholecystectomy.   Labs:  Sodium (mmol/L)  Date Value  05/02/2019 139  10/25/2016 136  06/11/2013 136   Potassium (mmol/L)  Date Value  05/02/2019 3.7  06/11/2013 3.5   Magnesium (mg/dL)  Date Value  05/01/2019 2.4   Phosphorus (mg/dL)  Date Value  04/30/2019 5.8 (H)   Calcium (mg/dL)  Date Value  05/02/2019 8.7 (L)   Calcium, Total (mg/dL)  Date Value  06/11/2013 8.7   Albumin (g/dL)  Date Value  04/29/2019 2.3 (L)  10/25/2016 4.5  06/10/2013 3.4    Assessment/Plan: Electrolytes:  -No electrolyte repletion indicated today. -BMP ordered for tomorrow AM.  Glucose:  -Patient now with diet orders. SSI updated to be with meals and at bedtime. -Basal insulin: Lantus 5 units SQ daily. -Steroids converted to prednisone 20 mg daily.   Electrolytes stable. Transferred out of the ICU. Pharmacy will sign off at this time. Please re-consult if needed.   Oswald Hillock, PharmD, BCPS 05/02/2019 7:34 AM

## 2019-05-02 NOTE — Plan of Care (Signed)
  Problem: Education: Goal: Knowledge of General Education information will improve Description: Including pain rating scale, medication(s)/side effects and non-pharmacologic comfort measures Outcome: Progressing   Problem: Clinical Measurements: Goal: Ability to maintain clinical measurements within normal limits will improve Outcome: Progressing Goal: Will remain free from infection Outcome: Progressing Goal: Diagnostic test results will improve Outcome: Progressing Goal: Respiratory complications will improve Outcome: Progressing Goal: Cardiovascular complication will be avoided Outcome: Progressing   Problem: Activity: Goal: Risk for activity intolerance will decrease Outcome: Progressing   Problem: Coping: Goal: Level of anxiety will decrease Outcome: Progressing   Problem: Pain Managment: Goal: General experience of comfort will improve Outcome: Progressing   Problem: Safety: Goal: Ability to remain free from injury will improve Outcome: Progressing   Problem: Skin Integrity: Goal: Risk for impaired skin integrity will decrease Outcome: Progressing

## 2019-05-02 NOTE — Discharge Summary (Signed)
[facility] NAME: Vanessa Baker    MR#:  [id number]  DATE OF BIRTH:  1975-04-18  DATE OF ADMISSION:  04/26/2019 ADMITTING PHYSICIAN: Fredirick Maudlin, MD  DATE OF DISCHARGE: 05/02/2019  PRIMARY CARE PHYSICIAN: Mikey College, NP (Inactive)    ADMISSION DIAGNOSIS:  Cholecystitis [K81.9] Cholecystitis, acute [K81.0] Sepsis ([place]) [A41.9]  DISCHARGE DIAGNOSIS:  Principal Problem:   ARDS (adult respiratory distress syndrome) ([place]) Active Problems:   Essential hypertension   Moderate persistent asthma without complication   Type 2 diabetes mellitus without complication, without long-term current use of insulin (HCC)   Cholecystitis, acute   S/P laparoscopic cholecystectomy   Bilateral pneumonia   Elevated troponin   Acute respiratory failure with hypoxia (HCC)   Moderate persistent asthma with acute exacerbation   Bipolar 1 disorder ([place])   SECONDARY DIAGNOSIS:   Past Medical History:  Diagnosis Date  . Allergy   . Anxiety   . Asthma   . Bipolar disorder ([place])   . Bursitis   . Depression   . Diabetes mellitus without complication ([place])   . Essential hypertension 03/28/2017  . Frequent headaches   . GERD (gastroesophageal reflux disease)   . Headache   . High cholesterol   . Hypertension   . Irritable bowel syndrome (IBS)   . Irritable colon 02/23/2010  . Migraine 04/02/2017  . Obesity (BMI 30-39.9) 11/25/2013  . Seizure ([place]) [phone]  . Seizures ([place])   . Tremor     HOSPITAL COURSE:   1.  Acute respiratory distress syndrome after laparoscopic cholecystectomy.  Patient has acute now chronic respiratory failure.  Bilateral pneumonia.  The patient was feeling better but patient was still hypoxic.  She wanted to go home.  She did qualify for oxygen at home.  Pulse ox at rest was 84% and ambulation down to 78% on room air.  With ambulating on 2 L pulse ox 90%.  Patient was set up with home oxygen.  Follow-up  with pulmonary as outpatient for a 6-minute exercise test to see if she can come off oxygen.  Patient was given high flow nasal cannula oxygen during the hospital course and ICU care.  Patient received nebulizers and steroids.  The patient has inhalers and nebulizers at home already.  The patient was was given a few more days of low-dose prednisone to go home with.  She will finish up a course of antibiotics.  2 more days of Zithromax and 9 more doses of Bactrim DS.  CT scan of the chest no pulmonary embolism but bilateral airspace disease.  Patient was advised to wear her oxygen 24/7. 2.  Type 2 diabetes mellitus.  The patient was on low-dose Lantus while here on steroids.  Hemoglobin A1c 6.1 so hopefully can go back to her usual regimen without a problem upon going home with Trulicity and Metformin. 3.  Status post laparoscopic cholecystectomy.  Percutaneous drain removed by surgery on 04/30/2019.  Outpatient follow-up with Dr. Celine Ahr. 4.  Elevated troponin demand ischemia from acute respiratory failure 5.  Moderate persistent asthma.  Patient on prednisone and nebulizer treatments 6.  Bipolar disorder on numerous medications   DISCHARGE CONDITIONS:   Satisfactory  CONSULTS OBTAINED:  Treatment Team:  Louellen Molder, MD  DRUG ALLERGIES:   Allergies  Allergen Reactions  . Amitriptyline Other (See Comments)    Confusion, paralysis    DISCHARGE MEDICATIONS:   Allergies as of 05/02/2019      Reactions  Amitriptyline Other (See Comments)   Confusion, paralysis      Medication List    STOP taking these medications   budesonide-formoterol 160-4.5 MCG/ACT inhaler Commonly known as: SYMBICORT   ibuprofen 200 MG tablet Commonly known as: ADVIL   lisinopril 10 MG tablet Commonly known as: ZESTRIL   RA Fish Oil 1000 MG Caps   Suprep Bowel Prep Kit 17.5-3.13-1.6 GM/177ML Soln Generic drug: Na Sulfate-K Sulfate-Mg Sulf     TAKE these medications   Accu-Chek Aviva Plus test  strip Generic drug: glucose blood USE 1 STRIP TO CHECK GLUCOSE UP TO 3 TIMES DAILY AS DIRECTED.   Accu-Chek Aviva Plus w/Device Kit Use to check blood sugar up to 3 times daily   Accu-Chek Softclix Lancets lancets Use to check blood sugar up to 3 times daily as instructed   acetaminophen 500 MG tablet Commonly known as: TYLENOL Take 500 mg by mouth every 6 (six) hours as needed.   albuterol 108 (90 Base) MCG/ACT inhaler Commonly known as: ProAir HFA Inhale 2 puffs into the lungs every 4 (four) hours as needed for wheezing or shortness of breath.   atorvastatin 40 MG tablet Commonly known as: LIPITOR Take 1 tablet (40 mg total) by mouth daily.   azithromycin 500 MG tablet Commonly known as: ZITHROMAX One tab po daily for two more days What changed:   how much to take  how to take this  when to take this  additional instructions   baclofen 10 MG tablet Commonly known as: LIORESAL Take 1 tablet (10 mg total) by mouth 3 (three) times daily as needed for muscle spasms.   benztropine 1 MG tablet Commonly known as: COGENTIN Take 1 tablet (1 mg total) by mouth 3 (three) times daily.   blood glucose meter kit and supplies Dispense based on patient and insurance preference. Use up to four times daily as directed. (FOR ICD-10 E10.9, E11.9).   CALCIUM 600+D3 PO Take 1 tablet by mouth daily.   esomeprazole 20 MG capsule Commonly known as: NexIUM Take 1 capsule (20 mg total) by mouth daily at 12 noon.   gabapentin 300 MG capsule Commonly known as: Neurontin Take 1 capsule (300 mg total) by mouth 2 (two) times daily. For restless legs   linaclotide 145 MCG Caps capsule Commonly known as: Linzess Take 1 capsule (145 mcg total) by mouth daily before breakfast.   lithium carbonate 300 MG capsule Take 1 capsule (300 mg total) by mouth 2 (two) times daily with a meal.   metFORMIN 500 MG tablet Commonly known as: GLUCOPHAGE Take 500 mg by mouth 2 (two) times daily. What  changed: Another medication with the same name was removed. Continue taking this medication, and follow the directions you see here.   Multivitamin Adult Extra C Chew Chew by mouth.   nicotine 21 mg/24hr patch Commonly known as: NICODERM CQ - dosed in mg/24 hours One 3m patch chest wall daily (may substitute generic)   norethindrone-ethinyl estradiol 1-20 MG-MCG tablet Commonly known as: Junel FE 1/20 Take 1 tablet by mouth daily.   OLANZapine 15 MG tablet Commonly known as: ZyPREXA Take 1 tablet (15 mg total) by mouth at bedtime.   omega-3 acid ethyl esters 1 g capsule Commonly known as: LOVAZA Take 1 capsule by mouth twice daily   ondansetron 4 MG disintegrating tablet Commonly known as: ZOFRAN-ODT Take 1 tablet (4 mg total) by mouth every 6 (six) hours as needed for nausea.   predniSONE 10 MG tablet Commonly known  as: DELTASONE Two tabs po day1,2; 1 tab po day3,4; 1/2 tab po day5,6 Start taking on: May 03, 2019   primidone 50 MG tablet Commonly known as: MYSOLINE Take 1 tablet (50 mg total) by mouth at bedtime.   sertraline 100 MG tablet Commonly known as: ZOLOFT Take 1 tablet (100 mg total) by mouth daily.   sulfamethoxazole-trimethoprim 800-160 MG tablet Commonly known as: BACTRIM DS Take 2 tablets by mouth every 12 (twelve) hours for 9 doses.   traZODone 100 MG tablet Commonly known as: DESYREL Take 1.5 tablets (150 mg total) by mouth at bedtime. For sleep   Trelegy Ellipta 100-62.5-25 MCG/INH Aepb Generic drug: Fluticasone-Umeclidin-Vilant Inhale 1 puff into the lungs daily.   Trulicity 1.5 AQ/7.6AU Sopn Generic drug: Dulaglutide Inject 1.5 mg into the skin every 7 (seven) days.            Durable Medical Equipment  (From admission, onward)         Start     Ordered   05/02/19 0916  For home use only DME oxygen  Once    Comments: Dx ARDS  Question Answer Comment  Length of Need 12 Months   Mode or (Route) Nasal cannula   Liters per  Minute 2   Frequency Continuous (stationary and portable oxygen unit needed)   Oxygen conserving device Yes   Oxygen delivery system Gas      05/02/19 0916           DISCHARGE INSTRUCTIONS:   Follow-up PMD 5 days Follow-up Dr. Celine Ahr general surgery 1 week Follow-up Dr. Mortimer Fries pulmonary 3 weeks  If you experience worsening of your admission symptoms, develop shortness of breath, life threatening emergency, suicidal or homicidal thoughts you must seek medical attention immediately by calling 911 or calling your MD immediately  if symptoms less severe.  You Must read complete instructions/literature along with all the possible adverse reactions/side effects for all the Medicines you take and that have been prescribed to you. Take any new Medicines after you have completely understood and accept all the possible adverse reactions/side effects.   Please note  You were cared for by a hospitalist during your hospital stay. If you have any questions about your discharge medications or the care you received while you were in the hospital after you are discharged, you can call the unit and asked to speak with the hospitalist on call if the hospitalist that took care of you is not available. Once you are discharged, your primary care physician will handle any further medical issues. Please note that NO REFILLS for any discharge medications will be authorized once you are discharged, as it is imperative that you return to your primary care physician (or establish a relationship with a primary care physician if you do not have one) for your aftercare needs so that they can reassess your need for medications and monitor your lab values.    Today   CHIEF COMPLAINT:   Chief Complaint  Patient presents with  . Fever  . Abdominal Pain    HISTORY OF PRESENT ILLNESS:  Vanessa Baker  is a 44 y.o. female initially came in with fever abdominal pain and diagnosed with acute cholecystitis   VITAL  SIGNS:  Blood pressure 115/63, pulse 79, temperature 98.5 F (36.9 C), temperature source Oral, resp. rate 16, height _0  (1.575 m), weight 82.6 kg, last menstrual period 11/26/2018, SpO2 (!) 84 %.    PHYSICAL EXAMINATION:  GENERAL:  44 y.o.-year-old patient lying in  the bed with no acute distress.  EYES: Pupils equal, round, reactive to light and accommodation. No scleral icterus.  HEENT: Head atraumatic, normocephalic. LUNGS: Decreased breath sounds bilaterally, rhonchi in bilateral lung fields. No use of accessory muscles of respiration.  CARDIOVASCULAR: S1, S2 normal. No murmurs, rubs, or gallops.  ABDOMEN: Soft, tender, non-distended. Bowel sounds present.  EXTREMITIES: No pedal edema, cyanosis, or clubbing.  NEUROLOGIC: Cranial nerves II through XII are intact. Muscle strength 5/5 in all extremities. Sensation intact. Gait not checked.  PSYCHIATRIC: The patient is alert and oriented x 3.  SKIN: No obvious rash, lesion, or ulcer.   DATA REVIEW:   CBC Recent Labs  Lab 04/30/19 0621  WBC 15.8*  HGB 11.2*  HCT 35.3*  PLT 371    Chemistries  Recent Labs  Lab 04/29/19 0203 04/30/19 0621 05/01/19 0435 05/01/19 0435 05/02/19 0546  NA 139   < > 139   < > 139  K 3.5   < > 4.5   < > 3.7  CL 100   < > 101   < > 100  CO2 28   < > 28   < > 27  GLUCOSE 135*   < > 221*   < > 116*  BUN 6   < > 15   < > 19  CREATININE 0.65   < > 0.54   < > 0.64  CALCIUM 8.2*   < > 8.9   < > 8.7*  MG  --    < > 2.4  --   --   AST 37  --   --   --   --   ALT 23  --   --   --   --   ALKPHOS 115  --   --   --   --   BILITOT 0.5  --   --   --   --    < > = values in this interval not displayed.    Microbiology Results  Results for orders placed or performed during the hospital encounter of 04/26/19  Culture, blood (Routine x 2)     Status: None   Collection Time: 04/26/19  5:24 PM   Specimen: BLOOD  Result Value Ref Range Status   Specimen Description BLOOD LAL  Final   Special Requests    Final    BOTTLES DRAWN AEROBIC AND ANAEROBIC Blood Culture adequate volume   Culture   Final    NO GROWTH 5 DAYS Performed at [facility], [address], [place], [place] [address]    Report Status 05/01/2019 FINAL  Final  Urine Culture     Status: Abnormal   Collection Time: 04/26/19  5:26 PM   Specimen: Urine, Random  Result Value Ref Range Status   Specimen Description   Final    URINE, RANDOM Performed at [facility], [address] [place], [place] [address]    Special Requests   Final    NONE Performed at [facility], [address] [place], [place] [address]    Culture MULTIPLE SPECIES PRESENT, SUGGEST RECOLLECTION (A)  Final   Report Status 04/27/2019 FINAL  Final  Culture, blood (Routine x 2)     Status: None   Collection Time: 04/26/19  6:26 PM   Specimen: BLOOD  Result Value Ref Range Status   Specimen Description BLOOD RFA  Final   Special Requests   Final    BOTTLES DRAWN AEROBIC AND ANAEROBIC Blood  Culture adequate volume   Culture   Final    NO GROWTH 5 DAYS Performed at [facility], [address] [place], [place] [address]    Report Status 05/01/2019 FINAL  Final  MRSA PCR Screening     Status: None   Collection Time: 04/27/19  7:30 AM   Specimen: Nasal Mucosa; Nasopharyngeal  Result Value Ref Range Status   MRSA by PCR NEGATIVE NEGATIVE Final    Comment:        The GeneXpert MRSA Assay (FDA approved for NASAL specimens only), is one component of a comprehensive MRSA colonization surveillance program. It is not intended to diagnose MRSA infection nor to guide or monitor treatment for MRSA infections. Performed at [facility], [address] [place], [place] [address]   Aerobic/Anaerobic Culture (surgical/deep wound)     Status: None (Preliminary result)   Collection Time: 04/27/19 11:34 AM   Specimen: PATH Gallbladder; Body Fluid  Result Value Ref Range Status   Specimen Description    Final    GALL BLADDER Performed at [facility], [address] [place], [place] [address]    Special Requests   Final    NONE Performed at [facility], [place], [place] [address]    Gram Stain   Final    ABUNDANT WBC PRESENT, PREDOMINANTLY PMN ABUNDANT GRAM NEGATIVE RODS MODERATE GRAM POSITIVE COCCI Performed at [facility], [place] [address], [place], [place] [address]    Culture   Final    ABUNDANT CITROBACTER BRAAKII MODERATE STAPHYLOCOCCUS AUREUS    Report Status PENDING  Incomplete   Organism ID, Bacteria CITROBACTER BRAAKII  Final   Organism ID, Bacteria STAPHYLOCOCCUS AUREUS  Final      Susceptibility   Citrobacter braakii - MIC*    CEFAZOLIN RESISTANT Resistant     CEFEPIME 1 SENSITIVE Sensitive     CEFTAZIDIME 4 SENSITIVE Sensitive     CEFTRIAXONE 1 SENSITIVE Sensitive     CIPROFLOXACIN <=0.25 SENSITIVE Sensitive     GENTAMICIN <=1 SENSITIVE Sensitive     IMIPENEM <=0.25 SENSITIVE Sensitive     TRIMETH/SULFA <=20 SENSITIVE Sensitive     PIP/TAZO <=4 SENSITIVE Sensitive     * ABUNDANT CITROBACTER BRAAKII   Staphylococcus aureus - MIC*    CIPROFLOXACIN <=0.5 SENSITIVE Sensitive     ERYTHROMYCIN <=0.25 SENSITIVE Sensitive     GENTAMICIN <=0.5 SENSITIVE Sensitive     OXACILLIN 0.5 SENSITIVE Sensitive     TETRACYCLINE <=1 SENSITIVE Sensitive     VANCOMYCIN <=0.5 SENSITIVE Sensitive     TRIMETH/SULFA <=10 SENSITIVE Sensitive     CLINDAMYCIN <=0.25 SENSITIVE Sensitive     RIFAMPIN <=0.5 SENSITIVE Sensitive     Inducible Clindamycin NEGATIVE Sensitive     * MODERATE STAPHYLOCOCCUS AUREUS     Management plans discussed with the patient, family and they are in agreement.  CODE STATUS:     Code Status Orders  (From admission, onward)         Start     Ordered   04/26/19 2050  Full code  Continuous     04/26/19 2052        Code Status History    This patient has a current code status but no historical code status.    Advance Care Planning Activity      TOTAL TIME TAKING CARE OF THIS PATIENT: 35 minutes.    Loletha Grayer M.D on 05/02/2019 at 3:58 PM  Between 7am  to 6pm - Pager - [phone]  After 6pm go to [email or web address] - password [email or web address]  [facility]  CC: Primary care physician; Mikey College, NP (Inactive)

## 2019-05-02 NOTE — Plan of Care (Signed)

## 2019-05-03 LAB — AEROBIC/ANAEROBIC CULTURE W GRAM STAIN (SURGICAL/DEEP WOUND)

## 2019-05-05 ENCOUNTER — Ambulatory Visit (INDEPENDENT_AMBULATORY_CARE_PROVIDER_SITE_OTHER): Payer: Medicare HMO | Admitting: Family Medicine

## 2019-05-05 ENCOUNTER — Encounter: Payer: Self-pay | Admitting: Family Medicine

## 2019-05-05 ENCOUNTER — Other Ambulatory Visit: Payer: Self-pay

## 2019-05-05 ENCOUNTER — Telehealth: Payer: Self-pay | Admitting: General Surgery

## 2019-05-05 DIAGNOSIS — J9601 Acute respiratory failure with hypoxia: Secondary | ICD-10-CM | POA: Diagnosis not present

## 2019-05-05 DIAGNOSIS — J80 Acute respiratory distress syndrome: Secondary | ICD-10-CM | POA: Diagnosis not present

## 2019-05-05 DIAGNOSIS — C3400 Malignant neoplasm of unspecified main bronchus: Secondary | ICD-10-CM | POA: Diagnosis not present

## 2019-05-05 DIAGNOSIS — J4541 Moderate persistent asthma with (acute) exacerbation: Secondary | ICD-10-CM | POA: Diagnosis not present

## 2019-05-05 NOTE — Progress Notes (Signed)
Subjective:    Patient ID: Vanessa Baker, female    DOB: 1975/11/17, 44 y.o.   MRN: [id number]  Vanessa Baker is a 44 y.o. female presenting on 05/05/2019 for Hospitalization Follow-up (cholecystitis, acute respiratory failure x 7 days )   HPI  HOSPITAL FOLLOW-UP VISIT  Hospital/Location: [facility] Date of Admission: 04/26/2019 Date of Discharge: 05/02/2019 Transitions of care telephone call: 05/04/2019  Reason for Admission: Cholecystitis Primary (+Secondary) Diagnosis: Sepsis, shortness of breath, acute respiratory failure, ARDS  FOLLOW-UP  - Hospital H&P and Discharge Summary have been reviewed - Patient presents today 4 days after recent hospitalization. Brief summary of recent course, patient had symptoms of fever, chills, abdominal pain for approx 1 week, hospitalized, laparoscopic cholecystectomy on 04/27/2019 and subsequently developing ARDS requiring O2, antibiotics, and to follow up with pulmonary after discharge to discuss coming off of oxygen. - New medications on discharge: None - Changes to current meds on discharge: None  - Today reports overall has done well after discharge. Symptoms of shortness of breath are much improved.  Social History   Tobacco Use  . Smoking status: Former Smoker    Packs/day: 0.50    Years: 27.00    Pack years: 13.50    Types: Cigarettes    Quit date: 04/26/2019    Years since quitting: 0.0  . Smokeless tobacco: Never Used  Substance Use Topics  . Alcohol use: Not Currently  . Drug use: No    Review of Systems  Constitutional: Negative.   HENT: Negative.   Eyes: Negative.   Respiratory: Positive for shortness of breath. Negative for apnea, cough, choking, chest tightness, wheezing and stridor.        Dyspnea on exertion  Cardiovascular: Negative.   Gastrointestinal: Negative.   Endocrine: Negative.   Genitourinary: Negative.   Musculoskeletal: Negative.   Skin: Negative.   Allergic/Immunologic: Negative.     Neurological: Negative.   Hematological: Negative.   Psychiatric/Behavioral: Negative.    Per HPI unless specifically indicated above  Outpatient Encounter Medications as of 05/05/2019  Medication Sig  . ACCU-CHEK AVIVA PLUS test strip USE 1 STRIP TO CHECK GLUCOSE UP TO 3 TIMES DAILY AS DIRECTED.  . Accu-Chek Softclix Lancets lancets Use to check blood sugar up to 3 times daily as instructed  . acetaminophen (TYLENOL) 500 MG tablet Take 500 mg by mouth every 6 (six) hours as needed.  Marland Kitchen albuterol (PROAIR HFA) 108 (90 Base) MCG/ACT inhaler Inhale 2 puffs into the lungs every 4 (four) hours as needed for wheezing or shortness of breath.  Marland Kitchen atorvastatin (LIPITOR) 40 MG tablet Take 1 tablet (40 mg total) by mouth daily.  . baclofen (LIORESAL) 10 MG tablet Take 1 tablet (10 mg total) by mouth 3 (three) times daily as needed for muscle spasms.  . benztropine (COGENTIN) 1 MG tablet Take 1 tablet (1 mg total) by mouth 3 (three) times daily.  . blood glucose meter kit and supplies Dispense based on patient and insurance preference. Use up to four times daily as directed. (FOR ICD-10 E10.9, E11.9).  Marland Kitchen Blood Glucose Monitoring Suppl (ACCU-CHEK AVIVA PLUS) w/Device KIT Use to check blood sugar up to 3 times daily  . Calcium Carb-Cholecalciferol (CALCIUM 600+D3 PO) Take 1 tablet by mouth daily.  . Dulaglutide (TRULICITY) 1.5 JX/9.1YN SOPN Inject 1.5 mg into the skin every 7 (seven) days.  Marland Kitchen esomeprazole (NEXIUM) 20 MG capsule Take 1 capsule (20 mg total) by mouth daily at 12 noon.  . Fluticasone-Umeclidin-Vilant (TRELEGY ELLIPTA) 100-62.5-25  MCG/INH AEPB Inhale 1 puff into the lungs daily.  Marland Kitchen gabapentin (NEURONTIN) 300 MG capsule Take 1 capsule (300 mg total) by mouth 2 (two) times daily. For restless legs  . linaclotide (LINZESS) 145 MCG CAPS capsule Take 1 capsule (145 mcg total) by mouth daily before breakfast.  . lithium carbonate 300 MG capsule Take 1 capsule (300 mg total) by mouth 2 (two) times  daily with a meal.  . metFORMIN (GLUCOPHAGE) 500 MG tablet Take 500 mg by mouth 2 (two) times daily.  . Multiple Vitamins-Minerals (MULTIVITAMIN ADULT EXTRA C) CHEW Chew by mouth.  . nicotine (NICODERM CQ - DOSED IN MG/24 HOURS) 21 mg/24hr patch One '21mg'$  patch chest wall daily (may substitute generic)  . norethindrone-ethinyl estradiol (JUNEL FE 1/20) 1-20 MG-MCG tablet Take 1 tablet by mouth daily.  Marland Kitchen OLANZapine (ZYPREXA) 15 MG tablet Take 1 tablet (15 mg total) by mouth at bedtime.  Marland Kitchen omega-3 acid ethyl esters (LOVAZA) 1 g capsule Take 1 capsule by mouth twice daily  . ondansetron (ZOFRAN-ODT) 4 MG disintegrating tablet Take 1 tablet (4 mg total) by mouth every 6 (six) hours as needed for nausea.  . predniSONE (DELTASONE) 10 MG tablet Two tabs po day1,2; 1 tab po day3,4; 1/2 tab po day5,6  . primidone (MYSOLINE) 50 MG tablet Take 1 tablet (50 mg total) by mouth at bedtime.  . sertraline (ZOLOFT) 100 MG tablet Take 1 tablet (100 mg total) by mouth daily.  Marland Kitchen sulfamethoxazole-trimethoprim (BACTRIM DS) 800-160 MG tablet Take 2 tablets by mouth every 12 (twelve) hours for 9 doses.  . traZODone (DESYREL) 100 MG tablet Take 1.5 tablets (150 mg total) by mouth at bedtime. For sleep  . [DISCONTINUED] azithromycin (ZITHROMAX) 500 MG tablet One tab po daily for two more days   No facility-administered encounter medications on file as of 05/05/2019.        Objective:    LMP 11/26/2018 Comment: pt denies pregnancy-states no period in two years  Wt Readings from Last 3 Encounters:  04/30/19 182 lb 1.6 oz (82.6 kg)  03/19/19 182 lb 3.2 oz (82.6 kg)  03/16/19 184 lb (83.5 kg)    Physical Exam  No physical exam performed due to remote telephone encounter.  Physical Exam: Patient remotely monitored without video.  Verbal communication appropriate.  Cognition normal. Results for orders placed or performed during the hospital encounter of 04/26/19  Culture, blood (Routine x 2)   Specimen: BLOOD   Result Value Ref Range   Specimen Description BLOOD LAL    Special Requests      BOTTLES DRAWN AEROBIC AND ANAEROBIC Blood Culture adequate volume   Culture      NO GROWTH 5 DAYS Performed at [facility], [address] [place], [place] [address]    Report Status 05/01/2019 FINAL   Culture, blood (Routine x 2)   Specimen: BLOOD  Result Value Ref Range   Specimen Description BLOOD RFA    Special Requests      BOTTLES DRAWN AEROBIC AND ANAEROBIC Blood Culture adequate volume   Culture      NO GROWTH 5 DAYS Performed at [facility], [address], [place], [place] [address]    Report Status 05/01/2019 FINAL   Urine Culture   Specimen: Urine, Random  Result Value Ref Range   Specimen Description      URINE, RANDOM Performed at [facility], [address], [place], [place] [address]    Special Requests      NONE  Performed at [facility], [address] [place], [place] [address]    Culture MULTIPLE SPECIES PRESENT, SUGGEST RECOLLECTION (A)    Report Status 04/27/2019 FINAL   MRSA PCR Screening   Specimen: Nasal Mucosa; Nasopharyngeal  Result Value Ref Range   MRSA by PCR NEGATIVE NEGATIVE  Aerobic/Anaerobic Culture (surgical/deep wound)   Specimen: PATH Gallbladder; Body Fluid  Result Value Ref Range   Specimen Description      GALL BLADDER Performed at [facility], [place] [address] [place], [place] [address]    Special Requests      NONE Performed at [facility], [address] [place], [place] [address]    Gram Stain      ABUNDANT WBC PRESENT, PREDOMINANTLY PMN ABUNDANT GRAM NEGATIVE RODS MODERATE GRAM POSITIVE COCCI    Culture      ABUNDANT CITROBACTER BRAAKII MODERATE STAPHYLOCOCCUS AUREUS NO ANAEROBES ISOLATED Performed at [facility], [place] [address], [place], [place] [address]    Report Status 05/03/2019 FINAL    Organism ID, Bacteria CITROBACTER BRAAKII    Organism ID, Bacteria  STAPHYLOCOCCUS AUREUS       Susceptibility   Citrobacter braakii - MIC*    CEFAZOLIN RESISTANT Resistant     CEFEPIME 1 SENSITIVE Sensitive     CEFTAZIDIME 4 SENSITIVE Sensitive     CEFTRIAXONE 1 SENSITIVE Sensitive     CIPROFLOXACIN <=0.25 SENSITIVE Sensitive     GENTAMICIN <=1 SENSITIVE Sensitive     IMIPENEM <=0.25 SENSITIVE Sensitive     TRIMETH/SULFA <=20 SENSITIVE Sensitive     PIP/TAZO <=4 SENSITIVE Sensitive     * ABUNDANT CITROBACTER BRAAKII   Staphylococcus aureus - MIC*    CIPROFLOXACIN <=0.5 SENSITIVE Sensitive     ERYTHROMYCIN <=0.25 SENSITIVE Sensitive     GENTAMICIN <=0.5 SENSITIVE Sensitive     OXACILLIN 0.5 SENSITIVE Sensitive     TETRACYCLINE <=1 SENSITIVE Sensitive     VANCOMYCIN <=0.5 SENSITIVE Sensitive     TRIMETH/SULFA <=10 SENSITIVE Sensitive     CLINDAMYCIN <=0.25 SENSITIVE Sensitive     RIFAMPIN <=0.5 SENSITIVE Sensitive     Inducible Clindamycin NEGATIVE Sensitive     * MODERATE STAPHYLOCOCCUS AUREUS  Comprehensive metabolic panel  Result Value Ref Range   Sodium 130 (L) 135 - 145 mmol/L   Potassium 3.9 3.5 - 5.1 mmol/L   Chloride 96 (L) 98 - 111 mmol/L   CO2 23 22 - 32 mmol/L   Glucose, Bld 269 (H) 70 - 99 mg/dL   BUN 5 (L) 6 - 20 mg/dL   Creatinine, Ser 0.81 0.44 - 1.00 mg/dL   Calcium 8.9 8.9 - 10.3 mg/dL   Total Protein 7.7 6.5 - 8.1 g/dL   Albumin 3.4 (L) 3.5 - 5.0 g/dL   AST 19 15 - 41 U/L   ALT 21 0 - 44 U/L   Alkaline Phosphatase 120 38 - 126 U/L   Total Bilirubin 0.6 0.3 - 1.2 mg/dL   GFR calc non Af Amer >60 >60 mL/min   GFR calc Af Amer >60 >60 mL/min   Anion gap 11 5 - 15  Lactic acid, plasma  Result Value Ref Range   Lactic Acid, Venous 1.2 0.5 - 1.9 mmol/L  Lactic acid, plasma  Result Value Ref Range   Lactic Acid, Venous 0.9 0.5 - 1.9 mmol/L  CBC with Differential  Result Value Ref Range   WBC 17.8 (H) 4.0 - 10.5 K/uL   RBC 4.68 3.87 - 5.11 MIL/uL  Hemoglobin 13.7 12.0 - 15.0 g/dL   HCT 43.0 36.0 - 46.0 %   MCV 91.9  80.0 - 100.0 fL   MCH 29.3 26.0 - 34.0 pg   MCHC 31.9 30.0 - 36.0 g/dL   RDW 13.1 11.5 - 15.5 %   Platelets 356 150 - 400 K/uL   nRBC 0.0 0.0 - 0.2 %   Neutrophils Relative % 81 %   Neutro Abs 14.3 (H) 1.7 - 7.7 K/uL   Lymphocytes Relative 10 %   Lymphs Abs 1.7 0.7 - 4.0 K/uL   Monocytes Relative 6 %   Monocytes Absolute 1.1 (H) 0.1 - 1.0 K/uL   Eosinophils Relative 1 %   Eosinophils Absolute 0.2 0.0 - 0.5 K/uL   Basophils Relative 1 %   Basophils Absolute 0.2 (H) 0.0 - 0.1 K/uL   Immature Granulocytes 1 %   Abs Immature Granulocytes 0.24 (H) 0.00 - 0.07 K/uL  Protime-INR  Result Value Ref Range   Prothrombin Time 13.1 11.4 - 15.2 seconds   INR 1.0 0.8 - 1.2  Urinalysis, Complete w Microscopic  Result Value Ref Range   Color, Urine YELLOW (A) YELLOW   APPearance CLEAR (A) CLEAR   Specific Gravity, Urine 1.006 1.005 - 1.030   pH 7.0 5.0 - 8.0   Glucose, UA 50 (A) NEGATIVE mg/dL   Hgb urine dipstick NEGATIVE NEGATIVE   Bilirubin Urine NEGATIVE NEGATIVE   Ketones, ur NEGATIVE NEGATIVE mg/dL   Protein, ur NEGATIVE NEGATIVE mg/dL   Nitrite NEGATIVE NEGATIVE   Leukocytes,Ua NEGATIVE NEGATIVE   RBC / HPF 0-5 0 - 5 RBC/hpf   WBC, UA 0-5 0 - 5 WBC/hpf   Bacteria, UA RARE (A) NONE SEEN   Squamous Epithelial / LPF 0-5 0 - 5  Valproic acid level  Result Value Ref Range   Valproic Acid Lvl 103 (H) 50.0 - 100.0 ug/mL  Lipase, blood  Result Value Ref Range   Lipase 26 11 - 51 U/L  HIV Antibody (routine testing w rflx)  Result Value Ref Range   HIV Screen 4th Generation wRfx NON REACTIVE NON REACTIVE  Comprehensive metabolic panel  Result Value Ref Range   Sodium 141 135 - 145 mmol/L   Potassium 3.7 3.5 - 5.1 mmol/L   Chloride 107 98 - 111 mmol/L   CO2 25 22 - 32 mmol/L   Glucose, Bld 118 (H) 70 - 99 mg/dL   BUN 6 6 - 20 mg/dL   Creatinine, Ser 0.62 0.44 - 1.00 mg/dL   Calcium 8.4 (L) 8.9 - 10.3 mg/dL   Total Protein 6.3 (L) 6.5 - 8.1 g/dL   Albumin 2.6 (L) 3.5 - 5.0 g/dL     AST 19 15 - 41 U/L   ALT 16 0 - 44 U/L   Alkaline Phosphatase 97 38 - 126 U/L   Total Bilirubin 0.4 0.3 - 1.2 mg/dL   GFR calc non Af Amer >60 >60 mL/min   GFR calc Af Amer >60 >60 mL/min   Anion gap 9 5 - 15  Phosphorus  Result Value Ref Range   Phosphorus 4.5 2.5 - 4.6 mg/dL  CBC  Result Value Ref Range   WBC 12.0 (H) 4.0 - 10.5 K/uL   RBC 4.15 3.87 - 5.11 MIL/uL   Hemoglobin 12.3 12.0 - 15.0 g/dL   HCT 38.1 36.0 - 46.0 %   MCV 91.8 80.0 - 100.0 fL   MCH 29.6 26.0 - 34.0 pg   MCHC 32.3 30.0 - 36.0 g/dL  RDW 13.3 11.5 - 15.5 %   Platelets 312 150 - 400 K/uL   nRBC 0.0 0.0 - 0.2 %  Hemoglobin A1c  Result Value Ref Range   Hgb A1c MFr Bld 6.1 (H) 4.8 - 5.6 %   Mean Plasma Glucose 128.37 mg/dL  Glucose, capillary  Result Value Ref Range   Glucose-Capillary 148 (H) 70 - 99 mg/dL  Glucose, capillary  Result Value Ref Range   Glucose-Capillary 136 (H) 70 - 99 mg/dL  Glucose, capillary  Result Value Ref Range   Glucose-Capillary 106 (H) 70 - 99 mg/dL  Glucose, capillary  Result Value Ref Range   Glucose-Capillary 109 (H) 70 - 99 mg/dL  Glucose, capillary  Result Value Ref Range   Glucose-Capillary 189 (H) 70 - 99 mg/dL  Glucose, capillary  Result Value Ref Range   Glucose-Capillary 201 (H) 70 - 99 mg/dL   Comment 1 Notify RN   CBC  Result Value Ref Range   WBC 12.5 (H) 4.0 - 10.5 K/uL   RBC 4.00 3.87 - 5.11 MIL/uL   Hemoglobin 11.7 (L) 12.0 - 15.0 g/dL   HCT 36.6 36.0 - 46.0 %   MCV 91.5 80.0 - 100.0 fL   MCH 29.3 26.0 - 34.0 pg   MCHC 32.0 30.0 - 36.0 g/dL   RDW 13.5 11.5 - 15.5 %   Platelets 311 150 - 400 K/uL   nRBC 0.0 0.0 - 0.2 %  Comprehensive metabolic panel  Result Value Ref Range   Sodium 141 135 - 145 mmol/L   Potassium 4.0 3.5 - 5.1 mmol/L   Chloride 109 98 - 111 mmol/L   CO2 25 22 - 32 mmol/L   Glucose, Bld 135 (H) 70 - 99 mg/dL   BUN 5 (L) 6 - 20 mg/dL   Creatinine, Ser 0.68 0.44 - 1.00 mg/dL   Calcium 8.0 (L) 8.9 - 10.3 mg/dL   Total  Protein 6.1 (L) 6.5 - 8.1 g/dL   Albumin 2.5 (L) 3.5 - 5.0 g/dL   AST 40 15 - 41 U/L   ALT 24 0 - 44 U/L   Alkaline Phosphatase 97 38 - 126 U/L   Total Bilirubin 0.3 0.3 - 1.2 mg/dL   GFR calc non Af Amer >60 >60 mL/min   GFR calc Af Amer >60 >60 mL/min   Anion gap 7 5 - 15  Magnesium  Result Value Ref Range   Magnesium 2.3 1.7 - 2.4 mg/dL  Phosphorus  Result Value Ref Range   Phosphorus 3.6 2.5 - 4.6 mg/dL  Glucose, capillary  Result Value Ref Range   Glucose-Capillary 350 (H) 70 - 99 mg/dL   Comment 1 Notify RN   Glucose, capillary  Result Value Ref Range   Glucose-Capillary 269 (H) 70 - 99 mg/dL  Glucose, capillary  Result Value Ref Range   Glucose-Capillary 113 (H) 70 - 99 mg/dL   Comment 1 Notify RN   Glucose, capillary  Result Value Ref Range   Glucose-Capillary 111 (H) 70 - 99 mg/dL  Glucose, capillary  Result Value Ref Range   Glucose-Capillary 223 (H) 70 - 99 mg/dL  Glucose, capillary  Result Value Ref Range   Glucose-Capillary 126 (H) 70 - 99 mg/dL  Glucose, capillary  Result Value Ref Range   Glucose-Capillary 153 (H) 70 - 99 mg/dL   Comment 1 Notify RN   Glucose, capillary  Result Value Ref Range   Glucose-Capillary 147 (H) 70 - 99 mg/dL  CBC  Result Value Ref Range  WBC 13.9 (H) 4.0 - 10.5 K/uL   RBC 4.09 3.87 - 5.11 MIL/uL   Hemoglobin 12.0 12.0 - 15.0 g/dL   HCT 37.3 36.0 - 46.0 %   MCV 91.2 80.0 - 100.0 fL   MCH 29.3 26.0 - 34.0 pg   MCHC 32.2 30.0 - 36.0 g/dL   RDW 13.8 11.5 - 15.5 %   Platelets 358 150 - 400 K/uL   nRBC 0.0 0.0 - 0.2 %  Comprehensive metabolic panel  Result Value Ref Range   Sodium 139 135 - 145 mmol/L   Potassium 3.5 3.5 - 5.1 mmol/L   Chloride 100 98 - 111 mmol/L   CO2 28 22 - 32 mmol/L   Glucose, Bld 135 (H) 70 - 99 mg/dL   BUN 6 6 - 20 mg/dL   Creatinine, Ser 0.65 0.44 - 1.00 mg/dL   Calcium 8.2 (L) 8.9 - 10.3 mg/dL   Total Protein 6.4 (L) 6.5 - 8.1 g/dL   Albumin 2.3 (L) 3.5 - 5.0 g/dL   AST 37 15 - 41 U/L    ALT 23 0 - 44 U/L   Alkaline Phosphatase 115 38 - 126 U/L   Total Bilirubin 0.5 0.3 - 1.2 mg/dL   GFR calc non Af Amer >60 >60 mL/min   GFR calc Af Amer >60 >60 mL/min   Anion gap 11 5 - 15  Lactic acid, plasma  Result Value Ref Range   Lactic Acid, Venous 1.1 0.5 - 1.9 mmol/L  Lactic acid, plasma  Result Value Ref Range   Lactic Acid, Venous 1.0 0.5 - 1.9 mmol/L  Blood gas, arterial  Result Value Ref Range   FIO2 0.40    Delivery systems NASAL CANNULA    pH, Arterial 7.47 (H) 7.350 - 7.450   pCO2 arterial 44 32.0 - 48.0 mmHg   pO2, Arterial 60 (L) 83.0 - 108.0 mmHg   Bicarbonate 32.0 (H) 20.0 - 28.0 mmol/L   Acid-Base Excess 7.4 (H) 0.0 - 2.0 mmol/L   O2 Saturation 92.2 %   Patient temperature 37.0    Collection site RIGHT RADIAL    Sample type ARTERIAL DRAW    Allens test (pass/fail) PASS PASS  Protime-INR  Result Value Ref Range   Prothrombin Time 14.4 11.4 - 15.2 seconds   INR 1.1 0.8 - 1.2  Brain natriuretic peptide  Result Value Ref Range   B Natriuretic Peptide 172.0 (H) 0.0 - 100.0 pg/mL  Glucose, capillary  Result Value Ref Range   Glucose-Capillary 130 (H) 70 - 99 mg/dL  Procalcitonin - Baseline  Result Value Ref Range   Procalcitonin 1.42 ng/mL  Glucose, capillary  Result Value Ref Range   Glucose-Capillary 124 (H) 70 - 99 mg/dL  Glucose, capillary  Result Value Ref Range   Glucose-Capillary 250 (H) 70 - 99 mg/dL  Glucose, capillary  Result Value Ref Range   Glucose-Capillary 321 (H) 70 - 99 mg/dL  Procalcitonin  Result Value Ref Range   Procalcitonin 0.65 ng/mL  Glucose, capillary  Result Value Ref Range   Glucose-Capillary 267 (H) 70 - 99 mg/dL  CBC  Result Value Ref Range   WBC 15.8 (H) 4.0 - 10.5 K/uL   RBC 3.86 (L) 3.87 - 5.11 MIL/uL   Hemoglobin 11.2 (L) 12.0 - 15.0 g/dL   HCT 35.3 (L) 36.0 - 46.0 %   MCV 91.5 80.0 - 100.0 fL   MCH 29.0 26.0 - 34.0 pg   MCHC 31.7 30.0 - 36.0 g/dL   RDW  13.6 11.5 - 15.5 %   Platelets 371 150 - 400 K/uL     nRBC 0.0 0.0 - 0.2 %  Basic metabolic panel  Result Value Ref Range   Sodium 139 135 - 145 mmol/L   Potassium 3.5 3.5 - 5.1 mmol/L   Chloride 98 98 - 111 mmol/L   CO2 31 22 - 32 mmol/L   Glucose, Bld 211 (H) 70 - 99 mg/dL   BUN 12 6 - 20 mg/dL   Creatinine, Ser 0.74 0.44 - 1.00 mg/dL   Calcium 8.5 (L) 8.9 - 10.3 mg/dL   GFR calc non Af Amer >60 >60 mL/min   GFR calc Af Amer >60 >60 mL/min   Anion gap 10 5 - 15  Brain natriuretic peptide  Result Value Ref Range   B Natriuretic Peptide 123.0 (H) 0.0 - 100.0 pg/mL  Glucose, capillary  Result Value Ref Range   Glucose-Capillary 224 (H) 70 - 99 mg/dL  Glucose, capillary  Result Value Ref Range   Glucose-Capillary 189 (H) 70 - 99 mg/dL  Glucose, capillary  Result Value Ref Range   Glucose-Capillary 178 (H) 70 - 99 mg/dL  Magnesium  Result Value Ref Range   Magnesium 2.7 (H) 1.7 - 2.4 mg/dL  Phosphorus  Result Value Ref Range   Phosphorus 5.8 (H) 2.5 - 4.6 mg/dL  Glucose, capillary  Result Value Ref Range   Glucose-Capillary 274 (H) 70 - 99 mg/dL  Glucose, capillary  Result Value Ref Range   Glucose-Capillary 322 (H) 70 - 99 mg/dL  Procalcitonin  Result Value Ref Range   Procalcitonin 0.36 ng/mL  Basic metabolic panel  Result Value Ref Range   Sodium 139 135 - 145 mmol/L   Potassium 4.5 3.5 - 5.1 mmol/L   Chloride 101 98 - 111 mmol/L   CO2 28 22 - 32 mmol/L   Glucose, Bld 221 (H) 70 - 99 mg/dL   BUN 15 6 - 20 mg/dL   Creatinine, Ser 0.54 0.44 - 1.00 mg/dL   Calcium 8.9 8.9 - 10.3 mg/dL   GFR calc non Af Amer >60 >60 mL/min   GFR calc Af Amer >60 >60 mL/min   Anion gap 10 5 - 15  Magnesium  Result Value Ref Range   Magnesium 2.4 1.7 - 2.4 mg/dL  Glucose, capillary  Result Value Ref Range   Glucose-Capillary 144 (H) 70 - 99 mg/dL  Glucose, capillary  Result Value Ref Range   Glucose-Capillary 235 (H) 70 - 99 mg/dL  Glucose, capillary  Result Value Ref Range   Glucose-Capillary 125 (H) 70 - 99 mg/dL   Glucose, capillary  Result Value Ref Range   Glucose-Capillary 149 (H) 70 - 99 mg/dL  Glucose, capillary  Result Value Ref Range   Glucose-Capillary 189 (H) 70 - 99 mg/dL  Basic metabolic panel  Result Value Ref Range   Sodium 139 135 - 145 mmol/L   Potassium 3.7 3.5 - 5.1 mmol/L   Chloride 100 98 - 111 mmol/L   CO2 27 22 - 32 mmol/L   Glucose, Bld 116 (H) 70 - 99 mg/dL   BUN 19 6 - 20 mg/dL   Creatinine, Ser 0.64 0.44 - 1.00 mg/dL   Calcium 8.7 (L) 8.9 - 10.3 mg/dL   GFR calc non Af Amer >60 >60 mL/min   GFR calc Af Amer >60 >60 mL/min   Anion gap 12 5 - 15  Glucose, capillary  Result Value Ref Range   Glucose-Capillary 124 (H) 70 - 99  mg/dL  Glucose, capillary  Result Value Ref Range   Glucose-Capillary 139 (H) 70 - 99 mg/dL  Glucose, capillary  Result Value Ref Range   Glucose-Capillary 186 (H) 70 - 99 mg/dL  POC SARS Coronavirus 2 Ag  Result Value Ref Range   SARS Coronavirus 2 Ag NEGATIVE NEGATIVE  ECHOCARDIOGRAM COMPLETE  Result Value Ref Range   Weight 2,860.69 oz   Height 62 in   BP 127/52 mmHg  Surgical pathology  Result Value Ref Range   SURGICAL PATHOLOGY      SURGICAL PATHOLOGY CASE: [id number] PATIENT: Mindel Fabrizio Surgical Pathology Report     Specimen Submitted: A. Gallbladder  Clinical History: Cholecystitis      DIAGNOSIS: A. GALLBLADDER, CHOLECYSTECTOMY: - GANGRENOUS CHOLECYSTITIS WITH CHOLELITHIASIS. - NEGATIVE FOR MALIGNANCY.   GROSS DESCRIPTION: A. Labeled: Gallbladder Received: In formalin Size of specimen: 8 x 3 x 3 cm Specimen integrity: Disrupted and torn External surface: Dark red, eroded, roughened, and covered by a small amount of dark red to tan exudate Wall thickness: From 0.4 up to 1.1 cm Mucosa: Tan-red, velvety in some areas, and eroded and others. Cystic duct: 0.7 cm Bile present: Yes, mucoid Stones present: Several black calculi from 0.5 up to 1.1 cm in diameter Other findings: Separately submitted in  the same container is a piece of pink-red fibrofatty tissue measuring 9.8 x 6.5 x 3 cm, which is covered by a small amount of white exudate.  Block summary: 1-sections of gal lbladder 2-sections of separately submitted tissue   Final Diagnosis performed by Betsy Pries, MD.   Electronically signed 04/29/2019 8:21:03AM The electronic signature indicates that the named Attending Pathologist has evaluated the specimen Technical component performed at [facility], [address] [place], [place] [address] Lab: [phone] Dir: Rush Farmer, MD, MMM  Professional component performed at [facility], [facility], [place], [place], [place] [address] Lab: [phone] Dir: Dellia Nims. Rubinas, MD   Troponin I (High Sensitivity)  Result Value Ref Range   Troponin I (High Sensitivity) 64 (H) <18 ng/L  Troponin I (High Sensitivity)  Result Value Ref Range   Troponin I (High Sensitivity) 53 (H) <18 ng/L      Assessment & Plan:   Problem List Items Addressed This Visit      Respiratory   ARDS (adult respiratory distress syndrome) ([place])    TCM Hopsital follow up 05/05/2019 via virtual visit.  Patient without concerns or medication refills.  Confirmed will be following with pulmonary for re-evaluation and 6 minute walk test if there is continued need for oxygen, appointment already scheduled.  Has consults scheduled with CCM for needs as well as post-operative appointment 05/07/2019 with surgeon from gallbladder surgery.  Plan: 1) Contact us with any additional needs/questions/concerns 2) Keep your scheduled post-operative appointment and visit with pulmonary 3) We will see you back in clinic in 2-3 months for your next follow up on your chronic conditions         No orders of the defined types were placed in this encounter.  I have reviewed the admission H&P, hospital notes, discharge summary, discharge medication list, and have reconciled the current and discharge  medications today.  Follow up plan: Return in about 3 months (around 08/05/2019) for HTN, Hyperlipidemia, GERD F/U.  Harlin Rain, FNP-C Family Nurse Practitioner [facility] 05/05/2019, 10:16 AM

## 2019-05-05 NOTE — Patient Instructions (Signed)
Keep your regularly scheduled follow up with your surgeon on 05/07/2019 for post-operative evaluation and with pulmonary on 05/12/2019 for re-evaluation for need of oxygen.  Continue your current medications, as directed, at time of hospital discharge.  If you have any worsening of symptoms to contact our office for an office visit, or after hours to call the on call provider/proceed to the emergency room for evaluation.  We will plan to see you back in 2-3 months for follow up on your hypertension, hyperlipidemia, DM and GERD, or sooner, should the need arise.  You will receive a survey after today's visit either digitally by e-mail or paper by [employer]. Your experiences and feedback matter to [place].  Please respond so we know how we are doing as we provide care for you.  Call us with any questions/concerns/needs.  It is my goal to be available to you for your health concerns.  Thanks for choosing me to be a partner in your healthcare needs!  Harlin Rain, FNP-C Family Nurse Practitioner [facility] Phone: [phone]

## 2019-05-05 NOTE — Assessment & Plan Note (Signed)
TCM Hopsital follow up 05/05/2019 via virtual visit.  Patient without concerns or medication refills.  Confirmed will be following with pulmonary for re-evaluation and 6 minute walk test if there is continued need for oxygen, appointment already scheduled.  Has consults scheduled with CCM for needs as well as post-operative appointment 05/07/2019 with surgeon from gallbladder surgery.  Plan: 1) Contact us with any additional needs/questions/concerns 2) Keep your scheduled post-operative appointment and visit with pulmonary 3) We will see you back in clinic in 2-3 months for your next follow up on your chronic conditions

## 2019-05-05 NOTE — Telephone Encounter (Signed)
Patient is calling and has some questions patient just recently had surgery. Please call patient and advise.

## 2019-05-05 NOTE — Telephone Encounter (Signed)
Called pt back. She states she had a Lap Choley on 04/27/19 performed by Dr Celine Ahr. States she "has a hole on her Right side from where they took the drain out". Per pt they did not give her instructions at the hospital on what to do about it. Pt denies drainage, fevers and chills. Advised pt to cover the area with a dry gauze once a day until it starts closing on its own. Pt questioned about showering. Advised pt she can shower and let soap and water run down and then use a dry gauze to cover until it healed up. Pt voiced understanding and has no further questions at this time.

## 2019-05-07 ENCOUNTER — Other Ambulatory Visit: Payer: Self-pay

## 2019-05-07 ENCOUNTER — Ambulatory Visit (INDEPENDENT_AMBULATORY_CARE_PROVIDER_SITE_OTHER): Payer: Self-pay | Admitting: General Surgery

## 2019-05-07 ENCOUNTER — Encounter: Payer: Self-pay | Admitting: General Surgery

## 2019-05-07 VITALS — BP 109/68 | HR 98 | Temp 97.3°F | Resp 14 | Wt 174.4 lb

## 2019-05-07 DIAGNOSIS — Z9049 Acquired absence of other specified parts of digestive tract: Secondary | ICD-10-CM

## 2019-05-07 NOTE — Patient Instructions (Signed)
Make sure to use your oxygen 24/7 while your lungs are recovering. Make sure to keep your appointment with Pulmonology on 05/12/19.   You may wash the incision sites with soap and water.   Follow up as needed. Call the office if you have any questions or concerns.   Laparoscopic Cholecystectomy, Care After This sheet gives you information about how to care for yourself after your procedure. Your doctor may also give you more specific instructions. If you have problems or questions, contact your doctor. Follow these instructions at home: Care for cuts from surgery (incisions)   Follow instructions from your doctor about how to take care of your cuts from surgery. Make sure you: ? Wash your hands with soap and water before you change your bandage (dressing). If you cannot use soap and water, use hand sanitizer. ? Change your bandage as told by your doctor. ? Leave stitches (sutures), skin glue, or skin tape (adhesive) strips in place. They may need to stay in place for 2 weeks or longer. If tape strips get loose and curl up, you may trim the loose edges. Do not remove tape strips completely unless your doctor says it is okay.  Do not take baths, swim, or use a hot tub until your doctor says it is okay. Ask your doctor if you can take showers. You may only be allowed to take sponge baths for bathing.  Check your surgical cut area every day for signs of infection. Check for: ? More redness, swelling, or pain. ? More fluid or blood. ? Warmth. ? Pus or a bad smell. Activity  Do not drive or use heavy machinery while taking prescription pain medicine.  Do not lift anything that is heavier than 10 lb (4.5 kg) until your doctor says it is okay.  Do not play contact sports until your doctor says it is okay.  Do not drive for 24 hours if you were given a medicine to help you relax (sedative).  Rest as needed. Do not return to work or school until your doctor says it is okay. General  instructions  Take over-the-counter and prescription medicines only as told by your doctor.  To prevent or treat constipation while you are taking prescription pain medicine, your doctor may recommend that you: ? Drink enough fluid to keep your pee (urine) clear or pale yellow. ? Take over-the-counter or prescription medicines. ? Eat foods that are high in fiber, such as fresh fruits and vegetables, whole grains, and beans. ? Limit foods that are high in fat and processed sugars, such as fried and sweet foods. Contact a doctor if:  You develop a rash.  You have more redness, swelling, or pain around your surgical cuts.  You have more fluid or blood coming from your surgical cuts.  Your surgical cuts feel warm to the touch.  You have pus or a bad smell coming from your surgical cuts.  You have a fever.  One or more of your surgical cuts breaks open. Get help right away if:  You have trouble breathing.  You have chest pain.  You have pain that is getting worse in your shoulders.  You faint or feel dizzy when you stand.  You have very bad pain in your belly (abdomen).  You are sick to your stomach (nauseous) for more than one day.  You have throwing up (vomiting) that lasts for more than one day.  You have leg pain. This information is not intended to replace advice given  to you by your health care provider. Make sure you discuss any questions you have with your health care provider. Document Revised: 01/04/2017 Document Reviewed: 07/11/2015 Elsevier Patient Education  [place].

## 2019-05-07 NOTE — Progress Notes (Signed)
Vanessa Baker is here today for a postoperative visit.  She is a 44 year old woman who presented to the emergency department with abdominal pain.  She was found to have severe acute on chronic cholecystitis.  She underwent an uncomplicated but extremely difficult laparoscopic cholecystectomy.  After the operation, however, she had respiratory compromise and ended up in the intensive care unit for management of ARDS.  She was subsequently discharged home on supplemental oxygen.  She reports frequently becoming short of breath at home, but she is not wearing her oxygen today.  She states that she is still fairly sore over on the right side of her body.  She has not had any fevers or chills.  No nausea or vomiting.  She is currently in the process of quitting smoking and feels like this is contributing to her decreased appetite.  She is having normal bowel movements.  She has noticed some drainage coming from the old drain site as well as the periumbilical port site.  Today's Vitals   05/07/19 1356  BP: 109/68  Pulse: 98  Resp: 14  Temp: (!) 97.3 F (36.3 C)  SpO2: 98%  Weight: 174 lb 6.4 oz (79.1 kg)  PainSc: 8    Body mass index is 31.9 kg/m. Focused abdominal exam: I removed a small dressing from the previous drain site.  The site is still open at the skin, however it appears closed at the deeper layers.  There is no erythema, induration, or purulent drainage.  There is a small amount of serous discharge present on the dressing.  Her other to right upper quadrant port sites are healing nicely and currently have scabs.  At the umbilicus, there is a little bit of fibrinous exudate, but again, no erythema, induration, or purulent discharge present.  Impression and plan: This is a 44 year old woman who underwent a laparoscopic cholecystectomy on April 27, 2019.  She is doing well from a surgical standpoint.  She was reassured that her pain would continue to improve with time.  She was advised to  shower daily and clean the port sites with warm soapy water.  She may keep a dressing over the old drain site to minimize soilage on her clothing.  She may apply bacitracin to the umbilical site, if desired.  She was encouraged to wear her oxygen on a regular basis until she is able to be reevaluated by pulmonary medicine.  We will see her on an as-needed basis.

## 2019-05-12 ENCOUNTER — Institutional Professional Consult (permissible substitution): Payer: Medicare HMO | Admitting: Pulmonary Disease

## 2019-05-15 ENCOUNTER — Institutional Professional Consult (permissible substitution): Payer: Medicare HMO | Admitting: Internal Medicine

## 2019-05-26 ENCOUNTER — Ambulatory Visit: Payer: Medicare HMO | Admitting: Gastroenterology

## 2019-06-04 ENCOUNTER — Encounter: Payer: Self-pay | Admitting: Psychiatry

## 2019-06-04 ENCOUNTER — Other Ambulatory Visit: Payer: Self-pay

## 2019-06-04 ENCOUNTER — Telehealth (INDEPENDENT_AMBULATORY_CARE_PROVIDER_SITE_OTHER): Payer: Medicare HMO | Admitting: Psychiatry

## 2019-06-04 DIAGNOSIS — F3176 Bipolar disorder, in full remission, most recent episode depressed: Secondary | ICD-10-CM

## 2019-06-04 DIAGNOSIS — G2581 Restless legs syndrome: Secondary | ICD-10-CM | POA: Diagnosis not present

## 2019-06-04 DIAGNOSIS — F1421 Cocaine dependence, in remission: Secondary | ICD-10-CM

## 2019-06-04 DIAGNOSIS — F1011 Alcohol abuse, in remission: Secondary | ICD-10-CM

## 2019-06-04 DIAGNOSIS — F4310 Post-traumatic stress disorder, unspecified: Secondary | ICD-10-CM

## 2019-06-04 DIAGNOSIS — Z79899 Other long term (current) drug therapy: Secondary | ICD-10-CM

## 2019-06-04 DIAGNOSIS — G2111 Neuroleptic induced parkinsonism: Secondary | ICD-10-CM | POA: Diagnosis not present

## 2019-06-04 DIAGNOSIS — F1211 Cannabis abuse, in remission: Secondary | ICD-10-CM

## 2019-06-04 DIAGNOSIS — R69 Illness, unspecified: Secondary | ICD-10-CM | POA: Diagnosis not present

## 2019-06-04 DIAGNOSIS — F17201 Nicotine dependence, unspecified, in remission: Secondary | ICD-10-CM

## 2019-06-04 MED ORDER — SERTRALINE HCL 100 MG PO TABS: 100.0000 mg | ORAL_TABLET | Freq: Every day | ORAL | 0 refills | Status: DC

## 2019-06-04 MED ORDER — BENZTROPINE MESYLATE 1 MG PO TABS: 1.0000 mg | ORAL_TABLET | Freq: Three times a day (TID) | ORAL | 0 refills | Status: DC

## 2019-06-04 MED ORDER — OLANZAPINE 15 MG PO TABS: 15.0000 mg | ORAL_TABLET | Freq: Every day | ORAL | 0 refills | Status: DC

## 2019-06-04 NOTE — Progress Notes (Signed)
Provider Location : [facility] Patient Location : Home  Virtual Visit via Video Note  I connected with Vanessa Baker on 06/04/19 at  3:00 PM EDT by a video enabled telemedicine application and verified that I am speaking with the correct person using two identifiers.   I discussed the limitations of evaluation and management by telemedicine and the availability of in person appointments. The patient expressed understanding and agreed to proceed.    I discussed the assessment and treatment plan with the patient. The patient was provided an opportunity to ask questions and all were answered. The patient agreed with the plan and demonstrated an understanding of the instructions.   The patient was advised to call back or seek an in-person evaluation if the symptoms worsen or if the condition fails to improve as anticipated.   [place] MD OP Progress Note  06/04/2019 6:05 PM Vanessa Baker  MRN:  [id number]  Chief Complaint:  Chief Complaint    Follow-up     HPI: Vanessa Baker is a 44 year old Caucasian female, divorced, lives in [place], has a history of bipolar disorder, PTSD, neuroleptic induced parkinsonism, cocaine use disorder in remission, tobacco use disorder, cannabis use disorder, hyperlipidemia, seizure disorder, IBS was evaluated by telemedicine today.  Patient reports she was recently admitted to the hospital and underwent surgery.  Per review of medical records in [facility] dated 04/26/2019 per-Dr. Delfino Lovett Weiting-patient had cholecystectomy done.  Patient today reports she is recovering well.  She reports her mood symptoms are stable.  She however reports she does have psychosocial stressors of her husband who also underwent surgery of his hand.  She reports that has put more stress on her.  She however is coping okay.  She is compliant on her medications as prescribed.  She denies side effects.  Patient reports she quit smoking in March.  Patient denies any suicidality,  homicidality or perceptual disturbances.  Patient reports due to the surgery and hospital admission she has been unable to get her lithium levels done.  She however agrees to get it done soon.  Patient denies any other concerns today. Visit Diagnosis:    ICD-10-CM   1. Bipolar disorder, in full remission, most recent episode depressed ([place])  F31.76   2. PTSD (post-traumatic stress disorder)  F43.10 OLANZapine (ZYPREXA) 15 MG tablet    sertraline (ZOLOFT) 100 MG tablet   improving  3. Neuroleptic induced Parkinsonism (HCC)  G21.11 benztropine (COGENTIN) 1 MG tablet  4. Restless leg syndrome  G25.81   5. Cocaine use disorder, moderate, in sustained remission (HCC)  F14.21   6. Alcohol use disorder, mild, in early remission  F10.11   7. Cannabis use disorder, mild, in early remission  F12.11   8. Severe tobacco use disorder, in early remission  F17.201   9. High risk medication use  Z79.899     Past Psychiatric History: I have reviewed past psychiatric history from my progress note on 04/02/2017.  Past trials of Seroquel, Latuda, Ambien, Klonopin, Topamax, gabapentin, Trintellix, Abilify, Rexulti, Artane.  Past Medical History:  Past Medical History:  Diagnosis Date  . Allergy   . Anxiety   . Asthma   . Bipolar disorder ([place])   . Bursitis   . Depression   . Diabetes mellitus without complication ([place])   . Essential hypertension 03/28/2017  . Frequent headaches   . GERD (gastroesophageal reflux disease)   . Headache   . High cholesterol   . Hypertension   . Irritable bowel syndrome (IBS)   .  Irritable colon 02/23/2010  . Migraine 04/02/2017  . Obesity (BMI 30-39.9) 11/25/2013  . Seizure ([place]) [id number]  . Seizures ([place])   . Tremor     Past Surgical History:  Procedure Laterality Date  . CHOLECYSTECTOMY N/A 04/27/2019   Procedure: LAPAROSCOPIC CHOLECYSTECTOMY;  Surgeon: Fredirick Maudlin, MD;  Location: [facility] ORS;  Service: General;  Laterality: N/A;  . NO PAST SURGERIES       Family Psychiatric History: I have reviewed family psychiatric history from my progress note on 04/02/2017.  Family History:  Family History  Problem Relation Age of Onset  . Bipolar disorder Mother   . Schizophrenia Mother   . Hypertension Mother   . Diabetes Mother   . Cancer Mother   . Anxiety disorder Mother   . ADD / ADHD Mother   . Alcohol abuse Mother   . Drug abuse Mother   . Bipolar disorder Sister   . Anxiety disorder Sister   . Drug abuse Sister   . Bipolar disorder Brother   . Anxiety disorder Brother   . Drug abuse Brother   . Hypertension Father   . Cancer Father   . Breast cancer Maternal Aunt   . Breast cancer Paternal Aunt   . Breast cancer Maternal Grandmother   . Alcohol abuse Maternal Grandmother   . Breast cancer Paternal Grandmother   . Parkinson's disease Maternal Grandfather   . Alcohol abuse Maternal Grandfather   . Alcohol abuse Maternal Uncle   . ADD / ADHD Son   . Tremor Neg Hx     Social History: I have reviewed social history from my progress note on 04/02/2017. Social History   Socioeconomic History  . Marital status: Married    Spouse name: dewey   . Number of children: 1  . Years of education: 24  . Highest education level: Associate degree: occupational, Hotel manager, or vocational program  Occupational History  . Occupation: disability  Tobacco Use  . Smoking status: Former Smoker    Packs/day: 0.50    Years: 27.00    Pack years: 13.50    Types: Cigarettes    Quit date: 04/26/2019    Years since quitting: 0.1  . Smokeless tobacco: Never Used  Substance and Sexual Activity  . Alcohol use: Not Currently  . Drug use: No  . Sexual activity: Yes    Partners: Male    Birth control/protection: None  Other Topics Concern  . Not on file  Social History Narrative   Lives with husband, [name] (on [place])   Right-handed   Caffeine use: 1 cup coffee a day, 2 soft drinks per day   Social Determinants of Health    Financial Resource Strain:   . Difficulty of Paying Living Expenses:   Food Insecurity:   . Worried About Charity fundraiser in the Last Year:   . Arboriculturist in the Last Year:   Transportation Needs:   . Film/video editor (Medical):   Marland Kitchen Lack of Transportation (Non-Medical):   Physical Activity: Insufficiently Active  . Days of Exercise per Week: 3 days  . Minutes of Exercise per Session: 30 min  Stress:   . Feeling of Stress :   Social Connections:   . Frequency of Communication with Friends and Family:   . Frequency of Social Gatherings with Friends and Family:   . Attends Religious Services:   . Active Member of Clubs or Organizations:   . Attends Archivist Meetings:   .  Marital Status:     Allergies:  Allergies  Allergen Reactions  . Amitriptyline Other (See Comments)    Confusion, paralysis    Metabolic Disorder Labs: Lab Results  Component Value Date   HGBA1C 6.1 (H) 04/27/2019   MPG 128.37 04/27/2019   MPG 105 12/19/2018   Lab Results  Component Value Date   PROLACTIN 4.6 (L) 04/22/2017   PROLACTIN 10.2 11/10/2015   Lab Results  Component Value Date   CHOL 112 12/19/2018   TRIG 172 (H) 12/19/2018   HDL 33 (L) 12/19/2018   CHOLHDL 3.4 12/19/2018   LDLCALC 54 12/19/2018   [place]  03/05/2018     Comment:     . LDL cholesterol not calculated. Triglyceride levels greater than 400 mg/dL invalidate calculated LDL results. . Reference range: <100 . Desirable range <100 mg/dL for primary prevention;   <70 mg/dL for patients with CHD or diabetic patients  with > or = 2 CHD risk factors. Marland Kitchen LDL-C is now calculated using the Martin-Hopkins  calculation, which is a validated novel method providing  better accuracy than the Friedewald equation in the  estimation of LDL-C.  Cresenciano Genre et al. Annamaria Helling. 6301;601(09): 2061-2068  (http://education.Quest[employer].com/faq/FAQ164)    Lab Results  Component Value Date   TSH 2.928 11/14/2018    TSH 2.08 11/19/2017    Therapeutic Level Labs: Lab Results  Component Value Date   LITHIUM 0.32 (L) 11/14/2018   LITHIUM 0.33 (L) 06/19/2018   Lab Results  Component Value Date   VALPROATE 103 (H) 04/26/2019   VALPROATE 61.4 08/13/2017   No components found for:  CBMZ  Current Medications: Current Outpatient Medications  Medication Sig Dispense Refill  . ACCU-CHEK AVIVA PLUS test strip USE 1 STRIP TO CHECK GLUCOSE UP TO 3 TIMES DAILY AS DIRECTED. 300 each 1  . Accu-Chek Softclix Lancets lancets Use to check blood sugar up to 3 times daily as instructed 300 each 1  . acetaminophen (TYLENOL) 500 MG tablet Take 500 mg by mouth every 6 (six) hours as needed.    Marland Kitchen albuterol (PROAIR HFA) 108 (90 Base) MCG/ACT inhaler Inhale 2 puffs into the lungs every 4 (four) hours as needed for wheezing or shortness of breath. 8 g 5  . atorvastatin (LIPITOR) 40 MG tablet Take 1 tablet (40 mg total) by mouth daily. 90 tablet 1  . baclofen (LIORESAL) 10 MG tablet Take 1 tablet (10 mg total) by mouth 3 (three) times daily as needed for muscle spasms. 90 tablet 0  . benztropine (COGENTIN) 1 MG tablet Take 1 tablet (1 mg total) by mouth 3 (three) times daily. 270 tablet 0  . blood glucose meter kit and supplies Dispense based on patient and insurance preference. Use up to four times daily as directed. (FOR ICD-10 E10.9, E11.9). 1 each 0  . Blood Glucose Monitoring Suppl (ACCU-CHEK AVIVA PLUS) w/Device KIT Use to check blood sugar up to 3 times daily 1 kit 0  . Calcium Carb-Cholecalciferol (CALCIUM 600+D3 PO) Take 1 tablet by mouth daily.    . Dulaglutide (TRULICITY) 1.5 NA/3.5TD SOPN Inject 1.5 mg into the skin every 7 (seven) days. 4 pen 12  . esomeprazole (NEXIUM) 20 MG capsule Take 1 capsule (20 mg total) by mouth daily at 12 noon. 90 capsule 3  . Fluticasone-Umeclidin-Vilant (TRELEGY ELLIPTA) 100-62.5-25 MCG/INH AEPB Inhale 1 puff into the lungs daily. 30 each 5  . gabapentin (NEURONTIN) 300 MG capsule  Take 1 capsule (300 mg total) by mouth 2 (two) times daily.  For restless legs 180 capsule 1  . linaclotide (LINZESS) 145 MCG CAPS capsule Take 1 capsule (145 mcg total) by mouth daily before breakfast. 30 capsule 2  . lithium carbonate 300 MG capsule Take 1 capsule (300 mg total) by mouth 2 (two) times daily with a meal. 180 capsule 0  . metFORMIN (GLUCOPHAGE) 500 MG tablet Take 500 mg by mouth 2 (two) times daily.    . Multiple Vitamins-Minerals (MULTIVITAMIN ADULT EXTRA C) CHEW Chew by mouth.    . nicotine (NICODERM CQ - DOSED IN MG/24 HOURS) 21 mg/24hr patch One '21mg'$  patch chest wall daily (may substitute generic) 28 patch 0  . norethindrone-ethinyl estradiol (JUNEL FE 1/20) 1-20 MG-MCG tablet Take 1 tablet by mouth daily. 84 tablet 0  . OLANZapine (ZYPREXA) 15 MG tablet Take 1 tablet (15 mg total) by mouth at bedtime. 90 tablet 0  . omega-3 acid ethyl esters (LOVAZA) 1 g capsule Take 1 capsule by mouth twice daily 180 capsule 1  . ondansetron (ZOFRAN-ODT) 4 MG disintegrating tablet Take 1 tablet (4 mg total) by mouth every 6 (six) hours as needed for nausea. 20 tablet 0  . predniSONE (DELTASONE) 10 MG tablet Two tabs po day1,2; 1 tab po day3,4; 1/2 tab po day5,6 7 tablet 0  . primidone (MYSOLINE) 50 MG tablet Take 1 tablet (50 mg total) by mouth at bedtime. 90 tablet 1  . sertraline (ZOLOFT) 100 MG tablet Take 1 tablet (100 mg total) by mouth daily. 90 tablet 0  . traZODone (DESYREL) 100 MG tablet Take 1.5 tablets (150 mg total) by mouth at bedtime. For sleep 135 tablet 0   No current facility-administered medications for this visit.     Musculoskeletal: Strength & Muscle Tone: UTA Gait & Station: normal Patient leans: N/A  Psychiatric Specialty Exam: Review of Systems  Psychiatric/Behavioral: The patient is nervous/anxious.   All other systems reviewed and are negative.   Last menstrual period 11/26/2018.There is no height or weight on file to calculate BMI.  General Appearance:  Casual  Eye Contact:  Fair  Speech:  Clear and Coherent  Volume:  Normal  Mood:  Anxious  Affect:  Congruent  Thought Process:  Goal Directed and Descriptions of Associations: Intact  Orientation:  Full (Time, Place, and Person)  Thought Content: Logical   Suicidal Thoughts:  No  Homicidal Thoughts:  No  Memory:  Immediate;   Fair Recent;   Fair Remote;   Fair  Judgement:  Fair  Insight:  Fair  Psychomotor Activity:  Normal  Concentration:  Concentration: Fair and Attention Span: Fair  Recall:  [employer] of Knowledge: Fair  Language: Fair  Akathisia:  No  Handed:  Right  AIMS (if indicated): UTA  Assets:  Communication Skills Desire for Improvement Housing Social Support  ADL's:  Intact  Cognition: WNL  Sleep:  Fair   Screenings: AIMS     Office Visit from 04/07/2018 in [place] [facility] Office Visit from 01/01/2018 in [facility] Total Score  10  1    GAD-7     Office Visit from 12/16/2018 in [facility] Office Visit from 09/06/2017 in [facility]  Total GAD-7 Score  8  8    PHQ2-9     Office Visit from 01/09/2019 in [facility] Office Visit from 12/30/2018 in [facility] Office Visit from 12/16/2018 in [facility] Office Visit from 07/25/2018 in [facility] Office Visit from 06/09/2018 in [place]  PHQ-2 Total Score  0  0  3  2  0  PHQ-9 Total Score  0  '2  13  8  '$ --       Assessment and Plan: Ohanna is a 44 year old Caucasian female who has a history of bipolar disorder, PTSD, panic disorder, drug induced parkinsonism, diabetes melitis, polysubstance abuse was evaluated by telemedicine today.  Patient is biologically predisposed given her history of trauma as well as multiple medical problems, mental health problems in her family and past history of substance abuse problems.  Patient with psychosocial  stressors of recent surgery, her husband's health issues is currently coping okay.  Plan as noted below.  Plan Bipolar disorder in remission Lithium 300 mg p.o. twice daily with meals Zoloft 100 mg p.o. daily Zyprexa 15 mg p.o. nightly  Panic attacks-stable Zoloft 100 mg p.o. daily  PTSD-stable Zoloft 100 mg p.o. daily  Insomnia-stable Trazodone 100 mg p.o. nightly  Drug-induced Parkinson's disease-stable Cogentin 1 mg p.o. 3 times daily She will continue to follow-up with neurology for her tremors  Alcohol use disorder/cocaine and cannabis use disorder in remission She will continue to stay sober  Tobacco use disorder-in remission Patient reports she quit smoking on 04/26/2019  High risk medication use-pending labs-lithium level, BMP, lipid panel, hemoglobin A1c, prolactin, CBC with differential.  Patient agrees to get it done.  Reviewed medical records in [facility] from her most recent hospital admission-dated 04/26/2019-per Dr. Cordelia Poche  Follow-up in clinic in 4 weeks or sooner if needed.  I have spent atleast 20 minutes non face to face with patient today. More than 50 % of the time was spent for preparing to see the patient ( e.g., review of test, records ),  ordering medications and test ,psychoeducation and supportive psychotherapy and care coordination,as well as documenting clinical information in electronic health record. This note was generated in part or whole with voice recognition software. Voice recognition is usually quite accurate but there are transcription errors that can and very often do occur. I apologize for any typographical errors that were not detected and corrected.      Ursula Alert, MD 06/04/2019, 6:05 PM

## 2019-06-05 DIAGNOSIS — J9601 Acute respiratory failure with hypoxia: Secondary | ICD-10-CM | POA: Diagnosis not present

## 2019-06-05 DIAGNOSIS — J4541 Moderate persistent asthma with (acute) exacerbation: Secondary | ICD-10-CM | POA: Diagnosis not present

## 2019-06-05 DIAGNOSIS — C3400 Malignant neoplasm of unspecified main bronchus: Secondary | ICD-10-CM | POA: Diagnosis not present

## 2019-06-18 ENCOUNTER — Ambulatory Visit (INDEPENDENT_AMBULATORY_CARE_PROVIDER_SITE_OTHER): Payer: Medicare HMO | Admitting: Licensed Clinical Social Worker

## 2019-06-18 DIAGNOSIS — M25552 Pain in left hip: Secondary | ICD-10-CM

## 2019-06-18 DIAGNOSIS — E1169 Type 2 diabetes mellitus with other specified complication: Secondary | ICD-10-CM | POA: Diagnosis not present

## 2019-06-18 DIAGNOSIS — M160 Bilateral primary osteoarthritis of hip: Secondary | ICD-10-CM | POA: Diagnosis not present

## 2019-06-18 DIAGNOSIS — G40909 Epilepsy, unspecified, not intractable, without status epilepticus: Secondary | ICD-10-CM

## 2019-06-18 DIAGNOSIS — G8929 Other chronic pain: Secondary | ICD-10-CM

## 2019-06-18 DIAGNOSIS — F329 Major depressive disorder, single episode, unspecified: Secondary | ICD-10-CM

## 2019-06-18 DIAGNOSIS — E1165 Type 2 diabetes mellitus with hyperglycemia: Secondary | ICD-10-CM | POA: Diagnosis not present

## 2019-06-18 DIAGNOSIS — R69 Illness, unspecified: Secondary | ICD-10-CM | POA: Diagnosis not present

## 2019-06-18 NOTE — Chronic Care Management (AMB) (Signed)
Chronic Care Management    Clinical Social Work Follow Up Note  06/18/2019 Name: Vanessa Baker MRN: [id number] DOB: [birth year]  Vanessa Baker is a 44 y.o. year old female who is a primary care patient of Verl Bangs, FNP. The CCM team was consulted for assistance with [employer] .   Review of patient status, including review of consultants reports, other relevant assessments, and collaboration with appropriate care team members and the patient's provider was performed as part of comprehensive patient evaluation and provision of chronic care management services.    SDOH (Social Determinants of Health) assessments performed: Yes    Outpatient Encounter Medications as of 06/18/2019  Medication Sig  . ACCU-CHEK AVIVA PLUS test strip USE 1 STRIP TO CHECK GLUCOSE UP TO 3 TIMES DAILY AS DIRECTED.  . Accu-Chek Softclix Lancets lancets Use to check blood sugar up to 3 times daily as instructed  . acetaminophen (TYLENOL) 500 MG tablet Take 500 mg by mouth every 6 (six) hours as needed.  Marland Kitchen albuterol (PROAIR HFA) 108 (90 Base) MCG/ACT inhaler Inhale 2 puffs into the lungs every 4 (four) hours as needed for wheezing or shortness of breath.  Marland Kitchen atorvastatin (LIPITOR) 40 MG tablet Take 1 tablet (40 mg total) by mouth daily.  . baclofen (LIORESAL) 10 MG tablet Take 1 tablet (10 mg total) by mouth 3 (three) times daily as needed for muscle spasms.  . benztropine (COGENTIN) 1 MG tablet Take 1 tablet (1 mg total) by mouth 3 (three) times daily.  . blood glucose meter kit and supplies Dispense based on patient and insurance preference. Use up to four times daily as directed. (FOR ICD-10 E10.9, E11.9).  Marland Kitchen Blood Glucose Monitoring Suppl (ACCU-CHEK AVIVA PLUS) w/Device KIT Use to check blood sugar up to 3 times daily  . Calcium Carb-Cholecalciferol (CALCIUM 600+D3 PO) Take 1 tablet by mouth daily.  . Dulaglutide (TRULICITY) 1.5 HW/2.9HB SOPN Inject 1.5 mg into the skin every 7 (seven)  days.  Marland Kitchen esomeprazole (NEXIUM) 20 MG capsule Take 1 capsule (20 mg total) by mouth daily at 12 noon.  . Fluticasone-Umeclidin-Vilant (TRELEGY ELLIPTA) 100-62.5-25 MCG/INH AEPB Inhale 1 puff into the lungs daily.  Marland Kitchen gabapentin (NEURONTIN) 300 MG capsule Take 1 capsule (300 mg total) by mouth 2 (two) times daily. For restless legs  . linaclotide (LINZESS) 145 MCG CAPS capsule Take 1 capsule (145 mcg total) by mouth daily before breakfast.  . lithium carbonate 300 MG capsule Take 1 capsule (300 mg total) by mouth 2 (two) times daily with a meal.  . metFORMIN (GLUCOPHAGE) 500 MG tablet Take 500 mg by mouth 2 (two) times daily.  . Multiple Vitamins-Minerals (MULTIVITAMIN ADULT EXTRA C) CHEW Chew by mouth.  . nicotine (NICODERM CQ - DOSED IN MG/24 HOURS) 21 mg/24hr patch One '21mg'$  patch chest wall daily (may substitute generic)  . norethindrone-ethinyl estradiol (JUNEL FE 1/20) 1-20 MG-MCG tablet Take 1 tablet by mouth daily.  Marland Kitchen OLANZapine (ZYPREXA) 15 MG tablet Take 1 tablet (15 mg total) by mouth at bedtime.  Marland Kitchen omega-3 acid ethyl esters (LOVAZA) 1 g capsule Take 1 capsule by mouth twice daily  . ondansetron (ZOFRAN-ODT) 4 MG disintegrating tablet Take 1 tablet (4 mg total) by mouth every 6 (six) hours as needed for nausea.  . predniSONE (DELTASONE) 10 MG tablet Two tabs po day1,2; 1 tab po day3,4; 1/2 tab po day5,6  . primidone (MYSOLINE) 50 MG tablet Take 1 tablet (50 mg total) by mouth at bedtime.  . sertraline (  ZOLOFT) 100 MG tablet Take 1 tablet (100 mg total) by mouth daily.  . traZODone (DESYREL) 100 MG tablet Take 1.5 tablets (150 mg total) by mouth at bedtime. For sleep   No facility-administered encounter medications on file as of 06/18/2019.     Goals Addressed    . "I need financial assistance." (pt-stated)       Current Barriers:  . Financial constraints related to affording rent, utilities, health care, etc,  . ADL IADL limitations . Social Isolation  Clinical Social Work Clinical  Goal(s):  Marland Kitchen Over the next 120 days, patient will work with SW to address concerns related to gaining additional financial support/resource education and connection . Over the next 120 days, patient will work with SW to address concerns related to gaining additional support/resource connection in order to maintain health . Over the next 120 days, patient will demonstrate improved adherence to self care as evidenced by implementing healthy self-care into her daily routine such as: attending all medical appointments, deep breathing exercsies, taking time for self-reflection, taking medications as prescribed, drinking water and daily exercise to improve mobility.   Interventions: . Patient interviewed and appropriate assessments performed . Provided patient with information about crisis support, housing support and financial support resources within the area. Patient's spouse is actively involved with [facility]. . LCSW discussed coping skills for stress and anxiety. SW used empathetic and active and reflective listening, validated patient's feelings/concerns, and provided emotional support. LCSW provided self-care education to help manage her multiple health conditions and improve her mood.  . Discussed plans with patient for ongoing care management follow up and provided patient with direct contact information for care management team . Assisted patient/caregiver with obtaining information about health plan benefits . LCSW spoke with patient and husband. They do not qualify for Medicaid due to income but continue to struggle to make ends meet. Spouse no longer working due to injuries from a recent car accident. Family receive $29.00 in food stamps and go to local food pantries. Family is again 2 months late in rent. [facility] has already assisted patient in the past with rent and another time with food and encouraged family to apply for the energy assistance program. LCSW provided patient with [facility]'s  contact information as well.  . Patient reports that she recently had gallbladder surgery and that her spouse  had carpel tunel surgery done on one of his hands with plans to do the other soon.   Patient Self Care Activities:  . Attends all scheduled provider appointments . Lacks social connections  Please see past updates related to this goal by clicking on the "Past Updates" button in the selected goal       Follow Up Plan: SW will follow up with patient by phone over the next quarter  Eula Fried, [place], MSW, [email or web address] Phone: [phone]

## 2019-06-23 ENCOUNTER — Other Ambulatory Visit: Payer: Self-pay | Admitting: Family Medicine

## 2019-06-23 ENCOUNTER — Other Ambulatory Visit: Payer: Self-pay | Admitting: Psychiatry

## 2019-06-23 DIAGNOSIS — F3130 Bipolar disorder, current episode depressed, mild or moderate severity, unspecified: Secondary | ICD-10-CM

## 2019-06-23 DIAGNOSIS — I10 Essential (primary) hypertension: Secondary | ICD-10-CM

## 2019-06-24 ENCOUNTER — Telehealth: Payer: Self-pay

## 2019-06-24 NOTE — Telephone Encounter (Signed)
Appt scheduled with the patient, no questions or concern.

## 2019-06-26 ENCOUNTER — Ambulatory Visit: Payer: Self-pay | Admitting: Family Medicine

## 2019-06-30 DIAGNOSIS — R251 Tremor, unspecified: Secondary | ICD-10-CM | POA: Diagnosis not present

## 2019-06-30 DIAGNOSIS — G2581 Restless legs syndrome: Secondary | ICD-10-CM | POA: Diagnosis not present

## 2019-07-05 DIAGNOSIS — J4541 Moderate persistent asthma with (acute) exacerbation: Secondary | ICD-10-CM | POA: Diagnosis not present

## 2019-07-05 DIAGNOSIS — C3400 Malignant neoplasm of unspecified main bronchus: Secondary | ICD-10-CM | POA: Diagnosis not present

## 2019-07-05 DIAGNOSIS — J9601 Acute respiratory failure with hypoxia: Secondary | ICD-10-CM | POA: Diagnosis not present

## 2019-07-08 ENCOUNTER — Other Ambulatory Visit
Admission: RE | Admit: 2019-07-08 | Discharge: 2019-07-08 | Disposition: A | Discharge status: Home / Self Care | Payer: Medicare HMO | Source: Ambulatory Visit | Attending: Psychiatry | Admitting: Psychiatry

## 2019-07-08 DIAGNOSIS — Z79899 Other long term (current) drug therapy: Secondary | ICD-10-CM | POA: Diagnosis not present

## 2019-07-08 LAB — CBC WITH DIFFERENTIAL/PLATELET
Abs Immature Granulocytes: 0.04 10*3/uL (ref 0.00–0.07)
Basophils Absolute: 0.2 10*3/uL — ABNORMAL HIGH (ref 0.0–0.1)
Basophils Relative: 2 %
Eosinophils Absolute: 0.6 10*3/uL — ABNORMAL HIGH (ref 0.0–0.5)
Eosinophils Relative: 6 %
HCT: 41.1 % (ref 36.0–46.0)
Hemoglobin: 13.7 g/dL (ref 12.0–15.0)
Immature Granulocytes: 0 %
Lymphocytes Relative: 28 %
Lymphs Abs: 2.9 10*3/uL (ref 0.7–4.0)
MCH: 29.2 pg (ref 26.0–34.0)
MCHC: 33.3 g/dL (ref 30.0–36.0)
MCV: 87.6 fL (ref 80.0–100.0)
Monocytes Absolute: 0.4 10*3/uL (ref 0.1–1.0)
Monocytes Relative: 4 %
Neutro Abs: 6 10*3/uL (ref 1.7–7.7)
Neutrophils Relative %: 60 %
Platelets: 292 10*3/uL (ref 150–400)
RBC: 4.69 MIL/uL (ref 3.87–5.11)
RDW: 12.7 % (ref 11.5–15.5)
WBC: 10.1 10*3/uL (ref 4.0–10.5)
nRBC: 0 % (ref 0.0–0.2)

## 2019-07-08 LAB — LIPID PANEL
Cholesterol: 106 mg/dL (ref 0–200)
HDL: 33 mg/dL — ABNORMAL LOW (ref >40)
LDL Cholesterol: 10 mg/dL (ref 0–99)
Total CHOL/HDL Ratio: 3.2 RATIO
Triglycerides: 314 mg/dL — ABNORMAL HIGH (ref <150)
VLDL: 63 mg/dL — ABNORMAL HIGH (ref 0–40)

## 2019-07-08 LAB — HEMOGLOBIN A1C
Hgb A1c MFr Bld: 5.5 % (ref 4.8–5.6)
Mean Plasma Glucose: 111.15 mg/dL

## 2019-07-09 ENCOUNTER — Institutional Professional Consult (permissible substitution): Payer: Medicare HMO | Admitting: Pulmonary Disease

## 2019-07-09 LAB — PROLACTIN: Prolactin: 8.9 ng/mL (ref 4.8–23.3)

## 2019-07-10 ENCOUNTER — Telehealth: Payer: Self-pay | Admitting: Psychiatry

## 2019-07-10 NOTE — Telephone Encounter (Signed)
Called  patient.  Discussed her abnormal labs-lipid panel-abnormal Hemoglobin A1c-6.1-chronic.  Discussed with patient to reach out to her primary care provider to discuss further management.

## 2019-07-22 ENCOUNTER — Other Ambulatory Visit: Payer: Self-pay | Admitting: Family Medicine

## 2019-07-23 MED ORDER — LISINOPRIL 10 MG PO TABS: 10.0000 mg | ORAL_TABLET | Freq: Every day | ORAL | 0 refills | Status: DC

## 2019-07-28 ENCOUNTER — Ambulatory Visit: Payer: Medicare HMO | Admitting: Family Medicine

## 2019-07-30 ENCOUNTER — Ambulatory Visit: Payer: Medicare HMO | Admitting: Family Medicine

## 2019-08-05 DIAGNOSIS — J9601 Acute respiratory failure with hypoxia: Secondary | ICD-10-CM | POA: Diagnosis not present

## 2019-08-05 DIAGNOSIS — J4541 Moderate persistent asthma with (acute) exacerbation: Secondary | ICD-10-CM | POA: Diagnosis not present

## 2019-08-05 DIAGNOSIS — C3400 Malignant neoplasm of unspecified main bronchus: Secondary | ICD-10-CM | POA: Diagnosis not present

## 2019-08-21 ENCOUNTER — Encounter: Payer: Self-pay | Admitting: Nurse Practitioner

## 2019-08-25 ENCOUNTER — Other Ambulatory Visit: Payer: Self-pay

## 2019-08-25 ENCOUNTER — Ambulatory Visit (INDEPENDENT_AMBULATORY_CARE_PROVIDER_SITE_OTHER): Payer: Medicare HMO | Admitting: Nurse Practitioner

## 2019-08-25 ENCOUNTER — Encounter: Payer: Self-pay | Admitting: Nurse Practitioner

## 2019-08-25 VITALS — BP 115/70 | HR 87 | Temp 98.7°F | Ht 62.2 in | Wt 171.8 lb

## 2019-08-25 DIAGNOSIS — E1159 Type 2 diabetes mellitus with other circulatory complications: Secondary | ICD-10-CM

## 2019-08-25 DIAGNOSIS — R69 Illness, unspecified: Secondary | ICD-10-CM | POA: Diagnosis not present

## 2019-08-25 DIAGNOSIS — R6882 Decreased libido: Secondary | ICD-10-CM

## 2019-08-25 DIAGNOSIS — F3175 Bipolar disorder, in partial remission, most recent episode depressed: Secondary | ICD-10-CM

## 2019-08-25 DIAGNOSIS — I10 Essential (primary) hypertension: Secondary | ICD-10-CM | POA: Diagnosis not present

## 2019-08-25 DIAGNOSIS — E785 Hyperlipidemia, unspecified: Secondary | ICD-10-CM | POA: Diagnosis not present

## 2019-08-25 DIAGNOSIS — T43505A Adverse effect of unspecified antipsychotics and neuroleptics, initial encounter: Secondary | ICD-10-CM

## 2019-08-25 DIAGNOSIS — F17219 Nicotine dependence, cigarettes, with unspecified nicotine-induced disorders: Secondary | ICD-10-CM

## 2019-08-25 DIAGNOSIS — E6609 Other obesity due to excess calories: Secondary | ICD-10-CM

## 2019-08-25 DIAGNOSIS — Z598 Other problems related to housing and economic circumstances: Secondary | ICD-10-CM | POA: Insufficient documentation

## 2019-08-25 DIAGNOSIS — Z599 Problem related to housing and economic circumstances, unspecified: Secondary | ICD-10-CM

## 2019-08-25 DIAGNOSIS — E1169 Type 2 diabetes mellitus with other specified complication: Secondary | ICD-10-CM | POA: Diagnosis not present

## 2019-08-25 DIAGNOSIS — M79671 Pain in right foot: Secondary | ICD-10-CM

## 2019-08-25 DIAGNOSIS — G2581 Restless legs syndrome: Secondary | ICD-10-CM | POA: Diagnosis not present

## 2019-08-25 DIAGNOSIS — G2111 Neuroleptic induced parkinsonism: Secondary | ICD-10-CM

## 2019-08-25 DIAGNOSIS — E119 Type 2 diabetes mellitus without complications: Secondary | ICD-10-CM

## 2019-08-25 DIAGNOSIS — J4540 Moderate persistent asthma, uncomplicated: Secondary | ICD-10-CM | POA: Diagnosis not present

## 2019-08-25 DIAGNOSIS — F4310 Post-traumatic stress disorder, unspecified: Secondary | ICD-10-CM

## 2019-08-25 DIAGNOSIS — I152 Hypertension secondary to endocrine disorders: Secondary | ICD-10-CM

## 2019-08-25 DIAGNOSIS — E66811 Obesity, class 1: Secondary | ICD-10-CM

## 2019-08-25 DIAGNOSIS — E669 Obesity, unspecified: Secondary | ICD-10-CM | POA: Insufficient documentation

## 2019-08-25 DIAGNOSIS — Z7689 Persons encountering health services in other specified circumstances: Secondary | ICD-10-CM

## 2019-08-25 DIAGNOSIS — Z6831 Body mass index (BMI) 31.0-31.9, adult: Secondary | ICD-10-CM

## 2019-08-25 LAB — MICROALBUMIN, URINE WAIVED
Creatinine, Urine Waived: 200 mg/dL (ref 10–300)
Microalb, Ur Waived: 30 mg/L — ABNORMAL HIGH (ref 0–19)
Microalb/Creat Ratio: <30 mg/g (ref <30)

## 2019-08-25 MED ORDER — GABAPENTIN 300 MG PO CAPS: 300.0000 mg | ORAL_CAPSULE | Freq: Two times a day (BID) | ORAL | 1 refills | Status: DC

## 2019-08-25 NOTE — Assessment & Plan Note (Addendum)
Chronic, ongoing.  Last A1C in March below goal.  Continue Metformin and Trulicity, adjust doses as needed.  A1C today + urine ALB.  Recommend she monitor BS at home every morning, fasting, and document for provider + bring to visits.  Focus on diabetic diet.  CCM referral placed by referral for eye exam.  Return to office in 6 weeks.

## 2019-08-25 NOTE — Assessment & Plan Note (Signed)
Chronic, ongoing.  BP below goal today.  Continue Lisinopril daily, kidney protection in diabetes.  Urine ALB obtained today and CMP + TSH.  Recommend she monitor BP at home daily and document + bring to visits.  Focus on DASH diet.  CCM referral placed.  Return to office in 6 weeks.

## 2019-08-25 NOTE — Assessment & Plan Note (Signed)
Ongoing for over a month, suspect plantar fascitis.  Sees ortho tomorrow.  Recommend she further discuss with them.  At this time time recommend ice to area and rest.

## 2019-08-25 NOTE — Assessment & Plan Note (Signed)
Chronic, stable.  Continue collaboration with neurology and current medication regimen as prescribed by them.
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
## 2019-08-25 NOTE — Addendum Note (Signed)
Addended by: Marnee Guarneri T on: 08/25/2019 12:58 PM   Modules accepted: Orders
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
## 2019-08-25 NOTE — Assessment & Plan Note (Signed)
Recommend continued cessation of smoking, quit in March 2021.  Will continue to monitor closely for return to smoking.

## 2019-08-25 NOTE — Assessment & Plan Note (Signed)
Chronic, ongoing.  Recommend continued cessation of smoking.  Monitor closely for return to tobacco use.  Continue Trelegy and adjust dose as needed.  CBC today.

## 2019-08-25 NOTE — Assessment & Plan Note (Signed)
Chronic, ongoing.  Continue Gabapentin as ordered, refills sent in, plus collaboration with neurology.

## 2019-08-25 NOTE — Assessment & Plan Note (Signed)
Chronic, ongoing.  Continue Atorvastatin 40 MG daily and adjust as needed.  Lipid panel and CMP today.  Return in 6 weeks.

## 2019-08-25 NOTE — Assessment & Plan Note (Signed)
Recommended eating smaller high protein, low fat meals more frequently and exercising 30 mins a day 5 times a week with a goal of 10-15lb weight loss in the next 3 months. Patient voiced their understanding and motivation to adhere to these recommendations.  

## 2019-08-25 NOTE — Patient Instructions (Signed)
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
## 2019-08-25 NOTE — Assessment & Plan Note (Signed)
Chronic, ongoing.  Denies SI/HI.  Continue collaboration with psychiatry and current medication regimen as prescribed by them.  Would benefit from therapy, but refuses at this time.  Highly recommended this.  Recommend she follow-up with psychiatry as soon as possible to further discuss her libido and see if adjustment in psych medication regimen may benefit this.  Will obtain Lithium level today for psychiatry provider as has not had one recently.

## 2019-08-25 NOTE — Assessment & Plan Note (Signed)
Referrals to CCM and Connected Care placed.

## 2019-08-26 ENCOUNTER — Telehealth: Payer: Self-pay | Admitting: Nurse Practitioner

## 2019-08-26 LAB — CBC WITH DIFFERENTIAL/PLATELET
Basophils Absolute: 0.2 10*3/uL (ref 0.0–0.2)
Basos: 2 %
EOS (ABSOLUTE): 0.2 10*3/uL (ref 0.0–0.4)
Eos: 2 %
Hematocrit: 46 % (ref 34.0–46.6)
Hemoglobin: 14.7 g/dL (ref 11.1–15.9)
Immature Grans (Abs): 0 10*3/uL (ref 0.0–0.1)
Immature Granulocytes: 0 %
Lymphocytes Absolute: 2.9 10*3/uL (ref 0.7–3.1)
Lymphs: 30 %
MCH: 28.2 pg (ref 26.6–33.0)
MCHC: 32 g/dL (ref 31.5–35.7)
MCV: 88 fL (ref 79–97)
Monocytes Absolute: 0.5 10*3/uL (ref 0.1–0.9)
Monocytes: 5 %
Neutrophils Absolute: 6 10*3/uL (ref 1.4–7.0)
Neutrophils: 61 %
Platelets: 305 10*3/uL (ref 150–450)
RBC: 5.22 x10E6/uL (ref 3.77–5.28)
RDW: 11.7 % (ref 11.7–15.4)
WBC: 9.8 10*3/uL (ref 3.4–10.8)

## 2019-08-26 LAB — COMPREHENSIVE METABOLIC PANEL
ALT: 20 IU/L (ref 0–32)
AST: 26 IU/L (ref 0–40)
Albumin/Globulin Ratio: 1.8 (ref 1.2–2.2)
Albumin: 4.4 g/dL (ref 3.8–4.8)
Alkaline Phosphatase: 95 IU/L (ref 48–121)
BUN/Creatinine Ratio: 11 (ref 9–23)
BUN: 6 mg/dL (ref 6–24)
Bilirubin Total: <0.2 mg/dL (ref 0.0–1.2)
CO2: 24 mmol/L (ref 20–29)
Calcium: 9.5 mg/dL (ref 8.7–10.2)
Chloride: 98 mmol/L (ref 96–106)
Creatinine, Ser: 0.56 mg/dL — ABNORMAL LOW (ref 0.57–1.00)
GFR calc Af Amer: 130 mL/min/{1.73_m2} (ref >59)
GFR calc non Af Amer: 113 mL/min/{1.73_m2} (ref >59)
Globulin, Total: 2.5 g/dL (ref 1.5–4.5)
Glucose: 65 mg/dL (ref 65–99)
Potassium: 4.4 mmol/L (ref 3.5–5.2)
Sodium: 135 mmol/L (ref 134–144)
Total Protein: 6.9 g/dL (ref 6.0–8.5)

## 2019-08-26 LAB — TSH: TSH: 1.38 u[IU]/mL (ref 0.450–4.500)

## 2019-08-26 LAB — FSH/LH
FSH: 4.8 m[IU]/mL
LH: 4.3 m[IU]/mL

## 2019-08-26 LAB — LIPID PANEL W/O CHOL/HDL RATIO
Cholesterol, Total: 105 mg/dL (ref 100–199)
HDL: 29 mg/dL — ABNORMAL LOW (ref >39)
LDL Chol Calc (NIH): 46 mg/dL (ref 0–99)
Triglycerides: 183 mg/dL — ABNORMAL HIGH (ref 0–149)
VLDL Cholesterol Cal: 30 mg/dL (ref 5–40)

## 2019-08-26 LAB — LITHIUM LEVEL: Lithium Lvl: <0.1 mmol/L — ABNORMAL LOW (ref 0.5–1.2)

## 2019-08-26 LAB — HEMOGLOBIN A1C
Est. average glucose Bld gHb Est-mCnc: 114 mg/dL
Hgb A1c MFr Bld: 5.6 % (ref 4.8–5.6)

## 2019-08-26 NOTE — Chronic Care Management (AMB) (Signed)
  Chronic Care Management   Note  08/26/2019 Name: Vanessa Baker MRN: [id number] DOB: [birth year]  Vanessa Baker is a 44 y.o. year old female who is a primary care patient of Cannady, Barbaraann Faster, NP. I reached out to [employer] by phone today in response to a referral sent by Vanessa Baker's PCP, Marnee Guarneri, NP     Vanessa Baker was given information about Chronic Care Management services today including:  1. CCM service includes personalized support from designated clinical staff supervised by her physician, including individualized plan of care and coordination with other care providers 2. 24/7 contact phone numbers for assistance for urgent and routine care Baker. 3. Service will only be billed when office clinical staff spend 20 minutes or more in a month to coordinate care. 4. Only one practitioner may furnish and bill the service in a calendar month. 5. The patient may stop CCM services at any time (effective at the end of the month) by phone call to the office staff. 6. The patient will be responsible for cost sharing (co-pay) of up to 20% of the service fee (after annual deductible is met).  Patient agreed to services and verbal consent obtained.   Follow up plan: Telephone appointment with care management team member scheduled for: Pharm D 09/04/2019 RN CM 10/02/2019   Noreene Larsson, [address], [place], [place] [address] Direct Dial: [phone] [email or web address] Website: [email or web address]

## 2019-08-26 NOTE — Progress Notes (Signed)
Contacted via MyChart

## 2019-08-27 ENCOUNTER — Telehealth: Payer: Self-pay

## 2019-09-01 NOTE — Chronic Care Management (AMB) (Deleted)
Chronic Care Management Pharmacy  Name: Vanessa Baker  MRN: [id number] DOB: 1976-01-16   Chief Complaint/ HPI  Vanessa Baker,  44 y.o. , female presents for their Initial CCM visit with the clinical pharmacist via telephone due to COVID-19 Pandemic.  PCP : Venita Lick, NP Patient Care Team: Venita Lick, NP as PCP - General (Nurse Practitioner) Greg Cutter, LCSW as Social Worker (Licensed Clinical Social Worker) Melrose Nakayama, Doneta Public, MD as Consulting Physician (Neurology) Ursula Alert, MD as Consulting Physician (Psychiatry)  Their chronic conditions include: Hypertension, Hyperlipidemia, Diabetes, Asthma, Tobacco use and Bipolar disorder   Office Visits: 08/25/19 - Marnee Guarneri, NP- initial visit, blood work, lithium lvl,  Consult Visit: 08/26/19-Dr. Melrose Nakayama, Neurology - Not available 06/04/19-Dr. Shea Evans, Psychology-  04/26/19- [facility] admission - Cholecystitis, lap chole, post-op ARDS   Allergies  Allergen Reactions  . Amitriptyline Other (See Comments)    Confusion, paralysis    Medications: Outpatient Encounter Medications as of 09/04/2019  Medication Sig  . ACCU-CHEK AVIVA PLUS test strip USE 1 STRIP TO CHECK GLUCOSE UP TO 3 TIMES DAILY AS DIRECTED.  . Accu-Chek Softclix Lancets lancets Use to check blood sugar up to 3 times daily as instructed  . acetaminophen (TYLENOL) 500 MG tablet Take 500 mg by mouth every 6 (six) hours as needed.  Marland Kitchen albuterol (PROAIR HFA) 108 (90 Base) MCG/ACT inhaler Inhale 2 puffs into the lungs every 4 (four) hours as needed for wheezing or shortness of breath.  Marland Kitchen atorvastatin (LIPITOR) 40 MG tablet Take 1 tablet (40 mg total) by mouth daily.  . baclofen (LIORESAL) 10 MG tablet Take 1 tablet (10 mg total) by mouth 3 (three) times daily as needed for muscle spasms.  . benztropine (COGENTIN) 1 MG tablet Take 1 tablet (1 mg total) by mouth 3 (three) times daily.  . blood glucose meter kit and supplies Dispense based  on patient and insurance preference. Use up to four times daily as directed. (FOR ICD-10 E10.9, E11.9).  Marland Kitchen Blood Glucose Monitoring Suppl (ACCU-CHEK AVIVA PLUS) w/Device KIT Use to check blood sugar up to 3 times daily  . Calcium Carb-Cholecalciferol (CALCIUM 600+D3 PO) Take 1 tablet by mouth daily.  . Dulaglutide (TRULICITY) 1.5 CB/7.6EG SOPN Inject 1.5 mg into the skin every 7 (seven) days.  Marland Kitchen esomeprazole (NEXIUM) 20 MG capsule Take 1 capsule (20 mg total) by mouth daily at 12 noon.  . Fluticasone-Umeclidin-Vilant (TRELEGY ELLIPTA) 100-62.5-25 MCG/INH AEPB Inhale 1 puff into the lungs daily.  Marland Kitchen gabapentin (NEURONTIN) 300 MG capsule Take 1 capsule (300 mg total) by mouth 2 (two) times daily. For restless legs  . linaclotide (LINZESS) 145 MCG CAPS capsule Take 1 capsule (145 mcg total) by mouth daily before breakfast.  . lisinopril (ZESTRIL) 10 MG tablet Take 1 tablet (10 mg total) by mouth daily.  Marland Kitchen lithium carbonate 300 MG capsule Take 1 capsule (300 mg total) by mouth 2 (two) times daily with a meal.  . metFORMIN (GLUCOPHAGE) 500 MG tablet Take 500 mg by mouth 2 (two) times daily.  . Multiple Vitamins-Minerals (MULTIVITAMIN ADULT EXTRA C) CHEW Chew by mouth.  . norethindrone-ethinyl estradiol (JUNEL FE 1/20) 1-20 MG-MCG tablet Take 1 tablet by mouth daily.  Marland Kitchen OLANZapine (ZYPREXA) 15 MG tablet Take 1 tablet (15 mg total) by mouth at bedtime.  Marland Kitchen omega-3 acid ethyl esters (LOVAZA) 1 g capsule Take 1 capsule by mouth twice daily  . ondansetron (ZOFRAN-ODT) 4 MG disintegrating tablet Take 1 tablet (4 mg total) by  mouth every 6 (six) hours as needed for nausea.  . primidone (MYSOLINE) 50 MG tablet Take 1 tablet (50 mg total) by mouth at bedtime.  . sertraline (ZOLOFT) 100 MG tablet Take 1 tablet (100 mg total) by mouth daily.  . traZODone (DESYREL) 100 MG tablet TAKE 1 & 1/2 (ONE & ONE-HALF) TABLETS BY MOUTH AT BEDTIME FOR SLEEP   No facility-administered encounter medications on file as of  09/04/2019.     Goals Addressed   None     Diabetes   A1c goal <7%  Recent Relevant Labs: Lab Results  Component Value Date/Time   HGBA1C 5.6 08/25/2019 01:50 PM   HGBA1C 5.5 07/08/2019 10:17 AM   HGBA1C 5.4 12/09/2014 12:00 AM   MICROALBUR 30 (H) 08/25/2019 12:02 PM    Last diabetic Eye exam: No results found for: HMDIABEYEEXA  Last diabetic Foot exam: No results found for: HMDIABFOOTEX   Checking BG: {CHL HP Blood Glucose Monitoring [phone]}  Recent FBG Readings: *** Recent pre-meal BG readings: *** Recent 2hr PP BG readings:  *** Recent HS BG readings: ***  Patient has failed these meds in past: levemir, lispro Patient is currently {CHL Controlled/[phone]} on the following medications: . Trulicity 1.5 mg q 7 days LF 03/10/19 (#6--usnure if this is 6 pens or 6 months) . Metformin 500 mg bid   We discussed: {CHL HP Upstream Pharmacy [phone]}  Plan  Continue {CHL HP Upstream Pharmacy Plans:[phone]}  Hypertension   BP goal is:  <130/80  Office blood pressures are  BP Readings from Last 3 Encounters:  08/25/19 115/70  05/07/19 109/68  05/02/19 115/63   Patient checks BP at home {CHL HP BP Monitoring Frequency:[phone]} Patient home BP readings are ranging: ***  Patient has failed these meds in the past: *** Patient is currently {CHL Controlled/[phone]} on the following medications:  . Lisinopril 10 mg qd  We discussed {CHL HP Upstream Pharmacy [phone]}  Plan  Continue {CHL HP Upstream Pharmacy Plans:[phone]}     Hyperlipidemia   LDL goal < 70  Lipid Panel     Component Value Date/Time   CHOL 105 08/25/2019 1350   TRIG 183 (H) 08/25/2019 1350   HDL 29 (L) 08/25/2019 1350   LDLCALC 46 08/25/2019 1350   LDLCALC 54 12/19/2018 0822    Hepatic Function Latest Ref Rng & Units 08/25/2019 04/29/2019 04/28/2019  Total Protein 6.0 - 8.5 g/dL 6.9 6.4(L) 6.1(L)  Albumin 3.8 -  4.8 g/dL 4.4 2.3(L) 2.5(L)  AST 0 - 40 IU/L 26 37 40  ALT 0 - 32 IU/L '20 23 24  '$ Alk Phosphatase 48 - 121 IU/L 95 115 97  Total Bilirubin 0.0 - 1.2 mg/dL <0.2 0.5 0.3     The ASCVD Risk score Mikey Bussing DC Jr., et al., 2013) failed to calculate for the following reasons:   The valid total cholesterol range is 130 to 320 mg/dL   Patient has failed these meds in past: *** Patient is currently {CHL Controlled/[phone]} on the following medications:  . Atorvastatin 40 mg qd . Lovaza 1 gm bid?**  We discussed:  {CHL HP Upstream Pharmacy [phone]}  Plan  Continue {CHL HP Upstream Pharmacy Plans:[phone]}    Asthma / Tobacco   Last spirometry score: ***   Tobacco Status:  Social History   Tobacco Use  Smoking Status Former Smoker  . Packs/day: 0.50  . Years: 27.00  . Pack years: 13.50  . Types: Cigarettes  . Quit date: 04/26/2019  . Years since quitting: 0.3  Smokeless Tobacco Never Used    Patient has failed these meds in past: *** Patient is currently {CHL Controlled/Uncontrolled:6571119524} on the following medications:   Albuterol 2 puffs q4h prn  Trelegy Ellipta (fluticasoen-villanterol-umeclidinium) 100-62.5-25 1 puff qd - LF 03/15/19 x 90 day   Using maintenance inhaler regularly? {yes/no:20286} Frequency of rescue inhaler use:  {CHL HP Upstream Pharm Inhaler UJWJ:1914782956}  We discussed:  {CHL HP Upstream Pharmacy [phone]}  Plan  Continue {CHL HP Upstream Pharmacy [phone]}  and  Bipolar Depression/PTSD    Patient has failed these meds in past: Lurasidone Patient is currently {CHL Controlled/Uncontrolled:6571119524} on the following medications:   Lithium carbonate 300 mg bid  Olanzapine 15 mg qhs  Sertraline 100 mg qd  Trazodone 150 mg qhs  We discussed:  {CHL HP Upstream Pharmacy [phone]}  Plan  Continue {CHL HP Upstream Pharmacy [phone]}  Neuroleptic induced  Parkinsons Disease/RLS   Patient has failed these meds in past: ***Amantadine Patient is currently {CHL Controlled/Uncontrolled:6571119524} on the following medications:  . Gabapentin 300 mg bid . Baclofen 10 mg tid prn  . Benztropine 1 mg tid .   We discussed:  ***  Plan  Continue {CHL HP Upstream Pharmacy OZHYQ:6578469629}   MISC/OTC   Patient has failed these meds in past: *** Patient is currently {CHL Controlled/Uncontrolled:6571119524} on the following medications:  . Esomeprazole 20 mg qd . Norethindrone-ethinyl estradiol FE 1-20 mg/mcg  We discussed:  ***  Plan  Continue {CHL HP Upstream Pharmacy BMWUX:[id number]}   Vaccines   Reviewed and discussed patient's vaccination history.    Immunization History  Administered Date(s) Administered  . Influenza Inj Mdck Quad Pf 12/14/2015  . Influenza,inj,Quad PF,6+ Mos 02/28/2017, 11/19/2017, 11/07/2018  . Pneumococcal Polysaccharide-23 11/19/2017  . Tdap 03/08/2017    Plan  Recommended patient receive *** vaccine in *** office.    Medication Management   Pt uses [facility] for all medications Uses pill box? {Yes or If no, why not?:20788} Pt endorses ***% compliance  We discussed: ***  Plan  {US Pharmacy ZDGU:44034}    Follow up: *** month phone visit  ***

## 2019-09-04 ENCOUNTER — Telehealth: Payer: Medicare HMO

## 2019-09-04 ENCOUNTER — Telehealth: Payer: Self-pay | Admitting: Pharmacist

## 2019-09-04 DIAGNOSIS — C3400 Malignant neoplasm of unspecified main bronchus: Secondary | ICD-10-CM | POA: Diagnosis not present

## 2019-09-04 DIAGNOSIS — J4541 Moderate persistent asthma with (acute) exacerbation: Secondary | ICD-10-CM | POA: Diagnosis not present

## 2019-09-04 DIAGNOSIS — J9601 Acute respiratory failure with hypoxia: Secondary | ICD-10-CM | POA: Diagnosis not present

## 2019-09-04 NOTE — Progress Notes (Signed)
  Chronic Care Management   Outreach Note  09/04/2019 Name: Vanessa Baker MRN: [id number] DOB: 1975/09/01  Referred by: Venita Lick, NP Reason for referral : No chief complaint on file.   The patient was referred to the pharmacist for assistance with care management and care coordination. CPA reminder call made 09/03/19 in which patient requested appointment cancellation without reschedule at this time.  Follow Up Plan:  Will outreach patient in next 4-6 weeks for continued medication management concerns.  Junita Push. [place] Kingfisher PharmD, [place] [phone]     .

## 2019-09-25 ENCOUNTER — Ambulatory Visit (INDEPENDENT_AMBULATORY_CARE_PROVIDER_SITE_OTHER): Payer: Medicare HMO | Admitting: Licensed Clinical Social Worker

## 2019-09-25 DIAGNOSIS — E1169 Type 2 diabetes mellitus with other specified complication: Secondary | ICD-10-CM | POA: Diagnosis not present

## 2019-09-25 DIAGNOSIS — E669 Obesity, unspecified: Secondary | ICD-10-CM

## 2019-09-25 DIAGNOSIS — I152 Hypertension secondary to endocrine disorders: Secondary | ICD-10-CM

## 2019-09-25 DIAGNOSIS — E785 Hyperlipidemia, unspecified: Secondary | ICD-10-CM

## 2019-09-25 DIAGNOSIS — I10 Essential (primary) hypertension: Secondary | ICD-10-CM | POA: Diagnosis not present

## 2019-09-25 DIAGNOSIS — Z598 Other problems related to housing and economic circumstances: Secondary | ICD-10-CM

## 2019-09-25 DIAGNOSIS — E1159 Type 2 diabetes mellitus with other circulatory complications: Secondary | ICD-10-CM

## 2019-09-25 DIAGNOSIS — F3175 Bipolar disorder, in partial remission, most recent episode depressed: Secondary | ICD-10-CM | POA: Diagnosis not present

## 2019-09-25 DIAGNOSIS — Z599 Problem related to housing and economic circumstances, unspecified: Secondary | ICD-10-CM

## 2019-09-25 DIAGNOSIS — R69 Illness, unspecified: Secondary | ICD-10-CM | POA: Diagnosis not present

## 2019-09-25 NOTE — Chronic Care Management (AMB) (Signed)
Chronic Care Management    Clinical Social Work Follow Up Note  09/25/2019 Name: Vanessa Baker MRN: [id number] DOB: 06-16-1975  Vanessa Baker is a 44 y.o. year old female who is a primary care patient of Cannady, Barbaraann Faster, NP. The CCM team was consulted for assistance with [employer]  and [place] [employer].   Review of patient status, including review of consultants reports, other relevant assessments, and collaboration with appropriate care team members and the patient's provider was performed as part of comprehensive patient evaluation and provision of chronic care management services.    SDOH (Social Determinants of Health) assessments performed: Yes    Outpatient Encounter Medications as of 09/25/2019  Medication Sig  . ACCU-CHEK AVIVA PLUS test strip USE 1 STRIP TO CHECK GLUCOSE UP TO 3 TIMES DAILY AS DIRECTED.  . Accu-Chek Softclix Lancets lancets Use to check blood sugar up to 3 times daily as instructed  . acetaminophen (TYLENOL) 500 MG tablet Take 500 mg by mouth every 6 (six) hours as needed.  Marland Kitchen albuterol (PROAIR HFA) 108 (90 Base) MCG/ACT inhaler Inhale 2 puffs into the lungs every 4 (four) hours as needed for wheezing or shortness of breath.  Marland Kitchen atorvastatin (LIPITOR) 40 MG tablet Take 1 tablet (40 mg total) by mouth daily.  . baclofen (LIORESAL) 10 MG tablet Take 1 tablet (10 mg total) by mouth 3 (three) times daily as needed for muscle spasms.  . benztropine (COGENTIN) 1 MG tablet Take 1 tablet (1 mg total) by mouth 3 (three) times daily.  . blood glucose meter kit and supplies Dispense based on patient and insurance preference. Use up to four times daily as directed. (FOR ICD-10 E10.9, E11.9).  Marland Kitchen Blood Glucose Monitoring Suppl (ACCU-CHEK AVIVA PLUS) w/Device KIT Use to check blood sugar up to 3 times daily  . Calcium Carb-Cholecalciferol (CALCIUM 600+D3 PO) Take 1 tablet by mouth daily.  . Dulaglutide (TRULICITY) 1.5 PJ/8.2NK SOPN  Inject 1.5 mg into the skin every 7 (seven) days.  Marland Kitchen esomeprazole (NEXIUM) 20 MG capsule Take 1 capsule (20 mg total) by mouth daily at 12 noon.  . Fluticasone-Umeclidin-Vilant (TRELEGY ELLIPTA) 100-62.5-25 MCG/INH AEPB Inhale 1 puff into the lungs daily.  Marland Kitchen gabapentin (NEURONTIN) 300 MG capsule Take 1 capsule (300 mg total) by mouth 2 (two) times daily. For restless legs  . linaclotide (LINZESS) 145 MCG CAPS capsule Take 1 capsule (145 mcg total) by mouth daily before breakfast.  . lisinopril (ZESTRIL) 10 MG tablet Take 1 tablet (10 mg total) by mouth daily.  Marland Kitchen lithium carbonate 300 MG capsule Take 1 capsule (300 mg total) by mouth 2 (two) times daily with a meal.  . metFORMIN (GLUCOPHAGE) 500 MG tablet Take 500 mg by mouth 2 (two) times daily.  . Multiple Vitamins-Minerals (MULTIVITAMIN ADULT EXTRA C) CHEW Chew by mouth.  . norethindrone-ethinyl estradiol (JUNEL FE 1/20) 1-20 MG-MCG tablet Take 1 tablet by mouth daily.  Marland Kitchen OLANZapine (ZYPREXA) 15 MG tablet Take 1 tablet (15 mg total) by mouth at bedtime.  Marland Kitchen omega-3 acid ethyl esters (LOVAZA) 1 g capsule Take 1 capsule by mouth twice daily  . ondansetron (ZOFRAN-ODT) 4 MG disintegrating tablet Take 1 tablet (4 mg total) by mouth every 6 (six) hours as needed for nausea.  . primidone (MYSOLINE) 50 MG tablet Take 1 tablet (50 mg total) by mouth at bedtime.  . sertraline (ZOLOFT) 100 MG tablet Take 1 tablet (100 mg total) by mouth daily.  . traZODone (DESYREL) 100 MG  tablet TAKE 1 & 1/2 (ONE & ONE-HALF) TABLETS BY MOUTH AT BEDTIME FOR SLEEP   No facility-administered encounter medications on file as of 09/25/2019.     Goals Addressed    .  "I need more support" (pt-stated)        Current Barriers:  . Financial constraints related to affording rent, utilities, health care, etc,  . ADL IADL limitations . Social Isolation  Clinical Social Work Clinical Goal(s):  Marland Kitchen Over the next 120 days, patient will work with SW to address concerns related to  gaining additional financial support/resource education and connection . Over the next 120 days, patient will work with SW to address concerns related to gaining additional support/resource connection in order to maintain health . Over the next 120 days, patient will demonstrate improved adherence to self care as evidenced by implementing healthy self-care into her daily routine such as: attending all medical appointments, deep breathing exercsies, taking time for self-reflection, taking medications as prescribed, drinking water and daily exercise to improve mobility.   Interventions: . Patient interviewed and appropriate assessments performed . Provided patient with information about crisis support, housing support and financial support resources within the area. Patient's spouse is actively involved with [facility]. . LCSW discussed coping skills for stress and anxiety. LCSW provided self-care education to help manage her multiple health conditions and improve her mood.  . Discussed plans with patient for ongoing care management follow up and provided patient with direct contact information for care management team . Assisted patient/caregiver with obtaining information about health plan benefits . LCSW spoke with patient. They do not qualify for Medicaid due to income but continue to struggle to make ends meet. Spouse no longer working due to injuries from a recent car accident. Family receive $29.00 in food stamps and go to local food pantries. Family is struggles with paying for rent, utility bills and unpaid medical bills. [facility] has already assisted patient in the past with rent and another time with food and encouraged family to apply for the energy assistance program. LCSW provided patient with [facility]'s contact information as well.  . Patient reports that her mood has been stable and stayed consistent. Provided motivational interviewing and solution focused interventions during session. LCSW  used active and reflective listening and implemented appropriate interventions to help suppport patient and her emotional needs. Advised patient to implement deep breathing/grounding/meditation/self-care exercises into her daily routine to combat stressors (often financial).  . Patient reports that she has ongoing tremors and has not been able to see neurologist lately as she has an unpaid co-pay of $25.00. LCSW provided brief financial assistance resource education. Patient appreciative if education.   Patient Self Care Activities:  . Attends all scheduled provider appointments . Lacks social connections  Please see past updates related to this goal by clicking on the "Past Updates" button in the selected goal       Follow Up Plan: SW will follow up with patient by phone over the next quarter  Eula Fried, [place], MSW, [email or web address] Phone: [phone]

## 2019-10-02 ENCOUNTER — Telehealth: Payer: Medicare HMO

## 2019-10-02 ENCOUNTER — Telehealth: Payer: Self-pay | Admitting: General Practice

## 2019-10-02 ENCOUNTER — Telehealth: Payer: Self-pay

## 2019-10-02 NOTE — Telephone Encounter (Signed)
Noted  

## 2019-10-02 NOTE — Telephone Encounter (Signed)
  Chronic Care Management   Outreach Note  10/02/2019 Name: Vanessa Baker MRN: [id number] DOB: 1975-06-13  Referred by: Venita Lick, NP Reason for referral : Chronic Care Management (Initial outreach for Chronic Disease Managment and Care Coordination Needs)   An unsuccessful telephone outreach was attempted today. The patient was referred to the case management team for assistance with care management and care coordination.   Follow Up Plan: A HIPPA compliant phone message was left for the patient providing contact information and requesting a return call.   Noreene Larsson RN, MSN, [facility] Mobile: [phone]

## 2019-10-02 NOTE — Telephone Encounter (Signed)
    MA8/27/2021 1st Attempt  Name: [name]   MRN: [id number]   DOB: 1975/06/20   AGE: 44 y.o.   GENDER: female   PCP Venita Lick, NP.   10/02/19 Left message on voicemail for patient to return my call regarding financial assistance for bill and food resources.  Will attempt to call again next week.    Aliceson Dolbow, AAS Paralegal, [place] . Embedded Care Coordination [facility]  Care Management  [address], [place] [address] [email or web address]  [phone]  [email or web address]

## 2019-10-05 DIAGNOSIS — J4541 Moderate persistent asthma with (acute) exacerbation: Secondary | ICD-10-CM | POA: Diagnosis not present

## 2019-10-05 DIAGNOSIS — J9601 Acute respiratory failure with hypoxia: Secondary | ICD-10-CM | POA: Diagnosis not present

## 2019-10-05 DIAGNOSIS — C3400 Malignant neoplasm of unspecified main bronchus: Secondary | ICD-10-CM | POA: Diagnosis not present

## 2019-10-06 ENCOUNTER — Telehealth: Payer: Self-pay

## 2019-10-06 NOTE — Chronic Care Management (AMB) (Signed)
  Care Management   Note  10/06/2019 Name: Vanessa Baker MRN: [id number] DOB: 10-02-1975  Vanessa Baker is a 44 y.o. year old female who is a primary care patient of Venita Lick, NP and is actively engaged with the care management team. I reached out to [employer] by phone today to assist with re-scheduling an initial visit with the RN Case Manager Pharmacist  Follow up plan: Unsuccessful telephone outreach attempt made. A HIPPA compliant phone message was left for the patient providing contact information and requesting a return call.  The care management team will reach out to the patient again over the next 7 days.  If patient returns call to provider office, please advise to call [facility]  [facility] [address], [place], [place], [place] [address] Direct Dial: [phone] [email or web address] Website: [email or web address]

## 2019-10-07 ENCOUNTER — Ambulatory Visit: Payer: Medicare HMO | Admitting: Nurse Practitioner

## 2019-10-08 NOTE — Telephone Encounter (Signed)
    MA9/03/2019 2nd Attempt  Name: Vanessa Baker   MRN: [id number]   DOB: 03-Sep-1975   AGE: 44 y.o.   GENDER: female   PCP Venita Lick, NP.   10/08/19 Left message on voicemail for patient to return my call regarding financial assistance for bill and food resources.  Will attempt to call again next week.    Gibran Veselka, AAS Paralegal, [place] . Embedded Care Coordination [facility]  Care Management  [address], [place] [address] [email or web address]  [phone]   [email or web address]

## 2019-10-14 DIAGNOSIS — R69 Illness, unspecified: Secondary | ICD-10-CM | POA: Diagnosis not present

## 2019-10-15 NOTE — Telephone Encounter (Signed)
    MA9/10/2019 3rd Attempt  Name: Vanessa Baker   MRN: [id number]   DOB: 08/04/1975   AGE: 44 y.o.   GENDER: female   PCP Marnee Guarneri T, NP.    10/15/19 3rd attempt to contact. Left message on voicemail for patient to return my call regarding financial assistance for bill and food resources. Closing referral unable to contact after 3 attempts.     Aeriel Boulay, AAS Paralegal, [place] . Embedded Care Coordination [facility]  Care Management  [address], [place] [address] [email or web address]  [phone]   [email or web address]

## 2019-10-15 NOTE — Telephone Encounter (Signed)
Noted  

## 2019-10-20 NOTE — Telephone Encounter (Signed)
Pt has been r/s 11/27/2019

## 2019-10-20 NOTE — Telephone Encounter (Signed)
Pt has been r/s for 12/04/2019

## 2019-10-20 NOTE — Chronic Care Management (AMB) (Signed)
  Care Management   Note  10/20/2019 Name: [name] MRN: [id number] DOB: 08-10-1975  Vanessa Baker is a 44 y.o. year old female who is a primary care patient of Venita Lick, NP and is actively engaged with the care management team. I reached out to [employer] by phone today to assist with re-scheduling an initial visit with the RN Case Manager Pharmacist  Follow up plan: Telephone appointment with care management team member scheduled for: Pharm D 11/27/2019 RNCM 12/04/2019  Noreene Larsson, [place], [place], [place] [address] Direct Dial: [phone] [email or web address]: [email or web address]

## 2019-11-05 DIAGNOSIS — J4541 Moderate persistent asthma with (acute) exacerbation: Secondary | ICD-10-CM | POA: Diagnosis not present

## 2019-11-05 DIAGNOSIS — C3400 Malignant neoplasm of unspecified main bronchus: Secondary | ICD-10-CM | POA: Diagnosis not present

## 2019-11-05 DIAGNOSIS — J9601 Acute respiratory failure with hypoxia: Secondary | ICD-10-CM | POA: Diagnosis not present

## 2019-11-24 ENCOUNTER — Other Ambulatory Visit: Payer: Self-pay | Admitting: Family Medicine

## 2019-11-24 ENCOUNTER — Other Ambulatory Visit: Payer: Self-pay | Admitting: Psychiatry

## 2019-11-24 DIAGNOSIS — E781 Pure hyperglyceridemia: Secondary | ICD-10-CM

## 2019-11-24 DIAGNOSIS — F3130 Bipolar disorder, current episode depressed, mild or moderate severity, unspecified: Secondary | ICD-10-CM

## 2019-11-24 DIAGNOSIS — F4310 Post-traumatic stress disorder, unspecified: Secondary | ICD-10-CM

## 2019-11-24 DIAGNOSIS — E785 Hyperlipidemia, unspecified: Secondary | ICD-10-CM

## 2019-11-24 DIAGNOSIS — G40909 Epilepsy, unspecified, not intractable, without status epilepticus: Secondary | ICD-10-CM

## 2019-11-27 ENCOUNTER — Telehealth: Payer: Self-pay

## 2019-11-27 ENCOUNTER — Telehealth: Payer: Medicare HMO

## 2019-11-27 ENCOUNTER — Telehealth: Payer: Self-pay | Admitting: Pharmacist

## 2019-11-27 NOTE — Progress Notes (Signed)
  Chronic Care Management   Outreach Note  11/27/2019 Name: Vanessa Baker MRN: [id number] DOB: [birth year]  Referred by: Venita Lick, NP Reason for referral : No chief complaint on file.   A second unsuccessful telephone outreach was attempted today. The patient was referred to pharmacist for assistance with care management and care coordination.   Junita Push. [place] Kingfisher PharmD, [facility] [phone]

## 2019-11-27 NOTE — Progress Notes (Deleted)
Chronic Care Management Pharmacy  Name: Vanessa Baker  MRN: [id number] DOB: 10/09/1975   Chief Complaint/ HPI  Vanessa Baker,  44 y.o. , female presents for their Initial CCM visit with the clinical pharmacist via telephone.  PCP : Venita Lick, NP Patient Care Team: Venita Lick, NP as PCP - General (Nurse Practitioner) Greg Cutter, LCSW as Social Worker (Licensed Clinical Social Worker) Melrose Nakayama, Doneta Public, MD as Consulting Physician (Neurology) Ursula Alert, MD as Consulting Physician (Psychiatry)  Their chronic conditions include: {USCCMDZASSESSMENTOPTIONS:23563}   Office Visits: 08/25/19- Marnee Guarneri, NP- initial appt  Consult Visit: 08/26/19-Dr. Melrose Nakayama, Neurology -   Allergies  Allergen Reactions  . Amitriptyline Other (See Comments)    Confusion, paralysis    Medications: Outpatient Encounter Medications as of 11/27/2019  Medication Sig  . ACCU-CHEK AVIVA PLUS test strip USE 1 STRIP TO CHECK GLUCOSE UP TO 3 TIMES DAILY AS DIRECTED.  . Accu-Chek Softclix Lancets lancets Use to check blood sugar up to 3 times daily as instructed  . acetaminophen (TYLENOL) 500 MG tablet Take 500 mg by mouth every 6 (six) hours as needed.  Marland Kitchen albuterol (PROAIR HFA) 108 (90 Base) MCG/ACT inhaler Inhale 2 puffs into the lungs every 4 (four) hours as needed for wheezing or shortness of breath.  Marland Kitchen atorvastatin (LIPITOR) 40 MG tablet Take 1 tablet (40 mg total) by mouth daily.  . baclofen (LIORESAL) 10 MG tablet Take 1 tablet (10 mg total) by mouth 3 (three) times daily as needed for muscle spasms.  . benztropine (COGENTIN) 1 MG tablet Take 1 tablet (1 mg total) by mouth 3 (three) times daily.  . blood glucose meter kit and supplies Dispense based on patient and insurance preference. Use up to four times daily as directed. (FOR ICD-10 E10.9, E11.9).  Marland Kitchen Blood Glucose Monitoring Suppl (ACCU-CHEK AVIVA PLUS) w/Device KIT Use to check blood sugar up to 3 times  daily  . Calcium Carb-Cholecalciferol (CALCIUM 600+D3 PO) Take 1 tablet by mouth daily.  . Dulaglutide (TRULICITY) 1.5 EM/7.5QG SOPN Inject 1.5 mg into the skin every 7 (seven) days.  Marland Kitchen esomeprazole (NEXIUM) 20 MG capsule Take 1 capsule (20 mg total) by mouth daily at 12 noon.  . Fluticasone-Umeclidin-Vilant (TRELEGY ELLIPTA) 100-62.5-25 MCG/INH AEPB Inhale 1 puff into the lungs daily.  Marland Kitchen gabapentin (NEURONTIN) 300 MG capsule Take 1 capsule (300 mg total) by mouth 2 (two) times daily. For restless legs  . linaclotide (LINZESS) 145 MCG CAPS capsule Take 1 capsule (145 mcg total) by mouth daily before breakfast.  . lisinopril (ZESTRIL) 10 MG tablet Take 1 tablet (10 mg total) by mouth daily.  Marland Kitchen lithium carbonate 300 MG capsule Take 1 capsule (300 mg total) by mouth 2 (two) times daily with a meal.  . metFORMIN (GLUCOPHAGE) 500 MG tablet Take 500 mg by mouth 2 (two) times daily.  . Multiple Vitamins-Minerals (MULTIVITAMIN ADULT EXTRA C) CHEW Chew by mouth.  . norethindrone-ethinyl estradiol (JUNEL FE 1/20) 1-20 MG-MCG tablet Take 1 tablet by mouth daily.  Marland Kitchen OLANZapine (ZYPREXA) 15 MG tablet TAKE 1 TABLET BY MOUTH AT BEDTIME  . omega-3 acid ethyl esters (LOVAZA) 1 g capsule Take 1 capsule by mouth twice daily  . ondansetron (ZOFRAN-ODT) 4 MG disintegrating tablet Take 1 tablet (4 mg total) by mouth every 6 (six) hours as needed for nausea.  . primidone (MYSOLINE) 50 MG tablet Take 1 tablet (50 mg total) by mouth at bedtime.  . sertraline (ZOLOFT) 100 MG tablet Take 1  tablet by mouth once daily  . traZODone (DESYREL) 100 MG tablet TAKE 1 & 1/2 (ONE & ONE-HALF) TABLETS BY MOUTH AT BEDTIME FOR SLEEP   No facility-administered encounter medications on file as of 11/27/2019.    Wt Readings from Last 3 Encounters:  08/25/19 171 lb 12.8 oz (77.9 kg)  05/07/19 174 lb 6.4 oz (79.1 kg)  04/30/19 182 lb 1.6 oz (82.6 kg)    Current Diagnosis/Assessment:    Goals Addressed   None     {CHL HP  Upstream Pharmacy Diagnosis/Assessment:[phone]}   Medication Management   Pt uses *** pharmacy for all medications Uses pill box? {Yes or If no, why not?:20788} Pt endorses ***% compliance  We discussed: {Pharmacy options:24294}  Plan  {US Pharmacy QVLD:44461}    Follow up: *** month phone visit  ***

## 2019-12-04 ENCOUNTER — Telehealth: Payer: Self-pay | Admitting: General Practice

## 2019-12-04 ENCOUNTER — Telehealth: Payer: Medicare HMO

## 2019-12-04 NOTE — Telephone Encounter (Signed)
  Chronic Care Management   Outreach Note  12/04/2019 Name: Vanessa Baker MRN: [id number] DOB: 10/07/1975  Referred by: Venita Lick, NP Reason for referral : Chronic Care Management (RNCM Initial for Chronic Disease Management and Care Coordination Needs: 2nd attempt)   A second unsuccessful telephone outreach was attempted today. The patient was referred to the case management team for assistance with care management and care coordination.   Follow Up Plan: A HIPAA compliant phone message was left for the patient providing contact information and requesting a return call.  Noreene Larsson RN, MSN, [facility] Mobile: [phone]

## 2019-12-05 DIAGNOSIS — J9601 Acute respiratory failure with hypoxia: Secondary | ICD-10-CM | POA: Diagnosis not present

## 2019-12-05 DIAGNOSIS — J4541 Moderate persistent asthma with (acute) exacerbation: Secondary | ICD-10-CM | POA: Diagnosis not present

## 2019-12-05 DIAGNOSIS — C3400 Malignant neoplasm of unspecified main bronchus: Secondary | ICD-10-CM | POA: Diagnosis not present

## 2019-12-08 ENCOUNTER — Other Ambulatory Visit: Payer: Self-pay

## 2019-12-08 ENCOUNTER — Ambulatory Visit (INDEPENDENT_AMBULATORY_CARE_PROVIDER_SITE_OTHER): Payer: Medicare HMO | Admitting: Family Medicine

## 2019-12-08 ENCOUNTER — Encounter: Payer: Self-pay | Admitting: Family Medicine

## 2019-12-08 VITALS — BP 138/83 | HR 76 | Temp 98.1°F | Wt 168.9 lb

## 2019-12-08 DIAGNOSIS — R1013 Epigastric pain: Secondary | ICD-10-CM | POA: Diagnosis not present

## 2019-12-08 DIAGNOSIS — Z1159 Encounter for screening for other viral diseases: Secondary | ICD-10-CM | POA: Diagnosis not present

## 2019-12-08 DIAGNOSIS — R197 Diarrhea, unspecified: Secondary | ICD-10-CM | POA: Diagnosis not present

## 2019-12-08 DIAGNOSIS — R21 Rash and other nonspecific skin eruption: Secondary | ICD-10-CM | POA: Diagnosis not present

## 2019-12-08 DIAGNOSIS — R5383 Other fatigue: Secondary | ICD-10-CM

## 2019-12-08 MED ORDER — CETIRIZINE HCL 10 MG PO TABS: 10.0000 mg | ORAL_TABLET | Freq: Every day | ORAL | 0 refills | Status: DC | PRN

## 2019-12-08 NOTE — Assessment & Plan Note (Addendum)
Appears consistent with allergic exposure but with unknown trigger. Not widespread and no involvement of mucous membranes. No findings to concern for drug eruption, infection. Will trial zyrtec.

## 2019-12-08 NOTE — Progress Notes (Signed)
   SUBJECTIVE:   CHIEF COMPLAINT / HPI:  Rash, loose stools  RASH  Had rash for 4 weeks on b/l arms, legs, back. Medications tried: calamine lotion, cortisone cream. Doesn't help. Alcohol burns. Similar rash in past: no Eyes also itch occasionally.  New medications or antibiotics: no Tick, Insect or new pet exposure: no Recent travel: no New detergent or soap: no Immunocompromised: no  Symptoms Itching: yes Pain over rash: no Feeling ill all over: yes. Lightheadedness, nausea, fatigue Fever: no Mouth sores: no Face or tongue swelling: no Trouble breathing: no Joint swelling or pain: no  DIARRHEA  Reports 3 week h/o loose stools, feeling hot/cold, occasional nausea, fatigue. Reports 4-5 BMs per day, type 5-6 on [place] stool chart. Normally has 1 BM every 2-3 days. Feels she has to have BM after eating, avoiding eating b/c of this. Denies loss of appetite, fevers, blood in stool, vomiting, night sweats, early satiety, abdominal pain or pain with BMs, personal or FH of GI cancer. Denies cough, night sweats, SOB. UTD with mammogram. Post-menopausal. CT Abd 04/2019 with fatty liver. S/p cholecystectomy 04/2019.  Per chart review, previously seen by [facility] in 2014 with endoscopy 2012 notable for gastritis with normal biopsy. Colonoscopy in 2014 adenomatous colonic polyp removed and colon biopsy with lymphocytic colitis. Diagnosed with IBS and bacterial overgrowth at that time. Evaluated again by GI in 03/2019 for chronic diarrhea and IBS symptoms with GI panel +campylobacter and recommended 3 day course of azithro. Celiac panel negative. Recommended repeat testing, colonoscopy and EGD after completion of antibiotic therapy but has not yet returned for f/u.  PERTINENT  PMH / PSH: h/o lymphocytic colitis, HTN, T2DM, migraine, HLD, polysubstance use d/o in remission, insomnia, obesity, PTSD, RLS, neuroleptic induced parkinsonism, asthma  OBJECTIVE:   BP 138/83   Pulse 76   Temp 98.1  F (36.7 C) (Oral)   Wt 168 lb 14.4 oz (76.6 kg)   LMP 11/26/2018 Comment: pt denies pregnancy-states no period in two years  SpO2 98%   BMI 30.69 kg/m   Gen: well appearing, in NAD HEENT: no mucosal lesions, MMM. No thyromegaly Cardiac: RRR, no murmur Lung: CTAB Abd: soft, nondistended. +BS. Surgical scars noted. Tenderness to palpation in RUQ and epigastric regions. No hepatomegaly. MSK: good tone, symmetric.  Skin: Few erythematous welts to upper R arm with occasional excoriation to lower bilateral arms. No pustules, vesicles, or papules present.  ASSESSMENT/PLAN:   Rash Appears consistent with allergic exposure but with unknown trigger. Not widespread and no involvement of mucous membranes. No findings to concern for drug eruption, infection. Will trial zyrtec.  Frequent loose stools 3 week reported history though per chart review has longstanding history of IBS symptoms and prior campylobacter infection. Benign abdominal exam today and no symptoms/findings warranting urgent imaging or referral. Suspect symptoms related to chronic process. However with h/o fatty liver and RUQ tenderness, will obtain LFTs to assess for inflammation. Will also obtain lipase given epigastric pain, h/o DM and on GLP-1 agonist. Also obtaining TSH and CBC to further assess fatigue, frequent stooling. Recommend f/u with GI soon regarding repeat GI panel testing and f/u colonoscopy, EGD as planned. Follow up in 2 weeks.     Myles Gip, DO

## 2019-12-08 NOTE — Assessment & Plan Note (Addendum)
3 week reported history though per chart review has longstanding history of IBS symptoms and prior campylobacter infection. Benign abdominal exam today and no symptoms/findings warranting urgent imaging or referral. Suspect symptoms related to chronic process. However with h/o fatty liver and RUQ tenderness, will obtain LFTs to assess for inflammation. Will also obtain lipase given epigastric pain, h/o DM and on GLP-1 agonist. Also obtaining TSH and CBC to further assess fatigue, frequent stooling. Recommend f/u with GI soon regarding repeat GI panel testing and f/u colonoscopy, EGD as planned. Follow up in 2 weeks.

## 2019-12-08 NOTE — Patient Instructions (Signed)
It was great to see you!  Our plans for today:  - Take the cetirizine for itching.  - We are checking some labs today, we will call you with these results. - Come back in 2 weeks for follow up.  Take care and seek immediate care sooner if you develop any concerns.   Dr. Ky Barban

## 2019-12-09 ENCOUNTER — Other Ambulatory Visit: Payer: Self-pay | Admitting: Family Medicine

## 2019-12-09 ENCOUNTER — Encounter: Payer: Self-pay | Admitting: Family Medicine

## 2019-12-09 ENCOUNTER — Telehealth: Payer: Self-pay | Admitting: Licensed Clinical Social Worker

## 2019-12-09 ENCOUNTER — Telehealth: Payer: Self-pay

## 2019-12-09 LAB — LIPASE: Lipase: 43 U/L (ref 14–72)

## 2019-12-09 LAB — CBC
Hematocrit: 45.2 % (ref 34.0–46.6)
Hemoglobin: 14.8 g/dL (ref 11.1–15.9)
MCH: 27.7 pg (ref 26.6–33.0)
MCHC: 32.7 g/dL (ref 31.5–35.7)
MCV: 85 fL (ref 79–97)
Platelets: 358 10*3/uL (ref 150–450)
RBC: 5.34 x10E6/uL — ABNORMAL HIGH (ref 3.77–5.28)
RDW: 13 % (ref 11.7–15.4)
WBC: 8.7 10*3/uL (ref 3.4–10.8)

## 2019-12-09 LAB — COMPREHENSIVE METABOLIC PANEL
ALT: 20 IU/L (ref 0–32)
AST: 23 IU/L (ref 0–40)
Albumin/Globulin Ratio: 1.8 (ref 1.2–2.2)
Albumin: 4.5 g/dL (ref 3.8–4.8)
Alkaline Phosphatase: 81 IU/L (ref 44–121)
BUN/Creatinine Ratio: 9 (ref 9–23)
BUN: 6 mg/dL (ref 6–24)
Bilirubin Total: <0.2 mg/dL (ref 0.0–1.2)
CO2: 28 mmol/L (ref 20–29)
Calcium: 9.7 mg/dL (ref 8.7–10.2)
Chloride: 100 mmol/L (ref 96–106)
Creatinine, Ser: 0.68 mg/dL (ref 0.57–1.00)
GFR calc Af Amer: 122 mL/min/{1.73_m2} (ref >59)
GFR calc non Af Amer: 106 mL/min/{1.73_m2} (ref >59)
Globulin, Total: 2.5 g/dL (ref 1.5–4.5)
Glucose: 86 mg/dL (ref 65–99)
Potassium: 4.3 mmol/L (ref 3.5–5.2)
Sodium: 139 mmol/L (ref 134–144)
Total Protein: 7 g/dL (ref 6.0–8.5)

## 2019-12-09 LAB — HEPATITIS C ANTIBODY: Hep C Virus Ab: 0.1 s/co ratio (ref 0.0–0.9)

## 2019-12-09 LAB — TSH: TSH: 1.34 u[IU]/mL (ref 0.450–4.500)

## 2019-12-09 NOTE — Telephone Encounter (Signed)
Chronic Care Management    Clinical Social Work General Follow Up Note  12/09/2019 Name: Selah Klang MRN: [id number] DOB: 04-04-1975  Harold Hedge Haymaker is a 44 y.o. year old female who is a primary care patient of Cannady, Barbaraann Faster, NP. The CCM team was consulted for assistance with [employer] .   Review of patient status, including review of consultants reports, relevant laboratory and other test results, and collaboration with appropriate care team members and the patient's provider was performed as part of comprehensive patient evaluation and provision of chronic care management services.    LCSW completed CCM outreach attempt today but was unable to reach patient successfully. A HIPPA compliant voice message was left encouraging patient to return call once available. LCSW will ask Scheduling Care Guide to reschedule CCM SW appointment with patient as well.   Outpatient Encounter Medications as of 12/09/2019  Medication Sig  . ACCU-CHEK AVIVA PLUS test strip USE 1 STRIP TO CHECK GLUCOSE UP TO 3 TIMES DAILY AS DIRECTED.  . Accu-Chek Softclix Lancets lancets Use to check blood sugar up to 3 times daily as instructed  . albuterol (PROAIR HFA) 108 (90 Base) MCG/ACT inhaler Inhale 2 puffs into the lungs every 4 (four) hours as needed for wheezing or shortness of breath.  Marland Kitchen atorvastatin (LIPITOR) 40 MG tablet Take 1 tablet (40 mg total) by mouth daily.  . baclofen (LIORESAL) 10 MG tablet Take 1 tablet (10 mg total) by mouth 3 (three) times daily as needed for muscle spasms.  . benztropine (COGENTIN) 1 MG tablet Take 1 tablet (1 mg total) by mouth 3 (three) times daily.  . blood glucose meter kit and supplies Dispense based on patient and insurance preference. Use up to four times daily as directed. (FOR ICD-10 E10.9, E11.9).  Marland Kitchen Blood Glucose Monitoring Suppl (ACCU-CHEK AVIVA PLUS) w/Device KIT Use to check blood sugar up to 3 times daily  . Calcium Carb-Cholecalciferol  (CALCIUM 600+D3 PO) Take 1 tablet by mouth daily.  . cetirizine (ZYRTEC) 10 MG tablet Take 1 tablet (10 mg total) by mouth daily as needed for allergies (itching).  . Dulaglutide (TRULICITY) 1.5 FX/9.0WI SOPN Inject 1.5 mg into the skin every 7 (seven) days.  Marland Kitchen esomeprazole (NEXIUM) 20 MG capsule Take 1 capsule (20 mg total) by mouth daily at 12 noon.  . Fluticasone-Umeclidin-Vilant (TRELEGY ELLIPTA) 100-62.5-25 MCG/INH AEPB Inhale 1 puff into the lungs daily.  Marland Kitchen gabapentin (NEURONTIN) 300 MG capsule Take 1 capsule (300 mg total) by mouth 2 (two) times daily. For restless legs  . linaclotide (LINZESS) 145 MCG CAPS capsule Take 1 capsule (145 mcg total) by mouth daily before breakfast. (Patient not taking: Reported on 12/08/2019)  . lisinopril (ZESTRIL) 10 MG tablet Take 1 tablet (10 mg total) by mouth daily.  Marland Kitchen lithium carbonate 300 MG capsule Take 1 capsule (300 mg total) by mouth 2 (two) times daily with a meal.  . metFORMIN (GLUCOPHAGE) 500 MG tablet Take 500 mg by mouth 2 (two) times daily.  . Multiple Vitamins-Minerals (MULTIVITAMIN ADULT EXTRA C) CHEW Chew by mouth.  . norethindrone-ethinyl estradiol (JUNEL FE 1/20) 1-20 MG-MCG tablet Take 1 tablet by mouth daily.  Marland Kitchen OLANZapine (ZYPREXA) 15 MG tablet TAKE 1 TABLET BY MOUTH AT BEDTIME  . omega-3 acid ethyl esters (LOVAZA) 1 g capsule Take 1 capsule by mouth twice daily  . ondansetron (ZOFRAN-ODT) 4 MG disintegrating tablet Take 1 tablet (4 mg total) by mouth every 6 (six) hours as needed for nausea. (Patient not  taking: Reported on 12/08/2019)  . primidone (MYSOLINE) 50 MG tablet Take 1 tablet (50 mg total) by mouth at bedtime.  . sertraline (ZOLOFT) 100 MG tablet Take 1 tablet by mouth once daily  . traZODone (DESYREL) 100 MG tablet TAKE 1 & 1/2 (ONE & ONE-HALF) TABLETS BY MOUTH AT BEDTIME FOR SLEEP   No facility-administered encounter medications on file as of 12/09/2019.    Follow Up Plan: [place] will reach out to patient to  reschedule appointment.   Eula Fried, BSW, MSW, [facility] Care Management [email or web address] Phone: [phone]

## 2019-12-10 ENCOUNTER — Other Ambulatory Visit: Payer: Self-pay | Admitting: Family Medicine

## 2019-12-10 DIAGNOSIS — E785 Hyperlipidemia, unspecified: Secondary | ICD-10-CM

## 2019-12-10 DIAGNOSIS — K529 Noninfective gastroenteritis and colitis, unspecified: Secondary | ICD-10-CM

## 2019-12-10 DIAGNOSIS — J410 Simple chronic bronchitis: Secondary | ICD-10-CM

## 2019-12-10 DIAGNOSIS — E1165 Type 2 diabetes mellitus with hyperglycemia: Secondary | ICD-10-CM

## 2019-12-10 DIAGNOSIS — Z794 Long term (current) use of insulin: Secondary | ICD-10-CM

## 2019-12-10 MED ORDER — TRELEGY ELLIPTA 100-62.5-25 MCG/INH IN AEPB: 1.0000 | INHALATION_SPRAY | Freq: Every day | RESPIRATORY_TRACT | 0 refills | Status: DC

## 2019-12-10 MED ORDER — ATORVASTATIN CALCIUM 40 MG PO TABS: 40.0000 mg | ORAL_TABLET | Freq: Every day | ORAL | 0 refills | Status: DC

## 2019-12-10 MED ORDER — LISINOPRIL 10 MG PO TABS
10.0000 mg | ORAL_TABLET | Freq: Every day | ORAL | 0 refills | Status: DC
Start: 2019-12-10 — End: 2020-08-23

## 2019-12-10 MED ORDER — METFORMIN HCL 500 MG PO TABS
500.0000 mg | ORAL_TABLET | Freq: Two times a day (BID) | ORAL | 0 refills | Status: DC
Start: 2019-12-10 — End: 2020-08-23

## 2019-12-10 MED ORDER — TRULICITY 1.5 MG/0.5ML ~~LOC~~ SOAJ: 1.5000 mg | SUBCUTANEOUS | 0 refills | Status: DC

## 2019-12-11 ENCOUNTER — Telehealth: Payer: Self-pay

## 2019-12-11 ENCOUNTER — Other Ambulatory Visit: Payer: Self-pay

## 2019-12-11 ENCOUNTER — Telehealth (INDEPENDENT_AMBULATORY_CARE_PROVIDER_SITE_OTHER): Payer: Medicare HMO | Admitting: Psychiatry

## 2019-12-11 ENCOUNTER — Encounter: Payer: Self-pay | Admitting: Psychiatry

## 2019-12-11 DIAGNOSIS — F4310 Post-traumatic stress disorder, unspecified: Secondary | ICD-10-CM | POA: Diagnosis not present

## 2019-12-11 DIAGNOSIS — G2581 Restless legs syndrome: Secondary | ICD-10-CM

## 2019-12-11 DIAGNOSIS — F315 Bipolar disorder, current episode depressed, severe, with psychotic features: Secondary | ICD-10-CM

## 2019-12-11 DIAGNOSIS — G2111 Neuroleptic induced parkinsonism: Secondary | ICD-10-CM | POA: Diagnosis not present

## 2019-12-11 DIAGNOSIS — Z79899 Other long term (current) drug therapy: Secondary | ICD-10-CM

## 2019-12-11 DIAGNOSIS — R69 Illness, unspecified: Secondary | ICD-10-CM | POA: Diagnosis not present

## 2019-12-11 DIAGNOSIS — F17201 Nicotine dependence, unspecified, in remission: Secondary | ICD-10-CM

## 2019-12-11 DIAGNOSIS — F1211 Cannabis abuse, in remission: Secondary | ICD-10-CM

## 2019-12-11 DIAGNOSIS — F1421 Cocaine dependence, in remission: Secondary | ICD-10-CM

## 2019-12-11 DIAGNOSIS — F1011 Alcohol abuse, in remission: Secondary | ICD-10-CM

## 2019-12-11 DIAGNOSIS — F3176 Bipolar disorder, in full remission, most recent episode depressed: Secondary | ICD-10-CM | POA: Insufficient documentation

## 2019-12-11 DIAGNOSIS — F17210 Nicotine dependence, cigarettes, uncomplicated: Secondary | ICD-10-CM | POA: Insufficient documentation

## 2019-12-11 MED ORDER — LITHIUM CARBONATE 300 MG PO CAPS: 300.0000 mg | ORAL_CAPSULE | Freq: Two times a day (BID) | ORAL | 0 refills | Status: DC

## 2019-12-11 MED ORDER — OLANZAPINE 20 MG PO TABS: 20.0000 mg | ORAL_TABLET | Freq: Every day | ORAL | 0 refills | Status: DC

## 2019-12-11 MED ORDER — LITHIUM CARBONATE 150 MG PO CAPS: 150.0000 mg | ORAL_CAPSULE | Freq: Every day | ORAL | 0 refills | Status: DC

## 2019-12-11 MED ORDER — BENZTROPINE MESYLATE 1 MG PO TABS: 1.0000 mg | ORAL_TABLET | Freq: Three times a day (TID) | ORAL | 0 refills | Status: DC

## 2019-12-11 NOTE — Telephone Encounter (Signed)
Pt has been r/s  

## 2019-12-11 NOTE — Progress Notes (Signed)
Virtual Visit via Video Note  I connected with Vanessa Baker on 12/11/19 at 10:00 AM EDT by a video enabled telemedicine application and verified that I am speaking with the correct person using two identifiers.  Location Provider Location : [facility] Patient Location : Home  Participants: Patient , Provider    I discussed the limitations of evaluation and management by telemedicine and the availability of in person appointments. The patient expressed understanding and agreed to proceed.    I discussed the assessment and treatment plan with the patient. The patient was provided an opportunity to ask questions and all were answered. The patient agreed with the plan and demonstrated an understanding of the instructions.   The patient was advised to call back or seek an in-person evaluation if the symptoms worsen or if the condition fails to improve as anticipated.   [facility] MD OP Progress Note  12/11/2019 1:01 PM Vanessa Baker  MRN:  [id number]  Chief Complaint:  Chief Complaint    Follow-up     HPI: Vanessa Baker is a 44 year old Caucasian female, divorced, has a history of bipolar disorder, PTSD, neuroleptic induced parkinsonian syndrome, tobacco use disorder, cocaine use disorder, alcohol use disorder, hyperlipidemia, seizure disorder, IBS was evaluated by telemedicine today.  Patient today reports she did not keep her appointment as recommended after her last visit on 06/04/2019 since she was feeling good and she just forgot.  She however reports since the past couple of months her mood symptoms as getting worse.  She currently reports mood swings, depressive symptoms, sadness, sleep problems as well as auditory hallucinations.  She reports she has been hearing some noises since the past few weeks.  She did not elaborate on these.  Patient denies any suicidality, homicidality .  Patient reports she ran out of her olanzapine however has been compliant with her other  medications.  Reports psychosocial stressors of financial problems, her husband being unable to work which does have an impact on her mood.  Patient denies any other concerns today.    Visit Diagnosis:    ICD-10-CM   1. Bipolar disorder, current episode depressed, severe, with psychotic features ([place])  F31.5 OLANZapine (ZYPREXA) 20 MG tablet    lithium carbonate 300 MG capsule    lithium carbonate 150 MG capsule    Lithium level  2. PTSD (post-traumatic stress disorder)  F43.10 OLANZapine (ZYPREXA) 20 MG tablet  3. Neuroleptic induced parkinsonism (HCC)  G21.11 benztropine (COGENTIN) 1 MG tablet  4. Restless leg syndrome  G25.81   5. Cocaine use disorder, moderate, in sustained remission (HCC)  F14.21   6. Alcohol use disorder, mild, in early remission  F10.11   7. Cannabis use disorder, mild, in early remission  F12.11   8. Severe tobacco use disorder, in early remission  F17.201   9. High risk medication use  Z79.899     Past Psychiatric History: I have reviewed past psychiatric history from my progress note on 04/02/2017.  Past trials of Seroquel, Latuda, Ambien, Klonopin, Topamax, gabapentin, Trintellix, Abilify, Rexulti, Artane  Past Medical History:  Past Medical History:  Diagnosis Date  . Allergy   . Anxiety   . Asthma   . Bipolar disorder ([place])   . Bursitis   . Depression   . Diabetes mellitus without complication ([place])   . Essential hypertension 03/28/2017  . Frequent headaches   . GERD (gastroesophageal reflux disease)   . Headache   . High cholesterol   . Hypertension   .  Irritable bowel syndrome (IBS)   . Irritable colon 02/23/2010  . Migraine 04/02/2017  . Obesity (BMI 30-39.9) 11/25/2013  . Seizure ([place]) [phone]  . Seizures ([place])   . Tremor     Past Surgical History:  Procedure Laterality Date  . CHOLECYSTECTOMY N/A 04/27/2019   Procedure: LAPAROSCOPIC CHOLECYSTECTOMY;  Surgeon: Fredirick Maudlin, MD;  Location: [facility] ORS;  Service: General;  Laterality:  N/A;  . NO PAST SURGERIES      Family Psychiatric History: I have reviewed family psychiatric history from my progress note on 04/02/2017.  Family History:  Family History  Problem Relation Age of Onset  . Bipolar disorder Mother   . Schizophrenia Mother   . Hypertension Mother   . Diabetes Mother   . Cancer Mother   . Anxiety disorder Mother   . ADD / ADHD Mother   . Alcohol abuse Mother   . Drug abuse Mother   . Heart disease Mother   . Bipolar disorder Sister   . Anxiety disorder Sister   . Drug abuse Sister   . Bipolar disorder Brother   . Anxiety disorder Brother   . Drug abuse Brother   . Hypertension Father   . Cancer Father   . Diabetes Father   . Breast cancer Maternal Aunt   . Leukemia Maternal Aunt   . Breast cancer Paternal Aunt   . Breast cancer Maternal Grandmother   . Alcohol abuse Maternal Grandmother   . Bipolar disorder Maternal Grandmother   . Breast cancer Paternal Grandmother   . Parkinson's disease Paternal Grandmother   . Alcohol abuse Maternal Grandfather   . Prostate cancer Maternal Grandfather   . Alcohol abuse Maternal Uncle   . Multiple myeloma Maternal Uncle   . ADD / ADHD Son   . Throat cancer Maternal Aunt   . Tremor Neg Hx     Social History: I have reviewed social history from my progress note on 04/02/2017. Social History   Socioeconomic History  . Marital status: Married    Spouse name: dewey   . Number of children: 1  . Years of education: 56  . Highest education level: Associate degree: occupational, Hotel manager, or vocational program  Occupational History  . Occupation: disability  Tobacco Use  . Smoking status: Former Smoker    Packs/day: 0.50    Years: 27.00    Pack years: 13.50    Types: Cigarettes    Quit date: 04/26/2019    Years since quitting: 0.6  . Smokeless tobacco: Never Used  Vaping Use  . Vaping Use: Former  Substance and Sexual Activity  . Alcohol use: Yes    Comment: very rarely  . Drug use: No  .  Sexual activity: Yes    Partners: Male    Birth control/protection: None  Other Topics Concern  . Not on file  Social History Narrative   Lives with husband, [name] (on [place])   Right-handed   Caffeine use: 1 cup coffee a day, 2 soft drinks per day   Social Determinants of Health   Financial Resource Strain: High Risk  . Difficulty of Paying Living Expenses: Hard  Food Insecurity: Food Insecurity Present  . Worried About Charity fundraiser in the Last Year: Often true  . Ran Out of Food in the Last Year: Often true  Transportation Needs: No Transportation Needs  . Lack of Transportation (Medical): No  . Lack of Transportation (Non-Medical): No  Physical Activity: Inactive  . Days of Exercise  per Week: 0 days  . Minutes of Exercise per Session: 0 min  Stress: Stress Concern Present  . Feeling of Stress : To some extent  Social Connections: Moderately Isolated  . Frequency of Communication with Friends and Family: Three times a week  . Frequency of Social Gatherings with Friends and Family: Three times a week  . Attends Religious Services: Never  . Active Member of Clubs or Organizations: No  . Attends Archivist Meetings: Never  . Marital Status: Living with partner    Allergies:  Allergies  Allergen Reactions  . Amitriptyline Other (See Comments)    Confusion, paralysis    Metabolic Disorder Labs: Lab Results  Component Value Date   HGBA1C 5.6 08/25/2019   MPG 111.15 07/08/2019   MPG 128.37 04/27/2019   Lab Results  Component Value Date   PROLACTIN 8.9 07/08/2019   PROLACTIN 4.6 (L) 04/22/2017   Lab Results  Component Value Date   CHOL 105 08/25/2019   TRIG 183 (H) 08/25/2019   HDL 29 (L) 08/25/2019   CHOLHDL 3.2 07/08/2019   VLDL 63 (H) 07/08/2019   LDLCALC 46 08/25/2019   LDLCALC 10 07/08/2019   Lab Results  Component Value Date   TSH 1.340 12/08/2019   TSH 1.380 08/25/2019    Therapeutic Level Labs: Lab Results  Component  Value Date   LITHIUM <0.1 (L) 08/25/2019   LITHIUM 0.32 (L) 11/14/2018   Lab Results  Component Value Date   VALPROATE 103 (H) 04/26/2019   VALPROATE 61.4 08/13/2017   No components found for:  CBMZ  Current Medications: Current Outpatient Medications  Medication Sig Dispense Refill  . ACCU-CHEK AVIVA PLUS test strip USE 1 STRIP TO CHECK GLUCOSE UP TO 3 TIMES DAILY AS DIRECTED. 300 each 1  . Accu-Chek Softclix Lancets lancets Use to check blood sugar up to 3 times daily as instructed 300 each 1  . albuterol (PROAIR HFA) 108 (90 Base) MCG/ACT inhaler Inhale 2 puffs into the lungs every 4 (four) hours as needed for wheezing or shortness of breath. 8 g 5  . amantadine (SYMMETREL) 100 MG capsule Take by mouth at bedtime.    Marland Kitchen atorvastatin (LIPITOR) 40 MG tablet Take 1 tablet (40 mg total) by mouth daily. 30 tablet 0  . baclofen (LIORESAL) 10 MG tablet Take 1 tablet (10 mg total) by mouth 3 (three) times daily as needed for muscle spasms. 90 tablet 0  . benztropine (COGENTIN) 1 MG tablet Take 1 tablet (1 mg total) by mouth 3 (three) times daily. 270 tablet 0  . blood glucose meter kit and supplies Dispense based on patient and insurance preference. Use up to four times daily as directed. (FOR ICD-10 E10.9, E11.9). 1 each 0  . Blood Glucose Monitoring Suppl (ACCU-CHEK AVIVA PLUS) w/Device KIT Use to check blood sugar up to 3 times daily 1 kit 0  . Calcium Carb-Cholecalciferol (CALCIUM 600+D3 PO) Take 1 tablet by mouth daily.    . cetirizine (ZYRTEC) 10 MG tablet Take 1 tablet (10 mg total) by mouth daily as needed for allergies (itching). 30 tablet 0  . Dulaglutide (TRULICITY) 1.5 YB/0.1BP SOPN Inject 1.5 mg into the skin every 7 (seven) days. 2 mL 0  . esomeprazole (NEXIUM) 20 MG capsule Take 1 capsule (20 mg total) by mouth daily at 12 noon. 90 capsule 3  . Fluticasone-Umeclidin-Vilant (TRELEGY ELLIPTA) 100-62.5-25 MCG/INH AEPB Inhale 1 puff into the lungs daily. 30 each 0  . gabapentin  (NEURONTIN) 300 MG capsule  Take 1 capsule (300 mg total) by mouth 2 (two) times daily. For restless legs 180 capsule 1  . linaclotide (LINZESS) 145 MCG CAPS capsule Take 1 capsule (145 mcg total) by mouth daily before breakfast. (Patient not taking: Reported on 12/08/2019) 30 capsule 2  . lisinopril (ZESTRIL) 10 MG tablet Take 1 tablet (10 mg total) by mouth daily. 30 tablet 0  . lithium carbonate 150 MG capsule Take 1 capsule (150 mg total) by mouth daily with supper. To be combined with 300 mg with supper 90 capsule 0  . lithium carbonate 300 MG capsule Take 1 capsule (300 mg total) by mouth 2 (two) times daily with a meal. 180 capsule 0  . metFORMIN (GLUCOPHAGE) 500 MG tablet Take 1 tablet (500 mg total) by mouth 2 (two) times daily. 60 tablet 0  . Multiple Vitamins-Minerals (MULTIVITAMIN ADULT EXTRA C) CHEW Chew by mouth.    . norethindrone-ethinyl estradiol (JUNEL FE 1/20) 1-20 MG-MCG tablet Take 1 tablet by mouth daily. 84 tablet 0  . OLANZapine (ZYPREXA) 20 MG tablet Take 1 tablet (20 mg total) by mouth at bedtime. 90 tablet 0  . omega-3 acid ethyl esters (LOVAZA) 1 g capsule Take 1 capsule by mouth twice daily 180 capsule 1  . ondansetron (ZOFRAN-ODT) 4 MG disintegrating tablet Take 1 tablet (4 mg total) by mouth every 6 (six) hours as needed for nausea. (Patient not taking: Reported on 12/08/2019) 20 tablet 0  . primidone (MYSOLINE) 50 MG tablet Take 1 tablet (50 mg total) by mouth at bedtime. 90 tablet 1  . sertraline (ZOLOFT) 100 MG tablet Take 1 tablet by mouth once daily 90 tablet 0  . traZODone (DESYREL) 100 MG tablet TAKE 1 & 1/2 (ONE & ONE-HALF) TABLETS BY MOUTH AT BEDTIME FOR SLEEP 135 tablet 0   No current facility-administered medications for this visit.     Musculoskeletal: Strength & Muscle Tone: UTA Gait & Station: UTA Patient leans: N/A  Psychiatric Specialty Exam: Review of Systems  Psychiatric/Behavioral: Positive for dysphoric mood, hallucinations and sleep  disturbance. Negative for agitation, behavioral problems, confusion, decreased concentration, self-injury and suicidal ideas. The patient is nervous/anxious. The patient is not hyperactive.   All other systems reviewed and are negative.   Last menstrual period 11/26/2018.There is no height or weight on file to calculate BMI.  General Appearance: Casual  Eye Contact:  Fair  Speech:  Clear and Coherent  Volume:  Normal  Mood:  Anxious, Depressed and Dysphoric  Affect:  Congruent  Thought Process:  Goal Directed and Descriptions of Associations: Intact  Orientation:  Full (Time, Place, and Person)  Thought Content: Hallucinations: Auditory noises  Suicidal Thoughts:  No  Homicidal Thoughts:  No  Memory:  Immediate;   Fair Recent;   Fair Remote;   Fair  Judgement:  Fair  Insight:  Fair  Psychomotor Activity:  Normal  Concentration:  Concentration: Fair and Attention Span: Fair  Recall:  [employer] of Knowledge: Fair  Language: Fair  Akathisia:  No  Handed:  Right  AIMS (if indicated): UTA  Assets:  Communication Skills Desire for Improvement Housing Social Support  ADL's:  Intact  Cognition: WNL  Sleep:  Poor   Screenings: AIMS     Office Visit from 04/07/2018 in [facility] Office Visit from 01/01/2018 in [facility] Total Score 10 1    GAD-7     Office Visit from 08/25/2019 in [facility] Visit from 12/16/2018 in  [facility] Office Visit from 09/06/2017 in [facility]  Total GAD-7 Score _0 PHQ2-9     Office Visit from 08/25/2019 in [facility] Office Visit from 01/09/2019 in [facility] Office Visit from 12/30/2018 in [facility] Office Visit from 12/16/2018 in [facility] Office Visit from 07/25/2018 in [facility]  PHQ-2 Total Score 6 0 0 3 2  PHQ-9 Total Score 17 0 _1 Assessment and Plan: Maeghan Canny is a 44 year old Caucasian female who has a history of bipolar disorder, PTSD, panic disorder, drug induced parkinsonian symptoms, diabetes melitis, polysubstance abuse was evaluated by telemedicine today.  Patient is biologically predisposed given her history of trauma as well as multiple medical problems including mental health problems in her family, past history of substance abuse problems.  Patient is currently struggling with psychosocial stressors of financial problems.  Patient also noncompliant on medications will benefit from the following change.    Plan Bipolar disorder,mixed, with psychosis-unstable Increase lithium to 300 mg p.o. daily in the morning and 450 mg p.o. daily with supper. Zoloft 100 mg p.o. daily Increase Zyprexa to 20 mg p.o. nightly  Panic attacks-stable Zoloft 100 mg p.o. daily  PTSD-stable Zoloft 100 mg daily  Insomnia-unstable Trazodone 100 mg p.o. nightly Zyprexa will also help  Drug-induced Parkinson's disease-stable Cogentin 1 mg p.o. 3 times daily She will continue to follow-up with neurology for problems  Alcohol use disorder/cocaine and cannabis use disorder in remission. She continues to stay sober  Tobacco use disorder in remission She quit smoking on 04/26/2019.  High risk medication use-pending labs-reviewed lithium level-dated 08/25/2019-less than 0.1-unknown if patient is noncompliant, hemoglobin A1c-5.6-within normal limits, CBC-within normal limits, TSH-dated 12/08/2019-within normal limits, BUN/creatinine-dated 12/08/2019-within normal limits We will order labs-lithium level.  She will go to [facility] lab 5 days after starting the higher dosage of lithium.  Follow-up in clinic in 4 weeks or sooner if needed.  I have spent atleast 30 minutes face to face by video with patient today. More than 50 % of the time was spent for preparing to see the patient ( e.g., review of test, records ), ordering  medications and test ,psychoeducation and supportive psychotherapy and care coordination,as well as documenting clinical information in electronic health record,interpreting and communication of test results. This note was generated in part or whole with voice recognition software. Voice recognition is usually quite accurate but there are transcription errors that can and very often do occur. I apologize for any typographical errors that were not detected and corrected.        Ursula Alert, MD 12/11/2019, 1:01 PM

## 2019-12-11 NOTE — Chronic Care Management (AMB) (Signed)
  Care Management   Note  12/11/2019 Name: Vanessa Baker MRN: [id number] DOB: 1975-09-18  Harold Hedge Ripp is a 44 y.o. year old female who is a primary care patient of Venita Lick, NP and is actively engaged with the care management team. I reached out to [employer] by phone today to assist with re-scheduling a follow up visit with the RN Case Manager Pharmacist Licensed Clinical Social Worker  Follow up plan: Telephone appointment with care management team member scheduled for:01/22/2020 with LCSW  Patient declines further follow up and engagement by the care management teamRN CM and Pharm D . Appropriate care team members and provider have been notified via electronic communication.   Noreene Larsson, [place], [place],  [id number] Direct Dial: [phone] [email or web address]: [email or web address]

## 2019-12-11 NOTE — Telephone Encounter (Signed)
Pt decined r/s with Pharm D

## 2019-12-11 NOTE — Telephone Encounter (Signed)
Pt declined r/s with RN CM

## 2019-12-12 ENCOUNTER — Other Ambulatory Visit: Payer: Self-pay | Admitting: Family Medicine

## 2019-12-12 DIAGNOSIS — G40909 Epilepsy, unspecified, not intractable, without status epilepticus: Secondary | ICD-10-CM

## 2019-12-12 DIAGNOSIS — E781 Pure hyperglyceridemia: Secondary | ICD-10-CM

## 2019-12-15 ENCOUNTER — Encounter: Payer: Self-pay | Admitting: Nurse Practitioner

## 2019-12-15 ENCOUNTER — Ambulatory Visit (INDEPENDENT_AMBULATORY_CARE_PROVIDER_SITE_OTHER): Payer: Medicare HMO | Admitting: Nurse Practitioner

## 2019-12-15 ENCOUNTER — Other Ambulatory Visit: Payer: Self-pay

## 2019-12-15 VITALS — BP 120/73 | HR 76 | Temp 98.8°F | Ht 62.0 in | Wt 169.6 lb

## 2019-12-15 DIAGNOSIS — G2111 Neuroleptic induced parkinsonism: Secondary | ICD-10-CM

## 2019-12-15 DIAGNOSIS — E1169 Type 2 diabetes mellitus with other specified complication: Secondary | ICD-10-CM | POA: Diagnosis not present

## 2019-12-15 DIAGNOSIS — K219 Gastro-esophageal reflux disease without esophagitis: Secondary | ICD-10-CM

## 2019-12-15 DIAGNOSIS — R21 Rash and other nonspecific skin eruption: Secondary | ICD-10-CM

## 2019-12-15 DIAGNOSIS — E1159 Type 2 diabetes mellitus with other circulatory complications: Secondary | ICD-10-CM

## 2019-12-15 DIAGNOSIS — F3176 Bipolar disorder, in full remission, most recent episode depressed: Secondary | ICD-10-CM

## 2019-12-15 DIAGNOSIS — G2581 Restless legs syndrome: Secondary | ICD-10-CM | POA: Diagnosis not present

## 2019-12-15 DIAGNOSIS — R69 Illness, unspecified: Secondary | ICD-10-CM | POA: Diagnosis not present

## 2019-12-15 DIAGNOSIS — E538 Deficiency of other specified B group vitamins: Secondary | ICD-10-CM

## 2019-12-15 DIAGNOSIS — F17201 Nicotine dependence, unspecified, in remission: Secondary | ICD-10-CM

## 2019-12-15 DIAGNOSIS — J4540 Moderate persistent asthma, uncomplicated: Secondary | ICD-10-CM

## 2019-12-15 DIAGNOSIS — I152 Hypertension secondary to endocrine disorders: Secondary | ICD-10-CM

## 2019-12-15 DIAGNOSIS — F4310 Post-traumatic stress disorder, unspecified: Secondary | ICD-10-CM

## 2019-12-15 DIAGNOSIS — K529 Noninfective gastroenteritis and colitis, unspecified: Secondary | ICD-10-CM

## 2019-12-15 DIAGNOSIS — F1421 Cocaine dependence, in remission: Secondary | ICD-10-CM

## 2019-12-15 DIAGNOSIS — E669 Obesity, unspecified: Secondary | ICD-10-CM

## 2019-12-15 DIAGNOSIS — E785 Hyperlipidemia, unspecified: Secondary | ICD-10-CM

## 2019-12-15 LAB — BAYER DCA HB A1C WAIVED: HB A1C (BAYER DCA - WAIVED): 5.1 % (ref <7.0)

## 2019-12-15 MED ORDER — TRIAMCINOLONE ACETONIDE 0.1 % EX CREA: 1.0000 "application " | TOPICAL_CREAM | Freq: Two times a day (BID) | CUTANEOUS | 0 refills | Status: DC

## 2019-12-15 MED ORDER — BACLOFEN 10 MG PO TABS: 10.0000 mg | ORAL_TABLET | Freq: Three times a day (TID) | ORAL | 4 refills | Status: DC | PRN

## 2019-12-15 MED ORDER — OMEGA-3-ACID ETHYL ESTERS 1 G PO CAPS: 1.0000 | ORAL_CAPSULE | Freq: Two times a day (BID) | ORAL | 4 refills | Status: DC

## 2019-12-15 MED ORDER — ESOMEPRAZOLE MAGNESIUM 20 MG PO CPDR: 20.0000 mg | DELAYED_RELEASE_CAPSULE | Freq: Every day | ORAL | 4 refills | Status: DC

## 2019-12-15 MED ORDER — PREDNISONE 20 MG PO TABS: 40.0000 mg | ORAL_TABLET | Freq: Every day | ORAL | 0 refills | Status: AC

## 2019-12-15 MED ORDER — ALBUTEROL SULFATE HFA 108 (90 BASE) MCG/ACT IN AERS: 2.0000 | INHALATION_SPRAY | Freq: Four times a day (QID) | RESPIRATORY_TRACT | 4 refills | Status: DC | PRN

## 2019-12-15 MED ORDER — ADVAIR HFA 115-21 MCG/ACT IN AERO: 2.0000 | INHALATION_SPRAY | Freq: Two times a day (BID) | RESPIRATORY_TRACT | 12 refills | Status: DC

## 2019-12-15 MED ORDER — PRIMIDONE 50 MG PO TABS: 50.0000 mg | ORAL_TABLET | Freq: Every day | ORAL | 4 refills | Status: DC

## 2019-12-15 NOTE — Assessment & Plan Note (Signed)
Chronic, ongoing.  Denies SI/HI.  Continue collaboration with psychiatry and current medication regimen as prescribed by them.  Would benefit from therapy, but refuses at this time.  Highly recommended this due to her past trauma history and difficulty with libido.

## 2019-12-15 NOTE — Assessment & Plan Note (Signed)
Chronic, ongoing.  Continue Nexium daily and adjust as needed.  Refills sent in.

## 2019-12-15 NOTE — Assessment & Plan Note (Signed)
Continues to remain sustained from cocaine use, recommend continued cessation.

## 2019-12-15 NOTE — Assessment & Plan Note (Addendum)
Chronic, ongoing.  BP below goal today.  Continue Lisinopril daily, kidney protection in diabetes.  Urine ALB 30 recent visit.  A1C 5.1% today.  Recommend she monitor BP at home daily and document + bring to visits.  Focus on DASH diet.  CCM referral is in place.  Return to office in 3 months for follow-up.

## 2019-12-15 NOTE — Assessment & Plan Note (Signed)
Chronic, stable.  Continue collaboration with neurology and current medication regimen as prescribed by them.

## 2019-12-15 NOTE — Assessment & Plan Note (Signed)
Ongoing, with minimal improvement.  Will try Prednisone for 5 days and send in Triamcinolone cream.  Recommend she continue daily Zyrtec and avoid scratching at area.  For ongoing or worsening return to office immediately.
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
## 2019-12-15 NOTE — Assessment & Plan Note (Signed)
Chronic, ongoing.  A1C today 5.1%, well below goal.  Have discussed with her continuing Trulicity which offers benefit of diabetes control and weight loss, but working on reduction of Metformin -- starting by decreasing to once daily dosing x 1 week and if sugars remain <130 in morning then discontinuing Metformin and monitoring BS daily -- if stable continue off Metformin, but if elevations continue with once a day dosing. Recommend she monitor BS at home every morning, fasting, and document for provider + bring to visits.  Focus on diabetic diet.  CCM referral in place.  Return to office in 3 months.

## 2019-12-15 NOTE — Assessment & Plan Note (Signed)
Chronic, ongoing.  Recommend continued cessation of smoking.  Monitor closely for return to tobacco use.  Will discontinue Trelegy as she does not like powder aspect of this, due to insurance limits inhalers that can be used.  Will send in Advair for maintenance and Ventolin as needed -- scripts sent.  Work with CCM team in obtaining inhalers if cost an issue.  Return in 3 months, obtain spirometry.
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
## 2019-12-15 NOTE — Assessment & Plan Note (Signed)
Chronic, ongoing.  Continue Atorvastatin 40 MG daily and adjust as needed.  Lipid panel panel today.  Return in 3 months.

## 2019-12-15 NOTE — Patient Instructions (Signed)
Rash, Adult  A rash is a change in the color of your skin. A rash can also change the way your skin feels. There are many different conditions and factors that can cause a rash. Follow these instructions at home: The goal of treatment is to stop the itching and keep the rash from spreading. Watch for any changes in your symptoms. Let your doctor know about them. Follow these instructions to help with your condition: Medicine Take or apply over-the-counter and prescription medicines only as told by your doctor. These may include medicines:  To treat red or swollen skin (corticosteroid creams).  To treat itching.  To treat an allergy (oral antihistamines).  To treat very bad symptoms (oral corticosteroids).  Skin care  Put cool cloths (compresses) on the affected areas.  Do not scratch or rub your skin.  Avoid covering the rash. Make sure that the rash is exposed to air as much as possible. Managing itching and discomfort  Avoid hot showers or baths. These can make itching worse. A cold shower may help.  Try taking a bath with: ? Epsom salts. You can get these at your local pharmacy or grocery store. Follow the instructions on the package. ? Baking soda. Pour a small amount into the bath as told by your doctor. ? Colloidal oatmeal. You can get this at your local pharmacy or grocery store. Follow the instructions on the package.  Try putting baking soda paste onto your skin. Stir water into baking soda until it gets like a paste.  Try putting on a lotion that relieves itchiness (calamine lotion).  Keep cool and out of the sun. Sweating and being hot can make itching worse. General instructions   Rest as needed.  Drink enough fluid to keep your pee (urine) pale yellow.  Wear loose-fitting clothing.  Avoid scented soaps, detergents, and perfumes. Use gentle soaps, detergents, perfumes, and other cosmetic products.  Avoid anything that causes your rash. Keep a journal to  help track what causes your rash. Write down: ? What you eat. ? What cosmetic products you use. ? What you drink. ? What you wear. This includes jewelry.  Keep all follow-up visits as told by your doctor. This is important. Contact a doctor if:  You sweat at night.  You lose weight.  You pee (urinate) more than normal.  You pee less than normal, or you notice that your pee is a darker color than normal.  You feel weak.  You throw up (vomit).  Your skin or the whites of your eyes look yellow (jaundice).  Your skin: ? Tingles. ? Is numb.  Your rash: ? Does not go away after a few days. ? Gets worse.  You are: ? More thirsty than normal. ? More tired than normal.  You have: ? New symptoms. ? Pain in your belly (abdomen). ? A fever. ? Watery poop (diarrhea). Get help right away if:  You have a fever and your symptoms suddenly get worse.  You start to feel mixed up (confused).  You have a very bad headache or a stiff neck.  You have very bad joint pains or stiffness.  You have jerky movements that you cannot control (seizure).  Your rash covers all or most of your body. The rash may or may not be painful.  You have blisters that: ? Are on top of the rash. ? Grow larger. ? Grow together. ? Are painful. ? Are inside your nose or mouth.  You have a rash   that: ? Looks like purple pinprick-sized spots all over your body. ? Has a "bull's eye" or looks like a target. ? Is red and painful, causes your skin to peel, and is not from being in the sun too long. Summary  A rash is a change in the color of your skin. A rash can also change the way your skin feels.  The goal of treatment is to stop the itching and keep the rash from spreading.  Take or apply over-the-counter and prescription medicines only as told by your doctor.  Contact a doctor if you have new symptoms or symptoms that get worse.  Keep all follow-up visits as told by your doctor. This is  important. This information is not intended to replace advice given to you by your health care provider. Make sure you discuss any questions you have with your health care provider. Document Revised: 05/16/2018 Document Reviewed: 08/26/2017 Elsevier Patient Education  2020 [employer]  

## 2019-12-15 NOTE — Assessment & Plan Note (Signed)
Recommend continued cessation of smoking, quit in March 2021.  Will continue to monitor closely for return to smoking and if remains quit for one year will change to former smoker.

## 2019-12-15 NOTE — Assessment & Plan Note (Deleted)
Recommend continued cessation of smoking, quit in March 2021.  Will continue to monitor closely for return to smoking and if remains quit for one year will change to former smoker.

## 2019-12-16 ENCOUNTER — Encounter: Payer: Self-pay | Admitting: Nurse Practitioner

## 2019-12-16 DIAGNOSIS — E538 Deficiency of other specified B group vitamins: Secondary | ICD-10-CM | POA: Insufficient documentation

## 2019-12-16 LAB — LIPID PANEL W/O CHOL/HDL RATIO
Cholesterol, Total: 189 mg/dL (ref 100–199)
HDL: 34 mg/dL — ABNORMAL LOW (ref >39)
LDL Chol Calc (NIH): 95 mg/dL (ref 0–99)
Triglycerides: 359 mg/dL — ABNORMAL HIGH (ref 0–149)
VLDL Cholesterol Cal: 60 mg/dL — ABNORMAL HIGH (ref 5–40)

## 2019-12-16 LAB — VITAMIN B12: Vitamin B-12: 330 pg/mL (ref 232–1245)

## 2019-12-16 NOTE — Progress Notes (Signed)
Contacted via MyChart  Good evening Laela, your labs have returned.  There is some elevation on cholesterol check this time, compared to previous.  Were you fasting this visit?  Continue your Atorvastatin daily.  B12 level is on low side of normal, we like it above 300 and your level is 330.  I would recommend you start taking Vitamin B12 1000 MCG daily, which you can get in vitamin section.  This is good for nervous system health, including fatigue.  We see lower levels of this in some patients who take Metformin daily.  If any questions please let me know. Keep being awesome!!  Thank you for allowing me to participate in your care. Kindest regards, Linnell Swords

## 2019-12-22 ENCOUNTER — Telehealth (INDEPENDENT_AMBULATORY_CARE_PROVIDER_SITE_OTHER): Payer: Medicare HMO | Admitting: Psychiatry

## 2019-12-22 ENCOUNTER — Encounter: Payer: Self-pay | Admitting: Psychiatry

## 2019-12-22 ENCOUNTER — Other Ambulatory Visit: Payer: Self-pay

## 2019-12-22 DIAGNOSIS — F1011 Alcohol abuse, in remission: Secondary | ICD-10-CM

## 2019-12-22 DIAGNOSIS — G2581 Restless legs syndrome: Secondary | ICD-10-CM | POA: Diagnosis not present

## 2019-12-22 DIAGNOSIS — F4310 Post-traumatic stress disorder, unspecified: Secondary | ICD-10-CM

## 2019-12-22 DIAGNOSIS — F315 Bipolar disorder, current episode depressed, severe, with psychotic features: Secondary | ICD-10-CM | POA: Diagnosis not present

## 2019-12-22 DIAGNOSIS — R69 Illness, unspecified: Secondary | ICD-10-CM | POA: Diagnosis not present

## 2019-12-22 DIAGNOSIS — G2111 Neuroleptic induced parkinsonism: Secondary | ICD-10-CM | POA: Diagnosis not present

## 2019-12-22 DIAGNOSIS — R37 Sexual dysfunction, unspecified: Secondary | ICD-10-CM | POA: Diagnosis not present

## 2019-12-22 DIAGNOSIS — F1211 Cannabis abuse, in remission: Secondary | ICD-10-CM

## 2019-12-22 DIAGNOSIS — F1421 Cocaine dependence, in remission: Secondary | ICD-10-CM

## 2019-12-22 MED ORDER — SERTRALINE HCL 50 MG PO TABS: 50.0000 mg | ORAL_TABLET | Freq: Every day | ORAL | 1 refills | Status: DC

## 2019-12-22 MED ORDER — BUPROPION HCL ER (SR) 150 MG PO TB12: 150.0000 mg | ORAL_TABLET | ORAL | 1 refills | Status: DC

## 2019-12-22 NOTE — Progress Notes (Signed)
Virtual Visit via Telephone Note  I connected with Vanessa Baker on 12/22/19 at  3:00 PM EST by telephone and verified that I am speaking with the correct person using two identifiers.  Location Provider Location : [facility] Patient Location : Home  Participants: Patient , Provider   I discussed the limitations, risks, security and privacy concerns of performing an evaluation and management service by telephone and the availability of in person appointments. I also discussed with the patient that there may be a patient responsible charge related to this service. The patient expressed understanding and agreed to proceed. I discussed the assessment and treatment plan with the patient. The patient was provided an opportunity to ask questions and all were answered. The patient agreed with the plan and demonstrated an understanding of the instructions.  The patient was advised to call back or seek an in-person evaluation if the symptoms worsen or if the condition fails to improve as anticipated.   [facility] MD OP Progress Note  12/23/2019 8:26 AM Hester Debara Kamphuis  MRN:  [id number]  Chief Complaint:  Chief Complaint    Follow-up     HPI: Vanessa Baker is a 44 year old Caucasian female, divorced, has a history of bipolar disorder, PTSD, neuroleptic induced parkinsonian syndrome, tobacco use disorder, cocaine use disorder, alcohol use disorder, hyperlipidemia, IBS was evaluated by phone today.  Patient today reports she decided to call back for an appointment since she has been struggling with sexual dysfunction likely due to her medications.  She heard about Wellbutrin and would like to give it a try.  Patient reports she otherwise has been making some progress on the higher dosage of lithium.  She did not go up on the dosage until 3 days ago and hence has not been able to get lithium levels done yet.  She however agrees to get it done soon.  Patient reports sleep as  improving.  Patient denies any suicidality, homicidality or perceptual disturbances.  Patient denies any other concerns today.  Visit Diagnosis:    ICD-10-CM   1. Bipolar disorder, current episode depressed, severe, with psychotic features ([place])  F31.5   2. PTSD (post-traumatic stress disorder)  F43.10 sertraline (ZOLOFT) 50 MG tablet   improving  3. Neuroleptic induced parkinsonism ([place])  G21.11   4. Restless leg syndrome  G25.81   5. Cocaine use disorder, moderate, in sustained remission (HCC)  F14.21   6. Alcohol use disorder, mild, in early remission  F10.11   7. Cannabis use disorder, mild, in early remission  F12.11   8. Sexual dysfunction, unspecified  R37 buPROPion (WELLBUTRIN SR) 150 MG 12 hr tablet   likely due to SSRI   Past Psychiatric History: I have reviewed past psychiatric history from my progress note on 04/02/2017.  Past trials of Seroquel, Latuda, Ambien, Klonopin, Topamax, gabapentin, Trintellix, Abilify, Rexulti, Artane  Past Medical History:  Past Medical History:  Diagnosis Date  . Allergy   . Anxiety   . Asthma   . Bipolar disorder ([place])   . Bursitis   . Depression   . Diabetes mellitus without complication ([place])   . Essential hypertension 03/28/2017  . Frequent headaches   . GERD (gastroesophageal reflux disease)   . Headache   . High cholesterol   . Hypertension   . Irritable bowel syndrome (IBS)   . Irritable colon 02/23/2010  . Migraine 04/02/2017  . Obesity (BMI 30-39.9) 11/25/2013  . Seizure ([place]) [phone]  . Seizures ([place])   . Tremor  Past Surgical History:  Procedure Laterality Date  . CHOLECYSTECTOMY N/A 04/27/2019   Procedure: LAPAROSCOPIC CHOLECYSTECTOMY;  Surgeon: Duanne Guess, MD;  Location: [facility] ORS;  Service: General;  Laterality: N/A;  . NO PAST SURGERIES      Family Psychiatric History: I have reviewed family psychiatric history from my progress note on 04/02/2017  Family History:  Family History  Problem Relation Age of  Onset  . Bipolar disorder Mother   . Schizophrenia Mother   . Hypertension Mother   . Diabetes Mother   . Cancer Mother   . Anxiety disorder Mother   . ADD / ADHD Mother   . Alcohol abuse Mother   . Drug abuse Mother   . Heart disease Mother   . Bipolar disorder Sister   . Anxiety disorder Sister   . Drug abuse Sister   . Bipolar disorder Brother   . Anxiety disorder Brother   . Drug abuse Brother   . Hypertension Father   . Cancer Father   . Diabetes Father   . Breast cancer Maternal Aunt   . Leukemia Maternal Aunt   . Breast cancer Paternal Aunt   . Breast cancer Maternal Grandmother   . Alcohol abuse Maternal Grandmother   . Bipolar disorder Maternal Grandmother   . Breast cancer Paternal Grandmother   . Parkinson's disease Paternal Grandmother   . Alcohol abuse Maternal Grandfather   . Prostate cancer Maternal Grandfather   . Alcohol abuse Maternal Uncle   . Multiple myeloma Maternal Uncle   . ADD / ADHD Son   . Throat cancer Maternal Aunt   . Tremor Neg Hx     Social History: Reviewed social history from my progress note on 04/02/2017 Social History   Socioeconomic History  . Marital status: Married    Spouse name: dewey   . Number of children: 1  . Years of education: 54  . Highest education level: Associate degree: occupational, Scientist, product/process development, or vocational program  Occupational History  . Occupation: disability  Tobacco Use  . Smoking status: Former Smoker    Packs/day: 0.50    Years: 27.00    Pack years: 13.50    Types: Cigarettes    Quit date: 04/26/2019    Years since quitting: 0.6  . Smokeless tobacco: Never Used  Vaping Use  . Vaping Use: Former  Substance and Sexual Activity  . Alcohol use: Yes    Comment: very rarely  . Drug use: No  . Sexual activity: Yes    Partners: Male    Birth control/protection: None  Other Topics Concern  . Not on file  Social History Narrative   Lives with husband, Renelda Mom (on [place])   Right-handed    Caffeine use: 1 cup coffee a day, 2 soft drinks per day   Social Determinants of Health   Financial Resource Strain: High Risk  . Difficulty of Paying Living Expenses: Hard  Food Insecurity: Food Insecurity Present  . Worried About Programme researcher, broadcasting/film/video in the Last Year: Often true  . Ran Out of Food in the Last Year: Often true  Transportation Needs: No Transportation Needs  . Lack of Transportation (Medical): No  . Lack of Transportation (Non-Medical): No  Physical Activity: Inactive  . Days of Exercise per Week: 0 days  . Minutes of Exercise per Session: 0 min  Stress: Stress Concern Present  . Feeling of Stress : To some extent  Social Connections: Moderately Isolated  . Frequency of Communication with Friends  and Family: Three times a week  . Frequency of Social Gatherings with Friends and Family: Three times a week  . Attends Religious Services: Never  . Active Member of Clubs or Organizations: No  . Attends Banker Meetings: Never  . Marital Status: Living with partner    Allergies:  Allergies  Allergen Reactions  . Amitriptyline Other (See Comments)    Confusion, paralysis    Metabolic Disorder Labs: Lab Results  Component Value Date   HGBA1C 5.1 12/15/2019   MPG 111.15 07/08/2019   MPG 128.37 04/27/2019   Lab Results  Component Value Date   PROLACTIN 8.9 07/08/2019   PROLACTIN 4.6 (L) 04/22/2017   Lab Results  Component Value Date   CHOL 189 12/15/2019   TRIG 359 (H) 12/15/2019   HDL 34 (L) 12/15/2019   CHOLHDL 3.2 07/08/2019   VLDL 63 (H) 07/08/2019   LDLCALC 95 12/15/2019   LDLCALC 46 08/25/2019   Lab Results  Component Value Date   TSH 1.340 12/08/2019   TSH 1.380 08/25/2019    Therapeutic Level Labs: Lab Results  Component Value Date   LITHIUM <0.1 (L) 08/25/2019   LITHIUM 0.32 (L) 11/14/2018   Lab Results  Component Value Date   VALPROATE 103 (H) 04/26/2019   VALPROATE 61.4 08/13/2017   No components found for:   CBMZ  Current Medications: Current Outpatient Medications  Medication Sig Dispense Refill  . tiZANidine (ZANAFLEX) 4 MG tablet     . ACCU-CHEK AVIVA PLUS test strip USE 1 STRIP TO CHECK GLUCOSE UP TO 3 TIMES DAILY AS DIRECTED. 300 each 1  . Accu-Chek Softclix Lancets lancets Use to check blood sugar up to 3 times daily as instructed 300 each 1  . albuterol (VENTOLIN HFA) 108 (90 Base) MCG/ACT inhaler Inhale 2 puffs into the lungs every 6 (six) hours as needed for wheezing or shortness of breath. 18 g 4  . amantadine (SYMMETREL) 100 MG capsule Take by mouth at bedtime.    Marland Kitchen atorvastatin (LIPITOR) 40 MG tablet Take 1 tablet (40 mg total) by mouth daily. 30 tablet 0  . baclofen (LIORESAL) 10 MG tablet Take 1 tablet (10 mg total) by mouth 3 (three) times daily as needed for muscle spasms. 90 tablet 4  . benztropine (COGENTIN) 1 MG tablet Take 1 tablet (1 mg total) by mouth 3 (three) times daily. 270 tablet 0  . blood glucose meter kit and supplies Dispense based on patient and insurance preference. Use up to four times daily as directed. (FOR ICD-10 E10.9, E11.9). 1 each 0  . Blood Glucose Monitoring Suppl (ACCU-CHEK AVIVA PLUS) w/Device KIT Use to check blood sugar up to 3 times daily 1 kit 0  . buPROPion (WELLBUTRIN SR) 150 MG 12 hr tablet Take 1 tablet (150 mg total) by mouth as directed. Start taking 1 tablet daily in the AM for 1 week and increase to 1 tablet in the AM and PM after a week 60 tablet 1  . Calcium Carb-Cholecalciferol (CALCIUM 600+D3 PO) Take 1 tablet by mouth daily.    . cetirizine (ZYRTEC) 10 MG tablet Take 1 tablet (10 mg total) by mouth daily as needed for allergies (itching). 30 tablet 0  . Dulaglutide (TRULICITY) 1.5 MG/0.5ML SOPN Inject 1.5 mg into the skin every 7 (seven) days. 2 mL 0  . esomeprazole (NEXIUM) 20 MG capsule Take 1 capsule (20 mg total) by mouth daily at 12 noon. 90 capsule 4  . fluticasone-salmeterol (ADVAIR HFA) 115-21 MCG/ACT inhaler  Inhale 2 puffs into  the lungs 2 (two) times daily. 1 each 12  . gabapentin (NEURONTIN) 300 MG capsule Take 1 capsule (300 mg total) by mouth 2 (two) times daily. For restless legs 180 capsule 1  . lisinopril (ZESTRIL) 10 MG tablet Take 1 tablet (10 mg total) by mouth daily. 30 tablet 0  . lithium carbonate 150 MG capsule Take 1 capsule (150 mg total) by mouth daily with supper. To be combined with 300 mg with supper 90 capsule 0  . lithium carbonate 300 MG capsule Take 1 capsule (300 mg total) by mouth 2 (two) times daily with a meal. 180 capsule 0  . metFORMIN (GLUCOPHAGE) 500 MG tablet Take 1 tablet (500 mg total) by mouth 2 (two) times daily. 60 tablet 0  . Multiple Vitamins-Minerals (MULTIVITAMIN ADULT EXTRA C) CHEW Chew by mouth.    . norethindrone-ethinyl estradiol (JUNEL FE 1/20) 1-20 MG-MCG tablet Take 1 tablet by mouth daily. 84 tablet 0  . OLANZapine (ZYPREXA) 20 MG tablet Take 1 tablet (20 mg total) by mouth at bedtime. 90 tablet 0  . omega-3 acid ethyl esters (LOVAZA) 1 g capsule Take 1 capsule (1 g total) by mouth 2 (two) times daily. 180 capsule 4  . primidone (MYSOLINE) 50 MG tablet Take 1 tablet (50 mg total) by mouth at bedtime. 90 tablet 4  . sertraline (ZOLOFT) 50 MG tablet Take 1 tablet (50 mg total) by mouth daily. 30 tablet 1  . traZODone (DESYREL) 100 MG tablet TAKE 1 & 1/2 (ONE & ONE-HALF) TABLETS BY MOUTH AT BEDTIME FOR SLEEP 135 tablet 0  . triamcinolone cream (KENALOG) 0.1 % Apply 1 application topically 2 (two) times daily. 30 g 0   No current facility-administered medications for this visit.     Musculoskeletal: Strength & Muscle Tone: UTA Gait & Station: UTA Patient leans: N/A  Psychiatric Specialty Exam: Review of Systems  Psychiatric/Behavioral: Positive for dysphoric mood.  All other systems reviewed and are negative.   Last menstrual period 11/26/2018.There is no height or weight on file to calculate BMI.  General Appearance: UTA  Eye Contact:  UTA  Speech:  Clear and  Coherent  Volume:  Normal  Mood:  Dysphoric  Affect:  UTA  Thought Process:  Goal Directed and Descriptions of Associations: Intact  Orientation:  Full (Time, Place, and Person)  Thought Content: Logical   Suicidal Thoughts:  No  Homicidal Thoughts:  No  Memory:  Immediate;   Fair Recent;   Fair Remote;   Fair  Judgement:  Fair  Insight:  Fair  Psychomotor Activity:  UTA  Concentration:  Concentration: Fair and Attention Span: Fair  Recall:  [employer] of Knowledge: Fair  Language: Fair  Akathisia:  No  Handed:  Right  AIMS (if indicated): UTA  Assets:  Communication Skills Desire for Improvement Housing Social Support  ADL's:  Intact  Cognition: WNL  Sleep:  Improving   Screenings: AIMS     Office Visit from 04/07/2018 in [place] Office Visit from 01/01/2018 in [facility] Total Score 10 1    GAD-7     Office Visit from 08/25/2019 in [facility] Visit from 12/16/2018 in [facility] Office Visit from 09/06/2017 in [facility]  Total GAD-7 Score $RemoveBef'8 8 8    '[id number]$ PHQ2-9     Office Visit from 12/15/2019 in [facility] Office Visit from 08/25/2019 in [facility]  Visit from 01/09/2019 in [facility] Office Visit from 12/30/2018 in [facility] Office Visit from 12/16/2018 in [facility]  PHQ-2 Total Score 2 6 0 0 3  PHQ-9 Total Score 12 17 0 2 13       Assessment and Plan: Willa Brocks is a 44 year old Caucasian female, has a history of bipolar disorder, PTSD, panic disorder, drug induced parkinsonian symptoms, diabetes melitis, polysubstance abuse was evaluated by phone today.  Patient is biologically predisposed given her history of trauma, multiple medical problems including mental health problems in her family, past history of substance abuse problems.  Patient currently reports  sexual dysfunction likely due to her psychotropic medication especially SSRI.  She is interested in starting Wellbutrin.  Plan as noted below.  Plan Bipolar disorder mixed with psychosis-unstable Lithium 300 mg p.o. daily in the morning and 450 mg p.o. daily with supper. Zoloft as prescribed. Zyprexa 20 mg p.o. nightly.  Panic attacks-stable Continue Zoloft to 50 mg p.o. daily  PTSD-stable Zoloft 50 mg p.o. daily.   Insomnia-some progress Trazodone 100 mg p.o. nightly Zyprexa 20 mg p.o. nightly  Drug-induced Parkinson's disease-stable Cogentin 1 mg p.o. 3 times daily.   Alcohol use disorder/cocaine and cannabis use disorder in remission Continue to monitor closely.  Sexual dysfunction likely SSRI induced-unstable Continue Zoloft to 50 mg p.o. daily. Patient is interested in starting Wellbutrin.  Discussed the risk of lowering seizure threshold in patients with seizure disorder.  She however reports that the last time she had a seizure was when she was a child.  She will monitor herself. Start Wellbutrin SR 150 mg p.o. daily for 1 week and increase to 150 mg p.o. twice daily after that. Provided medication education.  Encourage patient to get lithium levels done.  Lithium levels were ordered on 12/11/2019.  Follow-up in clinic in 2 to 3 weeks or sooner if needed.  I have spent atleast 20 minutes non face to face with patient today. More than 50 % of the time was spent for preparing to see the patient ( e.g., review of test, records ), ordering medications and test ,psychoeducation and supportive psychotherapy and care coordination,as well as documenting clinical information in electronic health record. This note was generated in part or whole with voice recognition software. Voice recognition is usually quite accurate but there are transcription errors that can and very often do occur. I apologize for any typographical errors that were not detected and corrected.      Ursula Alert, MD 12/23/2019, 8:26 AM

## 2019-12-23 ENCOUNTER — Other Ambulatory Visit: Payer: Medicare HMO

## 2019-12-23 DIAGNOSIS — R197 Diarrhea, unspecified: Secondary | ICD-10-CM | POA: Diagnosis not present

## 2019-12-23 DIAGNOSIS — R1013 Epigastric pain: Secondary | ICD-10-CM | POA: Diagnosis not present

## 2019-12-23 DIAGNOSIS — K529 Noninfective gastroenteritis and colitis, unspecified: Secondary | ICD-10-CM | POA: Diagnosis not present

## 2019-12-23 DIAGNOSIS — R5383 Other fatigue: Secondary | ICD-10-CM | POA: Diagnosis not present

## 2019-12-25 ENCOUNTER — Ambulatory Visit: Payer: Medicare HMO | Admitting: Nurse Practitioner

## 2019-12-25 ENCOUNTER — Other Ambulatory Visit: Payer: Self-pay | Admitting: Psychiatry

## 2019-12-25 DIAGNOSIS — F3130 Bipolar disorder, current episode depressed, mild or moderate severity, unspecified: Secondary | ICD-10-CM

## 2019-12-26 LAB — GI PROFILE, STOOL, PCR

## 2019-12-28 ENCOUNTER — Other Ambulatory Visit: Payer: Self-pay | Admitting: Family Medicine

## 2019-12-28 MED ORDER — METRONIDAZOLE 500 MG PO TABS: 500.0000 mg | ORAL_TABLET | Freq: Three times a day (TID) | ORAL | 0 refills | Status: AC

## 2019-12-29 ENCOUNTER — Ambulatory Visit: Payer: Medicare HMO

## 2020-01-03 ENCOUNTER — Other Ambulatory Visit: Payer: Self-pay | Admitting: Family Medicine

## 2020-01-04 ENCOUNTER — Ambulatory Visit: Payer: Medicare HMO | Admitting: Nurse Practitioner

## 2020-01-05 DIAGNOSIS — J9601 Acute respiratory failure with hypoxia: Secondary | ICD-10-CM | POA: Diagnosis not present

## 2020-01-05 DIAGNOSIS — J4541 Moderate persistent asthma with (acute) exacerbation: Secondary | ICD-10-CM | POA: Diagnosis not present

## 2020-01-05 DIAGNOSIS — C3400 Malignant neoplasm of unspecified main bronchus: Secondary | ICD-10-CM | POA: Diagnosis not present

## 2020-01-08 ENCOUNTER — Other Ambulatory Visit
Admission: RE | Admit: 2020-01-08 | Discharge: 2020-01-08 | Disposition: A | Discharge status: Home / Self Care | Payer: Medicare HMO | Attending: Psychiatry | Admitting: Psychiatry

## 2020-01-08 ENCOUNTER — Other Ambulatory Visit: Payer: Self-pay

## 2020-01-08 ENCOUNTER — Ambulatory Visit (INDEPENDENT_AMBULATORY_CARE_PROVIDER_SITE_OTHER): Payer: Medicare HMO

## 2020-01-08 VITALS — Ht 62.0 in | Wt 170.0 lb

## 2020-01-08 DIAGNOSIS — Z0000 Encounter for general adult medical examination without abnormal findings: Secondary | ICD-10-CM | POA: Diagnosis not present

## 2020-01-08 DIAGNOSIS — F315 Bipolar disorder, current episode depressed, severe, with psychotic features: Secondary | ICD-10-CM | POA: Insufficient documentation

## 2020-01-08 DIAGNOSIS — R69 Illness, unspecified: Secondary | ICD-10-CM | POA: Diagnosis not present

## 2020-01-08 LAB — LITHIUM LEVEL: Lithium Lvl: 0.58 mmol/L — ABNORMAL LOW (ref 0.60–1.20)

## 2020-01-08 NOTE — Patient Instructions (Signed)
Vanessa Baker , Thank you for taking time to come for your Medicare Wellness Visit. I appreciate your ongoing commitment to your health goals. Please review the following plan we discussed and let me know if I can assist you in the future.   Screening recommendations/referrals: Colonoscopy: n/a Mammogram: patient to schedule Bone Density: n/a Recommended yearly ophthalmology/optometry visit for glaucoma screening and checkup Recommended yearly dental visit for hygiene and checkup  Vaccinations: Influenza vaccine: completed 11/06/2019, due 09/05/2020 Pneumococcal vaccine: completed 11/06/2019 Tdap vaccine: completed 03/08/2017, due 03/09/2027 Shingles vaccine: n/a  Covid-19:  10/07/2019, 09/14/2019  Advanced directives: Advance directive discussed with you today.    Conditions/risks identified: none  Next appointment: Follow up in one year for your annual wellness visit.   Preventive Care 40-64 Years, Female Preventive care refers to lifestyle choices and visits with your health care provider that can promote health and wellness. What does preventive care include?  A yearly physical exam. This is also called an annual well check.  Dental exams once or twice a year.  Routine eye exams. Ask your health care provider how often you should have your eyes checked.  Personal lifestyle choices, including:  Daily care of your teeth and gums.  Regular physical activity.  Eating a healthy diet.  Avoiding tobacco and drug use.  Limiting alcohol use.  Practicing safe sex.  Taking low-dose aspirin daily starting at age 43.  Taking vitamin and mineral supplements as recommended by your health care provider. What happens during an annual well check? The services and screenings done by your health care provider during your annual well check will depend on your age, overall health, lifestyle risk factors, and family history of disease. Counseling  Your health care provider may ask you questions  about your:  Alcohol use.  Tobacco use.  Drug use.  Emotional well-being.  Home and relationship well-being.  Sexual activity.  Eating habits.  Work and work Statistician.  Method of birth control.  Menstrual cycle.  Pregnancy history. Screening  You may have the following tests or measurements:  Height, weight, and BMI.  Blood pressure.  Lipid and cholesterol levels. These may be checked every 5 years, or more frequently if you are over 38 years old.  Skin check.  Lung cancer screening. You may have this screening every year starting at age 26 if you have a 30-pack-year history of smoking and currently smoke or have quit within the past 15 years.  Fecal occult blood test (FOBT) of the stool. You may have this test every year starting at age 28.  Flexible sigmoidoscopy or colonoscopy. You may have a sigmoidoscopy every 5 years or a colonoscopy every 10 years starting at age 58.  Hepatitis C blood test.  Hepatitis B blood test.  Sexually transmitted disease (STD) testing.  Diabetes screening. This is done by checking your blood sugar (glucose) after you have not eaten for a while (fasting). You may have this done every 1-3 years.  Mammogram. This may be done every 1-2 years. Talk to your health care provider about when you should start having regular mammograms. This may depend on whether you have a family history of breast cancer.  BRCA-related cancer screening. This may be done if you have a family history of breast, ovarian, tubal, or peritoneal cancers.  Pelvic exam and Pap test. This may be done every 3 years starting at age 83. Starting at age 38, this may be done every 5 years if you have a Pap test in  combination with an HPV test.  Bone density scan. This is done to screen for osteoporosis. You may have this scan if you are at high risk for osteoporosis. Discuss your test results, treatment options, and if necessary, the need for more tests with your  health care provider. Vaccines  Your health care provider may recommend certain vaccines, such as:  Influenza vaccine. This is recommended every year.  Tetanus, diphtheria, and acellular pertussis (Tdap, Td) vaccine. You may need a Td booster every 10 years.  Zoster vaccine. You may need this after age 75.  Pneumococcal 13-valent conjugate (PCV13) vaccine. You may need this if you have certain conditions and were not previously vaccinated.  Pneumococcal polysaccharide (PPSV23) vaccine. You may need one or two doses if you smoke cigarettes or if you have certain conditions. Talk to your health care provider about which screenings and vaccines you need and how often you need them. This information is not intended to replace advice given to you by your health care provider. Make sure you discuss any questions you have with your health care provider. Document Released: 02/18/2015 Document Revised: 10/12/2015 Document Reviewed: 11/23/2014 Elsevier Interactive Patient Education  2017 [place] Prevention in the Home Falls can cause injuries. They can happen to people of all ages. There are many things you can do to make your home safe and to help prevent falls. What can I do on the outside of my home?  Regularly fix the edges of walkways and driveways and fix any cracks.  Remove anything that might make you trip as you walk through a door, such as a raised step or threshold.  Trim any bushes or trees on the path to your home.  Use bright outdoor lighting.  Clear any walking paths of anything that might make someone trip, such as rocks or tools.  Regularly check to see if handrails are loose or broken. Make sure that both sides of any steps have handrails.  Any raised decks and porches should have guardrails on the edges.  Have any leaves, snow, or ice cleared regularly.  Use sand or salt on walking paths during winter.  Clean up any spills in your garage right away.  This includes oil or grease spills. What can I do in the bathroom?  Use night lights.  Install grab bars by the toilet and in the tub and shower. Do not use towel bars as grab bars.  Use non-skid mats or decals in the tub or shower.  If you need to sit down in the shower, use a plastic, non-slip stool.  Keep the floor dry. Clean up any water that spills on the floor as soon as it happens.  Remove soap buildup in the tub or shower regularly.  Attach bath mats securely with double-sided non-slip rug tape.  Do not have throw rugs and other things on the floor that can make you trip. What can I do in the bedroom?  Use night lights.  Make sure that you have a light by your bed that is easy to reach.  Do not use any sheets or blankets that are too big for your bed. They should not hang down onto the floor.  Have a firm chair that has side arms. You can use this for support while you get dressed.  Do not have throw rugs and other things on the floor that can make you trip. What can I do in the kitchen?  Clean up any spills  right away.  Avoid walking on wet floors.  Keep items that you use a lot in easy-to-reach places.  If you need to reach something above you, use a strong step stool that has a grab bar.  Keep electrical cords out of the way.  Do not use floor polish or wax that makes floors slippery. If you must use wax, use non-skid floor wax.  Do not have throw rugs and other things on the floor that can make you trip. What can I do with my stairs?  Do not leave any items on the stairs.  Make sure that there are handrails on both sides of the stairs and use them. Fix handrails that are broken or loose. Make sure that handrails are as long as the stairways.  Check any carpeting to make sure that it is firmly attached to the stairs. Fix any carpet that is loose or worn.  Avoid having throw rugs at the top or bottom of the stairs. If you do have throw rugs, attach them  to the floor with carpet tape.  Make sure that you have a light switch at the top of the stairs and the bottom of the stairs. If you do not have them, ask someone to add them for you. What else can I do to help prevent falls?  Wear shoes that:  Do not have high heels.  Have rubber bottoms.  Are comfortable and fit you well.  Are closed at the toe. Do not wear sandals.  If you use a stepladder:  Make sure that it is fully opened. Do not climb a closed stepladder.  Make sure that both sides of the stepladder are locked into place.  Ask someone to hold it for you, if possible.  Clearly mark and make sure that you can see:  Any grab bars or handrails.  First and last steps.  Where the edge of each step is.  Use tools that help you move around (mobility aids) if they are needed. These include:  Canes.  Walkers.  Scooters.  Crutches.  Turn on the lights when you go into a dark area. Replace any light bulbs as soon as they burn out.  Set up your furniture so you have a clear path. Avoid moving your furniture around.  If any of your floors are uneven, fix them.  If there are any pets around you, be aware of where they are.  Review your medicines with your doctor. Some medicines can make you feel dizzy. This can increase your chance of falling. Ask your doctor what other things that you can do to help prevent falls. This information is not intended to replace advice given to you by your health care provider. Make sure you discuss any questions you have with your health care provider. Document Released: 11/18/2008 Document Revised: 06/30/2015 Document Reviewed: 02/26/2014 Elsevier Interactive Patient Education  2017 [employer].
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
## 2020-01-12 ENCOUNTER — Telehealth: Payer: Self-pay

## 2020-01-12 NOTE — Telephone Encounter (Signed)
    MA12/08/2019 1st Attempt  Name: Vanessa Baker   MRN: [id number]   DOB: [birth year]   AGE: 44 y.o.   GENDER: female   PCP Venita Lick, NP.   01/12/20 Left message on voicemail for patient to return my call regarding assistance with utility bills. Will call patient again this week.    Tashena Ibach, AAS Paralegal, [place] . Embedded Care Coordination [facility]  Care Management  [address], [place] [address] [email or web address]  [phone]   [email or web address]

## 2020-01-13 ENCOUNTER — Telehealth: Payer: Medicare HMO | Admitting: Psychiatry

## 2020-01-13 ENCOUNTER — Other Ambulatory Visit: Payer: Self-pay | Admitting: Nurse Practitioner

## 2020-01-13 ENCOUNTER — Encounter: Payer: Self-pay | Admitting: Nurse Practitioner

## 2020-01-13 ENCOUNTER — Telehealth: Payer: Self-pay | Admitting: Gastroenterology

## 2020-01-13 ENCOUNTER — Ambulatory Visit (INDEPENDENT_AMBULATORY_CARE_PROVIDER_SITE_OTHER): Payer: Medicare HMO | Admitting: Nurse Practitioner

## 2020-01-13 ENCOUNTER — Other Ambulatory Visit: Payer: Self-pay

## 2020-01-13 VITALS — BP 115/72 | HR 97 | Temp 98.7°F | Ht 62.0 in | Wt 174.2 lb

## 2020-01-13 DIAGNOSIS — E785 Hyperlipidemia, unspecified: Secondary | ICD-10-CM

## 2020-01-13 DIAGNOSIS — G2581 Restless legs syndrome: Secondary | ICD-10-CM

## 2020-01-13 DIAGNOSIS — R42 Dizziness and giddiness: Secondary | ICD-10-CM | POA: Insufficient documentation

## 2020-01-13 DIAGNOSIS — R413 Other amnesia: Secondary | ICD-10-CM | POA: Diagnosis not present

## 2020-01-13 DIAGNOSIS — R1310 Dysphagia, unspecified: Secondary | ICD-10-CM | POA: Insufficient documentation

## 2020-01-13 MED ORDER — ATORVASTATIN CALCIUM 40 MG PO TABS: 40.0000 mg | ORAL_TABLET | Freq: Every day | ORAL | 4 refills | Status: DC

## 2020-01-13 MED ORDER — OMEPRAZOLE 20 MG PO CPDR: 20.0000 mg | DELAYED_RELEASE_CAPSULE | Freq: Every day | ORAL | 3 refills | Status: DC

## 2020-01-13 MED ORDER — TRIAMCINOLONE ACETONIDE 0.1 % EX CREA
1.0000 | TOPICAL_CREAM | Freq: Two times a day (BID) | CUTANEOUS | 0 refills | Status: DC
Start: 2020-01-13 — End: 2020-02-25

## 2020-01-13 MED ORDER — BACLOFEN 10 MG PO TABS: 10.0000 mg | ORAL_TABLET | Freq: Three times a day (TID) | ORAL | 4 refills | Status: DC | PRN

## 2020-01-13 NOTE — Assessment & Plan Note (Signed)
Acute x 3 days with memory changes, ongoing tremor, and dysphagia.  6CIT -- 18 today.  Significant memory changes present.  Will obtain urgent MRI and place urgent referral to neuro for further evaluation due to current symptoms.  Overall neuro exam no red flags, with exception of memory changes and bilateral tremor upper.

## 2020-01-13 NOTE — Assessment & Plan Note (Signed)
New onset with dizziness and memory changes.  Noted with solids.  Labs today.  Will change from Nexium to Prilosec.  Urgent referral to GI placed for further evaluation and will obtain MRI brain.

## 2020-01-13 NOTE — Progress Notes (Signed)
BP 115/72   Pulse 97   Temp 98.7 F (37.1 C) (Oral)   Ht _0  (1.575 m)   Wt 174 lb 3.2 oz (79 kg)   LMP 11/26/2018 Comment: pt denies pregnancy-states no period in two years  SpO2 98%   BMI 31.86 kg/m    Subjective:    Patient ID: Vanessa Baker, female    DOB: 12/18/1975, 44 y.o.   MRN: [id number]  HPI: Vanessa Baker is a 44 y.o. female  Chief Complaint  Patient presents with  . Dizziness    all has been for the past 3 to 4 days.  . Memory Loss    cant seem to write or understand what is written  . Choking  . dropping stuff  . Shaking   DIZZINESS Started with dizziness past 3-4 days, prior to this was having memory changes for several months -- fell yesterday, legs gave out.  States she has had feeling of shaking and balance issues for 6 months.  Reports checking blood sugar at home and no levels <70, has underlying T2DM.   Underlying mental health disorder, takes Lithium. Her husband has noticed memory changes, significantly over past few months.  Reports tremors bilaterally upper extremities for 3-4 years.  Takes Primidone daily. Duration: days Description of symptoms: off kilter , room spinning Duration of episode: minutes Dizziness frequency: recurrent Provoking factors: none -- no warning at all Aggravating factors:  none Triggered by rolling over in bed: no Triggered by bending over: no Aggravated by head movement: no Aggravated by exertion, coughing, loud noises: no Recent head injury: has concussion as a young child Recent or current viral symptoms: no History of vasovagal episodes: no Nausea: yes Vomiting: no Tinnitus: no Hearing loss: no Aural fullness: no Headache: occasional Photophobia/phonophobia: no Unsteady gait: yes Postural instability: no Diplopia, dysarthria, dysphagia or weakness: yes Related to exertion: no Pallor: no Diaphoresis: no Dyspnea: no Chest pain: no  6CIT Screen 01/13/2020 01/08/2020 11/19/2017  What  Year? 0 points 0 points 0 points  What month? 0 points 0 points 0 points  What time? 0 points 3 points 0 points  Count back from 20 4 points 2 points 0 points  Months in reverse 4 points 4 points 0 points  Repeat phrase 10 points 8 points 0 points  Total Score 18 17 0   DYSPHAGIA This started 3 days ago, reports can not swallow meats.  Has no urge to eat due to this.  Takes Nexium daily. Duration: days Description of symptom: Onset: Immediately upon swallowing Location of dysphagia: throat Dysphagia to solids only: yes Dysphagia to solids & liquids: no  Frequency:intermittent  Progressively getting worse: no Alleviatiating factors: nothing Provoking factors: certain foods Status: fluctuating EGD: yes Weight loss: no Sensation of lump in throat: yes Heartburn: a little Odynophagia: no Nausea: yes Vomiting: no Drooling/nasal regurgitation/food spillage: no Coughing/choking/dysphonia: yes Dysarthria: no Hematemesis: no Regurgitation of undigested food/halitosis: no Chest pain: no  Relevant past medical, surgical, family and social history reviewed and updated as indicated. Interim medical history since our last visit reviewed. Allergies and medications reviewed and updated.  Review of Systems  Constitutional: Positive for appetite change and fatigue. Negative for activity change, diaphoresis and fever.  Respiratory: Negative for cough, chest tightness, shortness of breath and wheezing.   Cardiovascular: Negative for chest pain, palpitations and leg swelling.  Gastrointestinal: Positive for nausea. Negative for abdominal distention, abdominal pain, anal bleeding, blood in stool, constipation, diarrhea and vomiting.  Endocrine:  Negative for cold intolerance, heat intolerance, polydipsia, polyphagia and polyuria.  Neurological: Positive for dizziness, tremors, weakness and headaches. Negative for syncope, light-headedness and numbness.  Psychiatric/Behavioral: Positive for  decreased concentration. Negative for self-injury, sleep disturbance and suicidal ideas.    Per HPI unless specifically indicated above     Objective:    BP 115/72   Pulse 97   Temp 98.7 F (37.1 C) (Oral)   Ht _0  (1.575 m)   Wt 174 lb 3.2 oz (79 kg)   LMP 11/26/2018 Comment: pt denies pregnancy-states no period in two years  SpO2 98%   BMI 31.86 kg/m   Wt Readings from Last 3 Encounters:  01/13/20 174 lb 3.2 oz (79 kg)  01/08/20 170 lb (77.1 kg)  12/15/19 169 lb 9.6 oz (76.9 kg)    Physical Exam Vitals and nursing note reviewed.  Constitutional:      General: She is awake. She is not in acute distress.    Appearance: She is well-developed and well-groomed. She is obese. She is not ill-appearing.  HENT:     Head: Normocephalic.     Right Ear: Hearing, tympanic membrane, ear canal and external ear normal.     Left Ear: Hearing, tympanic membrane, ear canal and external ear normal.     Nose: Nose normal.     Mouth/Throat:     Mouth: Mucous membranes are moist.  Eyes:     General: Lids are normal.        Right eye: No discharge.        Left eye: No discharge.     Extraocular Movements: Extraocular movements intact.     Conjunctiva/sclera: Conjunctivae normal.     Pupils: Pupils are equal, round, and reactive to light.     Visual Fields: Right eye visual fields normal.  Neck:     Thyroid: No thyromegaly.     Vascular: No carotid bruit.  Cardiovascular:     Rate and Rhythm: Normal rate and regular rhythm.     Heart sounds: Normal heart sounds. No murmur heard.  No gallop.   Pulmonary:     Effort: Pulmonary effort is normal. No accessory muscle usage or respiratory distress.     Breath sounds: Normal breath sounds.  Abdominal:     General: Bowel sounds are normal.     Palpations: Abdomen is soft. There is no hepatomegaly or splenomegaly.     Tenderness: There is no abdominal tenderness.     Comments: No dysphagia noted with swallowing of liquids on exam.   Musculoskeletal:     Cervical back: Normal range of motion and neck supple.     Right lower leg: No edema.     Left lower leg: No edema.  Lymphadenopathy:     Cervical: No cervical adenopathy.  Skin:    General: Skin is warm and dry.  Neurological:     Mental Status: She is alert.     Cranial Nerves: Cranial nerves are intact.     Sensory: Sensation is intact.     Motor: Weakness (upper bilateral) and tremor (upper bilateral) present.     Coordination: Finger-Nose-Finger Test abnormal (tremor bilaterall, slow).     Gait: Gait is intact.     Deep Tendon Reflexes: Reflexes are normal and symmetric.     Reflex Scores:      Brachioradialis reflexes are 2+ on the right side and 2+ on the left side.      Patellar reflexes are 2+ on the right  side and 2+ on the left side.    Comments: Oriented to person, place, and time -- but difficulty present with repeating instructions and remembering sentences provider stated.  Unable to recall months backwards and and numbers.    Psychiatric:        Attention and Perception: Attention normal.        Mood and Affect: Mood normal.        Speech: Speech normal.        Behavior: Behavior normal. Behavior is cooperative.        Thought Content: Thought content normal.     Results for orders placed or performed during the hospital encounter of 01/08/20  Lithium level  Result Value Ref Range   Lithium Lvl 0.58 (L) 0.60 - 1.20 mmol/L      Assessment & Plan:   Problem List Items Addressed This Visit      Digestive   Dysphagia    New onset with dizziness and memory changes.  Noted with solids.  Labs today.  Will change from Nexium to Prilosec.  Urgent referral to GI placed for further evaluation and will obtain MRI brain.      Relevant Orders   TSH   MR Brain W Wo Contrast   Ambulatory referral to Neurology   Ambulatory referral to Gastroenterology     Other   Restless leg syndrome   Relevant Medications   baclofen (LIORESAL) 10 MG tablet    Dizziness    Acute x 3 days with memory changes, ongoing tremor, and dysphagia.  6CIT -- 18 today.  Significant memory changes present.  Will obtain urgent MRI and place urgent referral to neuro for further evaluation due to current symptoms.  Overall neuro exam no red flags, with exception of memory changes and bilateral tremor upper.        Relevant Orders   TSH   Vitamin B12   RPR   HIV Antibody (routine testing w rflx)   Lithium level   Sed Rate (ESR)   C-reactive protein   Comprehensive metabolic panel   Magnesium   MR Brain W Wo Contrast   Ambulatory referral to Neurology   Ambulatory referral to Gastroenterology   Memory changes - Primary    Acute x 3 days with memory changes, ongoing tremor, and dysphagia.  6CIT -- 18 today.  Significant memory changes present.  Will obtain urgent MRI and place urgent referral to neuro for further evaluation due to current symptoms.  Overall neuro exam no red flags, with exception of memory changes and bilateral tremor upper.        Relevant Orders   Vitamin B12   RPR   HIV Antibody (routine testing w rflx)   Lithium level   Sed Rate (ESR)   C-reactive protein   Comprehensive metabolic panel   Magnesium   MR Brain W Wo Contrast   Ambulatory referral to Neurology    Other Visit Diagnoses    Hyperlipidemia, unspecified hyperlipidemia type       Refills in   Relevant Medications   atorvastatin (LIPITOR) 40 MG tablet       Follow up plan: Return in about 2 weeks (around 01/27/2020) for Memory changes.

## 2020-01-13 NOTE — Telephone Encounter (Signed)
error 

## 2020-01-13 NOTE — Patient Instructions (Signed)
Changing Nexium to Omeprazole --- STOP NEXIUM   Memory Compensation Strategies  1. Use "WARM" strategy.  W= write it down  A= associate it  R= repeat it  M= make a mental note  2.   You can keep a Social worker.  Use a 3-ring notebook with sections for the following: calendar, important names and phone numbers,  medications, doctors' names/phone numbers, lists/reminders, and a section to journal what you did  each day.   3.    Use a calendar to write appointments down.  4.    Write yourself a schedule for the day.  This can be placed on the calendar or in a separate section of the Memory Notebook.  Keeping a  regular schedule can help memory.  5.    Use medication organizer with sections for each day or morning/evening pills.  You may need help loading it  6.    Keep a basket, or pegboard by the door.  Place items that you need to take out with you in the basket or on the pegboard.  You may also want to  include a message board for reminders.  7.    Use sticky notes.  Place sticky notes with reminders in a place where the task is performed.  For example: " turn off the  stove" placed by the stove, "lock the door" placed on the door at eye level, " take your medications" on  the bathroom mirror or by the place where you normally take your medications.  8.    Use alarms/timers.  Use while cooking to remind yourself to check on food or as a reminder to take your medicine, or as a  reminder to make a call, or as a reminder to perform another task, etc.

## 2020-01-14 ENCOUNTER — Telehealth: Payer: Self-pay | Admitting: Psychiatry

## 2020-01-14 ENCOUNTER — Other Ambulatory Visit: Payer: Self-pay

## 2020-01-14 ENCOUNTER — Emergency Department
Admission: RE | Admit: 2020-01-14 | Discharge: 2020-01-14 | Disposition: A | Discharge status: Home / Self Care | Payer: Medicare HMO | Source: Ambulatory Visit | Attending: Nurse Practitioner | Admitting: Nurse Practitioner

## 2020-01-14 ENCOUNTER — Emergency Department
Admission: EM | Admit: 2020-01-14 | Discharge: 2020-01-14 | Disposition: A | Discharge status: Home / Self Care | Payer: Medicare HMO | Attending: Emergency Medicine | Admitting: Emergency Medicine

## 2020-01-14 ENCOUNTER — Encounter: Payer: Self-pay | Admitting: Emergency Medicine

## 2020-01-14 DIAGNOSIS — Z79899 Other long term (current) drug therapy: Secondary | ICD-10-CM | POA: Diagnosis not present

## 2020-01-14 DIAGNOSIS — H748X3 Other specified disorders of middle ear and mastoid, bilateral: Secondary | ICD-10-CM | POA: Diagnosis not present

## 2020-01-14 DIAGNOSIS — R251 Tremor, unspecified: Secondary | ICD-10-CM | POA: Insufficient documentation

## 2020-01-14 DIAGNOSIS — Z7984 Long term (current) use of oral hypoglycemic drugs: Secondary | ICD-10-CM | POA: Insufficient documentation

## 2020-01-14 DIAGNOSIS — T43221A Poisoning by selective serotonin reuptake inhibitors, accidental (unintentional), initial encounter: Secondary | ICD-10-CM | POA: Insufficient documentation

## 2020-01-14 DIAGNOSIS — E119 Type 2 diabetes mellitus without complications: Secondary | ICD-10-CM | POA: Diagnosis not present

## 2020-01-14 DIAGNOSIS — G2579 Other drug induced movement disorders: Secondary | ICD-10-CM

## 2020-01-14 DIAGNOSIS — J45909 Unspecified asthma, uncomplicated: Secondary | ICD-10-CM | POA: Diagnosis not present

## 2020-01-14 DIAGNOSIS — Z87891 Personal history of nicotine dependence: Secondary | ICD-10-CM | POA: Insufficient documentation

## 2020-01-14 DIAGNOSIS — R799 Abnormal finding of blood chemistry, unspecified: Secondary | ICD-10-CM | POA: Diagnosis present

## 2020-01-14 DIAGNOSIS — R42 Dizziness and giddiness: Secondary | ICD-10-CM | POA: Insufficient documentation

## 2020-01-14 DIAGNOSIS — R413 Other amnesia: Secondary | ICD-10-CM | POA: Insufficient documentation

## 2020-01-14 DIAGNOSIS — I10 Essential (primary) hypertension: Secondary | ICD-10-CM | POA: Insufficient documentation

## 2020-01-14 DIAGNOSIS — R1310 Dysphagia, unspecified: Secondary | ICD-10-CM | POA: Insufficient documentation

## 2020-01-14 DIAGNOSIS — G252 Other specified forms of tremor: Secondary | ICD-10-CM | POA: Diagnosis not present

## 2020-01-14 LAB — CBC
HCT: 41.7 % (ref 36.0–46.0)
Hemoglobin: 13.4 g/dL (ref 12.0–15.0)
MCH: 28.6 pg (ref 26.0–34.0)
MCHC: 32.1 g/dL (ref 30.0–36.0)
MCV: 89.1 fL (ref 80.0–100.0)
Platelets: 330 10*3/uL (ref 150–400)
RBC: 4.68 MIL/uL (ref 3.87–5.11)
RDW: 13.1 % (ref 11.5–15.5)
WBC: 10.3 10*3/uL (ref 4.0–10.5)
nRBC: 0 % (ref 0.0–0.2)

## 2020-01-14 LAB — COMPREHENSIVE METABOLIC PANEL
ALT: 18 U/L (ref 0–44)
AST: 27 U/L (ref 15–41)
Albumin: 4 g/dL (ref 3.5–5.0)
Alkaline Phosphatase: 64 U/L (ref 38–126)
Anion gap: 12 (ref 5–15)
BUN: 7 mg/dL (ref 6–20)
CO2: 21 mmol/L — ABNORMAL LOW (ref 22–32)
Calcium: 9.2 mg/dL (ref 8.9–10.3)
Chloride: 102 mmol/L (ref 98–111)
Creatinine, Ser: 0.74 mg/dL (ref 0.44–1.00)
GFR, Estimated: >60 mL/min (ref >60)
Glucose, Bld: 117 mg/dL — ABNORMAL HIGH (ref 70–99)
Potassium: 4.1 mmol/L (ref 3.5–5.1)
Sodium: 135 mmol/L (ref 135–145)
Total Bilirubin: 0.6 mg/dL (ref 0.3–1.2)
Total Protein: 7 g/dL (ref 6.5–8.1)

## 2020-01-14 LAB — ACETAMINOPHEN LEVEL: Acetaminophen (Tylenol), Serum: <10 ug/mL — ABNORMAL LOW (ref 10–30)

## 2020-01-14 LAB — SALICYLATE LEVEL: Salicylate Lvl: <7 mg/dL — ABNORMAL LOW (ref 7.0–30.0)

## 2020-01-14 LAB — LITHIUM LEVEL: Lithium Lvl: 0.83 mmol/L (ref 0.60–1.20)

## 2020-01-14 MED ORDER — SODIUM CHLORIDE 0.9 % IV BOLUS
1000.0000 mL | Freq: Once | INTRAVENOUS | Status: AC
  Administered 2020-01-14: 1000 mL via INTRAVENOUS

## 2020-01-14 MED ORDER — LORAZEPAM 2 MG/ML IJ SOLN
1.0000 mg | Freq: Once | INTRAMUSCULAR | Status: AC
  Administered 2020-01-14: 1 mg via INTRAVENOUS
  Filled 2020-01-14: qty 1

## 2020-01-14 MED ORDER — GADOBUTROL 1 MMOL/ML IV SOLN
7.5000 mL | Freq: Once | INTRAVENOUS | Status: AC | PRN
  Administered 2020-01-14: 7.5 mL via INTRAVENOUS

## 2020-01-14 NOTE — ED Provider Notes (Signed)
[facility] Emergency Department Provider Note   ____________________________________________   Event Date/Time   First MD Initiated Contact with Patient 01/14/20 1642     (approximate)  I have reviewed the triage vital signs and the nursing notes.   HISTORY  Chief Complaint Abnormal Lab    HPI Vanessa Baker is a 44 y.o. female with past medical history of hypertension, hyperlipidemia, diabetes, migraines, and bipolar disorder who presents to the ED for abnormal labs.  Patient states she has been experiencing these increasing tremors, agitation, and memory difficulties for the past 3 to 4 days.  She saw her PCP for this problem recently and was referred to MRI, which was done earlier today.  She received a call from her psychiatrist today stating that her lithium levels were high and she needed to be evaluated in the ED.  Patient denies any fevers, cough, chest pain, shortness of breath, dysuria, or hematuria.  She states that her lithium dose was recently increased and she also had Wellbutrin added to her medication regimen.  She denies any alcohol or drug abuse.        Past Medical History:  Diagnosis Date  . Allergy   . Anxiety   . Asthma   . Bipolar disorder ([place])   . Bursitis   . Depression   . Diabetes mellitus without complication ([place])   . Essential hypertension 03/28/2017  . Frequent headaches   . GERD (gastroesophageal reflux disease)   . Headache   . High cholesterol   . Hypertension   . Irritable bowel syndrome (IBS)   . Irritable colon 02/23/2010  . Migraine 04/02/2017  . Obesity (BMI 30-39.9) 11/25/2013  . Seizure ([place]) [phone]  . Seizures ([place])   . Tremor     Patient Active Problem List   Diagnosis Date Noted  . Dizziness 01/13/2020  . Memory changes 01/13/2020  . Dysphagia 01/13/2020  . Sexual dysfunction, unspecified 12/22/2019  . Vitamin B12 deficiency 12/16/2019  . Gastroesophageal reflux disease 12/15/2019  .  Bipolar disorder, in full remission, most recent episode depressed ([place]) 12/11/2019  . Severe tobacco use disorder, in early remission 12/11/2019  . Frequent loose stools 12/08/2019  . Rash 12/08/2019  . Financial difficulties 08/25/2019  . Obesity 08/25/2019  . S/P laparoscopic cholecystectomy 04/29/2019  . Lymphocytic colitis 03/19/2019  . Cocaine use disorder, moderate, in sustained remission ([place]) 03/16/2019  . Chronic hip pain, bilateral 01/09/2019  . Type 2 diabetes mellitus with obesity ([place]) 12/23/2018  . Restless leg syndrome 11/17/2018  . Cannabis use disorder, mild, in early remission 08/06/2018  . Alcohol use disorder, mild, in early remission 08/06/2018  . Neuroleptic induced parkinsonism ([place]) 08/06/2018  . PTSD (post-traumatic stress disorder) 08/06/2018  . Migraine 04/02/2017  . Hypertension associated with diabetes ([place]) 03/28/2017  . Moderate persistent asthma without complication 83/10/4074  . Hyperlipidemia associated with type 2 diabetes mellitus ([place]) 05/17/2015  . Abnormal Pap smear of cervix 05/17/2015  . Constipation 04/19/2015  . Insomnia 03/31/2015  . Regular astigmatism 05/05/2010    Past Surgical History:  Procedure Laterality Date  . CHOLECYSTECTOMY N/A 04/27/2019   Procedure: LAPAROSCOPIC CHOLECYSTECTOMY;  Surgeon: Fredirick Maudlin, MD;  Location: [facility] ORS;  Service: General;  Laterality: N/A;  . NO PAST SURGERIES      Prior to Admission medications   Medication Sig Start Date End Date Taking? Authorizing Provider  ACCU-CHEK AVIVA PLUS test strip USE 1 STRIP TO CHECK GLUCOSE UP TO 3 TIMES DAILY AS DIRECTED. 12/23/18  Olin Hauser, DO  Accu-Chek Softclix Lancets lancets Use to check blood sugar up to 3 times daily as instructed 12/23/18   Parks Ranger, Devonne Doughty, DO  albuterol (VENTOLIN HFA) 108 (90 Base) MCG/ACT inhaler Inhale 2 puffs into the lungs every 6 (six) hours as needed for wheezing or shortness of breath. 12/15/19   Cannady,  Henrine Screws T, NP  amantadine (SYMMETREL) 100 MG capsule Take by mouth at bedtime. 12/09/19   [provider]  atorvastatin (LIPITOR) 40 MG tablet Take 1 tablet (40 mg total) by mouth daily. 01/13/20   Cannady, Henrine Screws T, NP  baclofen (LIORESAL) 10 MG tablet Take 1 tablet (10 mg total) by mouth 3 (three) times daily as needed for muscle spasms. 01/13/20   Cannady, Henrine Screws T, NP  benztropine (COGENTIN) 1 MG tablet Take 1 tablet (1 mg total) by mouth 3 (three) times daily. 12/11/19   Ursula Alert, MD  blood glucose meter kit and supplies Dispense based on patient and insurance preference. Use up to four times daily as directed. (FOR ICD-10 E10.9, E11.9). 09/12/17   Mikey College, NP  Blood Glucose Monitoring Suppl (ACCU-CHEK AVIVA PLUS) w/Device KIT Use to check blood sugar up to 3 times daily 12/23/18   Parks Ranger, Devonne Doughty, DO  buPROPion [facility] SR) 150 MG 12 hr tablet Take 1 tablet (150 mg total) by mouth as directed. Start taking 1 tablet daily in the AM for 1 week and increase to 1 tablet in the AM and PM after a week 12/22/19   Ursula Alert, MD  Calcium Carb-Cholecalciferol (CALCIUM 600+D3 PO) Take 1 tablet by mouth daily.    [provider]  cetirizine (ZYRTEC) 10 MG tablet Take 1 tablet (10 mg total) by mouth daily as needed for allergies (itching). 12/08/19   Myles Gip, DO  Dulaglutide (TRULICITY) 1.5 VB/1.6OM SOPN Inject 1.5 mg into the skin every 7 (seven) days. 12/10/19   Myles Gip, DO  fluticasone-salmeterol (ADVAIR HFA) 600-45 MCG/ACT inhaler Inhale 2 puffs into the lungs 2 (two) times daily. 12/15/19   Cannady, Henrine Screws T, NP  gabapentin (NEURONTIN) 300 MG capsule Take 1 capsule (300 mg total) by mouth 2 (two) times daily. For restless legs 08/25/19   Cannady, Henrine Screws T, NP  lisinopril (ZESTRIL) 10 MG tablet Take 1 tablet (10 mg total) by mouth daily. 12/10/19   Myles Gip, DO  metFORMIN (GLUCOPHAGE) 500 MG tablet Take 1 tablet (500 mg total) by  mouth 2 (two) times daily. 12/10/19   Myles Gip, DO  Multiple Vitamins-Minerals (MULTIVITAMIN ADULT EXTRA C) CHEW Chew by mouth. 02/09/03   [provider]  norethindrone-ethinyl estradiol (JUNEL FE 1/20) 1-20 MG-MCG tablet Take 1 tablet by mouth daily. 04/22/19   Karamalegos, Alexander J, DO  OLANZapine (ZYPREXA) 20 MG tablet Take 1 tablet (20 mg total) by mouth at bedtime. 12/11/19   Ursula Alert, MD  omega-3 acid ethyl esters (LOVAZA) 1 g capsule Take 1 capsule (1 g total) by mouth 2 (two) times daily. 12/15/19   Cannady, Henrine Screws T, NP  omeprazole (PRILOSEC) 20 MG capsule Take 1 capsule (20 mg total) by mouth daily. 01/13/20   Cannady, Henrine Screws T, NP  primidone (MYSOLINE) 50 MG tablet Take 1 tablet (50 mg total) by mouth at bedtime. 12/15/19   Cannady, Henrine Screws T, NP  sertraline (ZOLOFT) 50 MG tablet Take 1 tablet (50 mg total) by mouth daily. 12/22/19   Ursula Alert, MD  tiZANidine (ZANAFLEX) 4 MG tablet  11/03/18   [provider]  traZODone (DESYREL) 100 MG tablet TAKE 1 & 1/2 (ONE & ONE-HALF) TABLETS BY MOUTH AT BEDTIME FOR SLEEP 11/24/19   Ursula Alert, MD  triamcinolone (KENALOG) 0.1 % Apply 1 application topically 2 (two) times daily. 01/13/20   Marnee Guarneri T, NP    Allergies Amitriptyline  Family History  Problem Relation Age of Onset  . Bipolar disorder Mother   . Schizophrenia Mother   . Hypertension Mother   . Diabetes Mother   . Cancer Mother   . Anxiety disorder Mother   . ADD / ADHD Mother   . Alcohol abuse Mother   . Drug abuse Mother   . Heart disease Mother   . Bipolar disorder Sister   . Anxiety disorder Sister   . Drug abuse Sister   . Bipolar disorder Brother   . Anxiety disorder Brother   . Drug abuse Brother   . Hypertension Father   . Cancer Father   . Diabetes Father   . Breast cancer Maternal Aunt   . Leukemia Maternal Aunt   . Breast cancer Paternal Aunt   . Breast cancer Maternal Grandmother   . Alcohol abuse Maternal  Grandmother   . Bipolar disorder Maternal Grandmother   . Breast cancer Paternal Grandmother   . Parkinson's disease Paternal Grandmother   . Alcohol abuse Maternal Grandfather   . Prostate cancer Maternal Grandfather   . Alcohol abuse Maternal Uncle   . Multiple myeloma Maternal Uncle   . ADD / ADHD Son   . Throat cancer Maternal Aunt   . Tremor Neg Hx     Social History Social History   Tobacco Use  . Smoking status: Former Smoker    Packs/day: 0.50    Years: 27.00    Pack years: 13.50    Types: Cigarettes    Quit date: 04/26/2019    Years since quitting: 0.7  . Smokeless tobacco: Never Used  Vaping Use  . Vaping Use: Former  Substance Use Topics  . Alcohol use: Yes  . Drug use: No    Review of Systems  Constitutional: No fever/chills Eyes: No visual changes. ENT: No sore throat. Cardiovascular: Denies chest pain. Respiratory: Denies shortness of breath. Gastrointestinal: No abdominal pain.  No nausea, no vomiting.  No diarrhea.  No constipation. Genitourinary: Negative for dysuria. Musculoskeletal: Negative for back pain. Skin: Negative for rash. Neurological: Negative for headaches, focal weakness or numbness.  Positive for tremors and memory difficulties.  ____________________________________________   PHYSICAL EXAM:  VITAL SIGNS: ED Triage Vitals  Enc Vitals Group     BP 01/14/20 1524 104/68     Pulse Rate 01/14/20 1524 92     Resp 01/14/20 1524 15     Temp 01/14/20 1524 99.1 F (37.3 C)     Temp Source 01/14/20 1524 Oral     SpO2 01/14/20 1524 98 %     Weight 01/14/20 1525 170 lb (77.1 kg)     Height 01/14/20 1525 _0  (1.575 m)     Head Circumference --      Peak Flow --      Pain Score 01/14/20 1525 0     Pain Loc --      Pain Edu? --      Excl. in [place]? --     Constitutional: Alert and oriented to person, place, time, and situation. Eyes: Conjunctivae are normal.  Pupils equal round and reactive to light bilaterally. Head:  Atraumatic. Nose: No congestion/rhinnorhea. Mouth/Throat: Mucous membranes are  moist. Neck: Normal ROM Cardiovascular: Normal rate, regular rhythm. Grossly normal heart sounds. Respiratory: Normal respiratory effort.  No retractions. Lungs CTAB. Gastrointestinal: Soft and nontender. No distention. Genitourinary: deferred Musculoskeletal: No lower extremity tenderness nor edema. Neurologic:  Normal speech and language. No gross focal neurologic deficits are appreciated.  Resting tremor noted, no clonus. Skin:  Skin is warm, dry and intact. No rash noted. Psychiatric: Mood and affect are normal. Speech and behavior are normal.  ____________________________________________   LABS (all labs ordered are listed, but only abnormal results are displayed)  Labs Reviewed  COMPREHENSIVE METABOLIC PANEL - Abnormal; Notable for the following components:      Result Value   CO2 21 (*)    Glucose, Bld 117 (*)    All other components within normal limits  SALICYLATE LEVEL - Abnormal; Notable for the following components:   Salicylate Lvl <6.1 (*)    All other components within normal limits  ACETAMINOPHEN LEVEL - Abnormal; Notable for the following components:   Acetaminophen (Tylenol), Serum <10 (*)    All other components within normal limits  LITHIUM LEVEL  CBC   ____________________________________________  EKG  ED ECG REPORT I, Blake Divine, the attending physician, personally viewed and interpreted this ECG.   Date: 01/14/2020  EKG Time: 15:42  Rate: 89  Rhythm: normal sinus rhythm  Axis: Normal  Intervals:none  ST&T Change: None   PROCEDURES  Procedure(s) performed (including Critical Care):  Procedures   ____________________________________________   INITIAL IMPRESSION / ASSESSMENT AND PLAN / ED COURSE       44 year old female with past medical history of hypertension, hyperlipidemia, diabetes, migraines, and bipolar disorder who presents to the ED for  increasing tremors and memory difficulties for the past 3 to 4 days, was told earlier today that her lithium level was high.  Repeat lithium level is within normal limits, renal function and electrolytes are also unremarkable.  Patient is calm and cooperative, appropriately oriented and answering questions without difficulty.  She has no focal neurologic deficits on exam but does have a resting tremor.  Given ongoing symptoms with normalized lithium level, I doubt this is the primary cause of her symptoms.  I would be more concerned about a serotonin syndrome given she was recently started on bupropion and already takes an SSRI.  Symptoms do appear to be relatively mild at this time as she can ambulate without difficulty and vital signs are within normal limits.  I have counseled her to stop the Wellbutrin and we will give dose of Ativan here in the ED.  If patient remains stable, should be appropriate for discharge home with close psychiatry and neurology follow-up.  Patient reports feeling better following dose of Ativan and is requesting to be discharged home.  She was counseled to return to the ED for new worsening symptoms and otherwise follow-up closely with her psychiatrist and neurology.  Patient agrees with plan.      ____________________________________________   FINAL CLINICAL IMPRESSION(S) / ED DIAGNOSES  Final diagnoses:  Tremors of nervous system  Serotonin syndrome     ED Discharge Orders    None       Note:  This document was prepared using Dragon voice recognition software and may include unintentional dictation errors.   Blake Divine, MD 01/14/20 1721

## 2020-01-14 NOTE — ED Triage Notes (Signed)
Pt in via POV; reports being sent for further evaluation due to possible Lithium toxicity per Psychiatry.  Per Psychiatry note; Lithium 1.2.  Pt report worsening tremors and multiple falls over the last few days.  Vitals WDL, NAD noted at this time.

## 2020-01-14 NOTE — Telephone Encounter (Signed)
Reviewed lithium level in the system-dated 01/13/2020-1.2-elevated-borderline.  Called patient.  Patient as well as husband reported that patient has been having worsening memory changes, tremors, falls since the past few days.  Discussed with patient as well as husband that lithium could be causing these problems.  Lithium level is high. Advised patient to stop the lithium for now.  Advised to go to the nearest emergency department if symptoms worsen or does not get better. Advised patient to stay well-hydrated.  Per review of medical records patient has been referred for neurology urgent consultation by primary care provider.

## 2020-01-14 NOTE — Telephone Encounter (Signed)
Called patient again , discussed to go to ED right now for evaluation of Lithium level/toxicity if she is falling and symptoms worsening.

## 2020-01-14 NOTE — Progress Notes (Signed)
Contacted via MyChart   Good evening Vanessa Baker, I know Dr. Shea Evans spoke with you today about Lithium level and you are currently in ER.  I am still waiting on some labs to return and alert you when they do come back.  Overall remainder of current labs remain stable with exception of B12 level which remains on low normal side.  I would recommend you take Vitamin B12 1000 MCG daily, this is a vitamin you can get over the counter and is good for nervous system health, including brain health.  Your MRI also returned showing no acute or concerning issues.  Let me know how ER visit goes and I will see you after this.  Any questions? Keep being awesome!!  Thank you for allowing me to participate in your care. Kindest regards, Vanessa Baker

## 2020-01-14 NOTE — Discharge Instructions (Signed)
You should stop taking your Wellbutrin as this could be contributing to your tremor and memory difficulties.  You should also go back to your previous lower dose of lithium given your levels were high yesterday, although this is less likely to be causing her symptoms as levels were normal today.  You should call your psychiatric provider tomorrow and also arrange for follow-up with neurology and GI as previously referred.  If symptoms are worsening, please return to the ER for reevaluation.

## 2020-01-15 LAB — TSH: TSH: 2.88 u[IU]/mL (ref 0.450–4.500)

## 2020-01-15 LAB — COMPREHENSIVE METABOLIC PANEL
ALT: 14 IU/L (ref 0–32)
AST: 18 IU/L (ref 0–40)
Albumin/Globulin Ratio: 1.7 (ref 1.2–2.2)
Albumin: 4.4 g/dL (ref 3.8–4.8)
Alkaline Phosphatase: 86 IU/L (ref 44–121)
BUN/Creatinine Ratio: 10 (ref 9–23)
BUN: 8 mg/dL (ref 6–24)
Bilirubin Total: 0.2 mg/dL (ref 0.0–1.2)
CO2: 17 mmol/L — ABNORMAL LOW (ref 20–29)
Calcium: 10 mg/dL (ref 8.7–10.2)
Chloride: 99 mmol/L (ref 96–106)
Creatinine, Ser: 0.77 mg/dL (ref 0.57–1.00)
GFR calc Af Amer: 108 mL/min/{1.73_m2} (ref >59)
GFR calc non Af Amer: 94 mL/min/{1.73_m2} (ref >59)
Globulin, Total: 2.6 g/dL (ref 1.5–4.5)
Glucose: 71 mg/dL (ref 65–99)
Potassium: 4.5 mmol/L (ref 3.5–5.2)
Sodium: 136 mmol/L (ref 134–144)
Total Protein: 7 g/dL (ref 6.0–8.5)

## 2020-01-15 LAB — HIV ANTIBODY (ROUTINE TESTING W REFLEX): HIV Screen 4th Generation wRfx: NONREACTIVE

## 2020-01-15 LAB — SEDIMENTATION RATE: Sed Rate: 8 mm/hr (ref 0–32)

## 2020-01-15 LAB — RPR: RPR Ser Ql: NONREACTIVE

## 2020-01-15 LAB — C-REACTIVE PROTEIN: CRP: 5 mg/L (ref 0–10)

## 2020-01-15 LAB — LITHIUM LEVEL: Lithium Lvl: 1.2 mmol/L (ref 0.5–1.2)

## 2020-01-15 LAB — MAGNESIUM: Magnesium: 1.9 mg/dL (ref 1.6–2.3)

## 2020-01-15 LAB — VITAMIN B12: Vitamin B-12: 317 pg/mL (ref 232–1245)

## 2020-01-19 ENCOUNTER — Telehealth: Payer: Self-pay

## 2020-01-19 NOTE — Telephone Encounter (Signed)
    MA12/14/2021 2nd Attempt  Name: Vanessa Baker   MRN: [id number]   DOB: 01-13-1976   AGE: 44 y.o.   GENDER: female   PCP Venita Lick, NP.   Referral Reason: Utilities assistance  Interventions: 2nd Unsuccessful outbound call placed to the patient. A HIPPA compliant voice message left requesting a return call  Follow up plan: Care guide will follow up with patient by phone over the next 7 days     Aymee Fomby, AAS Paralegal, [place] . Embedded Care Coordination [facility]  Care Management  [address], [place] [address] [email or web address]  [id number]   [email or web address]

## 2020-01-19 NOTE — Telephone Encounter (Signed)
Open patient chart for a chart review for Quality measures.

## 2020-01-21 ENCOUNTER — Telehealth: Payer: Self-pay

## 2020-01-21 ENCOUNTER — Encounter: Payer: Self-pay | Admitting: *Deleted

## 2020-01-21 NOTE — Telephone Encounter (Signed)
    MA12/16/2021 3rd Attempt  Name: Vanessa Baker   MRN: [id number]   DOB: [birth year]   AGE: 44 y.o.   GENDER: female   PCP Venita Lick, NP.   Referral Reason: Financial Difficulties related to utilities  Interventions: A HIPAA compliant voice message was left requesting a return call. 3rd unsuccessful outbound call placed to patient.  Follow up plan: Unable to contact patient after three attempts. Closing referral.   Hager Compston, AAS Paralegal, [place] . Embedded Care Coordination [facility]  Care Management  [address], [place] [address] [email or web address]  [phone]   [email or web address]

## 2020-01-22 ENCOUNTER — Telehealth: Payer: Self-pay | Admitting: Licensed Clinical Social Worker

## 2020-01-22 ENCOUNTER — Telehealth: Payer: Medicare HMO

## 2020-01-22 NOTE — Telephone Encounter (Signed)
Chronic Care Management    Clinical Social Work General Follow Up Note  01/22/2020 Name: Vanessa Baker MRN: [id number] DOB: [birth year]  Vanessa Baker is a 44 y.o. year old female who is a primary care patient of Cannady, Barbaraann Faster, NP. The CCM team was consulted for assistance with [employer] .   Review of patient status, including review of consultants reports, relevant laboratory and other test results, and collaboration with appropriate care team members and the patient's provider was performed as part of comprehensive patient evaluation and provision of chronic care management services.    LCSW completed CCM outreach attempt today but was unable to reach patient successfully. A HIPPA compliant voice message was left encouraging patient to return call once available. LCSW will ask Scheduling Care Guide to reschedule CCM SW appointment with patient as well.  Outpatient Encounter Medications as of 01/22/2020  Medication Sig  . ACCU-CHEK AVIVA PLUS test strip USE 1 STRIP TO CHECK GLUCOSE UP TO 3 TIMES DAILY AS DIRECTED.  . Accu-Chek Softclix Lancets lancets Use to check blood sugar up to 3 times daily as instructed  . albuterol (VENTOLIN HFA) 108 (90 Base) MCG/ACT inhaler Inhale 2 puffs into the lungs every 6 (six) hours as needed for wheezing or shortness of breath.  Marland Kitchen amantadine (SYMMETREL) 100 MG capsule Take by mouth at bedtime.  Marland Kitchen atorvastatin (LIPITOR) 40 MG tablet Take 1 tablet (40 mg total) by mouth daily.  . baclofen (LIORESAL) 10 MG tablet Take 1 tablet (10 mg total) by mouth 3 (three) times daily as needed for muscle spasms.  . benztropine (COGENTIN) 1 MG tablet Take 1 tablet (1 mg total) by mouth 3 (three) times daily.  . blood glucose meter kit and supplies Dispense based on patient and insurance preference. Use up to four times daily as directed. (FOR ICD-10 E10.9, E11.9).  Marland Kitchen Blood Glucose Monitoring Suppl (ACCU-CHEK AVIVA PLUS) w/Device KIT Use to  check blood sugar up to 3 times daily  . buPROPion (WELLBUTRIN SR) 150 MG 12 hr tablet Take 1 tablet (150 mg total) by mouth as directed. Start taking 1 tablet daily in the AM for 1 week and increase to 1 tablet in the AM and PM after a week  . Calcium Carb-Cholecalciferol (CALCIUM 600+D3 PO) Take 1 tablet by mouth daily.  . cetirizine (ZYRTEC) 10 MG tablet Take 1 tablet (10 mg total) by mouth daily as needed for allergies (itching).  . Dulaglutide (TRULICITY) 1.5 FF/6.3WG SOPN Inject 1.5 mg into the skin every 7 (seven) days.  . fluticasone-salmeterol (ADVAIR HFA) 115-21 MCG/ACT inhaler Inhale 2 puffs into the lungs 2 (two) times daily.  Marland Kitchen gabapentin (NEURONTIN) 300 MG capsule Take 1 capsule (300 mg total) by mouth 2 (two) times daily. For restless legs  . lisinopril (ZESTRIL) 10 MG tablet Take 1 tablet (10 mg total) by mouth daily.  . metFORMIN (GLUCOPHAGE) 500 MG tablet Take 1 tablet (500 mg total) by mouth 2 (two) times daily.  . Multiple Vitamins-Minerals (MULTIVITAMIN ADULT EXTRA C) CHEW Chew by mouth.  . norethindrone-ethinyl estradiol (JUNEL FE 1/20) 1-20 MG-MCG tablet Take 1 tablet by mouth daily.  Marland Kitchen OLANZapine (ZYPREXA) 20 MG tablet Take 1 tablet (20 mg total) by mouth at bedtime.  Marland Kitchen omega-3 acid ethyl esters (LOVAZA) 1 g capsule Take 1 capsule (1 g total) by mouth 2 (two) times daily.  Marland Kitchen omeprazole (PRILOSEC) 20 MG capsule Take 1 capsule (20 mg total) by mouth daily.  . primidone (MYSOLINE) 50 MG tablet  Take 1 tablet (50 mg total) by mouth at bedtime.  . sertraline (ZOLOFT) 50 MG tablet Take 1 tablet (50 mg total) by mouth daily.  Marland Kitchen tiZANidine (ZANAFLEX) 4 MG tablet   . traZODone (DESYREL) 100 MG tablet TAKE 1 & 1/2 (ONE & ONE-HALF) TABLETS BY MOUTH AT BEDTIME FOR SLEEP  . triamcinolone (KENALOG) 0.1 % Apply 1 application topically 2 (two) times daily.   No facility-administered encounter medications on file as of 01/22/2020.   Follow Up Plan: [facility] will reach out to  patient to reschedule appointment.   Eula Fried, BSW, MSW, [facility] Care Management [email or web address] Phone: [phone]

## 2020-01-28 ENCOUNTER — Telehealth: Payer: Self-pay

## 2020-01-28 NOTE — Chronic Care Management (AMB) (Signed)
°  Care Management   Note  01/28/2020 Name: Vanessa Baker MRN: [id number] DOB: [birth year]  [name] is a 44 y.o. year old female who is a primary care patient of Venita Lick, NP and is actively engaged with the care management team. I reached out to [employer] by phone today to assist with re-scheduling a follow up visit with the Licensed Clinical Social Worker  Follow up plan: Unsuccessful telephone outreach attempt made. A HIPAA compliant phone message was left for the patient providing contact information and requesting a return call.  The care management team will reach out to the patient again over the next 7 days.  If patient returns call to provider office, please advise to call [facility] at [place], [address], [place], [place] [address] Direct Dial: [phone] [name]

## 2020-01-29 ENCOUNTER — Telehealth: Payer: Medicare HMO | Admitting: Psychiatry

## 2020-02-01 ENCOUNTER — Ambulatory Visit (INDEPENDENT_AMBULATORY_CARE_PROVIDER_SITE_OTHER): Payer: Medicare HMO | Admitting: Nurse Practitioner

## 2020-02-01 ENCOUNTER — Other Ambulatory Visit: Payer: Self-pay

## 2020-02-01 ENCOUNTER — Encounter: Payer: Self-pay | Admitting: Nurse Practitioner

## 2020-02-01 VITALS — BP 125/68 | HR 91 | Temp 98.5°F | Wt 180.0 lb

## 2020-02-01 DIAGNOSIS — G2111 Neuroleptic induced parkinsonism: Secondary | ICD-10-CM | POA: Diagnosis not present

## 2020-02-01 DIAGNOSIS — E538 Deficiency of other specified B group vitamins: Secondary | ICD-10-CM | POA: Diagnosis not present

## 2020-02-01 DIAGNOSIS — E1169 Type 2 diabetes mellitus with other specified complication: Secondary | ICD-10-CM | POA: Diagnosis not present

## 2020-02-01 DIAGNOSIS — R21 Rash and other nonspecific skin eruption: Secondary | ICD-10-CM

## 2020-02-01 DIAGNOSIS — R413 Other amnesia: Secondary | ICD-10-CM

## 2020-02-01 DIAGNOSIS — R1310 Dysphagia, unspecified: Secondary | ICD-10-CM

## 2020-02-01 DIAGNOSIS — E785 Hyperlipidemia, unspecified: Secondary | ICD-10-CM

## 2020-02-01 MED ORDER — PREDNISONE 10 MG PO TABS: ORAL_TABLET | ORAL | 0 refills | Status: DC

## 2020-02-01 MED ORDER — B-12 1000 MCG PO TBDP: 1000.0000 ug | ORAL_TABLET | Freq: Every day | ORAL | 4 refills | Status: DC

## 2020-02-01 MED ORDER — OMEGA-3-ACID ETHYL ESTERS 1 G PO CAPS: 1.0000 | ORAL_CAPSULE | Freq: Two times a day (BID) | ORAL | 4 refills | Status: DC

## 2020-02-01 NOTE — Progress Notes (Signed)
BP 125/68   Pulse 91   Temp 98.5 F (36.9 C)   Wt 180 lb (81.6 kg) Comment: Patient reported  LMP 11/26/2018 Comment: pt denies pregnancy-states no period in two years  SpO2 96%   BMI 32.92 kg/m    Subjective:    Patient ID: Vanessa Baker, female    DOB: 01/28/1976, 44 y.o.   MRN: [id number]  HPI: Vanessa Baker is a 44 y.o. female  Chief Complaint  Patient presents with  . Memory Loss    Patient has not went to neurology yet due to having to have a copay  . Rash   MEMORY CHANGES AND DIZZINESS Follow today from 01/13/20 where she complained of memory changes and dizziness + issues with dysphagia.  Labs performed at visit noted mildly elevated Lithium level and psychiatry sent patient to ER.  B12 level was on low side normal at 317.  Remainder of labs unremarkable.  She had imaging on 01/14/20 with unremarkable findings, no acute abnormality.  Has not started B12 supplement as of yet.  She reports Dr. Shea Evans took her off Wellbutrin and reports memory is improving some and dizziness.  She has not schedule with GI or neurology as of yet. Duration: days Description of symptoms: off kilter , room spinning Duration of episode: minutes Dizziness frequency: recurrent Provoking factors: none -- no warning at all Aggravating factors:  none Triggered by rolling over in bed: no Triggered by bending over: no Aggravated by head movement: no Aggravated by exertion, coughing, loud noises: no Recent head injury: has concussion as a young child Recent or current viral symptoms: no History of vasovagal episodes: no Nausea: yes Vomiting: no Tinnitus: no Hearing loss: no Aural fullness: no Headache: occasional Photophobia/phonophobia: no Unsteady gait: yes Postural instability: no Diplopia, dysarthria, dysphagia or weakness: yes Related to exertion: no Pallor: no Diaphoresis: no Dyspnea: no Chest pain: no  6CIT Screen 02/01/2020 01/13/2020 01/08/2020 11/19/2017   What Year? 0 points 0 points 0 points 0 points  What month? 0 points 0 points 0 points 0 points  What time? 0 points 0 points 3 points 0 points  Count back from 20 2 points 4 points 2 points 0 points  Months in reverse 2 points 4 points 4 points 0 points  Repeat phrase 8 points 10 points 8 points 0 points  Total Score 12 18 17  0   RASH Follow-up for rash noted at visit in November.  Using Triamcinolone cream.  Reports initially the cream benefited and Benadryl, which is no longer helping.  Now present to legs and arms. Duration:  weeks  Location: arms  Itching: yes Burning: no Redness: yes Oozing: no Scaling: yes Blisters: no Painful: no Fevers: no Change in detergents/soaps/personal care products: no Recent illness: no Recent travel:no History of same: no Context: fluctuating Alleviating factors: Zyrtec and Calamine lotion -- but do not help Treatments attempted:Zyrtec and Calamine., hydrocortisone cream and benadryl Shortness of breath: no  Throat/tongue swelling: no Myalgias/arthralgias: no  Relevant past medical, surgical, family and social history reviewed and updated as indicated. Interim medical history since our last visit reviewed. Allergies and medications reviewed and updated.  Review of Systems  Constitutional: Negative for activity change, appetite change, diaphoresis, fatigue and fever.  Respiratory: Negative for cough, chest tightness, shortness of breath and wheezing.   Cardiovascular: Negative for chest pain, palpitations and leg swelling.  Gastrointestinal: Negative for abdominal distention, abdominal pain, anal bleeding, blood in stool, constipation, diarrhea, nausea and vomiting.  Endocrine: Negative for cold intolerance, heat intolerance, polydipsia, polyphagia and polyuria.  Neurological: Negative for dizziness, tremors, syncope, weakness, light-headedness, numbness and headaches.  Psychiatric/Behavioral: Negative for decreased concentration,  self-injury, sleep disturbance and suicidal ideas.    Per HPI unless specifically indicated above     Objective:    BP 125/68   Pulse 91   Temp 98.5 F (36.9 C)   Wt 180 lb (81.6 kg) Comment: Patient reported  LMP 11/26/2018 Comment: pt denies pregnancy-states no period in two years  SpO2 96%   BMI 32.92 kg/m   Wt Readings from Last 3 Encounters:  02/01/20 180 lb (81.6 kg)  01/14/20 170 lb (77.1 kg)  01/13/20 174 lb 3.2 oz (79 kg)    Physical Exam Vitals and nursing note reviewed.  Constitutional:      General: She is awake. She is not in acute distress.    Appearance: She is well-developed and well-groomed. She is obese. She is not ill-appearing.  HENT:     Head: Normocephalic.     Right Ear: Hearing, tympanic membrane, ear canal and external ear normal.     Left Ear: Hearing, tympanic membrane, ear canal and external ear normal.     Nose: Nose normal.     Mouth/Throat:     Mouth: Mucous membranes are moist.  Eyes:     General: Lids are normal.        Right eye: No discharge.        Left eye: No discharge.     Extraocular Movements: Extraocular movements intact.     Conjunctiva/sclera: Conjunctivae normal.     Pupils: Pupils are equal, round, and reactive to light.     Visual Fields: Right eye visual fields normal.  Neck:     Thyroid: No thyromegaly.     Vascular: No carotid bruit.  Cardiovascular:     Rate and Rhythm: Normal rate and regular rhythm.     Heart sounds: Normal heart sounds. No murmur heard. No gallop.   Pulmonary:     Effort: Pulmonary effort is normal. No accessory muscle usage or respiratory distress.     Breath sounds: Normal breath sounds.  Abdominal:     General: Bowel sounds are normal.     Palpations: Abdomen is soft. There is no hepatomegaly.     Tenderness: There is no abdominal tenderness.  Musculoskeletal:     Cervical back: Normal range of motion and neck supple.     Right lower leg: No edema.     Left lower leg: No edema.   Lymphadenopathy:     Cervical: No cervical adenopathy.  Skin:    General: Skin is warm and dry.     Findings: Rash present. Rash is macular. Rash is not vesicular.     Comments: Macular rash scattered to lower extremities and upper extremities.  No vesicles or drainage.  No scaling.  Neurological:     Mental Status: She is alert.     Cranial Nerves: Cranial nerves are intact.     Sensory: Sensation is intact.     Motor: Tremor (upper bilateral) present. No weakness.     Coordination: Finger-Nose-Finger Test abnormal (tremor bilaterall, slow).     Gait: Gait is intact.     Deep Tendon Reflexes: Reflexes are normal and symmetric.     Reflex Scores:      Brachioradialis reflexes are 2+ on the right side and 2+ on the left side.      Patellar reflexes are 2+  on the right side and 2+ on the left side.    Comments: Oriented to person, place, and time -- improved ability to follow and repeat directions today.  Psychiatric:        Attention and Perception: Attention normal.        Mood and Affect: Mood normal.        Speech: Speech normal.        Behavior: Behavior normal. Behavior is cooperative.        Thought Content: Thought content normal.    Results for orders placed or performed during the hospital encounter of 01/14/20  Lithium level  Result Value Ref Range   Lithium Lvl 0.83 0.60 - 1.20 mmol/L  Comprehensive metabolic panel  Result Value Ref Range   Sodium 135 135 - 145 mmol/L   Potassium 4.1 3.5 - 5.1 mmol/L   Chloride 102 98 - 111 mmol/L   CO2 21 (L) 22 - 32 mmol/L   Glucose, Bld 117 (H) 70 - 99 mg/dL   BUN 7 6 - 20 mg/dL   Creatinine, Ser 0.74 0.44 - 1.00 mg/dL   Calcium 9.2 8.9 - 10.3 mg/dL   Total Protein 7.0 6.5 - 8.1 g/dL   Albumin 4.0 3.5 - 5.0 g/dL   AST 27 15 - 41 U/L   ALT 18 0 - 44 U/L   Alkaline Phosphatase 64 38 - 126 U/L   Total Bilirubin 0.6 0.3 - 1.2 mg/dL   GFR, Estimated >60 >60 mL/min   Anion gap 12 5 - 15  Salicylate level  Result Value Ref  Range   Salicylate Lvl <3.7 (L) 7.0 - 30.0 mg/dL  Acetaminophen level  Result Value Ref Range   Acetaminophen (Tylenol), Serum <10 (L) 10 - 30 ug/mL  cbc  Result Value Ref Range   WBC 10.3 4.0 - 10.5 K/uL   RBC 4.68 3.87 - 5.11 MIL/uL   Hemoglobin 13.4 12.0 - 15.0 g/dL   HCT 41.7 36.0 - 46.0 %   MCV 89.1 80.0 - 100.0 fL   MCH 28.6 26.0 - 34.0 pg   MCHC 32.1 30.0 - 36.0 g/dL   RDW 13.1 11.5 - 15.5 %   Platelets 330 150 - 400 K/uL   nRBC 0.0 0.0 - 0.2 %      Assessment & Plan:   Problem List Items Addressed This Visit      Digestive   Dysphagia    Ongoing, some improvement.  Noted with solids. Continue Prilosec daily.  Recommend she schedule with GI as soon as possible for further evaluation.        Endocrine   Hyperlipidemia associated with type 2 diabetes mellitus (HCC)   Relevant Medications   omega-3 acid ethyl esters (LOVAZA) 1 g capsule     Nervous and Auditory   Neuroleptic induced parkinsonism (HCC) - Primary    Chronic, stable.  Continue collaboration with neurology, has not seen since May 2020, and current medication regimen as prescribed by them.  Recommend she schedule follow-up as soon as possible due to current memory changes.        Musculoskeletal and Integument   Rash    Ongoing, with minimal improvement.  Prednisone taper sent in and continue Triamcinolone.  Recommend she continue daily Zyrtec and avoid scratching at area, may use Benadryl cream.  Return to office in 2 weeks and if ongoing consider referral to dermatology.        Other   Vitamin B12 deficiency    Noted  on recent labs, low normal.  Script for Vitamin B12 sent, will offer benefit to memory.  Recheck next visit.      Memory changes    Improving with psychiatry medication changes.  6CIT -- 12 today improved from previous of 18.  Recommend she schedule with neurology as soon as possible for follow-up visit.   Overall neuro exam no red flags, with exception of memory changes and bilateral  tremor upper, tremor at baseline.            Follow up plan: Return in about 2 weeks (around 02/15/2020) for Rash.

## 2020-02-01 NOTE — Assessment & Plan Note (Signed)
Ongoing, with minimal improvement.  Prednisone taper sent in and continue Triamcinolone.  Recommend she continue daily Zyrtec and avoid scratching at area, may use Benadryl cream.  Return to office in 2 weeks and if ongoing consider referral to dermatology.

## 2020-02-01 NOTE — Assessment & Plan Note (Signed)
Noted on recent labs, low normal.  Script for Vitamin B12 sent, will offer benefit to memory.  Recheck next visit.

## 2020-02-01 NOTE — Assessment & Plan Note (Signed)
Improving with psychiatry medication changes.  6CIT -- 12 today improved from previous of 18.  Recommend she schedule with neurology as soon as possible for follow-up visit.   Overall neuro exam no red flags, with exception of memory changes and bilateral tremor upper, tremor at baseline.

## 2020-02-01 NOTE — Assessment & Plan Note (Signed)
Chronic, stable.  Continue collaboration with neurology, has not seen since May 2020, and current medication regimen as prescribed by them.  Recommend she schedule follow-up as soon as possible due to current memory changes.

## 2020-02-01 NOTE — Assessment & Plan Note (Signed)
Ongoing, some improvement.  Noted with solids. Continue Prilosec daily.  Recommend she schedule with GI as soon as possible for further evaluation.

## 2020-02-01 NOTE — Patient Instructions (Addendum)
[facility] Phone to schedule -- [phone]  Neurology Dr. Melrose Nakayama -- [phone]  Memory Compensation Strategies  1. Use "WARM" strategy.  W= write it down  A= associate it  R= repeat it  M= make a mental note  2.   You can keep a Social worker.  Use a 3-ring notebook with sections for the following: calendar, important names and phone numbers,  medications, doctors' names/phone numbers, lists/reminders, and a section to journal what you did  each day.   3.    Use a calendar to write appointments down.  4.    Write yourself a schedule for the day.  This can be placed on the calendar or in a separate section of the Memory Notebook.  Keeping a  regular schedule can help memory.  5.    Use medication organizer with sections for each day or morning/evening pills.  You may need help loading it  6.    Keep a basket, or pegboard by the door.  Place items that you need to take out with you in the basket or on the pegboard.  You may also want to  include a message board for reminders.  7.    Use sticky notes.  Place sticky notes with reminders in a place where the task is performed.  For example: " turn off the  stove" placed by the stove, "lock the door" placed on the door at eye level, " take your medications" on  the bathroom mirror or by the place where you normally take your medications.  8.    Use alarms/timers.  Use while cooking to remind yourself to check on food or as a reminder to take your medicine, or as a  reminder to make a call, or as a reminder to perform another task, etc.

## 2020-02-02 NOTE — Chronic Care Management (AMB) (Signed)
  Care Management   Note  02/02/2020 Name: Keela Rubert MRN: [id number] DOB: Oct 17, 1975  Harold Hedge Quin is a 44 y.o. year old female who is a primary care patient of Venita Lick, NP and is actively engaged with the care management team. I reached out to [employer] by phone today to assist with re-scheduling a follow up visit with the Licensed Clinical Social Worker  Follow up plan: Telephone appointment with care management team member scheduled for:02/26/2019  Noreene Larsson, [place], [place], [place] [address] Direct Dial: [phone] Kerigan [email or web address] Website: [email or web address]

## 2020-02-02 NOTE — Telephone Encounter (Signed)
Patient has been r/s  

## 2020-02-04 DIAGNOSIS — C3400 Malignant neoplasm of unspecified main bronchus: Secondary | ICD-10-CM | POA: Diagnosis not present

## 2020-02-04 DIAGNOSIS — J9601 Acute respiratory failure with hypoxia: Secondary | ICD-10-CM | POA: Diagnosis not present

## 2020-02-04 DIAGNOSIS — J4541 Moderate persistent asthma with (acute) exacerbation: Secondary | ICD-10-CM | POA: Diagnosis not present

## 2020-02-16 ENCOUNTER — Ambulatory Visit: Payer: Medicare HMO | Admitting: Nurse Practitioner

## 2020-02-24 ENCOUNTER — Encounter: Payer: Self-pay | Admitting: Psychiatry

## 2020-02-24 ENCOUNTER — Telehealth: Payer: Self-pay

## 2020-02-24 ENCOUNTER — Other Ambulatory Visit: Payer: Self-pay

## 2020-02-24 ENCOUNTER — Telehealth (INDEPENDENT_AMBULATORY_CARE_PROVIDER_SITE_OTHER): Payer: Medicare HMO | Admitting: Psychiatry

## 2020-02-24 DIAGNOSIS — F3176 Bipolar disorder, in full remission, most recent episode depressed: Secondary | ICD-10-CM

## 2020-02-24 DIAGNOSIS — F1421 Cocaine dependence, in remission: Secondary | ICD-10-CM

## 2020-02-24 DIAGNOSIS — Z79899 Other long term (current) drug therapy: Secondary | ICD-10-CM

## 2020-02-24 DIAGNOSIS — G2581 Restless legs syndrome: Secondary | ICD-10-CM | POA: Diagnosis not present

## 2020-02-24 DIAGNOSIS — F4310 Post-traumatic stress disorder, unspecified: Secondary | ICD-10-CM | POA: Diagnosis not present

## 2020-02-24 DIAGNOSIS — F1011 Alcohol abuse, in remission: Secondary | ICD-10-CM

## 2020-02-24 DIAGNOSIS — G2111 Neuroleptic induced parkinsonism: Secondary | ICD-10-CM

## 2020-02-24 DIAGNOSIS — F1211 Cannabis abuse, in remission: Secondary | ICD-10-CM

## 2020-02-24 DIAGNOSIS — R69 Illness, unspecified: Secondary | ICD-10-CM | POA: Diagnosis not present

## 2020-02-24 NOTE — Telephone Encounter (Signed)
Appointment referral and Order Follow Up - Telephone call with Junie Panning at [facility] on [address] to arrange to fax over pt's agreed upon Sleep Study request referral and today's office note from Dr. Shea Evans.  Dr. Shea Evans verified patient gave verbal consent since was seen virtually today to fax over referral and to also send lab orders to [facility] main lab.  Both referral and orders faxed to both locations per Dr. Charlcie Cradle request and patient's approval.  Fax # for [facility] was verified as [phone] and [facility] lab fax as [phone].  Patient should be contacted over the coming week and once authorized to set up sleep study. Copies sent to scan.

## 2020-02-24 NOTE — Progress Notes (Signed)
Virtual Visit via Video Note  I connected with Vanessa Baker on 02/24/20 at  2:20 PM EST by a video enabled telemedicine application and verified that I am speaking with the correct person using two identifiers.  Location Provider Location : [facility] Patient Location : Home  Participants: Patient , Provider   I discussed the limitations of evaluation and management by telemedicine and the availability of in person appointments. The patient expressed understanding and agreed to proceed.   I discussed the assessment and treatment plan with the patient. The patient was provided an opportunity to ask questions and all were answered. The patient agreed with the plan and demonstrated an understanding of the instructions.   The patient was advised to call back or seek an in-person evaluation if the symptoms worsen or if the condition fails to improve as anticipated.  Video connection was lost at less than 50% of the duration of the visit, at which time the remainder of the visit was completed through audio only      [facility] MD OP Progress Note  02/24/2020 5:50 PM Derika Yessica Putnam  MRN:  [id number]  Chief Complaint:  Chief Complaint    Follow-up     HPI: Vanessa Baker is a 45 year old Caucasian female, divorced, has a history of bipolar disorder, PTSD, neuroleptic induced parkinsonian syndrome, tobacco use disorder, cocaine use disorder, alcohol use disorder, hyperlipidemia, IBS was evaluated by telemedicine today.  Patient today reports she is currently struggling with episodes of sleep problems including possible sleepwalking.  Patient however reports she is aware about what she does when she walks around her sleep and remember talking to her husband.  She reports she had reduced the dosage of trazodone since she did not want to be on too much medication at night however would like to go back to her previous dosage of trazodone to see if that will help.  Patient denies any  significant mood lability.  She denies any depressive symptoms.  Patient continues to struggle with cognitive issues, memory changes.  She was advised to follow-up with her neurologist however has been noncompliant.  She agrees to get in touch with neurology as soon as possible.  Patient reports she continues to struggle with sexual dysfunction.  She had to stop taking the Wellbutrin due to side effects of possible serotonin syndrome which led to the emergency department visit.  Patient is compliant on the lithium as prescribed.  She denies any side effects.  She denies any suicidality, homicidality or perceptual disturbances.  Patient denies any other concerns today.  Visit Diagnosis:    ICD-10-CM   1. Bipolar disorder, in full remission, most recent episode depressed ([place])  F31.76   2. PTSD (post-traumatic stress disorder)  F43.10   3. Neuroleptic induced parkinsonism ([place])  G21.11   4. Restless leg syndrome  G25.81   5. Cocaine use disorder, moderate, in sustained remission (HCC)  F14.21   6. Alcohol use disorder, mild, in early remission  F10.11   7. Cannabis use disorder, mild, in early remission  F12.11   8. High risk medication use  Z79.899 Lithium level    BUN+Creat    Polysomnography 4 or more parameters    Past Psychiatric History: I have reviewed past psychiatric history from my progress note on 04/02/2017.  Past trials of Seroquel, Latuda, Ambien, Klonopin, Topamax, gabapentin, Trintellix, Abilify, Rexulti, Artane  Past Medical History:  Past Medical History:  Diagnosis Date  . Allergy   . Anxiety   .  Asthma   . Bipolar disorder (HCC)   . Bursitis   . Depression   . Diabetes mellitus without complication (HCC)   . Essential hypertension 03/28/2017  . Frequent headaches   . GERD (gastroesophageal reflux disease)   . Headache   . High cholesterol   . Hypertension   . Irritable bowel syndrome (IBS)   . Irritable colon 02/23/2010  . Migraine 04/02/2017  . Obesity  (BMI 30-39.9) 11/25/2013  . Seizure (HCC) [phone]  . Seizures (HCC)   . Tremor     Past Surgical History:  Procedure Laterality Date  . CHOLECYSTECTOMY N/A 04/27/2019   Procedure: LAPAROSCOPIC CHOLECYSTECTOMY;  Surgeon: Duanne Guess, MD;  Location: [facility] ORS;  Service: General;  Laterality: N/A;  . NO PAST SURGERIES      Family Psychiatric History: I have reviewed family psychiatric history from my progress note on 04/02/2017  Family History:  Family History  Problem Relation Age of Onset  . Bipolar disorder Mother   . Schizophrenia Mother   . Hypertension Mother   . Diabetes Mother   . Cancer Mother   . Anxiety disorder Mother   . ADD / ADHD Mother   . Alcohol abuse Mother   . Drug abuse Mother   . Heart disease Mother   . Bipolar disorder Sister   . Anxiety disorder Sister   . Drug abuse Sister   . Bipolar disorder Brother   . Anxiety disorder Brother   . Drug abuse Brother   . Hypertension Father   . Cancer Father   . Diabetes Father   . Breast cancer Maternal Aunt   . Leukemia Maternal Aunt   . Breast cancer Paternal Aunt   . Breast cancer Maternal Grandmother   . Alcohol abuse Maternal Grandmother   . Bipolar disorder Maternal Grandmother   . Breast cancer Paternal Grandmother   . Parkinson's disease Paternal Grandmother   . Alcohol abuse Maternal Grandfather   . Prostate cancer Maternal Grandfather   . Alcohol abuse Maternal Uncle   . Multiple myeloma Maternal Uncle   . ADD / ADHD Son   . Throat cancer Maternal Aunt   . Tremor Neg Hx     Social History: I have reviewed social history from my progress note on 04/02/2017 Social History   Socioeconomic History  . Marital status: Married    Spouse name: dewey   . Number of children: 1  . Years of education: 2  . Highest education level: Associate degree: occupational, Scientist, product/process development, or vocational program  Occupational History  . Occupation: disability  Tobacco Use  . Smoking status: Former Smoker     Packs/day: 0.50    Years: 27.00    Pack years: 13.50    Types: Cigarettes    Quit date: 04/26/2019    Years since quitting: 0.8  . Smokeless tobacco: Never Used  Vaping Use  . Vaping Use: Former  Substance and Sexual Activity  . Alcohol use: Yes  . Drug use: No  . Sexual activity: Yes    Partners: Male    Birth control/protection: None  Other Topics Concern  . Not on file  Social History Narrative   Lives with husband, Renelda Mom (on [place])   Right-handed   Caffeine use: 1 cup coffee a day, 2 soft drinks per day   Social Determinants of Health   Financial Resource Strain: Medium Risk  . Difficulty of Paying Living Expenses: Somewhat hard  Food Insecurity: No Food Insecurity  . Worried About  Running Out of Food in the Last Year: Never true  . Ran Out of Food in the Last Year: Never true  Transportation Needs: No Transportation Needs  . Lack of Transportation (Medical): No  . Lack of Transportation (Non-Medical): No  Physical Activity: Inactive  . Days of Exercise per Week: 0 days  . Minutes of Exercise per Session: 0 min  Stress: Stress Concern Present  . Feeling of Stress : Very much  Social Connections: Moderately Isolated  . Frequency of Communication with Friends and Family: Three times a week  . Frequency of Social Gatherings with Friends and Family: Three times a week  . Attends Religious Services: Never  . Active Member of Clubs or Organizations: No  . Attends Archivist Meetings: Never  . Marital Status: Living with partner    Allergies:  Allergies  Allergen Reactions  . Amitriptyline Other (See Comments)    Confusion, paralysis  . Wellbutrin [Bupropion]     falls    Metabolic Disorder Labs: Lab Results  Component Value Date   HGBA1C 5.1 12/15/2019   MPG 111.15 07/08/2019   MPG 128.37 04/27/2019   Lab Results  Component Value Date   PROLACTIN 8.9 07/08/2019   PROLACTIN 4.6 (L) 04/22/2017   Lab Results  Component Value Date    CHOL 189 12/15/2019   TRIG 359 (H) 12/15/2019   HDL 34 (L) 12/15/2019   CHOLHDL 3.2 07/08/2019   VLDL 63 (H) 07/08/2019   LDLCALC 95 12/15/2019   LDLCALC 46 08/25/2019   Lab Results  Component Value Date   TSH 2.880 01/13/2020   TSH 1.340 12/08/2019    Therapeutic Level Labs: Lab Results  Component Value Date   LITHIUM 0.83 01/14/2020   LITHIUM 1.2 01/13/2020   Lab Results  Component Value Date   VALPROATE 103 (H) 04/26/2019   VALPROATE 61.4 08/13/2017   No components found for:  CBMZ  Current Medications: Current Outpatient Medications  Medication Sig Dispense Refill  . ACCU-CHEK AVIVA PLUS test strip USE 1 STRIP TO CHECK GLUCOSE UP TO 3 TIMES DAILY AS DIRECTED. 300 each 1  . Accu-Chek Softclix Lancets lancets Use to check blood sugar up to 3 times daily as instructed 300 each 1  . albuterol (VENTOLIN HFA) 108 (90 Base) MCG/ACT inhaler Inhale 2 puffs into the lungs every 6 (six) hours as needed for wheezing or shortness of breath. 18 g 4  . amantadine (SYMMETREL) 100 MG capsule Take by mouth at bedtime.    Marland Kitchen atorvastatin (LIPITOR) 40 MG tablet Take 1 tablet (40 mg total) by mouth daily. 90 tablet 4  . baclofen (LIORESAL) 10 MG tablet Take 1 tablet (10 mg total) by mouth 3 (three) times daily as needed for muscle spasms. 90 tablet 4  . benztropine (COGENTIN) 1 MG tablet Take 1 tablet (1 mg total) by mouth 3 (three) times daily. 270 tablet 0  . blood glucose meter kit and supplies Dispense based on patient and insurance preference. Use up to four times daily as directed. (FOR ICD-10 E10.9, E11.9). 1 each 0  . Blood Glucose Monitoring Suppl (ACCU-CHEK AVIVA PLUS) w/Device KIT Use to check blood sugar up to 3 times daily 1 kit 0  . Calcium Carb-Cholecalciferol (CALCIUM 600+D3 PO) Take 1 tablet by mouth daily.    . cetirizine (ZYRTEC) 10 MG tablet Take 1 tablet (10 mg total) by mouth daily as needed for allergies (itching). 30 tablet 0  . Dulaglutide (TRULICITY) 1.5 BM/8.4XL SOPN  Inject 1.5 mg  into the skin every 7 (seven) days. 2 mL 0  . fluticasone-salmeterol (ADVAIR HFA) 115-21 MCG/ACT inhaler Inhale 2 puffs into the lungs 2 (two) times daily. 1 each 12  . gabapentin (NEURONTIN) 300 MG capsule Take 1 capsule (300 mg total) by mouth 2 (two) times daily. For restless legs 180 capsule 1  . lisinopril (ZESTRIL) 10 MG tablet Take 1 tablet (10 mg total) by mouth daily. 30 tablet 0  . lithium 300 MG tablet Take 300 mg by mouth in the morning and at bedtime.    Marland Kitchen lithium carbonate 150 MG capsule Take 150 mg by mouth daily. Take along with 300 mg at bedtime    . metFORMIN (GLUCOPHAGE) 500 MG tablet Take 1 tablet (500 mg total) by mouth 2 (two) times daily. 60 tablet 0  . Methylcobalamin (B-12) 1000 MCG TBDP Take 1,000 mcg by mouth daily. 90 tablet 4  . norethindrone-ethinyl estradiol (JUNEL FE 1/20) 1-20 MG-MCG tablet Take 1 tablet by mouth daily. 84 tablet 0  . OLANZapine (ZYPREXA) 20 MG tablet Take 1 tablet (20 mg total) by mouth at bedtime. 90 tablet 0  . omeprazole (PRILOSEC) 20 MG capsule Take 1 capsule (20 mg total) by mouth daily. 30 capsule 3  . primidone (MYSOLINE) 50 MG tablet Take 1 tablet (50 mg total) by mouth at bedtime. 90 tablet 4  . sertraline (ZOLOFT) 50 MG tablet Take 1 tablet (50 mg total) by mouth daily. 30 tablet 1  . tiZANidine (ZANAFLEX) 4 MG tablet     . traZODone (DESYREL) 100 MG tablet TAKE 1 & 1/2 (ONE & ONE-HALF) TABLETS BY MOUTH AT BEDTIME FOR SLEEP 135 tablet 0  . triamcinolone (KENALOG) 0.1 % Apply 1 application topically 2 (two) times daily. 30 g 0  . Multiple Vitamins-Minerals (MULTIVITAMIN ADULT EXTRA C) CHEW Chew by mouth. (Patient not taking: Reported on 02/24/2020)    . omega-3 acid ethyl esters (LOVAZA) 1 g capsule Take 1 capsule (1 g total) by mouth 2 (two) times daily. (Patient not taking: Reported on 02/24/2020) 180 capsule 4  . predniSONE (DELTASONE) 10 MG tablet Take 6 tablets by mouth daily for 2 days, then reduce by 1 tablet every 2  days until gone (Patient not taking: Reported on 02/24/2020) 42 tablet 0   No current facility-administered medications for this visit.     Musculoskeletal: Strength & Muscle Tone: UTA Gait & Station: UTA Patient leans: N/A  Psychiatric Specialty Exam: Review of Systems  Psychiatric/Behavioral: Positive for sleep disturbance.  All other systems reviewed and are negative.   Last menstrual period 11/26/2018.There is no height or weight on file to calculate BMI.  General Appearance: Casual  Eye Contact:  Fair  Speech:  Clear and Coherent  Volume:  Normal  Mood:  Euthymic  Affect:  Congruent  Thought Process:  Goal Directed and Descriptions of Associations: Intact  Orientation:  Full (Time, Place, and Person)  Thought Content: Logical   Suicidal Thoughts:  No  Homicidal Thoughts:  No  Memory:  Immediate;   Fair Recent;   Fair Remote;   Fair reports short term memory loss, episodes of confusion  Judgement:  Fair  Insight:  Shallow  Psychomotor Activity:  Normal  Concentration:  Concentration: Fair and Attention Span: Fair  Recall:  [employer] of Knowledge: Fair  Language: Fair  Akathisia:  No  Handed:  Right  AIMS (if indicated): UTA  Assets:  Communication Skills Desire for Improvement Housing Social Support  ADL's:  Intact  Cognition:  WNL  Sleep:  Poor   Screenings: AIMS   Flowsheet Row Office Visit from 04/07/2018 in [facility] Office Visit from 01/01/2018 in [facility] Total Score 10 1    [place] Office Visit from 08/25/2019 in [place] Visit from 12/16/2018 in [facility] Office Visit from 09/06/2017 in [facility]  Total GAD-7 Score $RemoveBef'8 8 8    'YSFKYfxHPB$ PHQ2-9   [place] from 01/08/2020 in [facility] Visit from 12/15/2019 in [place] Visit from 08/25/2019 in [facility] Visit from 01/09/2019 in [facility] Office Visit from 12/30/2018 in [place]  PHQ-2 Total Score $RemoveBef'6 2 6 'HCeToAyBqn$ 0 0  PHQ-9 Total Score $RemoveBef'14 12 17 'TqHnHNLotC$ 0 2       Assessment and Plan: Neko Mcgeehan is a 45 year old Caucasian female who has a history of bipolar disorder, PTSD, panic disorder, drug-induced parkinsonian symptoms, diabetes mellitus, polysubstance abuse was evaluated by telemedicine today.  Patient is biologically predisposed given history of trauma, multiple medical problems including mental health problems in her family, past history of substance abuse problems.  Patient continues to struggle with sleep problems as well as cognitive issues and will benefit from the following plan.  Plan Bipolar disorder mixed with psychosis in remission Continue lithium 300 mg p.o. daily in the morning and 450 mg p.o. daily with supper Continue Zoloft as prescribed. Continue Zyprexa 20 mg p.o. nightly  Panic attacks-stable Zoloft 50 mg p.o. daily  PTSD-stable Zoloft 50 mg p.o. daily  Insomnia- unstable Patient reports episodes of not being able to sleep at night and also confusion, sleepwalking. Patient however reports she is aware of what happens during her sleepwalking episodes, hence unknown if she is truly sleepwalking or not. We will refer patient for sleep study. Continue trazodone 100 mg p.o. nightly. Zyprexa 20 mg p.o. nightly.   Neuroleptic induced Parkinson's disease-stable Cogentin 1 mg p.o. 3 times daily  Alcohol use disorder/cocaine and cannabis use disorder in remission Continue to monitor closely  Cognitive disorder-advised patient to follow-up with neurology.  Patient agrees to do so.  High risk medication use-we will order lithium level, BUN/creatinine level.  Patient to go to [facility] lab to get levels completed.  Follow-up in clinic in 3-4 weeks or sooner if needed.  I have spent atleast 30 minutes non face to face with  patient today. More than 50 % of the time was spent for preparing to see the patient ( e.g., review of test, records ), obtaining and to review and separately obtained history , ordering medications and test ,psychoeducation and supportive psychotherapy and care coordination,as well as documenting clinical information in electronic health record. This note was generated in part or whole with voice recognition software. Voice recognition is usually quite accurate but there are transcription errors that can and very often do occur. I apologize for any typographical errors that were not detected and corrected.      Ursula Alert, MD 02/25/2020, 9:31 AM

## 2020-02-25 ENCOUNTER — Ambulatory Visit (INDEPENDENT_AMBULATORY_CARE_PROVIDER_SITE_OTHER): Payer: Medicare HMO | Admitting: Nurse Practitioner

## 2020-02-25 ENCOUNTER — Encounter: Payer: Self-pay | Admitting: Nurse Practitioner

## 2020-02-25 VITALS — BP 113/72 | HR 88 | Temp 97.6°F | Ht 62.44 in | Wt 171.0 lb

## 2020-02-25 DIAGNOSIS — R21 Rash and other nonspecific skin eruption: Secondary | ICD-10-CM | POA: Diagnosis not present

## 2020-02-25 MED ORDER — TRIAMCINOLONE ACETONIDE 0.1 % EX CREA
1.0000 | TOPICAL_CREAM | Freq: Two times a day (BID) | CUTANEOUS | 2 refills | Status: DC
Start: 2020-02-25 — End: 2020-03-28

## 2020-02-25 NOTE — Patient Instructions (Signed)
Rash, Adult  A rash is a change in the color of your skin. A rash can also change the way your skin feels. There are many different conditions and factors that can cause a rash. Follow these instructions at home: The goal of treatment is to stop the itching and keep the rash from spreading. Watch for any changes in your symptoms. Let your doctor know about them. Follow these instructions to help with your condition: Medicine Take or apply over-the-counter and prescription medicines only as told by your doctor. These may include medicines:  To treat red or swollen skin (corticosteroid creams).  To treat itching.  To treat an allergy (oral antihistamines).  To treat very bad symptoms (oral corticosteroids).   Skin care  Put cool cloths (compresses) on the affected areas.  Do not scratch or rub your skin.  Avoid covering the rash. Make sure that the rash is exposed to air as much as possible. Managing itching and discomfort  Avoid hot showers or baths. These can make itching worse. A cold shower may help.  Try taking a bath with: ? Epsom salts. You can get these at your local pharmacy or grocery store. Follow the instructions on the package. ? Baking soda. Pour a small amount into the bath as told by your doctor. ? Colloidal oatmeal. You can get this at your local pharmacy or grocery store. Follow the instructions on the package.  Try putting baking soda paste onto your skin. Stir water into baking soda until it gets like a paste.  Try putting on a lotion that relieves itchiness (calamine lotion).  Keep cool and out of the sun. Sweating and being hot can make itching worse. General instructions  Rest as needed.  Drink enough fluid to keep your pee (urine) pale yellow.  Wear loose-fitting clothing.  Avoid scented soaps, detergents, and perfumes. Use gentle soaps, detergents, perfumes, and other cosmetic products.  Avoid anything that causes your rash. Keep a journal to help  track what causes your rash. Write down: ? What you eat. ? What cosmetic products you use. ? What you drink. ? What you wear. This includes jewelry.  Keep all follow-up visits as told by your doctor. This is important.   Contact a doctor if:  You sweat at night.  You lose weight.  You pee (urinate) more than normal.  You pee less than normal, or you notice that your pee is a darker color than normal.  You feel weak.  You throw up (vomit).  Your skin or the whites of your eyes look yellow (jaundice).  Your skin: ? Tingles. ? Is numb.  Your rash: ? Does not go away after a few days. ? Gets worse.  You are: ? More thirsty than normal. ? More tired than normal.  You have: ? New symptoms. ? Pain in your belly (abdomen). ? A fever. ? Watery poop (diarrhea). Get help right away if:  You have a fever and your symptoms suddenly get worse.  You start to feel mixed up (confused).  You have a very bad headache or a stiff neck.  You have very bad joint pains or stiffness.  You have jerky movements that you cannot control (seizure).  Your rash covers all or most of your body. The rash may or may not be painful.  You have blisters that: ? Are on top of the rash. ? Grow larger. ? Grow together. ? Are painful. ? Are inside your nose or mouth.  You have  a rash that: ? Looks like purple pinprick-sized spots all over your body. ? Has a "bull's eye" or looks like a target. ? Is red and painful, causes your skin to peel, and is not from being in the sun too long. Summary  A rash is a change in the color of your skin. A rash can also change the way your skin feels.  The goal of treatment is to stop the itching and keep the rash from spreading.  Take or apply over-the-counter and prescription medicines only as told by your doctor.  Contact a doctor if you have new symptoms or symptoms that get worse.  Keep all follow-up visits as told by your doctor. This is  important. This information is not intended to replace advice given to you by your health care provider. Make sure you discuss any questions you have with your health care provider. Document Revised: 05/16/2018 Document Reviewed: 08/26/2017 Elsevier Patient Education  2021 [employer].

## 2020-02-25 NOTE — Assessment & Plan Note (Signed)
Ongoing, with minimal improvement.  Prednisone taper and Triamcinolone tried without benefit.  Recommend she continue daily Zyrtec and avoid scratching at area, may use Benadryl cream.  Referral to dermatology for further assessment and recommendations, refill on cream sent.  Continue current at home regimen.  Return in 6 weeks for chronic disease visit.

## 2020-02-25 NOTE — Progress Notes (Signed)
BP 113/72   Pulse 88   Temp 97.6 F (36.4 C) (Oral)   Ht 5' 2.44" (1.586 m)   Wt 171 lb (77.6 kg)   LMP 11/26/2018 Comment: pt denies pregnancy-states no period in two years  SpO2 97%   BMI 30.84 kg/m    Subjective:    Patient ID: Vanessa Baker, female    DOB: 1975/12/03, 45 y.o.   MRN: [id number]  HPI: Vanessa Baker is a 45 y.o. female  Chief Complaint  Patient presents with  . Rash    For about a month.    RASH Follow-up for rash noted at visit in November.  Using Triamcinolone cream and Prednisone taper added last visit.  Reports initially the cream benefited and Benadryl, which is no longer helping.  Is taking daily Zyrtec as well.   Duration:weeks Location:arms Itching:yes Burning:no Redness:yes Oozing:no Scaling:yes Blisters:no Painful:no Fevers:no Change in detergents/soaps/personal care products:no Recent illness:no Recent travel:no History of same:no Context:fluctuating Alleviating factors:Zyrtec and Calamine lotion -- but do not help Treatments attempted:Zyrtec and Calamine., hydrocortisone cream and benadryl Shortness of breath:no Throat/tongue swelling:no Myalgias/arthralgias:no  Relevant past medical, surgical, family and social history reviewed and updated as indicated. Interim medical history since our last visit reviewed. Allergies and medications reviewed and updated.  Review of Systems  Constitutional: Negative for activity change, appetite change, diaphoresis, fatigue and fever.  Respiratory: Negative for cough, chest tightness and shortness of breath.   Cardiovascular: Negative for chest pain, palpitations and leg swelling.  Gastrointestinal: Negative.   Endocrine: Negative for cold intolerance, heat intolerance, polydipsia, polyphagia and polyuria.  Skin: Positive for rash.  Neurological: Negative.   Psychiatric/Behavioral: Negative.     Per HPI unless specifically indicated above      Objective:    BP 113/72   Pulse 88   Temp 97.6 F (36.4 C) (Oral)   Ht 5' 2.44" (1.586 m)   Wt 171 lb (77.6 kg)   LMP 11/26/2018 Comment: pt denies pregnancy-states no period in two years  SpO2 97%   BMI 30.84 kg/m   Wt Readings from Last 3 Encounters:  02/25/20 171 lb (77.6 kg)  02/01/20 180 lb (81.6 kg)  01/14/20 170 lb (77.1 kg)    Physical Exam Vitals and nursing note reviewed.  Constitutional:      General: She is awake. She is not in acute distress.    Appearance: She is well-developed and well-groomed. She is obese. She is not ill-appearing.  HENT:     Head: Normocephalic.     Right Ear: Hearing, tympanic membrane, ear canal and external ear normal.     Left Ear: Hearing, tympanic membrane, ear canal and external ear normal.     Nose: Nose normal.     Mouth/Throat:     Mouth: Mucous membranes are moist.  Eyes:     General: Lids are normal.        Right eye: No discharge.        Left eye: No discharge.     Extraocular Movements: Extraocular movements intact.     Conjunctiva/sclera: Conjunctivae normal.     Pupils: Pupils are equal, round, and reactive to light.     Visual Fields: Right eye visual fields normal.  Neck:     Thyroid: No thyromegaly.     Vascular: No carotid bruit.  Cardiovascular:     Rate and Rhythm: Normal rate and regular rhythm.     Heart sounds: Normal heart sounds. No murmur heard. No gallop.  Pulmonary:     Effort: Pulmonary effort is normal. No accessory muscle usage or respiratory distress.     Breath sounds: Normal breath sounds.  Abdominal:     General: Bowel sounds are normal.     Palpations: Abdomen is soft. There is no hepatomegaly.     Tenderness: There is no abdominal tenderness.  Musculoskeletal:     Cervical back: Normal range of motion and neck supple.     Right lower leg: No edema.     Left lower leg: No edema.  Lymphadenopathy:     Cervical: No cervical adenopathy.  Skin:    General: Skin is warm and dry.      Findings: Rash present. Rash is macular. Rash is not vesicular.     Comments: Macular rash scattered to lower extremities and upper extremities.  No vesicles or drainage.  No scaling.  Neurological:     Mental Status: She is alert and oriented to person, place, and time.     Gait: Gait is intact.     Deep Tendon Reflexes: Reflexes are normal and symmetric.     Reflex Scores:      Brachioradialis reflexes are 2+ on the right side and 2+ on the left side.      Patellar reflexes are 2+ on the right side and 2+ on the left side. Psychiatric:        Attention and Perception: Attention normal.        Mood and Affect: Mood normal.        Speech: Speech normal.        Behavior: Behavior normal. Behavior is cooperative.        Thought Content: Thought content normal.     Results for orders placed or performed during the hospital encounter of 01/14/20  Lithium level  Result Value Ref Range   Lithium Lvl 0.83 0.60 - 1.20 mmol/L  Comprehensive metabolic panel  Result Value Ref Range   Sodium 135 135 - 145 mmol/L   Potassium 4.1 3.5 - 5.1 mmol/L   Chloride 102 98 - 111 mmol/L   CO2 21 (L) 22 - 32 mmol/L   Glucose, Bld 117 (H) 70 - 99 mg/dL   BUN 7 6 - 20 mg/dL   Creatinine, Ser 0.74 0.44 - 1.00 mg/dL   Calcium 9.2 8.9 - 10.3 mg/dL   Total Protein 7.0 6.5 - 8.1 g/dL   Albumin 4.0 3.5 - 5.0 g/dL   AST 27 15 - 41 U/L   ALT 18 0 - 44 U/L   Alkaline Phosphatase 64 38 - 126 U/L   Total Bilirubin 0.6 0.3 - 1.2 mg/dL   GFR, Estimated >60 >60 mL/min   Anion gap 12 5 - 15  Salicylate level  Result Value Ref Range   Salicylate Lvl <1.3 (L) 7.0 - 30.0 mg/dL  Acetaminophen level  Result Value Ref Range   Acetaminophen (Tylenol), Serum <10 (L) 10 - 30 ug/mL  cbc  Result Value Ref Range   WBC 10.3 4.0 - 10.5 K/uL   RBC 4.68 3.87 - 5.11 MIL/uL   Hemoglobin 13.4 12.0 - 15.0 g/dL   HCT 41.7 36.0 - 46.0 %   MCV 89.1 80.0 - 100.0 fL   MCH 28.6 26.0 - 34.0 pg   MCHC 32.1 30.0 - 36.0 g/dL   RDW  13.1 11.5 - 15.5 %   Platelets 330 150 - 400 K/uL   nRBC 0.0 0.0 - 0.2 %      Assessment &  Plan:   Problem List Items Addressed This Visit      Musculoskeletal and Integument   Rash - Primary    Ongoing, with minimal improvement.  Prednisone taper and Triamcinolone tried without benefit.  Recommend she continue daily Zyrtec and avoid scratching at area, may use Benadryl cream.  Referral to dermatology for further assessment and recommendations, refill on cream sent.  Continue current at home regimen.  Return in 6 weeks for chronic disease visit.      Relevant Orders   Ambulatory referral to Dermatology       Follow up plan: Return in about 6 weeks (around 04/07/2020) for T2DM, HTN/HLD, MOOD, B12, RLS.

## 2020-02-26 ENCOUNTER — Telehealth: Payer: Self-pay | Admitting: Licensed Clinical Social Worker

## 2020-02-26 ENCOUNTER — Telehealth: Payer: Medicare HMO

## 2020-02-26 NOTE — Telephone Encounter (Signed)
Chronic Care Management    Clinical Social Work General Follow Up Note  02/26/2020 Name: Vanessa Baker MRN: [id number] DOB: 1975-11-13  Vanessa Baker is a 45 y.o. year old female who is a primary care patient of Cannady, Barbaraann Faster, NP. The CCM team was consulted for assistance with [employer] .   Review of patient status, including review of consultants reports, relevant laboratory and other test results, and collaboration with appropriate care team members and the patient's provider was performed as part of comprehensive patient evaluation and provision of chronic care management services.    LCSW completed CCM outreach attempt today but was unable to reach patient successfully. A HIPPA compliant voice message was left encouraging patient to return call once available. LCSW will reschedule patient's CCM Social Work appointment if no return call has been made.  A HIPPA compliant phone message was left for the patient providing contact information and requesting a return call.   Advanced Directives Status: <no information> See Care Plan for related entries.   Outpatient Encounter Medications as of 02/26/2020  Medication Sig  . ACCU-CHEK AVIVA PLUS test strip USE 1 STRIP TO CHECK GLUCOSE UP TO 3 TIMES DAILY AS DIRECTED.  . Accu-Chek Softclix Lancets lancets Use to check blood sugar up to 3 times daily as instructed  . albuterol (VENTOLIN HFA) 108 (90 Base) MCG/ACT inhaler Inhale 2 puffs into the lungs every 6 (six) hours as needed for wheezing or shortness of breath.  Marland Kitchen amantadine (SYMMETREL) 100 MG capsule Take by mouth at bedtime.  Marland Kitchen atorvastatin (LIPITOR) 40 MG tablet Take 1 tablet (40 mg total) by mouth daily.  . baclofen (LIORESAL) 10 MG tablet Take 1 tablet (10 mg total) by mouth 3 (three) times daily as needed for muscle spasms.  . benztropine (COGENTIN) 1 MG tablet Take 1 tablet (1 mg total) by mouth 3 (three) times daily.  . blood glucose meter kit and  supplies Dispense based on patient and insurance preference. Use up to four times daily as directed. (FOR ICD-10 E10.9, E11.9).  Marland Kitchen Blood Glucose Monitoring Suppl (ACCU-CHEK AVIVA PLUS) w/Device KIT Use to check blood sugar up to 3 times daily  . Calcium Carb-Cholecalciferol (CALCIUM 600+D3 PO) Take 1 tablet by mouth daily.  . cetirizine (ZYRTEC) 10 MG tablet Take 1 tablet (10 mg total) by mouth daily as needed for allergies (itching).  . Dulaglutide (TRULICITY) 1.5 UK/0.2RK SOPN Inject 1.5 mg into the skin every 7 (seven) days.  . fluticasone-salmeterol (ADVAIR HFA) 115-21 MCG/ACT inhaler Inhale 2 puffs into the lungs 2 (two) times daily.  Marland Kitchen gabapentin (NEURONTIN) 300 MG capsule Take 1 capsule (300 mg total) by mouth 2 (two) times daily. For restless legs  . lisinopril (ZESTRIL) 10 MG tablet Take 1 tablet (10 mg total) by mouth daily.  Marland Kitchen lithium 300 MG tablet Take 300 mg by mouth in the morning and at bedtime.  Marland Kitchen lithium carbonate 150 MG capsule Take 150 mg by mouth daily. Take along with 300 mg at bedtime  . metFORMIN (GLUCOPHAGE) 500 MG tablet Take 1 tablet (500 mg total) by mouth 2 (two) times daily.  . Methylcobalamin (B-12) 1000 MCG TBDP Take 1,000 mcg by mouth daily.  . norethindrone-ethinyl estradiol (JUNEL FE 1/20) 1-20 MG-MCG tablet Take 1 tablet by mouth daily.  Marland Kitchen OLANZapine (ZYPREXA) 20 MG tablet Take 1 tablet (20 mg total) by mouth at bedtime.  Marland Kitchen omeprazole (PRILOSEC) 20 MG capsule Take 1 capsule (20 mg total) by mouth daily.  Marland Kitchen  primidone (MYSOLINE) 50 MG tablet Take 1 tablet (50 mg total) by mouth at bedtime.  . sertraline (ZOLOFT) 50 MG tablet Take 1 tablet (50 mg total) by mouth daily.  Marland Kitchen tiZANidine (ZANAFLEX) 4 MG tablet   . traZODone (DESYREL) 100 MG tablet TAKE 1 & 1/2 (ONE & ONE-HALF) TABLETS BY MOUTH AT BEDTIME FOR SLEEP  . triamcinolone (KENALOG) 0.1 % Apply 1 application topically 2 (two) times daily.   No facility-administered encounter medications on file as of 02/26/2020.     Follow Up Plan: SW will follow up with patient by phone over the next 45 days   Eula Fried, [place], MSW, [email or web address] Phone: [phone]

## 2020-03-06 DIAGNOSIS — J9601 Acute respiratory failure with hypoxia: Secondary | ICD-10-CM | POA: Diagnosis not present

## 2020-03-06 DIAGNOSIS — J4541 Moderate persistent asthma with (acute) exacerbation: Secondary | ICD-10-CM | POA: Diagnosis not present

## 2020-03-06 DIAGNOSIS — C3400 Malignant neoplasm of unspecified main bronchus: Secondary | ICD-10-CM | POA: Diagnosis not present

## 2020-03-13 ENCOUNTER — Other Ambulatory Visit: Payer: Self-pay | Admitting: Psychiatry

## 2020-03-13 DIAGNOSIS — G2111 Neuroleptic induced parkinsonism: Secondary | ICD-10-CM

## 2020-03-13 DIAGNOSIS — F3130 Bipolar disorder, current episode depressed, mild or moderate severity, unspecified: Secondary | ICD-10-CM

## 2020-03-13 DIAGNOSIS — F4310 Post-traumatic stress disorder, unspecified: Secondary | ICD-10-CM

## 2020-03-13 DIAGNOSIS — F315 Bipolar disorder, current episode depressed, severe, with psychotic features: Secondary | ICD-10-CM

## 2020-03-16 ENCOUNTER — Ambulatory Visit: Payer: Medicare HMO | Admitting: Nurse Practitioner

## 2020-03-17 ENCOUNTER — Ambulatory Visit: Payer: Medicare HMO | Admitting: Dermatology

## 2020-03-21 ENCOUNTER — Other Ambulatory Visit
Admission: RE | Admit: 2020-03-21 | Discharge: 2020-03-21 | Disposition: A | Discharge status: Home / Self Care | Payer: Medicare HMO | Source: Ambulatory Visit | Attending: Psychiatry | Admitting: Psychiatry

## 2020-03-21 DIAGNOSIS — Z79899 Other long term (current) drug therapy: Secondary | ICD-10-CM | POA: Diagnosis not present

## 2020-03-21 LAB — BASIC METABOLIC PANEL
Anion gap: 13 (ref 5–15)
BUN: <5 mg/dL — ABNORMAL LOW (ref 6–20)
CO2: 22 mmol/L (ref 22–32)
Calcium: 8.9 mg/dL (ref 8.9–10.3)
Chloride: 99 mmol/L (ref 98–111)
Creatinine, Ser: 1 mg/dL (ref 0.44–1.00)
GFR, Estimated: >60 mL/min (ref >60)
Glucose, Bld: 258 mg/dL — ABNORMAL HIGH (ref 70–99)
Potassium: 3.8 mmol/L (ref 3.5–5.1)
Sodium: 134 mmol/L — ABNORMAL LOW (ref 135–145)

## 2020-03-21 LAB — LITHIUM LEVEL: Lithium Lvl: 0.35 mmol/L — ABNORMAL LOW (ref 0.60–1.20)

## 2020-03-21 NOTE — Addendum Note (Signed)
Addended by: Santiago Bur on: 03/21/2020 09:37 AM   Modules accepted: Orders

## 2020-03-21 NOTE — Addendum Note (Signed)
Addended by: Santiago Bur on: 03/21/2020 09:38 AM   Modules accepted: Orders

## 2020-03-22 ENCOUNTER — Other Ambulatory Visit: Payer: Self-pay

## 2020-03-22 ENCOUNTER — Telehealth (INDEPENDENT_AMBULATORY_CARE_PROVIDER_SITE_OTHER): Payer: Medicare HMO | Admitting: Psychiatry

## 2020-03-22 ENCOUNTER — Encounter: Payer: Self-pay | Admitting: Psychiatry

## 2020-03-22 DIAGNOSIS — G2111 Neuroleptic induced parkinsonism: Secondary | ICD-10-CM

## 2020-03-22 DIAGNOSIS — F1421 Cocaine dependence, in remission: Secondary | ICD-10-CM

## 2020-03-22 DIAGNOSIS — G2581 Restless legs syndrome: Secondary | ICD-10-CM | POA: Diagnosis not present

## 2020-03-22 DIAGNOSIS — F3176 Bipolar disorder, in full remission, most recent episode depressed: Secondary | ICD-10-CM | POA: Diagnosis not present

## 2020-03-22 DIAGNOSIS — F4310 Post-traumatic stress disorder, unspecified: Secondary | ICD-10-CM | POA: Diagnosis not present

## 2020-03-22 DIAGNOSIS — F1211 Cannabis abuse, in remission: Secondary | ICD-10-CM

## 2020-03-22 DIAGNOSIS — Z79899 Other long term (current) drug therapy: Secondary | ICD-10-CM | POA: Diagnosis not present

## 2020-03-22 DIAGNOSIS — F1011 Alcohol abuse, in remission: Secondary | ICD-10-CM

## 2020-03-22 DIAGNOSIS — R69 Illness, unspecified: Secondary | ICD-10-CM | POA: Diagnosis not present

## 2020-03-22 MED ORDER — LITHIUM CARBONATE 300 MG PO TABS: 300.0000 mg | ORAL_TABLET | Freq: Two times a day (BID) | ORAL | 0 refills | Status: DC

## 2020-03-22 MED ORDER — SERTRALINE HCL 50 MG PO TABS: 50.0000 mg | ORAL_TABLET | Freq: Every day | ORAL | 0 refills | Status: DC

## 2020-03-22 NOTE — Progress Notes (Signed)
Virtual Visit via Telephone Note  I connected with Vanessa Baker on 03/22/20 at 11:00 AM EST by telephone and verified that I am speaking with the correct person using two identifiers.  Location Provider Location : [facility] Patient Location : Home  Participants: Patient ,Spouse, Provider     I discussed the limitations, risks, security and privacy concerns of performing an evaluation and management service by telephone and the availability of in person appointments. I also discussed with the patient that there may be a patient responsible charge related to this service. The patient expressed understanding and agreed to proceed.   I discussed the assessment and treatment plan with the patient. The patient was provided an opportunity to ask questions and all were answered. The patient agreed with the plan and demonstrated an understanding of the instructions.   The patient was advised to call back or seek an in-person evaluation if the symptoms worsen or if the condition fails to improve as anticipated.  [place] MD OP Progress Note  03/22/2020 8:57 PM Sam Vanessa Baker  MRN:  [id number]  Chief Complaint:  Chief Complaint    Follow-up     HPI: Vanessa Baker is a 45 year old Caucasian female, divorced, has a history of bipolar disorder, PTSD, neuroleptic induced parkinsonian syndrome, tobacco use disorder, cocaine use disorder, alcohol use disorder, hyperlipidemia, IBS was evaluated by telemedicine today.  Patient today reports she is currently making progress with regards to her mood.  Denies any significant depression or mood swings.  She is tolerating the medications well.  Patient however reports she continues to struggle with sleep problems.  Patient with history of sleepwalking was referred for sleep study.  Patient reports she never got a chance to get in touch with sleep clinic since she was busy and forgot all about it.  Patient continues to have memory changes,  forgetfulness.  She also has been noncompliant with neurology visits.  Provided education. She agrees to get in touch.  Patient denies any suicidality, homicidality or perceptual disturbances.  She denies any substance abuse problems.  She reports she is currently struggling with flu like symptoms however she does not have COVID-19.  She will get in touch with her primary care provider for continued management.  Patient denies any other concerns today.  Visit Diagnosis:    ICD-10-CM   1. Bipolar disorder, in full remission, most recent episode depressed ([place])  F31.76 lithium 300 MG tablet  2. PTSD (post-traumatic stress disorder)  F43.10 sertraline (ZOLOFT) 50 MG tablet   improving  3. Neuroleptic induced parkinsonism ([place])  G21.11   4. Restless leg syndrome  G25.81   5. Cocaine use disorder, moderate, in sustained remission (HCC)  F14.21   6. Alcohol use disorder, mild, in early remission  F10.11   7. Cannabis use disorder, mild, in early remission  F12.11   8. High risk medication use  Z79.899     Past Psychiatric History: I have reviewed past psychiatric history from my progress note on 04/02/2017.  Past trials of Seroquel, Latuda, Ambien, Klonopin, Topamax, gabapentin, Trintellix, Abilify, Rexulti, Artane  Past Medical History:  Past Medical History:  Diagnosis Date  . Allergy   . Anxiety   . Asthma   . Bipolar disorder ([place])   . Bursitis   . Depression   . Diabetes mellitus without complication ([place])   . Essential hypertension 03/28/2017  . Frequent headaches   . GERD (gastroesophageal reflux disease)   . Headache   . High cholesterol   .  Hypertension   . Irritable bowel syndrome (IBS)   . Irritable colon 02/23/2010  . Migraine 04/02/2017  . Obesity (BMI 30-39.9) 11/25/2013  . Seizure (HCC) y-1986  . Seizures (HCC)   . Tremor     Past Surgical History:  Procedure Laterality Date  . CHOLECYSTECTOMY N/A 04/27/2019   Procedure: LAPAROSCOPIC CHOLECYSTECTOMY;  Surgeon:  Cannon, Jennifer, MD;  Location: [facility] ORS;  Service: General;  Laterality: N/A;  . NO PAST SURGERIES      Family Psychiatric History: I have reviewed family psychiatric history from my progress note on 04/02/2017.  Family History:  Family History  Problem Relation Age of Onset  . Bipolar disorder Mother   . Schizophrenia Mother   . Hypertension Mother   . Diabetes Mother   . Cancer Mother   . Anxiety disorder Mother   . ADD / ADHD Mother   . Alcohol abuse Mother   . Drug abuse Mother   . Heart disease Mother   . Bipolar disorder Sister   . Anxiety disorder Sister   . Drug abuse Sister   . Bipolar disorder Brother   . Anxiety disorder Brother   . Drug abuse Brother   . Hypertension Father   . Cancer Father   . Diabetes Father   . Breast cancer Maternal Aunt   . Leukemia Maternal Aunt   . Breast cancer Paternal Aunt   . Breast cancer Maternal Grandmother   . Alcohol abuse Maternal Grandmother   . Bipolar disorder Maternal Grandmother   . Breast cancer Paternal Grandmother   . Parkinson's disease Paternal Grandmother   . Alcohol abuse Maternal Grandfather   . Prostate cancer Maternal Grandfather   . Alcohol abuse Maternal Uncle   . Multiple myeloma Maternal Uncle   . ADD / ADHD Son   . Throat cancer Maternal Aunt   . Tremor Neg Hx     Social History: I have reviewed social history from my progress note on 04/02/2017. Social History   Socioeconomic History  . Marital status: Married    Spouse name: dewey   . Number of children: 1  . Years of education: 14  . Highest education level: Associate degree: occupational, technical, or vocational program  Occupational History  . Occupation: disability  Tobacco Use  . Smoking status: Former Smoker    Packs/day: 0.50    Years: 27.00    Pack years: 13.50    Types: Cigarettes    Quit date: 04/26/2019    Years since quitting: 0.9  . Smokeless tobacco: Never Used  Vaping Use  . Vaping Use: Former  Substance and Sexual  Activity  . Alcohol use: Yes  . Drug use: No  . Sexual activity: Yes    Partners: Male    Birth control/protection: None  Other Topics Concern  . Not on file  Social History Narrative   Lives with husband, Dewey Wayne Dixon (on DPR)   Right-handed   Caffeine use: 1 cup coffee a day, 2 soft drinks per day   Social Determinants of Health   Financial Resource Strain: Medium Risk  . Difficulty of Paying Living Expenses: Somewhat hard  Food Insecurity: No Food Insecurity  . Worried About Running Out of Food in the Last Year: Never true  . Ran Out of Food in the Last Year: Never true  Transportation Needs: No Transportation Needs  . Lack of Transportation (Medical): No  . Lack of Transportation (Non-Medical): No  Physical Activity: Inactive  . Days of Exercise per   Week: 0 days  . Minutes of Exercise per Session: 0 min  Stress: Stress Concern Present  . Feeling of Stress : Very much  Social Connections: Moderately Isolated  . Frequency of Communication with Friends and Family: Three times a week  . Frequency of Social Gatherings with Friends and Family: Three times a week  . Attends Religious Services: Never  . Active Member of Clubs or Organizations: No  . Attends Archivist Meetings: Never  . Marital Status: Living with partner    Allergies:  Allergies  Allergen Reactions  . Amitriptyline Other (See Comments)    Confusion, paralysis  . Wellbutrin [Bupropion]     falls    Metabolic Disorder Labs: Lab Results  Component Value Date   HGBA1C 5.1 12/15/2019   MPG 111.15 07/08/2019   MPG 128.37 04/27/2019   Lab Results  Component Value Date   PROLACTIN 8.9 07/08/2019   PROLACTIN 4.6 (L) 04/22/2017   Lab Results  Component Value Date   CHOL 189 12/15/2019   TRIG 359 (H) 12/15/2019   HDL 34 (L) 12/15/2019   CHOLHDL 3.2 07/08/2019   VLDL 63 (H) 07/08/2019   LDLCALC 95 12/15/2019   LDLCALC 46 08/25/2019   Lab Results  Component Value Date   TSH 2.880  01/13/2020   TSH 1.340 12/08/2019    Therapeutic Level Labs: Lab Results  Component Value Date   LITHIUM 0.35 (L) 03/21/2020   LITHIUM 0.83 01/14/2020   Lab Results  Component Value Date   VALPROATE 103 (H) 04/26/2019   VALPROATE 61.4 08/13/2017   No components found for:  CBMZ  Current Medications: Current Outpatient Medications  Medication Sig Dispense Refill  . ACCU-CHEK AVIVA PLUS test strip USE 1 STRIP TO CHECK GLUCOSE UP TO 3 TIMES DAILY AS DIRECTED. 300 each 1  . Accu-Chek Softclix Lancets lancets Use to check blood sugar up to 3 times daily as instructed 300 each 1  . albuterol (VENTOLIN HFA) 108 (90 Base) MCG/ACT inhaler Inhale 2 puffs into the lungs every 6 (six) hours as needed for wheezing or shortness of breath. 18 g 4  . atorvastatin (LIPITOR) 40 MG tablet Take 1 tablet (40 mg total) by mouth daily. 90 tablet 4  . baclofen (LIORESAL) 10 MG tablet Take 1 tablet (10 mg total) by mouth 3 (three) times daily as needed for muscle spasms. 90 tablet 4  . benztropine (COGENTIN) 1 MG tablet TAKE 1 TABLET BY MOUTH THREE TIMES DAILY 270 tablet 0  . blood glucose meter kit and supplies Dispense based on patient and insurance preference. Use up to four times daily as directed. (FOR ICD-10 E10.9, E11.9). 1 each 0  . Blood Glucose Monitoring Suppl (ACCU-CHEK AVIVA PLUS) w/Device KIT Use to check blood sugar up to 3 times daily 1 kit 0  . Calcium Carb-Cholecalciferol (CALCIUM 600+D3 PO) Take 1 tablet by mouth daily.    . cetirizine (ZYRTEC) 10 MG tablet Take 1 tablet (10 mg total) by mouth daily as needed for allergies (itching). 30 tablet 0  . Dulaglutide (TRULICITY) 1.5 VX/7.9TJ SOPN Inject 1.5 mg into the skin every 7 (seven) days. 2 mL 0  . fluticasone-salmeterol (ADVAIR HFA) 115-21 MCG/ACT inhaler Inhale 2 puffs into the lungs 2 (two) times daily. 1 each 12  . gabapentin (NEURONTIN) 300 MG capsule Take 1 capsule (300 mg total) by mouth 2 (two) times daily. For restless legs 180  capsule 1  . lisinopril (ZESTRIL) 10 MG tablet Take 1 tablet (10 mg total)  by mouth daily. 30 tablet 0  . lithium 300 MG tablet Take 1 tablet (300 mg total) by mouth in the morning and at bedtime. 180 tablet 0  . lithium carbonate 150 MG capsule TAKE 1 CAPSULE BY MOUTH ONCE DAILY WITH  SUPPER  (TO  BE  COMBINED  WITH  300MG) 90 capsule 0  . metFORMIN (GLUCOPHAGE) 500 MG tablet Take 1 tablet (500 mg total) by mouth 2 (two) times daily. 60 tablet 0  . Methylcobalamin (B-12) 1000 MCG TBDP Take 1,000 mcg by mouth daily. 90 tablet 4  . norethindrone-ethinyl estradiol (JUNEL FE 1/20) 1-20 MG-MCG tablet Take 1 tablet by mouth daily. 84 tablet 0  . OLANZapine (ZYPREXA) 20 MG tablet TAKE 1 TABLET BY MOUTH AT BEDTIME 90 tablet 0  . omega-3 acid ethyl esters (LOVAZA) 1 g capsule Take 1 capsule by mouth 2 (two) times daily.    . omeprazole (PRILOSEC) 20 MG capsule Take 1 capsule (20 mg total) by mouth daily. 30 capsule 3  . primidone (MYSOLINE) 50 MG tablet Take 1 tablet (50 mg total) by mouth at bedtime. 90 tablet 4  . sertraline (ZOLOFT) 50 MG tablet Take 1 tablet (50 mg total) by mouth daily. 90 tablet 0  . tiZANidine (ZANAFLEX) 4 MG tablet     . traZODone (DESYREL) 100 MG tablet TAKE 1 & 1/2 (ONE & ONE-HALF) TABLETS BY MOUTH AT BEDTIME FOR SLEEP 135 tablet 0  . triamcinolone (KENALOG) 0.1 % Apply 1 application topically 2 (two) times daily. 30 g 2   No current facility-administered medications for this visit.     Musculoskeletal: Strength & Muscle Tone: UTA Gait & Station: UTA Patient leans: N/A  Psychiatric Specialty Exam: Review of Systems  HENT: Positive for congestion.   Psychiatric/Behavioral: Positive for sleep disturbance.  All other systems reviewed and are negative.   Last menstrual period 11/26/2018.There is no height or weight on file to calculate BMI.  General Appearance: UTA  Eye Contact:  UTA  Speech:  Clear and Coherent  Volume:  Normal  Mood:  Euthymic  Affect:  UTA   Thought Process:  Goal Directed and Descriptions of Associations: Intact  Orientation:  Full (Time, Place, and Person)  Thought Content: Logical   Suicidal Thoughts:  No  Homicidal Thoughts:  No  Memory:  Immediate;   Fair Recent;   Fair Remote;   Fair does report forgetfulness  Judgement:  Fair  Insight:  Fair  Psychomotor Activity:  UTA  Concentration:  Concentration: Fair and Attention Span: Fair  Recall:  Fair  Fund of Knowledge: Fair  Language: Fair  Akathisia:  No  Handed:  Right  AIMS (if indicated): UTA  Assets:  Communication Skills Desire for Improvement Housing Social Support  ADL's:  Intact  Cognition: WNL  Sleep:  Restless   Screenings: AIMS   Flowsheet Row Office Visit from 04/07/2018 in [facility] Office Visit from 01/01/2018 in [facility]  AIMS Total Score 10 1    GAD-7   Flowsheet Row Office Visit from 08/25/2019 in [facility] Office Visit from 12/16/2018 in [facility] Office Visit from 09/06/2017 in [facility]  Total GAD-7 Score 8 8 8    PHQ2-9   Flowsheet Row Video Visit from 03/22/2020 in [facility] Office Visit from 02/25/2020 in [facility] Clinical Support from 01/08/2020 in [facility] Office Visit from 12/15/2019 in [facility] Office Visit from 08/25/2019 in   [facility]  PHQ-2 Total Score 1 0 6 2 6  PHQ-9 Total Score - 11 14 12 17    Flowsheet Row Video Visit from 03/22/2020 in [facility]  C-SSRS RISK CATEGORY No Risk       Assessment and Plan: Aarika Graham Ar[place]ast is a 44-year-old Caucasian female who has a history of bipolar disorder, PTSD, panic disorder, drug-induced parkinsonian symptoms, diabetes melitis, polysubstance abuse was evaluated by telemedicine today.  Patient is biologically predisposed given history of trauma, multiple  medical problems including mental health problems in her family, past history of substance abuse problems.  Patient continues to struggle with memory changes, sleep problems however has been noncompliant with recommendations.  Discussed plan as noted below.  Plan Bipolar disorder in remission Lithium 300 mg p.o. daily in the morning and 450 mg p.o. daily with supper Continue Zoloft as prescribed Continue Zyprexa 20 mg p.o. nightly  Panic attacks-stable Zoloft 50 mg p.o. daily  PTSD-stable Zoloft 50 mg p.o. daily  Insomnia-unstable Patient with history of sleepwalking was referred for sleep study-she has been noncompliant.  Encouraged patient to get in touch with sleep clinic. Continue trazodone 100 mg p.o. nightly Zyprexa 20 mg p.o. daily  Neuroleptic induced Parkinson's disease-stable Cogentin 1 mg p.o. 3 times daily.  Will consider tapering it off gradually.  Alcohol use disorder/cocaine and cannabis use disorder in remission Will monitor closely  Cognitive disorder-patient advised to get in touch with neurology.  She was referred for follow-up.  Patient has been noncompliant.  Provided education.  High risk medication use-reviewed and discussed labs resulted 03/21/2020-lithium level-0.35-subtherapeutic.  Patient however reports her mood symptoms is currently stable and also reports she is compliant on lithium.  Will not make any additional changes today.  BMP-sodium-134-borderline low , blood glucose level-elevated at 258, BUN-low at 5-otherwise within normal limits.  Patient to follow-up with primary care provider for further management as needed.  Follow-up in clinic in 2 months or sooner if needed.  I have spent atleast 20 minutes non face to face with patient today. More than 50 % of the time was spent for preparing to see the patient ( e.g., review of test, records ), ordering medications and test ,psychoeducation and supportive psychotherapy and care coordination,as well as  documenting clinical information in electronic health record,interpreting and communication of test results This note was generated in part or whole with voice recognition software. Voice recognition is usually quite accurate but there are transcription errors that can and very often do occur. I apologize for any typographical errors that were not detected and corrected.       , MD 03/22/2020, 8:57 PM 

## 2020-03-28 ENCOUNTER — Other Ambulatory Visit: Payer: Self-pay

## 2020-03-28 ENCOUNTER — Encounter: Payer: Self-pay | Admitting: Nurse Practitioner

## 2020-03-28 ENCOUNTER — Ambulatory Visit (INDEPENDENT_AMBULATORY_CARE_PROVIDER_SITE_OTHER): Payer: Medicare HMO | Admitting: Nurse Practitioner

## 2020-03-28 VITALS — BP 102/69 | HR 87 | Temp 98.7°F | Wt 174.4 lb

## 2020-03-28 DIAGNOSIS — M79671 Pain in right foot: Secondary | ICD-10-CM | POA: Diagnosis not present

## 2020-03-28 DIAGNOSIS — R21 Rash and other nonspecific skin eruption: Secondary | ICD-10-CM | POA: Diagnosis not present

## 2020-03-28 MED ORDER — TRIAMCINOLONE ACETONIDE 0.1 % EX CREA
1.0000 | TOPICAL_CREAM | Freq: Two times a day (BID) | CUTANEOUS | 2 refills | Status: DC
Start: 2020-03-28 — End: 2021-02-19

## 2020-03-28 NOTE — Patient Instructions (Addendum)
[address], [place], [place] [address]   Foot Pain Many things can cause foot pain. Some common causes are:  An injury.  A sprain.  Arthritis.  Blisters.  Bunions. Follow these instructions at home: Managing pain, stiffness, and swelling If directed, put ice on the painful area:  Put ice in a plastic bag.  Place a towel between your skin and the bag.  Leave the ice on for 20 minutes, 2-3 times a day.   Activity  Do not stand or walk for long periods.  Return to your normal activities as told by your health care provider. Ask your health care provider what activities are safe for you.  Do stretches to relieve foot pain and stiffness as told by your health care provider.  Do not lift anything that is heavier than 10 lb (4.5 kg), or the limit that you are told, until your health care provider says that it is safe. Lifting a lot of weight can put added pressure on your feet. Lifestyle  Wear comfortable, supportive shoes that fit you well. Do not wear high heels.  Keep your feet clean and dry. General instructions  Take over-the-counter and prescription medicines only as told by your health care provider.  Rub your foot gently.  Pay attention to any changes in your symptoms.  Keep all follow-up visits as told by your health care provider. This is important. Contact a health care provider if:  Your pain does not get better after a few days of self-care.  Your pain gets worse.  You cannot stand on your foot. Get help right away if:  Your foot is numb or tingling.  Your foot or toes are swollen.  Your foot or toes turn white or blue.  You have warmth and redness along your foot. Summary  Common causes of foot pain are injury, sprain, arthritis, blisters or bunions.  Ice, medicines, and comfortable shoes may help foot pain.  Contact your health care provider if your pain does not get better after a few days of self-care. This information is not  intended to replace advice given to you by your health care provider. Make sure you discuss any questions you have with your health care provider. Document Revised: 11/07/2017 Document Reviewed: 11/07/2017 Elsevier Patient Education  2021 [employer].

## 2020-03-28 NOTE — Progress Notes (Signed)
BP 102/69   Pulse 87   Temp 98.7 F (37.1 C) (Oral)   Wt 174 lb 6.4 oz (79.1 kg)   LMP 11/26/2018 Comment: pt denies pregnancy-states no period in two years  SpO2 96%   BMI 31.45 kg/m    Subjective:    Patient ID: Vanessa Baker, female    DOB: 28-Mar-1975, 45 y.o.   MRN: [id number]  HPI: Vanessa Baker is a 45 y.o. female  Chief Complaint  Patient presents with  . Foot Injury    Pt states she fell on her foot, but her foot was bent backwards and states she has a rash/bite going down her right leg. Pt states she noticed the rash a couple of days ago. Pt states it itches but denies any burning    FOOT PAIN Reports she fell about one month ago prior to her medication being switched by psychiatry.  Twisted right ankle and foot a little with fall.  Just feeling discomfort to left side of foot.  Reports it is improving, but not 100%.  Has a rash to right lower leg she reports -- recently got over bed bugs in couch in home.  Duration: weeks Involved foot: right Mechanism of injury: trauma Location: medial Onset: sudden  Severity: 10/10  Quality: aching and dull Frequency: intermittent Radiation: no Aggravating factors: weight bearing, walking and movement  Alleviating factors: nothing  Status: stable Treatments attempted: rest, ice, APAP and ibuprofen  Relief with NSAIDs?:  mild Weakness with weight bearing or walking: no Morning stiffness: no Swelling: no Redness: no Bruising: no Paresthesias / decreased sensation: no  Fevers:no  Relevant past medical, surgical, family and social history reviewed and updated as indicated. Interim medical history since our last visit reviewed. Allergies and medications reviewed and updated.  Review of Systems  Constitutional: Negative.   Respiratory: Negative.   Cardiovascular: Negative.   Musculoskeletal: Positive for arthralgias.  Neurological: Negative.   Psychiatric/Behavioral: Negative.     Per HPI unless  specifically indicated above     Objective:    BP 102/69   Pulse 87   Temp 98.7 F (37.1 C) (Oral)   Wt 174 lb 6.4 oz (79.1 kg)   LMP 11/26/2018 Comment: pt denies pregnancy-states no period in two years  SpO2 96%   BMI 31.45 kg/m   Wt Readings from Last 3 Encounters:  03/28/20 174 lb 6.4 oz (79.1 kg)  02/25/20 171 lb (77.6 kg)  02/01/20 180 lb (81.6 kg)    Physical Exam Vitals and nursing note reviewed.  Constitutional:      General: She is awake. She is not in acute distress.    Appearance: She is well-developed and well-groomed. She is obese. She is not ill-appearing.  HENT:     Head: Normocephalic.     Right Ear: Hearing normal.     Left Ear: Hearing normal.  Eyes:     General: Lids are normal.        Right eye: No discharge.        Left eye: No discharge.     Conjunctiva/sclera: Conjunctivae normal.     Pupils: Pupils are equal, round, and reactive to light.  Neck:     Thyroid: No thyromegaly.     Vascular: No carotid bruit.  Cardiovascular:     Rate and Rhythm: Normal rate and regular rhythm.     Heart sounds: Normal heart sounds. No murmur heard. No gallop.   Pulmonary:     Effort: Pulmonary effort  is normal. No accessory muscle usage or respiratory distress.     Breath sounds: Normal breath sounds.  Abdominal:     General: Bowel sounds are normal.     Palpations: Abdomen is soft. There is no hepatomegaly or splenomegaly.  Musculoskeletal:     Cervical back: Normal range of motion and neck supple.     Right lower leg: No edema.     Left lower leg: No edema.     Right foot: Normal range of motion and normal capillary refill. Tenderness (to medial aspect of foot on palpation) present. No swelling, deformity, foot drop, laceration, bony tenderness or crepitus.     Left foot: Normal.  Skin:    General: Skin is warm and dry.     Findings: Rash present.     Comments: X 3 to right lower leg, raised, nummular in appearance rash with no vesicles and mild erythema  present to site.  Neurological:     Mental Status: She is alert and oriented to person, place, and time.  Psychiatric:        Attention and Perception: Attention normal.        Mood and Affect: Mood normal.        Speech: Speech normal.        Behavior: Behavior normal. Behavior is cooperative.        Thought Content: Thought content normal.     Results for orders placed or performed during the hospital encounter of 37/16/96  Basic metabolic panel  Result Value Ref Range   Sodium 134 (L) 135 - 145 mmol/L   Potassium 3.8 3.5 - 5.1 mmol/L   Chloride 99 98 - 111 mmol/L   CO2 22 22 - 32 mmol/L   Glucose, Bld 258 (H) 70 - 99 mg/dL   BUN <5 (L) 6 - 20 mg/dL   Creatinine, Ser 1.00 0.44 - 1.00 mg/dL   Calcium 8.9 8.9 - 10.3 mg/dL   GFR, Estimated >60 >60 mL/min   Anion gap 13 5 - 15  Lithium level  Result Value Ref Range   Lithium Lvl 0.35 (L) 0.60 - 1.20 mmol/L      Assessment & Plan:   Problem List Items Addressed This Visit      Musculoskeletal and Integument   Rash    To right lower leg, refills on Triamcinolone sent in.        Other   Right foot pain - Primary    Acute for weeks after twisting it.  Will obtain imaging to further view, due to tenderness being present to medial aspect.  Suspect more inflammation and bruising.  Recommend she rest and apply ice at least 3 times during day.  Wear compression support to foot and take Tylenol as needed.  Return to office in 4 weeks, sooner if worsening pain.      Relevant Orders   DG Foot Complete Right       Follow up plan: Return in about 4 weeks (around 04/25/2020) for FOOT PAIN FOLLOW-UP, T2DM, HTN/HLD, MOOD.

## 2020-03-28 NOTE — Assessment & Plan Note (Signed)
To right lower leg, refills on Triamcinolone sent in.

## 2020-03-28 NOTE — Assessment & Plan Note (Signed)
Acute for weeks after twisting it.  Will obtain imaging to further view, due to tenderness being present to medial aspect.  Suspect more inflammation and bruising.  Recommend she rest and apply ice at least 3 times during day.  Wear compression support to foot and take Tylenol as needed.  Return to office in 4 weeks, sooner if worsening pain.

## 2020-04-04 DIAGNOSIS — J9601 Acute respiratory failure with hypoxia: Secondary | ICD-10-CM | POA: Diagnosis not present

## 2020-04-04 DIAGNOSIS — C3400 Malignant neoplasm of unspecified main bronchus: Secondary | ICD-10-CM | POA: Diagnosis not present

## 2020-04-04 DIAGNOSIS — J4541 Moderate persistent asthma with (acute) exacerbation: Secondary | ICD-10-CM | POA: Diagnosis not present

## 2020-04-07 ENCOUNTER — Telehealth: Payer: Medicare HMO

## 2020-04-08 ENCOUNTER — Ambulatory Visit: Payer: Medicare HMO | Admitting: Nurse Practitioner

## 2020-04-09 ENCOUNTER — Other Ambulatory Visit: Payer: Self-pay | Admitting: Nurse Practitioner

## 2020-04-09 DIAGNOSIS — G2581 Restless legs syndrome: Secondary | ICD-10-CM

## 2020-04-09 NOTE — Telephone Encounter (Signed)
Requested Prescriptions  Pending Prescriptions Disp Refills   gabapentin (NEURONTIN) 300 MG capsule [Pharmacy Med Name: Gabapentin 300 MG Oral Capsule] 180 capsule 0    Sig: TAKE 1 CAPSULE BY MOUTH TWICE DAILY FOR  RESTLESS  LEGS     Neurology: Anticonvulsants - gabapentin Passed - 04/09/2020  9:36 AM      Passed - Valid encounter within last 12 months    Recent Outpatient Visits          1 week ago Right foot pain   [place], Henrine Screws T, NP   1 month ago [place], [place] T, NP   2 months ago Neuroleptic induced parkinsonism ([place])   [place], Barbaraann Faster, NP   2 months ago Memory changes   [place], [place] T, NP   3 months ago Type 2 diabetes mellitus with obesity ([place])   [place], Barbaraann Faster, NP      Future Appointments            In 2 weeks Cannady, Barbaraann Faster, NP [employer]   In 9 months  [facility]

## 2020-04-22 ENCOUNTER — Ambulatory Visit: Payer: Self-pay | Admitting: Licensed Clinical Social Worker

## 2020-04-22 ENCOUNTER — Telehealth: Payer: Self-pay | Admitting: Licensed Clinical Social Worker

## 2020-04-22 ENCOUNTER — Telehealth: Payer: Medicare HMO

## 2020-04-22 DIAGNOSIS — R1310 Dysphagia, unspecified: Secondary | ICD-10-CM

## 2020-04-22 DIAGNOSIS — G2111 Neuroleptic induced parkinsonism: Secondary | ICD-10-CM

## 2020-04-22 DIAGNOSIS — R413 Other amnesia: Secondary | ICD-10-CM

## 2020-04-22 DIAGNOSIS — E785 Hyperlipidemia, unspecified: Secondary | ICD-10-CM

## 2020-04-22 NOTE — Telephone Encounter (Addendum)
Chronic Care Management    Clinical Social Work General Follow Up Note  04/22/2020 Name: Vanessa Baker MRN: [id number] DOB: [birth year]  Vanessa Baker is a 45 y.o. year old female who is a primary care patient of Cannady, Barbaraann Faster, NP. The CCM team was consulted for assistance with [employer] .   Review of patient status, including review of consultants reports, relevant laboratory and other test results, and collaboration with appropriate care team members and the patient's provider was performed as part of comprehensive patient evaluation and provision of chronic care management services.    LCSW completed third and final CCM outreach attempt today but was unable to reach patient or spouse successfully. A HIPPA compliant voice message was left encouraging patient to return call once available. LCSW will close referral at this time due to inability to maintain contact with patient.   Outpatient Encounter Medications as of 04/22/2020  Medication Sig  . ACCU-CHEK AVIVA PLUS test strip USE 1 STRIP TO CHECK GLUCOSE UP TO 3 TIMES DAILY AS DIRECTED.  . Accu-Chek Softclix Lancets lancets Use to check blood sugar up to 3 times daily as instructed  . albuterol (VENTOLIN HFA) 108 (90 Base) MCG/ACT inhaler Inhale 2 puffs into the lungs every 6 (six) hours as needed for wheezing or shortness of breath.  Marland Kitchen atorvastatin (LIPITOR) 40 MG tablet Take 1 tablet (40 mg total) by mouth daily.  . baclofen (LIORESAL) 10 MG tablet Take 1 tablet (10 mg total) by mouth 3 (three) times daily as needed for muscle spasms.  . benztropine (COGENTIN) 1 MG tablet TAKE 1 TABLET BY MOUTH THREE TIMES DAILY  . blood glucose meter kit and supplies Dispense based on patient and insurance preference. Use up to four times daily as directed. (FOR ICD-10 E10.9, E11.9).  Marland Kitchen Blood Glucose Monitoring Suppl (ACCU-CHEK AVIVA PLUS) w/Device KIT Use to check blood sugar up to 3 times daily  . Calcium  Carb-Cholecalciferol (CALCIUM 600+D3 PO) Take 1 tablet by mouth daily.  . cetirizine (ZYRTEC) 10 MG tablet Take 1 tablet (10 mg total) by mouth daily as needed for allergies (itching).  . Dulaglutide (TRULICITY) 1.5 NU/2.7OZ SOPN Inject 1.5 mg into the skin every 7 (seven) days.  . fluticasone-salmeterol (ADVAIR HFA) 115-21 MCG/ACT inhaler Inhale 2 puffs into the lungs 2 (two) times daily.  Marland Kitchen gabapentin (NEURONTIN) 300 MG capsule TAKE 1 CAPSULE BY MOUTH TWICE DAILY FOR  RESTLESS  LEGS  . lisinopril (ZESTRIL) 10 MG tablet Take 1 tablet (10 mg total) by mouth daily.  Marland Kitchen lithium 300 MG tablet Take 1 tablet (300 mg total) by mouth in the morning and at bedtime.  Marland Kitchen lithium carbonate 150 MG capsule TAKE 1 CAPSULE BY MOUTH ONCE DAILY WITH  SUPPER  (TO  BE  COMBINED  WITH  $Rem'300MG'hZHf$ )  . metFORMIN (GLUCOPHAGE) 500 MG tablet Take 1 tablet (500 mg total) by mouth 2 (two) times daily.  . Methylcobalamin (B-12) 1000 MCG TBDP Take 1,000 mcg by mouth daily.  . norethindrone-ethinyl estradiol (JUNEL FE 1/20) 1-20 MG-MCG tablet Take 1 tablet by mouth daily.  Marland Kitchen OLANZapine (ZYPREXA) 20 MG tablet TAKE 1 TABLET BY MOUTH AT BEDTIME  . omega-3 acid ethyl esters (LOVAZA) 1 g capsule Take 1 capsule by mouth 2 (two) times daily.  Marland Kitchen omeprazole (PRILOSEC) 20 MG capsule Take 1 capsule (20 mg total) by mouth daily.  . primidone (MYSOLINE) 50 MG tablet Take 1 tablet (50 mg total) by mouth at bedtime.  . sertraline (ZOLOFT) 50  MG tablet Take 1 tablet (50 mg total) by mouth daily.  Marland Kitchen tiZANidine (ZANAFLEX) 4 MG tablet   . traZODone (DESYREL) 100 MG tablet TAKE 1 & 1/2 (ONE & ONE-HALF) TABLETS BY MOUTH AT BEDTIME FOR SLEEP  . triamcinolone (KENALOG) 0.1 % Apply 1 application topically 2 (two) times daily.   No facility-administered encounter medications on file as of 04/22/2020.    Follow Up Plan: LCSW has been unable to make contact with the patient for follow up. The care management team is available to follow up with the patient  after provider conversation with the patient regarding recommendation for care management engagement and subsequent re-referral to the care management team.   Eula Fried, BSW, MSW, [email or web address] Phone: [phone]
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
## 2020-04-25 ENCOUNTER — Ambulatory Visit: Payer: Medicare HMO | Admitting: Nurse Practitioner

## 2020-04-25 NOTE — Progress Notes (Deleted)
LMP 11/26/2018 Comment: pt denies pregnancy-states no period in two years   Subjective:    Patient ID: Vanessa Baker, female    DOB: 12-Jul-1975, 46 y.o.   MRN: [id number]  HPI: Vanessa Baker is a 45 y.o. female  No chief complaint on file.  FOOT PAIN Reports she fell about one month ago prior to her medication being switched by psychiatry.  Twisted right ankle and foot a little with fall.  Just feeling discomfort to left side of foot.  Reports it is improving, but not 100%.  XRAY of foot was ordered but patient did not get the imaging done.  Patient reports that her foot  DIABETES Hypoglycemic episodes:{Blank single:19197::"yes","no"} Polydipsia/polyuria: {Blank single:19197::"yes","no"} Visual disturbance: {Blank single:19197::"yes","no"} Chest pain: {Blank single:19197::"yes","no"} Paresthesias: {Blank single:19197::"yes","no"} Glucose Monitoring: {Blank single:19197::"yes","no"}  Accucheck frequency: {Blank single:19197::"Not Checking","Daily","BID","TID"}  Fasting glucose:  Post prandial:  Evening:  Before meals: Taking Insulin?: {Blank single:19197::"yes","no"}  Long acting insulin:  Short acting insulin: Blood Pressure Monitoring: {Blank single:19197::"not checking","rarely","daily","weekly","monthly","a few times a day","a few times a week","a few times a month"} Retinal Examination: {Blank single:19197::"Up to Date","Not up to Date"} Foot Exam: {Blank single:19197::"Up to Date","Not up to Date"} Diabetic Education: {Blank single:19197::"Completed","Not Completed"} Pneumovax: {Blank single:19197::"Up to Date","Not up to Date","unknown"} Influenza: {Blank single:19197::"Up to Date","Not up to Date","unknown"} Aspirin: {Blank single:19197::"yes","no"}  HYPERTENSION / HYPERLIPIDEMIA Satisfied with current treatment? {Blank single:19197::"yes","no"} Duration of hypertension: {Blank single:19197::"chronic","months","years"} BP monitoring frequency:  {Blank single:19197::"not checking","rarely","daily","weekly","monthly","a few times a day","a few times a week","a few times a month"} BP range:  BP medication side effects: {Blank single:19197::"yes","no"} Past BP meds: {Blank YKZLDJTT:01779::"TJQZ","ESPQZRAQTM","AUQJFHLKTG/YBWLSLHTDS","KAJGOTLX","BWIOMBTDHR","CBULAGTXMI/WOEH","OZYYQMGNOI (bystolic)","carvedilol","chlorthalidone","clonidine","diltiazem","exforge HCT","HCTZ","irbesartan (avapro)","labetalol","lisinopril","lisinopril-HCTZ","losartan (cozaar)","methyldopa","nifedipine","olmesartan (benicar)","olmesartan-HCTZ","quinapril","ramipril","spironalactone","tekturna","valsartan","valsartan-HCTZ","verapamil"} Duration of hyperlipidemia: {Blank single:19197::"chronic","months","years"} Cholesterol medication side effects: {Blank single:19197::"yes","no"} Cholesterol supplements: {Blank multiple:19196::"none","fish oil","niacin","red yeast rice"} Past cholesterol medications: {Blank multiple:19196::"none","atorvastain (lipitor)","lovastatin (mevacor)","pravastatin (pravachol)","rosuvastatin (crestor)","simvastatin (zocor)","vytorin","fenofibrate (tricor)","gemfibrozil","ezetimide (zetia)","niaspan","lovaza"} Medication compliance: {Blank single:19197::"excellent compliance","good compliance","fair compliance","poor compliance"} Aspirin: {Blank single:19197::"yes","no"} Recent stressors: {Blank single:19197::"yes","no"} Recurrent headaches: {Blank single:19197::"yes","no"} Visual changes: {Blank single:19197::"yes","no"} Palpitations: {Blank single:19197::"yes","no"} Dyspnea: {Blank single:19197::"yes","no"} Chest pain: {Blank single:19197::"yes","no"} Lower extremity edema: {Blank single:19197::"yes","no"} Dizzy/lightheaded: {Blank single:19197::"yes","no"}  MOOD Patient reports that she has been seeing psychiatry and her mood has been Relevant past medical, surgical, family and social history reviewed and updated as indicated. Interim  medical history since our last visit reviewed. Allergies and medications reviewed and updated.  Review of Systems  Per HPI unless specifically indicated above     Objective:    LMP 11/26/2018 Comment: pt denies pregnancy-states no period in two years  Wt Readings from Last 3 Encounters:  03/28/20 174 lb 6.4 oz (79.1 kg)  02/25/20 171 lb (77.6 kg)  02/01/20 180 lb (81.6 kg)    Physical Exam  Results for orders placed or performed during the hospital encounter of 37/04/88  Basic metabolic panel  Result Value Ref Range   Sodium 134 (L) 135 - 145 mmol/L   Potassium 3.8 3.5 - 5.1 mmol/L   Chloride 99 98 - 111 mmol/L   CO2 22 22 - 32 mmol/L   Glucose, Bld 258 (H) 70 - 99 mg/dL   BUN <5 (L) 6 - 20 mg/dL   Creatinine, Ser 1.00 0.44 - 1.00 mg/dL   Calcium 8.9 8.9 - 10.3 mg/dL   GFR, Estimated >60 >60 mL/min   Anion gap 13 5 - 15  Lithium level  Result Value Ref Range   Lithium Lvl 0.35 (L) 0.60 - 1.20 mmol/L      Assessment & Plan:   Problem List Items Addressed This Visit   None  Follow up plan: No follow-ups on file.

## 2020-05-04 DIAGNOSIS — J4541 Moderate persistent asthma with (acute) exacerbation: Secondary | ICD-10-CM | POA: Diagnosis not present

## 2020-05-04 DIAGNOSIS — C3400 Malignant neoplasm of unspecified main bronchus: Secondary | ICD-10-CM | POA: Diagnosis not present

## 2020-05-04 DIAGNOSIS — J9601 Acute respiratory failure with hypoxia: Secondary | ICD-10-CM | POA: Diagnosis not present
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
## 2020-05-12 ENCOUNTER — Telehealth (INDEPENDENT_AMBULATORY_CARE_PROVIDER_SITE_OTHER): Payer: Medicare HMO | Admitting: Psychiatry

## 2020-05-12 ENCOUNTER — Other Ambulatory Visit: Payer: Self-pay

## 2020-05-12 DIAGNOSIS — Z5329 Procedure and treatment not carried out because of patient's decision for other reasons: Secondary | ICD-10-CM

## 2020-05-12 NOTE — Progress Notes (Signed)
No response to call or text or video invite  

## 2020-05-24 ENCOUNTER — Other Ambulatory Visit: Payer: Self-pay | Admitting: Psychiatry

## 2020-05-24 DIAGNOSIS — F3176 Bipolar disorder, in full remission, most recent episode depressed: Secondary | ICD-10-CM

## 2020-05-24 DIAGNOSIS — F4310 Post-traumatic stress disorder, unspecified: Secondary | ICD-10-CM

## 2020-05-26 ENCOUNTER — Other Ambulatory Visit: Payer: Self-pay | Admitting: Nurse Practitioner

## 2020-05-26 DIAGNOSIS — E1165 Type 2 diabetes mellitus with hyperglycemia: Secondary | ICD-10-CM

## 2020-05-26 DIAGNOSIS — Z794 Long term (current) use of insulin: Secondary | ICD-10-CM

## 2020-05-26 MED ORDER — TRULICITY 1.5 MG/0.5ML ~~LOC~~ SOAJ: 1.5000 mg | SUBCUTANEOUS | 0 refills | Status: DC

## 2020-05-26 NOTE — Telephone Encounter (Signed)
Medication Refill - Medication:   Dulaglutide (TRULICITY) 1.5 LT/0.7DP SOPN   Has the patient contacted their pharmacy? Yes.  no refills left .  Preferred Pharmacy (with phone number or street name):   [facility] [address] [place]  [place] [address]  Phone: [phone] Fax: [phone]    Agent: Please be advised that RX refills may take up to 3 business days. We ask that you follow-up with your pharmacy.

## 2020-06-01 ENCOUNTER — Telehealth: Payer: Self-pay

## 2020-06-01 DIAGNOSIS — F3176 Bipolar disorder, in full remission, most recent episode depressed: Secondary | ICD-10-CM

## 2020-06-01 MED ORDER — LITHIUM CARBONATE 150 MG PO CAPS
ORAL_CAPSULE | ORAL | 0 refills | Status: DC
Start: 2020-06-01 — End: 2020-09-06

## 2020-06-01 MED ORDER — LITHIUM CARBONATE 300 MG PO TABS: 300.0000 mg | ORAL_TABLET | Freq: Two times a day (BID) | ORAL | 0 refills | Status: DC

## 2020-06-01 NOTE — Telephone Encounter (Signed)
I have sent lithium-30-day supply to pharmacy.  Patient needs an appointment completed prior to future refills.

## 2020-06-01 NOTE — Telephone Encounter (Signed)
pt called left message that she needed a refill on her lithium

## 2020-06-03 ENCOUNTER — Other Ambulatory Visit: Payer: Self-pay | Admitting: Nurse Practitioner

## 2020-06-03 ENCOUNTER — Telehealth: Payer: Self-pay | Admitting: Nurse Practitioner

## 2020-06-03 DIAGNOSIS — Z599 Problem related to housing and economic circumstances, unspecified: Secondary | ICD-10-CM

## 2020-06-03 DIAGNOSIS — F4310 Post-traumatic stress disorder, unspecified: Secondary | ICD-10-CM

## 2020-06-03 NOTE — Telephone Encounter (Signed)
Jolene, Please see the patient's request below entered in the [place].  Can you please enter a SDOH referral for patient for financial assistance? Also please enter a Embedded Chronic Care Management Services referral for pt to get scheduled with a Education officer, museum.   Copied from [employer] [phone]. Topic: General - Other >> Jun 01, 2020  3:07 PM Lennox Solders wrote: Reason for CRM: Pt is calling and would like to talk with social worker to see about getting help to pay her rent. Pt would like answer today or tomorrow. Pt is on the verge of getting evicted

## 2020-06-03 NOTE — Telephone Encounter (Signed)
Order has been placed.

## 2020-06-04 DIAGNOSIS — J4541 Moderate persistent asthma with (acute) exacerbation: Secondary | ICD-10-CM | POA: Diagnosis not present

## 2020-06-04 DIAGNOSIS — C3400 Malignant neoplasm of unspecified main bronchus: Secondary | ICD-10-CM | POA: Diagnosis not present

## 2020-06-04 DIAGNOSIS — J9601 Acute respiratory failure with hypoxia: Secondary | ICD-10-CM | POA: Diagnosis not present

## 2020-06-06 ENCOUNTER — Telehealth: Payer: Self-pay | Admitting: Licensed Clinical Social Worker

## 2020-06-06 ENCOUNTER — Telehealth: Payer: Self-pay | Admitting: *Deleted

## 2020-06-06 NOTE — Telephone Encounter (Signed)
   Telephone encounter was:  Unsuccessful.  06/06/2020 Name: Vanessa Baker MRN: [id number] DOB: September 20, 1975  Unsuccessful outbound call made today to assist with:  Transportation Needs , Food Insecurity and Financial Difficulties related to rent and eviction  Outreach Attempt:  1st Attempt  A HIPAA compliant voice message was left requesting a return call.  Instructed patient to call back [place], Care Management  [phone] [address] , [place] [address] Email : [email or web address]

## 2020-06-06 NOTE — Telephone Encounter (Signed)
    Clinical Social Work  Care Management   Phone Outreach    06/06/2020 Name: Vanessa Baker MRN: [id number] DOB: 04-Mar-1975  [name] is a 44 y.o. year old female who is a primary care patient of Cannady, Barbaraann Faster, NP .   CCM LCSW reached out to patient today by phone to introduce self, assess needs and offer Care Management services and interventions.    Telephone outreach was unsuccessful  Plan:CCM LCSW will wait for return call.  Review of patient status, including review of consultants reports, relevant laboratory and other test results, and collaboration with appropriate care team members and the patient's provider was performed as part of comprehensive patient evaluation and provision of care management services.     Christa See, MSW, [email or web address] Phone [phone] 9:10 AM

## 2020-06-10 ENCOUNTER — Telehealth: Payer: Self-pay | Admitting: *Deleted

## 2020-06-10 NOTE — Telephone Encounter (Signed)
   Telephone encounter was:  Unsuccessful.  06/10/2020 Name: Vanessa Baker MRN: [id number] DOB: 1975-02-08  Unsuccessful outbound call made today to assist with:  Transportation Needs , Food Insecurity and Financial Difficulties related to rent  Outreach Attempt:  2nd Attempt  A HIPAA compliant voice message was left requesting a return call.  Instructed patient to call back at [place], Care Management  [phone] [address] , [place] [address] Email : [email or web address]

## 2020-06-14 ENCOUNTER — Other Ambulatory Visit: Payer: Self-pay | Admitting: Nurse Practitioner

## 2020-06-14 NOTE — Telephone Encounter (Signed)
Requested medication (s) are due for refill today: no  Requested medication (s) are on the active medication list: yes  Last refill:  05/24/2020  Future visit scheduled: no  Notes to clinic:  One inhaler should last at least one month. If the patient is requesting refills earlier, contact the patient to check for uncontrolled symptoms   Requested Prescriptions  Pending Prescriptions Disp Refills   albuterol (VENTOLIN HFA) 108 (90 Base) MCG/ACT inhaler [Pharmacy Med Name: Albuterol Sulfate HFA 108 (90 Base) MCG/ACT Inhalation Aerosol Solution] 18 g 0    Sig: INHALE 2 PUFFS BY MOUTH EVERY 6 HOURS AS NEEDED FOR WHEEZING AND FOR SHORTNESS OF BREATH      Pulmonology:  Beta Agonists Failed - 06/14/2020  8:18 AM      Failed - One inhaler should last at least one month. If the patient is requesting refills earlier, contact the patient to check for uncontrolled symptoms.      Passed - Valid encounter within last 12 months    Recent Outpatient Visits           2 months ago Right foot pain   [place], [place] T, NP   3 months ago [place], [place] T, NP   4 months ago Neuroleptic induced parkinsonism ([place])   [place], Barbaraann Faster, NP   5 months ago Memory changes   [place], [place] T, NP   6 months ago Type 2 diabetes mellitus with obesity ([place])   [place], Barbaraann Faster, NP       Future Appointments             In 6 months [place], [facility]

## 2020-06-14 NOTE — Telephone Encounter (Signed)
Pt has 06/23/2020

## 2020-06-15 ENCOUNTER — Other Ambulatory Visit: Payer: Self-pay

## 2020-06-15 ENCOUNTER — Encounter: Payer: Self-pay | Admitting: Psychiatry

## 2020-06-15 ENCOUNTER — Telehealth: Payer: Self-pay | Admitting: *Deleted

## 2020-06-15 ENCOUNTER — Telehealth (INDEPENDENT_AMBULATORY_CARE_PROVIDER_SITE_OTHER): Payer: Medicare HMO | Admitting: Psychiatry

## 2020-06-15 DIAGNOSIS — Z79899 Other long term (current) drug therapy: Secondary | ICD-10-CM | POA: Diagnosis not present

## 2020-06-15 DIAGNOSIS — F1421 Cocaine dependence, in remission: Secondary | ICD-10-CM

## 2020-06-15 DIAGNOSIS — F3176 Bipolar disorder, in full remission, most recent episode depressed: Secondary | ICD-10-CM | POA: Diagnosis not present

## 2020-06-15 DIAGNOSIS — R69 Illness, unspecified: Secondary | ICD-10-CM | POA: Diagnosis not present

## 2020-06-15 DIAGNOSIS — G2111 Neuroleptic induced parkinsonism: Secondary | ICD-10-CM | POA: Diagnosis not present

## 2020-06-15 DIAGNOSIS — R413 Other amnesia: Secondary | ICD-10-CM

## 2020-06-15 DIAGNOSIS — F4310 Post-traumatic stress disorder, unspecified: Secondary | ICD-10-CM

## 2020-06-15 DIAGNOSIS — G2581 Restless legs syndrome: Secondary | ICD-10-CM

## 2020-06-15 DIAGNOSIS — F1011 Alcohol abuse, in remission: Secondary | ICD-10-CM

## 2020-06-15 DIAGNOSIS — F1211 Cannabis abuse, in remission: Secondary | ICD-10-CM

## 2020-06-15 MED ORDER — LITHIUM CARBONATE 300 MG PO TABS: 300.0000 mg | ORAL_TABLET | Freq: Two times a day (BID) | ORAL | 0 refills | Status: DC

## 2020-06-15 MED ORDER — BENZTROPINE MESYLATE 1 MG PO TABS: 0.5000 mg | ORAL_TABLET | Freq: Three times a day (TID) | ORAL | 0 refills | Status: DC | PRN

## 2020-06-15 MED ORDER — SERTRALINE HCL 50 MG PO TABS: 50.0000 mg | ORAL_TABLET | Freq: Every day | ORAL | 0 refills | Status: DC

## 2020-06-15 MED ORDER — TRAZODONE HCL 100 MG PO TABS: ORAL_TABLET | ORAL | 0 refills | Status: DC

## 2020-06-15 MED ORDER — OLANZAPINE 20 MG PO TABS: 20.0000 mg | ORAL_TABLET | Freq: Every day | ORAL | 0 refills | Status: DC

## 2020-06-15 NOTE — Telephone Encounter (Signed)
   Telephone encounter was:  Unsuccessful.  06/15/2020 Name: Vanessa Baker MRN: [id number] DOB: 03/03/1975  Unsuccessful outbound call made today to assist with:  Transportation Needs , Food Insecurity, Home Modifications and Financial Difficulties related to rent  Outreach Attempt:  3rd Attempt.  Referral closed unable to contact patient.  A HIPAA compliant voice message was left requesting a return call.  Instructed patient to call back at   Instructed patient to call back at [phone]  at their earliest convenience. [place], Care Management  [phone] [address] , [place] [address] Email : [email or web address]

## 2020-06-15 NOTE — Progress Notes (Signed)
Virtual Visit via Video Note  I connected with Vanessa Baker on 06/15/20 at  2:30 PM EDT by a video enabled telemedicine application and verified that I am speaking with the correct person using two identifiers.  Location Provider Location : [facility] Patient Location : Home  Participants: Patient , Provider    I discussed the limitations of evaluation and management by telemedicine and the availability of in person appointments. The patient expressed understanding and agreed to proceed.    I discussed the assessment and treatment plan with the patient. The patient was provided an opportunity to ask questions and all were answered. The patient agreed with the plan and demonstrated an understanding of the instructions.   The patient was advised to call back or seek an in-person evaluation if the symptoms worsen or if the condition fails to improve as anticipated.  [place] MD OP Progress Note  06/15/2020 5:57 PM Tabby Hafsa Lohn  MRN:  [id number]  Chief Complaint:  Chief Complaint    Follow-up; Anxiety     HPI: Vanessa Baker is a 45 year old Caucasian female, divorced, has a history of bipolar disorder, PTSD, neuroleptic induced Parkinson's disease, tobacco use disorder, cocaine use disorder, alcohol use disorder, hyperlipidemia, IBS was evaluated by telemedicine today.  Patient today reports she does have anxiety symptoms due to her relationship struggles with her husband.  She however reports she has been coping okay.  She reports overall she has been doing well on the medication.  Denies any hallucinations.  Reports sleep is overall okay.  Patient denies any suicidality, homicidality or perceptual disturbances.  Patient denies abusing any substances.  Patient denies any side effects to medications.  Patient continues to report memory changes.  Reports short-term memory changes, losing her train of thoughts during conversations.  she however reports she has  been noncompliant with neurology visit due to financial problems.  Patient denies any other concerns today.  Visit Diagnosis:    ICD-10-CM   1. Bipolar disorder, in full remission, most recent episode depressed ([place])  F31.76 traZODone (DESYREL) 100 MG tablet    sertraline (ZOLOFT) 50 MG tablet    OLANZapine (ZYPREXA) 20 MG tablet    lithium 300 MG tablet  2. PTSD (post-traumatic stress disorder)  F43.10 traZODone (DESYREL) 100 MG tablet    sertraline (ZOLOFT) 50 MG tablet    OLANZapine (ZYPREXA) 20 MG tablet  3. Neuroleptic induced parkinsonism (HCC)  G21.11 benztropine (COGENTIN) 1 MG tablet  4. Restless leg syndrome  G25.81   5. Cocaine use disorder, moderate, in sustained remission (HCC)  F14.21   6. Alcohol use disorder, mild, in early remission  F10.11   7. Cannabis use disorder, mild, in early remission  F12.11   8. Memory loss  R41.3 Vitamin B12  9. High risk medication use  Z79.899 Lithium level    TSH    BUN+Creat    Vitamin B12    Past Psychiatric History: I have reviewed past psychiatric history from progress note on 04/02/2017.  Past trials of Seroquel, Latuda, Ambien, Klonopin, Topamax, gabapentin, Trintellix, Abilify, Rexulti, Artane  Past Medical History:  Past Medical History:  Diagnosis Date  . Allergy   . Anxiety   . Asthma   . Bipolar disorder ([place])   . Bursitis   . Depression   . Diabetes mellitus without complication ([place])   . Essential hypertension 03/28/2017  . Frequent headaches   . GERD (gastroesophageal reflux disease)   . Headache   . High cholesterol   .  Hypertension   . Irritable bowel syndrome (IBS)   . Irritable colon 02/23/2010  . Migraine 04/02/2017  . Obesity (BMI 30-39.9) 11/25/2013  . Seizure ([place]) [phone]  . Seizures ([place])   . Tremor     Past Surgical History:  Procedure Laterality Date  . CHOLECYSTECTOMY N/A 04/27/2019   Procedure: LAPAROSCOPIC CHOLECYSTECTOMY;  Surgeon: Fredirick Maudlin, MD;  Location: [facility] ORS;  Service: General;   Laterality: N/A;  . NO PAST SURGERIES      Family Psychiatric History: I have reviewed family psychiatric history from progress note on 04/02/2017  Family History:  Family History  Problem Relation Age of Onset  . Bipolar disorder Mother   . Schizophrenia Mother   . Hypertension Mother   . Diabetes Mother   . Cancer Mother   . Anxiety disorder Mother   . ADD / ADHD Mother   . Alcohol abuse Mother   . Drug abuse Mother   . Heart disease Mother   . Bipolar disorder Sister   . Anxiety disorder Sister   . Drug abuse Sister   . Bipolar disorder Brother   . Anxiety disorder Brother   . Drug abuse Brother   . Hypertension Father   . Cancer Father   . Diabetes Father   . Breast cancer Maternal Aunt   . Leukemia Maternal Aunt   . Breast cancer Paternal Aunt   . Breast cancer Maternal Grandmother   . Alcohol abuse Maternal Grandmother   . Bipolar disorder Maternal Grandmother   . Breast cancer Paternal Grandmother   . Parkinson's disease Paternal Grandmother   . Alcohol abuse Maternal Grandfather   . Prostate cancer Maternal Grandfather   . Alcohol abuse Maternal Uncle   . Multiple myeloma Maternal Uncle   . ADD / ADHD Son   . Throat cancer Maternal Aunt   . Tremor Neg Hx     Social History: Reviewed social history from progress note on 04/02/2017 Social History   Socioeconomic History  . Marital status: Married    Spouse name: dewey   . Number of children: 1  . Years of education: 73  . Highest education level: Associate degree: occupational, Hotel manager, or vocational program  Occupational History  . Occupation: disability  Tobacco Use  . Smoking status: Former Smoker    Packs/day: 0.50    Years: 27.00    Pack years: 13.50    Types: Cigarettes    Quit date: 04/26/2019    Years since quitting: 1.1  . Smokeless tobacco: Never Used  Vaping Use  . Vaping Use: Former  Substance and Sexual Activity  . Alcohol use: Yes  . Drug use: No  . Sexual activity: Yes     Partners: Male    Birth control/protection: None  Other Topics Concern  . Not on file  Social History Narrative   Lives with husband, [name] (on [place])   Right-handed   Caffeine use: 1 cup coffee a day, 2 soft drinks per day   Social Determinants of Health   Financial Resource Strain: Medium Risk  . Difficulty of Paying Living Expenses: Somewhat hard  Food Insecurity: No Food Insecurity  . Worried About Charity fundraiser in the Last Year: Never true  . Ran Out of Food in the Last Year: Never true  Transportation Needs: No Transportation Needs  . Lack of Transportation (Medical): No  . Lack of Transportation (Non-Medical): No  Physical Activity: Inactive  . Days of Exercise per Week: 0 days  .  Minutes of Exercise per Session: 0 min  Stress: Stress Concern Present  . Feeling of Stress : Very much  Social Connections: Moderately Isolated  . Frequency of Communication with Friends and Family: Three times a week  . Frequency of Social Gatherings with Friends and Family: Three times a week  . Attends Religious Services: Never  . Active Member of Clubs or Organizations: No  . Attends Archivist Meetings: Never  . Marital Status: Living with partner    Allergies:  Allergies  Allergen Reactions  . Amitriptyline Other (See Comments)    Confusion, paralysis  . Wellbutrin [Bupropion]     falls    Metabolic Disorder Labs: Lab Results  Component Value Date   HGBA1C 5.1 12/15/2019   MPG 111.15 07/08/2019   MPG 128.37 04/27/2019   Lab Results  Component Value Date   PROLACTIN 8.9 07/08/2019   PROLACTIN 4.6 (L) 04/22/2017   Lab Results  Component Value Date   CHOL 189 12/15/2019   TRIG 359 (H) 12/15/2019   HDL 34 (L) 12/15/2019   CHOLHDL 3.2 07/08/2019   VLDL 63 (H) 07/08/2019   LDLCALC 95 12/15/2019   LDLCALC 46 08/25/2019   Lab Results  Component Value Date   TSH 2.880 01/13/2020   TSH 1.340 12/08/2019    Therapeutic Level Labs: Lab Results   Component Value Date   LITHIUM 0.35 (L) 03/21/2020   LITHIUM 0.83 01/14/2020   Lab Results  Component Value Date   VALPROATE 103 (H) 04/26/2019   VALPROATE 61.4 08/13/2017   No components found for:  CBMZ  Current Medications: Current Outpatient Medications  Medication Sig Dispense Refill  . benztropine (COGENTIN) 1 MG tablet Take 0.5-1 tablets (0.5-1 mg total) by mouth 3 (three) times daily as needed for tremors. 270 tablet 0  . ACCU-CHEK AVIVA PLUS test strip USE 1 STRIP TO CHECK GLUCOSE UP TO 3 TIMES DAILY AS DIRECTED. 300 each 1  . Accu-Chek Softclix Lancets lancets Use to check blood sugar up to 3 times daily as instructed 300 each 1  . albuterol (VENTOLIN HFA) 108 (90 Base) MCG/ACT inhaler INHALE 2 PUFFS BY MOUTH EVERY 6 HOURS AS NEEDED FOR WHEEZING AND FOR SHORTNESS OF BREATH 18 g 4  . atorvastatin (LIPITOR) 40 MG tablet Take 1 tablet (40 mg total) by mouth daily. 90 tablet 4  . baclofen (LIORESAL) 10 MG tablet Take 1 tablet (10 mg total) by mouth 3 (three) times daily as needed for muscle spasms. 90 tablet 4  . blood glucose meter kit and supplies Dispense based on patient and insurance preference. Use up to four times daily as directed. (FOR ICD-10 E10.9, E11.9). 1 each 0  . Blood Glucose Monitoring Suppl (ACCU-CHEK AVIVA PLUS) w/Device KIT Use to check blood sugar up to 3 times daily 1 kit 0  . Calcium Carb-Cholecalciferol (CALCIUM 600+D3 PO) Take 1 tablet by mouth daily.    . cetirizine (ZYRTEC) 10 MG tablet Take 1 tablet (10 mg total) by mouth daily as needed for allergies (itching). 30 tablet 0  . Dulaglutide (TRULICITY) 1.5 WU/9.8JX SOPN Inject 1.5 mg into the skin every 7 (seven) days. 2 mL 0  . fluticasone-salmeterol (ADVAIR HFA) 115-21 MCG/ACT inhaler Inhale 2 puffs into the lungs 2 (two) times daily. 1 each 12  . gabapentin (NEURONTIN) 300 MG capsule TAKE 1 CAPSULE BY MOUTH TWICE DAILY FOR  RESTLESS  LEGS 180 capsule 0  . lisinopril (ZESTRIL) 10 MG tablet Take 1 tablet  (10 mg total) by  mouth daily. 30 tablet 0  . lithium 300 MG tablet Take 1 tablet (300 mg total) by mouth in the morning and at bedtime. 180 tablet 0  . lithium carbonate 150 MG capsule TAKE 1 CAPSULE BY MOUTH ONCE DAILY WITH  SUPPER  (TO  BE  COMBINED  WITH  $Rem'300MG'Tjff$ ) 30 capsule 0  . metFORMIN (GLUCOPHAGE) 500 MG tablet Take 1 tablet (500 mg total) by mouth 2 (two) times daily. 60 tablet 0  . Methylcobalamin (B-12) 1000 MCG TBDP Take 1,000 mcg by mouth daily. 90 tablet 4  . norethindrone-ethinyl estradiol (JUNEL FE 1/20) 1-20 MG-MCG tablet Take 1 tablet by mouth daily. 84 tablet 0  . OLANZapine (ZYPREXA) 20 MG tablet Take 1 tablet (20 mg total) by mouth at bedtime. 90 tablet 0  . omega-3 acid ethyl esters (LOVAZA) 1 g capsule Take 1 capsule by mouth 2 (two) times daily.    [place] omeprazole (PRILOSEC) 20 MG capsule Take 1 capsule (20 mg total) by mouth daily. 30 capsule 3  . primidone (MYSOLINE) 50 MG tablet Take 1 tablet (50 mg total) by mouth at bedtime. 90 tablet 4  . sertraline (ZOLOFT) 50 MG tablet Take 1 tablet (50 mg total) by mouth daily. 90 tablet 0  . tiZANidine (ZANAFLEX) 4 MG tablet     . traZODone (DESYREL) 100 MG tablet TAKE 1 & 1/2 (ONE & ONE-HALF) TABLETS BY MOUTH AT BEDTIME FOR SLEEP 135 tablet 0  . triamcinolone (KENALOG) 0.1 % Apply 1 application topically 2 (two) times daily. 30 g 2   No current facility-administered medications for this visit.     Musculoskeletal: Strength & Muscle Tone: UTA Gait & Station: normal Patient leans: N/A  Psychiatric Specialty Exam: Review of Systems  Psychiatric/Behavioral: The patient is nervous/anxious.   All other systems reviewed and are negative.   Last menstrual period 11/26/2018.There is no height or weight on file to calculate BMI.  General Appearance: Casual  Eye Contact:  Fair  Speech:  Normal Rate  Volume:  Normal  Mood:  Anxious  Affect:  Congruent  Thought Process:  Goal Directed and Descriptions of Associations: Intact   Orientation:  Full (Time, Place, and Person)  Thought Content: Logical   Suicidal Thoughts:  No  Homicidal Thoughts:  No  Memory:  Immediate;   Fair Recent;   Fair Remote;   Fair reports short term memory changes   Judgement:  Fair  Insight:  Fair  Psychomotor Activity:  Normal  Concentration:  Concentration: Fair and Attention Span: Fair  Recall:  [employer] of Knowledge: Fair  Language: Fair  Akathisia:  No  Handed:  Right  AIMS (if indicated): UTA  Assets:  Communication Skills Desire for Improvement Housing Social Support  ADL's:  Intact  Cognition: WNL  Sleep:  Better   Screenings: [place] Office Visit from 04/07/2018 in [place] Office Visit from 01/01/2018 in [place] Total Score 10 1    [place] Office Visit from 08/25/2019 in [place] Visit from 12/16/2018 in [facility] Visit from 09/06/2017 in [facility]  Total GAD-7 Score $RemoveBef'8 8 8    'EYrYrbcdmO$ PHQ2-9   Flowsheet Row Video Visit from 03/22/2020 in [place] Office Visit from 02/25/2020 in [place] from 01/08/2020 in [place] Visit from 12/15/2019 in [place] Visit from 08/25/2019 in [facility]  PHQ-2 Total Score 1 0 $R'6 2 6  'jR$ PHQ-9 Total Score -- $Remove'11 14 12 17    'ajXPaKh$ Flowsheet Row Video Visit from 03/22/2020 in [place] [facility]       Assessment and Plan: Dominigue Gellner is a 45 year old Caucasian female who has a history of bipolar disorder, PTSD, panic disorder, drug-induced Parkinson's, diabetes melitis, polysubstance abuse was evaluated by telemedicine today.  Patient with multiple medical problems, relationship struggles, past history of substance abuse problems, continues to struggle with memory  changes and will benefit from the following plan.  Plan Bipolar disorder in remission Lithium 300 mg p.o. daily in the morning and 450 mg p.o. daily with supper Continue  Zoloft as prescribed Continue Zyprexa 20 mg p.o. nightly  Panic attacks-stable Zoloft 50 mg p.o. daily   PTSD-stable Zoloft 50 mg p.o. daily  Insomnia- improving Patient was referred for sleep study-she has been noncompliant Trazodone 100 mg p.o. nightly Zyprexa 20 mg p.o. daily  Neuroleptic induced Parkinson's disease-stable Reduce Cogentin to 0.5 mg - $Re'1mg'NSk$  p.o. 3 times daily as needed.  Patient advised to limit use.  Discussed with patient side effects of Cogentin including memory changes.  Alcohol use disorder/cocaine and cannabis use disorder in remission Will monitor closely.  She will benefit from urine drug screen.  For memory loss-she was referred to neurology in the past. Will order TSH, vitamin B12.  High risk medication use-we will order lithium level, BUN/creatinine.  Will mail lab order to her home per her request.  Patient with noncompliance, provided education.  Patient advised to come into the office for an office visit in 2 to 3 months from now.  This note was generated in part or whole with voice recognition software. Voice recognition is usually quite accurate but there are transcription errors that can and very often do occur. I apologize for any typographical errors that were not detected and corrected.       Ursula Alert, MD 06/16/2020, 6:21 PM

## 2020-06-17 ENCOUNTER — Other Ambulatory Visit: Payer: Self-pay | Admitting: Nurse Practitioner

## 2020-06-17 ENCOUNTER — Telehealth: Payer: Self-pay

## 2020-06-17 DIAGNOSIS — Z794 Long term (current) use of insulin: Secondary | ICD-10-CM

## 2020-06-17 NOTE — Telephone Encounter (Signed)
faxed and confrimed labwork order sent to [facility] [phone] , tsh, bun+creat, vit b12. lthium level dxZ79.899,  r41.3

## 2020-06-17 NOTE — Telephone Encounter (Signed)
Requested Prescriptions  Pending Prescriptions Disp Refills  . TRULICITY 1.5 VQ/0.0QQ SOPN [Pharmacy Med Name: Trulicity 1.5 PY/1.9JK Subcutaneous Solution Pen-injector] 4 mL 0    Sig: INJECT 1.5 MG  SUBCUTANEOUSLY ONCE A WEEK     Endocrinology:  Diabetes - GLP-1 Receptor Agonists Failed - 06/17/2020 12:53 PM      Failed - HBA1C is between 0 and 7.9 and within 180 days    Hemoglobin A1C  Date Value Ref Range Status  12/09/2014 5.4  Final   HB A1C (BAYER DCA - WAIVED)  Date Value Ref Range Status  12/15/2019 5.1 <7.0 % Final    Comment:                                          Diabetic Adult            <7.0                                       Healthy Adult        4.3 - 5.7                                                           (DCCT/NGSP) American Diabetes Association's Summary of Glycemic Recommendations for Adults with Diabetes: Hemoglobin A1c <7.0%. More stringent glycemic goals (A1c <6.0%) may further reduce complications at the cost of increased risk of hypoglycemia.          Passed - Valid encounter within last 6 months    Recent Outpatient Visits          2 months ago Right foot pain   [place], [place] T, NP   3 months ago [place], [place] T, NP   4 months ago Neuroleptic induced parkinsonism ([place])   [place], Barbaraann Faster, NP   5 months ago Memory changes   [place], [place] T, NP   6 months ago Type 2 diabetes mellitus with obesity ([place])   [place], Barbaraann Faster, NP      Future Appointments            In 6 days Cannady, Barbaraann Faster, NP [employer], [facility]   In 6 months  [employer], [facility]

## 2020-06-23 ENCOUNTER — Ambulatory Visit: Payer: Medicare HMO | Admitting: Nurse Practitioner

## 2020-06-24 ENCOUNTER — Other Ambulatory Visit: Payer: Self-pay | Admitting: Nurse Practitioner

## 2020-06-24 DIAGNOSIS — E1165 Type 2 diabetes mellitus with hyperglycemia: Secondary | ICD-10-CM

## 2020-06-24 DIAGNOSIS — Z794 Long term (current) use of insulin: Secondary | ICD-10-CM

## 2020-06-24 NOTE — Telephone Encounter (Signed)
Requested medication (s) are due for refill today:  No  Requested medication (s) are on the active medication list:   Yes  Future visit scheduled:   No   Last ordered: 06/17/2020 82ml, 0 refills  Returned because it looks like it might be too soon to refill however wasn't sure.      Requested Prescriptions  Pending Prescriptions Disp Refills   TRULICITY 1.5 TR/3.2YE SOPN [Pharmacy Med Name: Trulicity 1.5 BX/4.3HW Subcutaneous Solution Pen-injector] 4 mL 0    Sig: INJECT 1.5MG  SUBCUTANEOUSLY ONCE WEEKLY      Endocrinology:  Diabetes - GLP-1 Receptor Agonists Failed - 06/24/2020  1:25 PM      Failed - HBA1C is between 0 and 7.9 and within 180 days    Hemoglobin A1C  Date Value Ref Range Status  12/09/2014 5.4  Final   HB A1C (BAYER DCA - WAIVED)  Date Value Ref Range Status  12/15/2019 5.1 <7.0 % Final    Comment:                                          Diabetic Adult            <7.0                                       Healthy Adult        4.3 - 5.7                                                           (DCCT/NGSP) American Diabetes Association's Summary of Glycemic Recommendations for Adults with Diabetes: Hemoglobin A1c <7.0%. More stringent glycemic goals (A1c <6.0%) may further reduce complications at the cost of increased risk of hypoglycemia.           Passed - Valid encounter within last 6 months    Recent Outpatient Visits           2 months ago Right foot pain   [place], [place] T, NP   4 months ago [place], [place] T, NP   4 months ago Neuroleptic induced parkinsonism ([place])   [place], Barbaraann Faster, NP   5 months ago Memory changes   [place], [place] T, NP   6 months ago Type 2 diabetes mellitus with obesity ([place])   [place], Barbaraann Faster, NP       Future Appointments             In 6 months [place], [facility]

## 2020-07-01 ENCOUNTER — Telehealth: Payer: Medicare HMO

## 2020-07-01 ENCOUNTER — Telehealth: Payer: Self-pay | Admitting: Licensed Clinical Social Worker

## 2020-07-01 NOTE — Telephone Encounter (Signed)
    Clinical Social Work  Chronic Care Management   Phone Outreach    07/01/2020 Name: Vanessa Baker MRN: [id number] DOB: 25-Sep-1975  [name] is a 45 y.o. year old female who is a primary care patient of Cannady, Barbaraann Faster, NP .   CCM LCSW reached out to patient today by phone to introduce self, assess needs and offer Care Management services and interventions.    2nd unsuccessful telephone outreach attempt.  If unable to reach patient by phone on the 3rd attempt, will discontinue outreach calls but will be available at any time to provide services.   Plan:CCM LCSW will wait for return call. If no return call is received, Will route chart to Care Guide to see if patient would like to reschedule phone appointment   Review of patient status, including review of consultants reports, relevant laboratory and other test results, and collaboration with appropriate care team members and the patient's provider was performed as part of comprehensive patient evaluation and provision of care management services.     Christa See, MSW, [email or web address] Phone [phone] 3:10 PM

## 2020-07-04 DIAGNOSIS — J4541 Moderate persistent asthma with (acute) exacerbation: Secondary | ICD-10-CM | POA: Diagnosis not present

## 2020-07-04 DIAGNOSIS — C3400 Malignant neoplasm of unspecified main bronchus: Secondary | ICD-10-CM | POA: Diagnosis not present

## 2020-07-04 DIAGNOSIS — J9601 Acute respiratory failure with hypoxia: Secondary | ICD-10-CM | POA: Diagnosis not present

## 2020-07-13 ENCOUNTER — Other Ambulatory Visit: Payer: Self-pay | Admitting: Nurse Practitioner

## 2020-07-13 DIAGNOSIS — G2581 Restless legs syndrome: Secondary | ICD-10-CM

## 2020-07-13 DIAGNOSIS — Z794 Long term (current) use of insulin: Secondary | ICD-10-CM

## 2020-07-13 NOTE — Telephone Encounter (Signed)
Requested Prescriptions  Pending Prescriptions Disp Refills  . omeprazole (PRILOSEC) 20 MG capsule [Pharmacy Med Name: Omeprazole 20 MG Oral Capsule Delayed Release] 90 capsule 1    Sig: Take 1 capsule by mouth once daily     Gastroenterology: Proton Pump Inhibitors Passed - 07/13/2020  8:07 PM      Passed - Valid encounter within last 12 months    Recent Outpatient Visits          3 months ago Right foot pain   [place], Henrine Screws T, NP   4 months ago [place], [place] T, NP   5 months ago Neuroleptic induced parkinsonism ([place])   [place], Barbaraann Faster, NP   6 months ago Memory changes   [place], [place] T, NP   7 months ago Type 2 diabetes mellitus with obesity ([place])   [place], Barbaraann Faster, NP      Future Appointments            In 6 months [place], [facility]

## 2020-07-13 NOTE — Telephone Encounter (Signed)
Requested Prescriptions  Pending Prescriptions Disp Refills  . TRULICITY 1.5 ZH/0.8MV SOPN [Pharmacy Med Name: Trulicity 1.5 HQ/4.6NG Subcutaneous Solution Pen-injector] 4 mL 0    Sig: INJECT 1.5MG  SUBCUTANEOUSLY ONCE WEEKLY     Endocrinology:  Diabetes - GLP-1 Receptor Agonists Failed - 07/13/2020  5:03 PM      Failed - HBA1C is between 0 and 7.9 and within 180 days    Hemoglobin A1C  Date Value Ref Range Status  12/09/2014 5.4  Final   HB A1C (BAYER DCA - WAIVED)  Date Value Ref Range Status  12/15/2019 5.1 <7.0 % Final    Comment:                                          Diabetic Adult            <7.0                                       Healthy Adult        4.3 - 5.7                                                           (DCCT/NGSP) American Diabetes Association's Summary of Glycemic Recommendations for Adults with Diabetes: Hemoglobin A1c <7.0%. More stringent glycemic goals (A1c <6.0%) may further reduce complications at the cost of increased risk of hypoglycemia.          Passed - Valid encounter within last 6 months    Recent Outpatient Visits          3 months ago Right foot pain   [place], [place] T, NP   4 months ago [place], [place] T, NP   5 months ago Neuroleptic induced parkinsonism ([place])   [place], Barbaraann Faster, NP   6 months ago Memory changes   [place], [place] T, NP   7 months ago Type 2 diabetes mellitus with obesity ([place])   [place], Henrine Screws T, NP      Future Appointments            In 6 months [place], [facility]            . gabapentin (NEURONTIN) 300 MG capsule [Pharmacy Med Name: Gabapentin 300 MG Oral Capsule] 180 capsule 0    Sig: TAKE 1 CAPSULE BY MOUTH [place]     Neurology: Anticonvulsants - gabapentin Passed - 07/13/2020  5:03 PM      Passed - Valid encounter within last 12  months    Recent Outpatient Visits          3 months ago Right foot pain   [place], Henrine Screws T, NP   4 months ago [place], [place] T, NP   5 months ago Neuroleptic induced parkinsonism ([place])   [place], Barbaraann Faster, NP   6 months ago Memory changes   [place], Barbaraann Faster, NP   7  months ago Type 2 diabetes mellitus with obesity ([place])   [place], Barbaraann Faster, NP      Future Appointments            In 6 months [place], [facility]

## 2020-07-19 ENCOUNTER — Other Ambulatory Visit: Payer: Self-pay | Admitting: Nurse Practitioner

## 2020-07-22 ENCOUNTER — Other Ambulatory Visit: Payer: Self-pay | Admitting: Nurse Practitioner

## 2020-07-25 ENCOUNTER — Ambulatory Visit: Payer: Self-pay | Admitting: Licensed Clinical Social Worker

## 2020-07-25 DIAGNOSIS — I152 Hypertension secondary to endocrine disorders: Secondary | ICD-10-CM

## 2020-07-25 DIAGNOSIS — F1011 Alcohol abuse, in remission: Secondary | ICD-10-CM

## 2020-07-25 DIAGNOSIS — F4310 Post-traumatic stress disorder, unspecified: Secondary | ICD-10-CM

## 2020-07-25 NOTE — Chronic Care Management (AMB) (Signed)
    Clinical Social Work  Chronic Care Management   Phone Outreach    07/25/2020 Name: Vanessa Baker MRN: [id number] DOB: 26-May-1975  Vanessa Baker is a 45 y.o. year old female who is a primary care patient of Cannady, Barbaraann Faster, NP .   CCM LCSW reached out to patient today by phone to introduce self, assess needs and offer Care Management services and interventions.    3rd unsuccessful telephone outreach was attempted today. The patient was referred to the case management team for assistance with care management and care coordination. The patient's primary care provider has been notified of our unsuccessful attempts to make or maintain contact with the patient. The care management team is pleased to engage with this patient at any time in the future should he/she be interested in assistance from the care management team.   Plan: Wil disconnect with patient. A new CCM referral can be placed if needs arise.    Review of patient status, including review of consultants reports, relevant laboratory and other test results, and collaboration with appropriate care team members and the patient's provider was performed as part of comprehensive patient evaluation and provision of care management services.    Christa See, MSW, [email or web address] Phone [phone] 10:21 AM

## 2020-08-04 DIAGNOSIS — J9601 Acute respiratory failure with hypoxia: Secondary | ICD-10-CM | POA: Diagnosis not present

## 2020-08-04 DIAGNOSIS — C3400 Malignant neoplasm of unspecified main bronchus: Secondary | ICD-10-CM | POA: Diagnosis not present

## 2020-08-04 DIAGNOSIS — J4541 Moderate persistent asthma with (acute) exacerbation: Secondary | ICD-10-CM | POA: Diagnosis not present

## 2020-08-19 ENCOUNTER — Other Ambulatory Visit: Payer: Medicare HMO

## 2020-08-19 ENCOUNTER — Other Ambulatory Visit: Payer: Self-pay

## 2020-08-19 DIAGNOSIS — E1169 Type 2 diabetes mellitus with other specified complication: Secondary | ICD-10-CM

## 2020-08-19 DIAGNOSIS — Z79899 Other long term (current) drug therapy: Secondary | ICD-10-CM | POA: Diagnosis not present

## 2020-08-19 DIAGNOSIS — E669 Obesity, unspecified: Secondary | ICD-10-CM

## 2020-08-19 DIAGNOSIS — M79671 Pain in right foot: Secondary | ICD-10-CM

## 2020-08-19 DIAGNOSIS — R413 Other amnesia: Secondary | ICD-10-CM | POA: Diagnosis not present

## 2020-08-20 LAB — LIPID PANEL W/O CHOL/HDL RATIO
Cholesterol, Total: 123 mg/dL (ref 100–199)
HDL: 36 mg/dL — ABNORMAL LOW (ref >39)
LDL Chol Calc (NIH): 52 mg/dL (ref 0–99)
Triglycerides: 218 mg/dL — ABNORMAL HIGH (ref 0–149)
VLDL Cholesterol Cal: 35 mg/dL (ref 5–40)

## 2020-08-20 LAB — VITAMIN B12: Vitamin B-12: 299 pg/mL (ref 232–1245)

## 2020-08-20 LAB — BUN+CREAT
BUN/Creatinine Ratio: 9 (ref 9–23)
BUN: 7 mg/dL (ref 6–24)
Creatinine, Ser: 0.76 mg/dL (ref 0.57–1.00)
eGFR: 98 mL/min/{1.73_m2} (ref >59)

## 2020-08-20 LAB — LITHIUM LEVEL: Lithium Lvl: 0.7 mmol/L (ref 0.5–1.2)

## 2020-08-20 LAB — TSH: TSH: 2.24 u[IU]/mL (ref 0.450–4.500)

## 2020-08-20 LAB — HEMOGLOBIN A1C
Est. average glucose Bld gHb Est-mCnc: 128 mg/dL
Hgb A1c MFr Bld: 6.1 % — ABNORMAL HIGH (ref 4.8–5.6)

## 2020-08-21 ENCOUNTER — Other Ambulatory Visit: Payer: Self-pay | Admitting: Family Medicine

## 2020-08-21 ENCOUNTER — Other Ambulatory Visit: Payer: Self-pay | Admitting: Psychiatry

## 2020-08-21 ENCOUNTER — Other Ambulatory Visit: Payer: Self-pay | Admitting: Nurse Practitioner

## 2020-08-21 DIAGNOSIS — F3176 Bipolar disorder, in full remission, most recent episode depressed: Secondary | ICD-10-CM

## 2020-08-21 DIAGNOSIS — Z794 Long term (current) use of insulin: Secondary | ICD-10-CM

## 2020-08-21 DIAGNOSIS — J410 Simple chronic bronchitis: Secondary | ICD-10-CM

## 2020-08-21 DIAGNOSIS — F4310 Post-traumatic stress disorder, unspecified: Secondary | ICD-10-CM

## 2020-08-21 DIAGNOSIS — E1165 Type 2 diabetes mellitus with hyperglycemia: Secondary | ICD-10-CM

## 2020-08-21 DIAGNOSIS — G2581 Restless legs syndrome: Secondary | ICD-10-CM

## 2020-08-21 NOTE — Telephone Encounter (Signed)
Requested medication (s) are due for refill today: yes  Requested medication (s) are on the active medication list:   yes  Future visit scheduled: yes  Notes to clinic:  med not delegated to NT to RF   Requested Prescriptions  Pending Prescriptions Disp Refills   baclofen (LIORESAL) 10 MG tablet [Pharmacy Med Name: Baclofen 10 MG Oral Tablet] 90 tablet     Sig: Take 1 tablet by mouth three times daily as needed for muscle spasm      Not Delegated - Analgesics:  Muscle Relaxants Failed - 08/21/2020 10:18 AM      Failed - This refill cannot be delegated      Passed - Valid encounter within last 6 months    Recent Outpatient Visits           4 months ago Right foot pain   [place], Henrine Screws T, NP   5 months ago [place], [place] T, NP   6 months ago Neuroleptic induced parkinsonism ([place])   [place], Barbaraann Faster, NP   7 months ago Memory changes   [place], [place] T, NP   8 months ago Type 2 diabetes mellitus with obesity ([place])   [place], Barbaraann Faster, NP       Future Appointments             In 2 days Cannady, Barbaraann Faster, NP [facility]   In 4 months  [facility]              Signed Prescriptions Disp Refills   TRULICITY 1.5 OY/7.7AJ SOPN 4 mL 0    Sig: INJECT 1.5MG  SUBCUTANEOUSLY ONCE WEEKLY      Endocrinology:  Diabetes - GLP-1 Receptor Agonists Passed - 08/21/2020 10:18 AM      Passed - HBA1C is between 0 and 7.9 and within 180 days    Hemoglobin A1C  Date Value Ref Range Status  12/09/2014 5.4  Final   HB A1C (BAYER DCA - WAIVED)  Date Value Ref Range Status  12/15/2019 5.1 <7.0 % Final    Comment:                                          Diabetic Adult            <7.0                                       Healthy Adult        4.3 - 5.7                                                            (DCCT/NGSP) American Diabetes Association's Summary of Glycemic Recommendations for Adults with Diabetes: Hemoglobin A1c <7.0%. More stringent glycemic goals (A1c <6.0%) may further reduce complications at the cost of increased risk of hypoglycemia.    Hgb A1c MFr Bld  Date Value Ref Range Status  08/19/2020 6.1 (H) 4.8 - 5.6 % Final    Comment:  Prediabetes: 5.7 - 6.4          Diabetes: >6.4          Glycemic control for adults with diabetes: <7.0           Passed - Valid encounter within last 6 months    Recent Outpatient Visits           4 months ago Right foot pain   [place], [place] T, NP   5 months ago [place], [place] T, NP   6 months ago Neuroleptic induced parkinsonism ([place])   [place], Barbaraann Faster, NP   7 months ago Memory changes   [place], [place] T, NP   8 months ago Type 2 diabetes mellitus with obesity ([place])   [place], Barbaraann Faster, NP       Future Appointments             In 2 days Cannady, Barbaraann Faster, NP [facility]   In 4 months  [facility]

## 2020-08-21 NOTE — Telephone Encounter (Signed)
Requested Prescriptions  Pending Prescriptions Disp Refills  . baclofen (LIORESAL) 10 MG tablet [Pharmacy Med Name: Baclofen 10 MG Oral Tablet] 90 tablet     Sig: Take 1 tablet by mouth three times daily as needed for muscle spasm     Not Delegated - Analgesics:  Muscle Relaxants Failed - 08/21/2020 10:18 AM      Failed - This refill cannot be delegated      Passed - Valid encounter within last 6 months    Recent Outpatient Visits          4 months ago Right foot pain   [place], Henrine Screws T, NP   5 months ago [place], [place] T, NP   6 months ago Neuroleptic induced parkinsonism ([place])   [place], Barbaraann Faster, NP   7 months ago Memory changes   [place], [place] T, NP   8 months ago Type 2 diabetes mellitus with obesity ([place])   [place], Barbaraann Faster, NP      Future Appointments            In 2 days Cannady, Barbaraann Faster, NP [facility]   In 4 months  [facility]           . TRULICITY 1.5 HW/2.9HB SOPN [Pharmacy Med Name: Trulicity 1.5 ZJ/6.9CV Subcutaneous Solution Pen-injector] 4 mL 0    Sig: INJECT 1.5MG  SUBCUTANEOUSLY ONCE WEEKLY     Endocrinology:  Diabetes - GLP-1 Receptor Agonists Passed - 08/21/2020 10:18 AM      Passed - HBA1C is between 0 and 7.9 and within 180 days    Hemoglobin A1C  Date Value Ref Range Status  12/09/2014 5.4  Final   HB A1C (BAYER DCA - WAIVED)  Date Value Ref Range Status  12/15/2019 5.1 <7.0 % Final    Comment:                                          Diabetic Adult            <7.0                                       Healthy Adult        4.3 - 5.7                                                           (DCCT/NGSP) American Diabetes Association's Summary of Glycemic Recommendations for Adults with Diabetes: Hemoglobin A1c <7.0%. More stringent glycemic goals (A1c <6.0%) may further  reduce complications at the cost of increased risk of hypoglycemia.    Hgb A1c MFr Bld  Date Value Ref Range Status  08/19/2020 6.1 (H) 4.8 - 5.6 % Final    Comment:             Prediabetes: 5.7 - 6.4          Diabetes: >6.4          Glycemic control for adults with diabetes: <7.0  Passed - Valid encounter within last 6 months    Recent Outpatient Visits          4 months ago Right foot pain   [place], [place] T, NP   5 months ago [place], [place] T, NP   6 months ago Neuroleptic induced parkinsonism ([place])   [place], Barbaraann Faster, NP   7 months ago Memory changes   [place], [place] T, NP   8 months ago Type 2 diabetes mellitus with obesity ([place])   [place], Barbaraann Faster, NP      Future Appointments            In 2 days Cannady, Barbaraann Faster, NP [facility]   In 4 months  [facility]

## 2020-08-22 NOTE — Telephone Encounter (Signed)
Pt has apt on 08/23/2020

## 2020-08-23 ENCOUNTER — Ambulatory Visit (INDEPENDENT_AMBULATORY_CARE_PROVIDER_SITE_OTHER): Payer: Medicare HMO | Admitting: Nurse Practitioner

## 2020-08-23 ENCOUNTER — Other Ambulatory Visit: Payer: Self-pay

## 2020-08-23 ENCOUNTER — Other Ambulatory Visit: Payer: Self-pay | Admitting: Nurse Practitioner

## 2020-08-23 ENCOUNTER — Encounter: Payer: Self-pay | Admitting: Nurse Practitioner

## 2020-08-23 VITALS — BP 110/73 | HR 92 | Temp 99.0°F | Wt 185.0 lb

## 2020-08-23 DIAGNOSIS — Z789 Other specified health status: Secondary | ICD-10-CM

## 2020-08-23 DIAGNOSIS — G2111 Neuroleptic induced parkinsonism: Secondary | ICD-10-CM

## 2020-08-23 DIAGNOSIS — E1169 Type 2 diabetes mellitus with other specified complication: Secondary | ICD-10-CM

## 2020-08-23 DIAGNOSIS — E1165 Type 2 diabetes mellitus with hyperglycemia: Secondary | ICD-10-CM | POA: Diagnosis not present

## 2020-08-23 DIAGNOSIS — R69 Illness, unspecified: Secondary | ICD-10-CM | POA: Diagnosis not present

## 2020-08-23 DIAGNOSIS — F3176 Bipolar disorder, in full remission, most recent episode depressed: Secondary | ICD-10-CM

## 2020-08-23 DIAGNOSIS — E6609 Other obesity due to excess calories: Secondary | ICD-10-CM

## 2020-08-23 DIAGNOSIS — N926 Irregular menstruation, unspecified: Secondary | ICD-10-CM

## 2020-08-23 DIAGNOSIS — Z794 Long term (current) use of insulin: Secondary | ICD-10-CM | POA: Diagnosis not present

## 2020-08-23 DIAGNOSIS — Z6833 Body mass index (BMI) 33.0-33.9, adult: Secondary | ICD-10-CM

## 2020-08-23 DIAGNOSIS — F17210 Nicotine dependence, cigarettes, uncomplicated: Secondary | ICD-10-CM

## 2020-08-23 DIAGNOSIS — F1421 Cocaine dependence, in remission: Secondary | ICD-10-CM | POA: Diagnosis not present

## 2020-08-23 DIAGNOSIS — E538 Deficiency of other specified B group vitamins: Secondary | ICD-10-CM | POA: Diagnosis not present

## 2020-08-23 DIAGNOSIS — J4540 Moderate persistent asthma, uncomplicated: Secondary | ICD-10-CM

## 2020-08-23 DIAGNOSIS — E785 Hyperlipidemia, unspecified: Secondary | ICD-10-CM

## 2020-08-23 DIAGNOSIS — G2581 Restless legs syndrome: Secondary | ICD-10-CM

## 2020-08-23 DIAGNOSIS — E1159 Type 2 diabetes mellitus with other circulatory complications: Secondary | ICD-10-CM

## 2020-08-23 DIAGNOSIS — I152 Hypertension secondary to endocrine disorders: Secondary | ICD-10-CM

## 2020-08-23 DIAGNOSIS — E669 Obesity, unspecified: Secondary | ICD-10-CM

## 2020-08-23 MED ORDER — TRULICITY 1.5 MG/0.5ML ~~LOC~~ SOAJ: SUBCUTANEOUS | 4 refills | Status: DC

## 2020-08-23 MED ORDER — OMEPRAZOLE 20 MG PO CPDR: 20.0000 mg | DELAYED_RELEASE_CAPSULE | Freq: Every day | ORAL | 4 refills | Status: DC

## 2020-08-23 MED ORDER — ATORVASTATIN CALCIUM 40 MG PO TABS: 40.0000 mg | ORAL_TABLET | Freq: Every day | ORAL | 4 refills | Status: DC

## 2020-08-23 MED ORDER — NORETHIN ACE-ETH ESTRAD-FE 1-20 MG-MCG PO TABS: 1.0000 | ORAL_TABLET | Freq: Every day | ORAL | 4 refills | Status: DC

## 2020-08-23 MED ORDER — LISINOPRIL 10 MG PO TABS: 10.0000 mg | ORAL_TABLET | Freq: Every day | ORAL | 4 refills | Status: DC

## 2020-08-23 MED ORDER — ADVAIR HFA 115-21 MCG/ACT IN AERO: 2.0000 | INHALATION_SPRAY | Freq: Two times a day (BID) | RESPIRATORY_TRACT | 12 refills | Status: DC

## 2020-08-23 MED ORDER — METFORMIN HCL 500 MG PO TABS: 500.0000 mg | ORAL_TABLET | Freq: Two times a day (BID) | ORAL | 4 refills | Status: DC

## 2020-08-23 MED ORDER — CALCIUM CARBONATE+VITAMIN D 600-200 MG-UNIT PO TABS: 1.0000 | ORAL_TABLET | Freq: Every day | ORAL | 4 refills | Status: DC

## 2020-08-23 MED ORDER — B-12 1000 MCG PO TBDP: 1000.0000 ug | ORAL_TABLET | Freq: Every day | ORAL | 4 refills | Status: DC

## 2020-08-23 MED ORDER — PRIMIDONE 50 MG PO TABS: 50.0000 mg | ORAL_TABLET | Freq: Every day | ORAL | 4 refills | Status: DC

## 2020-08-23 MED ORDER — GABAPENTIN 300 MG PO CAPS: ORAL_CAPSULE | ORAL | 4 refills | Status: DC

## 2020-08-23 MED ORDER — OMEGA-3-ACID ETHYL ESTERS 1 G PO CAPS: 1.0000 | ORAL_CAPSULE | Freq: Two times a day (BID) | ORAL | 4 refills | Status: DC

## 2020-08-23 NOTE — Assessment & Plan Note (Signed)
Chronic, ongoing.  BP below goal today.  Continue Lisinopril daily, kidney protection in diabetes, refills in.  Urine ALB check today.  A1C 6.1% recently.  Recommend she monitor BP at home daily and document + bring to visits.  Focus on DASH diet.  Return to office in 6 months for follow-up.

## 2020-08-23 NOTE — Assessment & Plan Note (Addendum)
Chronic, ongoing.  A1C recent 6.1%.  Urine ALB today.  Continue current medication regimen and consider reduction in future.  Recommend she monitor BS at home every morning, fasting, and document for provider + bring to visits.  Focus on diabetic diet.  Return to office in 6 months.

## 2020-08-23 NOTE — Assessment & Plan Note (Signed)
Chronic, stable.  Continue collaboration with neurology as needed.  Continue current medication regimen.

## 2020-08-23 NOTE — Assessment & Plan Note (Addendum)
On BCP, but no menses in over 2 years she reports.  Consider discontinuation of this at next visit due to age and smoking.

## 2020-08-23 NOTE — Assessment & Plan Note (Signed)
Continues to remain sustained from cocaine use, recommend continued cessation.
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
## 2020-08-23 NOTE — Assessment & Plan Note (Signed)
Noted on recent labs, low normal.  Script for Vitamin B12 sent, will offer benefit to memory.  Recommend she take this daily.

## 2020-08-23 NOTE — Assessment & Plan Note (Signed)
Chronic, ongoing.  Continue Atorvastatin 40 MG daily and adjust as needed, refills sent.  Lipid panel panel today.  Return in 6 months.

## 2020-08-23 NOTE — Assessment & Plan Note (Signed)
BMI 33.36.  Recommended eating smaller high protein, low fat meals more frequently and exercising 30 mins a day 5 times a week with a goal of 10-15lb weight loss in the next 3 months. Patient voiced their understanding and motivation to adhere to these recommendations.

## 2020-08-23 NOTE — Assessment & Plan Note (Signed)
Chronic, ongoing.  Recommend cessation of smoking.  Monitor closely for return to tobacco use.  Will continue Advair and Albuterol, consider change to [facility] in future, does not like powder. Return in 6 months.  Spirometry next visit.  Start lung CT screening at age 45.

## 2020-08-23 NOTE — Assessment & Plan Note (Signed)
Chronic, ongoing.  Denies SI/HI.  Continue collaboration with psychiatry and current medication regimen as prescribed by them.  Would benefit from therapy, but refuses at this time.  Highly recommended this due to her past trauma history and difficulty with libido.

## 2020-08-23 NOTE — Patient Instructions (Addendum)
Start taking Vitamin B12 1000 MCG daily -- can get over the counter.  Diabetes Mellitus and Nutrition, Adult When you have diabetes, or diabetes mellitus, it is very important to have healthy eating habits because your blood sugar (glucose) levels are greatly affected by what you eat and drink. Eating healthy foods in the right amounts, at about the same times every day, can help you: Control your blood glucose. Lower your risk of heart disease. Improve your blood pressure. Reach or maintain a healthy weight. What can affect my meal plan? Every person with diabetes is different, and each person has different needs for a meal plan. Your health care provider may recommend that you work with a dietitian to make a meal plan that is best for you. Your meal plan may vary depending on factors such as: The calories you need. The medicines you take. Your weight. Your blood glucose, blood pressure, and cholesterol levels. Your activity level. Other health conditions you have, such as heart or kidney disease. How do carbohydrates affect me? Carbohydrates, also called carbs, affect your blood glucose level more than any other type of food. Eating carbs naturally raises the amount of glucose in your blood. Carb counting is a method for keeping track of how many carbs you eat. Counting carbs is important to keep your blood glucose at a healthy level,especially if you use insulin or take certain oral diabetes medicines. It is important to know how many carbs you can safely have in each meal. This is different for every person. Your dietitian can help you calculate how manycarbs you should have at each meal and for each snack. How does alcohol affect me? Alcohol can cause a sudden decrease in blood glucose (hypoglycemia), especially if you use insulin or take certain oral diabetes medicines. Hypoglycemia can be a life-threatening condition. Symptoms of hypoglycemia, such as sleepiness, dizziness, and confusion,  are similar to symptoms of having too much alcohol. Do not drink alcohol if: Your health care provider tells you not to drink. You are pregnant, may be pregnant, or are planning to become pregnant. If you drink alcohol: Do not drink on an empty stomach. Limit how much you use to: 0-1 drink a day for women. 0-2 drinks a day for men. Be aware of how much alcohol is in your drink. In the [place] one drink equals one 12 oz bottle of beer (355 mL), one 5 oz glass of wine (148 mL), or one 1 oz glass of hard liquor (44 mL). Keep yourself hydrated with water, diet soda, or unsweetened iced tea. Keep in mind that regular soda, juice, and other mixers may contain a lot of sugar and must be counted as carbs. What are tips for following this plan?  Reading food labels Start by checking the serving size on the "Nutrition Facts" label of packaged foods and drinks. The amount of calories, carbs, fats, and other nutrients listed on the label is based on one serving of the item. Many items contain more than one serving per package. Check the total grams (g) of carbs in one serving. You can calculate the number of servings of carbs in one serving by dividing the total carbs by 15. For example, if a food has 30 g of total carbs per serving, it would be equal to 2 servings of carbs. Check the number of grams (g) of saturated fats and trans fats in one serving. Choose foods that have a low amount or none of these fats. Check the number  of milligrams (mg) of salt (sodium) in one serving. Most people should limit total sodium intake to less than 2,300 mg per day. Always check the nutrition information of foods labeled as "low-fat" or "nonfat." These foods may be higher in added sugar or refined carbs and should be avoided. Talk to your dietitian to identify your daily goals for nutrients listed on the label. Shopping Avoid buying canned, pre-made, or processed foods. These foods tend to be high in fat, sodium, and  added sugar. Shop around the outside edge of the grocery store. This is where you will most often find fresh fruits and vegetables, bulk grains, fresh meats, and fresh dairy. Cooking Use low-heat cooking methods, such as baking, instead of high-heat cooking methods like deep frying. Cook using healthy oils, such as olive, canola, or sunflower oil. Avoid cooking with butter, cream, or high-fat meats. Meal planning Eat meals and snacks regularly, preferably at the same times every day. Avoid going long periods of time without eating. Eat foods that are high in fiber, such as fresh fruits, vegetables, beans, and whole grains. Talk with your dietitian about how many servings of carbs you can eat at each meal. Eat 4-6 oz (112-168 g) of lean protein each day, such as lean meat, chicken, fish, eggs, or tofu. One ounce (oz) of lean protein is equal to: 1 oz (28 g) of meat, chicken, or fish. 1 egg.  cup (62 g) of tofu. Eat some foods each day that contain healthy fats, such as avocado, nuts, seeds, and fish. What foods should I eat? Fruits Berries. Apples. Oranges. Peaches. Apricots. Plums. Grapes. Mango. Papaya.Pomegranate. Kiwi. Cherries. Vegetables Lettuce. Spinach. Leafy greens, including kale, chard, collard greens, and mustard greens. Beets. Cauliflower. Cabbage. Broccoli. Carrots. Green beans.Tomatoes. Peppers. Onions. Cucumbers. Brussels sprouts. Grains Whole grains, such as whole-wheat or whole-grain bread, crackers, tortillas,cereal, and pasta. Unsweetened oatmeal. Quinoa. Brown or wild rice. Meats and other proteins Seafood. Poultry without skin. Lean cuts of poultry and beef. Tofu. Nuts. Seeds. Dairy Low-fat or fat-free dairy products such as milk, yogurt, and cheese. The items listed above may not be a complete list of foods and beverages you can eat. Contact a dietitian for more information. What foods should I avoid? Fruits Fruits canned with syrup. Vegetables Canned vegetables.  Frozen vegetables with butter or cream sauce. Grains Refined white flour and flour products such as bread, pasta, snack foods, andcereals. Avoid all processed foods. Meats and other proteins Fatty cuts of meat. Poultry with skin. Breaded or fried meats. Processed meat.Avoid saturated fats. Dairy Full-fat yogurt, cheese, or milk. Beverages Sweetened drinks, such as soda or iced tea. The items listed above may not be a complete list of foods and beverages you should avoid. Contact a dietitian for more information. Questions to ask a health care provider Do I need to meet with a diabetes educator? Do I need to meet with a dietitian? What number can I call if I have questions? When are the best times to check my blood glucose? Where to find more information: American Diabetes Association: diabetes.org Academy of Nutrition and Dietetics: www.eatright.[employer] of Diabetes and Digestive and Kidney Diseases: [employer] Association of Diabetes Care and Education Specialists: www.diabeteseducator.org Summary It is important to have healthy eating habits because your blood sugar (glucose) levels are greatly affected by what you eat and drink. A healthy meal plan will help you control your blood glucose and maintain a healthy lifestyle. Your health care provider may recommend that you work with  a dietitian to make a meal plan that is best for you. Keep in mind that carbohydrates (carbs) and alcohol have immediate effects on your blood glucose levels. It is important to count carbs and to use alcohol carefully. This information is not intended to replace advice given to you by your health care provider. Make sure you discuss any questions you have with your healthcare provider. Document Revised: 12/30/2018 Document Reviewed: 12/30/2018 Elsevier Patient Education  2021 [employer].

## 2020-08-23 NOTE — Assessment & Plan Note (Signed)
Chronic, ongoing.  Continue Gabapentin as ordered, refills sent in, plus collaboration with neurology as needed.

## 2020-08-23 NOTE — Assessment & Plan Note (Signed)
I have recommended complete cessation of tobacco use. I have discussed various options available for assistance with tobacco cessation including over the counter methods (Nicotine gum, patch and lozenges). We also discussed prescription options (Chantix, Nicotine Inhaler / Nasal Spray). The patient is not interested in pursuing any prescription tobacco cessation options at this time.  

## 2020-08-24 LAB — MICROALBUMIN, URINE WAIVED
Creatinine, Urine Waived: 100 mg/dL (ref 10–300)
Microalb, Ur Waived: 10 mg/L (ref 0–19)
Microalb/Creat Ratio: <30 mg/g (ref <30)

## 2020-08-24 LAB — PREGNANCY, URINE: Preg Test, Ur: NEGATIVE

## 2020-08-24 NOTE — Progress Notes (Signed)
Contacted via MyChart   Good morning Vanessa Baker, your labs have returned and overall normal.  If any questions let me know. Keep being awesome!!  Thank you for allowing me to participate in your care.  I appreciate you. Kindest regards, Rocky Gladden

## 2020-09-03 DIAGNOSIS — C3400 Malignant neoplasm of unspecified main bronchus: Secondary | ICD-10-CM | POA: Diagnosis not present

## 2020-09-03 DIAGNOSIS — J9601 Acute respiratory failure with hypoxia: Secondary | ICD-10-CM | POA: Diagnosis not present

## 2020-09-03 DIAGNOSIS — J4541 Moderate persistent asthma with (acute) exacerbation: Secondary | ICD-10-CM | POA: Diagnosis not present

## 2020-09-06 ENCOUNTER — Ambulatory Visit (INDEPENDENT_AMBULATORY_CARE_PROVIDER_SITE_OTHER): Payer: Medicare HMO | Admitting: Psychiatry

## 2020-09-06 ENCOUNTER — Other Ambulatory Visit: Payer: Self-pay

## 2020-09-06 ENCOUNTER — Encounter: Payer: Self-pay | Admitting: Psychiatry

## 2020-09-06 VITALS — BP 129/77 | HR 91 | Temp 97.8°F | Wt 188.8 lb

## 2020-09-06 DIAGNOSIS — F1211 Cannabis abuse, in remission: Secondary | ICD-10-CM

## 2020-09-06 DIAGNOSIS — F1011 Alcohol abuse, in remission: Secondary | ICD-10-CM

## 2020-09-06 DIAGNOSIS — F1421 Cocaine dependence, in remission: Secondary | ICD-10-CM

## 2020-09-06 DIAGNOSIS — F4310 Post-traumatic stress disorder, unspecified: Secondary | ICD-10-CM

## 2020-09-06 DIAGNOSIS — G2581 Restless legs syndrome: Secondary | ICD-10-CM | POA: Diagnosis not present

## 2020-09-06 DIAGNOSIS — F3162 Bipolar disorder, current episode mixed, moderate: Secondary | ICD-10-CM | POA: Diagnosis not present

## 2020-09-06 DIAGNOSIS — Z79899 Other long term (current) drug therapy: Secondary | ICD-10-CM

## 2020-09-06 DIAGNOSIS — G2111 Neuroleptic induced parkinsonism: Secondary | ICD-10-CM | POA: Diagnosis not present

## 2020-09-06 DIAGNOSIS — R69 Illness, unspecified: Secondary | ICD-10-CM | POA: Diagnosis not present

## 2020-09-06 MED ORDER — CARBAMAZEPINE 200 MG PO TABS: 200.0000 mg | ORAL_TABLET | Freq: Two times a day (BID) | ORAL | 1 refills | Status: DC

## 2020-09-06 NOTE — Progress Notes (Signed)
[place] MD OP Progress Note  09/06/2020 3:52 PM Donye Mirela Parsley  MRN:  [id number]  Chief Complaint:  Chief Complaint   Follow-up; Anxiety; Depression    HPI: Vanessa Baker is a 45 year old Caucasian female, divorced, has a history of bipolar disorder, PTSD, neuroleptic induced Parkinson's disease, tobacco use disorder, cocaine use disorder, alcohol use disorder, hyperlipidemia, IBS was evaluated in office today.  Patient today reports she is currently depressed.  She reports she feels sad, has concentration problems often.  She reports her sleep is also affected.  She sleeps only 4 to 5 hours or so.  She reports she often feels anxious, worries a lot about different things, is often restless and fidgety.  She reports that she is in a lot of pain of her back and her hip.  She reports because of the pain she has difficulty functioning.  It is difficult for her to stand or walk.  She currently takes a muscle relaxant for her pain.  She has been unable to follow-up with her pain provider due to financial reasons.  She however agrees to give her primary care provider call.  Patient reports she is compliant on her medications as prescribed.  She continues to have tremors of bilateral upper extremities likely neuroleptic induced.  She is currently on Cogentin which helps to some extent.  She is also on lithium which could be exacerbating her tremors.  Patient reports lithium was not helpful for her mood anymore.  Patient denies any suicidality, homicidality or perceptual disturbances.  Patient denies any other concerns today.  Visit Diagnosis:    ICD-10-CM   1. Bipolar 1 disorder, mixed, moderate (HCC)  F31.62 carbamazepine (TEGRETOL) 200 MG tablet    2. PTSD (post-traumatic stress disorder)  F43.10 carbamazepine (TEGRETOL) 200 MG tablet    3. Neuroleptic induced parkinsonism ([place])  G21.11     4. Restless leg syndrome  G25.81     5. Cocaine use disorder, moderate, in sustained  remission (HCC)  F14.21     6. Alcohol use disorder, mild, in early remission  F10.11     7. Cannabis use disorder, mild, in early remission  F12.11     8. High risk medication use  Z79.899 Carbamazepine level, total    CMP and Liver    Urine drugs of abuse scrn w alc, routine (Ref Lab)      Past Psychiatric History: I have reviewed past psychiatric history from progress note on 04/02/2017.  Past trials of Seroquel, Latuda, Ambien, Klonopin, Topamax, gabapentin, Trintellix, Abilify, Rexulti, Artane  Past Medical History:  Past Medical History:  Diagnosis Date   Allergy    Anxiety    Asthma    Bipolar disorder ([place])    Bursitis    Depression    Diabetes mellitus without complication ([place])    Essential hypertension 03/28/2017   Frequent headaches    GERD (gastroesophageal reflux disease)    Headache    High cholesterol    Hypertension    Irritable bowel syndrome (IBS)    Irritable colon 02/23/2010   Migraine 04/02/2017   Obesity (BMI 30-39.9) 11/25/2013   Seizure ([place]) [id number]   Seizures ([place])    Tremor     Past Surgical History:  Procedure Laterality Date   CHOLECYSTECTOMY N/A 04/27/2019   Procedure: LAPAROSCOPIC CHOLECYSTECTOMY;  Surgeon: Fredirick Maudlin, MD;  Location: [facility] ORS;  Service: General;  Laterality: N/A;    Family Psychiatric History: Reviewed family psychiatric history from progress note on 04/02/2017  Family  History:  Family History  Problem Relation Age of Onset   Bipolar disorder Mother    Schizophrenia Mother    Hypertension Mother    Diabetes Mother    Cancer Mother    Anxiety disorder Mother    ADD / ADHD Mother    Alcohol abuse Mother    Drug abuse Mother    Heart disease Mother    Bipolar disorder Sister    Anxiety disorder Sister    Drug abuse Sister    Bipolar disorder Brother    Anxiety disorder Brother    Drug abuse Brother    Hypertension Father    Cancer Father    Diabetes Father    Breast cancer Maternal Aunt    Leukemia  Maternal Aunt    Breast cancer Paternal Aunt    Breast cancer Maternal Grandmother    Alcohol abuse Maternal Grandmother    Bipolar disorder Maternal Grandmother    Breast cancer Paternal Grandmother    Parkinson's disease Paternal Grandmother    Alcohol abuse Maternal Grandfather    Prostate cancer Maternal Grandfather    Alcohol abuse Maternal Uncle    Multiple myeloma Maternal Uncle    ADD / ADHD Son    Throat cancer Maternal Aunt    Tremor Neg Hx     Social History: Reviewed social history from progress note on 04/02/2017 Social History   Socioeconomic History   Marital status: Married    Spouse name: dewey    Number of children: 1   Years of education: 26   Highest education level: Associate degree: occupational, Hotel manager, or vocational program  Occupational History   Occupation: disability  Tobacco Use   Smoking status: Former    Packs/day: 0.50    Years: 27.00    Pack years: 13.50    Types: Cigarettes    Quit date: 04/26/2019    Years since quitting: 1.3   Smokeless tobacco: Never  Vaping Use   Vaping Use: Former  Substance and Sexual Activity   Alcohol use: Yes   Drug use: No   Sexual activity: Yes    Partners: Male    Birth control/protection: None  Other Topics Concern   Not on file  Social History Narrative   Lives with husband, [name] (on [place])   Right-handed   Caffeine use: 1 cup coffee a day, 2 soft drinks per day   Social Determinants of Health   Financial Resource Strain: Medium Risk   Difficulty of Paying Living Expenses: Somewhat hard  Food Insecurity: No Food Insecurity   Worried About Charity fundraiser in the Last Year: Never true   [place] in the Last Year: Never true  Transportation Needs: No Transportation Needs   Lack of Transportation (Medical): No   Lack of Transportation (Non-Medical): No  Physical Activity: Inactive   Days of Exercise per Week: 0 days   Minutes of Exercise per Session: 0 min  Stress: Stress  Concern Present   Feeling of Stress : Very much  Social Connections: Not on file    Allergies:  Allergies  Allergen Reactions   Amitriptyline Other (See Comments)    Confusion, paralysis   Wellbutrin [Bupropion]     falls    Metabolic Disorder Labs: Lab Results  Component Value Date   HGBA1C 6.1 (H) 08/19/2020   MPG 111.15 07/08/2019   MPG 128.37 04/27/2019   Lab Results  Component Value Date   PROLACTIN 8.9 07/08/2019   PROLACTIN 4.6 (  L) 04/22/2017   Lab Results  Component Value Date   CHOL 123 08/19/2020   TRIG 218 (H) 08/19/2020   HDL 36 (L) 08/19/2020   CHOLHDL 3.2 07/08/2019   VLDL 63 (H) 07/08/2019   LDLCALC 52 08/19/2020   LDLCALC 95 12/15/2019   Lab Results  Component Value Date   TSH 2.240 08/19/2020   TSH 2.880 01/13/2020    Therapeutic Level Labs: Lab Results  Component Value Date   LITHIUM 0.7 08/19/2020   LITHIUM 0.35 (L) 03/21/2020   Lab Results  Component Value Date   VALPROATE 103 (H) 04/26/2019   VALPROATE 61.4 08/13/2017   No components found for:  CBMZ  Current Medications: Current Outpatient Medications  Medication Sig Dispense Refill   ACCU-CHEK AVIVA PLUS test strip USE 1 STRIP TO CHECK GLUCOSE UP TO 3 TIMES DAILY AS DIRECTED. 300 each 1   Accu-Chek Softclix Lancets lancets Use to check blood sugar up to 3 times daily as instructed 300 each 1   albuterol (VENTOLIN HFA) 108 (90 Base) MCG/ACT inhaler INHALE 2 PUFFS BY MOUTH EVERY 6 HOURS AS NEEDED FOR WHEEZING AND FOR SHORTNESS OF BREATH 18 g 4   atorvastatin (LIPITOR) 40 MG tablet Take 1 tablet (40 mg total) by mouth daily. 90 tablet 4   baclofen (LIORESAL) 10 MG tablet Take 1 tablet by mouth three times daily as needed for muscle spasm 90 tablet 3   benztropine (COGENTIN) 1 MG tablet Take 0.5-1 tablets (0.5-1 mg total) by mouth 3 (three) times daily as needed for tremors. 270 tablet 0   blood glucose meter kit and supplies Dispense based on patient and insurance preference. Use up  to four times daily as directed. (FOR ICD-10 E10.9, E11.9). 1 each 0   Blood Glucose Monitoring Suppl (ACCU-CHEK AVIVA PLUS) w/Device KIT Use to check blood sugar up to 3 times daily 1 kit 0   Calcium Carbonate+Vitamin D (CALCIUM 600+D3) 600-200 MG-UNIT TABS Take 1 tablet by mouth daily. 90 tablet 4   carbamazepine (TEGRETOL) 200 MG tablet Take 1 tablet (200 mg total) by mouth 2 (two) times daily. 60 tablet 1   cetirizine (ZYRTEC) 10 MG tablet Take 1 tablet (10 mg total) by mouth daily as needed for allergies (itching). 30 tablet 0   Dulaglutide (TRULICITY) 1.5 ML/4.6TK SOPN INJECT 1.5MG SUBCUTANEOUSLY ONCE WEEKLY 12 mL 4   fluticasone-salmeterol (ADVAIR HFA) 115-21 MCG/ACT inhaler Inhale 2 puffs into the lungs 2 (two) times daily. 1 each 12   gabapentin (NEURONTIN) 300 MG capsule TAKE 1 CAPSULE BY MOUTH TWICE DAILY FOR  RESTLESS  LEGS 180 capsule 4   lisinopril (ZESTRIL) 10 MG tablet Take 1 tablet (10 mg total) by mouth daily. 90 tablet 4   metFORMIN (GLUCOPHAGE) 500 MG tablet Take 1 tablet (500 mg total) by mouth 2 (two) times daily. 180 tablet 4   Methylcobalamin (B-12) 1000 MCG TBDP Take 1,000 mcg by mouth daily. 90 tablet 4   norethindrone-ethinyl estradiol-FE (JUNEL FE 1/20) 1-20 MG-MCG tablet Take 1 tablet by mouth daily. 84 tablet 4   OLANZapine (ZYPREXA) 20 MG tablet TAKE 1 TABLET BY MOUTH AT BEDTIME 90 tablet 0   omega-3 acid ethyl esters (LOVAZA) 1 g capsule Take 1 capsule (1 g total) by mouth 2 (two) times daily. 180 capsule 4   omeprazole (PRILOSEC) 20 MG capsule Take 1 capsule (20 mg total) by mouth daily. 90 capsule 4   primidone (MYSOLINE) 50 MG tablet Take 1 tablet (50 mg total) by mouth at  bedtime. 90 tablet 4   sertraline (ZOLOFT) 50 MG tablet Take 1 tablet (50 mg total) by mouth daily. 90 tablet 0   tiZANidine (ZANAFLEX) 4 MG tablet      traZODone (DESYREL) 100 MG tablet TAKE 1 & 1/2 (ONE & ONE-HALF) TABLETS BY MOUTH AT BEDTIME FOR SLEEP 135 tablet 0   triamcinolone (KENALOG)  0.1 % Apply 1 application topically 2 (two) times daily. 30 g 2   No current facility-administered medications for this visit.     Musculoskeletal: Strength & Muscle Tone: within normal limits Gait & Station: normal Patient leans: N/A  Psychiatric Specialty Exam: Review of Systems  Musculoskeletal:  Positive for back pain.       Hip pain  Psychiatric/Behavioral:  Positive for decreased concentration, dysphoric mood and sleep disturbance. The patient is nervous/anxious.   All other systems reviewed and are negative.  Blood pressure 129/77, pulse 91, temperature 97.8 F (36.6 C), temperature source Temporal, weight 188 lb 12.8 oz (85.6 kg), last menstrual period 11/26/2018.Body mass index is 34.05 kg/m.  General Appearance: Casual  Eye Contact:  Fair  Speech:  Clear and Coherent  Volume:  Normal  Mood:  Anxious and Depressed  Affect:  Congruent  Thought Process:  Goal Directed and Descriptions of Associations: Intact  Orientation:  Full (Time, Place, and Person)  Thought Content: Logical   Suicidal Thoughts:  No  Homicidal Thoughts:  No  Memory:  Immediate;   Fair Recent;   Fair Remote;   Fair  Judgement:  Fair  Insight:  Fair  Psychomotor Activity:  Tremor  Concentration:  Concentration: Fair and Attention Span: Fair  Recall:  [employer] of Knowledge: Fair  Language: Fair  Akathisia:  No  Handed:  Right  AIMS (if indicated): done  Assets:  Communication Skills Desire for Improvement Housing Intimacy  ADL's:  Intact  Cognition: WNL  Sleep:  Poor   Screenings: [place] Office Visit from 04/07/2018 in [place] Office Visit from 01/01/2018 in [place] Total Score 10 1      [place] Office Visit from 09/06/2020 in [place] Visit from 08/25/2019 in [place] Visit from 12/16/2018 in [facility]  Office Visit from 09/06/2017 in [facility]  Total GAD-7 Score _0 JKD3-2    [place] Office Visit from 09/06/2020 in [place] Office Visit from 08/23/2020 in [facility] Video Visit from 03/22/2020 in [place] Office Visit from 02/25/2020 in [place] from 01/08/2020 in [place]  PHQ-2 Total Score _1 0 6  PHQ-9 Total Score 9 14 -- 11 14      [place] Visit from 09/06/2020 in [place] Video Visit from 03/22/2020 in [place] No Risk No Risk        Assessment and Plan: Laurine Kuyper is a 45 year old Caucasian female who has a history of bipolar disorder, panic disorder, PTSD, drug induced Parkinson's disease, diabetes mellitus, polysubstance abuse was evaluated in office today.  Patient with multiple medical problems currently reports she is struggling with mood symptoms, sleep problems, does have pain which does have an impact on her mood and sleep.  She will benefit from the following plan.  Plan  Bipolar  disorder type I mixed moderate-unstable Discontinue lithium. Start carbamazepine 200 mg p.o. twice daily Continue Zyprexa 20 mg p.o. nightly.  We will consider reducing the dosage in the future if she has a good response to the carbamazepine. Continue Zoloft as prescribed  Panic disorder-stable Zoloft as prescribed at 50 mg p.o. daily  PTSD-stable Zoloft 50 mg p.o. daily  Insomnia-unstable Patient was referred for sleep study-noncompliant Continue trazodone 100 mg p.o. nightly She will need sufficient pain management since pain does have an impact on her sleep.  Patient with financial problems is unable to follow-up with pain provider.  Patient advised to follow-up with primary provider.  Neuroleptic induced Parkinson's  disease-unstable Cogentin 0.5 to 1 mg 3 times a day as needed. We will consider reducing the olanzapine once she has a good response to carbamazepine. Patient was referred to neurologist in the past however has been noncompliant.  Alcohol use disorder/cocaine and cannabis use disorder in remission Will get urine drug screen.   High risk medication use-I have reviewed lithium level dated 08/19/2020-0.7-therapeutic.  I have reviewed hemoglobin A1c-6.1-slightly elevated-discussed with patient. Will order carbamazepine level, urine drug screen, CMP with liver.  Patient to go to [facility] in a week after starting the medication.  Provided medication education.  Discussed adverse side effects.   Follow-up in clinic in 10 days or sooner if needed.  This note was generated in part or whole with voice recognition software. Voice recognition is usually quite accurate but there are transcription errors that can and very often do occur. I apologize for any typographical errors that were not detected and corrected.       Ursula Alert, MD 09/06/2020, 3:52 PM

## 2020-09-15 ENCOUNTER — Ambulatory Visit: Payer: Medicare HMO | Admitting: Psychiatry

## 2020-09-20 ENCOUNTER — Other Ambulatory Visit: Payer: Self-pay

## 2020-09-20 ENCOUNTER — Ambulatory Visit
Admission: RE | Admit: 2020-09-20 | Discharge: 2020-09-20 | Disposition: A | Discharge status: Home / Self Care | Payer: Medicare HMO | Attending: Nurse Practitioner | Admitting: Nurse Practitioner

## 2020-09-20 ENCOUNTER — Ambulatory Visit
Admission: RE | Admit: 2020-09-20 | Discharge: 2020-09-20 | Disposition: A | Discharge status: Home / Self Care | Payer: Medicare HMO | Source: Ambulatory Visit | Attending: Nurse Practitioner | Admitting: Nurse Practitioner

## 2020-09-20 ENCOUNTER — Encounter: Payer: Self-pay | Admitting: Nurse Practitioner

## 2020-09-20 ENCOUNTER — Ambulatory Visit (INDEPENDENT_AMBULATORY_CARE_PROVIDER_SITE_OTHER): Payer: Medicare HMO | Admitting: Nurse Practitioner

## 2020-09-20 VITALS — BP 114/73 | HR 85 | Temp 98.3°F | Wt 186.2 lb

## 2020-09-20 DIAGNOSIS — L905 Scar conditions and fibrosis of skin: Secondary | ICD-10-CM | POA: Insufficient documentation

## 2020-09-20 DIAGNOSIS — M79671 Pain in right foot: Secondary | ICD-10-CM

## 2020-09-20 DIAGNOSIS — M7989 Other specified soft tissue disorders: Secondary | ICD-10-CM | POA: Diagnosis not present

## 2020-09-20 NOTE — Progress Notes (Signed)
Good morning please check to ensure patient receive MyChart message below, contact her via phone as does not always check MyChart: Good evening Vanessa Baker, your imaging returned showing no fracture. There is some swelling to the area of tenderness, so most likely bruising and inflammation to tissue.  I recommend continue wearing your compression sock daily.  Rest and ice often.  Apply Voltaren gel as needed.  Return to office for worsening or ongoing pain.  Have a great day!! Keep being awesome!!  Thank you for allowing me to participate in your care.  I appreciate you. Kindest regards, Sache Sane

## 2020-09-20 NOTE — Assessment & Plan Note (Signed)
Acute for weeks after injury.  Will obtain imaging to further view, due to tenderness being present to upper aspect.  Suspect more inflammation and bruising.  Recommend she rest and apply ice at least 3 times during day.  Wear compression support to foot and take Tylenol as needed.  Return to office in 3 weeks, sooner if worsening pain.  Refer to ortho as needed.

## 2020-09-20 NOTE — Progress Notes (Signed)
BP 114/73   Pulse 85   Temp 98.3 F (36.8 C) (Oral)   Wt 186 lb 3.2 oz (84.5 kg)   LMP 11/26/2018 Comment: pt denies pregnancy-states no period in two years  SpO2 95%   BMI 33.58 kg/m    Subjective:    Patient ID: Vanessa Baker, female    DOB: [birth year], 45 y.o.   MRN: [id number]  HPI: Vanessa Baker is a 45 y.o. female  Chief Complaint  Patient presents with   Post-op Problem    Patient states she had gallbladder surgery about a year ago and states since then she notices a lump in her abdomen and denies having any pain associated with it.    Foot Pain    Patient states she tripped over a root and since then she says it has been painful to walk on it. Patient states she has tried Ibuprofen and Tylenol.    SKIN MASS Has noticed a "lump" to area where she had past gall bladder surgery over a year ago.  Has noticed this area since surgery.  Denies any pain to area and no change in size.   Duration: months Location: middle abdomen Painful: no Itching: no Onset: sudden Context: not changing Associated signs and symptoms: none History of skin cancer: no History of precancerous skin lesions: no Family history of skin cancer: no   FOOT PAIN (RIGHT) Tripped over a root about 3 weeks ago, has been hurting since that time.  Feels a little bit warm and has some swelling. Duration: weeks Involved foot: right Mechanism of injury: trauma Location: top of foot Onset: gradual  Severity: 8/10  Quality:  sharp, aching, and throbbing Frequency: constant Radiation: no Aggravating factors: weight bearing, walking, and movement  Alleviating factors:  compression sleeve, ice, APAP, and NSAIDs  Status: stable Treatments attempted: rest, ice, APAP, and ibuprofen  Relief with NSAIDs?:  no Weakness with weight bearing or walking: no Morning stiffness:  a little bit Swelling: no Redness: no Bruising: no Paresthesias / decreased sensation: no  Fevers:no   Relevant  past medical, surgical, family and social history reviewed and updated as indicated. Interim medical history since our last visit reviewed. Allergies and medications reviewed and updated.  Review of Systems  Constitutional:  Negative for activity change, appetite change, diaphoresis, fatigue and fever.  Respiratory:  Negative for cough, chest tightness and shortness of breath.   Cardiovascular:  Negative for chest pain, palpitations and leg swelling.  Gastrointestinal: Negative.   Endocrine: Negative for cold intolerance, heat intolerance, polydipsia, polyphagia and polyuria.  Musculoskeletal:  Positive for arthralgias.  Neurological: Negative.   Psychiatric/Behavioral:  Positive for sleep disturbance. Negative for decreased concentration, self-injury and suicidal ideas. The patient is not nervous/anxious.    Per HPI unless specifically indicated above     Objective:    BP 114/73   Pulse 85   Temp 98.3 F (36.8 C) (Oral)   Wt 186 lb 3.2 oz (84.5 kg)   LMP 11/26/2018 Comment: pt denies pregnancy-states no period in two years  SpO2 95%   BMI 33.58 kg/m   Wt Readings from Last 3 Encounters:  09/20/20 186 lb 3.2 oz (84.5 kg)  08/23/20 185 lb (83.9 kg)  03/28/20 174 lb 6.4 oz (79.1 kg)    Physical Exam Vitals and nursing note reviewed.  Constitutional:      General: She is awake. She is not in acute distress.    Appearance: She is well-developed and well-groomed. She is obese.  She is not ill-appearing or toxic-appearing.  HENT:     Head: Normocephalic.     Right Ear: Hearing normal.     Left Ear: Hearing normal.     Mouth/Throat:     Pharynx: Oropharynx is clear.  Eyes:     General: Lids are normal.        Right eye: No discharge.        Left eye: No discharge.     Conjunctiva/sclera: Conjunctivae normal.     Pupils: Pupils are equal, round, and reactive to light.  Neck:     Thyroid: No thyromegaly.     Vascular: No carotid bruit.  Cardiovascular:     Rate and Rhythm:  Normal rate and regular rhythm.     Pulses:          Dorsalis pedis pulses are 2+ on the right side and 2+ on the left side.       Posterior tibial pulses are 2+ on the right side and 2+ on the left side.     Heart sounds: Normal heart sounds. No murmur heard.   No gallop.  Pulmonary:     Effort: Pulmonary effort is normal. No accessory muscle usage or respiratory distress.     Breath sounds: Normal breath sounds.  Abdominal:     General: Bowel sounds are normal.     Palpations: Abdomen is soft.     Tenderness: There is no abdominal tenderness.     Comments: Multiple scars to abdomen.  Small scar, approx 3 cm, to umbilicus area with scar tissue noted underneath.  No discomfort.  Musculoskeletal:     Cervical back: Normal range of motion and neck supple.     Right lower leg: No edema.     Left lower leg: No edema.     Right foot: Decreased range of motion.     Left foot: Normal range of motion.  Feet:     Right foot:     Protective Sensation: 10 sites tested.  10 sites sensed.     Skin integrity: Skin integrity normal.     Toenail Condition: Right toenails are normal.     Left foot:     Protective Sensation: 10 sites tested.  10 sites sensed.     Skin integrity: Skin integrity normal.     Toenail Condition: Left toenails are normal.     Comments: Tenderness to upper aspect of foot with discomfort on all ROM movements.  Mild edema, no bruising.   Skin:    General: Skin is warm and dry.  Neurological:     Mental Status: She is alert and oriented to person, place, and time.     Deep Tendon Reflexes: Reflexes are normal and symmetric.     Reflex Scores:      Brachioradialis reflexes are 2+ on the right side and 2+ on the left side.      Patellar reflexes are 2+ on the right side and 2+ on the left side. Psychiatric:        Attention and Perception: Attention normal.        Mood and Affect: Mood normal.        Speech: Speech normal.        Behavior: Behavior normal. Behavior is  cooperative.        Thought Content: Thought content normal.    Results for orders placed or performed in visit on 08/23/20  Pregnancy, urine  Result Value Ref Range   Preg Test,  Ur Negative Negative  Microalbumin, Urine Waived  Result Value Ref Range   Microalb, Ur Waived 10 0 - 19 mg/L   Creatinine, Urine Waived 100 10 - 300 mg/dL   Microalb/Creat Ratio <30 <30 mg/g      Assessment & Plan:   Problem List Items Addressed This Visit       Other   Foot pain, right - Primary    Acute for weeks after injury.  Will obtain imaging to further view, due to tenderness being present to upper aspect.  Suspect more inflammation and bruising.  Recommend she rest and apply ice at least 3 times during day.  Wear compression support to foot and take Tylenol as needed.  Return to office in 3 weeks, sooner if worsening pain.  Refer to ortho as needed.      Relevant Orders   DG Foot Complete Right   Scar tissue    To abdomen post surgeries.  Reassurance provided.        Follow up plan: Return in about 3 weeks (around 10/11/2020) for right foot pain.

## 2020-09-20 NOTE — Patient Instructions (Signed)

## 2020-09-20 NOTE — Assessment & Plan Note (Signed)
To abdomen post surgeries.  Reassurance provided.

## 2020-09-21 ENCOUNTER — Other Ambulatory Visit: Payer: Self-pay | Admitting: Nurse Practitioner

## 2020-09-21 ENCOUNTER — Telehealth: Payer: Self-pay | Admitting: *Deleted

## 2020-09-21 DIAGNOSIS — E669 Obesity, unspecified: Secondary | ICD-10-CM

## 2020-09-21 DIAGNOSIS — E1169 Type 2 diabetes mellitus with other specified complication: Secondary | ICD-10-CM

## 2020-09-21 MED ORDER — TRULICITY 1.5 MG/0.5ML ~~LOC~~ SOAJ: SUBCUTANEOUS | 4 refills | Status: DC

## 2020-09-21 NOTE — Telephone Encounter (Signed)
Patient requesting voltaren gel to be refilled. Voltaren gel not on med list. Please advise

## 2020-09-22 NOTE — Telephone Encounter (Signed)
Routing to provider to advise if voltaren RX is appropriate.

## 2020-09-29 ENCOUNTER — Ambulatory Visit: Payer: Medicare HMO | Admitting: Psychiatry

## 2020-10-04 ENCOUNTER — Ambulatory Visit: Payer: Medicare HMO | Admitting: Nurse Practitioner

## 2020-10-04 DIAGNOSIS — J4541 Moderate persistent asthma with (acute) exacerbation: Secondary | ICD-10-CM | POA: Diagnosis not present

## 2020-10-04 DIAGNOSIS — C3400 Malignant neoplasm of unspecified main bronchus: Secondary | ICD-10-CM | POA: Diagnosis not present

## 2020-10-04 DIAGNOSIS — J9601 Acute respiratory failure with hypoxia: Secondary | ICD-10-CM | POA: Diagnosis not present

## 2020-10-05 ENCOUNTER — Other Ambulatory Visit
Admission: RE | Admit: 2020-10-05 | Discharge: 2020-10-05 | Disposition: A | Discharge status: Home / Self Care | Payer: Medicare HMO | Source: Ambulatory Visit | Attending: Psychiatry | Admitting: Psychiatry

## 2020-10-05 DIAGNOSIS — R69 Illness, unspecified: Secondary | ICD-10-CM | POA: Diagnosis not present

## 2020-10-05 DIAGNOSIS — F1910 Other psychoactive substance abuse, uncomplicated: Secondary | ICD-10-CM | POA: Insufficient documentation

## 2020-10-05 DIAGNOSIS — Z79899 Other long term (current) drug therapy: Secondary | ICD-10-CM | POA: Diagnosis not present

## 2020-10-05 DIAGNOSIS — F4310 Post-traumatic stress disorder, unspecified: Secondary | ICD-10-CM | POA: Insufficient documentation

## 2020-10-05 LAB — COMPREHENSIVE METABOLIC PANEL
ALT: 37 U/L (ref 0–44)
AST: 35 U/L (ref 15–41)
Albumin: 4.2 g/dL (ref 3.5–5.0)
Alkaline Phosphatase: 85 U/L (ref 38–126)
Anion gap: 9 (ref 5–15)
BUN: 11 mg/dL (ref 6–20)
CO2: 24 mmol/L (ref 22–32)
Calcium: 9.4 mg/dL (ref 8.9–10.3)
Chloride: 103 mmol/L (ref 98–111)
Creatinine, Ser: 0.62 mg/dL (ref 0.44–1.00)
GFR, Estimated: >60 mL/min (ref >60)
Glucose, Bld: 126 mg/dL — ABNORMAL HIGH (ref 70–99)
Potassium: 4 mmol/L (ref 3.5–5.1)
Sodium: 136 mmol/L (ref 135–145)
Total Bilirubin: 0.3 mg/dL (ref 0.3–1.2)
Total Protein: 7.5 g/dL (ref 6.5–8.1)

## 2020-10-05 LAB — CARBAMAZEPINE LEVEL, TOTAL: Carbamazepine Lvl: 3.8 ug/mL — ABNORMAL LOW (ref 4.0–12.0)

## 2020-10-05 LAB — BILIRUBIN, DIRECT: Bilirubin, Direct: <0.1 mg/dL (ref 0.0–0.2)

## 2020-10-09 LAB — DRUG PROFILE 799023
Amobarbital, Ur: NEGATIVE
BARBITURATES: POSITIVE — AB
Butalbital, Ur: NEGATIVE
Pentobarbital, Ur: NEGATIVE
Phenobarbital, GC/MS: 606 ng/mL
Phenobarbital,Ur: POSITIVE — AB
Secobarbital, Ur: NEGATIVE

## 2020-10-09 LAB — URINE DRUGS OF ABUSE SCREEN W ALC, ROUTINE (REF LAB)
Amphetamines, Urine: NEGATIVE ng/mL
Benzodiazepine Quant, Ur: NEGATIVE ng/mL
Cannabinoid Quant, Ur: NEGATIVE ng/mL
Cocaine (Metab.): NEGATIVE ng/mL
Ethanol U, Quan: NEGATIVE %
Methadone Screen, Urine: NEGATIVE ng/mL
Opiate Quant, Ur: NEGATIVE ng/mL
Phencyclidine, Ur: NEGATIVE ng/mL
Propoxyphene, Urine: NEGATIVE ng/mL

## 2020-10-18 ENCOUNTER — Ambulatory Visit: Payer: Medicare HMO | Admitting: Nurse Practitioner

## 2020-10-22 ENCOUNTER — Other Ambulatory Visit: Payer: Self-pay | Admitting: Psychiatry

## 2020-10-22 DIAGNOSIS — G2111 Neuroleptic induced parkinsonism: Secondary | ICD-10-CM

## 2020-10-25 ENCOUNTER — Encounter: Payer: Self-pay | Admitting: Nurse Practitioner

## 2020-10-25 ENCOUNTER — Other Ambulatory Visit: Payer: Self-pay

## 2020-10-25 ENCOUNTER — Other Ambulatory Visit: Payer: Self-pay | Admitting: Nurse Practitioner

## 2020-10-25 ENCOUNTER — Ambulatory Visit (INDEPENDENT_AMBULATORY_CARE_PROVIDER_SITE_OTHER): Payer: Medicare HMO | Admitting: Nurse Practitioner

## 2020-10-25 VITALS — BP 120/70 | HR 92 | Temp 100.0°F | Wt 185.0 lb

## 2020-10-25 DIAGNOSIS — Z23 Encounter for immunization: Secondary | ICD-10-CM

## 2020-10-25 DIAGNOSIS — G2581 Restless legs syndrome: Secondary | ICD-10-CM

## 2020-10-25 DIAGNOSIS — M79671 Pain in right foot: Secondary | ICD-10-CM | POA: Diagnosis not present

## 2020-10-25 MED ORDER — PREGABALIN 50 MG PO CAPS: 50.0000 mg | ORAL_CAPSULE | Freq: Every day | ORAL | 1 refills | Status: DC

## 2020-10-25 MED ORDER — LISINOPRIL 10 MG PO TABS: 10.0000 mg | ORAL_TABLET | Freq: Every day | ORAL | 4 refills | Status: DC

## 2020-10-25 NOTE — Progress Notes (Signed)
BP 120/70   Pulse 92   Temp 100 F (37.8 C) (Oral)   Wt 185 lb (83.9 kg)   LMP 11/26/2018 Comment: pt denies pregnancy-states no period in two years  SpO2 96%   BMI 33.36 kg/m    Subjective:    Patient ID: Vanessa Baker, female    DOB: [birth year], 45 y.o.   MRN: [id number]  HPI: Vanessa Baker is a 45 y.o. female  Chief Complaint  Patient presents with   Foot Pain    Patient is here for follow up on foot pain. Patient states she is feeling a lot better. Patient states it just took a long time to heal.   Medication Problem    Patient states she would like to discuss having a different medication as she is taking up to 5 Gabapentin and only supposed 2 tablets and she is not noticing any change and not helping her.    FOOT PAIN (RIGHT) Follow-up for foot pain, imaging on 09/20/20 noted on fracture, swelling only to foot.  Reports this has improved and no further swelling.  RLS She is taking Gabapentin for restless leg syndrome -- ordered 300 MG BID, but is taking up to 1 tablets in morning and 4 tablets at night with no benefit.  Is taking Vitamin B12 at home for low levels, last level 299 in July. Duration: ongoing Mechanism of injury: unknown Location: both legs Onset: gradual  Severity: 10/10 at worst Quality:  pins and needles + restless Frequency: constant Radiation: no Aggravating factors: weight bearing, walking, and movement  Alleviating factors: nothing Status: stable Treatments attempted: Gabapentin Relief with NSAIDs?:  no Weakness with weight bearing or walking: no Morning stiffness: none Swelling: no Redness: no Bruising: no Paresthesias / decreased sensation: at times Fevers:no   Relevant past medical, surgical, family and social history reviewed and updated as indicated. Interim medical history since our last visit reviewed. Allergies and medications reviewed and updated.  Review of Systems  Constitutional:  Negative for activity  change, appetite change, diaphoresis, fatigue and fever.  Respiratory:  Negative for cough, chest tightness and shortness of breath.   Cardiovascular:  Negative for chest pain, palpitations and leg swelling.  Gastrointestinal: Negative.   Endocrine: Negative for cold intolerance, heat intolerance, polydipsia, polyphagia and polyuria.  Musculoskeletal:  Positive for arthralgias.  Neurological: Negative.   Psychiatric/Behavioral:  Positive for sleep disturbance. Negative for decreased concentration, self-injury and suicidal ideas. The patient is not nervous/anxious.    Per HPI unless specifically indicated above     Objective:    BP 120/70   Pulse 92   Temp 100 F (37.8 C) (Oral)   Wt 185 lb (83.9 kg)   LMP 11/26/2018 Comment: pt denies pregnancy-states no period in two years  SpO2 96%   BMI 33.36 kg/m   Wt Readings from Last 3 Encounters:  10/25/20 185 lb (83.9 kg)  09/20/20 186 lb 3.2 oz (84.5 kg)  08/23/20 185 lb (83.9 kg)    Physical Exam Vitals and nursing note reviewed.  Constitutional:      General: She is awake. She is not in acute distress.    Appearance: She is well-developed and well-groomed. She is obese. She is not ill-appearing or toxic-appearing.  HENT:     Head: Normocephalic.     Right Ear: Hearing normal.     Left Ear: Hearing normal.     Mouth/Throat:     Pharynx: Oropharynx is clear.  Eyes:     General:  Lids are normal.        Right eye: No discharge.        Left eye: No discharge.     Conjunctiva/sclera: Conjunctivae normal.     Pupils: Pupils are equal, round, and reactive to light.  Neck:     Thyroid: No thyromegaly.     Vascular: No carotid bruit.  Cardiovascular:     Rate and Rhythm: Normal rate and regular rhythm.     Pulses:          Dorsalis pedis pulses are 2+ on the right side and 2+ on the left side.       Posterior tibial pulses are 2+ on the right side and 2+ on the left side.     Heart sounds: Normal heart sounds. No murmur heard.    No gallop.  Pulmonary:     Effort: Pulmonary effort is normal. No accessory muscle usage or respiratory distress.     Breath sounds: Normal breath sounds.  Abdominal:     General: Bowel sounds are normal.     Palpations: Abdomen is soft.     Tenderness: There is no abdominal tenderness.  Musculoskeletal:     Cervical back: Normal range of motion and neck supple.     Right lower leg: No edema.     Left lower leg: No edema.     Right foot: Normal range of motion.     Left foot: Normal range of motion.  Feet:     Right foot:     Protective Sensation: 10 sites tested.  10 sites sensed.     Skin integrity: Skin integrity normal.     Toenail Condition: Right toenails are normal.     Left foot:     Protective Sensation: 10 sites tested.  10 sites sensed.     Skin integrity: Skin integrity normal.     Toenail Condition: Left toenails are normal.  Skin:    General: Skin is warm and dry.  Neurological:     Mental Status: She is alert and oriented to person, place, and time.     Deep Tendon Reflexes: Reflexes are normal and symmetric.     Reflex Scores:      Brachioradialis reflexes are 2+ on the right side and 2+ on the left side.      Patellar reflexes are 2+ on the right side and 2+ on the left side. Psychiatric:        Attention and Perception: Attention normal.        Mood and Affect: Mood normal.        Speech: Speech normal.        Behavior: Behavior normal. Behavior is cooperative.        Thought Content: Thought content normal.    Results for orders placed or performed during the hospital encounter of 10/05/20  Comprehensive metabolic panel  Result Value Ref Range   Sodium 136 135 - 145 mmol/L   Potassium 4.0 3.5 - 5.1 mmol/L   Chloride 103 98 - 111 mmol/L   CO2 24 22 - 32 mmol/L   Glucose, Bld 126 (H) 70 - 99 mg/dL   BUN 11 6 - 20 mg/dL   Creatinine, Ser 0.62 0.44 - 1.00 mg/dL   Calcium 9.4 8.9 - 10.3 mg/dL   Total Protein 7.5 6.5 - 8.1 g/dL   Albumin 4.2 3.5 - 5.0  g/dL   AST 35 15 - 41 U/L   ALT 37 0 - 44 U/L  Alkaline Phosphatase 85 38 - 126 U/L   Total Bilirubin 0.3 0.3 - 1.2 mg/dL   GFR, Estimated >60 >60 mL/min   Anion gap 9 5 - 15  Carbamazepine level, total  Result Value Ref Range   Carbamazepine Lvl 3.8 (L) 4.0 - 12.0 ug/mL  Bilirubin, direct  Result Value Ref Range   Bilirubin, Direct <0.1 0.0 - 0.2 mg/dL  Urine drugs of abuse scrn w alc, routine (Ref Lab)  Result Value Ref Range   Amphetamines, Urine Negative Cutoff=1000 ng/mL   Barbiturate, Ur See Final Results Cutoff=300 ng/mL   Benzodiazepine Quant, Ur Negative Cutoff=300 ng/mL   Cannabinoid Quant, Ur Negative Cutoff=50 ng/mL   Cocaine (Metab.) Negative Cutoff=300 ng/mL   Opiate Quant, Ur Negative Cutoff=300 ng/mL   Phencyclidine, Ur Negative Cutoff=25 ng/mL   Methadone Screen, Urine Negative Cutoff=300 ng/mL   Propoxyphene, Urine Negative Cutoff=300 ng/mL   Ethanol U, Quan Negative Cutoff=0.020 %  Drug Profile [phone]  Result Value Ref Range   BARBITURATES Positive (A) Cutoff=300   Amobarbital, Ur Negative Cutoff=200   Secobarbital, Ur Negative Cutoff=200   Butalbital, Ur Negative Cutoff=200   Pentobarbital, Ur Negative Cutoff=200   Phenobarbital,Ur Positive (A)    Phenobarbital, GC/MS 606 Cutoff=200 ng/mL      Assessment & Plan:   Problem List Items Addressed This Visit       Other   Restless leg syndrome - Primary    Chronic, with no benefit from Gabapentin, even with self increasing at home.  At this time discontinue Gabapentin and start Lyrica 50 MG QHS.  Reviewed with patient and educated her on this medication.  Will plan to adjust as needed next visit.  Continue B12 daily.  Return in 5 weeks.      Foot pain, right    Acute and improved at this time, no further concerns.      Other Visit Diagnoses     Flu vaccine need       Flu vaccine today   Relevant Orders   Flu Vaccine QUAD 6+ mos PF IM (Fluarix Quad PF) (Completed)        Follow up  plan: Return in about 5 weeks (around 11/29/2020) for T2DM, HTN/HLD, RLS, MOOD, B12.

## 2020-10-25 NOTE — Telephone Encounter (Signed)
Requested medication (s) are due for refill today: see notes, signed today 10/25/20  Requested medication (s) are on the active medication list: yes  Last refill:  10/25/20 #90 4 refills  Future visit scheduled: yes in 1 month   Notes to clinic:  Pharmacy comment: Drug interxn with lithium, lisinopril can cause toxic levels of lithium. Please advise how to handle.     Requested Prescriptions  Pending Prescriptions Disp Refills   lisinopril (ZESTRIL) 10 MG tablet [Pharmacy Med Name: LISINOPRIL 10MG   TAB] 90 tablet 4    Sig: TAKE 1 TABLET BY MOUTH ONCE DAILY     Cardiovascular:  ACE Inhibitors Passed - 10/25/2020  3:02 PM      Passed - Cr in normal range and within 180 days    Creat  Date Value Ref Range Status  12/19/2018 0.68 0.50 - 1.10 mg/dL Final   Creatinine, Ser  Date Value Ref Range Status  10/05/2020 0.62 0.44 - 1.00 mg/dL Final          Passed - K in normal range and within 180 days    Potassium  Date Value Ref Range Status  10/05/2020 4.0 3.5 - 5.1 mmol/L Final  06/11/2013 3.5 3.5 - 5.1 mmol/L Final          Passed - Patient is not pregnant      Passed - Last BP in normal range    BP Readings from Last 1 Encounters:  10/25/20 120/70          Passed - Valid encounter within last 6 months    Recent Outpatient Visits           Today Restless leg syndrome   [facility] [place], Barbaraann Faster, NP   1 month ago Foot pain, right   [place], [place] T, NP   2 months ago Type 2 diabetes mellitus with obesity ([place])   [place], Jolene T, NP   7 months ago Right foot pain   [place], [place] T, NP   8 months ago [place], Barbaraann Faster, NP       Future Appointments             In 1 month [place], Barbaraann Faster, NP [facility], [place]   In 2 months  [facility], [place]   In 4 months [place], Barbaraann Faster, NP [facility], [place]

## 2020-10-25 NOTE — Patient Instructions (Signed)

## 2020-10-25 NOTE — Assessment & Plan Note (Signed)
Acute and improved at this time, no further concerns.

## 2020-10-25 NOTE — Assessment & Plan Note (Signed)
Chronic, with no benefit from Gabapentin, even with self increasing at home.  At this time discontinue Gabapentin and start Lyrica 50 MG QHS.  Reviewed with patient and educated her on this medication.  Will plan to adjust as needed next visit.  Continue B12 daily.  Return in 5 weeks.

## 2020-11-04 ENCOUNTER — Encounter: Payer: Self-pay | Admitting: General Surgery

## 2020-11-04 DIAGNOSIS — J4541 Moderate persistent asthma with (acute) exacerbation: Secondary | ICD-10-CM | POA: Diagnosis not present

## 2020-11-04 DIAGNOSIS — J9601 Acute respiratory failure with hypoxia: Secondary | ICD-10-CM | POA: Diagnosis not present

## 2020-11-04 DIAGNOSIS — C3400 Malignant neoplasm of unspecified main bronchus: Secondary | ICD-10-CM | POA: Diagnosis not present

## 2020-11-08 ENCOUNTER — Ambulatory Visit: Payer: Medicare HMO | Admitting: Nurse Practitioner

## 2020-11-21 ENCOUNTER — Other Ambulatory Visit: Payer: Self-pay | Admitting: Psychiatry

## 2020-11-21 DIAGNOSIS — F4310 Post-traumatic stress disorder, unspecified: Secondary | ICD-10-CM

## 2020-11-21 DIAGNOSIS — F3162 Bipolar disorder, current episode mixed, moderate: Secondary | ICD-10-CM

## 2020-11-22 ENCOUNTER — Ambulatory Visit: Payer: Self-pay

## 2020-11-22 NOTE — Telephone Encounter (Signed)
Patient called, left VM to return the call to the office to discuss symptoms with a nurse.   Patient called in to inform [facility] that she is having so many hot flashes that she is feeling sick, weak and nauseous. Want to know if there is something that she can take OTC that will not intefere with her medications Please call ph# [phone]

## 2020-11-23 ENCOUNTER — Telehealth: Payer: Self-pay

## 2020-11-23 NOTE — Telephone Encounter (Signed)
Please have patient schedule appointment.   Copied from [place] [phone]. Topic: General - Other >> Nov 22, 2020  2:17 PM Leward Quan A wrote: Reason for CRM: Patient called in to inform [facility] that she is having so many hot flashes that she is feeling sick, weak and nauseous. Want to know if there is something that she can take OTC that will not intefere with her medications Please call ph# [phone]

## 2020-11-24 ENCOUNTER — Other Ambulatory Visit: Payer: Self-pay | Admitting: Psychiatry

## 2020-11-24 DIAGNOSIS — F3162 Bipolar disorder, current episode mixed, moderate: Secondary | ICD-10-CM

## 2020-11-24 DIAGNOSIS — F4310 Post-traumatic stress disorder, unspecified: Secondary | ICD-10-CM

## 2020-11-24 DIAGNOSIS — F3176 Bipolar disorder, in full remission, most recent episode depressed: Secondary | ICD-10-CM

## 2020-11-29 ENCOUNTER — Ambulatory Visit: Payer: Medicare HMO | Admitting: Nurse Practitioner

## 2020-11-29 DIAGNOSIS — I152 Hypertension secondary to endocrine disorders: Secondary | ICD-10-CM

## 2020-11-29 DIAGNOSIS — E1169 Type 2 diabetes mellitus with other specified complication: Secondary | ICD-10-CM

## 2020-12-04 DIAGNOSIS — J4541 Moderate persistent asthma with (acute) exacerbation: Secondary | ICD-10-CM | POA: Diagnosis not present

## 2020-12-04 DIAGNOSIS — J9601 Acute respiratory failure with hypoxia: Secondary | ICD-10-CM | POA: Diagnosis not present

## 2020-12-04 DIAGNOSIS — C3400 Malignant neoplasm of unspecified main bronchus: Secondary | ICD-10-CM | POA: Diagnosis not present

## 2020-12-08 ENCOUNTER — Other Ambulatory Visit: Payer: Self-pay

## 2020-12-08 ENCOUNTER — Encounter: Payer: Self-pay | Admitting: Psychiatry

## 2020-12-08 ENCOUNTER — Telehealth (INDEPENDENT_AMBULATORY_CARE_PROVIDER_SITE_OTHER): Payer: Medicare HMO | Admitting: Psychiatry

## 2020-12-08 DIAGNOSIS — G2581 Restless legs syndrome: Secondary | ICD-10-CM | POA: Diagnosis not present

## 2020-12-08 DIAGNOSIS — G2111 Neuroleptic induced parkinsonism: Secondary | ICD-10-CM

## 2020-12-08 DIAGNOSIS — Z79899 Other long term (current) drug therapy: Secondary | ICD-10-CM | POA: Diagnosis not present

## 2020-12-08 DIAGNOSIS — F1421 Cocaine dependence, in remission: Secondary | ICD-10-CM | POA: Diagnosis not present

## 2020-12-08 DIAGNOSIS — F4310 Post-traumatic stress disorder, unspecified: Secondary | ICD-10-CM | POA: Diagnosis not present

## 2020-12-08 DIAGNOSIS — F1011 Alcohol abuse, in remission: Secondary | ICD-10-CM

## 2020-12-08 DIAGNOSIS — F1211 Cannabis abuse, in remission: Secondary | ICD-10-CM

## 2020-12-08 DIAGNOSIS — R69 Illness, unspecified: Secondary | ICD-10-CM | POA: Diagnosis not present

## 2020-12-08 DIAGNOSIS — F3162 Bipolar disorder, current episode mixed, moderate: Secondary | ICD-10-CM

## 2020-12-08 MED ORDER — CARBAMAZEPINE 200 MG PO TABS: 200.0000 mg | ORAL_TABLET | ORAL | 1 refills | Status: DC

## 2020-12-08 MED ORDER — OLANZAPINE 20 MG PO TABS: 20.0000 mg | ORAL_TABLET | Freq: Every day | ORAL | 0 refills | Status: DC

## 2020-12-08 MED ORDER — ROPINIROLE HCL 0.25 MG PO TABS: 0.2500 mg | ORAL_TABLET | Freq: Every day | ORAL | 0 refills | Status: DC

## 2020-12-08 MED ORDER — TRAZODONE HCL 100 MG PO TABS: ORAL_TABLET | ORAL | 0 refills | Status: DC

## 2020-12-08 NOTE — Patient Instructions (Signed)
Ropinirole Oral Tablets What is this medication? ROPINIROLE (roe PIN i role) is used to treat symptoms of Parkinson's disease. It is also used to treat Restless Legs Syndrome. This medicine may be used for other purposes; ask your health care provider or pharmacist if you have questions. COMMON BRAND NAME(S): Requip What should I tell my care team before I take this medication? They need to know if you have any of these conditions: heart disease high blood pressure kidney disease liver disease low blood pressure narcolepsy sleep apnea smoke tobacco cigarettes an unusual or allergic reaction to ropinirole, other medicines, foods, dyes, or preservatives pregnant or trying to get pregnant breast-feeding How should I use this medication? Take this medicine by mouth with water. Take it as directed on the prescription label. You can take it with or without food. If it upsets your stomach, take it with food. Keep taking this medicine unless your health care provider tells you to stop. Stopping it too quickly can cause serious side effects. It can also make your condition worse. Talk to your health care provider about the use of this medicine in children. Special care may be needed. Overdosage: If you think you have taken too much of this medicine contact a poison control center or emergency room at once. NOTE: This medicine is only for you. Do not share this medicine with others. What if I miss a dose? If you miss a dose, take it as soon as you can. If it is almost time for your next dose, take only that dose. Do not take double or extra doses. What may interact with this medication? alcohol antihistamines for allergy, cough and cold certain medicines for depression, anxiety, or psychotic disturbances certain medicines for seizures like phenobarbital, primidone certain medicines for sleep ciprofloxacin female hormones, like estrogens and birth control pills fluvoxamine general anesthetics  like halothane, isoflurane, methoxyflurane, propofol medicines for blood pressure medicines that relax muscles for surgery metoclopramide narcotic medicines for pain rifampin tobacco smoking This list may not describe all possible interactions. Give your health care provider a list of all the medicines, herbs, non-prescription drugs, or dietary supplements you use. Also tell them if you smoke, drink alcohol, or use illegal drugs. Some items may interact with your medicine. What should I watch for while using this medication? Visit your health care provider for regular checks on your progress. Tell your health care provider if your symptoms do not start to get better or if they get worse. Do not suddenly stop taking this medicine. You may develop a severe reaction. Your health care provider will tell you how much medicine to take. If your health care provider wants you to stop the medicine, the dose may be slowly lowered over time to avoid any side effects. You may get drowsy or dizzy. Do not drive, use machinery, or do anything that needs mental alertness until you know how this drug affects you. Do not stand or sit up quickly, especially if you are an older patient. This reduces the risk of dizzy or fainting spells. Alcohol may interfere with the effect of this medicine. Avoid alcoholic drinks. When taking this medicine, you may fall asleep without notice. You may be doing activities like driving a car, talking, or eating. You may not feel drowsy before it happens. Contact your health care provider right away if this happens to you. There have been reports of increased sexual urges or other strong urges such as gambling while taking this medicine. If you experience  any of these while taking this medicine, you should report this to your health care provider as soon as possible. Your mouth may get dry. Chewing sugarless gum or sucking hard candy and drinking plenty of water may help. Contact your health  care provider if the problem does not go away or is severe. What side effects may I notice from receiving this medication? Side effects that you should report to your doctor or health care professional as soon as possible: allergic reactions (skin rash, itching or hives; swelling of the face, lips, or tongue) changes in emotions or moods changes in vision confusion depressed mood increased blood pressure falling asleep during normal activities like driving fast, irregular heartbeat hallucinations low blood pressure (dizziness; feeling faint or lightheaded, falls; unusually weak or tired) new or increased gambling urges, sexual urges, uncontrolled spending, binge or compulsive eating, or other urges uncontrollable head, mouth, neck, arm, or leg movements Side effects that usually do not require medical attention (report to your doctor or health care professional if they continue or are bothersome): constipation dizziness drowsiness dry mouth headache nausea This list may not describe all possible side effects. Call your doctor for medical advice about side effects. You may report side effects to FDA at 1-800-FDA-1088. Where should I keep my medication? Keep out of the reach of children and pets. Store at room temperature between 20 and 25 degrees C (68 and 77 degrees F). Protect from light and moisture. Keep the container tightly closed. Get rid of any unused medicine after the expiration date. To get rid of medicines that are no longer needed or have expired: Take the medicine to a medicine take-back program. Check with your pharmacy or law enforcement to find a location. If you cannot return the medicine, check the label or package insert to see if the medicine should be thrown out in the garbage or flushed down the toilet. If you are not sure, ask your health care provider. If it is safe to put it in the trash, take the medicine out of the container. Mix the medicine with cat litter,  dirt, coffee grounds, or other unwanted substance. Seal the mixture in a bag or container. Put it in the trash. NOTE: This sheet is a summary. It may not cover all possible information. If you have questions about this medicine, talk to your doctor, pharmacist, or health care provider.  2022 Elsevier/Gold Standard (2019-09-15 20:54:24)

## 2020-12-08 NOTE — Progress Notes (Signed)
Virtual Visit via Video Note  I connected with Vanessa Baker on 12/08/20 at 10:00 AM EDT by a video enabled telemedicine application and verified that I am speaking with the correct person using two identifiers.  Location Provider Location : [facility] Patient Location : Car  Participants: Patient , Narda Rutherford - mother in law ,Provider   I discussed the limitations of evaluation and management by telemedicine and the availability of in person appointments. The patient expressed understanding and agreed to proceed.   I discussed the assessment and treatment plan with the patient. The patient was provided an opportunity to ask questions and all were answered. The patient agreed with the plan and demonstrated an understanding of the instructions.   The patient was advised to call back or seek an in-person evaluation if the symptoms worsen or if the condition fails to improve as anticipated.   [place] MD OP Progress Note  12/08/2020 10:42 AM Vanessa Baker  MRN:  [id number]  Chief Complaint:  Chief Complaint   Follow-up; Depression    HPI: Vanessa Baker is a 45 year old Caucasian female, has a history of bipolar disorder, PTSD, neuroleptic induced parkinsonism, restless leg syndrome, cocaine use disorder in remission, alcohol and cannabis use disorder in early remission, lives with her husband in [place], was evaluated by telemedicine today.  Patient's last visit was on 09/06/2020, at that visit patient was advised to return in 10 days however patient did not follow-up as recommended and had to be contacted by the clinic staff recently to schedule this appointment.  Patient today reports she had a lot going on and forgot completely that she needed a follow-up appointment.  She reports however she continues to struggle with mood symptoms, her sleep is all over the place.  She uses caffeinated drinks like tea, soft drinks throughout the day.  Patient reports she watches TV until 1  or 2 AM and stays in bed majority of the day.  Does not have a good sleep hygiene.  She feels down, sad, struggles with energy level, concentration and has mood swings.  She struggles with her medical problems like pain which does have an impact on her mood and sleep.  She also reports her husband currently struggles with a lot of health problems and she is the primary caregiver and that does have an impact on her mental health.  She reports her mother-in-law does help and support.  Patient reports she is compliant on her medications.  Patient had labs done including carbamazepine level which was reviewed and discussed with patient.  She currently denies side effects.  Patient reports the last time she used cannabis or alcohol was almost a year ago.  She had urine drug screen done recently which was negative.  Patient does report restlessness of her legs at night which could have an impact on her sleep.  Patient reports the benztropine does not help much with that.  She does take primidone for tremors and does not believe benztropine is effective for the same.  Patient denies any suicidality, homicidality or perceptual disturbances.  Patient denies any other concerns today.  Visit Diagnosis:    ICD-10-CM   1. Bipolar 1 disorder, mixed, moderate (HCC)  F31.62 carbamazepine (TEGRETOL) 200 MG tablet    Carbamazepine level, total    Platelet count    2. PTSD (post-traumatic stress disorder)  F43.10 carbamazepine (TEGRETOL) 200 MG tablet    traZODone (DESYREL) 100 MG tablet    OLANZapine (ZYPREXA) 20 MG tablet  3. Neuroleptic induced parkinsonism (HCC)  G21.11     4. Restless leg syndrome  G25.81 rOPINIRole (REQUIP) 0.25 MG tablet    5. Cocaine use disorder, moderate, in sustained remission (HCC)  F14.21     6. Alcohol use disorder, mild, in early remission  F10.11     7. Cannabis use disorder, mild, in early remission  F12.11     8. High risk medication use  Z79.899 Carbamazepine level,  total    Platelet count      Past Psychiatric History: Reviewed past psychiatric history from progress note on 04/02/2017.  Past trials of Seroquel, Latuda, Ambien, Klonopin, Topamax, gabapentin, Trintellix, Abilify, Rexulti, Artane  Past Medical History:  Past Medical History:  Diagnosis Date   Allergy    Anxiety    Asthma    Bipolar disorder ([place])    Bursitis    Depression    Diabetes mellitus without complication ([place])    Essential hypertension 03/28/2017   Frequent headaches    GERD (gastroesophageal reflux disease)    Headache    High cholesterol    Hypertension    Irritable bowel syndrome (IBS)    Irritable colon 02/23/2010   Migraine 04/02/2017   Obesity (BMI 30-39.9) 11/25/2013   Seizure ([place]) [id number]   Seizures ([place])    Tremor     Past Surgical History:  Procedure Laterality Date   CHOLECYSTECTOMY N/A 04/27/2019   Procedure: LAPAROSCOPIC CHOLECYSTECTOMY;  Surgeon: Fredirick Maudlin, MD;  Location: [facility] ORS;  Service: General;  Laterality: N/A;    Family Psychiatric History: Reviewed family psychiatric history from progress note on 04/02/2017  Family History:  Family History  Problem Relation Age of Onset   Bipolar disorder Mother    Schizophrenia Mother    Hypertension Mother    Diabetes Mother    Cancer Mother    Anxiety disorder Mother    ADD / ADHD Mother    Alcohol abuse Mother    Drug abuse Mother    Heart disease Mother    Bipolar disorder Sister    Anxiety disorder Sister    Drug abuse Sister    Bipolar disorder Brother    Anxiety disorder Brother    Drug abuse Brother    Hypertension Father    Cancer Father    Diabetes Father    Breast cancer Maternal Aunt    Leukemia Maternal Aunt    Breast cancer Paternal Aunt    Breast cancer Maternal Grandmother    Alcohol abuse Maternal Grandmother    Bipolar disorder Maternal Grandmother    Breast cancer Paternal Grandmother    Parkinson's disease Paternal Grandmother    Alcohol abuse Maternal  Grandfather    Prostate cancer Maternal Grandfather    Alcohol abuse Maternal Uncle    Multiple myeloma Maternal Uncle    ADD / ADHD Son    Throat cancer Maternal Aunt    Tremor Neg Hx     Social History: Reviewed social history from progress note on 04/02/2017 Social History   Socioeconomic History   Marital status: Married    Spouse name: dewey    Number of children: 1   Years of education: 14   Highest education level: Associate degree: occupational, Hotel manager, or vocational program  Occupational History   Occupation: disability  Tobacco Use   Smoking status: Former    Packs/day: 0.50    Years: 27.00    Pack years: 13.50    Types: Cigarettes    Quit date: 04/26/2019  Years since quitting: 1.6   Smokeless tobacco: Never  Vaping Use   Vaping Use: Former  Substance and Sexual Activity   Alcohol use: Yes   Drug use: No   Sexual activity: Yes    Partners: Male    Birth control/protection: None  Other Topics Concern   Not on file  Social History Narrative   Lives with husband, [name] (on [place])   Right-handed   Caffeine use: 1 cup coffee a day, 2 soft drinks per day   Social Determinants of Health   Financial Resource Strain: Medium Risk   Difficulty of Paying Living Expenses: Somewhat hard  Food Insecurity: No Food Insecurity   Worried About Charity fundraiser in the Last Year: Never true   Ran Out of Food in the Last Year: Never true  Transportation Needs: No Transportation Needs   Lack of Transportation (Medical): No   Lack of Transportation (Non-Medical): No  Physical Activity: Inactive   Days of Exercise per Week: 0 days   Minutes of Exercise per Session: 0 min  Stress: Stress Concern Present   Feeling of Stress : Very much  Social Connections: Not on file    Allergies:  Allergies  Allergen Reactions   Amitriptyline Other (See Comments)    Confusion, paralysis   Wellbutrin [Bupropion]     falls    Metabolic Disorder Labs: Lab Results   Component Value Date   HGBA1C 6.1 (H) 08/19/2020   MPG 111.15 07/08/2019   MPG 128.37 04/27/2019   Lab Results  Component Value Date   PROLACTIN 8.9 07/08/2019   PROLACTIN 4.6 (L) 04/22/2017   Lab Results  Component Value Date   CHOL 123 08/19/2020   TRIG 218 (H) 08/19/2020   HDL 36 (L) 08/19/2020   CHOLHDL 3.2 07/08/2019   VLDL 63 (H) 07/08/2019   LDLCALC 52 08/19/2020   LDLCALC 95 12/15/2019   Lab Results  Component Value Date   TSH 2.240 08/19/2020   TSH 2.880 01/13/2020    Therapeutic Level Labs: Lab Results  Component Value Date   LITHIUM 0.7 08/19/2020   LITHIUM 0.35 (L) 03/21/2020   Lab Results  Component Value Date   VALPROATE 103 (H) 04/26/2019   VALPROATE 61.4 08/13/2017   No components found for:  CBMZ  Current Medications: Current Outpatient Medications  Medication Sig Dispense Refill   ACCU-CHEK AVIVA PLUS test strip USE 1 STRIP TO CHECK GLUCOSE UP TO 3 TIMES DAILY AS DIRECTED. 300 each 1   Accu-Chek Softclix Lancets lancets Use to check blood sugar up to 3 times daily as instructed 300 each 1   albuterol (VENTOLIN HFA) 108 (90 Base) MCG/ACT inhaler INHALE 2 PUFFS BY MOUTH EVERY 6 HOURS AS NEEDED FOR WHEEZING AND FOR SHORTNESS OF BREATH 18 g 4   atorvastatin (LIPITOR) 40 MG tablet Take 1 tablet (40 mg total) by mouth daily. 90 tablet 4   baclofen (LIORESAL) 10 MG tablet Take 1 tablet by mouth three times daily as needed for muscle spasm 90 tablet 3   carbamazepine (TEGRETOL) 200 MG tablet Take 1-1.5 tablets (200-300 mg total) by mouth as directed. Start taking 1 tablet daily AM and 1.5 tablets daily PM 75 tablet 1   cetirizine (ZYRTEC) 10 MG tablet Take 1 tablet (10 mg total) by mouth daily as needed for allergies (itching). 30 tablet 0   Dulaglutide (TRULICITY) 1.5 YB/6.3SL SOPN INJECT 1.5MG SUBCUTANEOUSLY ONCE WEEKLY 8 mL 4   fluticasone-salmeterol (ADVAIR HFA) 115-21 MCG/ACT inhaler Inhale  2 puffs into the lungs 2 (two) times daily. 1 each 12    lisinopril (ZESTRIL) 10 MG tablet TAKE 1 TABLET BY MOUTH ONCE DAILY 90 tablet 4   metFORMIN (GLUCOPHAGE) 500 MG tablet Take 1 tablet (500 mg total) by mouth 2 (two) times daily. 180 tablet 4   Methylcobalamin (B-12) 1000 MCG TBDP Take 1,000 mcg by mouth daily. 90 tablet 4   omega-3 acid ethyl esters (LOVAZA) 1 g capsule Take 1 capsule (1 g total) by mouth 2 (two) times daily. 180 capsule 4   omeprazole (PRILOSEC) 20 MG capsule Take 1 capsule (20 mg total) by mouth daily. 90 capsule 4   pregabalin (LYRICA) 50 MG capsule Take 1 capsule (50 mg total) by mouth at bedtime. 30 capsule 1   primidone (MYSOLINE) 50 MG tablet Take 1 tablet (50 mg total) by mouth at bedtime. 90 tablet 4   rOPINIRole (REQUIP) 0.25 MG tablet Take 1 tablet (0.25 mg total) by mouth at bedtime. 30 tablet 0   sertraline (ZOLOFT) 50 MG tablet Take 1 tablet by mouth once daily 30 tablet 0   tiZANidine (ZANAFLEX) 4 MG tablet      BINAXNOW COVID-19 AG HOME TEST KIT Use as Directed on the Package (Patient not taking: Reported on 12/08/2020)     blood glucose meter kit and supplies Dispense based on patient and insurance preference. Use up to four times daily as directed. (FOR ICD-10 E10.9, E11.9). (Patient not taking: Reported on 12/08/2020) 1 each 0   Blood Glucose Monitoring Suppl (ACCU-CHEK AVIVA PLUS) w/Device KIT Use to check blood sugar up to 3 times daily (Patient not taking: Reported on 12/08/2020) 1 kit 0   Calcium Carbonate+Vitamin D (CALCIUM 600+D3) 600-200 MG-UNIT TABS Take 1 tablet by mouth daily. (Patient not taking: Reported on 12/08/2020) 90 tablet 4   gabapentin (NEURONTIN) 300 MG capsule Take 300 mg by mouth 2 (two) times daily. (Patient not taking: Reported on 12/08/2020)     norethindrone-ethinyl estradiol-FE (JUNEL FE 1/20) 1-20 MG-MCG tablet Take 1 tablet by mouth daily. (Patient not taking: Reported on 12/08/2020) 84 tablet 4   OLANZapine (ZYPREXA) 20 MG tablet Take 1 tablet (20 mg total) by mouth at bedtime. 30 tablet 0    traZODone (DESYREL) 100 MG tablet TAKE 1 & 1/2 (ONE & ONE-HALF) TABLETS BY MOUTH AT BEDTIME FOR SLEEP 45 tablet 0   triamcinolone (KENALOG) 0.1 % Apply 1 application topically 2 (two) times daily. (Patient not taking: Reported on 12/08/2020) 30 g 2   No current facility-administered medications for this visit.     Musculoskeletal: Strength & Muscle Tone:  UTA Gait & Station:  Seated Patient leans: N/A  Psychiatric Specialty Exam: Review of Systems  Musculoskeletal:  Positive for arthralgias and back pain.  Neurological:  Positive for tremors.  Psychiatric/Behavioral:  Positive for decreased concentration, dysphoric mood and sleep disturbance. The patient is nervous/anxious.   All other systems reviewed and are negative.  Last menstrual period 11/26/2018.There is no height or weight on file to calculate BMI.  General Appearance: Casual  Eye Contact:  Fair  Speech:  Clear and Coherent  Volume:  Normal  Mood:  Anxious and Depressed  Affect:  Congruent  Thought Process:  Goal Directed and Descriptions of Associations: Intact  Orientation:  Full (Time, Place, and Person)  Thought Content: Logical   Suicidal Thoughts:  No  Homicidal Thoughts:  No  Memory:  Immediate;   Fair Recent;   Fair Remote;   Fair  Judgement:  Fair  Insight:  Fair  Psychomotor Activity:  Normal  Concentration:  Concentration: Fair and Attention Span: Fair  Recall:  [employer] of Knowledge: Fair  Language: Fair  Akathisia:  No  Handed:  Right  AIMS (if indicated): not done- UTA  Assets:  Communication Skills Desire for Improvement Resilience Transportation  ADL's:  Intact  Cognition: WNL  Sleep:  Poor   Screenings: [place] Office Visit from 09/06/2020 in [place] Visit from 04/07/2018 in [place] Office Visit from 01/01/2018 in [place] Total Score 0 10 1      [place] Office Visit from 09/06/2020 in [place] Visit from 08/25/2019 in [place] Visit from 12/16/2018 in [facility] Office Visit from 09/06/2017 in [facility]  Total GAD-7 Score _0 LNL8-9    [place] Office Visit from 09/06/2020 in [place] Office Visit from 08/23/2020 in [facility] Video Visit from 03/22/2020 in [place] Office Visit from 02/25/2020 in [place] from 01/08/2020 in [place]  PHQ-2 Total Score _1 0 6  PHQ-9 Total Score 9 14 -- 11 14      [place] Visit from 09/06/2020 in [place] Video Visit from 03/22/2020 in [place] No Risk No Risk        Assessment and Plan: Lorelei Heikkila is a 45 year old Caucasian female who has a history of bipolar disorder, panic disorder, PTSD, drug induced Parkinson's disease, diabetes mellitus, polysubstance abuse was evaluated by telemedicine today.  Patient currently with mood swings, depression symptoms, sleep problems, likely multifactorial including situational stressors, pain, caffeine use and not having a good sleep hygiene.  Discussed plan as noted below.  Plan Bipolar disorder type I mixed moderate-unstable Increase carbamazepine to 200 mg daily in the morning and 300 mg every afternoon Zyprexa 20 mg p.o. nightly We will consider reducing the dosage of Zyprexa once she is stable on carbamazepine. Continue Zoloft 50 mg p.o. daily  Panic disorder-stable Zoloft 50 mg p.o. daily  Insomnia-unstable Continue trazodone 100-150 mg p.o. nightly Patient was referred for sleep study-noncompliant Discussed sleep hygiene techniques including limiting the amount of caffeine, not using alcohol at bedtime,  switching of TV devices couple of hours prior to bedtime, having set bedtime routine.  Also discussed pain management.  RLS-unstable Start Requip 0.25 mg p.o. nightly  Neuroleptic induced Parkinson's disease-improving Discontinue Cogentin for lack of benefit Patient was referred to neurologist in the past. She is currently on primidone.  She could continue the same for tremors.  Unknown if tremors are secondary to her neuroleptics or not.  Alcohol use disorder/cocaine and cannabis use disorder in remission Patient had a urine drug screen done-10/05/2020-negative.  We will continue to monitor closely.  High risk medication use-reviewed and discussed carbamazepine level-10/05/2020-subtherapeutic, CMP-glucose elevated at 126, otherwise within normal limits.  Patient to follow up with primary care provider for abnormal labs. Will order repeat carbamazepine level since her dosage is being increased, also will order platelet count.  Noncompliance with treatment plan-provided education.  Encouraged compliance.  Follow-up in clinic in person in 4 weeks or sooner if needed.  Encouraged compliance.  Discussed clinic policy with patient including termination.  This  note was generated in part or whole with voice recognition software. Voice recognition is usually quite accurate but there are transcription errors that can and very often do occur. I apologize for any typographical errors that were not detected and corrected.         Ursula Alert, MD 12/09/2020, 8:25 AM

## 2020-12-19 ENCOUNTER — Telehealth: Payer: Self-pay

## 2020-12-19 NOTE — Chronic Care Management (AMB) (Signed)
    Chronic Care Management Pharmacy Assistant   Name: Jhordan Kinter  MRN: [id number] DOB: 1975/04/24  Reason for Encounter: Jamse Arn adherence    I called [facility] pharmacy to confirm medication fill date.  Pharmacy confirmed Trulicity 9.3/8.1 last filled on 12/09/20 for 84 DS.   Andee Poles, CMA

## 2021-01-03 ENCOUNTER — Ambulatory Visit: Payer: Medicare HMO | Admitting: Psychiatry

## 2021-01-04 DIAGNOSIS — C3400 Malignant neoplasm of unspecified main bronchus: Secondary | ICD-10-CM | POA: Diagnosis not present

## 2021-01-04 DIAGNOSIS — J4541 Moderate persistent asthma with (acute) exacerbation: Secondary | ICD-10-CM | POA: Diagnosis not present

## 2021-01-04 DIAGNOSIS — J9601 Acute respiratory failure with hypoxia: Secondary | ICD-10-CM | POA: Diagnosis not present

## 2021-01-09 ENCOUNTER — Telehealth: Payer: Self-pay

## 2021-01-09 ENCOUNTER — Ambulatory Visit: Payer: Medicare HMO

## 2021-01-09 NOTE — Telephone Encounter (Signed)
Spoke with patient and she states she missed her Wellness Visit she was scheduled for today due to going to get a vaccine and she missed the call. Patient is asking if she can have someone reach out to her and have her appointment rescheduled. Please advise?

## 2021-01-16 ENCOUNTER — Ambulatory Visit (INDEPENDENT_AMBULATORY_CARE_PROVIDER_SITE_OTHER): Payer: Medicare HMO | Admitting: *Deleted

## 2021-01-16 DIAGNOSIS — Z0000 Encounter for general adult medical examination without abnormal findings: Secondary | ICD-10-CM | POA: Diagnosis not present

## 2021-01-16 NOTE — Patient Instructions (Signed)
Vanessa Baker , Thank you for taking time to come for your Medicare Wellness Visit. I appreciate your ongoing commitment to your health goals. Please review the following plan we discussed and let me know if I can assist you in the future.   Screening recommendations/referrals:  Mammogram: up to date  Recommended yearly ophthalmology/optometry visit for glaucoma screening and checkup Recommended yearly dental visit for hygiene and checkup  Vaccinations: Influenza vaccine: up to date Pneumococcal vaccine: up to date Tdap vaccine: up to date     Advanced directives: Education provided  Conditions/risks identified:   Next appointment: 02-25-2020 A 1:40  [place] 40-64  and Older, Female Preventive care refers to lifestyle choices and visits with your health care provider that can promote health and wellness. What does preventive care include? A yearly physical exam. This is also called an annual well check. Dental exams once or twice a year. Routine eye exams. Ask your health care provider how often you should have your eyes checked. Personal lifestyle choices, including: Daily care of your teeth and gums. Regular physical activity. Eating a healthy diet. Avoiding tobacco and drug use. Limiting alcohol use. Practicing safe sex. Taking low-dose aspirin every day. Taking vitamin and mineral supplements as recommended by your health care provider. What happens during an annual well check? The services and screenings done by your health care provider during your annual well check will depend on your age, overall health, lifestyle risk factors, and family history of disease. Counseling  Your health care provider may ask you questions about your: Alcohol use. Tobacco use. Drug use. Emotional well-being. Home and relationship well-being. Sexual activity. Eating habits. History of falls. Memory and ability to understand (cognition). Work and work  Statistician. Reproductive health. Screening  You may have the following tests or measurements: Height, weight, and BMI. Blood pressure. Lipid and cholesterol levels. These may be checked every 5 years, or more frequently if you are over 17 years old. Skin check. Lung cancer screening. You may have this screening every year starting at age 56 if you have a 30-pack-year history of smoking and currently smoke or have quit within the past 15 years. Fecal occult blood test (FOBT) of the stool. You may have this test every year starting at age 42. Flexible sigmoidoscopy or colonoscopy. You may have a sigmoidoscopy every 5 years or a colonoscopy every 10 years starting at age 17. Hepatitis C blood test. Hepatitis B blood test. Sexually transmitted disease (STD) testing. Diabetes screening. This is done by checking your blood sugar (glucose) after you have not eaten for a while (fasting). You may have this done every 1-3 years. Bone density scan. This is done to screen for osteoporosis. You may have this done starting at age 73. Mammogram. This may be done every 1-2 years. Talk to your health care provider about how often you should have regular mammograms. Talk with your health care provider about your test results, treatment options, and if necessary, the need for more tests. Vaccines  Your health care provider may recommend certain vaccines, such as: Influenza vaccine. This is recommended every year. Tetanus, diphtheria, and acellular pertussis (Tdap, Td) vaccine. You may need a Td booster every 10 years. Zoster vaccine. You may need this after age 73. Pneumococcal 13-valent conjugate (PCV13) vaccine. One dose is recommended after age 75. Pneumococcal polysaccharide (PPSV23) vaccine. One dose is recommended after age 58. Talk to your health care provider about which screenings and vaccines you need and how  often you need them. This information is not intended to replace advice given to you by  your health care provider. Make sure you discuss any questions you have with your health care provider. Document Released: 02/18/2015 Document Revised: 10/12/2015 Document Reviewed: 11/23/2014 Elsevier Interactive Patient Education  2017 [employer] Prevention in the Home Falls can cause injuries. They can happen to people of all ages. There are many things you can do to make your home safe and to help prevent falls. What can I do on the outside of my home? Regularly fix the edges of walkways and driveways and fix any cracks. Remove anything that might make you trip as you walk through a door, such as a raised step or threshold. Trim any bushes or trees on the path to your home. Use bright outdoor lighting. Clear any walking paths of anything that might make someone trip, such as rocks or tools. Regularly check to see if handrails are loose or broken. Make sure that both sides of any steps have handrails. Any raised decks and porches should have guardrails on the edges. Have any leaves, snow, or ice cleared regularly. Use sand or salt on walking paths during winter. Clean up any spills in your garage right away. This includes oil or grease spills. What can I do in the bathroom? Use night lights. Install grab bars by the toilet and in the tub and shower. Do not use towel bars as grab bars. Use non-skid mats or decals in the tub or shower. If you need to sit down in the shower, use a plastic, non-slip stool. Keep the floor dry. Clean up any water that spills on the floor as soon as it happens. Remove soap buildup in the tub or shower regularly. Attach bath mats securely with double-sided non-slip rug tape. Do not have throw rugs and other things on the floor that can make you trip. What can I do in the bedroom? Use night lights. Make sure that you have a light by your bed that is easy to reach. Do not use any sheets or blankets that are too big for your bed. They should not hang  down onto the floor. Have a firm chair that has side arms. You can use this for support while you get dressed. Do not have throw rugs and other things on the floor that can make you trip. What can I do in the kitchen? Clean up any spills right away. Avoid walking on wet floors. Keep items that you use a lot in easy-to-reach places. If you need to reach something above you, use a strong step stool that has a grab bar. Keep electrical cords out of the way. Do not use floor polish or wax that makes floors slippery. If you must use wax, use non-skid floor wax. Do not have throw rugs and other things on the floor that can make you trip. What can I do with my stairs? Do not leave any items on the stairs. Make sure that there are handrails on both sides of the stairs and use them. Fix handrails that are broken or loose. Make sure that handrails are as long as the stairways. Check any carpeting to make sure that it is firmly attached to the stairs. Fix any carpet that is loose or worn. Avoid having throw rugs at the top or bottom of the stairs. If you do have throw rugs, attach them to the floor with carpet tape. Make sure that you have a  light switch at the top of the stairs and the bottom of the stairs. If you do not have them, ask someone to add them for you. What else can I do to help prevent falls? Wear shoes that: Do not have high heels. Have rubber bottoms. Are comfortable and fit you well. Are closed at the toe. Do not wear sandals. If you use a stepladder: Make sure that it is fully opened. Do not climb a closed stepladder. Make sure that both sides of the stepladder are locked into place. Ask someone to hold it for you, if possible. Clearly mark and make sure that you can see: Any grab bars or handrails. First and last steps. Where the edge of each step is. Use tools that help you move around (mobility aids) if they are needed. These  include: Canes. Walkers. Scooters. Crutches. Turn on the lights when you go into a dark area. Replace any light bulbs as soon as they burn out. Set up your furniture so you have a clear path. Avoid moving your furniture around. If any of your floors are uneven, fix them. If there are any pets around you, be aware of where they are. Review your medicines with your doctor. Some medicines can make you feel dizzy. This can increase your chance of falling. Ask your doctor what other things that you can do to help prevent falls. This information is not intended to replace advice given to you by your health care provider. Make sure you discuss any questions you have with your health care provider. Document Released: 11/18/2008 Document Revised: 06/30/2015 Document Reviewed: 02/26/2014 Elsevier Interactive Patient Education  2017 [employer].

## 2021-01-16 NOTE — Progress Notes (Signed)
Subjective:   Vanessa Baker is a 45 y.o. female who presents for Medicare Annual (Subsequent) preventive examination.  I connected with  [name] on 01/16/21 by a telephone enabled telemedicine application and verified that I am speaking with the correct person using two identifiers.   I discussed the limitations of evaluation and management by telemedicine. The patient expressed understanding and agreed to proceed.  Patient location: home  Provider location:  Tele-Health  not in office     Review of Systems     Cardiac Risk Factors include: advanced age (>46mn, >>22women);diabetes mellitus;hypertension;sedentary lifestyle     Objective:    Today's Vitals   There is no height or weight on file to calculate BMI.  Advanced Directives 01/16/2021 01/16/2021 01/14/2020 01/08/2020 04/27/2019 04/26/2019 04/26/2019  Does Patient Have a Medical Advance Directive? - _0  No  Would patient like information on creating a medical advance directive? No - Patient declined No - Patient declined No - Patient declined - No - Patient declined No - Patient declined No - Patient declined  Some encounter information is confidential and restricted. Go to Review Flowsheets activity to see all data.    Current Medications (verified) Outpatient Encounter Medications as of 01/16/2021  Medication Sig   ACCU-CHEK AVIVA PLUS test strip USE 1 STRIP TO CHECK GLUCOSE UP TO 3 TIMES DAILY AS DIRECTED.   Accu-Chek Softclix Lancets lancets Use to check blood sugar up to 3 times daily as instructed   albuterol (VENTOLIN HFA) 108 (90 Base) MCG/ACT inhaler INHALE 2 PUFFS BY MOUTH EVERY 6 HOURS AS NEEDED FOR WHEEZING AND FOR SHORTNESS OF BREATH   atorvastatin (LIPITOR) 40 MG tablet Take 1 tablet (40 mg total) by mouth daily.   baclofen (LIORESAL) 10 MG tablet Take 1 tablet by mouth three times daily as needed for muscle spasm   blood glucose meter kit and supplies Dispense based on  patient and insurance preference. Use up to four times daily as directed. (FOR ICD-10 E10.9, E11.9).   Blood Glucose Monitoring Suppl (ACCU-CHEK AVIVA PLUS) w/Device KIT Use to check blood sugar up to 3 times daily   Calcium Carbonate+Vitamin D (CALCIUM 600+D3) 600-200 MG-UNIT TABS Take 1 tablet by mouth daily.   carbamazepine (TEGRETOL) 200 MG tablet Take 1-1.5 tablets (200-300 mg total) by mouth as directed. Start taking 1 tablet daily AM and 1.5 tablets daily PM   Dulaglutide (TRULICITY) 1.5 MBL/3.9QZSOPN INJECT 1.5MG SUBCUTANEOUSLY ONCE WEEKLY   fluticasone-salmeterol (ADVAIR HFA) 115-21 MCG/ACT inhaler Inhale 2 puffs into the lungs 2 (two) times daily.   gabapentin (NEURONTIN) 300 MG capsule Take 300 mg by mouth 2 (two) times daily.   lisinopril (ZESTRIL) 10 MG tablet TAKE 1 TABLET BY MOUTH ONCE DAILY   metFORMIN (GLUCOPHAGE) 500 MG tablet Take 1 tablet (500 mg total) by mouth 2 (two) times daily.   Methylcobalamin (B-12) 1000 MCG TBDP Take 1,000 mcg by mouth daily.   OLANZapine (ZYPREXA) 20 MG tablet Take 1 tablet (20 mg total) by mouth at bedtime.   omega-3 acid ethyl esters (LOVAZA) 1 g capsule Take 1 capsule (1 g total) by mouth 2 (two) times daily.   omeprazole (PRILOSEC) 20 MG capsule Take 1 capsule (20 mg total) by mouth daily.   primidone (MYSOLINE) 50 MG tablet Take 1 tablet (50 mg total) by mouth at bedtime.   rOPINIRole (REQUIP) 0.25 MG tablet Take 1 tablet (0.25 mg total) by mouth at bedtime.   sertraline (ZOLOFT) 50  MG tablet Take 1 tablet by mouth once daily   tiZANidine (ZANAFLEX) 4 MG tablet    traZODone (DESYREL) 100 MG tablet TAKE 1 & 1/2 (ONE & ONE-HALF) TABLETS BY MOUTH AT BEDTIME FOR SLEEP   BINAXNOW COVID-19 AG HOME TEST KIT Use as Directed on the Package (Patient not taking: Reported on 01/16/2021)   cetirizine (ZYRTEC) 10 MG tablet Take 1 tablet (10 mg total) by mouth daily as needed for allergies (itching). (Patient not taking: Reported on 01/16/2021)    norethindrone-ethinyl estradiol-FE (JUNEL FE 1/20) 1-20 MG-MCG tablet Take 1 tablet by mouth daily. (Patient not taking: Reported on 01/16/2021)   pregabalin (LYRICA) 50 MG capsule Take 1 capsule (50 mg total) by mouth at bedtime. (Patient not taking: Reported on 01/16/2021)   triamcinolone (KENALOG) 0.1 % Apply 1 application topically 2 (two) times daily. (Patient not taking: Reported on 12/08/2020)   No facility-administered encounter medications on file as of 01/16/2021.    Allergies (verified) Amitriptyline and Wellbutrin [bupropion]   History: Past Medical History:  Diagnosis Date   Allergy    Anxiety    Asthma    Bipolar disorder ([place])    Bursitis    Depression    Diabetes mellitus without complication ([place])    Essential hypertension 03/28/2017   Frequent headaches    GERD (gastroesophageal reflux disease)    Headache    High cholesterol    Hypertension    Irritable bowel syndrome (IBS)    Irritable colon 02/23/2010   Migraine 04/02/2017   Obesity (BMI 30-39.9) 11/25/2013   Seizure ([place]) [id number]   Seizures ([place])    Tremor    Past Surgical History:  Procedure Laterality Date   CHOLECYSTECTOMY N/A 04/27/2019   Procedure: LAPAROSCOPIC CHOLECYSTECTOMY;  Surgeon: Fredirick Maudlin, MD;  Location: [facility] ORS;  Service: General;  Laterality: N/A;   Family History  Problem Relation Age of Onset   Bipolar disorder Mother    Schizophrenia Mother    Hypertension Mother    Diabetes Mother    Cancer Mother    Anxiety disorder Mother    ADD / ADHD Mother    Alcohol abuse Mother    Drug abuse Mother    Heart disease Mother    Bipolar disorder Sister    Anxiety disorder Sister    Drug abuse Sister    Bipolar disorder Brother    Anxiety disorder Brother    Drug abuse Brother    Hypertension Father    Cancer Father    Diabetes Father    Breast cancer Maternal Aunt    Leukemia Maternal Aunt    Breast cancer Paternal Aunt    Breast cancer Maternal Grandmother    Alcohol  abuse Maternal Grandmother    Bipolar disorder Maternal Grandmother    Breast cancer Paternal Grandmother    Parkinson's disease Paternal Grandmother    Alcohol abuse Maternal Grandfather    Prostate cancer Maternal Grandfather    Alcohol abuse Maternal Uncle    Multiple myeloma Maternal Uncle    ADD / ADHD Son    Throat cancer Maternal Aunt    Tremor Neg Hx    Social History   Socioeconomic History   Marital status: Married    Spouse name: dewey    Number of children: 1   Years of education: 14   Highest education level: Associate degree: occupational, Hotel manager, or vocational program  Occupational History   Occupation: disability  Tobacco Use   Smoking status: Former    Packs/day: 0.50  Years: 27.00    Pack years: 13.50    Types: Cigarettes    Quit date: 04/26/2019    Years since quitting: 1.7   Smokeless tobacco: Never  Vaping Use   Vaping Use: Former  Substance and Sexual Activity   Alcohol use: Yes   Drug use: No   Sexual activity: Yes    Partners: Male    Birth control/protection: None  Other Topics Concern   Not on file  Social History Narrative   Lives with husband, [name] (on [place])   Right-handed   Caffeine use: 1 cup coffee a day, 2 soft drinks per day   Social Determinants of Health   Financial Resource Strain: Medium Risk   Difficulty of Paying Living Expenses: Somewhat hard  Food Insecurity: No Food Insecurity   Worried About Charity fundraiser in the Last Year: Never true   Ran Out of Food in the Last Year: Never true  Transportation Needs: No Transportation Needs   Lack of Transportation (Medical): No   Lack of Transportation (Non-Medical): No  Physical Activity: Insufficiently Active   Days of Exercise per Week: 2 days   Minutes of Exercise per Session: 20 min  Stress: Stress Concern Present   Feeling of Stress : Rather much  Social Connections: Moderately Isolated   Frequency of Communication with Friends and Family: Twice a  week   Frequency of Social Gatherings with Friends and Family: Twice a week   Attends Religious Services: Never   Marine scientist or Organizations: No   Attends Music therapist: Never   Marital Status: Married    Tobacco Counseling Counseling given: Not Answered   Clinical Intake:  Pre-visit preparation completed: Yes  Pain : No/denies pain     Nutritional Risks: None Diabetes: Yes CBG done?: No Did pt. bring in CBG monitor from home?: No  How often do you need to have someone help you when you read instructions, pamphlets, or other written materials from your doctor or pharmacy?: 1 - Never  Diabetic?  Yes  Nutrition Risk Assessment:  Has the patient had any N/V/D within the last 2 months?  No  Does the patient have any non-healing wounds?  No  Has the patient had any unintentional weight loss or weight gain?  No   Diabetes:  Is the patient diabetic?  Yes  If diabetic, was a CBG obtained today?  No  Did the patient bring in their glucometer from home?  No  How often do you monitor your CBG's? 3  daily .   Financial Strains and Diabetes Management:  Are you having any financial strains with the device, your supplies or your medication? No .  Does the patient want to be seen by Chronic Care Management for management of their diabetes?  No  Would the patient like to be referred to a Nutritionist or for Diabetic Management?  No   Diabetic Exams:  Diabetic Eye Exam: . Overdue for diabetic eye exam. Pt has been advised about the importance in completing this exam.   Diabetic Foot Exam:  Pt has been advised about the importance in completing this exam.  Interpreter Needed?: No  Information entered by :: Leroy Kennedy LPN   Activities of Daily Living In your present state of health, do you have any difficulty performing the following activities: 01/16/2021  Hearing? N  Vision? N  Difficulty concentrating or making decisions? N  Walking or  climbing stairs? N  Doing errands,  shopping? N  Preparing Food and eating ? N  Using the Toilet? N  In the past six months, have you accidently leaked urine? Y  Do you have problems with loss of bowel control? N  Managing your Medications? N  Managing your Finances? N  Housekeeping or managing your Housekeeping? N  Some recent data might be hidden    Patient Care Team: Venita Lick, NP as PCP - General (Nurse Practitioner) Anabel Bene, MD as Consulting Physician (Neurology) Ursula Alert, MD as Consulting Physician (Psychiatry)  Indicate any recent Medical Services you may have received from other than [facility] providers in the past year (date may be approximate).     Assessment:   This is a routine wellness examination for Jaidon.  Hearing/Vision screen Hearing Screening - Comments:: No trouble hearing Vision Screening - Comments:: Not up to date  Dietary issues and exercise activities discussed: Current Exercise Habits: Home exercise routine, Type of exercise: walking, Time (Minutes): 20, Frequency (Times/Week): 2, Weekly Exercise (Minutes/Week): 40, Intensity: Mild   Goals Addressed             This Visit's Progress    Patient Stated       No goals        Depression Screen PHQ 2/9 Scores 01/16/2021 08/23/2020 02/25/2020 01/08/2020 12/15/2019 08/25/2019 01/09/2019  PHQ - 2 Score 5 4 0 _0 0  PHQ- 9 Score _1 0  Exception Documentation - - - - - - -  Some encounter information is confidential and restricted. Go to Review Flowsheets activity to see all data.    Fall Risk Fall Risk  01/16/2021 02/25/2020 01/13/2020 01/08/2020 08/25/2019  Falls in the past year? 0 _2 0  Comment - - - getting up to go to bathroom and legs gave out -  Number falls in past yr: 0 1 1 0 0  Injury with Fall? 0 0 0 0 0  Risk for fall due to : - - - Medication side effect -  Follow up Falls evaluation completed;Falls prevention discussed - Falls evaluation completed  Falls evaluation completed;Education provided;Falls prevention discussed Falls evaluation completed    FALL RISK PREVENTION PERTAINING TO THE HOME:  Any stairs in or around the home? No  If so, are there any without handrails? No  Home free of loose throw rugs in walkways, pet beds, electrical cords, etc? Yes  Adequate lighting in your home to reduce risk of falls? Yes   ASSISTIVE DEVICES UTILIZED TO PREVENT FALLS:  Life alert? No  Use of a cane, walker or w/c? No  Grab bars in the bathroom? No  Shower chair or bench in shower? No  Elevated toilet seat or a handicapped toilet? No   TIMED UP AND GO:  Was the test performed? No .   Cognitive Function:  Normal cognitive status assessed by direct observation by this Nurse Health Advisor. No abnormalities found.       6CIT Screen 02/01/2020 01/13/2020 01/08/2020 11/19/2017  What Year? 0 points 0 points 0 points 0 points  What month? 0 points 0 points 0 points 0 points  What time? 0 points 0 points 3 points 0 points  Count back from 20 2 points 4 points 2 points 0 points  Months in reverse 2 points 4 points 4 points 0 points  Repeat phrase 8 points 10 points 8 points 0 points  Total Score _3 0    Immunizations Immunization  History  Administered Date(s) Administered   Influenza Inj Mdck Quad Pf 12/14/2015   Influenza,inj,Quad PF,6+ Mos 02/28/2017, 11/19/2017, 11/07/2018, 10/25/2020   Influenza-Unspecified 11/06/2019   PFIZER(Purple Top)SARS-COV-2 Vaccination 09/14/2019, 10/07/2019   Pneumococcal Polysaccharide-23 11/19/2017, 11/06/2019   Tdap 03/08/2017    TDAP status: Up to date  Flu Vaccine status: Up to date  Pneumococcal vaccine status: Up to date  Covid-19 vaccine status: Information provided on how to obtain vaccines.   Qualifies for Shingles Vaccine? No   Zostavax completed No   Shingrix Completed?: No.    Education has been provided regarding the importance of this vaccine. Patient has been advised to  call insurance company to determine out of pocket expense if they have not yet received this vaccine. Advised may also receive vaccine at local pharmacy or Health Dept. Verbalized acceptance and understanding.  Screening Tests Health Maintenance  Topic Date Due   OPHTHALMOLOGY EXAM  Never done   COVID-19 Vaccine (3 - Pfizer risk series) 11/04/2019   COLONOSCOPY (Pts 45-33yr Insurance coverage will need to be confirmed)  08/23/2021 (Originally 01/24/2019)   Pneumococcal Vaccine 118619Years old (3 - PCV) 01/24/2040 (Originally 11/05/2020)   HEMOGLOBIN A1C  02/19/2021   FOOT EXAM  08/23/2021   PAP SMEAR-Modifier  12/15/2021   TETANUS/TDAP  03/09/2027   INFLUENZA VACCINE  Completed   Hepatitis C Screening  Completed   HIV Screening  Completed   HPV VACCINES  Aged Out    Health Maintenance  Health Maintenance Due  Topic Date Due   OPHTHALMOLOGY EXAM  Never done   COVID-19 Vaccine (3 - [facility]isk series) 11/04/2019    Colorectal cancer screening: No longer required.   Mammogram status: Ordered  . Pt provided with contact info and advised to call to schedule appt.     Lung Cancer Screening: (Low Dose CT Chest recommended if Age 47-80years, 30 pack-year currently smoking OR have quit w/in 15years.) does not qualify.   Lung Cancer Screening Referral:   Additional Screening:  Hepatitis C Screening: does not qualify; Completed 2021  Vision Screening: Recommended annual ophthalmology exams for early detection of glaucoma and other disorders of the eye. Is the patient up to date with their annual eye exam?  No  Who is the provider or what is the name of the office in which the patient attends annual eye exams?  If pt is not established with a provider, would they like to be referred to a provider to establish care? No .   Dental Screening: Recommended annual dental exams for proper oral hygiene  Community Resource Referral / Chronic Care Management: CRR required this visit?  No    CCM required this visit?  No      Plan:     I have personally reviewed and noted the following in the patient's chart:   Medical and social history Use of alcohol, tobacco or illicit drugs  Current medications and supplements including opioid prescriptions.  Functional ability and status Nutritional status Physical activity Advanced directives List of other physicians Hospitalizations, surgeries, and ER visits in previous 12 months Vitals Screenings to include cognitive, depression, and falls Referrals and appointments  In addition, I have reviewed and discussed with patient certain preventive protocols, quality metrics, and best practice recommendations. A written personalized care plan for preventive services as well as general preventive health recommendations were provided to patient.     JLeroy Kennedy LPN   [id number]  Nurse Notes:

## 2021-01-23 ENCOUNTER — Other Ambulatory Visit: Payer: Self-pay | Admitting: Nurse Practitioner

## 2021-01-23 DIAGNOSIS — G2111 Neuroleptic induced parkinsonism: Secondary | ICD-10-CM

## 2021-01-24 NOTE — Telephone Encounter (Signed)
Requested medication (s) are due for refill today: has current rx for 15 months written 08/2020  Requested medication (s) are on the active medication list: yes  Last refill:  09/21/20  Future visit scheduled: 02/24/21  Notes to clinic:  This medication can not be delegated, please assess. Has refills left at same pharm.   Requested Prescriptions  Pending Prescriptions Disp Refills   primidone (MYSOLINE) 50 MG tablet [Pharmacy Med Name: Primidone 50 MG Oral Tablet] 90 tablet 0    Sig: TAKE 1 TABLET BY MOUTH AT BEDTIME     Not Delegated - Neurology:  Anticonvulsants Failed - 01/23/2021 12:49 PM      Failed - This refill cannot be delegated      Failed - HCT in normal range and within 360 days    HCT  Date Value Ref Range Status  01/14/2020 41.7 36.0 - 46.0 % Final   Hematocrit  Date Value Ref Range Status  12/08/2019 45.2 34.0 - 46.6 % Final          Failed - HGB in normal range and within 360 days    Hemoglobin  Date Value Ref Range Status  01/14/2020 13.4 12.0 - 15.0 g/dL Final  12/08/2019 14.8 11.1 - 15.9 g/dL Final          Failed - PLT in normal range and within 360 days    Platelets  Date Value Ref Range Status  01/14/2020 330 150 - 400 K/uL Final  12/08/2019 358 150 - 450 x10E3/uL Final          Failed - WBC in normal range and within 360 days    WBC  Date Value Ref Range Status  01/14/2020 10.3 4.0 - 10.5 K/uL Final          Passed - Valid encounter within last 12 months    Recent Outpatient Visits           3 months ago Restless leg syndrome   [place], [place] T, NP   4 months ago Foot pain, right   [employer], [place] T, NP   5 months ago Type 2 diabetes mellitus with obesity ([place])   [place], Jolene T, NP   10 months ago Right foot pain   [place], [place] T, NP   11 months ago [place], Barbaraann Faster, NP       Future  Appointments             In 1 month [place], Barbaraann Faster, NP [facility]

## 2021-02-02 ENCOUNTER — Telehealth: Payer: Self-pay | Admitting: Nurse Practitioner

## 2021-02-02 NOTE — Telephone Encounter (Signed)
Please disregard

## 2021-02-03 DIAGNOSIS — C3400 Malignant neoplasm of unspecified main bronchus: Secondary | ICD-10-CM | POA: Diagnosis not present

## 2021-02-03 DIAGNOSIS — J4541 Moderate persistent asthma with (acute) exacerbation: Secondary | ICD-10-CM | POA: Diagnosis not present

## 2021-02-03 DIAGNOSIS — J9601 Acute respiratory failure with hypoxia: Secondary | ICD-10-CM | POA: Diagnosis not present

## 2021-02-07 ENCOUNTER — Telehealth: Payer: Self-pay

## 2021-02-07 NOTE — Telephone Encounter (Signed)
pt husband called left a message that his wife needed her medications refilled.

## 2021-02-07 NOTE — Telephone Encounter (Signed)
spoke with patient was told that she was dismissed and that she would have to go to [place] / [place] or go to the [facility] center to get medication refills until she can find another provider.  Pt was given contact info

## 2021-02-11 ENCOUNTER — Ambulatory Visit
Admission: EM | Admit: 2021-02-11 | Discharge: 2021-02-11 | Disposition: A | Discharge status: Home / Self Care | Payer: Medicare HMO | Attending: Physician Assistant | Admitting: Physician Assistant

## 2021-02-11 ENCOUNTER — Encounter: Payer: Self-pay | Admitting: Emergency Medicine

## 2021-02-11 ENCOUNTER — Other Ambulatory Visit: Payer: Self-pay

## 2021-02-11 DIAGNOSIS — I10 Essential (primary) hypertension: Secondary | ICD-10-CM | POA: Insufficient documentation

## 2021-02-11 DIAGNOSIS — E669 Obesity, unspecified: Secondary | ICD-10-CM | POA: Insufficient documentation

## 2021-02-11 DIAGNOSIS — B349 Viral infection, unspecified: Secondary | ICD-10-CM | POA: Diagnosis not present

## 2021-02-11 DIAGNOSIS — R110 Nausea: Secondary | ICD-10-CM

## 2021-02-11 DIAGNOSIS — F419 Anxiety disorder, unspecified: Secondary | ICD-10-CM | POA: Insufficient documentation

## 2021-02-11 DIAGNOSIS — R69 Illness, unspecified: Secondary | ICD-10-CM | POA: Diagnosis not present

## 2021-02-11 DIAGNOSIS — R051 Acute cough: Secondary | ICD-10-CM | POA: Diagnosis not present

## 2021-02-11 DIAGNOSIS — J45909 Unspecified asthma, uncomplicated: Secondary | ICD-10-CM | POA: Diagnosis not present

## 2021-02-11 DIAGNOSIS — E785 Hyperlipidemia, unspecified: Secondary | ICD-10-CM | POA: Insufficient documentation

## 2021-02-11 DIAGNOSIS — F319 Bipolar disorder, unspecified: Secondary | ICD-10-CM | POA: Diagnosis not present

## 2021-02-11 DIAGNOSIS — Z20822 Contact with and (suspected) exposure to covid-19: Secondary | ICD-10-CM | POA: Diagnosis not present

## 2021-02-11 DIAGNOSIS — Z6832 Body mass index (BMI) 32.0-32.9, adult: Secondary | ICD-10-CM | POA: Diagnosis not present

## 2021-02-11 DIAGNOSIS — R0981 Nasal congestion: Secondary | ICD-10-CM | POA: Diagnosis not present

## 2021-02-11 LAB — RAPID INFLUENZA A&B ANTIGENS
Influenza A (ARMC): NEGATIVE
Influenza B (ARMC): NEGATIVE

## 2021-02-11 MED ORDER — PROMETHAZINE-DM 6.25-15 MG/5ML PO SYRP: 5.0000 mL | ORAL_SOLUTION | Freq: Four times a day (QID) | ORAL | 0 refills | Status: DC | PRN

## 2021-02-11 MED ORDER — ONDANSETRON HCL 4 MG PO TABS: 4.0000 mg | ORAL_TABLET | Freq: Four times a day (QID) | ORAL | 0 refills | Status: AC | PRN

## 2021-02-11 NOTE — ED Triage Notes (Signed)
Patient c/o cough, congestion, stomach pain, nausea, and headache and fatigue that started on Wed.  Patient denies fevers.

## 2021-02-11 NOTE — ED Provider Notes (Signed)
[facility]    CSN: [id number] Arrival date & time: 02/11/21  1424      History   Chief Complaint Chief Complaint  Patient presents with   Headache   Abdominal Pain   Fatigue   Cough    HPI Vanessa Baker is a 46 y.o. female presenting for 3-day history of fatigue, body aches, cough, congestion, abdominal cramping, nausea, headaches.  Denies fever, breathing difficulty, vomiting or diarrhea.  No sick contacts or known exposure to COVID or flu.  Has not taken any over-the-counter medicine for symptoms.  History of asthma, anxiety, bipolar disorder, hypertension, hyperlipidemia, obesity.  HPI  Past Medical History:  Diagnosis Date   Allergy    Anxiety    Asthma    Bipolar disorder ([place])    Bursitis    Depression    Diabetes mellitus without complication ([place])    Essential hypertension 03/28/2017   Frequent headaches    GERD (gastroesophageal reflux disease)    Headache    High cholesterol    Hypertension    Irritable bowel syndrome (IBS)    Irritable colon 02/23/2010   Migraine 04/02/2017   Obesity (BMI 30-39.9) 11/25/2013   Seizure ([place]) [id number]   Seizures ([place])    Tremor     Patient Active Problem List   Diagnosis Date Noted   Bipolar 1 disorder, mixed, moderate ([place]) 12/08/2020   Scar tissue 09/20/2020   Irregular menses 08/23/2020   Foot pain, right 03/28/2020   Memory loss 01/13/2020   Dysphagia 01/13/2020   Sexual dysfunction, unspecified 12/22/2019   Vitamin B12 deficiency 12/16/2019   Gastroesophageal reflux disease 12/15/2019   Bipolar disorder, in full remission, most recent episode depressed ([place]) 12/11/2019   Nicotine dependence, cigarettes, uncomplicated 00/86/7619   Financial difficulties 08/25/2019   Obesity 08/25/2019   S/P laparoscopic cholecystectomy 04/29/2019   High risk medication use 04/09/2019   Cocaine use disorder, moderate, in sustained remission ([place]) 03/16/2019   Chronic hip pain, bilateral 01/09/2019   Type 2  diabetes mellitus with obesity ([place]) 12/23/2018   Restless leg syndrome 11/17/2018   Cannabis use disorder, mild, in early remission 08/06/2018   Alcohol use disorder, mild, in early remission 08/06/2018   Neuroleptic induced parkinsonism ([place]) 08/06/2018   PTSD (post-traumatic stress disorder) 08/06/2018   Migraine 04/02/2017   Hypertension associated with diabetes ([place]) 03/28/2017   Moderate persistent asthma without complication 50/93/2671   Hyperlipidemia associated with type 2 diabetes mellitus ([place]) 05/17/2015   Insomnia 03/31/2015   Regular astigmatism 05/05/2010    Past Surgical History:  Procedure Laterality Date   CHOLECYSTECTOMY N/A 04/27/2019   Procedure: LAPAROSCOPIC CHOLECYSTECTOMY;  Surgeon: Fredirick Maudlin, MD;  Location: [facility] ORS;  Service: General;  Laterality: N/A;    OB History   No obstetric history on file.      Home Medications    Prior to Admission medications   Medication Sig Start Date End Date Taking? Authorizing Provider  atorvastatin (LIPITOR) 40 MG tablet Take 1 tablet (40 mg total) by mouth daily. 08/23/20  Yes Cannady, Jolene T, NP  carbamazepine (TEGRETOL) 200 MG tablet Take 1-1.5 tablets (200-300 mg total) by mouth as directed. Start taking 1 tablet daily AM and 1.5 tablets daily PM 12/08/20  Yes Eappen, Ria Clock, MD  Dulaglutide (TRULICITY) 1.5 IW/5.8KD SOPN INJECT 1.$RemoveBefor'5MG'zcSwbaLKAmIG$  SUBCUTANEOUSLY ONCE WEEKLY 09/21/20  Yes Cannady, Jolene T, NP  fluticasone-salmeterol (ADVAIR HFA) 115-21 MCG/ACT inhaler Inhale 2 puffs into the lungs 2 (two) times daily. 08/23/20  Yes Cannady, Jolene T,  NP  gabapentin (NEURONTIN) 300 MG capsule Take 300 mg by mouth 2 (two) times daily. 11/24/20  Yes [provider]  lisinopril (ZESTRIL) 10 MG tablet TAKE 1 TABLET BY MOUTH ONCE DAILY 10/26/20  Yes Cannady, Jolene T, NP  metFORMIN (GLUCOPHAGE) 500 MG tablet Take 1 tablet (500 mg total) by mouth 2 (two) times daily. 08/23/20  Yes Cannady, Jolene T, NP  Methylcobalamin (B-12)  1000 MCG TBDP Take 1,000 mcg by mouth daily. 08/23/20  Yes Cannady, Jolene T, NP  OLANZapine (ZYPREXA) 20 MG tablet Take 1 tablet (20 mg total) by mouth at bedtime. 12/08/20  Yes Ursula Alert, MD  omega-3 acid ethyl esters (LOVAZA) 1 g capsule Take 1 capsule (1 g total) by mouth 2 (two) times daily. 08/23/20  Yes Cannady, Jolene T, NP  omeprazole (PRILOSEC) 20 MG capsule Take 1 capsule (20 mg total) by mouth daily. 08/23/20  Yes Cannady, Jolene T, NP  ondansetron (ZOFRAN) 4 MG tablet Take 1 tablet (4 mg total) by mouth every 6 (six) hours as needed for up to 5 days for nausea or vomiting. 02/11/21 02/16/21 Yes Laurene Footman B, PA-C  pregabalin (LYRICA) 50 MG capsule Take 1 capsule (50 mg total) by mouth at bedtime. 10/25/20  Yes Cannady, Jolene T, NP  primidone (MYSOLINE) 50 MG tablet TAKE 1 TABLET BY MOUTH AT BEDTIME 01/24/21   Cannady, Jolene T, NP  promethazine-dextromethorphan (PROMETHAZINE-DM) 6.25-15 MG/5ML syrup Take 5 mLs by mouth 4 (four) times daily as needed. 02/11/21  Yes Laurene Footman B, PA-C  sertraline (ZOLOFT) 50 MG tablet Take 1 tablet by mouth once daily 11/25/20  Yes Eappen, Saramma, MD  traZODone (DESYREL) 100 MG tablet TAKE 1 & 1/2 (ONE & ONE-HALF) TABLETS BY MOUTH AT BEDTIME FOR SLEEP 12/08/20  Yes Eappen, Ria Clock, MD  ACCU-CHEK AVIVA PLUS test strip USE 1 STRIP TO CHECK GLUCOSE UP TO 3 TIMES DAILY AS DIRECTED. 12/23/18   Parks Ranger, Devonne Doughty, DO  Accu-Chek Softclix Lancets lancets Use to check blood sugar up to 3 times daily as instructed 12/23/18   Karamalegos, Alexander J, DO  albuterol (VENTOLIN HFA) 108 (90 Base) MCG/ACT inhaler INHALE 2 PUFFS BY MOUTH EVERY 6 HOURS AS NEEDED FOR WHEEZING AND FOR SHORTNESS OF BREATH 06/14/20   Marnee Guarneri T, NP  baclofen (LIORESAL) 10 MG tablet Take 1 tablet by mouth three times daily as needed for muscle spasm 08/22/20   Marnee Guarneri T, NP  BINAXNOW COVID-19 AG HOME TEST KIT Use as Directed on the Package Patient not taking: Reported on  01/16/2021 11/22/20   [provider]  blood glucose meter kit and supplies Dispense based on patient and insurance preference. Use up to four times daily as directed. (FOR ICD-10 E10.9, E11.9). 09/12/17   Mikey College, NP  Blood Glucose Monitoring Suppl (ACCU-CHEK AVIVA PLUS) w/Device KIT Use to check blood sugar up to 3 times daily 12/23/18   Olin Hauser, DO  Calcium Carbonate+Vitamin D (CALCIUM 600+D3) 600-200 MG-UNIT TABS Take 1 tablet by mouth daily. 08/23/20   Cannady, Henrine Screws T, NP  cetirizine (ZYRTEC) 10 MG tablet Take 1 tablet (10 mg total) by mouth daily as needed for allergies (itching). Patient not taking: Reported on 01/16/2021 12/08/19   Myles Gip, DO  norethindrone-ethinyl estradiol-FE (JUNEL FE 1/20) 1-20 MG-MCG tablet Take 1 tablet by mouth daily. Patient not taking: Reported on 01/16/2021 08/23/20   Marnee Guarneri T, NP  rOPINIRole (REQUIP) 0.25 MG tablet Take 1 tablet (0.25 mg total) by mouth at  bedtime. 12/08/20   Ursula Alert, MD  tiZANidine (ZANAFLEX) 4 MG tablet  11/03/18   [provider]  triamcinolone (KENALOG) 0.1 % Apply 1 application topically 2 (two) times daily. Patient not taking: Reported on 12/08/2020 03/28/20   Venita Lick, NP    Family History Family History  Problem Relation Age of Onset   Bipolar disorder Mother    Schizophrenia Mother    Hypertension Mother    Diabetes Mother    Cancer Mother    Anxiety disorder Mother    ADD / ADHD Mother    Alcohol abuse Mother    Drug abuse Mother    Heart disease Mother    Bipolar disorder Sister    Anxiety disorder Sister    Drug abuse Sister    Bipolar disorder Brother    Anxiety disorder Brother    Drug abuse Brother    Hypertension Father    Cancer Father    Diabetes Father    Breast cancer Maternal Aunt    Leukemia Maternal Aunt    Breast cancer Paternal Aunt    Breast cancer Maternal Grandmother    Alcohol abuse Maternal Grandmother    Bipolar  disorder Maternal Grandmother    Breast cancer Paternal Grandmother    Parkinson's disease Paternal Grandmother    Alcohol abuse Maternal Grandfather    Prostate cancer Maternal Grandfather    Alcohol abuse Maternal Uncle    Multiple myeloma Maternal Uncle    ADD / ADHD Son    Throat cancer Maternal Aunt    Tremor Neg Hx     Social History Social History   Tobacco Use   Smoking status: Former    Packs/day: 0.50    Years: 27.00    Pack years: 13.50    Types: Cigarettes    Quit date: 04/26/2019    Years since quitting: 1.8   Smokeless tobacco: Never  Vaping Use   Vaping Use: Former  Substance Use Topics   Alcohol use: Yes   Drug use: No     Allergies   Amitriptyline and Wellbutrin [bupropion]   Review of Systems Review of Systems  Constitutional:  Positive for fatigue. Negative for chills, diaphoresis and fever.  HENT:  Positive for congestion and rhinorrhea. Negative for ear pain, sinus pressure, sinus pain and sore throat.   Respiratory:  Positive for cough. Negative for shortness of breath and wheezing.   Cardiovascular:  Negative for chest pain.  Gastrointestinal:  Positive for abdominal pain and nausea. Negative for diarrhea and vomiting.  Musculoskeletal:  Negative for arthralgias and myalgias.  Skin:  Negative for rash.  Neurological:  Positive for headaches. Negative for weakness.  Hematological:  Negative for adenopathy.    Physical Exam Triage Vital Signs ED Triage Vitals  Enc Vitals Group     BP 02/11/21 1501 130/73     Pulse Rate 02/11/21 1501 78     Resp 02/11/21 1501 14     Temp 02/11/21 1501 98.4 F (36.9 C)     Temp Source 02/11/21 1501 Oral     SpO2 02/11/21 1501 100 %     Weight 02/11/21 1457 176 lb (79.8 kg)     Height 02/11/21 1457 $RemoveBefor'5\' 2"'IbXzmvbIUQic$  (1.575 m)     Head Circumference --      Peak Flow --      Pain Score 02/11/21 1457 8     Pain Loc --      Pain Edu? --      Excl.  in [place]? --    No data found.  Updated Vital Signs BP 130/73 (BP  Location: Left Arm)    Pulse 78    Temp 98.4 F (36.9 C) (Oral)    Resp 14    Ht $R'5\' 2"'Ti$  (1.575 m)    Wt 176 lb (79.8 kg)    LMP 11/26/2018 Comment: pt denies pregnancy-states no period in two years   SpO2 100%    BMI 32.19 kg/m      Physical Exam Vitals and nursing note reviewed.  Constitutional:      General: She is not in acute distress.    Appearance: Normal appearance. She is well-developed. She is ill-appearing. She is not toxic-appearing.  HENT:     Head: Normocephalic and atraumatic.     Nose: Congestion and rhinorrhea present.     Mouth/Throat:     Mouth: Mucous membranes are moist.     Pharynx: Oropharynx is clear.  Eyes:     General: No scleral icterus.       Right eye: No discharge.        Left eye: No discharge.     Conjunctiva/sclera: Conjunctivae normal.  Cardiovascular:     Rate and Rhythm: Normal rate and regular rhythm.     Heart sounds: Normal heart sounds.  Pulmonary:     Effort: Pulmonary effort is normal. No respiratory distress.     Breath sounds: Normal breath sounds. No wheezing, rhonchi or rales.  Abdominal:     Palpations: Abdomen is soft.     Tenderness: There is abdominal tenderness (generalized). There is no guarding or rebound.  Musculoskeletal:     Cervical back: Neck supple.  Skin:    General: Skin is dry.  Neurological:     General: No focal deficit present.     Mental Status: She is alert. Mental status is at baseline.     Motor: No weakness.     Gait: Gait normal.  Psychiatric:        Mood and Affect: Mood normal.        Behavior: Behavior normal.        Thought Content: Thought content normal.     UC Treatments / Results  Labs (all labs ordered are listed, but only abnormal results are displayed) Labs Reviewed  RAPID INFLUENZA A&B ANTIGENS  SARS CORONAVIRUS 2 (TAT 6-24 HRS)    EKG   Radiology No results found.  Procedures Procedures (including critical care time)  Medications Ordered in UC Medications - No data to  display  Initial Impression / Assessment and Plan / UC Course  I have reviewed the triage vital signs and the nursing notes.  Pertinent labs & imaging results that were available during my care of the patient were reviewed by me and considered in my medical decision making (see chart for details).  46 year old female presenting for 3-day history of fatigue, cough, congestion, abdominal cramping, nausea, headaches.  Vitals all normal and stable.  Patient ill-appearing but nontoxic.  On exam she has nasal congestion and clear rhinorrhea.  Chest clear to auscultation heart regular rate and rhythm.  Generalized abdominal tenderness.  Rapid flu testing negative.  PCR COVID testing.  Current CDC guidelines, isolation protocol and ED precautions reviewed with patient.  Advised patient symptoms assistant with viral illness, could be COVID-19 or other viral illness.  Supportive care encouraged with increasing rest and fluids.  Sent Zofran and Promethazine DM.  Tylenol for discomfort.  Reviewed return and ER precautions.  Final Clinical Impressions(s) / UC Diagnoses   Final diagnoses:  Viral illness  Acute cough  Nasal congestion  Nausea without vomiting     Discharge Instructions      -Negative flu. Likely another viral illness or COVID  You have received COVID testing today either for positive exposure, concerning symptoms that could be related to COVID infection, screening purposes, or re-testing after confirmed positive.  Your test obtained today checks for active viral infection in the last 1-2 weeks. If your test is negative now, you can still test positive later. So, if you do develop symptoms you should either get re-tested and/or isolate x 5 days and then strict mask use x 5 days (unvaccinated) or mask use x 10 days (vaccinated). Please follow CDC guidelines.  While Rapid antigen tests come back in 15-20 minutes, send out PCR/molecular test results typically come back within 1-3  days. In the mean time, if you are symptomatic, assume this could be a positive test and treat/monitor yourself as if you do have COVID.   We will call with test results if positive. Please download the MyChart app and set up a profile to access test results.   If symptomatic, go home and rest. Push fluids. Take Tylenol as needed for discomfort. Gargle warm salt water. Throat lozenges. Take Mucinex DM or Robitussin for cough. Humidifier in bedroom to ease coughing. Warm showers. Also review the COVID handout for more information.  COVID-19 INFECTION: The incubation period of COVID-19 is approximately 14 days after exposure, with most symptoms developing in roughly 4-5 days. Symptoms may range in severity from mild to critically severe. Roughly 80% of those infected will have mild symptoms. People of any age may become infected with COVID-19 and have the ability to transmit the virus. The most common symptoms include: fever, fatigue, cough, body aches, headaches, sore throat, nasal congestion, shortness of breath, nausea, vomiting, diarrhea, changes in smell and/or taste.    COURSE OF ILLNESS Some patients may begin with mild disease which can progress quickly into critical symptoms. If your symptoms are worsening please call ahead to the Emergency Department and proceed there for further treatment. Recovery time appears to be roughly 1-2 weeks for mild symptoms and 3-6 weeks for severe disease.   GO IMMEDIATELY TO ER FOR FEVER YOU ARE UNABLE TO GET DOWN WITH TYLENOL, BREATHING PROBLEMS, CHEST PAIN, FATIGUE, LETHARGY, INABILITY TO EAT OR DRINK, ETC  QUARANTINE AND ISOLATION: To help decrease the spread of COVID-19 please remain isolated if you have COVID infection or are highly suspected to have COVID infection. This means -stay home and isolate to one room in the home if you live with others. Do not share a bed or bathroom with others while ill, sanitize and wipe down all countertops and keep common  areas clean and disinfected. Stay home for 5 days. If you have no symptoms or your symptoms are resolving after 5 days, you can leave your house. Continue to wear a mask around others for 5 additional days. If you have been in close contact (within 6 feet) of someone diagnosed with COVID 19, you are advised to quarantine in your home for 14 days as symptoms can develop anywhere from 2-14 days after exposure to the virus. If you develop symptoms, you  must isolate.  Most current guidelines for COVID after exposure -unvaccinated: isolate 5 days and strict mask use x 5 days. Test on day 5 is possible -vaccinated: wear mask x 10 days if symptoms do not  develop -You do not necessarily need to be tested for COVID if you have + exposure and  develop symptoms. Just isolate at home x10 days from symptom onset During this global pandemic, CDC advises to practice social distancing, try to stay at least 71ft away from others at all times. Wear a face covering. Wash and sanitize your hands regularly and avoid going anywhere that is not necessary.  KEEP IN MIND THAT THE COVID TEST IS NOT 100% ACCURATE AND YOU SHOULD STILL DO EVERYTHING TO PREVENT POTENTIAL SPREAD OF VIRUS TO OTHERS (WEAR MASK, WEAR GLOVES, [place] HANDS AND SANITIZE REGULARLY). IF INITIAL TEST IS NEGATIVE, THIS MAY NOT MEAN YOU ARE DEFINITELY NEGATIVE. MOST ACCURATE TESTING IS DONE 5-7 DAYS AFTER EXPOSURE.   It is not advised by CDC to get re-tested after receiving a positive COVID test since you can still test positive for weeks to months after you have already cleared the virus.   *If you have not been vaccinated for COVID, I strongly suggest you consider getting vaccinated as long as there are no contraindications.       ED Prescriptions     Medication Sig Dispense Auth. Provider   ondansetron (ZOFRAN) 4 MG tablet Take 1 tablet (4 mg total) by mouth every 6 (six) hours as needed for up to 5 days for nausea or vomiting. 20 tablet Danton Clap, PA-C   promethazine-dextromethorphan (PROMETHAZINE-DM) 6.25-15 MG/5ML syrup Take 5 mLs by mouth 4 (four) times daily as needed. 118 mL Danton Clap, PA-C      PDMP not reviewed this encounter.   Danton Clap, PA-C 02/11/21 1552

## 2021-02-11 NOTE — Discharge Instructions (Signed)
-Negative flu. Likely another viral illness or COVID  You have received COVID testing today either for positive exposure, concerning symptoms that could be related to COVID infection, screening purposes, or re-testing after confirmed positive.  Your test obtained today checks for active viral infection in the last 1-2 weeks. If your test is negative now, you can still test positive later. So, if you do develop symptoms you should either get re-tested and/or isolate x 5 days and then strict mask use x 5 days (unvaccinated) or mask use x 10 days (vaccinated). Please follow CDC guidelines.  While Rapid antigen tests come back in 15-20 minutes, send out PCR/molecular test results typically come back within 1-3 days. In the mean time, if you are symptomatic, assume this could be a positive test and treat/monitor yourself as if you do have COVID.   We will call with test results if positive. Please download the MyChart app and set up a profile to access test results.   If symptomatic, go home and rest. Push fluids. Take Tylenol as needed for discomfort. Gargle warm salt water. Throat lozenges. Take Mucinex DM or Robitussin for cough. Humidifier in bedroom to ease coughing. Warm showers. Also review the COVID handout for more information.  COVID-19 INFECTION: The incubation period of COVID-19 is approximately 14 days after exposure, with most symptoms developing in roughly 4-5 days. Symptoms may range in severity from mild to critically severe. Roughly 80% of those infected will have mild symptoms. People of any age may become infected with COVID-19 and have the ability to transmit the virus. The most common symptoms include: fever, fatigue, cough, body aches, headaches, sore throat, nasal congestion, shortness of breath, nausea, vomiting, diarrhea, changes in smell and/or taste.    COURSE OF ILLNESS Some patients may begin with mild disease which can progress quickly into critical symptoms. If your symptoms  are worsening please call ahead to the Emergency Department and proceed there for further treatment. Recovery time appears to be roughly 1-2 weeks for mild symptoms and 3-6 weeks for severe disease.   GO IMMEDIATELY TO ER FOR FEVER YOU ARE UNABLE TO GET DOWN WITH TYLENOL, BREATHING PROBLEMS, CHEST PAIN, FATIGUE, LETHARGY, INABILITY TO EAT OR DRINK, ETC  QUARANTINE AND ISOLATION: To help decrease the spread of COVID-19 please remain isolated if you have COVID infection or are highly suspected to have COVID infection. This means -stay home and isolate to one room in the home if you live with others. Do not share a bed or bathroom with others while ill, sanitize and wipe down all countertops and keep common areas clean and disinfected. Stay home for 5 days. If you have no symptoms or your symptoms are resolving after 5 days, you can leave your house. Continue to wear a mask around others for 5 additional days. If you have been in close contact (within 6 feet) of someone diagnosed with COVID 19, you are advised to quarantine in your home for 14 days as symptoms can develop anywhere from 2-14 days after exposure to the virus. If you develop symptoms, you  must isolate.  Most current guidelines for COVID after exposure -unvaccinated: isolate 5 days and strict mask use x 5 days. Test on day 5 is possible -vaccinated: wear mask x 10 days if symptoms do not develop -You do not necessarily need to be tested for COVID if you have + exposure and  develop symptoms. Just isolate at home x10 days from symptom onset During this global pandemic, CDC advises  to practice social distancing, try to stay at least 58ft away from others at all times. Wear a face covering. Wash and sanitize your hands regularly and avoid going anywhere that is not necessary.  KEEP IN MIND THAT THE COVID TEST IS NOT 100% ACCURATE AND YOU SHOULD STILL DO EVERYTHING TO PREVENT POTENTIAL SPREAD OF VIRUS TO OTHERS (WEAR MASK, WEAR GLOVES, [place] HANDS  AND SANITIZE REGULARLY). IF INITIAL TEST IS NEGATIVE, THIS MAY NOT MEAN YOU ARE DEFINITELY NEGATIVE. MOST ACCURATE TESTING IS DONE 5-7 DAYS AFTER EXPOSURE.   It is not advised by CDC to get re-tested after receiving a positive COVID test since you can still test positive for weeks to months after you have already cleared the virus.   *If you have not been vaccinated for COVID, I strongly suggest you consider getting vaccinated as long as there are no contraindications.

## 2021-02-12 LAB — SARS CORONAVIRUS 2 (TAT 6-24 HRS): SARS Coronavirus 2: NEGATIVE

## 2021-02-13 ENCOUNTER — Telehealth: Payer: Self-pay

## 2021-02-13 NOTE — Telephone Encounter (Signed)
° °  Telephone encounter was:  Successful.  02/13/2021 Name: Vanessa Baker MRN: [id number] DOB: 1975-12-09  Vanessa Baker is a 46 y.o. year old female who is a primary care patient of Cannady, Barbaraann Faster, NP . The community resource team was consulted for assistance with Food Insecurity and Financial Difficulties related to Housing and utilities.  Care guide performed the following interventions: Patient provided with information about care guide support team and interviewed to confirm resource needs.  Follow Up Plan:  Care guide will follow up with patient by phone over the next the next day     [place], Care Management  [phone] [address], [place], [place] [address] Phone: [phone] Email: [email or web address]

## 2021-02-13 NOTE — Telephone Encounter (Signed)
° °  Telephone encounter was:  Successful.  02/13/2021 Name: [name] MRN: [id number] DOB: 1975-03-01  Vanessa Baker is a 46 y.o. year old female who is a primary care patient of Cannady, Barbaraann Faster, NP . The community resource team was consulted for assistance with Financial Difficulties related to not able to pay rent and utilities. Late entry 02/02/2021 referal called addressed needs , called again 02/03/2021 to ask patient if she recived email with resources sent. Patient was sick but later responed that she had recived all the information  Care guide performed the following interventions: Patient provided with information about care guide support team and interviewed to confirm resource needs Follow up call placed to the patient to discuss status of referral.  Follow Up Plan:  No further follow up planned at this time. The patient has been provided with needed resources.   [place], Care Management  [phone] [address], [place], [place] [address] Phone: [phone] Email: [email or web address]

## 2021-02-17 ENCOUNTER — Telehealth: Payer: Medicare HMO

## 2021-02-17 ENCOUNTER — Ambulatory Visit: Payer: Self-pay | Admitting: *Deleted

## 2021-02-17 NOTE — Telephone Encounter (Signed)
Third attempt to call patient back- left message to call office

## 2021-02-17 NOTE — Telephone Encounter (Signed)
Summary: dizziness and diarrhea with nausea   Patient call in with dizziness, says she's had since /. She also has nausea and diarrhea     Attempted to call patient - left message to call office.

## 2021-02-17 NOTE — Telephone Encounter (Signed)
Noted  

## 2021-02-17 NOTE — Telephone Encounter (Signed)
Second attempt to reach patient- left message to call office

## 2021-02-17 NOTE — Telephone Encounter (Signed)
Reason for Disposition  [1] MODERATE dizziness (e.g., interferes with normal activities) AND [2] has NOT been evaluated by physician for this  (Exception: dizziness caused by heat exposure, sudden standing, or poor fluid intake)  Answer Assessment - Initial Assessment Questions 1. DESCRIPTION: "Describe your dizziness."     Feel like passing out 2. LIGHTHEADED: "Do you feel lightheaded?" (e.g., somewhat faint, woozy, weak upon standing)     yes 3. VERTIGO: "Do you feel like either you or the room is spinning or tilting?" (i.e. vertigo)     sometimes 4. SEVERITY: "How bad is it?"  "Do you feel like you are going to faint?" "Can you stand and walk?"   - MILD: Feels slightly dizzy, but walking normally.   - MODERATE: Feels unsteady when walking, but not falling; interferes with normal activities (e.g., school, work).   - SEVERE: Unable to walk without falling, or requires assistance to walk without falling; feels like passing out now.      moderate 5. ONSET:  "When did the dizziness begin?"     02/07/21 10. OTHER SYMPTOMS: "Do you have any other symptoms?" (e.g., fever, chest pain, vomiting, diarrhea, bleeding)       Hot/cold flashes, diarrhea, headache  Protocols used: Dizziness - Lightheadedness-A-AH

## 2021-02-17 NOTE — Telephone Encounter (Signed)
°  Chief Complaint: dizziness Symptoms: dizziness, diarrhea, headache, hot/cold flashes Frequency: 10 days Pertinent Negatives: NA Disposition: [] ED /[] Urgent Care (no appt availability in office) / [] Appointment(In office/virtual)/ [x]  [place] [place] [facility]/ [] Home Care/ [] Refused Recommended Disposition /[] [facility] [facility]/ []  Follow-up with PCP Additional Notes: Pt states she has been experiencing symptoms since 02/07/21. No available appt in office or virtual until 02/20/21. Scheduled pt for virtual UC visit for 1645 today d/t pt is wanting to not wait to be seen. Care advice given and pt verbalized understanding. No other questions/concerns noted.

## 2021-02-20 ENCOUNTER — Ambulatory Visit: Payer: Medicare HMO | Admitting: Nurse Practitioner

## 2021-02-20 DIAGNOSIS — F3162 Bipolar disorder, current episode mixed, moderate: Secondary | ICD-10-CM

## 2021-02-20 DIAGNOSIS — E1169 Type 2 diabetes mellitus with other specified complication: Secondary | ICD-10-CM

## 2021-02-20 DIAGNOSIS — E1159 Type 2 diabetes mellitus with other circulatory complications: Secondary | ICD-10-CM

## 2021-02-20 DIAGNOSIS — J4540 Moderate persistent asthma, uncomplicated: Secondary | ICD-10-CM

## 2021-02-20 DIAGNOSIS — E538 Deficiency of other specified B group vitamins: Secondary | ICD-10-CM

## 2021-02-20 DIAGNOSIS — G2111 Neuroleptic induced parkinsonism: Secondary | ICD-10-CM

## 2021-02-20 DIAGNOSIS — K219 Gastro-esophageal reflux disease without esophagitis: Secondary | ICD-10-CM

## 2021-02-23 ENCOUNTER — Other Ambulatory Visit: Payer: Self-pay | Admitting: Nurse Practitioner

## 2021-02-23 DIAGNOSIS — E785 Hyperlipidemia, unspecified: Secondary | ICD-10-CM

## 2021-02-23 DIAGNOSIS — E1169 Type 2 diabetes mellitus with other specified complication: Secondary | ICD-10-CM

## 2021-02-23 NOTE — Telephone Encounter (Signed)
°  Notes to clinic:  has current rx written 08/23/20 at same pharm for 15 months supply, #90 with 4 refills, has appt tomorrow 02/24/21, please assess.  Requested Prescriptions  Pending Prescriptions Disp Refills   atorvastatin (LIPITOR) 40 MG tablet [Pharmacy Med Name: Atorvastatin Calcium 40 MG Oral Tablet] 90 tablet 0    Sig: Take 1 tablet by mouth once daily     Cardiovascular:  Antilipid - Statins Failed - 02/23/2021  6:37 PM      Failed - HDL in normal range and within 360 days    HDL  Date Value Ref Range Status  08/19/2020 36 (L) >39 mg/dL Final          Failed - Triglycerides in normal range and within 360 days    Triglycerides  Date Value Ref Range Status  08/19/2020 218 (H) 0 - 149 mg/dL Final          Passed - Total Cholesterol in normal range and within 360 days    Cholesterol, Total  Date Value Ref Range Status  08/19/2020 123 100 - 199 mg/dL Final          Passed - LDL in normal range and within 360 days    LDL Cholesterol (Calc)  Date Value Ref Range Status  12/19/2018 54 mg/dL (calc) Final    Comment:    Reference range: <100 . Desirable range <100 mg/dL for primary prevention;   <70 mg/dL for patients with CHD or diabetic patients  with > or = 2 CHD risk factors. Marland Kitchen LDL-C is now calculated using the Martin-Hopkins  calculation, which is a validated novel method providing  better accuracy than the Friedewald equation in the  estimation of LDL-C.  Cresenciano Genre et al. Annamaria Helling. 6294;765(46): 2061-2068  (http://education.[employer].com/faq/FAQ164)    LDL Chol Calc (NIH)  Date Value Ref Range Status  08/19/2020 52 0 - 99 mg/dL Final          Passed - Patient is not pregnant      Passed - Valid encounter within last 12 months    Recent Outpatient Visits           4 months ago Restless leg syndrome   [place], Henrine Screws T, NP   5 months ago Foot pain, right   [facility] [place], Colp T, NP   6 months ago Type 2  diabetes mellitus with obesity ([place])   [place], Jolene T, NP   11 months ago Right foot pain   [facility] [place], [place] T, NP   12 months ago [place], Barbaraann Faster, NP       Future Appointments             Tomorrow Venita Lick, NP [place], [facility]

## 2021-02-24 ENCOUNTER — Ambulatory Visit: Payer: Medicare HMO | Admitting: Nurse Practitioner

## 2021-02-24 ENCOUNTER — Encounter: Payer: Self-pay | Admitting: Nurse Practitioner

## 2021-02-24 ENCOUNTER — Ambulatory Visit (INDEPENDENT_AMBULATORY_CARE_PROVIDER_SITE_OTHER): Payer: Medicare HMO | Admitting: Nurse Practitioner

## 2021-02-24 ENCOUNTER — Other Ambulatory Visit: Payer: Self-pay

## 2021-02-24 VITALS — BP 104/67 | HR 79 | Temp 98.8°F | Ht 62.0 in | Wt 172.0 lb

## 2021-02-24 DIAGNOSIS — E6609 Other obesity due to excess calories: Secondary | ICD-10-CM

## 2021-02-24 DIAGNOSIS — F1421 Cocaine dependence, in remission: Secondary | ICD-10-CM | POA: Diagnosis not present

## 2021-02-24 DIAGNOSIS — F4310 Post-traumatic stress disorder, unspecified: Secondary | ICD-10-CM

## 2021-02-24 DIAGNOSIS — E1169 Type 2 diabetes mellitus with other specified complication: Secondary | ICD-10-CM

## 2021-02-24 DIAGNOSIS — E538 Deficiency of other specified B group vitamins: Secondary | ICD-10-CM

## 2021-02-24 DIAGNOSIS — I152 Hypertension secondary to endocrine disorders: Secondary | ICD-10-CM | POA: Diagnosis not present

## 2021-02-24 DIAGNOSIS — J4540 Moderate persistent asthma, uncomplicated: Secondary | ICD-10-CM | POA: Diagnosis not present

## 2021-02-24 DIAGNOSIS — K219 Gastro-esophageal reflux disease without esophagitis: Secondary | ICD-10-CM | POA: Diagnosis not present

## 2021-02-24 DIAGNOSIS — N3091 Cystitis, unspecified with hematuria: Secondary | ICD-10-CM | POA: Insufficient documentation

## 2021-02-24 DIAGNOSIS — R197 Diarrhea, unspecified: Secondary | ICD-10-CM | POA: Diagnosis not present

## 2021-02-24 DIAGNOSIS — E785 Hyperlipidemia, unspecified: Secondary | ICD-10-CM | POA: Diagnosis not present

## 2021-02-24 DIAGNOSIS — E1159 Type 2 diabetes mellitus with other circulatory complications: Secondary | ICD-10-CM | POA: Diagnosis not present

## 2021-02-24 DIAGNOSIS — R69 Illness, unspecified: Secondary | ICD-10-CM | POA: Diagnosis not present

## 2021-02-24 DIAGNOSIS — G2581 Restless legs syndrome: Secondary | ICD-10-CM

## 2021-02-24 DIAGNOSIS — F3162 Bipolar disorder, current episode mixed, moderate: Secondary | ICD-10-CM

## 2021-02-24 DIAGNOSIS — F17210 Nicotine dependence, cigarettes, uncomplicated: Secondary | ICD-10-CM

## 2021-02-24 DIAGNOSIS — R8281 Pyuria: Secondary | ICD-10-CM | POA: Diagnosis not present

## 2021-02-24 DIAGNOSIS — E669 Obesity, unspecified: Secondary | ICD-10-CM

## 2021-02-24 DIAGNOSIS — Z6831 Body mass index (BMI) 31.0-31.9, adult: Secondary | ICD-10-CM

## 2021-02-24 DIAGNOSIS — Z1211 Encounter for screening for malignant neoplasm of colon: Secondary | ICD-10-CM

## 2021-02-24 LAB — MICROSCOPIC EXAMINATION

## 2021-02-24 LAB — MICROALBUMIN, URINE WAIVED
Creatinine, Urine Waived: 200 mg/dL (ref 10–300)
Microalb, Ur Waived: 30 mg/L — ABNORMAL HIGH (ref 0–19)
Microalb/Creat Ratio: <30 mg/g (ref <30)

## 2021-02-24 LAB — URINALYSIS, ROUTINE W REFLEX MICROSCOPIC
Bilirubin, UA: NEGATIVE
Glucose, UA: NEGATIVE
Ketones, UA: NEGATIVE
Leukocytes,UA: NEGATIVE
Nitrite, UA: POSITIVE — AB
Protein,UA: NEGATIVE
Specific Gravity, UA: 1.02 (ref 1.005–1.030)
Urobilinogen, Ur: 0.2 mg/dL (ref 0.2–1.0)
pH, UA: 5.5 (ref 5.0–7.5)

## 2021-02-24 LAB — BAYER DCA HB A1C WAIVED: HB A1C (BAYER DCA - WAIVED): 5.3 % (ref 4.8–5.6)

## 2021-02-24 MED ORDER — CARBAMAZEPINE 200 MG PO TABS: ORAL_TABLET | ORAL | 4 refills | Status: DC

## 2021-02-24 MED ORDER — OLANZAPINE 20 MG PO TABS: 20.0000 mg | ORAL_TABLET | Freq: Every day | ORAL | 4 refills | Status: DC

## 2021-02-24 MED ORDER — ROPINIROLE HCL 0.5 MG PO TABS: 0.5000 mg | ORAL_TABLET | Freq: Every day | ORAL | 4 refills | Status: DC

## 2021-02-24 MED ORDER — ONDANSETRON HCL 4 MG PO TABS: 4.0000 mg | ORAL_TABLET | Freq: Three times a day (TID) | ORAL | 0 refills | Status: DC | PRN

## 2021-02-24 MED ORDER — AMOXICILLIN-POT CLAVULANATE 875-125 MG PO TABS: 1.0000 | ORAL_TABLET | Freq: Two times a day (BID) | ORAL | 0 refills | Status: AC

## 2021-02-24 MED ORDER — LISINOPRIL 5 MG PO TABS: 5.0000 mg | ORAL_TABLET | Freq: Every day | ORAL | 3 refills | Status: DC

## 2021-02-24 MED ORDER — SERTRALINE HCL 50 MG PO TABS: 50.0000 mg | ORAL_TABLET | Freq: Every day | ORAL | 2 refills | Status: DC

## 2021-02-24 MED ORDER — PREGABALIN 50 MG PO CAPS
50.0000 mg | ORAL_CAPSULE | Freq: Two times a day (BID) | ORAL | 4 refills | Status: DC
Start: 2021-02-24 — End: 2021-04-10

## 2021-02-24 MED ORDER — BACLOFEN 10 MG PO TABS: 10.0000 mg | ORAL_TABLET | Freq: Three times a day (TID) | ORAL | 4 refills | Status: DC | PRN

## 2021-02-24 MED ORDER — METFORMIN HCL 500 MG PO TABS
500.0000 mg | ORAL_TABLET | Freq: Two times a day (BID) | ORAL | 4 refills | Status: DC
Start: 2021-02-24 — End: 2021-02-24

## 2021-02-24 MED ORDER — ATORVASTATIN CALCIUM 40 MG PO TABS: 40.0000 mg | ORAL_TABLET | Freq: Every day | ORAL | 4 refills | Status: DC

## 2021-02-24 NOTE — Assessment & Plan Note (Signed)
BMI 31.46.  Recommended eating smaller high protein, low fat meals more frequently and exercising 30 mins a day 5 times a week with a goal of 10-15lb weight loss in the next 3 months. Patient voiced their understanding and motivation to adhere to these recommendations.

## 2021-02-24 NOTE — Assessment & Plan Note (Signed)
Chronic, ongoing.  Continue Omeprazole daily and adjust as needed, will trial reductions in future.  Refills sent as needed.  Mag level today.

## 2021-02-24 NOTE — Assessment & Plan Note (Signed)
Chronic, ongoing.  Recommend cessation of smoking.  Monitor closely for return to tobacco use.  Will continue Advair and Albuterol, consider change to [facility] in future, does not like powder. Spirometry next visit.  Start lung CT screening at age 46.  Would benefit from discontinuation of ACE in future and switch to ARB.

## 2021-02-24 NOTE — Assessment & Plan Note (Signed)
Started with symptoms on 02/08/21 -- UA in office today noting+ NIT, 1+ BLD, Many Bacteria.  ?related to UTI.  Will start Augmentin x 7 days and recheck in 2 weeks.  Recommend increased fluid intake at home and will send in Zofran to help with nausea.  Bland diet at this time.  Check stool for infectious element due to well water at home.  Blood work obtained today.

## 2021-02-24 NOTE — Patient Instructions (Signed)
Diarrhea, Adult °Diarrhea is when you pass loose and watery poop (stool) often. Diarrhea can make you feel weak and cause you to lose water in your body (get dehydrated). Losing water in your body can cause you to: °Feel tired and thirsty. °Have a dry mouth. °Go pee (urinate) less often. °Diarrhea often lasts 2-3 days. However, it can last longer if it is a sign of something more serious. It is important to treat your diarrhea as told by your doctor. °Follow these instructions at home: °Eating and drinking °  °Follow these instructions as told by your doctor: °Take an ORS (oral rehydration solution). This is a drink that helps you replace fluids and minerals your body lost. It is sold at pharmacies and stores. °Drink plenty of fluids, such as: °Water. °Ice chips. °Diluted fruit juice. °Low-calorie sports drinks. °Milk, if you want. °Avoid drinking fluids that have a lot of sugar or caffeine in them. °Eat bland, easy-to-digest foods in small amounts as you are able. These foods include: °Bananas. °Applesauce. °Rice. °Low-fat (lean) meats. °Toast. °Crackers. °Avoid alcohol. °Avoid spicy or fatty foods. ° °Medicines °Take over-the-counter and prescription medicines only as told by your doctor. °If you were prescribed an antibiotic medicine, take it as told by your doctor. Do not stop using the antibiotic even if you start to feel better. °General instructions ° °Wash your hands often using soap and water. If soap and water are not available, use a hand sanitizer. Others in your home should wash their hands as well. Hands should be washed: °After using the toilet or changing a diaper. °Before preparing, cooking, or serving food. °While caring for a sick person. °While visiting someone in a hospital. °Drink enough fluid to keep your pee (urine) pale yellow. °Rest at home while you get better. °Take a warm bath to help with any burning or pain from having diarrhea. °Watch your condition for any changes. °Keep all  follow-up visits as told by your doctor. This is important. °Contact a doctor if: °You have a fever. °Your diarrhea gets worse. °You have new symptoms. °You cannot keep fluids down. °You feel light-headed or dizzy. °You have a headache. °You have muscle cramps. °Get help right away if: °You have chest pain. °You feel very weak or you pass out (faint). °You have bloody or black poop or poop that looks like tar. °You have very bad pain, cramping, or bloating in your belly (abdomen). °You have trouble breathing or you are breathing very quickly. °Your heart is beating very quickly. °Your skin feels cold and clammy. °You feel confused. °You have signs of losing too much water in your body, such as: °Dark pee, very little pee, or no pee. °Cracked lips. °Dry mouth. °Sunken eyes. °Sleepiness. °Weakness. °Summary °Diarrhea is when you pass loose and watery poop (stool) often. °Diarrhea can make you feel weak and cause you to lose water in your body (get dehydrated). °Take an ORS (oral rehydration solution). This is a drink that is sold at pharmacies and stores. °Eat bland, easy-to-digest foods in small amounts as you are able. °Contact a doctor if your condition gets worse. Get help right away if you have signs that you have lost too much water in your body. °This information is not intended to replace advice given to you by your health care provider. Make sure you discuss any questions you have with your health care provider. °Document Revised: 08/03/2020 Document Reviewed: 08/03/2020 °Elsevier Patient Education © 2022 [employer] ° °

## 2021-02-24 NOTE — Assessment & Plan Note (Signed)
Chronic, ongoing.  BP well below goal today.  Will decrease Lisinopril to 5 MG daily, kidney protection in diabetes, refills sent.  Urine ALB 30 (January 2023).  A1C 5.3% today.  Recommend she monitor BP at home daily and document + bring to visits.  Focus on DASH diet.

## 2021-02-24 NOTE — Assessment & Plan Note (Signed)
Chronic, with no benefit from Gabapentin. At this time increase Lyrica to 50 MG BID dosing and Requip to 0.5 MG QHS.  Reviewed with patient and educated her on these changes.  Will plan to adjust further as needed next visit.  Continue B12 daily.  Mag level today.
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
## 2021-02-24 NOTE — Assessment & Plan Note (Signed)
Continues to remain sustained from cocaine use, recommend continued cessation.
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
## 2021-02-24 NOTE — Assessment & Plan Note (Signed)
Ongoing.  Continue Vitamin B12 supplement daily.  Recommend she take this daily, no missing doses.  Check CBC and B12 today.

## 2021-02-24 NOTE — Assessment & Plan Note (Signed)
UA in office today noting+ NIT, 1+ BLD, Many Bacteria.  ?related to UTI.  Will start Augmentin x 7 days and recheck in 2 weeks.  Recommend increased fluid intake at home and will send in Zofran to help with nausea.

## 2021-02-24 NOTE — Assessment & Plan Note (Signed)
I have recommended complete cessation of tobacco use. I have discussed various options available for assistance with tobacco cessation including over the counter methods (Nicotine gum, patch and lozenges). We also discussed prescription options (Chantix, Nicotine Inhaler / Nasal Spray). The patient is not interested in pursuing any prescription tobacco cessation options at this time.  

## 2021-02-24 NOTE — Assessment & Plan Note (Signed)
Chronic, ongoing.  A1C recent 5.3%.  Urine ALB 30 today (January 2023).  Will stop Metformin and continue Trulicity at this time which will benefit both diabetes and weight management.  Recommend she monitor BS at home every morning, fasting, and document for provider + bring to visits.  Focus on diabetic diet.
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
## 2021-02-24 NOTE — Assessment & Plan Note (Signed)
Chronic, ongoing.  Continue Atorvastatin 40 MG daily and adjust as needed, refills sent.  Lipid panel panel today.

## 2021-02-25 LAB — COMPREHENSIVE METABOLIC PANEL
ALT: 19 IU/L (ref 0–32)
AST: 17 IU/L (ref 0–40)
Albumin/Globulin Ratio: 2.1 (ref 1.2–2.2)
Albumin: 4.2 g/dL (ref 3.8–4.8)
Alkaline Phosphatase: 107 IU/L (ref 44–121)
BUN/Creatinine Ratio: 8 — ABNORMAL LOW (ref 9–23)
BUN: 6 mg/dL (ref 6–24)
Bilirubin Total: <0.2 mg/dL (ref 0.0–1.2)
CO2: 24 mmol/L (ref 20–29)
Calcium: 9.3 mg/dL (ref 8.7–10.2)
Chloride: 101 mmol/L (ref 96–106)
Creatinine, Ser: 0.76 mg/dL (ref 0.57–1.00)
Globulin, Total: 2 g/dL (ref 1.5–4.5)
Glucose: 101 mg/dL — ABNORMAL HIGH (ref 70–99)
Potassium: 3.7 mmol/L (ref 3.5–5.2)
Sodium: 140 mmol/L (ref 134–144)
Total Protein: 6.2 g/dL (ref 6.0–8.5)
eGFR: 97 mL/min/{1.73_m2} (ref >59)

## 2021-02-25 LAB — CBC WITH DIFFERENTIAL/PLATELET
Basophils Absolute: 0.2 10*3/uL (ref 0.0–0.2)
Basos: 2 %
EOS (ABSOLUTE): 0.3 10*3/uL (ref 0.0–0.4)
Eos: 3 %
Hematocrit: 46.4 % (ref 34.0–46.6)
Hemoglobin: 15.4 g/dL (ref 11.1–15.9)
Immature Grans (Abs): 0 10*3/uL (ref 0.0–0.1)
Immature Granulocytes: 0 %
Lymphocytes Absolute: 2.8 10*3/uL (ref 0.7–3.1)
Lymphs: 28 %
MCH: 29.1 pg (ref 26.6–33.0)
MCHC: 33.2 g/dL (ref 31.5–35.7)
MCV: 88 fL (ref 79–97)
Monocytes Absolute: 0.6 10*3/uL (ref 0.1–0.9)
Monocytes: 6 %
Neutrophils Absolute: 6 10*3/uL (ref 1.4–7.0)
Neutrophils: 61 %
Platelets: 330 10*3/uL (ref 150–450)
RBC: 5.3 x10E6/uL — ABNORMAL HIGH (ref 3.77–5.28)
RDW: 13.9 % (ref 11.7–15.4)
WBC: 9.8 10*3/uL (ref 3.4–10.8)

## 2021-02-25 LAB — LIPID PANEL W/O CHOL/HDL RATIO
Cholesterol, Total: 132 mg/dL (ref 100–199)
HDL: 35 mg/dL — ABNORMAL LOW (ref >39)
LDL Chol Calc (NIH): 61 mg/dL (ref 0–99)
Triglycerides: 217 mg/dL — ABNORMAL HIGH (ref 0–149)
VLDL Cholesterol Cal: 36 mg/dL (ref 5–40)

## 2021-02-25 LAB — VITAMIN B12: Vitamin B-12: >2000 pg/mL — ABNORMAL HIGH (ref 232–1245)

## 2021-02-25 LAB — CARBAMAZEPINE LEVEL, TOTAL: Carbamazepine (Tegretol), S: 6.4 ug/mL (ref 4.0–12.0)

## 2021-02-25 LAB — MAGNESIUM: Magnesium: 1.9 mg/dL (ref 1.6–2.3)

## 2021-02-25 LAB — TSH: TSH: 0.699 u[IU]/mL (ref 0.450–4.500)

## 2021-02-25 NOTE — Progress Notes (Signed)
Contacted via MyChart   Good morning Vanessa Baker, your labs have returned and overall are remaining stable with current medication regimen.  B12 level is now above goal, I would recommend cutting back on Vitamin B12 to 1000 MCG every other day.  Any questions? Keep being awesome!!  Thank you for allowing me to participate in your care.  I appreciate you. Kindest regards, Karna Abed

## 2021-02-27 LAB — URINE CULTURE

## 2021-02-27 NOTE — Progress Notes (Signed)
Contacted via MyChart   Good afternoon Vanessa Baker, your urine testing did return showing infection and this is susceptible to the Augmentin I sent in, please complete this course.  Any questions?

## 2021-02-28 ENCOUNTER — Telehealth: Payer: Self-pay

## 2021-02-28 NOTE — Telephone Encounter (Signed)
CALLED PATIENT NO ANSWER LEFT VOICEMAIL FOR A CALL BACK LETTER SENT 

## 2021-03-05 ENCOUNTER — Encounter: Payer: Self-pay | Admitting: Nurse Practitioner

## 2021-03-06 ENCOUNTER — Encounter: Payer: Self-pay | Admitting: Nurse Practitioner

## 2021-03-06 DIAGNOSIS — C3400 Malignant neoplasm of unspecified main bronchus: Secondary | ICD-10-CM | POA: Diagnosis not present

## 2021-03-06 DIAGNOSIS — J4541 Moderate persistent asthma with (acute) exacerbation: Secondary | ICD-10-CM | POA: Diagnosis not present

## 2021-03-06 DIAGNOSIS — J9601 Acute respiratory failure with hypoxia: Secondary | ICD-10-CM | POA: Diagnosis not present

## 2021-03-07 DIAGNOSIS — R197 Diarrhea, unspecified: Secondary | ICD-10-CM | POA: Diagnosis not present

## 2021-03-10 ENCOUNTER — Encounter: Payer: Self-pay | Admitting: Nurse Practitioner

## 2021-03-11 LAB — CDIFF NAA+O+P+STOOL CULTURE: Toxigenic C. Difficile by PCR: NEGATIVE

## 2021-03-11 NOTE — Progress Notes (Signed)
Contacted via MyChart   Good morning Vanessa Baker, how are you feeling?  Stool testing for c difficile and ova/parasite is all negative.  Remainder of stool testing will need repeat due to container sent with you.  Any questions?

## 2021-03-19 NOTE — Patient Instructions (Incomplete)

## 2021-03-20 ENCOUNTER — Ambulatory Visit: Payer: Medicare HMO | Admitting: Nurse Practitioner

## 2021-03-20 DIAGNOSIS — N3001 Acute cystitis with hematuria: Secondary | ICD-10-CM

## 2021-03-21 ENCOUNTER — Other Ambulatory Visit: Payer: Self-pay

## 2021-03-21 ENCOUNTER — Other Ambulatory Visit: Payer: Self-pay | Admitting: Nurse Practitioner

## 2021-03-21 ENCOUNTER — Encounter: Payer: Self-pay | Admitting: Nurse Practitioner

## 2021-03-21 ENCOUNTER — Ambulatory Visit (INDEPENDENT_AMBULATORY_CARE_PROVIDER_SITE_OTHER): Payer: Medicare HMO | Admitting: Nurse Practitioner

## 2021-03-21 VITALS — BP 110/70 | HR 72 | Temp 98.5°F | Ht 62.0 in | Wt 171.0 lb

## 2021-03-21 DIAGNOSIS — Z599 Problem related to housing and economic circumstances, unspecified: Secondary | ICD-10-CM | POA: Diagnosis not present

## 2021-03-21 DIAGNOSIS — N3001 Acute cystitis with hematuria: Secondary | ICD-10-CM | POA: Diagnosis not present

## 2021-03-21 DIAGNOSIS — F3162 Bipolar disorder, current episode mixed, moderate: Secondary | ICD-10-CM | POA: Diagnosis not present

## 2021-03-21 DIAGNOSIS — R197 Diarrhea, unspecified: Secondary | ICD-10-CM | POA: Diagnosis not present

## 2021-03-21 DIAGNOSIS — R69 Illness, unspecified: Secondary | ICD-10-CM | POA: Diagnosis not present

## 2021-03-21 LAB — URINALYSIS, ROUTINE W REFLEX MICROSCOPIC
Bilirubin, UA: NEGATIVE
Glucose, UA: NEGATIVE
Ketones, UA: NEGATIVE
Leukocytes,UA: NEGATIVE
Nitrite, UA: NEGATIVE
Protein,UA: NEGATIVE
RBC, UA: NEGATIVE
Specific Gravity, UA: 1.015 (ref 1.005–1.030)
Urobilinogen, Ur: 0.2 mg/dL (ref 0.2–1.0)
pH, UA: 6 (ref 5.0–7.5)

## 2021-03-21 NOTE — Progress Notes (Signed)
Contacted via MyChart   Urine results returned negative this check.

## 2021-03-21 NOTE — Assessment & Plan Note (Signed)
New CCM referral placed.

## 2021-03-21 NOTE — Assessment & Plan Note (Signed)
Acute and improving some -- ?related to stopping Pregabalin and Olanzapine months back -- she has had less appetite, mood changes.  Will repeat stool culture.  Continue off Metformin and Trulicity, sugars improving.  Plan for return in 2 weeks, once she has restarted medication.  If ongoing then consider imaging and referral to GI.

## 2021-03-21 NOTE — Assessment & Plan Note (Signed)
Noted recent visit, recheck urine today as has completed all treatment.
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
## 2021-03-21 NOTE — Patient Instructions (Signed)
Diarrhea, Adult °Diarrhea is when you pass loose and watery poop (stool) often. Diarrhea can make you feel weak and cause you to lose water in your body (get dehydrated). Losing water in your body can cause you to: °Feel tired and thirsty. °Have a dry mouth. °Go pee (urinate) less often. °Diarrhea often lasts 2-3 days. However, it can last longer if it is a sign of something more serious. It is important to treat your diarrhea as told by your doctor. °Follow these instructions at home: °Eating and drinking °  °Follow these instructions as told by your doctor: °Take an ORS (oral rehydration solution). This is a drink that helps you replace fluids and minerals your body lost. It is sold at pharmacies and stores. °Drink plenty of fluids, such as: °Water. °Ice chips. °Diluted fruit juice. °Low-calorie sports drinks. °Milk, if you want. °Avoid drinking fluids that have a lot of sugar or caffeine in them. °Eat bland, easy-to-digest foods in small amounts as you are able. These foods include: °Bananas. °Applesauce. °Rice. °Low-fat (lean) meats. °Toast. °Crackers. °Avoid alcohol. °Avoid spicy or fatty foods. ° °Medicines °Take over-the-counter and prescription medicines only as told by your doctor. °If you were prescribed an antibiotic medicine, take it as told by your doctor. Do not stop using the antibiotic even if you start to feel better. °General instructions ° °Wash your hands often using soap and water. If soap and water are not available, use a hand sanitizer. Others in your home should wash their hands as well. Hands should be washed: °After using the toilet or changing a diaper. °Before preparing, cooking, or serving food. °While caring for a sick person. °While visiting someone in a hospital. °Drink enough fluid to keep your pee (urine) pale yellow. °Rest at home while you get better. °Take a warm bath to help with any burning or pain from having diarrhea. °Watch your condition for any changes. °Keep all  follow-up visits as told by your doctor. This is important. °Contact a doctor if: °You have a fever. °Your diarrhea gets worse. °You have new symptoms. °You cannot keep fluids down. °You feel light-headed or dizzy. °You have a headache. °You have muscle cramps. °Get help right away if: °You have chest pain. °You feel very weak or you pass out (faint). °You have bloody or black poop or poop that looks like tar. °You have very bad pain, cramping, or bloating in your belly (abdomen). °You have trouble breathing or you are breathing very quickly. °Your heart is beating very quickly. °Your skin feels cold and clammy. °You feel confused. °You have signs of losing too much water in your body, such as: °Dark pee, very little pee, or no pee. °Cracked lips. °Dry mouth. °Sunken eyes. °Sleepiness. °Weakness. °Summary °Diarrhea is when you pass loose and watery poop (stool) often. °Diarrhea can make you feel weak and cause you to lose water in your body (get dehydrated). °Take an ORS (oral rehydration solution). This is a drink that is sold at pharmacies and stores. °Eat bland, easy-to-digest foods in small amounts as you are able. °Contact a doctor if your condition gets worse. Get help right away if you have signs that you have lost too much water in your body. °This information is not intended to replace advice given to you by your health care provider. Make sure you discuss any questions you have with your health care provider. °Document Revised: 08/03/2020 Document Reviewed: 08/03/2020 °Elsevier Patient Education © 2022 [employer] ° °
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
## 2021-03-22 DIAGNOSIS — R197 Diarrhea, unspecified: Secondary | ICD-10-CM | POA: Diagnosis not present

## 2021-03-22 NOTE — Telephone Encounter (Signed)
Requested medications are due for refill today.  no  Requested medications are on the active medications list.  yes  Last refill. 02/24/2021 #180 4 refills  Future visit scheduled.   yes  Notes to clinic.  Medication not delegated.    Requested Prescriptions  Pending Prescriptions Disp Refills   pregabalin (LYRICA) 50 MG capsule [Pharmacy Med Name: Pregabalin 50 MG Oral Capsule] 30 capsule 0    Sig: Take 1 capsule by mouth at bedtime     Not Delegated - Neurology:  Anticonvulsants - Controlled - pregabalin Failed - 03/21/2021  4:21 PM      Failed - This refill cannot be delegated      Passed - Cr in normal range and within 360 days    Creat  Date Value Ref Range Status  12/19/2018 0.68 0.50 - 1.10 mg/dL Final   Creatinine, Ser  Date Value Ref Range Status  02/24/2021 0.76 0.57 - 1.00 mg/dL Final          Passed - Completed PHQ-2 or PHQ-9 in the last 360 days      Passed - Valid encounter within last 12 months    Recent Outpatient Visits           Yesterday Bipolar 1 disorder, mixed, moderate ([facility])   [place], Jolene T, NP   3 weeks ago Type 2 diabetes mellitus with obesity ([place])   [place], Etna T, NP   4 months ago Restless leg syndrome   [place], Granger T, NP   6 months ago Foot pain, right   [employer], [place] T, NP   7 months ago Type 2 diabetes mellitus with obesity ([facility])   [place], Barbaraann Faster, NP       Future Appointments             In 2 weeks Cannady, Barbaraann Faster, NP [employer], [facility]

## 2021-03-24 ENCOUNTER — Telehealth: Payer: Self-pay

## 2021-03-24 NOTE — Chronic Care Management (AMB) (Signed)
°  Chronic Care Management   Outreach Note  03/24/2021 Name: Vanessa Baker MRN: [id number] DOB: 12/30/1975  Vanessa Baker is a 46 y.o. year old female who is a primary care patient of Cannady, Barbaraann Faster, NP. I reached out to [employer] by phone today in response to a referral sent by Vanessa Baker's primary care provider.  An unsuccessful telephone outreach was attempted today. The patient was referred to the case management team for assistance with care management and care coordination.   Follow Up Plan: A HIPAA compliant phone message was left for the patient providing contact information and requesting a return call.  The care management team will reach out to the patient again over the next 5 days.  If patient returns call to provider office, please advise to call [facility] * at [phone]*  Noreene Larsson, Vanessa Baker, Vanessa Baker Management  Vanessa Baker, Vanessa Baker [address] Direct Dial: [phone] [email or web address]: [email or web address]

## 2021-03-25 NOTE — Progress Notes (Signed)
Contacted via MyChart  Good morning Vanessa Baker, so far stool culture is negative -- waiting one more to result:)

## 2021-03-29 LAB — CDIFF NAA+O+P+STOOL CULTURE
E coli, Shiga toxin Assay: NEGATIVE
Toxigenic C. Difficile by PCR: NEGATIVE

## 2021-04-03 NOTE — Chronic Care Management (AMB) (Signed)
°  Chronic Care Management   Outreach Note  04/03/2021 Name: Vanessa Baker MRN: [id number] DOB: 05/22/1975  Vanessa Baker is a 46 y.o. year old female who is a primary care patient of Cannady, Barbaraann Faster, NP. I reached out to [employer] by phone today in response to a referral sent by Vanessa Baker's primary care provider.  A second unsuccessful telephone outreach was attempted today. The patient was referred to the case management team for assistance with care management and care coordination.   Follow Up Plan: A HIPAA compliant phone message was left for the patient providing contact information and requesting a return call.  The care management team will reach out to the patient again over the next 7 days.  If patient returns call to provider office, please advise to call [place] * at [phone]*  Noreene Larsson, [place], [place] Management  [place], [place] [address] Direct Dial: [phone] [email or web address]: [email or web address]

## 2021-04-04 DIAGNOSIS — J4541 Moderate persistent asthma with (acute) exacerbation: Secondary | ICD-10-CM | POA: Diagnosis not present

## 2021-04-04 DIAGNOSIS — J9601 Acute respiratory failure with hypoxia: Secondary | ICD-10-CM | POA: Diagnosis not present

## 2021-04-04 DIAGNOSIS — C3400 Malignant neoplasm of unspecified main bronchus: Secondary | ICD-10-CM | POA: Diagnosis not present

## 2021-04-05 ENCOUNTER — Telehealth: Payer: Self-pay

## 2021-04-05 NOTE — Telephone Encounter (Signed)
? ?  Telephone encounter was:  Unsuccessful.  04/05/2021 ?Name: Vanessa Baker MRN: [id number] DOB: [birth year] ? ?Unsuccessful outbound call made today to assist with:  Financial Difficulties related to utilities ? ?Outreach Attempt:  1st Attempt ? ?A HIPAA compliant voice message was left requesting a return call.  Instructed patient to call back at [phone]. ? ?Dejane Scheibe, [place], CHC ?Care Guide  Embedded Care Coordination ?[facility]  [facility]  ?[address] ?[place], [place] [address] ?[email or web address]  ?? [phone]   ?[email or web address] ?  ?

## 2021-04-05 NOTE — Addendum Note (Signed)
Addended by: Marnee Guarneri T on: 04/05/2021 01:33 PM   Modules accepted: Orders

## 2021-04-05 NOTE — Chronic Care Management (AMB) (Signed)
?  Chronic Care Management  ? ?Note ? ?04/05/2021 ?Name: Vanessa Baker MRN: [id number] DOB: [birth year] ? ?[name] is a 46 y.o. year old female who is a primary care patient of Cannady, Barbaraann Faster, NP. I reached out to [employer] by phone today in response to a referral sent by [name]'s PCP. ? ?[name] was given information about Chronic Care Management services today including:  ?CCM service includes personalized support from designated clinical staff supervised by her physician, including individualized plan of care and coordination with other care providers ?24/7 contact phone numbers for assistance for urgent and routine care needs. ?Service will only be billed when office clinical staff spend 20 minutes or more in a month to coordinate care. ?Only one practitioner may furnish and bill the service in a calendar month. ?The patient may stop CCM services at any time (effective at the end of the month) by phone call to the office staff. ?The patient is responsible for co-pay (up to 20% after annual deductible is met) if co-pay is required by the individual health plan.  ? ?Patient did not agree to enrollment in care management services and does not wish to consider at this time. ? ?Follow up plan: ?Patient declines further follow up and engagement by the care management team. Appropriate care team members and provider have been notified via electronic communication.  ? ?Noreene Larsson, RMA ?Care Guide, Embedded Care Coordination ?[facility]  Care Management  ?[place], [place] [address] ?Direct Dial: [phone] ?[name] ?Website: [email or web address]  ? ?

## 2021-04-07 ENCOUNTER — Telehealth: Payer: Self-pay

## 2021-04-07 NOTE — Telephone Encounter (Signed)
? ?  Telephone encounter was:  Successful.  ?04/07/2021 ?Name: Zadaya Cuadra MRN: [id number] DOB: [birth year] ? ?Valrie Maryori Weide is a 46 y.o. year old female who is a primary care patient of Cannady, Barbaraann Faster, NP . The community resource team was consulted for assistance with Financial Difficulties related to utilities ? ?Care guide performed the following interventions: Received email confirming receipt from patient. ? ?Follow Up Plan:  No further follow up planned at this time. The patient has been provided with needed resources. ? ?Manessa Buley, [place], CHC ?Care Guide  Embedded Care Coordination ?[facility]  [facility]  ?[address] ?[place], [place] [address] ?[email or web address]  ?? [phone]   ?[email or web address] ?  ?

## 2021-04-07 NOTE — Telephone Encounter (Signed)
? ?  Telephone encounter was:  Successful.  ?04/07/2021 ?Name: Vanessa Baker MRN: [id number] DOB: November 07, 1975 ? ?Vanessa Baker is a 46 y.o. year old female who is a primary care patient of Cannady, Barbaraann Faster, NP . The community resource team was consulted for assistance with Financial Difficulties related to utilities ? ?Care guide performed the following interventions: Spoke with patient verified email address [email or web address].   Sent information for [place], Solic[employer] and [employer]. ? ?Follow Up Plan:  Care guide will follow up with patient by phone over the next 7-10 days. ? ?Simeon Vera, [place], CHC ?Care Guide  Embedded Care Coordination ?[facility]  [facility] Management  ?[address] ?[place], [place] [address] ?[email or web address]  ?? [phone]   ?[email or web address] ?  ?

## 2021-04-10 ENCOUNTER — Ambulatory Visit (INDEPENDENT_AMBULATORY_CARE_PROVIDER_SITE_OTHER): Payer: Medicare HMO | Admitting: Nurse Practitioner

## 2021-04-10 ENCOUNTER — Other Ambulatory Visit: Payer: Self-pay

## 2021-04-10 ENCOUNTER — Encounter: Payer: Self-pay | Admitting: Nurse Practitioner

## 2021-04-10 VITALS — BP 117/72 | HR 92 | Ht 62.0 in | Wt 179.6 lb

## 2021-04-10 DIAGNOSIS — F3162 Bipolar disorder, current episode mixed, moderate: Secondary | ICD-10-CM | POA: Diagnosis not present

## 2021-04-10 DIAGNOSIS — G2581 Restless legs syndrome: Secondary | ICD-10-CM | POA: Diagnosis not present

## 2021-04-10 DIAGNOSIS — F4310 Post-traumatic stress disorder, unspecified: Secondary | ICD-10-CM | POA: Diagnosis not present

## 2021-04-10 DIAGNOSIS — R69 Illness, unspecified: Secondary | ICD-10-CM | POA: Diagnosis not present

## 2021-04-10 DIAGNOSIS — R197 Diarrhea, unspecified: Secondary | ICD-10-CM

## 2021-04-10 MED ORDER — ROPINIROLE HCL ER 2 MG PO TB24: 2.0000 mg | ORAL_TABLET | Freq: Every day | ORAL | 4 refills | Status: DC

## 2021-04-10 MED ORDER — TRAZODONE HCL 100 MG PO TABS: 200.0000 mg | ORAL_TABLET | Freq: Every day | ORAL | 4 refills | Status: DC

## 2021-04-10 MED ORDER — PREGABALIN 200 MG PO CAPS: 200.0000 mg | ORAL_CAPSULE | Freq: Every evening | ORAL | 1 refills | Status: DC
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
## 2021-04-10 NOTE — Patient Instructions (Signed)
Change your Lyrica 50 MG to taking 4 tablets at night and then start Requip 1 MG for restless leg syndrome -- have sent this in.  Try taking Melatonin 10 MG at bedtime. ? ?Restless Legs Syndrome ?Restless legs syndrome is a condition that causes uncomfortable feelings or sensations in the legs, especially while sitting or lying down. The sensations usually cause an overwhelming urge to move the legs. The arms can also sometimes be affected. ?The condition can range from mild to severe. The symptoms often interfere with a person's ability to sleep. ?What are the causes? ?The cause of this condition is not known. ?What increases the risk? ?The following factors may make you more likely to develop this condition: ?Being older than 50. ?Pregnancy. ?Being a woman. In general, the condition is more common in women than in men. ?A family history of the condition. ?Having iron deficiency. ?Overuse of caffeine, nicotine, or alcohol. ?Certain medical conditions, such as kidney disease, Parkinson's disease, or nerve damage. ?Certain medicines, such as those for high blood pressure, nausea, colds, allergies, depression, and some heart conditions. ?What are the signs or symptoms? ?The main symptom of this condition is uncomfortable sensations in the legs, such as: ?Pulling. ?Tingling. ?Prickling. ?Throbbing. ?Crawling. ?Burning. ?Usually, the sensations: ?Affect both sides of the body. ?Are worse when you sit or lie down. ?Are worse at night. These may make it difficult to fall asleep. ?Make you have a strong urge to move your legs. ?Are temporarily relieved by moving your legs or standing. ?The arms can also be affected, but this is rare. People who have this condition often have tiredness during the day because of their lack of sleep at night. ?How is this diagnosed? ?This condition may be diagnosed based on: ?Your symptoms. ?Blood tests. ?In some cases, you may be monitored in a sleep lab by a specialist (a sleep study). This  can detect any disruptions in your sleep. ?How is this treated? ?This condition is treated by managing the symptoms. This may include: ?Lifestyle changes, such as exercising, using relaxation techniques, and avoiding caffeine, alcohol, or tobacco. ?Iron supplements. ?Medicines. Parkinson's medications may be tried first. Anti-seizure medications can also be helpful. ?Follow these instructions at home: ?General instructions ?Take over-the-counter and prescription medicines only as told by your health care provider. ?Use methods to help relieve the uncomfortable sensations, such as: ?Massaging your legs. ?Walking or stretching. ?Taking a cold or hot bath. ?Keep all follow-up visits. This is important. ?Lifestyle ?  ?Practice good sleep habits. For example, go to bed and get up at the same time every day. Most adults should get 7-9 hours of sleep each night. ?Exercise regularly. Try to get at least 30 minutes of exercise most days of the week. ?Practice ways of relaxing, such as yoga or meditation. ?Avoid caffeine and alcohol. ?Do not use any products that contain nicotine or tobacco. These products include cigarettes, chewing tobacco, and vaping devices, such as e-cigarettes. If you need help quitting, ask your health care provider. ?Where to find more information ?[employer] of Neurological Disorders and Stroke: MasterBoxes.it ?Contact a health care provider if: ?Your symptoms get worse or they do not improve with treatment. ?Summary ?Restless legs syndrome is a condition that causes uncomfortable feelings or sensations in the legs, especially while sitting or lying down. ?The symptoms often interfere with your ability to sleep. ?This condition is treated by managing the symptoms. You may need to make lifestyle changes or take medicines. ?This information is not intended  to replace advice given to you by your health care provider. Make sure you discuss any questions you have with your health care  provider. ?Document Revised: 09/04/2020 Document Reviewed: 09/04/2020 ?Elsevier Patient Education ? [place]. ? ?

## 2021-04-10 NOTE — Assessment & Plan Note (Signed)
Improved at this time with restarting of her psychiatry medications. ?

## 2021-04-10 NOTE — Assessment & Plan Note (Signed)
Chronic, with no benefit from Gabapentin. At this time increase Lyrica to 200 MG QHS dosing and Requip to 1 MG QHS.  Reviewed with patient and educated her on these changes.  Will plan to adjust further as needed next visit.  Continue B12 daily.  Referral to neurology for further discussion.   ?
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
## 2021-04-21 ENCOUNTER — Encounter: Payer: Self-pay | Admitting: Nurse Practitioner

## 2021-05-01 ENCOUNTER — Encounter: Payer: Self-pay | Admitting: Nurse Practitioner

## 2021-05-04 DIAGNOSIS — J9601 Acute respiratory failure with hypoxia: Secondary | ICD-10-CM | POA: Diagnosis not present

## 2021-05-04 DIAGNOSIS — C3400 Malignant neoplasm of unspecified main bronchus: Secondary | ICD-10-CM | POA: Diagnosis not present

## 2021-05-04 DIAGNOSIS — J4541 Moderate persistent asthma with (acute) exacerbation: Secondary | ICD-10-CM | POA: Diagnosis not present

## 2021-05-10 MED ORDER — PREGABALIN 200 MG PO CAPS: 200.0000 mg | ORAL_CAPSULE | Freq: Every evening | ORAL | 1 refills | Status: DC

## 2021-05-10 NOTE — Addendum Note (Signed)
Addended by: Marnee Guarneri T on: 05/10/2021 01:09 PM ? ? Modules accepted: Orders ? ?

## 2021-05-26 ENCOUNTER — Ambulatory Visit (INDEPENDENT_AMBULATORY_CARE_PROVIDER_SITE_OTHER): Payer: Medicare HMO | Admitting: Nurse Practitioner

## 2021-05-26 ENCOUNTER — Encounter: Payer: Self-pay | Admitting: Nurse Practitioner

## 2021-05-26 VITALS — BP 108/58 | HR 84 | Temp 98.4°F | Ht 62.0 in | Wt 178.8 lb

## 2021-05-26 DIAGNOSIS — E669 Obesity, unspecified: Secondary | ICD-10-CM

## 2021-05-26 DIAGNOSIS — F4310 Post-traumatic stress disorder, unspecified: Secondary | ICD-10-CM

## 2021-05-26 DIAGNOSIS — G2111 Neuroleptic induced parkinsonism: Secondary | ICD-10-CM

## 2021-05-26 DIAGNOSIS — J4540 Moderate persistent asthma, uncomplicated: Secondary | ICD-10-CM | POA: Diagnosis not present

## 2021-05-26 DIAGNOSIS — F3162 Bipolar disorder, current episode mixed, moderate: Secondary | ICD-10-CM | POA: Diagnosis not present

## 2021-05-26 DIAGNOSIS — E66811 Obesity, class 1: Secondary | ICD-10-CM

## 2021-05-26 DIAGNOSIS — E785 Hyperlipidemia, unspecified: Secondary | ICD-10-CM | POA: Diagnosis not present

## 2021-05-26 DIAGNOSIS — I152 Hypertension secondary to endocrine disorders: Secondary | ICD-10-CM | POA: Diagnosis not present

## 2021-05-26 DIAGNOSIS — E1159 Type 2 diabetes mellitus with other circulatory complications: Secondary | ICD-10-CM

## 2021-05-26 DIAGNOSIS — E538 Deficiency of other specified B group vitamins: Secondary | ICD-10-CM | POA: Diagnosis not present

## 2021-05-26 DIAGNOSIS — E6609 Other obesity due to excess calories: Secondary | ICD-10-CM

## 2021-05-26 DIAGNOSIS — G2581 Restless legs syndrome: Secondary | ICD-10-CM

## 2021-05-26 DIAGNOSIS — F5101 Primary insomnia: Secondary | ICD-10-CM

## 2021-05-26 DIAGNOSIS — R69 Illness, unspecified: Secondary | ICD-10-CM | POA: Diagnosis not present

## 2021-05-26 DIAGNOSIS — Z6832 Body mass index (BMI) 32.0-32.9, adult: Secondary | ICD-10-CM

## 2021-05-26 DIAGNOSIS — F17210 Nicotine dependence, cigarettes, uncomplicated: Secondary | ICD-10-CM

## 2021-05-26 DIAGNOSIS — E1169 Type 2 diabetes mellitus with other specified complication: Secondary | ICD-10-CM | POA: Diagnosis not present

## 2021-05-26 DIAGNOSIS — T43505A Adverse effect of unspecified antipsychotics and neuroleptics, initial encounter: Secondary | ICD-10-CM

## 2021-05-26 LAB — BAYER DCA HB A1C WAIVED: HB A1C (BAYER DCA - WAIVED): 5.8 % — ABNORMAL HIGH (ref 4.8–5.6)

## 2021-05-26 MED ORDER — ROPINIROLE HCL ER 2 MG PO TB24: 2.0000 mg | ORAL_TABLET | Freq: Every day | ORAL | 4 refills | Status: DC

## 2021-05-26 NOTE — Assessment & Plan Note (Signed)
Chronic, with no benefit from Gabapentin. At this time continue Lyrica 200 MG QHS dosing and Requip 2 MG QHS.  Reviewed with patient and educated her on these changes.  Will plan to adjust further as needed next visit.  Continue B12 daily.  Referral to neurology for further discussion placed last visit.   ?

## 2021-05-26 NOTE — Assessment & Plan Note (Signed)
Chronic, ongoing.  BP well below goal today.  Will stop Lisinopril at this time and monitor closely, consider restart in future at 2.5 MG for kidney protection if needed.  Urine ALB 30 (January 2023).  A1c 5.8% today.  Recommend she monitor BP at home daily and document + bring to visits.  Focus on DASH diet.   ?

## 2021-05-26 NOTE — Assessment & Plan Note (Signed)
Chronic, ongoing.  A1c 5.8% today.  Urine ALB 30 today (January 2023).  Will continue off medication at this time.  Recommend she monitor BS at home every morning, fasting, and document for provider + bring to visits.  Focus on diabetic diet.   ?

## 2021-05-26 NOTE — Assessment & Plan Note (Addendum)
Chronic, ongoing.  Continue Atorvastatin 40 MG daily and adjust as needed, refills sent.  Lipid panel up to date. ?

## 2021-05-26 NOTE — Assessment & Plan Note (Signed)
I have recommended complete cessation of tobacco use. I have discussed various options available for assistance with tobacco cessation including over the counter methods (Nicotine gum, patch and lozenges). We also discussed prescription options (Chantix, Nicotine Inhaler / Nasal Spray). The patient is not interested in pursuing any prescription tobacco cessation options at this time.  

## 2021-05-26 NOTE — Assessment & Plan Note (Signed)
Chronic, stable, improved.  Continue collaboration with neurology as needed.  Continue current medication regimen. ?

## 2021-05-26 NOTE — Assessment & Plan Note (Signed)
Chronic, ongoing.  Recommend cessation of smoking.  Monitor closely for return to tobacco use.  Will continue Advair and Albuterol, consider change to [facility] in future, does not like powder. Spirometry next visit.  Start lung CT screening at age 46.  Would benefit from discontinuation of ACE in future and switch to ARB. ?
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
## 2021-05-26 NOTE — Patient Instructions (Addendum)
Goal is less then 130 fasting in morning and less then 180 two hours after meal. ? ?Stop Lisinopril at this time, if Blood Pressure is consistently >130/90 then restart at 1/2 tablet. ? ?Diabetes Mellitus and Nutrition, Adult ?When you have diabetes, or diabetes mellitus, it is very important to have healthy eating habits because your blood sugar (glucose) levels are greatly affected by what you eat and drink. Eating healthy foods in the right amounts, at about the same times every day, can help you: ?Manage your blood glucose. ?Lower your risk of heart disease. ?Improve your blood pressure. ?Reach or maintain a healthy weight. ?What can affect my meal plan? ?Every person with diabetes is different, and each person has different needs for a meal plan. Your health care provider may recommend that you work with a dietitian to make a meal plan that is best for you. Your meal plan may vary depending on factors such as: ?The calories you need. ?The medicines you take. ?Your weight. ?Your blood glucose, blood pressure, and cholesterol levels. ?Your activity level. ?Other health conditions you have, such as heart or kidney disease. ?How do carbohydrates affect me? ?Carbohydrates, also called carbs, affect your blood glucose level more than any other type of food. Eating carbs raises the amount of glucose in your blood. ?It is important to know how many carbs you can safely have in each meal. This is different for every person. Your dietitian can help you calculate how many carbs you should have at each meal and for each snack. ?How does alcohol affect me? ?Alcohol can cause a decrease in blood glucose (hypoglycemia), especially if you use insulin or take certain diabetes medicines by mouth. Hypoglycemia can be a life-threatening condition. Symptoms of hypoglycemia, such as sleepiness, dizziness, and confusion, are similar to symptoms of having too much alcohol. ?Do not drink alcohol if: ?Your health care provider tells you  not to drink. ?You are pregnant, may be pregnant, or are planning to become pregnant. ?If you drink alcohol: ?Limit how much you have to: ?0-1 drink a day for women. ?0-2 drinks a day for men. ?Know how much alcohol is in your drink. In the [place] one drink equals one 12 oz bottle of beer (355 mL), one 5 oz glass of wine (148 mL), or one 1? oz glass of hard liquor (44 mL). ?Keep yourself hydrated with water, diet soda, or unsweetened iced tea. Keep in mind that regular soda, juice, and other mixers may contain a lot of sugar and must be counted as carbs. ?What are tips for following this plan? ? ?Reading food labels ?Start by checking the serving size on the Nutrition Facts label of packaged foods and drinks. The number of calories and the amount of carbs, fats, and other nutrients listed on the label are based on one serving of the item. Many items contain more than one serving per package. ?Check the total grams (g) of carbs in one serving. ?Check the number of grams of saturated fats and trans fats in one serving. Choose foods that have a low amount or none of these fats. ?Check the number of milligrams (mg) of salt (sodium) in one serving. Most people should limit total sodium intake to less than 2,300 mg per day. ?Always check the nutrition information of foods labeled as "low-fat" or "nonfat." These foods may be higher in added sugar or refined carbs and should be avoided. ?Talk to your dietitian to identify your daily goals for nutrients listed on the  label. ?Shopping ?Avoid buying canned, pre-made, or processed foods. These foods tend to be high in fat, sodium, and added sugar. ?Shop around the outside edge of the grocery store. This is where you will most often find fresh fruits and vegetables, bulk grains, fresh meats, and fresh dairy products. ?Cooking ?Use low-heat cooking methods, such as baking, instead of high-heat cooking methods, such as deep frying. ?Cook using healthy oils, such as olive, canola,  or sunflower oil. ?Avoid cooking with butter, cream, or high-fat meats. ?Meal planning ?Eat meals and snacks regularly, preferably at the same times every day. Avoid going long periods of time without eating. ?Eat foods that are high in fiber, such as fresh fruits, vegetables, beans, and whole grains. ?Eat 4-6 oz (112-168 g) of lean protein each day, such as lean meat, chicken, fish, eggs, or tofu. One ounce (oz) (28 g) of lean protein is equal to: ?1 oz (28 g) of meat, chicken, or fish. ?1 egg. ?? cup (62 g) of tofu. ?Eat some foods each day that contain healthy fats, such as avocado, nuts, seeds, and fish. ?What foods should I eat? ?Fruits ?Berries. Apples. Oranges. Peaches. Apricots. Plums. Grapes. Mangoes. Papayas. Pomegranates. Kiwi. Cherries. ?Vegetables ?Leafy greens, including lettuce, spinach, kale, chard, collard greens, mustard greens, and cabbage. Beets. Cauliflower. Broccoli. Carrots. Green beans. Tomatoes. Peppers. Onions. Cucumbers. Brussels sprouts. ?Grains ?Whole grains, such as whole-wheat or whole-grain bread, crackers, tortillas, cereal, and pasta. Unsweetened oatmeal. Quinoa. Brown or wild rice. ?Meats and other proteins ?Seafood. Poultry without skin. Lean cuts of poultry and beef. Tofu. Nuts. Seeds. ?Dairy ?Low-fat or fat-free dairy products such as milk, yogurt, and cheese. ?The items listed above may not be a complete list of foods and beverages you can eat and drink. Contact a dietitian for more information. ?What foods should I avoid? ?Fruits ?Fruits canned with syrup. ?Vegetables ?Canned vegetables. Frozen vegetables with butter or cream sauce. ?Grains ?Refined white flour and flour products such as bread, pasta, snack foods, and cereals. Avoid all processed foods. ?Meats and other proteins ?Fatty cuts of meat. Poultry with skin. Breaded or fried meats. Processed meat. Avoid saturated fats. ?Dairy ?Full-fat yogurt, cheese, or milk. ?Beverages ?Sweetened drinks, such as soda or iced  tea. ?The items listed above may not be a complete list of foods and beverages you should avoid. Contact a dietitian for more information. ?Questions to ask a health care provider ?Do I need to meet with a certified diabetes care and education specialist? ?Do I need to meet with a dietitian? ?What number can I call if I have questions? ?When are the best times to check my blood glucose? ?Where to find more information: ?American Diabetes Association: diabetes.org ?Academy of Nutrition and Dietetics: eatright.org ?[employer] of Diabetes and Digestive and Kidney Diseases: AmenCredit.is ?Association of Diabetes Care & Education Specialists: diabeteseducator.org ?Summary ?It is important to have healthy eating habits because your blood sugar (glucose) levels are greatly affected by what you eat and drink. It is important to use alcohol carefully. ?A healthy meal plan will help you manage your blood glucose and lower your risk of heart disease. ?Your health care provider may recommend that you work with a dietitian to make a meal plan that is best for you. ?This information is not intended to replace advice given to you by your health care provider. Make sure you discuss any questions you have with your health care provider. ?Document Revised: 08/26/2019 Document Reviewed: 08/26/2019 ?Elsevier Patient Education ? [place]. ? ?

## 2021-05-26 NOTE — Assessment & Plan Note (Signed)
Chronic, stable with Trazodone.  Continue current medication regimen and adjust as needed.   ?
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
## 2021-05-26 NOTE — Assessment & Plan Note (Signed)
Ongoing.  Continue Vitamin B12 supplement daily.  Recommend she take this daily, no missing doses.  Check CBC and B12 next visit. ?

## 2021-05-26 NOTE — Assessment & Plan Note (Signed)
BMI 32.70.  Recommended eating smaller high protein, low fat meals more frequently and exercising 30 mins a day 5 times a week with a goal of 10-15lb weight loss in the next 3 months. Patient voiced their understanding and motivation to adhere to these recommendations. ? ?

## 2021-06-04 DIAGNOSIS — J4541 Moderate persistent asthma with (acute) exacerbation: Secondary | ICD-10-CM | POA: Diagnosis not present

## 2021-06-04 DIAGNOSIS — J9601 Acute respiratory failure with hypoxia: Secondary | ICD-10-CM | POA: Diagnosis not present

## 2021-06-04 DIAGNOSIS — C3400 Malignant neoplasm of unspecified main bronchus: Secondary | ICD-10-CM | POA: Diagnosis not present

## 2021-07-04 DIAGNOSIS — C3400 Malignant neoplasm of unspecified main bronchus: Secondary | ICD-10-CM | POA: Diagnosis not present

## 2021-07-04 DIAGNOSIS — J4541 Moderate persistent asthma with (acute) exacerbation: Secondary | ICD-10-CM | POA: Diagnosis not present

## 2021-07-04 DIAGNOSIS — J9601 Acute respiratory failure with hypoxia: Secondary | ICD-10-CM | POA: Diagnosis not present

## 2021-07-17 ENCOUNTER — Other Ambulatory Visit: Payer: Self-pay | Admitting: Nurse Practitioner

## 2021-07-17 ENCOUNTER — Other Ambulatory Visit: Payer: Self-pay | Admitting: Family Medicine

## 2021-07-17 DIAGNOSIS — Z1231 Encounter for screening mammogram for malignant neoplasm of breast: Secondary | ICD-10-CM

## 2021-07-28 ENCOUNTER — Encounter: Payer: Self-pay | Admitting: Nurse Practitioner

## 2021-07-28 DIAGNOSIS — F3162 Bipolar disorder, current episode mixed, moderate: Secondary | ICD-10-CM

## 2021-07-30 ENCOUNTER — Encounter: Payer: Self-pay | Admitting: Nurse Practitioner

## 2021-07-31 MED ORDER — ALBUTEROL SULFATE HFA 108 (90 BASE) MCG/ACT IN AERS: INHALATION_SPRAY | RESPIRATORY_TRACT | 4 refills | Status: DC

## 2021-07-31 MED ORDER — FLUTICASONE FUROATE-VILANTEROL 100-25 MCG/ACT IN AEPB: 1.0000 | INHALATION_SPRAY | Freq: Every day | RESPIRATORY_TRACT | 4 refills | Status: DC

## 2021-08-07 DIAGNOSIS — G251 Drug-induced tremor: Secondary | ICD-10-CM | POA: Diagnosis not present

## 2021-08-07 DIAGNOSIS — G2581 Restless legs syndrome: Secondary | ICD-10-CM | POA: Diagnosis not present

## 2021-08-07 DIAGNOSIS — G250 Essential tremor: Secondary | ICD-10-CM | POA: Diagnosis not present

## 2021-08-07 DIAGNOSIS — R519 Headache, unspecified: Secondary | ICD-10-CM | POA: Diagnosis not present

## 2021-08-10 ENCOUNTER — Telehealth: Payer: Self-pay

## 2021-08-10 DIAGNOSIS — F3162 Bipolar disorder, current episode mixed, moderate: Secondary | ICD-10-CM

## 2021-08-10 NOTE — Telephone Encounter (Signed)
Copied from [place] [id number]. Topic: Referral - Request for Referral >> Aug 10, 2021  1:41 PM Tiffany B wrote: Patient would like PCP to place referral with [facility] phone [phone] fax # [phone]. Patient states she lives in [place] and this practice would be convenient   Routing to provider for referral.

## 2021-08-16 ENCOUNTER — Ambulatory Visit: Payer: Medicare Other | Admitting: Nurse Practitioner

## 2021-08-16 ENCOUNTER — Telehealth: Payer: Self-pay | Admitting: Nurse Practitioner

## 2021-08-16 NOTE — Telephone Encounter (Signed)
Patient requests the referral be resent as [facility] to [phone] because she called and they told her they did not receive this yet. Please assist patient further.

## 2021-08-16 NOTE — Telephone Encounter (Signed)
See other phone encounter regarding this.

## 2021-08-16 NOTE — Telephone Encounter (Signed)
Vanessa Baker can you look into this please?

## 2021-08-16 NOTE — Telephone Encounter (Signed)
Called and notified patient that referral has been resent per referral coordinator.

## 2021-08-16 NOTE — Telephone Encounter (Signed)
Patient called in stating she contacted [facility] about a referral that the provider sent on 07/06 but she states they told her they didn't receive it. She is requesting for it to be resent to [phone]. Please assist patient further.

## 2021-08-25 ENCOUNTER — Ambulatory Visit: Payer: Medicare HMO | Admitting: Nurse Practitioner

## 2021-09-03 LAB — FECAL OCCULT BLOOD, GUAIAC: Fecal Occult Blood: NEGATIVE

## 2021-09-06 ENCOUNTER — Ambulatory Visit: Payer: Medicare Other | Admitting: Nurse Practitioner

## 2021-09-06 DIAGNOSIS — G2111 Neuroleptic induced parkinsonism: Secondary | ICD-10-CM

## 2021-09-06 DIAGNOSIS — F17210 Nicotine dependence, cigarettes, uncomplicated: Secondary | ICD-10-CM

## 2021-09-06 DIAGNOSIS — F3162 Bipolar disorder, current episode mixed, moderate: Secondary | ICD-10-CM

## 2021-09-06 DIAGNOSIS — E6609 Other obesity due to excess calories: Secondary | ICD-10-CM

## 2021-09-06 DIAGNOSIS — F4310 Post-traumatic stress disorder, unspecified: Secondary | ICD-10-CM

## 2021-09-06 DIAGNOSIS — G2581 Restless legs syndrome: Secondary | ICD-10-CM

## 2021-09-06 DIAGNOSIS — F1011 Alcohol abuse, in remission: Secondary | ICD-10-CM

## 2021-09-06 DIAGNOSIS — E1169 Type 2 diabetes mellitus with other specified complication: Secondary | ICD-10-CM

## 2021-09-06 DIAGNOSIS — E1159 Type 2 diabetes mellitus with other circulatory complications: Secondary | ICD-10-CM

## 2021-09-07 DIAGNOSIS — H524 Presbyopia: Secondary | ICD-10-CM | POA: Diagnosis not present

## 2021-09-07 DIAGNOSIS — E119 Type 2 diabetes mellitus without complications: Secondary | ICD-10-CM | POA: Diagnosis not present

## 2021-09-07 LAB — HM DIABETES EYE EXAM

## 2021-09-08 DIAGNOSIS — H5213 Myopia, bilateral: Secondary | ICD-10-CM | POA: Diagnosis not present

## 2021-09-17 ENCOUNTER — Other Ambulatory Visit: Payer: Self-pay | Admitting: Nurse Practitioner

## 2021-09-18 NOTE — Telephone Encounter (Signed)
dc'd 07/31/21 J. Cannady FNP "reorder"  Requested Prescriptions  Refused Prescriptions Disp Refills  . [place] 115-21 MCG/ACT inhaler [Pharmacy Med Name: Advair HFA 115-21 MCG/ACT Inhalation Aerosol] 12 g 0    Sig: Inhale 2 puffs by mouth twice daily     Pulmonology:  Combination Products Passed - 09/17/2021 12:24 PM      Passed - Valid encounter within last 12 months    Recent Outpatient Visits          3 months ago Type 2 diabetes mellitus with obesity ([place])   [place], Jolene T, NP   5 months ago Bipolar 1 disorder, mixed, moderate ([place])   [place], Jolene T, NP   6 months ago Bipolar 1 disorder, mixed, moderate ([place])   [place], Jolene T, NP   6 months ago Type 2 diabetes mellitus with obesity ([place])   [place], Jolene T, NP   10 months ago Restless leg syndrome   [facility] [place], Barbaraann Faster, NP

## 2021-09-19 DIAGNOSIS — H5213 Myopia, bilateral: Secondary | ICD-10-CM | POA: Diagnosis not present

## 2021-10-12 DIAGNOSIS — G250 Essential tremor: Secondary | ICD-10-CM | POA: Diagnosis not present

## 2021-10-12 DIAGNOSIS — R519 Headache, unspecified: Secondary | ICD-10-CM | POA: Diagnosis not present

## 2021-10-12 DIAGNOSIS — G251 Drug-induced tremor: Secondary | ICD-10-CM | POA: Diagnosis not present

## 2021-10-12 DIAGNOSIS — G2581 Restless legs syndrome: Secondary | ICD-10-CM | POA: Diagnosis not present

## 2021-10-14 ENCOUNTER — Encounter: Payer: Self-pay | Admitting: Nurse Practitioner

## 2021-10-14 ENCOUNTER — Other Ambulatory Visit: Payer: Self-pay | Admitting: Nurse Practitioner

## 2021-10-16 ENCOUNTER — Other Ambulatory Visit: Payer: Self-pay | Admitting: Nurse Practitioner

## 2021-10-16 MED ORDER — ROPINIROLE HCL 2 MG PO TABS: 2.0000 mg | ORAL_TABLET | Freq: Every day | ORAL | 12 refills | Status: DC

## 2021-10-17 NOTE — Telephone Encounter (Signed)
Advair was dc'd on 07/30/21 and changed to [facility]. Requested Prescriptions  Pending Prescriptions Disp Refills  . omeprazole (PRILOSEC) 20 MG capsule [Pharmacy Med Name: Omeprazole 20 MG Oral Capsule Delayed Release] 90 capsule 0    Sig: Take 1 capsule by mouth once daily     Gastroenterology: Proton Pump Inhibitors Passed - 10/14/2021  9:25 AM      Passed - Valid encounter within last 12 months    Recent Outpatient Visits          4 months ago Type 2 diabetes mellitus with obesity ([place])   [place], Jolene T, NP   6 months ago Bipolar 1 disorder, mixed, moderate ([place])   [place], Jolene T, NP   7 months ago Bipolar 1 disorder, mixed, moderate ([place])   [place], Jolene T, NP   7 months ago Type 2 diabetes mellitus with obesity ([place])   [place], Jolene T, NP   11 months ago Restless leg syndrome   [place], Henrine Screws T, NP             . ADVAIR HFA 115-21 MCG/ACT inhaler [Pharmacy Med Name: Advair HFA 115-21 MCG/ACT Inhalation Aerosol] 12 g 0    Sig: Inhale 2 puffs by mouth twice daily     Pulmonology:  Combination Products Passed - 10/14/2021  9:25 AM      Passed - Valid encounter within last 12 months    Recent Outpatient Visits          4 months ago Type 2 diabetes mellitus with obesity ([place])   [place], Jolene T, NP   6 months ago Bipolar 1 disorder, mixed, moderate ([place])   [place], Jolene T, NP   7 months ago Bipolar 1 disorder, mixed, moderate ([place])   [place], Jolene T, NP   7 months ago Type 2 diabetes mellitus with obesity ([place])   [place], Jolene T, NP   11 months ago Restless leg syndrome   [facility] [place], Barbaraann Faster, NP

## 2021-10-26 ENCOUNTER — Inpatient Hospital Stay
Admission: EM | Admit: 2021-10-26 | Discharge: 2021-10-28 | DRG: 493 | Disposition: A | Discharge status: Home Health | Payer: Medicare Other | Attending: Internal Medicine | Admitting: Internal Medicine

## 2021-10-26 ENCOUNTER — Other Ambulatory Visit: Payer: Self-pay

## 2021-10-26 ENCOUNTER — Encounter: Payer: Self-pay | Admitting: Emergency Medicine

## 2021-10-26 ENCOUNTER — Inpatient Hospital Stay: Payer: Medicare Other

## 2021-10-26 ENCOUNTER — Emergency Department: Payer: Medicare Other

## 2021-10-26 ENCOUNTER — Inpatient Hospital Stay: Payer: Medicare Other | Admitting: Certified Registered"

## 2021-10-26 ENCOUNTER — Encounter
Admission: EM | Disposition: A | Discharge status: Home Health | Payer: Self-pay | Source: Home / Self Care | Attending: Internal Medicine

## 2021-10-26 DIAGNOSIS — S82241A Displaced spiral fracture of shaft of right tibia, initial encounter for closed fracture: Secondary | ICD-10-CM | POA: Diagnosis not present

## 2021-10-26 DIAGNOSIS — S82201D Unspecified fracture of shaft of right tibia, subsequent encounter for closed fracture with routine healing: Secondary | ICD-10-CM | POA: Diagnosis present

## 2021-10-26 DIAGNOSIS — S82391A Other fracture of lower end of right tibia, initial encounter for closed fracture: Secondary | ICD-10-CM | POA: Diagnosis not present

## 2021-10-26 DIAGNOSIS — Z720 Tobacco use: Secondary | ICD-10-CM | POA: Diagnosis not present

## 2021-10-26 DIAGNOSIS — E1169 Type 2 diabetes mellitus with other specified complication: Secondary | ICD-10-CM | POA: Diagnosis not present

## 2021-10-26 DIAGNOSIS — M7989 Other specified soft tissue disorders: Secondary | ICD-10-CM | POA: Diagnosis not present

## 2021-10-26 DIAGNOSIS — Z888 Allergy status to other drugs, medicaments and biological substances status: Secondary | ICD-10-CM

## 2021-10-26 DIAGNOSIS — E1140 Type 2 diabetes mellitus with diabetic neuropathy, unspecified: Secondary | ICD-10-CM | POA: Diagnosis not present

## 2021-10-26 DIAGNOSIS — Z820 Family history of epilepsy and other diseases of the nervous system: Secondary | ICD-10-CM

## 2021-10-26 DIAGNOSIS — M79662 Pain in left lower leg: Secondary | ICD-10-CM | POA: Diagnosis not present

## 2021-10-26 DIAGNOSIS — S82201A Unspecified fracture of shaft of right tibia, initial encounter for closed fracture: Secondary | ICD-10-CM | POA: Diagnosis not present

## 2021-10-26 DIAGNOSIS — Z808 Family history of malignant neoplasm of other organs or systems: Secondary | ICD-10-CM | POA: Diagnosis not present

## 2021-10-26 DIAGNOSIS — Z87891 Personal history of nicotine dependence: Secondary | ICD-10-CM | POA: Diagnosis not present

## 2021-10-26 DIAGNOSIS — G2581 Restless legs syndrome: Secondary | ICD-10-CM

## 2021-10-26 DIAGNOSIS — W010XXA Fall on same level from slipping, tripping and stumbling without subsequent striking against object, initial encounter: Secondary | ICD-10-CM | POA: Diagnosis present

## 2021-10-26 DIAGNOSIS — J449 Chronic obstructive pulmonary disease, unspecified: Secondary | ICD-10-CM | POA: Diagnosis present

## 2021-10-26 DIAGNOSIS — F3162 Bipolar disorder, current episode mixed, moderate: Secondary | ICD-10-CM | POA: Diagnosis present

## 2021-10-26 DIAGNOSIS — Z79899 Other long term (current) drug therapy: Secondary | ICD-10-CM

## 2021-10-26 DIAGNOSIS — Z803 Family history of malignant neoplasm of breast: Secondary | ICD-10-CM

## 2021-10-26 DIAGNOSIS — S82431D Displaced oblique fracture of shaft of right fibula, subsequent encounter for closed fracture with routine healing: Secondary | ICD-10-CM | POA: Diagnosis not present

## 2021-10-26 DIAGNOSIS — E669 Obesity, unspecified: Secondary | ICD-10-CM

## 2021-10-26 DIAGNOSIS — Z743 Need for continuous supervision: Secondary | ICD-10-CM | POA: Diagnosis not present

## 2021-10-26 DIAGNOSIS — S82301A Unspecified fracture of lower end of right tibia, initial encounter for closed fracture: Secondary | ICD-10-CM | POA: Diagnosis present

## 2021-10-26 DIAGNOSIS — E1159 Type 2 diabetes mellitus with other circulatory complications: Secondary | ICD-10-CM | POA: Diagnosis not present

## 2021-10-26 DIAGNOSIS — E785 Hyperlipidemia, unspecified: Secondary | ICD-10-CM

## 2021-10-26 DIAGNOSIS — Z6833 Body mass index (BMI) 33.0-33.9, adult: Secondary | ICD-10-CM

## 2021-10-26 DIAGNOSIS — D72829 Elevated white blood cell count, unspecified: Secondary | ICD-10-CM

## 2021-10-26 DIAGNOSIS — Z813 Family history of other psychoactive substance abuse and dependence: Secondary | ICD-10-CM | POA: Diagnosis not present

## 2021-10-26 DIAGNOSIS — Z8781 Personal history of (healed) traumatic fracture: Secondary | ICD-10-CM | POA: Diagnosis present

## 2021-10-26 DIAGNOSIS — Z8249 Family history of ischemic heart disease and other diseases of the circulatory system: Secondary | ICD-10-CM

## 2021-10-26 DIAGNOSIS — Y92009 Unspecified place in unspecified non-institutional (private) residence as the place of occurrence of the external cause: Secondary | ICD-10-CM | POA: Diagnosis not present

## 2021-10-26 DIAGNOSIS — M79605 Pain in left leg: Secondary | ICD-10-CM | POA: Diagnosis not present

## 2021-10-26 DIAGNOSIS — R0600 Dyspnea, unspecified: Secondary | ICD-10-CM | POA: Diagnosis not present

## 2021-10-26 DIAGNOSIS — S82401A Unspecified fracture of shaft of right fibula, initial encounter for closed fracture: Secondary | ICD-10-CM | POA: Diagnosis not present

## 2021-10-26 DIAGNOSIS — S82251D Displaced comminuted fracture of shaft of right tibia, subsequent encounter for closed fracture with routine healing: Secondary | ICD-10-CM | POA: Diagnosis not present

## 2021-10-26 DIAGNOSIS — W19XXXA Unspecified fall, initial encounter: Principal | ICD-10-CM

## 2021-10-26 DIAGNOSIS — Z9049 Acquired absence of other specified parts of digestive tract: Secondary | ICD-10-CM

## 2021-10-26 DIAGNOSIS — J4540 Moderate persistent asthma, uncomplicated: Secondary | ICD-10-CM | POA: Diagnosis not present

## 2021-10-26 DIAGNOSIS — K219 Gastro-esophageal reflux disease without esophagitis: Secondary | ICD-10-CM

## 2021-10-26 DIAGNOSIS — I152 Hypertension secondary to endocrine disorders: Secondary | ICD-10-CM | POA: Diagnosis not present

## 2021-10-26 DIAGNOSIS — R6889 Other general symptoms and signs: Secondary | ICD-10-CM | POA: Diagnosis not present

## 2021-10-26 DIAGNOSIS — Z811 Family history of alcohol abuse and dependence: Secondary | ICD-10-CM | POA: Diagnosis not present

## 2021-10-26 DIAGNOSIS — S82451A Displaced comminuted fracture of shaft of right fibula, initial encounter for closed fracture: Secondary | ICD-10-CM | POA: Diagnosis not present

## 2021-10-26 DIAGNOSIS — E1149 Type 2 diabetes mellitus with other diabetic neurological complication: Secondary | ICD-10-CM

## 2021-10-26 DIAGNOSIS — Z818 Family history of other mental and behavioral disorders: Secondary | ICD-10-CM | POA: Diagnosis not present

## 2021-10-26 DIAGNOSIS — Z807 Family history of other malignant neoplasms of lymphoid, hematopoietic and related tissues: Secondary | ICD-10-CM

## 2021-10-26 DIAGNOSIS — I10 Essential (primary) hypertension: Secondary | ICD-10-CM | POA: Diagnosis not present

## 2021-10-26 DIAGNOSIS — Z806 Family history of leukemia: Secondary | ICD-10-CM

## 2021-10-26 DIAGNOSIS — R0689 Other abnormalities of breathing: Secondary | ICD-10-CM | POA: Diagnosis not present

## 2021-10-26 DIAGNOSIS — S82831A Other fracture of upper and lower end of right fibula, initial encounter for closed fracture: Secondary | ICD-10-CM | POA: Diagnosis not present

## 2021-10-26 DIAGNOSIS — Z833 Family history of diabetes mellitus: Secondary | ICD-10-CM

## 2021-10-26 DIAGNOSIS — Z4789 Encounter for other orthopedic aftercare: Secondary | ICD-10-CM | POA: Diagnosis not present

## 2021-10-26 HISTORY — PX: TIBIA IM NAIL INSERTION: SHX2516

## 2021-10-26 LAB — CBC WITH DIFFERENTIAL/PLATELET
Abs Immature Granulocytes: 0.08 10*3/uL — ABNORMAL HIGH (ref 0.00–0.07)
Basophils Absolute: 0.3 10*3/uL — ABNORMAL HIGH (ref 0.0–0.1)
Basophils Relative: 2 %
Eosinophils Absolute: 0.1 10*3/uL (ref 0.0–0.5)
Eosinophils Relative: 1 %
HCT: 46.1 % — ABNORMAL HIGH (ref 36.0–46.0)
Hemoglobin: 15.3 g/dL — ABNORMAL HIGH (ref 12.0–15.0)
Immature Granulocytes: 1 %
Lymphocytes Relative: 17 %
Lymphs Abs: 2.2 10*3/uL (ref 0.7–4.0)
MCH: 30.4 pg (ref 26.0–34.0)
MCHC: 33.2 g/dL (ref 30.0–36.0)
MCV: 91.7 fL (ref 80.0–100.0)
Monocytes Absolute: 0.7 10*3/uL (ref 0.1–1.0)
Monocytes Relative: 5 %
Neutro Abs: 9.3 10*3/uL — ABNORMAL HIGH (ref 1.7–7.7)
Neutrophils Relative %: 74 %
Platelets: 290 10*3/uL (ref 150–400)
RBC: 5.03 MIL/uL (ref 3.87–5.11)
RDW: 12.7 % (ref 11.5–15.5)
WBC: 12.6 10*3/uL — ABNORMAL HIGH (ref 4.0–10.5)
nRBC: 0 % (ref 0.0–0.2)

## 2021-10-26 LAB — BASIC METABOLIC PANEL
Anion gap: 8 (ref 5–15)
BUN: 12 mg/dL (ref 6–20)
CO2: 26 mmol/L (ref 22–32)
Calcium: 8.8 mg/dL — ABNORMAL LOW (ref 8.9–10.3)
Chloride: 105 mmol/L (ref 98–111)
Creatinine, Ser: 0.59 mg/dL (ref 0.44–1.00)
GFR, Estimated: >60 mL/min (ref >60)
Glucose, Bld: 161 mg/dL — ABNORMAL HIGH (ref 70–99)
Potassium: 4.1 mmol/L (ref 3.5–5.1)
Sodium: 139 mmol/L (ref 135–145)

## 2021-10-26 LAB — GLUCOSE, CAPILLARY: Glucose-Capillary: 222 mg/dL — ABNORMAL HIGH (ref 70–99)

## 2021-10-26 LAB — CBG MONITORING, ED
Glucose-Capillary: 186 mg/dL — ABNORMAL HIGH (ref 70–99)
Glucose-Capillary: 176 mg/dL — ABNORMAL HIGH (ref 70–99)
Glucose-Capillary: 217 mg/dL — ABNORMAL HIGH (ref 70–99)

## 2021-10-26 LAB — TYPE AND SCREEN
ABO/RH(D): A POS
Antibody Screen: NEGATIVE

## 2021-10-26 LAB — HEMOGLOBIN A1C
Hgb A1c MFr Bld: 7 % — ABNORMAL HIGH (ref 4.8–5.6)
Mean Plasma Glucose: 154.2 mg/dL

## 2021-10-26 LAB — HIV ANTIBODY (ROUTINE TESTING W REFLEX): HIV Screen 4th Generation wRfx: NONREACTIVE

## 2021-10-26 LAB — PROTIME-INR
INR: 1.1 (ref 0.8–1.2)
Prothrombin Time: 13.6 seconds (ref 11.4–15.2)

## 2021-10-26 SURGERY — INSERTION, INTRAMEDULLARY ROD, TIBIA
Anesthesia: General | Laterality: Right

## 2021-10-26 MED ORDER — CARBAMAZEPINE 200 MG PO TABS
200.0000 mg | ORAL_TABLET | Freq: Every day | ORAL | Status: DC
  Administered 2021-10-26 – 2021-10-28 (×3): 200 mg via ORAL
  Filled 2021-10-26 (×3): qty 1

## 2021-10-26 MED ORDER — LIDOCAINE HCL (CARDIAC) PF 100 MG/5ML IV SOSY
PREFILLED_SYRINGE | INTRAVENOUS | Status: DC | PRN
  Administered 2021-10-26: 50 mg via INTRAVENOUS

## 2021-10-26 MED ORDER — MIDAZOLAM HCL 2 MG/2ML IJ SOLN
INTRAMUSCULAR | Status: DC | PRN
  Administered 2021-10-26: 2 mg via INTRAVENOUS

## 2021-10-26 MED ORDER — ONDANSETRON HCL 4 MG/2ML IJ SOLN: 4.0000 mg | Freq: Four times a day (QID) | INTRAMUSCULAR | Status: DC | PRN

## 2021-10-26 MED ORDER — ORAL CARE MOUTH RINSE
15.0000 mL | Freq: Once | OROMUCOSAL | Status: AC
  Filled 2021-10-26: qty 15

## 2021-10-26 MED ORDER — FENTANYL CITRATE (PF) 100 MCG/2ML IJ SOLN
INTRAMUSCULAR | Status: AC
  Filled 2021-10-26: qty 2

## 2021-10-26 MED ORDER — METOCLOPRAMIDE HCL 5 MG PO TABS: 5.0000 mg | ORAL_TABLET | Freq: Three times a day (TID) | ORAL | Status: DC | PRN

## 2021-10-26 MED ORDER — MORPHINE SULFATE (PF) 2 MG/ML IV SOLN
2.0000 mg | INTRAVENOUS | Status: DC | PRN
  Administered 2021-10-26 (×2): 2 mg via INTRAVENOUS
  Filled 2021-10-26 (×2): qty 1

## 2021-10-26 MED ORDER — MORPHINE SULFATE (PF) 2 MG/ML IV SOLN: 0.5000 mg | INTRAVENOUS | Status: DC | PRN

## 2021-10-26 MED ORDER — HYDROMORPHONE HCL 1 MG/ML IJ SOLN
INTRAMUSCULAR | Status: DC | PRN
  Administered 2021-10-26: 1 mg via INTRAVENOUS

## 2021-10-26 MED ORDER — IPRATROPIUM-ALBUTEROL 0.5-2.5 (3) MG/3ML IN SOLN
3.0000 mL | RESPIRATORY_TRACT | Status: AC
  Administered 2021-10-26: 3 mL via RESPIRATORY_TRACT

## 2021-10-26 MED ORDER — INSULIN ASPART 100 UNIT/ML IJ SOLN
0.0000 [IU] | Freq: Every day | INTRAMUSCULAR | Status: DC
  Administered 2021-10-26: 2 [IU] via SUBCUTANEOUS
  Filled 2021-10-26: qty 1

## 2021-10-26 MED ORDER — OLANZAPINE 5 MG PO TABS
20.0000 mg | ORAL_TABLET | Freq: Every day | ORAL | Status: DC
  Administered 2021-10-26 – 2021-10-27 (×2): 20 mg via ORAL
  Filled 2021-10-26 (×2): qty 4

## 2021-10-26 MED ORDER — PHENOL 1.4 % MT LIQD: 1.0000 | OROMUCOSAL | Status: DC | PRN

## 2021-10-26 MED ORDER — DEXMEDETOMIDINE HCL IN NACL 200 MCG/50ML IV SOLN
INTRAVENOUS | Status: DC | PRN
  Administered 2021-10-26: 12 ug via INTRAVENOUS
  Administered 2021-10-26: 8 ug via INTRAVENOUS

## 2021-10-26 MED ORDER — MIDAZOLAM HCL 2 MG/2ML IJ SOLN
INTRAMUSCULAR | Status: AC
  Filled 2021-10-26: qty 2

## 2021-10-26 MED ORDER — ACETAMINOPHEN 325 MG PO TABS
325.0000 mg | ORAL_TABLET | Freq: Four times a day (QID) | ORAL | Status: DC | PRN
  Administered 2021-10-28: 325 mg via ORAL
  Administered 2021-10-28: 650 mg via ORAL
  Filled 2021-10-26: qty 2

## 2021-10-26 MED ORDER — IPRATROPIUM-ALBUTEROL 0.5-2.5 (3) MG/3ML IN SOLN
RESPIRATORY_TRACT | Status: AC
  Filled 2021-10-26: qty 3

## 2021-10-26 MED ORDER — PREGABALIN 50 MG PO CAPS: 100.0000 mg | ORAL_CAPSULE | Freq: Two times a day (BID) | ORAL | Status: DC

## 2021-10-26 MED ORDER — ACETAMINOPHEN 10 MG/ML IV SOLN
1000.0000 mg | Freq: Once | INTRAVENOUS | Status: DC | PRN
  Administered 2021-10-26: 1000 mg via INTRAVENOUS

## 2021-10-26 MED ORDER — CHLORHEXIDINE GLUCONATE 0.12 % MT SOLN: 15.0000 mL | Freq: Once | OROMUCOSAL | Status: AC

## 2021-10-26 MED ORDER — INSULIN ASPART 100 UNIT/ML IJ SOLN
0.0000 [IU] | Freq: Three times a day (TID) | INTRAMUSCULAR | Status: DC
  Administered 2021-10-27: 5 [IU] via SUBCUTANEOUS
  Administered 2021-10-27: 2 [IU] via SUBCUTANEOUS
  Administered 2021-10-27: 3 [IU] via SUBCUTANEOUS
  Administered 2021-10-28: 8 [IU] via SUBCUTANEOUS
  Filled 2021-10-26 (×4): qty 1

## 2021-10-26 MED ORDER — KETAMINE HCL 50 MG/5ML IJ SOSY
PREFILLED_SYRINGE | INTRAMUSCULAR | Status: AC
  Filled 2021-10-26: qty 5

## 2021-10-26 MED ORDER — VITAMIN B-12 1000 MCG PO TABS
1000.0000 ug | ORAL_TABLET | Freq: Every day | ORAL | Status: DC
  Administered 2021-10-26 – 2021-10-28 (×3): 1000 ug via ORAL
  Filled 2021-10-26 (×3): qty 1
  Filled 2021-10-26: qty 10

## 2021-10-26 MED ORDER — SERTRALINE HCL 50 MG PO TABS
50.0000 mg | ORAL_TABLET | Freq: Every day | ORAL | Status: DC
  Administered 2021-10-26 – 2021-10-28 (×3): 50 mg via ORAL
  Filled 2021-10-26 (×3): qty 1

## 2021-10-26 MED ORDER — OXYCODONE HCL 5 MG PO TABS: 5.0000 mg | ORAL_TABLET | Freq: Once | ORAL | Status: DC | PRN

## 2021-10-26 MED ORDER — FENTANYL CITRATE PF 50 MCG/ML IJ SOSY
50.0000 ug | PREFILLED_SYRINGE | Freq: Once | INTRAMUSCULAR | Status: AC
  Administered 2021-10-26: 50 ug via INTRAVENOUS

## 2021-10-26 MED ORDER — CARBAMAZEPINE 200 MG PO TABS
300.0000 mg | ORAL_TABLET | Freq: Every day | ORAL | Status: DC
  Administered 2021-10-26 – 2021-10-27 (×2): 300 mg via ORAL
  Filled 2021-10-26 (×2): qty 2
  Filled 2021-10-26 (×2): qty 1.5

## 2021-10-26 MED ORDER — CEFAZOLIN SODIUM-DEXTROSE 2-4 GM/100ML-% IV SOLN
INTRAVENOUS | Status: AC
  Filled 2021-10-26: qty 100

## 2021-10-26 MED ORDER — GABAPENTIN 600 MG PO TABS
600.0000 mg | ORAL_TABLET | Freq: Every evening | ORAL | Status: DC
  Administered 2021-10-27: 600 mg via ORAL
  Filled 2021-10-26: qty 1

## 2021-10-26 MED ORDER — DEXAMETHASONE SODIUM PHOSPHATE 10 MG/ML IJ SOLN
INTRAMUSCULAR | Status: AC
  Filled 2021-10-26: qty 1

## 2021-10-26 MED ORDER — CHLORHEXIDINE GLUCONATE 0.12 % MT SOLN
OROMUCOSAL | Status: AC
  Administered 2021-10-26: 15 mL via OROMUCOSAL
  Filled 2021-10-26: qty 15

## 2021-10-26 MED ORDER — LACTATED RINGERS IV SOLN: INTRAVENOUS | Status: DC | PRN

## 2021-10-26 MED ORDER — EPHEDRINE 5 MG/ML INJ
INTRAVENOUS | Status: AC
  Filled 2021-10-26: qty 5

## 2021-10-26 MED ORDER — ONDANSETRON HCL 4 MG PO TABS: 4.0000 mg | ORAL_TABLET | Freq: Four times a day (QID) | ORAL | Status: DC | PRN

## 2021-10-26 MED ORDER — ACETAMINOPHEN 325 MG PO TABS: 650.0000 mg | ORAL_TABLET | Freq: Four times a day (QID) | ORAL | Status: DC | PRN

## 2021-10-26 MED ORDER — MENTHOL 3 MG MT LOZG: 1.0000 | LOZENGE | OROMUCOSAL | Status: DC | PRN

## 2021-10-26 MED ORDER — ASPIRIN 325 MG PO TBEC
325.0000 mg | DELAYED_RELEASE_TABLET | Freq: Every day | ORAL | Status: DC
  Administered 2021-10-27 – 2021-10-28 (×2): 325 mg via ORAL
  Filled 2021-10-26 (×2): qty 1

## 2021-10-26 MED ORDER — ACETAMINOPHEN 10 MG/ML IV SOLN
INTRAVENOUS | Status: AC
  Filled 2021-10-26: qty 100

## 2021-10-26 MED ORDER — CEFAZOLIN SODIUM-DEXTROSE 2-4 GM/100ML-% IV SOLN
2.0000 g | INTRAVENOUS | Status: AC
  Administered 2021-10-26: 2 g via INTRAVENOUS

## 2021-10-26 MED ORDER — DOCUSATE SODIUM 100 MG PO CAPS
100.0000 mg | ORAL_CAPSULE | Freq: Two times a day (BID) | ORAL | Status: DC
  Administered 2021-10-26 – 2021-10-28 (×4): 100 mg via ORAL
  Filled 2021-10-26 (×4): qty 1

## 2021-10-26 MED ORDER — LIDOCAINE HCL (PF) 1 % IJ SOLN
INTRAMUSCULAR | Status: DC | PRN
  Administered 2021-10-26: 20 mL

## 2021-10-26 MED ORDER — NICOTINE 21 MG/24HR TD PT24
21.0000 mg | MEDICATED_PATCH | Freq: Every day | TRANSDERMAL | Status: DC
  Administered 2021-10-26 – 2021-10-28 (×3): 21 mg via TRANSDERMAL
  Filled 2021-10-26 (×3): qty 1

## 2021-10-26 MED ORDER — ROCURONIUM BROMIDE 100 MG/10ML IV SOLN
INTRAVENOUS | Status: DC | PRN
  Administered 2021-10-26: 50 mg via INTRAVENOUS

## 2021-10-26 MED ORDER — HYDROCODONE-ACETAMINOPHEN 7.5-325 MG PO TABS: 1.0000 | ORAL_TABLET | ORAL | Status: DC | PRN

## 2021-10-26 MED ORDER — 0.9 % SODIUM CHLORIDE (POUR BTL) OPTIME
TOPICAL | Status: DC | PRN
  Administered 2021-10-26: 500 mL

## 2021-10-26 MED ORDER — HYDROCODONE-ACETAMINOPHEN 5-325 MG PO TABS
1.0000 | ORAL_TABLET | ORAL | Status: DC | PRN
  Administered 2021-10-27 – 2021-10-28 (×3): 2 via ORAL
  Filled 2021-10-26 (×4): qty 2

## 2021-10-26 MED ORDER — SEVOFLURANE IN SOLN
RESPIRATORY_TRACT | Status: AC
  Filled 2021-10-26: qty 250

## 2021-10-26 MED ORDER — PROPOFOL 10 MG/ML IV BOLUS
INTRAVENOUS | Status: DC | PRN
  Administered 2021-10-26: 150 mg via INTRAVENOUS

## 2021-10-26 MED ORDER — FLUTICASONE FUROATE-VILANTEROL 100-25 MCG/ACT IN AEPB
1.0000 | INHALATION_SPRAY | Freq: Every day | RESPIRATORY_TRACT | Status: DC
  Administered 2021-10-27 – 2021-10-28 (×2): 1 via RESPIRATORY_TRACT
  Filled 2021-10-26: qty 28

## 2021-10-26 MED ORDER — HYDROMORPHONE HCL 1 MG/ML IJ SOLN
2.0000 mg | INTRAMUSCULAR | Status: DC | PRN
  Administered 2021-10-26 (×2): 2 mg via INTRAVENOUS
  Filled 2021-10-26 (×2): qty 2

## 2021-10-26 MED ORDER — LIDOCAINE HCL (PF) 1 % IJ SOLN
INTRAMUSCULAR | Status: AC
  Filled 2021-10-26: qty 30

## 2021-10-26 MED ORDER — ROPINIROLE HCL 1 MG PO TABS
2.0000 mg | ORAL_TABLET | Freq: Two times a day (BID) | ORAL | Status: DC
  Administered 2021-10-26 – 2021-10-28 (×5): 2 mg via ORAL
  Filled 2021-10-26 (×5): qty 2

## 2021-10-26 MED ORDER — ACETAMINOPHEN 650 MG RE SUPP: 650.0000 mg | Freq: Four times a day (QID) | RECTAL | Status: DC | PRN

## 2021-10-26 MED ORDER — OXYCODONE HCL 5 MG/5ML PO SOLN: 5.0000 mg | Freq: Once | ORAL | Status: DC | PRN

## 2021-10-26 MED ORDER — BUPIVACAINE-EPINEPHRINE (PF) 0.5% -1:200000 IJ SOLN
INTRAMUSCULAR | Status: AC
  Filled 2021-10-26: qty 30

## 2021-10-26 MED ORDER — SUGAMMADEX SODIUM 200 MG/2ML IV SOLN
INTRAVENOUS | Status: DC | PRN
  Administered 2021-10-26: 150 mg via INTRAVENOUS

## 2021-10-26 MED ORDER — TRAZODONE HCL 100 MG PO TABS
200.0000 mg | ORAL_TABLET | Freq: Every day | ORAL | Status: DC
  Administered 2021-10-26 – 2021-10-27 (×2): 200 mg via ORAL
  Filled 2021-10-26 (×2): qty 2

## 2021-10-26 MED ORDER — HYDROMORPHONE HCL 1 MG/ML IJ SOLN
INTRAMUSCULAR | Status: AC
  Filled 2021-10-26: qty 1

## 2021-10-26 MED ORDER — FENTANYL CITRATE (PF) 100 MCG/2ML IJ SOLN: 25.0000 ug | INTRAMUSCULAR | Status: DC | PRN

## 2021-10-26 MED ORDER — FENTANYL CITRATE PF 50 MCG/ML IJ SOSY
PREFILLED_SYRINGE | INTRAMUSCULAR | Status: AC
  Filled 2021-10-26: qty 1

## 2021-10-26 MED ORDER — OXYCODONE HCL 5 MG PO TABS: 5.0000 mg | ORAL_TABLET | ORAL | 0 refills | Status: DC | PRN

## 2021-10-26 MED ORDER — INSULIN ASPART 100 UNIT/ML IJ SOLN: 0.0000 [IU] | Freq: Four times a day (QID) | INTRAMUSCULAR | Status: DC

## 2021-10-26 MED ORDER — PANTOPRAZOLE SODIUM 40 MG PO TBEC
40.0000 mg | DELAYED_RELEASE_TABLET | Freq: Every day | ORAL | Status: DC
  Administered 2021-10-26 – 2021-10-28 (×3): 40 mg via ORAL
  Filled 2021-10-26 (×3): qty 1

## 2021-10-26 MED ORDER — CEFAZOLIN SODIUM-DEXTROSE 2-4 GM/100ML-% IV SOLN
2.0000 g | Freq: Four times a day (QID) | INTRAVENOUS | Status: AC
  Administered 2021-10-26 – 2021-10-27 (×2): 2 g via INTRAVENOUS
  Filled 2021-10-26 (×2): qty 100

## 2021-10-26 MED ORDER — SODIUM CHLORIDE 0.9 % IV SOLN: INTRAVENOUS | Status: DC

## 2021-10-26 MED ORDER — FENTANYL CITRATE (PF) 100 MCG/2ML IJ SOLN
INTRAMUSCULAR | Status: DC | PRN
  Administered 2021-10-26: 100 ug via INTRAVENOUS

## 2021-10-26 MED ORDER — PRIMIDONE 50 MG PO TABS
50.0000 mg | ORAL_TABLET | Freq: Every day | ORAL | Status: DC
  Administered 2021-10-26 – 2021-10-27 (×2): 50 mg via ORAL
  Filled 2021-10-26 (×2): qty 1

## 2021-10-26 MED ORDER — ONDANSETRON HCL 4 MG/2ML IJ SOLN
INTRAMUSCULAR | Status: DC | PRN
  Administered 2021-10-26: 4 mg via INTRAVENOUS

## 2021-10-26 MED ORDER — DEXAMETHASONE SODIUM PHOSPHATE 10 MG/ML IJ SOLN
INTRAMUSCULAR | Status: DC | PRN
  Administered 2021-10-26: 10 mg via INTRAVENOUS

## 2021-10-26 MED ORDER — GABAPENTIN 600 MG PO TABS
1200.0000 mg | ORAL_TABLET | Freq: Every day | ORAL | Status: DC
  Administered 2021-10-26 – 2021-10-27 (×2): 1200 mg via ORAL
  Filled 2021-10-26 (×2): qty 2

## 2021-10-26 MED ORDER — ACETAMINOPHEN 500 MG PO TABS
500.0000 mg | ORAL_TABLET | Freq: Four times a day (QID) | ORAL | Status: AC
  Administered 2021-10-27 (×2): 500 mg via ORAL
  Filled 2021-10-26 (×2): qty 1

## 2021-10-26 MED ORDER — BENZTROPINE MESYLATE 1 MG PO TABS
1.0000 mg | ORAL_TABLET | Freq: Every day | ORAL | Status: DC
  Administered 2021-10-26 – 2021-10-27 (×2): 1 mg via ORAL
  Filled 2021-10-26 (×3): qty 1

## 2021-10-26 MED ORDER — ALBUTEROL SULFATE (2.5 MG/3ML) 0.083% IN NEBU: 3.0000 mL | INHALATION_SOLUTION | Freq: Four times a day (QID) | RESPIRATORY_TRACT | Status: DC | PRN

## 2021-10-26 MED ORDER — OXYCODONE HCL 5 MG PO TABS
5.0000 mg | ORAL_TABLET | Freq: Four times a day (QID) | ORAL | Status: DC | PRN
  Administered 2021-10-26: 5 mg via ORAL
  Administered 2021-10-27 – 2021-10-28 (×4): 10 mg via ORAL
  Filled 2021-10-26: qty 2
  Filled 2021-10-26: qty 1
  Filled 2021-10-26 (×4): qty 2

## 2021-10-26 MED ORDER — ONDANSETRON HCL 4 MG/2ML IJ SOLN
INTRAMUSCULAR | Status: AC
  Filled 2021-10-26: qty 2

## 2021-10-26 MED ORDER — METOCLOPRAMIDE HCL 5 MG/ML IJ SOLN: 5.0000 mg | Freq: Three times a day (TID) | INTRAMUSCULAR | Status: DC | PRN

## 2021-10-26 MED ORDER — ATORVASTATIN CALCIUM 20 MG PO TABS
40.0000 mg | ORAL_TABLET | Freq: Every day | ORAL | Status: DC
  Administered 2021-10-26 – 2021-10-28 (×3): 40 mg via ORAL
  Filled 2021-10-26 (×3): qty 2

## 2021-10-26 SURGICAL SUPPLY — 61 items
BIT DRILL LONG 4.0 (BIT) IMPLANT
BIT DRILL SHORT 4.0 (BIT) IMPLANT
BNDG COHESIVE 6X5 TAN ST LF (GAUZE/BANDAGES/DRESSINGS) ×3 IMPLANT
BNDG ELASTIC 4X5.8 VLCR STR LF (GAUZE/BANDAGES/DRESSINGS) IMPLANT
BNDG ELASTIC 6X5.8 VLCR STR LF (GAUZE/BANDAGES/DRESSINGS) IMPLANT
BNDG ESMARK 6X12 TAN STRL LF (GAUZE/BANDAGES/DRESSINGS) IMPLANT
CHLORAPREP W/TINT 26 (MISCELLANEOUS) ×1 IMPLANT
DRAPE 3/4 80X56 (DRAPES) ×2 IMPLANT
DRAPE C-ARM 42X72 X-RAY (DRAPES) ×1 IMPLANT
DRAPE SURG 17X11 SM STRL (DRAPES) ×2 IMPLANT
DRAPE U-SHAPE 47X51 STRL (DRAPES) ×1 IMPLANT
DRILL BIT LONG 4.0 (BIT) ×1
DRILL BIT SHORT 4.0 (BIT) ×2
DRSG OPSITE POSTOP 3X4 (GAUZE/BANDAGES/DRESSINGS) ×2 IMPLANT
DRSG TEGADERM 4X4.75 (GAUZE/BANDAGES/DRESSINGS) IMPLANT
DRSG XEROFORM 1X8 (GAUZE/BANDAGES/DRESSINGS) IMPLANT
ELECT REM PT RETURN 9FT ADLT (ELECTROSURGICAL) ×1
ELECTRODE REM PT RTRN 9FT ADLT (ELECTROSURGICAL) ×1 IMPLANT
GAUZE SPONGE 4X4 12PLY STRL (GAUZE/BANDAGES/DRESSINGS) ×1 IMPLANT
GAUZE XEROFORM 1X8 LF (GAUZE/BANDAGES/DRESSINGS) ×1 IMPLANT
GLOVE BIO SURGEON STRL SZ7.5 (GLOVE) ×1 IMPLANT
GLOVE SURG UNDER POLY LF SZ7.5 (GLOVE) ×1 IMPLANT
GOWN STRL REUS W/ TWL XL LVL3 (GOWN DISPOSABLE) ×1 IMPLANT
GOWN STRL REUS W/TWL XL LVL3 (GOWN DISPOSABLE) ×1
GOWN STRL REUS W/TWL XL LVL4 (GOWN DISPOSABLE) ×1 IMPLANT
GUIDE PIN 3.2X343 (PIN) ×1
GUIDE PIN 3.2X343MM (PIN) ×1
GUIDE ROD 3.0 (MISCELLANEOUS) ×1
HOLDER FOLEY CATH W/STRAP (MISCELLANEOUS) ×1 IMPLANT
KIT TURNOVER CYSTO (KITS) ×1 IMPLANT
MANIFOLD NEPTUNE II (INSTRUMENTS) ×1 IMPLANT
MAT ABSORB  FLUID 56X50 GRAY (MISCELLANEOUS) ×1
MAT ABSORB FLUID 56X50 GRAY (MISCELLANEOUS) ×1 IMPLANT
NAIL TIBIAL 8.5X32 (Nail) IMPLANT
NS IRRIG 1000ML POUR BTL (IV SOLUTION) ×1 IMPLANT
PACK HIP COMPR (MISCELLANEOUS) ×1 IMPLANT
PAD ABD DERMACEA PRESS 5X9 (GAUZE/BANDAGES/DRESSINGS) ×1 IMPLANT
PAD ARMBOARD 7.5X6 YLW CONV (MISCELLANEOUS) ×1 IMPLANT
PAD CAST 4YDX4 CTTN HI CHSV (CAST SUPPLIES) IMPLANT
PADDING CAST COTTON 4X4 STRL (CAST SUPPLIES) ×2
PADDING CAST COTTON 6X4 STRL (CAST SUPPLIES) IMPLANT
PIN GUIDE 3.2X343MM (PIN) IMPLANT
ROD GUIDE 3.0 (MISCELLANEOUS) IMPLANT
SCREW TRIGEN LOW PROF 4.5X27.5 (Screw) IMPLANT
SCREW TRIGEN LOW PROF 4.5X32.5 (Screw) IMPLANT
SCREW TRIGEN LOW PROF 4.5X35 (Screw) IMPLANT
SCREW TRIGEN LOW PROF 4.5X37.5 (Screw) IMPLANT
SCREW TRIGEN LOW PROF 4.5X42.5 (Screw) IMPLANT
STAPLER SKIN PROX 35W (STAPLE) ×1 IMPLANT
SUCTION FRAZIER HANDLE 10FR (MISCELLANEOUS) ×1
SUCTION TUBE FRAZIER 10FR DISP (MISCELLANEOUS) ×1 IMPLANT
SUT VIC AB 0 CT1 36 (SUTURE) ×2 IMPLANT
SUT VIC AB 2-0 CT1 (SUTURE) ×1 IMPLANT
SUT VIC AB 2-0 CT1 27 (SUTURE) ×1
SUT VIC AB 2-0 CT1 TAPERPNT 27 (SUTURE) ×1 IMPLANT
SUT VICRYL 0 AB UR-6 (SUTURE) ×1 IMPLANT
SYR 30ML LL (SYRINGE) ×1 IMPLANT
TAPE PAPER 1/2X10 TAN MEDIPORE (MISCELLANEOUS) ×1 IMPLANT
TRAP FLUID SMOKE EVACUATOR (MISCELLANEOUS) ×1 IMPLANT
TRAY FOLEY SLVR 16FR LF STAT (SET/KITS/TRAYS/PACK) ×1 IMPLANT
WATER STERILE IRR 500ML POUR (IV SOLUTION) ×1 IMPLANT

## 2021-10-26 NOTE — Transfer of Care (Addendum)
Immediate Anesthesia Transfer of Care Note  Patient: Vanessa Baker  Procedure(s) Performed: INTRAMEDULLARY (IM) NAIL TIBIAL (Right)  Patient Location: PACU  Anesthesia Type:General  Level of Consciousness: drowsy  Airway & Oxygen Therapy: Patient Spontanous Breathing and Patient connected to face mask oxygen  Post-op Assessment: Report given to RN and Post -op Vital signs reviewed and stable  Post vital signs: Reviewed and stable  Last Vitals:  Vitals Value Taken Time  BP    Temp    Pulse    Resp    SpO2      Last Pain:  Vitals:   10/26/21 1522  TempSrc: Oral  PainSc: 10-Worst pain ever         Complications: No notable events documented.

## 2021-10-26 NOTE — Assessment & Plan Note (Signed)
-   Currently stable -No antihypertensive agents taken as an outpatient -Heart healthy diet discussed with patient. -Follow vital signs. -Elevation in her blood pressure currently driven by pain.

## 2021-10-26 NOTE — ED Notes (Signed)
Splint applied to right leg by Lorriane Shire, RN.

## 2021-10-26 NOTE — ED Notes (Signed)
Pt states no relief from pain meds. Spoke with MD states will place another PRN order.

## 2021-10-26 NOTE — ED Notes (Signed)
Pt to be transported to OR , no questions from pt or spouse.

## 2021-10-26 NOTE — Discharge Instructions (Addendum)
Orthopedic discharge instructions: Patient has had a right tibia intramedullary nail Patient should remain non-weightbearing on the operative extremity until follow-up Keep splint clean and dry DVT prophylaxis includes 325mg  aspirin daily Follow up with Dr. Minda Meo PA, Murilo Zanette PA-C at [facility] [employer], [place] office in 10-14 days for wound check, staple removal and x-ray.

## 2021-10-26 NOTE — Consult Note (Signed)
ORTHOPAEDIC CONSULTATION  REQUESTING PHYSICIAN: Mansy, Arvella Merles, MD  Chief Complaint: Right leg pain  HPI: Vanessa Baker is a 46 y.o. female who complains of right leg pain after mechanical fall. The pain is sharp in character. The pain is worse with movement and better with rest. Denies any numbness, tingling or constitutional symptoms.  X-rays in the ER showed a displaced right distal tibial/fibular shaft fracture. Patient was admitted to the hospitalist service.  The patient was consulted regarding further management.  Past medical history significant of bipolar disorder, hypertension, hyperlipidemia, gastroesophageal reflux disease, class I obesity, type 2 diabetes mellitus with hyperlipidemia, asthma/COPD and tobacco abuse.    Past Medical History:  Diagnosis Date   Allergy    Anxiety    Asthma    Bipolar disorder ([place])    Bursitis    Depression    Diabetes mellitus without complication ([place])    Essential hypertension 03/28/2017   Frequent headaches    GERD (gastroesophageal reflux disease)    Headache    High cholesterol    Hypertension    Irritable bowel syndrome (IBS)    Irritable colon 02/23/2010   Migraine 04/02/2017   Obesity (BMI 30-39.9) 11/25/2013   Seizure ([place]) [id number]   Seizures ([place])    Tremor    Past Surgical History:  Procedure Laterality Date   CHOLECYSTECTOMY N/A 04/27/2019   Procedure: LAPAROSCOPIC CHOLECYSTECTOMY;  Surgeon: Fredirick Maudlin, MD;  Location: [facility] ORS;  Service: General;  Laterality: N/A;   Social History   Socioeconomic History   Marital status: Married    Spouse name: dewey    Number of children: 1   Years of education: 14   Highest education level: Associate degree: occupational, Hotel manager, or vocational program  Occupational History   Occupation: disability  Tobacco Use   Smoking status: Former    Packs/day: 0.50    Years: 27.00    Total pack years: 13.50    Types: Cigarettes    Quit date: 04/26/2019    Years  since quitting: 2.5   Smokeless tobacco: Never  Vaping Use   Vaping Use: Former  Substance and Sexual Activity   Alcohol use: Yes   Drug use: No   Sexual activity: Yes    Partners: Male    Birth control/protection: None  Other Topics Concern   Not on file  Social History Narrative   Lives with husband, [name] (on [place])   Right-handed   Caffeine use: 1 cup coffee a day, 2 soft drinks per day   Social Determinants of Health   Financial Resource Strain: Medium Risk (04/07/2021)   Overall Financial Resource Strain (CARDIA)    Difficulty of Paying Living Expenses: Somewhat hard  Food Insecurity: No Food Insecurity (01/16/2021)   Hunger Vital Sign    Worried About Running Out of Food in the Last Year: Never true    [place] in the Last Year: Never true  Transportation Needs: No Transportation Needs (01/16/2021)   PRAPARE - Hydrologist (Medical): No    Lack of Transportation (Non-Medical): No  Physical Activity: Insufficiently Active (01/16/2021)   Exercise Vital Sign    Days of Exercise per Week: 2 days    Minutes of Exercise per Session: 20 min  Stress: Stress Concern Present (01/16/2021)   [place]    Feeling of Stress : Rather much  Social Connections: Moderately Isolated (01/16/2021)   Social Connection and  Isolation Panel [NHANES]    Frequency of Communication with Friends and Family: Twice a week    Frequency of Social Gatherings with Friends and Family: Twice a week    Attends Religious Services: Never    Marine scientist or Organizations: No    Attends Music therapist: Never    Marital Status: Married   Family History  Problem Relation Age of Onset   Bipolar disorder Mother    Schizophrenia Mother    Hypertension Mother    Diabetes Mother    Cancer Mother    Anxiety disorder Mother    ADD / ADHD Mother    Alcohol abuse Mother     Drug abuse Mother    Heart disease Mother    Bipolar disorder Sister    Anxiety disorder Sister    Drug abuse Sister    Bipolar disorder Brother    Anxiety disorder Brother    Drug abuse Brother    Hypertension Father    Cancer Father    Diabetes Father    Breast cancer Maternal Aunt    Leukemia Maternal Aunt    Breast cancer Paternal Aunt    Breast cancer Maternal Grandmother    Alcohol abuse Maternal Grandmother    Bipolar disorder Maternal Grandmother    Breast cancer Paternal Grandmother    Parkinson's disease Paternal Grandmother    Alcohol abuse Maternal Grandfather    Prostate cancer Maternal Grandfather    Alcohol abuse Maternal Uncle    Multiple myeloma Maternal Uncle    ADD / ADHD Son    Throat cancer Maternal Aunt    Tremor Neg Hx    Allergies  Allergen Reactions   Amitriptyline Other (See Comments)    Confusion, paralysis   Wellbutrin [Bupropion] Nausea Only    falls   Prior to Admission medications   Medication Sig Start Date End Date Taking? Authorizing Provider  albuterol (VENTOLIN HFA) 108 (90 Base) MCG/ACT inhaler INHALE 2 PUFFS BY MOUTH EVERY 6 HOURS AS NEEDED FOR WHEEZING AND FOR SHORTNESS OF BREATH 07/31/21  Yes Cannady, Jolene T, NP  atorvastatin (LIPITOR) 40 MG tablet Take 1 tablet (40 mg total) by mouth daily. 02/24/21  Yes Cannady, Jolene T, NP  baclofen (LIORESAL) 10 MG tablet Take 1 tablet (10 mg total) by mouth 3 (three) times daily as needed. for muscle spams 02/24/21  Yes Cannady, Jolene T, NP  benztropine (COGENTIN) 1 MG tablet Take 1 mg by mouth at bedtime.   Yes [provider]  carbamazepine (TEGRETOL) 200 MG tablet Take 1 tablet daily by mouth AM and 1.5 tablets by mouth daily PM. 02/24/21  Yes Cannady, Jolene T, NP  fluticasone furoate-vilanterol (BREO ELLIPTA) 100-25 MCG/ACT AEPB Inhale 1 puff into the lungs daily. 07/31/21  Yes Cannady, Jolene T, NP  gabapentin (NEURONTIN) 600 MG tablet Take 600 mg by mouth See admin instructions.  Take 600 MG (one tablet) by mouth at 6 pm and 1200 MG (two tablets) by mouth at bedtime.   Yes [provider]  lisinopril (ZESTRIL) 5 MG tablet Take 5 mg by mouth daily. 08/18/21  Yes [provider]  Methylcobalamin (B-12) 1000 MCG TBDP Take 1,000 mcg by mouth daily. 08/23/20  Yes Cannady, Jolene T, NP  OLANZapine (ZYPREXA) 20 MG tablet Take 1 tablet (20 mg total) by mouth at bedtime. 02/24/21  Yes Cannady, Jolene T, NP  omega-3 acid ethyl esters (LOVAZA) 1 g capsule Take 1 capsule (1 g total) by mouth 2 (two)  times daily. 08/23/20  Yes Cannady, Henrine Screws T, NP  omeprazole (PRILOSEC) 20 MG capsule Take 1 capsule by mouth once daily 10/17/21  Yes Cannady, Jolene T, NP  pregabalin (LYRICA) 100 MG capsule Take 100 mg by mouth 2 (two) times daily.   Yes [provider]  primidone (MYSOLINE) 50 MG tablet TAKE 1 TABLET BY MOUTH AT BEDTIME 01/24/21  Yes Cannady, Jolene T, NP  sertraline (ZOLOFT) 50 MG tablet Take 1 tablet (50 mg total) by mouth daily. 02/24/21  Yes Cannady, Jolene T, NP  traZODone (DESYREL) 100 MG tablet Take 2 tablets (200 mg total) by mouth at bedtime. 04/10/21  Yes Cannady, Jolene T, NP  ACCU-CHEK AVIVA PLUS test strip USE 1 STRIP TO CHECK GLUCOSE UP TO 3 TIMES DAILY AS DIRECTED. 12/23/18   Parks Ranger, Devonne Doughty, DO  Accu-Chek Softclix Lancets lancets Use to check blood sugar up to 3 times daily as instructed 12/23/18   Parks Ranger, Devonne Doughty, DO  blood glucose meter kit and supplies Dispense based on patient and insurance preference. Use up to four times daily as directed. (FOR ICD-10 E10.9, E11.9). 09/12/17   Mikey College, NP  Blood Glucose Monitoring Suppl (ACCU-CHEK AVIVA PLUS) w/Device KIT Use to check blood sugar up to 3 times daily 12/23/18   Karamalegos, Devonne Doughty, DO  rOPINIRole (REQUIP) 2 MG tablet Take 1 tablet (2 mg total) by mouth at bedtime. 10/16/21   Venita Lick, NP   DG Tibia/Fibula Right  Result Date: 10/26/2021 CLINICAL DATA:   Right ankle fracture. EXAM: RIGHT ANKLE - COMPLETE 3+ VIEW; RIGHT TIBIA AND FIBULA - 2 VIEW COMPARISON:  None Available. FINDINGS: Right tibia fibula series: There is a spiral fracture of the distal tibial shaft, with the distal fragment displaced laterally by 1/2 of a shaft width with slight posterior angulation of the distal fragment. Also noted is a comminuted fracture of the distal diametaphysis of the fibula with the distal fragment displaced laterally up to 1 cortex width, posteriorly by 1/2 of a shaft width, with nondisplaced comminution fragments in between the fracture margins. There is normal bone mineralization. Arthritic changes are not seen. No joint effusion at the knee. There is swelling of the distal foreleg. Right ankle series: The lower fracture margin of the distal fibular fracture is at the level of the tibial plafond, but there is no asymmetry of the mortise. The other malleoli are intact. The talar dome contour is preserved. Arthritic changes are not seen. There is a moderate-sized plantar calcaneal spur. IMPRESSION: Acute closed distal tibial and distal fibular fractures, as detailed above. Electronically Signed   By: Telford Nab M.D.   On: 10/26/2021 05:22   DG Ankle Complete Right  Result Date: 10/26/2021 CLINICAL DATA:  Right ankle fracture. EXAM: RIGHT ANKLE - COMPLETE 3+ VIEW; RIGHT TIBIA AND FIBULA - 2 VIEW COMPARISON:  None Available. FINDINGS: Right tibia fibula series: There is a spiral fracture of the distal tibial shaft, with the distal fragment displaced laterally by 1/2 of a shaft width with slight posterior angulation of the distal fragment. Also noted is a comminuted fracture of the distal diametaphysis of the fibula with the distal fragment displaced laterally up to 1 cortex width, posteriorly by 1/2 of a shaft width, with nondisplaced comminution fragments in between the fracture margins. There is normal bone mineralization. Arthritic changes are not seen. No joint  effusion at the knee. There is swelling of the distal foreleg. Right ankle series: The lower fracture margin of the distal  fibular fracture is at the level of the tibial plafond, but there is no asymmetry of the mortise. The other malleoli are intact. The talar dome contour is preserved. Arthritic changes are not seen. There is a moderate-sized plantar calcaneal spur. IMPRESSION: Acute closed distal tibial and distal fibular fractures, as detailed above. Electronically Signed   By: Telford Nab M.D.   On: 10/26/2021 05:22    Positive ROS: All other systems have been reviewed and were otherwise negative with the exception of those mentioned in the HPI and as above.  Physical Exam: General: Alert, no acute distress Cardiovascular: No pedal edema Respiratory: No cyanosis, no use of accessory musculature GI: No organomegaly, abdomen is soft and non-tender Skin: No lesions in the area of chief complaint Neurologic: Sensation intact distally Psychiatric: Patient is competent for consent with normal mood and affect Lymphatic: No axillary or cervical lymphadenopathy  MUSCULOSKELETAL: Right lower extremity is splinted, there is mild swelling and tenderness about the distal tibia and fibula.  Compartments soft. Good cap refill. Motor and sensory intact distally.  Assessment: 46 year old female admitted with a displaced right distal tibial and fibular shaft fracture  Plan: I had a long discussion with the patient and her family regarding the nature of her fracture as well as operative and nonoperative treatment options and risks benefits of each.  Following discussion, patient elected proceed with right tibia IM nail, possible distal fibula open reduction internal fixation. We discussed risks of nonunion, malunion, infection which can be higher with tobacco use, patient was counseled to reduce tobacco use to minimize risk of perioperative complications.  Informed consent provided.     Renee Harder, MD    10/26/2021 2:55 PM

## 2021-10-26 NOTE — Assessment & Plan Note (Signed)
-   Update A1c -patient started on Scale insulin, Q6H while NPO -follow CBGs fluctuation and further adjust hypoglycemic regimen as needed.

## 2021-10-26 NOTE — Assessment & Plan Note (Signed)
-   Mood stable currently -Resume home Zoloft, trazodone olanzapine, Tegretol, and Cogentin.

## 2021-10-26 NOTE — Assessment & Plan Note (Signed)
-   No acute source of infection identified -Appears to be a stress demargination -Provide fluid resuscitation and follow WBCs trend.

## 2021-10-26 NOTE — Assessment & Plan Note (Signed)
-  Cessation counseling provided -Nicotine patch ordered.

## 2021-10-26 NOTE — ED Provider Notes (Signed)
[facility] Provider Note    Event Date/Time   First MD Initiated Contact with Patient 10/26/21 0602     (approximate)   History   Fall   HPI  Vanessa Baker is a 46 y.o. female who presents to the ED for evaluation of Fall   I review PCP visit from April. Obese patient with hx of HTN, DM, asthma, Bipolar disorder.   Patient presents to the ED by EMS from home after mechanical fall.  She was walking to the bathroom, tripped and twisted her ankle and felt a popping sensation.  No head injury or syncope.  No other pain beyond her right ankle and lower leg.  Physical Exam   Triage Vital Signs: ED Triage Vitals  Enc Vitals Group     BP 10/26/21 0410 (!) 108/59     Pulse Rate 10/26/21 0410 72     Resp 10/26/21 0410 18     Temp 10/26/21 0410 98.7 F (37.1 C)     Temp Source 10/26/21 0410 Oral     SpO2 10/26/21 0410 93 %     Weight 10/26/21 0410 182 lb 15.7 oz (83 kg)     Height 10/26/21 0410 5\' 2"  (1.575 m)     Head Circumference --      Peak Flow --      Pain Score 10/26/21 0414 8     Pain Loc --      Pain Edu? --      Excl. in [place]? --     Most recent vital signs: Vitals:   10/26/21 0410  BP: (!) 108/59  Pulse: 72  Resp: 18  Temp: 98.7 F (37.1 C)  SpO2: 93%    General: Awake, no distress.  CV:  Good peripheral perfusion.  Resp:  Normal effort.  Abd:  No distention.  MSK:  Closed formative to the right ankle and distal lower leg.  Foot is distally neurovascularly intact.  Soft calf.  No other deformity Neuro:  No focal deficits appreciated. Other:     ED Results / Procedures / Treatments   Labs (all labs ordered are listed, but only abnormal results are displayed) Labs Reviewed  CBC WITH DIFFERENTIAL/PLATELET  BASIC METABOLIC PANEL  PROTIME-INR  TYPE AND SCREEN    EKG Sinus rhythm with a rate of 65 bpm.  Normal axis and intervals.  No clear signs of acute ischemia.  RADIOLOGY Plain film of the right tib-fib and  ankle interpreted by me with displaced spiral distal tibial shaft fracture without articular involvement as well as a distal fibular fracture.  Ankle mortise spacing intact  Official radiology report(s): DG Tibia/Fibula Right  Result Date: 10/26/2021 CLINICAL DATA:  Right ankle fracture. EXAM: RIGHT ANKLE - COMPLETE 3+ VIEW; RIGHT TIBIA AND FIBULA - 2 VIEW COMPARISON:  None Available. FINDINGS: Right tibia fibula series: There is a spiral fracture of the distal tibial shaft, with the distal fragment displaced laterally by 1/2 of a shaft width with slight posterior angulation of the distal fragment. Also noted is a comminuted fracture of the distal diametaphysis of the fibula with the distal fragment displaced laterally up to 1 cortex width, posteriorly by 1/2 of a shaft width, with nondisplaced comminution fragments in between the fracture margins. There is normal bone mineralization. Arthritic changes are not seen. No joint effusion at the knee. There is swelling of the distal foreleg. Right ankle series: The lower fracture margin of the distal fibular fracture is at the level  of the tibial plafond, but there is no asymmetry of the mortise. The other malleoli are intact. The talar dome contour is preserved. Arthritic changes are not seen. There is a moderate-sized plantar calcaneal spur. IMPRESSION: Acute closed distal tibial and distal fibular fractures, as detailed above. Electronically Signed   By: Telford Nab M.D.   On: 10/26/2021 05:22   DG Ankle Complete Right  Result Date: 10/26/2021 CLINICAL DATA:  Right ankle fracture. EXAM: RIGHT ANKLE - COMPLETE 3+ VIEW; RIGHT TIBIA AND FIBULA - 2 VIEW COMPARISON:  None Available. FINDINGS: Right tibia fibula series: There is a spiral fracture of the distal tibial shaft, with the distal fragment displaced laterally by 1/2 of a shaft width with slight posterior angulation of the distal fragment. Also noted is a comminuted fracture of the distal diametaphysis  of the fibula with the distal fragment displaced laterally up to 1 cortex width, posteriorly by 1/2 of a shaft width, with nondisplaced comminution fragments in between the fracture margins. There is normal bone mineralization. Arthritic changes are not seen. No joint effusion at the knee. There is swelling of the distal foreleg. Right ankle series: The lower fracture margin of the distal fibular fracture is at the level of the tibial plafond, but there is no asymmetry of the mortise. The other malleoli are intact. The talar dome contour is preserved. Arthritic changes are not seen. There is a moderate-sized plantar calcaneal spur. IMPRESSION: Acute closed distal tibial and distal fibular fractures, as detailed above. Electronically Signed   By: Telford Nab M.D.   On: 10/26/2021 05:22    PROCEDURES and INTERVENTIONS:  .Splint Application  Date/Time: 10/26/2021 6:26 AM  Performed by: Vladimir Crofts, MD Authorized by: Vladimir Crofts, MD   Consent:    Consent obtained:  Verbal   Consent given by:  Patient   Risks, benefits, and alternatives were discussed: yes   Procedure details:    Location:  Leg   Leg location:  R lower leg   Splint type:  Short leg and ankle stirrup   Supplies:  Cotton padding, fiberglass and elastic bandage   Attestation: Splint applied and adjusted personally by me   Post-procedure details:    Distal neurologic exam:  Normal   Distal perfusion: distal pulses strong and brisk capillary refill     Procedure completion:  Tolerated well, no immediate complications   Post-procedure imaging: not applicable   Reduction of fracture  Date/Time: 10/26/2021 6:27 AM  Performed by: Vladimir Crofts, MD Authorized by: Vladimir Crofts, MD  Consent: Verbal consent obtained. Risks and benefits: risks, benefits and alternatives were discussed Consent given by: patient Preparation: Patient was prepped and draped in the usual sterile fashion. Patient tolerance: patient tolerated the procedure  well with no immediate complications     Medications  fentaNYL (SUBLIMAZE) injection 50 mcg (50 mcg Intravenous Not Given 10/26/21 0614)     IMPRESSION / MDM / ASSESSMENT AND PLAN / ED COURSE  I reviewed the triage vital signs and the nursing notes.  Differential diagnosis includes, but is not limited to, fracture, dislocation, open fracture, compartment syndrome, syncope, seizure  {Patient presents with symptoms of an acute illness or injury that is potentially life-threatening.  46 year old female presents to the ED after mechanical fall with a closed right distal tib-fib fracture requiring splinting and admission to facilitate orthopedic fixation.  No signs of neurologic or vascular deficits.  Close deformity of the right distal lower leg and ankle.  X-ray confirms fracture consult Ortho who recommends  admission.  Consult with medicine who agrees to admit.  We will send preop blood work.  Clinical Course as of 10/26/21 7460  Thu Oct 26, 2021  0541 I consult with Dr. Sharlet Salina, ortho.  Recommends admission for fixation later today [DS]  0604 I consult with medicine who agrees to admit [DS]    Clinical Course User Index [DS] Vladimir Crofts, MD     FINAL CLINICAL IMPRESSION(S) / ED DIAGNOSES   Final diagnoses:  Fall, initial encounter  Tibia/fibula fracture, right, closed, initial encounter     Rx / DC Orders   ED Discharge Orders     None        Note:  This document was prepared using Dragon voice recognition software and may include unintentional dictation errors.   Vladimir Crofts, MD 10/26/21 [phone]

## 2021-10-26 NOTE — Assessment & Plan Note (Signed)
continue PPI

## 2021-10-26 NOTE — Op Note (Signed)
DATE OF SURGERY:  10/26/2021  TIME: 6:34 PM  PATIENT NAME:  Vanessa Baker  AGE: 46 y.o.  PRE-OPERATIVE DIAGNOSIS:  Right distal tibia shaft/fibular shaft fracture  POST-OPERATIVE DIAGNOSIS:  SAME  PROCEDURE: Right INTRAMEDULLARY (IM) NAIL TIBIAL  SURGEON:  Renee Harder  EBL:  174 cc  COMPLICATIONS: None  OPERATIVE IMPLANTS: [employer] Intertan femoral nail 8.5 mm x 320 mm  PREOPERATIVE INDICATIONS:  Vanessa Baker is a 46 y.o. year old who fell and suffered a displaced right tibial shaft/fibular shaft fracture. She was brought into the ER and then admitted and optimized and then elected for surgical intervention.    The risks benefits and alternatives were discussed with the patient including but not limited to the risks of nonoperative treatment, versus surgical intervention including infection, bleeding, nerve injury, malunion, nonunion, hardware prominence, hardware failure, need for hardware removal, blood clots, cardiopulmonary complications, morbidity, mortality, among others, and they were willing to proceed.    OPERATIVE PROCEDURE:  The patient was brought to the operating room and placed in the supine position.  General anesthesia was administered.  The patient was placed supine on the OR table with all bony prominences well-padded.  A tourniquet was placed on the operative side.  Time out was then performed after sterile prep and drape. She received preoperative antibiotics.  Serum fluoroscopy was used in both AP and lateral dimensions to characterize the fracture.  Point-to-point clamp was utilized through percutaneous incisions to help provisionally reduce the fracture.  This was confirmed on AP and lateral imaging.  4 cm incision was made superior to the patella.  Dissection was carried through the quadriceps tendon.  Appropriate starting point was then selected on both AP and lateral images of the knee.  Guidewire was placed.  This was then  overreamed with the entry reamer.  Ball-tipped guidewire was placed to the distal physeal scar of the tibia.  Again appropriate reduction was checked.  Sequential reaming was then performed up to 10 mm with excellent chatter.  The above size nail was opened and impacted.  Fracture remained reduced.  Drill guide was then placed proximally.  2 screws were placed one from medial to lateral and 1 from lateral to medial through the proximal portion of the nail.  Next, perfect circle technique was utilized to place 2 distal locking screws from medial to lateral.  Final x-rays were obtained showing appropriate fracture reduction, nail placement, screw length and trajectory.  Distal fibular shaft fracture was noted to be out to length and overall appropriately reduced.  Tourniquet was let down and hemostasis was obtained.  Wounds were thoroughly irrigated and closed in layered fashion with 0 Vicryl followed by 2-0 Vicryl followed by staples.  Sterile dressing was applied followed by well-padded short leg splint.   Sponge and needle count were correct.   The patient was awakened and returned to PACU in stable and satisfactory condition. There no complications and the patient tolerated the procedure well.   POSTOPERATIVE PLAN: She will be non-weightbearing on the operative extremity. Ok to start DVT ppx POD#1-aspirin 325 mg daily. Keep splint clean and dry until follow-up. Outpatient f/u in clinic in 2 weeks for staple removal and xrays  Renee Harder

## 2021-10-26 NOTE — Anesthesia Procedure Notes (Signed)
Procedure Name: Intubation Date/Time: 10/26/2021 4:35 PM  Performed by: Beverely Low, CRNAPre-anesthesia Checklist: Patient identified, Patient being monitored, Timeout performed, Emergency Drugs available and Suction available Patient Re-evaluated:Patient Re-evaluated prior to induction Oxygen Delivery Method: Circle system utilized Preoxygenation: Pre-oxygenation with 100% oxygen Induction Type: IV induction Ventilation: Mask ventilation without difficulty Laryngoscope Size: 3 and McGraph Grade View: Grade I Tube type: Oral Tube size: 6.5 mm Number of attempts: 1 Airway Equipment and Method: Stylet Placement Confirmation: ETT inserted through vocal cords under direct vision, positive ETCO2 and breath sounds checked- equal and bilateral Secured at: 21 cm Tube secured with: Tape Dental Injury: Teeth and Oropharynx as per pre-operative assessment

## 2021-10-26 NOTE — ED Notes (Signed)
Since assuming care at [place] spoke with MD about pain control, surgical time, and updates. MD to bedside. Pt and spouse had all questions answered with no further needs. Per MD will give PO pain meds first hour after PRN Morphine for better control. Pt again agreeable to plan. Will monitor.

## 2021-10-26 NOTE — Assessment & Plan Note (Signed)
-  Body mass index is 33.47 kg/m. -Low calorie diet, portion control and increase physical activity discussed with patient.

## 2021-10-26 NOTE — Assessment & Plan Note (Signed)
-   Heart healthy diet discussed with patient -Continue the use of Lipitor.

## 2021-10-26 NOTE — ED Triage Notes (Addendum)
Pt presents via EMS from home following a fall and notable deformity to the right ankle. Pt states she was walking to the bathroom and tripped and fell landing on her ankle and heard a loud "pop". Pt received 19mcg of Fentanyl by EMS PTA and has her ankle in a brace by EMS. Pt rates the pain an 8/10 but "the pain is better since the meds." Denies hitting her head, LOC, nor on blood thinners.

## 2021-10-26 NOTE — Assessment & Plan Note (Signed)
-   Mechanical fall triggering acute closed distal tibial and distal fibular fractures. -Patient complaining of excruciating pain. -Orthopedic service has been consulted with recommendation to keep patient n.p.o. for surgical fixation later today. -Short leg and ankle stirrup splint placed in ED -will provide analgesic therapy. -follow ortho recommendations.

## 2021-10-26 NOTE — Assessment & Plan Note (Signed)
-   Continue the use of Requip.

## 2021-10-26 NOTE — Progress Notes (Signed)
Called and informed dr crawford that swelling in pts knee is increasing and pt feels like splint is getting tight.  He informed it is ok to loosen ace wrap over splint.  Toes color is WNL and warm. No paresthesia.  Ice on knee.

## 2021-10-26 NOTE — ED Notes (Signed)
Spoke with Agricultural consultant Will in the OR and advised pt declined to sign consent in ED.

## 2021-10-26 NOTE — H&P (Signed)
History and Physical    Patient: Vanessa Baker OMV:[id number] DOB: [birth year] DOA: 10/26/2021 DOS: the patient was seen and examined on 10/26/2021 PCP: Venita Lick, NP  Patient coming from: Home  Chief Complaint:  Chief Complaint  Patient presents with   Fall   HPI: Vanessa Baker is a 46 y.o. female with medical history significant of bipolar disorder, hypertension, hyperlipidemia, gastroesophageal reflux disease, class I obesity, type 2 diabetes mellitus with hyperlipidemia, asthma/COPD and tobacco abuse; presented to the hospital secondary to mechanical fall Triggering right leg pain and inability to bear weight. Patient reports tripping and falling; no chest pain, no palpitations, no dizziness or lightheadedness symptoms.  She also reports no nausea, no vomiting, no dysuria, no hematuria, no melena, no hematochezia, no productive cough or sick contacts.    Work-up in the ED demonstrating acute close distal tibia and distal fibular fracture.  Pain medication and splint provided; orthopedic doctors consulted with recommendation for admission for surgical stabilization. [facility] called to assist with placement and further management.  Review of Systems: As mentioned in the history of present illness. All other systems reviewed and are negative. Past Medical History:  Diagnosis Date   Allergy    Anxiety    Asthma    Bipolar disorder ([place])    Bursitis    Depression    Diabetes mellitus without complication ([place])    Essential hypertension 03/28/2017   Frequent headaches    GERD (gastroesophageal reflux disease)    Headache    High cholesterol    Hypertension    Irritable bowel syndrome (IBS)    Irritable colon 02/23/2010   Migraine 04/02/2017   Obesity (BMI 30-39.9) 11/25/2013   Seizure ([place]) [id number]   Seizures ([place])    Tremor    Past Surgical History:  Procedure Laterality Date   CHOLECYSTECTOMY N/A 04/27/2019   Procedure: LAPAROSCOPIC CHOLECYSTECTOMY;   Surgeon: Fredirick Maudlin, MD;  Location: [facility] ORS;  Service: General;  Laterality: N/A;   Social History:  reports that she quit smoking about 2 years ago. Her smoking use included cigarettes. She has a 13.50 pack-year smoking history. She has never used smokeless tobacco. She reports current alcohol use. She reports that she does not use drugs.  Allergies  Allergen Reactions   Amitriptyline Other (See Comments)    Confusion, paralysis   Wellbutrin [Bupropion] Nausea Only    falls    Family History  Problem Relation Age of Onset   Bipolar disorder Mother    Schizophrenia Mother    Hypertension Mother    Diabetes Mother    Cancer Mother    Anxiety disorder Mother    ADD / ADHD Mother    Alcohol abuse Mother    Drug abuse Mother    Heart disease Mother    Bipolar disorder Sister    Anxiety disorder Sister    Drug abuse Sister    Bipolar disorder Brother    Anxiety disorder Brother    Drug abuse Brother    Hypertension Father    Cancer Father    Diabetes Father    Breast cancer Maternal Aunt    Leukemia Maternal Aunt    Breast cancer Paternal Aunt    Breast cancer Maternal Grandmother    Alcohol abuse Maternal Grandmother    Bipolar disorder Maternal Grandmother    Breast cancer Paternal Grandmother    Parkinson's disease Paternal Grandmother    Alcohol abuse Maternal Grandfather    Prostate cancer Maternal Grandfather    Alcohol  abuse Maternal Uncle    Multiple myeloma Maternal Uncle    ADD / ADHD Son    Throat cancer Maternal Aunt    Tremor Neg Hx     Prior to Admission medications   Medication Sig Start Date End Date Taking? Authorizing Provider  albuterol (VENTOLIN HFA) 108 (90 Base) MCG/ACT inhaler INHALE 2 PUFFS BY MOUTH EVERY 6 HOURS AS NEEDED FOR WHEEZING AND FOR SHORTNESS OF BREATH 07/31/21  Yes Cannady, Jolene T, NP  atorvastatin (LIPITOR) 40 MG tablet Take 1 tablet (40 mg total) by mouth daily. 02/24/21  Yes Cannady, Jolene T, NP  baclofen (LIORESAL) 10 MG  tablet Take 1 tablet (10 mg total) by mouth 3 (three) times daily as needed. for muscle spams 02/24/21  Yes Cannady, Jolene T, NP  benztropine (COGENTIN) 1 MG tablet Take 1 mg by mouth at bedtime.   Yes [provider]  carbamazepine (TEGRETOL) 200 MG tablet Take 1 tablet daily by mouth AM and 1.5 tablets by mouth daily PM. 02/24/21  Yes Cannady, Jolene T, NP  fluticasone furoate-vilanterol (BREO ELLIPTA) 100-25 MCG/ACT AEPB Inhale 1 puff into the lungs daily. 07/31/21  Yes Cannady, Jolene T, NP  gabapentin (NEURONTIN) 600 MG tablet Take 600 mg by mouth See admin instructions. Take 600 MG (one tablet) by mouth at 6 pm and 1200 MG (two tablets) by mouth at bedtime.   Yes [provider]  lisinopril (ZESTRIL) 5 MG tablet Take 5 mg by mouth daily. 08/18/21  Yes [provider]  Methylcobalamin (B-12) 1000 MCG TBDP Take 1,000 mcg by mouth daily. 08/23/20  Yes Cannady, Jolene T, NP  OLANZapine (ZYPREXA) 20 MG tablet Take 1 tablet (20 mg total) by mouth at bedtime. 02/24/21  Yes Cannady, Jolene T, NP  omega-3 acid ethyl esters (LOVAZA) 1 g capsule Take 1 capsule (1 g total) by mouth 2 (two) times daily. 08/23/20  Yes Cannady, Henrine Screws T, NP  omeprazole (PRILOSEC) 20 MG capsule Take 1 capsule by mouth once daily 10/17/21  Yes Cannady, Jolene T, NP  pregabalin (LYRICA) 100 MG capsule Take 100 mg by mouth 2 (two) times daily.   Yes [provider]  primidone (MYSOLINE) 50 MG tablet TAKE 1 TABLET BY MOUTH AT BEDTIME 01/24/21  Yes Cannady, Jolene T, NP  sertraline (ZOLOFT) 50 MG tablet Take 1 tablet (50 mg total) by mouth daily. 02/24/21  Yes Cannady, Jolene T, NP  traZODone (DESYREL) 100 MG tablet Take 2 tablets (200 mg total) by mouth at bedtime. 04/10/21  Yes Cannady, Jolene T, NP  ACCU-CHEK AVIVA PLUS test strip USE 1 STRIP TO CHECK GLUCOSE UP TO 3 TIMES DAILY AS DIRECTED. 12/23/18   Parks Ranger, Devonne Doughty, DO  Accu-Chek Softclix Lancets lancets Use to check blood sugar up to 3 times  daily as instructed 12/23/18   Parks Ranger, Devonne Doughty, DO  blood glucose meter kit and supplies Dispense based on patient and insurance preference. Use up to four times daily as directed. (FOR ICD-10 E10.9, E11.9). 09/12/17   Mikey College, NP  Blood Glucose Monitoring Suppl (ACCU-CHEK AVIVA PLUS) w/Device KIT Use to check blood sugar up to 3 times daily 12/23/18   Karamalegos, Devonne Doughty, DO  rOPINIRole (REQUIP) 2 MG tablet Take 1 tablet (2 mg total) by mouth at bedtime. 10/16/21   Venita Lick, NP    Physical Exam: Vitals:   10/26/21 0410 10/26/21 0900  BP: (!) 108/59 (!) 148/78  Pulse: 72 67  Resp: 18 18  Temp: 98.7 F (  37.1 C)   TempSrc: Oral   SpO2: 93% 92%  Weight: 83 kg   Height: _0  (1.575 m)    General exam: Alert, awake, oriented x 3; no chest pain, no nausea or vomiting.  Complaining of excruciating pain in her lower extremity. Respiratory system: Good air movement bilaterally; slight expiratory wheezing appreciated on exam.  No using accessory muscle.  Good saturation on room Cardiovascular system:RRR. No murmurs, rubs, gallops.  No JVD. Gastrointestinal system: Abdomen is obese, nondistended, soft and nontender. No organomegaly or masses felt. Normal bowel sounds heard. Central nervous system: Alert and oriented. No focal neurological deficits. Extremities: No cyanosis or clubbing; right lower leg with shorter splint in place.  Patient reporting significant pain.  Positive pulses appreciated.   Skin: No petechiae. Psychiatry: Judgement and insight appear normal. Mood & affect appropriate.   Data Reviewed: CBC: WBC 12.6, hemoglobin 15.3, platelet count 948 K Basic metabolic panel: Sodium 546, potassium 4.1, chloride 105, bicarb 26, BUN 12, creatinine 0.59, GFR more than 60; anion gap 8. PT/INR: 13.6/1.1  Assessment and Plan: * Tibia/fibula fracture, right, closed, initial encounter - Mechanical fall triggering acute closed distal tibial and distal fibular  fractures. -Patient complaining of excruciating pain. -Orthopedic service has been consulted with recommendation to keep patient n.p.o. for surgical fixation later today. -Short leg and ankle stirrup splint placed in ED -will provide analgesic therapy. -follow ortho recommendations.  Leukocytosis - No acute source of infection identified -Appears to be a stress demargination -Provide fluid resuscitation and follow WBCs trend.  Diabetic neuropathy ([place]) -continue home lyrica and gabapentin.  Tobacco abuse -Cessation counseling provided -Nicotine patch ordered.  Bipolar 1 disorder, mixed, moderate ([facility]) - Mood stable currently -Resume home Zoloft, trazodone olanzapine, Tegretol, and Cogentin.  Gastroesophageal reflux disease -continue PPI  Class 1 obesity -Body mass index is 33.47 kg/m. -Low calorie diet, portion control and increase physical activity discussed with patient.  Type 2 diabetes mellitus with obesity ([facility]) - Update A1c -patient started on Scale insulin, Q6H while NPO -follow CBGs fluctuation and further adjust hypoglycemic regimen as needed.  Restless leg syndrome - Continue the use of Requip.  Moderate persistent asthma without complication -Wheezing appreciated on examination -No using accessory muscles -Good saturation on the use of Breo Ellipta and as needed bronchodilator management.  Hypertension associated with diabetes ([place]) - Currently stable -No antihypertensive agents taken as an outpatient -Heart healthy diet discussed with patient. -Follow vital signs. -Elevation in her blood pressure currently driven by pain.  Hyperlipidemia associated with type 2 diabetes mellitus ([place]) - Heart healthy diet discussed with patient -Continue the use of Lipitor.     Advance Care Planning:   Code Status: Full Code   Consults: Orthopedic service  Family Communication: Husband at bedside.  Severity of Illness: The appropriate patient status for this  patient is INPATIENT. Inpatient status is judged to be reasonable and necessary in order to provide the required intensity of service to ensure the patient's safety. The patient's presenting symptoms, physical exam findings, and initial radiographic and laboratory data in the context of their chronic comorbidities is felt to place them at high risk for further clinical deterioration. Furthermore, it is not anticipated that the patient will be medically stable for discharge from the hospital within 2 midnights of admission.   * I certify that at the point of admission it is my clinical judgment that the patient will require inpatient hospital care spanning beyond 2 midnights from the point of admission due to  high intensity of service, high risk for further deterioration and high frequency of surveillance required.*  Author: Barton Dubois, MD 10/26/2021 10:19 AM  For on call review [email or web address].

## 2021-10-26 NOTE — Anesthesia Preprocedure Evaluation (Addendum)
Anesthesia Evaluation  Patient identified by MRN, date of birth, ID band Patient awake    Reviewed: Allergy & Precautions, NPO status , Patient's Chart, lab work & pertinent test results  Airway Mallampati: III  TM Distance: >3 FB Neck ROM: full    Dental  (+) Edentulous Upper, Edentulous Lower   Pulmonary COPD,  COPD inhaler, Patient abstained from smoking., former smoker,    Pulmonary exam normal        Cardiovascular Exercise Tolerance: Good hypertension, Normal cardiovascular exam     Neuro/Psych  Headaches, Seizures -, Well Controlled,  PSYCHIATRIC DISORDERS Anxiety Depression Bipolar Disorder Tremor in hands    GI/Hepatic Neg liver ROS, GERD  Controlled and Medicated,  Endo/Other  diabetes, Well Controlled, Type 2  Renal/GU      Musculoskeletal   Abdominal (+) + obese,   Peds  Hematology negative hematology ROS (+)   Anesthesia Other Findings Tibia/fibula fracture, right  Past Medical History: No date: Allergy No date: Anxiety No date: Asthma No date: Bipolar disorder ([place]) No date: Bursitis No date: Depression No date: Diabetes mellitus without complication ([place]) 11/03/2444: Essential hypertension No date: Frequent headaches No date: GERD (gastroesophageal reflux disease) No date: Headache No date: High cholesterol No date: Hypertension No date: Irritable bowel syndrome (IBS) 02/23/2010: Irritable colon 04/02/2017: Migraine 11/25/2013: Obesity (BMI 30-39.9) y-1986: Seizure ([place]) No date: Seizures ([place]) No date: Tremor  Past Surgical History: 04/27/2019: CHOLECYSTECTOMY; N/A     Comment:  Procedure: LAPAROSCOPIC CHOLECYSTECTOMY;  Surgeon:               Fredirick Maudlin, MD;  Location: [facility] ORS;  Service:               General;  Laterality: N/A;  BMI    Body Mass Index: 32.92 kg/m      Reproductive/Obstetrics negative OB ROS                          Anesthesia  Physical Anesthesia Plan  ASA: 2  Anesthesia Plan: General ETT   Post-op Pain Management: Toradol IV (intra-op)* and Ofirmev IV (intra-op)*   Induction: Intravenous  PONV Risk Score and Plan: 3 and Ondansetron, Dexamethasone and Midazolam  Airway Management Planned: Oral ETT  Additional Equipment:   Intra-op Plan:   Post-operative Plan: Extubation in OR  Informed Consent: I have reviewed the patients History and Physical, chart, labs and discussed the procedure including the risks, benefits and alternatives for the proposed anesthesia with the patient or authorized representative who has indicated his/her understanding and acceptance.     Dental advisory given  Plan Discussed with: Anesthesiologist, CRNA and Surgeon  Anesthesia Plan Comments: (Consented for post-op regional anesthesia prn)      Anesthesia Quick Evaluation

## 2021-10-26 NOTE — Assessment & Plan Note (Signed)
-  Wheezing appreciated on examination -No using accessory muscles -Good saturation on the use of Breo Ellipta and as needed bronchodilator management.

## 2021-10-26 NOTE — Assessment & Plan Note (Signed)
-  continue home lyrica and gabapentin.

## 2021-10-26 NOTE — ED Notes (Signed)
Spoke with Devin in pharmacy. States ok to admin dilaudid 2 mg at this time.

## 2021-10-26 NOTE — Anesthesia Postprocedure Evaluation (Signed)
Anesthesia Post Note  Patient: Statistician  Procedure(s) Performed: INTRAMEDULLARY (IM) NAIL TIBIAL (Right)  Patient location during evaluation: PACU Anesthesia Type: General Level of consciousness: awake and alert Pain management: pain level controlled Vital Signs Assessment: post-procedure vital signs reviewed and stable Respiratory status: spontaneous breathing, nonlabored ventilation and respiratory function stable Cardiovascular status: blood pressure returned to baseline and stable Postop Assessment: no apparent nausea or vomiting Anesthetic complications: no   No notable events documented.   Last Vitals:  Vitals:   10/26/21 2015 10/26/21 2030  BP: 126/84   Pulse: 87   Resp: 18   Temp: (!) 36.3 C 37 C  SpO2: 96%     Last Pain:  Vitals:   10/26/21 2015  TempSrc:   PainSc: 0-No pain                 [place] Ouch

## 2021-10-27 DIAGNOSIS — S82401A Unspecified fracture of shaft of right fibula, initial encounter for closed fracture: Secondary | ICD-10-CM | POA: Diagnosis not present

## 2021-10-27 DIAGNOSIS — S82201A Unspecified fracture of shaft of right tibia, initial encounter for closed fracture: Secondary | ICD-10-CM | POA: Diagnosis not present

## 2021-10-27 LAB — GLUCOSE, CAPILLARY
Glucose-Capillary: 138 mg/dL — ABNORMAL HIGH (ref 70–99)
Glucose-Capillary: 150 mg/dL — ABNORMAL HIGH (ref 70–99)
Glucose-Capillary: 230 mg/dL — ABNORMAL HIGH (ref 70–99)
Glucose-Capillary: 182 mg/dL — ABNORMAL HIGH (ref 70–99)
Glucose-Capillary: 183 mg/dL — ABNORMAL HIGH (ref 70–99)

## 2021-10-27 LAB — BASIC METABOLIC PANEL
Anion gap: 9 (ref 5–15)
BUN: 8 mg/dL (ref 6–20)
CO2: 24 mmol/L (ref 22–32)
Calcium: 8.2 mg/dL — ABNORMAL LOW (ref 8.9–10.3)
Chloride: 105 mmol/L (ref 98–111)
Creatinine, Ser: 0.59 mg/dL (ref 0.44–1.00)
GFR, Estimated: >60 mL/min (ref >60)
Glucose, Bld: 166 mg/dL — ABNORMAL HIGH (ref 70–99)
Potassium: 4.1 mmol/L (ref 3.5–5.1)
Sodium: 138 mmol/L (ref 135–145)

## 2021-10-27 LAB — CBC
HCT: 42.6 % (ref 36.0–46.0)
Hemoglobin: 13.8 g/dL (ref 12.0–15.0)
MCH: 30 pg (ref 26.0–34.0)
MCHC: 32.4 g/dL (ref 30.0–36.0)
MCV: 92.6 fL (ref 80.0–100.0)
Platelets: 256 10*3/uL (ref 150–400)
RBC: 4.6 MIL/uL (ref 3.87–5.11)
RDW: 12.8 % (ref 11.5–15.5)
WBC: 12.7 10*3/uL — ABNORMAL HIGH (ref 4.0–10.5)
nRBC: 0 % (ref 0.0–0.2)

## 2021-10-27 MED ORDER — KETAMINE HCL 10 MG/ML IJ SOLN
INTRAMUSCULAR | Status: DC | PRN
  Administered 2021-10-26: 50 mg via INTRAVENOUS

## 2021-10-27 MED ORDER — INSULIN GLARGINE-YFGN 100 UNIT/ML ~~LOC~~ SOLN
5.0000 [IU] | Freq: Every day | SUBCUTANEOUS | Status: DC
  Administered 2021-10-27: 5 [IU] via SUBCUTANEOUS
  Filled 2021-10-27: qty 0.05

## 2021-10-27 NOTE — Progress Notes (Addendum)
Spoke with the patient to discuss DC plan and needs She lives at home with her husband She will be using her dads DME and does not want additional She will like to have HHPT I contacted [facility] to ask if they can accept the patient They will run it and let me know Her husband will transport home  [facility] is not able to accept the patient I sent to [facility] they accepted the patient

## 2021-10-27 NOTE — Progress Notes (Signed)
PROGRESS NOTE    Vanessa Baker  CWC:[id number] DOB: 04-15-1975 DOA: 10/26/2021 PCP: Venita Lick, NP    Brief Narrative:  46 y.o. female with medical history significant of bipolar disorder, hypertension, hyperlipidemia, gastroesophageal reflux disease, class I obesity, type 2 diabetes mellitus with hyperlipidemia, asthma/COPD and tobacco abuse; presented to the hospital secondary to mechanical fall Triggering right leg pain and inability to bear weight. Patient reports tripping and falling; no chest pain, no palpitations, no dizziness or lightheadedness symptoms.  She also reports no nausea, no vomiting, no dysuria, no hematuria, no melena, no hematochezia, no productive cough or sick contacts.     Work-up in the ED demonstrating acute close distal tibia and distal fibular fracture.   Pain medication and splint provided; orthopedic doctors consulted with recommendation for admission for surgical stabilization. TR[facility] called to assist with placement and further management.  Status post operative fixation in OR with orthopedics on 9/21.  Tolerated procedure well.  No immediate complications.  Postoperative pain mild.   Assessment & Plan:   Principal Problem:   Tibia/fibula fracture, right, closed, initial encounter Active Problems:   Hyperlipidemia associated with type 2 diabetes mellitus ([place])   Hypertension associated with diabetes ([place])   Moderate persistent asthma without complication   Restless leg syndrome   Type 2 diabetes mellitus with obesity (HCC)   Class 1 obesity   Gastroesophageal reflux disease   Bipolar 1 disorder, mixed, moderate (HCC)   Tobacco abuse   Diabetic neuropathy (HCC)   Leukocytosis  * Tibia/fibula fracture, right, closed, initial encounter - Mechanical fall triggering acute closed distal tibial and distal fibular fractures. -Patient complaining of excruciating pain. Status post operative fixation 9/21 Tolerated procedure well Post  operative pain mild Plan: DVT prophylaxis aspirin 325 mg daily Multimodal pain control Nonweightbearing right lower extremity Therapy as tolerated Anticipate home with home health 9/23    Leukocytosis - No acute source of infection identified -Appears to be a stress demargination No indication for antibiotics   Diabetic neuropathy ([place]) -continue home lyrica and gabapentin.   Tobacco abuse -Cessation counseling provided -Nicotine patch ordered.   Bipolar 1 disorder, mixed, moderate (HCC) - Mood stable currently -Resume home Zoloft, trazodone olanzapine, Tegretol, and Cogentin.   Gastroesophageal reflux disease -continue PPI   Class 1 obesity -Body mass index is 33.47 kg/m. -Low calorie diet, portion control and increase physical activity discussed with patient.   Type 2 diabetes mellitus with obesity (HCC) Hemoglobin A1c 7.0 Semglee 5 units daily Moderate sliding scale Carb modified diet  Restless leg syndrome PTA Requip.   Moderate persistent asthma without complication -No wheezing appreciated on examination -No using accessory muscles -Good saturation on the use of Breo Ellipta and as needed bronchodilator management.   Hypertension associated with diabetes ([place]) - Currently stable -No antihypertensive agents taken as an outpatient -Heart healthy diet discussed with patient. -Follow vital signs. -Elevation in her blood pressure currently driven by pain.   Hyperlipidemia associated with type 2 diabetes mellitus ([place]) - Heart healthy diet discussed with patient -Continue the use of Lipitor.   DVT prophylaxis: Aspirin 325 mg daily Code Status: Full Family Communication: Spouse at bedside 9/22 Disposition Plan: Status is: Inpatient Remains inpatient appropriate because: Pain control status post operative fixation of tibia-fibula fracture   Level of care: Med-Surg  Consultants:  Orthopedics  Procedures:  Tib-fib fracture repair  9/21  Antimicrobials: None   Subjective: Seen and examined peer resting comfortably in bed.  No visible distress.  Pain well  controlled.  Objective: Vitals:   10/26/21 2256 10/27/21 0002 10/27/21 0518 10/27/21 0847  BP: 125/64 (!) 113/59 (!) 154/76 128/68  Pulse: 96 76 94 72  Resp: 18 20 19 17   Temp: 98.8 F (37.1 C) 98.2 F (36.8 C) 98.3 F (36.8 C) 98.7 F (37.1 C)  TempSrc: Oral  Oral Oral  SpO2: 96% 97% 94% 98%  Weight:      Height:        Intake/Output Summary (Last 24 hours) at 10/27/2021 1452 Last data filed at 10/27/2021 1242 Gross per 24 hour  Intake 700 ml  Output 1621 ml  Net -921 ml   Filed Weights   10/26/21 0410 10/26/21 1522  Weight: 83 kg 81.6 kg    Examination:  General exam: Appears calm and comfortable  Respiratory system: Breath sounds.  Normal work of breathing.  Room air Cardiovascular system: S1-S2, RRR, no murmurs, no pedal edema Gastrointestinal system: Soft, NT/ND, normal bowel sounds Central nervous system: Alert and oriented. No focal neurological deficits. Extremities: Lower extremity in surgical wraps, immobilizer, not removed Skin: No rashes, lesions or ulcers Psychiatry: Judgement and insight appear normal. Mood & affect appropriate.     Data Reviewed: I have personally reviewed following labs and imaging studies  CBC: Recent Labs  Lab 10/26/21 0549 10/27/21 0536  WBC 12.6* 12.7*  NEUTROABS 9.3*  --   HGB 15.3* 13.8  HCT 46.1* 42.6  MCV 91.7 92.6  PLT 290 263   Basic Metabolic Panel: Recent Labs  Lab 10/26/21 0549 10/27/21 0536  NA 139 138  K 4.1 4.1  CL 105 105  CO2 26 24  GLUCOSE 161* 166*  BUN 12 8  CREATININE 0.59 0.59  CALCIUM 8.8* 8.2*   GFR: Estimated Creatinine Clearance: 86 mL/min (by C-G formula based on SCr of 0.59 mg/dL). Liver Function Tests: No results for input(s): "AST", "ALT", "ALKPHOS", "BILITOT", "PROT", "ALBUMIN" in the last 168 hours. No results for input(s): "LIPASE", "AMYLASE" in  the last 168 hours. No results for input(s): "AMMONIA" in the last 168 hours. Coagulation Profile: Recent Labs  Lab 10/26/21 0549  INR 1.1   Cardiac Enzymes: No results for input(s): "CKTOTAL", "CKMB", "CKMBINDEX", "TROPONINI" in the last 168 hours. BNP (last 3 results) No results for input(s): "PROBNP" in the last 8760 hours. HbA1C: Recent Labs    10/26/21 0549  HGBA1C 7.0*   CBG: Recent Labs  Lab 10/26/21 1851 10/26/21 2026 10/26/21 2156 10/27/21 0749 10/27/21 1146  GLUCAP 176* 217* 222* 150* 230*   Lipid Profile: No results for input(s): "CHOL", "HDL", "LDLCALC", "TRIG", "CHOLHDL", "LDLDIRECT" in the last 72 hours. Thyroid Function Tests: No results for input(s): "TSH", "T4TOTAL", "FREET4", "T3FREE", "THYROIDAB" in the last 72 hours. Anemia Panel: No results for input(s): "VITAMINB12", "FOLATE", "FERRITIN", "TIBC", "IRON", "RETICCTPCT" in the last 72 hours. Sepsis Labs: No results for input(s): "PROCALCITON", "LATICACIDVEN" in the last 168 hours.  No results found for this or any previous visit (from the past 240 hour(s)).       Radiology Studies: DG Tibia/Fibula Right  Result Date: 10/26/2021 CLINICAL DATA:  Intraoperative exam, fracture repair EXAM: RIGHT TIBIA AND FIBULA - 2 VIEW COMPARISON:  10/26/2021 FINDINGS: Ten fluoroscopic images are obtained during the performance of the procedure and are provided for interpretation only. Comminuted distal tibia and fibular fractures are noted. Placement of an intramedullary rod with proximal and distal interlocking screws within the tibia. Fibular fracture fragment remains in stable alignment. Fluoroscopy time: 1 minute 50 seconds, 4.0 mGy IMPRESSION:  1. ORIF of a comminuted distal tibial diaphyseal fracture, with near anatomic alignment. 2. Oblique distal fibular fracture, with stable alignment. Electronically Signed   By: Randa Ngo M.D.   On: 10/26/2021 18:30   DG C-Arm 1-60 Min-No Report  Result Date:  10/26/2021 Fluoroscopy was utilized by the requesting physician.  No radiographic interpretation.   DG Tibia/Fibula Right  Result Date: 10/26/2021 CLINICAL DATA:  Right ankle fracture. EXAM: RIGHT ANKLE - COMPLETE 3+ VIEW; RIGHT TIBIA AND FIBULA - 2 VIEW COMPARISON:  None Available. FINDINGS: Right tibia fibula series: There is a spiral fracture of the distal tibial shaft, with the distal fragment displaced laterally by 1/2 of a shaft width with slight posterior angulation of the distal fragment. Also noted is a comminuted fracture of the distal diametaphysis of the fibula with the distal fragment displaced laterally up to 1 cortex width, posteriorly by 1/2 of a shaft width, with nondisplaced comminution fragments in between the fracture margins. There is normal bone mineralization. Arthritic changes are not seen. No joint effusion at the knee. There is swelling of the distal foreleg. Right ankle series: The lower fracture margin of the distal fibular fracture is at the level of the tibial plafond, but there is no asymmetry of the mortise. The other malleoli are intact. The talar dome contour is preserved. Arthritic changes are not seen. There is a moderate-sized plantar calcaneal spur. IMPRESSION: Acute closed distal tibial and distal fibular fractures, as detailed above. Electronically Signed   By: Telford Nab M.D.   On: 10/26/2021 05:22   DG Ankle Complete Right  Result Date: 10/26/2021 CLINICAL DATA:  Right ankle fracture. EXAM: RIGHT ANKLE - COMPLETE 3+ VIEW; RIGHT TIBIA AND FIBULA - 2 VIEW COMPARISON:  None Available. FINDINGS: Right tibia fibula series: There is a spiral fracture of the distal tibial shaft, with the distal fragment displaced laterally by 1/2 of a shaft width with slight posterior angulation of the distal fragment. Also noted is a comminuted fracture of the distal diametaphysis of the fibula with the distal fragment displaced laterally up to 1 cortex width, posteriorly by 1/2 of a  shaft width, with nondisplaced comminution fragments in between the fracture margins. There is normal bone mineralization. Arthritic changes are not seen. No joint effusion at the knee. There is swelling of the distal foreleg. Right ankle series: The lower fracture margin of the distal fibular fracture is at the level of the tibial plafond, but there is no asymmetry of the mortise. The other malleoli are intact. The talar dome contour is preserved. Arthritic changes are not seen. There is a moderate-sized plantar calcaneal spur. IMPRESSION: Acute closed distal tibial and distal fibular fractures, as detailed above. Electronically Signed   By: Telford Nab M.D.   On: 10/26/2021 05:22        Scheduled Meds:  acetaminophen  500 mg Oral Q6H   aspirin EC  325 mg Oral Q breakfast   atorvastatin  40 mg Oral Daily   benztropine  1 mg Oral QHS   carbamazepine  200 mg Oral Daily   carbamazepine  300 mg Oral QHS   cyanocobalamin  1,000 mcg Oral Daily   docusate sodium  100 mg Oral BID   fluticasone furoate-vilanterol  1 puff Inhalation Daily   gabapentin  1,200 mg Oral QHS   gabapentin  600 mg Oral QPM   insulin aspart  0-15 Units Subcutaneous TID AC   insulin aspart  0-5 Units Subcutaneous QHS   nicotine  21  mg Transdermal Daily   OLANZapine  20 mg Oral QHS   pantoprazole  40 mg Oral Daily   primidone  50 mg Oral QHS   rOPINIRole  2 mg Oral BID   sertraline  50 mg Oral Daily   traZODone  200 mg Oral QHS   Continuous Infusions:   LOS: 1 day      Sidney Ace, MD [facility]   If 7PM-7AM, please contact night-coverage  10/27/2021, 2:52 PM

## 2021-10-27 NOTE — Evaluation (Signed)
Occupational Therapy Evaluation Patient Details Name: Vanessa Baker MRN: [id number] DOB: [birth year] Today's Date: 10/27/2021   History of Present Illness Vanessa Baker is a 46 y.o. female with medical history significant of bipolar disorder, hypertension, hyperlipidemia, gastroesophageal reflux disease, class I obesity, type 2 diabetes mellitus with hyperlipidemia, asthma/COPD and tobacco abuse; presented to the hospital secondary to mechanical fall Triggering right leg pain and inability to bear weight. Pt s/p R tibial IM nailing 10/26/21.   Clinical Impression   Chart reviewed, pt greeted in chair agreeable to OT evaluation. Pt is alert and oriented x4, fair-good safety awareness. PTA pt is indep in ADL/IADL. Pt presents with deficits in strength, endurance, balance, activity tolerance affecting safe and optimal ADL completion. Pt amb 10' 2 attempts with RW, good adherence to [place] precautions throughout. Transfer from beside chair>bed with CGA with RW. Intermittent vcs required throughout for technique. Significant education provided re: DME use/home safety as pt is eager to discharge home with family help. Pt would benefit from skilled OT to address functional deficits to improve safe ADL/ functional mobility completion.      Recommendations for follow up therapy are one component of a multi-disciplinary discharge planning process, led by the attending physician.  Recommendations may be updated based on patient status, additional functional criteria and insurance authorization.   Follow Up Recommendations  Home health OT    Assistance Recommended at Discharge Frequent or constant Supervision/Assistance  Patient can return home with the following A lot of help with walking and/or transfers;A little help with bathing/dressing/bathroom    Functional Status Assessment  Patient has had a recent decline in their functional status and demonstrates the ability to make significant  improvements in function in a reasonable and predictable amount of time.  Equipment Recommendations  BSC/3in1;Wheelchair (measurements OT);Tub/shower bench    Recommendations for Other Services       Precautions / Restrictions Precautions Precautions: Fall Restrictions Weight Bearing Restrictions: Yes RLE Weight Bearing: Weight bearing as tolerated      Mobility Bed Mobility               General bed mobility comments: NT pt in recliner pre/post session    Transfers Overall transfer level: Needs assistance Equipment used: Rolling walker (2 wheels) Transfers: Sit to/from Stand Sit to Stand: Min guard                  Balance Overall balance assessment: Needs assistance Sitting-balance support: Bilateral upper extremity supported Sitting balance-Leahy Scale: Good     Standing balance support: Bilateral upper extremity supported, During functional activity, Reliant on assistive device for balance Standing balance-Leahy Scale: Fair                             ADL either performed or assessed with clinical judgement   ADL Overall ADL's : Needs assistance/impaired Eating/Feeding: Set up;Sitting   Grooming: Wash/dry hands;Sitting;Set up               Lower Body Dressing: Minimal assistance Lower Body Dressing Details (indicate cue type and reason): socks, anticipate Toilet Transfer: Rolling walker (2 wheels);Ambulation;Min guard;Minimal assistance Toilet Transfer Details (indicate cue type and reason): NWBing RLE         Functional mobility during ADLs: Min guard;Minimal assistance;Rolling walker (2 wheels) (approx 10' 2 attempts)       Vision Patient Visual Report: No change from baseline       Perception  Praxis      Pertinent Vitals/Pain Pain Assessment Pain Assessment: 0-10 Pain Score: 6  Pain Location: RLE Pain Descriptors / Indicators: Aching, Discomfort, Grimacing, Throbbing Pain Intervention(s): Limited activity  within patient's tolerance, Monitored during session, Repositioned     Hand Dominance     Extremity/Trunk Assessment Upper Extremity Assessment Upper Extremity Assessment: Overall WFL for tasks assessed   Lower Extremity Assessment Lower Extremity Assessment: RLE deficits/detail   Cervical / Trunk Assessment Cervical / Trunk Assessment: Normal   Communication Communication Communication: No difficulties   Cognition Arousal/Alertness: Awake/alert Behavior During Therapy: WFL for tasks assessed/performed Overall Cognitive Status: Within Functional Limits for tasks assessed                                       General Comments  vital signs appear stable throughout    Exercises Other Exercises Other Exercises: edu re: role of OT, roleof rehab, discharge recommendations, home safety, DME use, falls prevention, precautiosn   Shoulder Instructions      Home Living Family/patient expects to be discharged to:: Private residence Living Arrangements: Spouse/significant other Available Help at Discharge: Family;Available 24 hours/day Type of Home: Mobile home Home Access: Stairs to enter Entrance Stairs-Number of Steps: 5 Entrance Stairs-Rails: Right;Left;Can reach both Home Layout: One level     Bathroom Shower/Tub: Teacher, early years/pre: Handicapped height         Additional Comments: pt reports she can get bsc, RW from family members      Prior Functioning/Environment Prior Level of Function : Independent/Modified Independent                        OT Problem List: Decreased strength;Decreased activity tolerance;Decreased knowledge of use of DME or AE      OT Treatment/Interventions: Self-care/ADL training;Therapeutic exercise;Patient/family education;Balance training;Therapeutic activities;DME and/or AE instruction    OT Goals(Current goals can be found in the care plan section) Acute Rehab OT Goals Patient Stated Goal: go  home OT Goal Formulation: With patient Time For Goal Achievement: 11/10/21 Potential to Achieve Goals: Good ADL Goals Pt Will Perform Grooming: with modified independence Pt Will Perform Lower Body Dressing: with modified independence;sit to/from stand Pt Will Transfer to Toilet: with modified independence Pt Will Perform Toileting - Clothing Manipulation and hygiene: with modified independence  OT Frequency: Min 2X/week    Co-evaluation              AM-PAC OT "6 Clicks" Daily Activity     Outcome Measure Help from another person eating meals?: None Help from another person taking care of personal grooming?: None Help from another person toileting, which includes using toliet, bedpan, or urinal?: A Little Help from another person bathing (including washing, rinsing, drying)?: A Little Help from another person to put on and taking off regular upper body clothing?: None Help from another person to put on and taking off regular lower body clothing?: A Little 6 Click Score: 21   End of Session Equipment Utilized During Treatment: Gait belt;Rolling walker (2 wheels) Nurse Communication: Mobility status  Activity Tolerance: Patient tolerated treatment well Patient left: in bed;with call bell/phone within reach  OT Visit Diagnosis: Unsteadiness on feet (R26.81);History of falling (Z91.81)                Time: 4268-3419 OT Time Calculation (min): 16 min Charges:  OT General Charges $OT  Visit: 1 Visit OT Evaluation $OT Eval Low Complexity: 1 Low  Shanon Payor, OTD OTR/L  10/27/21, 4:22 PM

## 2021-10-27 NOTE — Evaluation (Signed)
Physical Therapy Evaluation Patient Details Name: Vanessa Baker MRN: [id number] DOB: [birth year] Today's Date: 10/27/2021  History of Present Illness  Vanessa Baker is a 46 y.o. female with medical history significant of bipolar disorder, hypertension, hyperlipidemia, gastroesophageal reflux disease, class I obesity, type 2 diabetes mellitus with hyperlipidemia, asthma/COPD and tobacco abuse; presented to the hospital secondary to mechanical fall Triggering right leg pain and inability to bear weight. Pt s/p R tibial IM nailing 10/26/21.   Clinical Impression  Pt admitted with above diagnosis. Pt received supine in bed agreeable to PT services. Pt reports RLE pain, understanding of NWB precautions for RLE. Has initial concerns trying to perform mobility maintaining precautions.  Normally pt is indep with all aspects of mobility and ADL's. Does endorse taking many medications that can make her feel off balance. Besides this fall leading to current admission, pt has had 1 other fall recently due to dizziness from meds.   To date, pt able to perform bed mobility mod-I with bed features maintaining NWB once seated EoB. PT demo and education on STS transfer and options with transfer to recliner with hopping or pivoting and ways to maintain RLE in NWB. Pt performs STS minguard with mild to mod dizziness with good stability appreciated requesting seated rest. After 1-2 in seated rest x2 SPT's performed with RW from bed to recliner then recliner to [facility] due to need to urinate. Pt able to perform pericare independently then SPT back to recliner with safe use of RW and min VC's throughout for hand placement and RLE placement prior to sitting. RLE elevated for edema management. Spouse and Pt educated on expectations for safe d/c home with [facility] PT services and needed DME for success. Education provided on if unsuccessful with stairs and hop to gait household distances pt may require manual W/c at home or  possible STR placement. Pt and spouse understanding with plan to d/c home pending progression of gait and stair training. All needs in reach. Pt currently with functional limitations due to the deficits listed below (see PT Problem List). Pt will benefit from skilled PT to increase their independence and safety with mobility to allow discharge to the venue listed below.       Recommendations for follow up therapy are one component of a multi-disciplinary discharge planning process, led by the attending physician.  Recommendations may be updated based on patient status, additional functional criteria and insurance authorization.  Follow Up Recommendations Home health PT      Assistance Recommended at Discharge Frequent or constant Supervision/Assistance  Patient can return home with the following  A little help with walking and/or transfers;A lot of help with bathing/dressing/bathroom;Assist for transportation;Assistance with cooking/housework;A little help with bathing/dressing/bathroom;Help with stairs or ramp for entrance    Equipment Recommendations Rolling walker (2 wheels);BSC/3in1  Recommendations for Other Services       Functional Status Assessment Patient has had a recent decline in their functional status and demonstrates the ability to make significant improvements in function in a reasonable and predictable amount of time.     Precautions / Restrictions Precautions Precautions: Fall Restrictions Weight Bearing Restrictions: Yes RLE Weight Bearing: Non weight bearing      Mobility  Bed Mobility Overal bed mobility: Modified Independent             General bed mobility comments: bed features Patient Response: Cooperative  Transfers Overall transfer level: Needs assistance Equipment used: Rolling walker (2 wheels) Transfers: Sit to/from Stand, Bed to chair/wheelchair/BSC Sit  to Stand: Min guard   Step pivot transfers: Min guard       General transfer  comment: VC's for hand placement and sequencing RW.    Ambulation/Gait                  Stairs            Wheelchair Mobility    Modified Rankin (Stroke Patients Only)       Balance Overall balance assessment: Needs assistance Sitting-balance support: Bilateral upper extremity supported Sitting balance-Leahy Scale: Good       Standing balance-Leahy Scale: Fair Standing balance comment: Reliant on AD due to NWB precautions                             Pertinent Vitals/Pain Pain Assessment Pain Assessment: Faces Faces Pain Scale: Hurts whole lot Pain Location: RLE Pain Descriptors / Indicators: Aching, Discomfort, Grimacing, Throbbing Pain Intervention(s): Limited activity within patient's tolerance, Monitored during session, Ice applied    Home Living Family/patient expects to be discharged to:: Private residence Living Arrangements: Spouse/significant other Available Help at Discharge: Family;Available 24 hours/day Type of Home: Mobile home Home Access: Stairs to enter Entrance Stairs-Rails: Right;Left;Can reach both Entrance Stairs-Number of Steps: 5   Home Layout: One level        Prior Function Prior Level of Function : Independent/Modified Independent                     Hand Dominance        Extremity/Trunk Assessment   Upper Extremity Assessment Upper Extremity Assessment: Overall WFL for tasks assessed    Lower Extremity Assessment Lower Extremity Assessment: Generalized weakness;RLE deficits/detail RLE Deficits / Details: NWB    Cervical / Trunk Assessment Cervical / Trunk Assessment: Normal  Communication   Communication: No difficulties  Cognition Arousal/Alertness: Awake/alert Behavior During Therapy: WFL for tasks assessed/performed Overall Cognitive Status: Within Functional Limits for tasks assessed                                          General Comments General comments (skin  integrity, edema, etc.): SPO2 > 90% titrated to RA    Exercises Other Exercises Other Exercises: Education on NWB RLE exercises for [place] strengthening. SLR and hip abduction in supine. NWB precautions, safe use of DME, D/c recs, role of PT in acute setting.   Assessment/Plan    PT Assessment Patient needs continued PT services  PT Problem List Decreased strength;Decreased activity tolerance;Decreased balance;Pain;Decreased mobility       PT Treatment Interventions DME instruction;Balance training;Gait training;Neuromuscular re-education;Stair training;Functional mobility training;Patient/family education;Therapeutic activities;Therapeutic exercise;Wheelchair mobility training    PT Goals (Current goals can be found in the Care Plan section)  Acute Rehab PT Goals Patient Stated Goal: improve pain, improve mobility to get home safely. PT Goal Formulation: With patient Time For Goal Achievement: 11/10/21 Potential to Achieve Goals: Good    Frequency 7X/week     Co-evaluation               AM-PAC PT "6 Clicks" Mobility  Outcome Measure Help needed turning from your back to your side while in a flat bed without using bedrails?: A Little Help needed moving from lying on your back to sitting on the side of a flat bed without using bedrails?: A Little Help  needed moving to and from a bed to a chair (including a wheelchair)?: A Little Help needed standing up from a chair using your arms (e.g., wheelchair or bedside chair)?: A Little Help needed to walk in hospital room?: A Lot Help needed climbing 3-5 steps with a railing? : A Lot 6 Click Score: 16    End of Session Equipment Utilized During Treatment: Gait belt Activity Tolerance: Patient tolerated treatment well Patient left: in chair;with call bell/phone within reach;with chair alarm set Nurse Communication: Mobility status PT Visit Diagnosis: Other abnormalities of gait and mobility (R26.89);Muscle weakness (generalized)  (M62.81);Difficulty in walking, not elsewhere classified (R26.2);Pain Pain - Right/Left: Right Pain - part of body: Ankle and joints of foot;Leg    Time: 4327-6147 PT Time Calculation (min) (ACUTE ONLY): 37 min   Charges:   PT Evaluation $PT Eval Moderate Complexity: 1 Mod PT Treatments $Gait Training: 8-22 mins        Salem Caster. Fairly IV, PT, DPT Physical Therapist- [facility]  10/27/2021, 11:32 AM

## 2021-10-27 NOTE — Plan of Care (Signed)
  Problem: Education: Goal: Ability to describe self-care measures that may prevent or decrease complications (Diabetes Survival Skills Education) will improve Outcome: Progressing Goal: Individualized Educational Video(s) Outcome: Progressing   Problem: Coping: Goal: Ability to adjust to condition or change in health will improve Outcome: Progressing   Problem: Fluid Volume: Goal: Ability to maintain a balanced intake and output will improve Outcome: Progressing   Problem: Health Behavior/Discharge Planning: Goal: Ability to identify and utilize available resources and services will improve Outcome: Progressing Goal: Ability to manage health-related needs will improve Outcome: Progressing   Problem: Metabolic: Goal: Ability to maintain appropriate glucose levels will improve Outcome: Progressing   Problem: Nutritional: Goal: Maintenance of adequate nutrition will improve Outcome: Progressing Goal: Progress toward achieving an optimal weight will improve Outcome: Progressing   Problem: Skin Integrity: Goal: Risk for impaired skin integrity will decrease Outcome: Progressing   Problem: Tissue Perfusion: Goal: Adequacy of tissue perfusion will improve Outcome: Progressing   Problem: Education: Goal: Verbalization of understanding the information provided (i.e., activity precautions, restrictions, etc) will improve Outcome: Progressing Goal: Individualized Educational Video(s) Outcome: Progressing   Problem: Activity: Goal: Ability to ambulate and perform ADLs will improve Outcome: Progressing   Problem: Clinical Measurements: Goal: Postoperative complications will be avoided or minimized Outcome: Progressing   Problem: Self-Concept: Goal: Ability to maintain and perform role responsibilities to the fullest extent possible will improve Outcome: Progressing   Problem: Pain Management: Goal: Pain level will decrease Outcome: Progressing   Problem:  Education: Goal: Knowledge of General Education information will improve Description: Including pain rating scale, medication(s)/side effects and non-pharmacologic comfort measures Outcome: Progressing   Problem: Health Behavior/Discharge Planning: Goal: Ability to manage health-related needs will improve Outcome: Progressing   Problem: Clinical Measurements: Goal: Ability to maintain clinical measurements within normal limits will improve Outcome: Progressing Goal: Will remain free from infection Outcome: Progressing Goal: Diagnostic test results will improve Outcome: Progressing Goal: Respiratory complications will improve Outcome: Progressing Goal: Cardiovascular complication will be avoided Outcome: Progressing   Problem: Activity: Goal: Risk for activity intolerance will decrease Outcome: Progressing   Problem: Nutrition: Goal: Adequate nutrition will be maintained Outcome: Progressing   Problem: Coping: Goal: Level of anxiety will decrease Outcome: Progressing   Problem: Elimination: Goal: Will not experience complications related to bowel motility Outcome: Progressing Goal: Will not experience complications related to urinary retention Outcome: Progressing   Problem: Pain Managment: Goal: General experience of comfort will improve Outcome: Progressing   Problem: Safety: Goal: Ability to remain free from injury will improve Outcome: Progressing   Problem: Skin Integrity: Goal: Risk for impaired skin integrity will decrease Outcome: Progressing

## 2021-10-27 NOTE — Progress Notes (Addendum)
Subjective: 1 Day Post-Op Procedure(s) (LRB): INTRAMEDULLARY (IM) NAIL TIBIAL (Right) Patient reports mild to moderate pain over night. OK otherwise. Resting comfortably with splint in place with adult female at bedside  Objective: Vital signs in last 24 hours: Temp:  [97.4 F (36.3 C)-98.8 F (37.1 C)] 98.3 F (36.8 C) (09/22 0518) Pulse Rate:  [65-96] 94 (09/22 0518) Resp:  [10-20] 19 (09/22 0518) BP: (113-158)/(52-85) 154/76 (09/22 0518) SpO2:  [89 %-98 %] 94 % (09/22 0518) Weight:  [81.6 kg] 81.6 kg (09/21 1522)  Intake/Output from previous day: 09/21 0701 - 09/22 0700 In: 700 [I.V.:600; IV Piggyback:100] Out: 621 [Urine:620; Blood:1] Intake/Output this shift: No intake/output data recorded.  Recent Labs    10/26/21 0549 10/27/21 0536  HGB 15.3* 13.8   Recent Labs    10/26/21 0549 10/27/21 0536  WBC 12.6* 12.7*  RBC 5.03 4.60  HCT 46.1* 42.6  PLT 290 256   Recent Labs    10/26/21 0549 10/27/21 0536  NA 139 138  K 4.1 4.1  CL 105 105  CO2 26 24  BUN 12 8  CREATININE 0.59 0.59  GLUCOSE 161* 166*  CALCIUM 8.8* 8.2*   Recent Labs    10/26/21 0549  INR 1.1   PE Well appearing resting comfortably in NAD, Nasal canula in place RLE in splint Proximal bandage well appearing without bleed through or issue. Well prefused, moves digits to instruction. Neurovascularly intact   Assessment/Plan: 1 Day Post-Op Procedure(s) (LRB): INTRAMEDULLARY (IM) NAIL TIBIAL (Right)   Patient doing well postoperatively. Ortho plan as below per Dr. Sharlet Salina. Please consider PT eval on crutches or assisted device for NWB on RLE  POSTOPERATIVE PLAN: She will be non-weightbearing on the operative extremity. Ok to start DVT ppx POD#1-aspirin 325 mg daily. Keep splint clean and dry until follow-up. Outpatient f/u in clinic in 2 weeks for staple removal and xrays, F/u [facility] office, Dr. Sharlet Salina or [place] PA- C 10/27/2021, 8:03 AM

## 2021-10-27 NOTE — Addendum Note (Signed)
Addendum  created 10/27/21 0839 by Beverely Low, CRNA   Intraprocedure Meds edited

## 2021-10-28 ENCOUNTER — Inpatient Hospital Stay: Payer: Medicare Other

## 2021-10-28 DIAGNOSIS — S82201A Unspecified fracture of shaft of right tibia, initial encounter for closed fracture: Secondary | ICD-10-CM | POA: Diagnosis not present

## 2021-10-28 DIAGNOSIS — S82401A Unspecified fracture of shaft of right fibula, initial encounter for closed fracture: Secondary | ICD-10-CM | POA: Diagnosis not present

## 2021-10-28 LAB — URINALYSIS, COMPLETE (UACMP) WITH MICROSCOPIC
Bilirubin Urine: NEGATIVE
Glucose, UA: NEGATIVE mg/dL
Ketones, ur: NEGATIVE mg/dL
Leukocytes,Ua: NEGATIVE
Nitrite: NEGATIVE
Protein, ur: NEGATIVE mg/dL
Specific Gravity, Urine: 1.02 (ref 1.005–1.030)
pH: 6 (ref 5.0–8.0)

## 2021-10-28 LAB — GLUCOSE, CAPILLARY: Glucose-Capillary: 261 mg/dL — ABNORMAL HIGH (ref 70–99)

## 2021-10-28 MED ORDER — ASPIRIN 325 MG PO TBEC: 325.0000 mg | DELAYED_RELEASE_TABLET | Freq: Every day | ORAL | 0 refills | Status: AC

## 2021-10-28 MED ORDER — DOCUSATE SODIUM 100 MG PO CAPS: 100.0000 mg | ORAL_CAPSULE | Freq: Two times a day (BID) | ORAL | 0 refills | Status: DC | PRN

## 2021-10-28 MED ORDER — METHOCARBAMOL 750 MG PO TABS: 750.0000 mg | ORAL_TABLET | Freq: Three times a day (TID) | ORAL | 0 refills | Status: AC

## 2021-10-28 MED ORDER — OXYCODONE HCL 5 MG PO TABS: 5.0000 mg | ORAL_TABLET | Freq: Four times a day (QID) | ORAL | 0 refills | Status: DC | PRN

## 2021-10-28 NOTE — Discharge Summary (Signed)
Physician Discharge Summary  Vanessa Baker Plocher TDV:[id number] DOB: [birth year] DOA: 10/26/2021  PCP: Venita Lick, NP  Admit date: 10/26/2021 Discharge date: 10/28/2021  Admitted From: Home Disposition:  Home with home health  Recommendations for Outpatient Follow-up:  Follow up with PCP in 1-2 weeks Follow up outpatient orthopedic surgery 10-14 days  Home Health:Yes PT OT  Equipment/Devices:None (has w/c, RW, BSC at home)   Discharge Condition:Stable  CODE STATUS:FULL  Diet recommendation: Carb mod  Brief/Interim Summary:  46 y.o. female with medical history significant of bipolar disorder, hypertension, hyperlipidemia, gastroesophageal reflux disease, class I obesity, type 2 diabetes mellitus with hyperlipidemia, asthma/COPD and tobacco abuse; presented to the hospital secondary to mechanical fall Triggering right leg pain and inability to bear weight. Patient reports tripping and falling; no chest pain, no palpitations, no dizziness or lightheadedness symptoms.  Vanessa Baker also reports no nausea, no vomiting, no dysuria, no hematuria, no melena, no hematochezia, no productive cough or sick contacts.     Work-up in the ED demonstrating acute close distal tibia and distal fibular fracture.   Pain medication and splint provided; orthopedic doctors consulted with recommendation for admission for surgical stabilization. [facility] called to assist with placement and further management.   Status post operative fixation in OR with orthopedics on 9/21.  Tolerated procedure well.  No immediate complications.  Postoperative pain mild.  9/23: Pain well controlled.  Patient stable for discharge at this time.  Follow-up outpatient orthopedics 10 to 14 days    Discharge Diagnoses:  Principal Problem:   Tibia/fibula fracture, right, closed, initial encounter Active Problems:   Hyperlipidemia associated with type 2 diabetes mellitus ([place])   Hypertension associated with diabetes ([place])   Moderate  persistent asthma without complication   Restless leg syndrome   Type 2 diabetes mellitus with obesity (HCC)   Class 1 obesity   Gastroesophageal reflux disease   Bipolar 1 disorder, mixed, moderate (HCC)   Tobacco abuse   Diabetic neuropathy (HCC)   Leukocytosis  Tibia/fibula fracture, right, closed, initial encounter -Mechanical fall triggering acute closed distal tibial and distal fibular fractures. -Patient complaining of excruciating pain. Status post operative fixation 9/21 Tolerated procedure well Post operative pain mild Plan: DC home with home health DVT prophylaxis aspirin 325 mg daily x4 weeks Multimodal pain control Nonweightbearing right lower extremity Therapy as tolerated Follow-up outpatient orthopedics   Discharge Instructions  Discharge Instructions     Diet - low sodium heart healthy   Complete by: As directed    Increase activity slowly   Complete by: As directed    No wound care   Complete by: As directed       Allergies as of 10/28/2021       Reactions   Amitriptyline Other (See Comments)   Confusion, paralysis   Wellbutrin [bupropion] Nausea Only   falls        Medication List     TAKE these medications    Accu-Chek Aviva Plus test strip Generic drug: glucose blood USE 1 STRIP TO CHECK GLUCOSE UP TO 3 TIMES DAILY AS DIRECTED.   Accu-Chek Aviva Plus w/Device Kit Use to check blood sugar up to 3 times daily   Accu-Chek Softclix Lancets lancets Use to check blood sugar up to 3 times daily as instructed   albuterol 108 (90 Base) MCG/ACT inhaler Commonly known as: VENTOLIN HFA INHALE 2 PUFFS BY MOUTH EVERY 6 HOURS AS NEEDED FOR WHEEZING AND FOR SHORTNESS OF BREATH   aspirin EC 325 MG tablet Take  1 tablet (325 mg total) by mouth daily with breakfast for 28 days. Start taking on: October 29, 2021   atorvastatin 40 MG tablet Commonly known as: LIPITOR Take 1 tablet (40 mg total) by mouth daily.   B-12 1000 MCG Tbdp Take  1,000 mcg by mouth daily.   baclofen 10 MG tablet Commonly known as: LIORESAL Take 1 tablet (10 mg total) by mouth 3 (three) times daily as needed. for muscle spams   benztropine 1 MG tablet Commonly known as: COGENTIN Take 1 mg by mouth at bedtime.   blood glucose meter kit and supplies Dispense based on patient and insurance preference. Use up to four times daily as directed. (FOR ICD-10 E10.9, E11.9).   carbamazepine 200 MG tablet Commonly known as: TEGRETOL Take 1 tablet daily by mouth AM and 1.5 tablets by mouth daily PM.   docusate sodium 100 MG capsule Commonly known as: COLACE Take 1 capsule (100 mg total) by mouth 2 (two) times daily as needed for mild constipation.   fluticasone furoate-vilanterol 100-25 MCG/ACT Aepb Commonly known as: Breo Ellipta Inhale 1 puff into the lungs daily.   gabapentin 600 MG tablet Commonly known as: NEURONTIN Take 600 mg by mouth See admin instructions. Take 600 MG (one tablet) by mouth at 6 pm and 1200 MG (two tablets) by mouth at bedtime.   lisinopril 5 MG tablet Commonly known as: ZESTRIL Take 5 mg by mouth daily.   methocarbamol 750 MG tablet Commonly known as: Robaxin-750 Take 1 tablet (750 mg total) by mouth 3 (three) times daily for 21 days.   OLANZapine 20 MG tablet Commonly known as: ZYPREXA Take 1 tablet (20 mg total) by mouth at bedtime.   omega-3 acid ethyl esters 1 g capsule Commonly known as: LOVAZA Take 1 capsule (1 g total) by mouth 2 (two) times daily.   omeprazole 20 MG capsule Commonly known as: PRILOSEC Take 1 capsule by mouth once daily   oxyCODONE 5 MG immediate release tablet Commonly known as: Oxy IR/ROXICODONE Take 1-2 tablets (5-10 mg total) by mouth every 6 (six) hours as needed for moderate pain or severe pain.   pregabalin 100 MG capsule Commonly known as: LYRICA Take 100 mg by mouth 2 (two) times daily.   primidone 50 MG tablet Commonly known as: MYSOLINE TAKE 1 TABLET BY MOUTH AT  BEDTIME   rOPINIRole 2 MG tablet Commonly known as: REQUIP Take 1 tablet (2 mg total) by mouth at bedtime.   sertraline 50 MG tablet Commonly known as: ZOLOFT Take 1 tablet (50 mg total) by mouth daily.   traZODone 100 MG tablet Commonly known as: DESYREL Take 2 tablets (200 mg total) by mouth at bedtime.        Allergies  Allergen Reactions   Amitriptyline Other (See Comments)    Confusion, paralysis   Wellbutrin [Bupropion] Nausea Only    falls    Consultations: Orthopedics  Procedures/Studies: DG Chest 1 View  Result Date: 10/28/2021 CLINICAL DATA:  Dyspnea EXAM: CHEST  1 VIEW COMPARISON:  04/30/2019 FINDINGS: The heart size and mediastinal contours are within normal limits. Both lungs are clear. The visualized skeletal structures are unremarkable. IMPRESSION: No active disease. Electronically Signed   By: Fidela Salisbury M.D.   On: 10/28/2021 01:49   DG Tibia/Fibula Right  Result Date: 10/26/2021 CLINICAL DATA:  Intraoperative exam, fracture repair EXAM: RIGHT TIBIA AND FIBULA - 2 VIEW COMPARISON:  10/26/2021 FINDINGS: Ten fluoroscopic images are obtained during the performance of the procedure and  are provided for interpretation only. Comminuted distal tibia and fibular fractures are noted. Placement of an intramedullary rod with proximal and distal interlocking screws within the tibia. Fibular fracture fragment remains in stable alignment. Fluoroscopy time: 1 minute 50 seconds, 4.0 mGy IMPRESSION: 1. ORIF of a comminuted distal tibial diaphyseal fracture, with near anatomic alignment. 2. Oblique distal fibular fracture, with stable alignment. Electronically Signed   By: Randa Ngo M.D.   On: 10/26/2021 18:30   DG C-Arm 1-60 Min-No Report  Result Date: 10/26/2021 Fluoroscopy was utilized by the requesting physician.  No radiographic interpretation.   DG Tibia/Fibula Right  Result Date: 10/26/2021 CLINICAL DATA:  Right ankle fracture. EXAM: RIGHT ANKLE - COMPLETE 3+  VIEW; RIGHT TIBIA AND FIBULA - 2 VIEW COMPARISON:  None Available. FINDINGS: Right tibia fibula series: There is a spiral fracture of the distal tibial shaft, with the distal fragment displaced laterally by 1/2 of a shaft width with slight posterior angulation of the distal fragment. Also noted is a comminuted fracture of the distal diametaphysis of the fibula with the distal fragment displaced laterally up to 1 cortex width, posteriorly by 1/2 of a shaft width, with nondisplaced comminution fragments in between the fracture margins. There is normal bone mineralization. Arthritic changes are not seen. No joint effusion at the knee. There is swelling of the distal foreleg. Right ankle series: The lower fracture margin of the distal fibular fracture is at the level of the tibial plafond, but there is no asymmetry of the mortise. The other malleoli are intact. The talar dome contour is preserved. Arthritic changes are not seen. There is a moderate-sized plantar calcaneal spur. IMPRESSION: Acute closed distal tibial and distal fibular fractures, as detailed above. Electronically Signed   By: Telford Nab M.D.   On: 10/26/2021 05:22   DG Ankle Complete Right  Result Date: 10/26/2021 CLINICAL DATA:  Right ankle fracture. EXAM: RIGHT ANKLE - COMPLETE 3+ VIEW; RIGHT TIBIA AND FIBULA - 2 VIEW COMPARISON:  None Available. FINDINGS: Right tibia fibula series: There is a spiral fracture of the distal tibial shaft, with the distal fragment displaced laterally by 1/2 of a shaft width with slight posterior angulation of the distal fragment. Also noted is a comminuted fracture of the distal diametaphysis of the fibula with the distal fragment displaced laterally up to 1 cortex width, posteriorly by 1/2 of a shaft width, with nondisplaced comminution fragments in between the fracture margins. There is normal bone mineralization. Arthritic changes are not seen. No joint effusion at the knee. There is swelling of the distal  foreleg. Right ankle series: The lower fracture margin of the distal fibular fracture is at the level of the tibial plafond, but there is no asymmetry of the mortise. The other malleoli are intact. The talar dome contour is preserved. Arthritic changes are not seen. There is a moderate-sized plantar calcaneal spur. IMPRESSION: Acute closed distal tibial and distal fibular fractures, as detailed above. Electronically Signed   By: Telford Nab M.D.   On: 10/26/2021 05:22      Subjective: Patient seen and examined on day of discharge.  Stable no distress.  Pain well controlled.  Appropriate for discharge home.  Discharge Exam: Vitals:   10/28/21 0542 10/28/21 0728  BP: (!) 136/95 (!) 157/56  Pulse: 78 82  Resp: 17   Temp: 99 F (37.2 C) 98.5 F (36.9 C)  SpO2: 97% 98%   Vitals:   10/28/21 0008 10/28/21 0033 10/28/21 0542 10/28/21 0728  BP: Marland Kitchen)  169/64 (!) 150/78 (!) 136/95 (!) 157/56  Pulse: (!) 103 100 78 82  Resp:  18 17   Temp:  99.9 F (37.7 C) 99 F (37.2 C) 98.5 F (36.9 C)  TempSrc:  Oral Oral   SpO2: 96% 94% 97% 98%  Weight:      Height:        General: Pt is alert, awake, not in acute distress Cardiovascular: RRR, S1/S2 +, no rubs, no gallops Respiratory: CTA bilaterally, no wheezing, no rhonchi Abdominal: Soft, NT, ND, bowel sounds + Extremities: no edema, no cyanosis    The results of significant diagnostics from this hospitalization (including imaging, microbiology, ancillary and laboratory) are listed below for reference.     Microbiology: No results found for this or any previous visit (from the past 240 hour(s)).   Labs: BNP (last 3 results) No results for input(s): "BNP" in the last 8760 hours. Basic Metabolic Panel: Recent Labs  Lab 10/26/21 0549 10/27/21 0536  NA 139 138  K 4.1 4.1  CL 105 105  CO2 26 24  GLUCOSE 161* 166*  BUN 12 8  CREATININE 0.59 0.59  CALCIUM 8.8* 8.2*   Liver Function Tests: No results for input(s): "AST", "ALT",  "ALKPHOS", "BILITOT", "PROT", "ALBUMIN" in the last 168 hours. No results for input(s): "LIPASE", "AMYLASE" in the last 168 hours. No results for input(s): "AMMONIA" in the last 168 hours. CBC: Recent Labs  Lab 10/26/21 0549 10/27/21 0536  WBC 12.6* 12.7*  NEUTROABS 9.3*  --   HGB 15.3* 13.8  HCT 46.1* 42.6  MCV 91.7 92.6  PLT 290 256   Cardiac Enzymes: No results for input(s): "CKTOTAL", "CKMB", "CKMBINDEX", "TROPONINI" in the last 168 hours. BNP: Invalid input(s): "POCBNP" CBG: Recent Labs  Lab 10/27/21 0749 10/27/21 1146 10/27/21 1718 10/27/21 2134 10/28/21 0800  GLUCAP 150* 230* 182* 183* 261*   D-Dimer No results for input(s): "DDIMER" in the last 72 hours. Hgb A1c Recent Labs    10/26/21 0549  HGBA1C 7.0*   Lipid Profile No results for input(s): "CHOL", "HDL", "LDLCALC", "TRIG", "CHOLHDL", "LDLDIRECT" in the last 72 hours. Thyroid function studies No results for input(s): "TSH", "T4TOTAL", "T3FREE", "THYROIDAB" in the last 72 hours.  Invalid input(s): "FREET3" Anemia work up No results for input(s): "VITAMINB12", "FOLATE", "FERRITIN", "TIBC", "IRON", "RETICCTPCT" in the last 72 hours. Urinalysis    Component Value Date/Time   COLORURINE YELLOW 10/28/2021 0129   APPEARANCEUR CLEAR 10/28/2021 0129   APPEARANCEUR Clear 03/21/2021 1429   LABSPEC 1.020 10/28/2021 0129   LABSPEC 1.024 06/11/2013 1412   PHURINE 6.0 10/28/2021 0129   GLUCOSEU NEGATIVE 10/28/2021 0129   GLUCOSEU Negative 06/11/2013 1412   HGBUR MODERATE (A) 10/28/2021 0129   BILIRUBINUR NEGATIVE 10/28/2021 0129   BILIRUBINUR Negative 03/21/2021 1429   BILIRUBINUR Negative 06/11/2013 1412   KETONESUR NEGATIVE 10/28/2021 0129   PROTEINUR NEGATIVE 10/28/2021 0129   UROBILINOGEN 0.2 07/25/2018 1001   NITRITE NEGATIVE 10/28/2021 0129   LEUKOCYTESUR NEGATIVE 10/28/2021 0129   LEUKOCYTESUR Negative 06/11/2013 1412   Sepsis Labs Recent Labs  Lab 10/26/21 0549 10/27/21 0536  WBC 12.6* 12.7*    Microbiology No results found for this or any previous visit (from the past 240 hour(s)).   Time coordinating discharge: Over 30 minutes  SIGNED:   Sidney Ace, MD  [facility] 10/28/2021, 10:31 AM Pager   If 7PM-7AM, please contact night-coverage

## 2021-10-28 NOTE — TOC Transition Note (Signed)
Transition of Care [facility]) - CM/SW Discharge Note   Patient Details  Name: Vanessa Baker MRN: [id number] Date of Birth: 1975-03-11  [facility] of [facility]) CM/SW Contact:  Izola Price, RN Phone Number: 10/28/2021, 10:10 AM   Clinical Narrative:  9/23: Patient discharging today with HHPT set up via [facility]. Has all needed DME from relative.  Husband to transport home. Simmie Davies RN CM     Final next level of care: Home w Home Health Services Barriers to Discharge: Barriers Resolved   Patient Goals and CMS Choice        Discharge Placement                       Discharge Plan and Services   Discharge Planning Services: CM Consult            DME Arranged: N/A (Has needed DME from a relative at home.) DME Agency: NA       HH Arranged: PT [place] Agency: [employer]        Social Determinants of Health (SDOH) Interventions     Readmission Risk Interventions    04/30/2019   11:29 AM  Readmission Risk Prevention Plan  Transportation Screening Complete  PCP or Specialist Appt within 3-5 Days Complete  HRI or Home Care Consult Complete  Medication Review (RN Care Manager) Complete

## 2021-10-28 NOTE — Progress Notes (Signed)
   10/28/21 0103  Provider Notification  Provider Name/Title Sidney Ace, MD  Date Provider Notified 10/28/21  Time Provider Notified 0103  Method of Notification Page  Notification Reason Other (Comment) (Pt is complaining that she does not feel well and is light headed. She has a slightly elevated temperature of 99.51F. When taking her vitals her oxygen was 84% on room air, Put her on 2L and her oxygen went up to 92%.)  Provider response See new orders (Tylenol for fever, UA was ordered, Port CXR was ordered, D-Dimer)  Date of Provider Response 10/28/21  Time of Provider Response [phone]

## 2021-10-28 NOTE — Progress Notes (Signed)
Physical Therapy Treatment Patient Details Name: Vanessa Baker MRN: [id number] DOB: December 06, 1975 Today's Date: 10/28/2021   History of Present Illness Vanessa Baker is a 46 y.o. female with medical history significant of bipolar disorder, hypertension, hyperlipidemia, gastroesophageal reflux disease, class I obesity, type 2 diabetes mellitus with hyperlipidemia, asthma/COPD and tobacco abuse; presented to the hospital secondary to mechanical fall Triggering right leg pain and inability to bear weight. Pt s/p R tibial IM nailing 10/26/21.    PT Comments    Pt was sitting in recliner upon arriving. She endorses having rough nigh but feels better today. Sao2 95% and HR 75 bpm at rest. Sao2 remained greater that 88% on rm air throughout session. PT is limited by pain and NWB RLE but overall demonstrated abilities required to safely return home. She is able to transfer without assistance and "hop" on LLE while maintaining NWB. Pt performed stairs while bumping up backwards on her bottom and fwds when descending. Unwilling to attempt hopping up stairs. Also was educated on performance with using w/c to ascend/descend with spouse + fathers assistance. Overall pt is doing well. Is cleared form an acute standpoint for safe DC home once cleared medically. Pt endorses having w/c RW and BSC already. Author issued gait belt for addition safety. Recommend HHPT to follow to conintue to maximize independence with ADLs.   Recommendations for follow up therapy are one component of a multi-disciplinary discharge planning process, led by the attending physician.  Recommendations may be updated based on patient status, additional functional criteria and insurance authorization.  Follow Up Recommendations  Home health PT     Assistance Recommended at Discharge Frequent or constant Supervision/Assistance  Patient can return home with the following A little help with walking and/or transfers;A lot of help  with bathing/dressing/bathroom;Assist for transportation;Assistance with cooking/housework;A little help with bathing/dressing/bathroom;Help with stairs or ramp for entrance   Equipment Recommendations  None recommended by PT (pt reports having w/c , RW, and BSC already at home)       Precautions / Restrictions Precautions Precautions: Fall Restrictions Weight Bearing Restrictions: Yes RLE Weight Bearing: Non weight bearing     Mobility  Bed Mobility    General bed mobility comments: in recliner pre/post session    Transfers Overall transfer level: Needs assistance Equipment used: Rolling walker (2 wheels) Transfers: Sit to/from Stand Sit to Stand: Supervision    General transfer comment: pt was able to stand/sit without physical assistance. vcs for technique improvements. pt was able to adhere to NWB throughout session    Ambulation/Gait Ambulation/Gait assistance: Supervision Gait Distance (Feet): 20 Feet Assistive device: Rolling walker (2 wheels) Gait Pattern/deviations:  (" hop to.") Gait velocity: decreased     General Gait Details: Pt was able to "hop" ~ 20 ft total. she has a w/c for home./community distances. did well with NWB   Stairs Stairs: Yes Stairs assistance: Min assist Stair Management: Backwards (bumping up on butt/ educated on how to perform with w/c also. unwilling to attempt hopping up) Number of Stairs: 2 General stair comments: pt has two stairs to enter home. unwilling to attempt hopping but was able to bump up backwards on way up and fwd on way down. stood from bottom step with min assist. issued gait belt for home use. demonstrated to spouse how to ascend/decsned with pt in w/c as well.    Balance Overall balance assessment: Needs assistance Sitting-balance support: Bilateral upper extremity supported Sitting balance-Leahy Scale: Good     Standing balance  support: Bilateral upper extremity supported, During functional activity Standing  balance-Leahy Scale: Fair Standing balance comment: Reliant on AD due to NWB precautions         Cognition Arousal/Alertness: Awake/alert Behavior During Therapy: WFL for tasks assessed/performed Overall Cognitive Status: Within Functional Limits for tasks assessed      General Comments: pt is A and O x 4           General Comments General comments (skin integrity, edema, etc.): lengthy discussion about importance of elevation and icing. encouraged movement of LEs to promote circulation. pt states understanding      Pertinent Vitals/Pain Pain Assessment Pain Assessment: 0-10 Pain Score: 6  Faces Pain Scale: Hurts a little bit Pain Location: RLE Pain Descriptors / Indicators: Aching, Discomfort, Grimacing, Throbbing Pain Intervention(s): Limited activity within patient's tolerance, Monitored during session, Premedicated before session, Repositioned, Ice applied     PT Goals (current goals can now be found in the care plan section) Acute Rehab PT Goals Patient Stated Goal: go home Progress towards PT goals: Progressing toward goals    Frequency    7X/week      PT Plan Current plan remains appropriate       AM-PAC PT "6 Clicks" Mobility   Outcome Measure  Help needed turning from your back to your side while in a flat bed without using bedrails?: A Little Help needed moving from lying on your back to sitting on the side of a flat bed without using bedrails?: A Little Help needed moving to and from a bed to a chair (including a wheelchair)?: A Little Help needed standing up from a chair using your arms (e.g., wheelchair or bedside chair)?: A Little Help needed to walk in hospital room?: A Little Help needed climbing 3-5 steps with a railing? : A Little 6 Click Score: 18    End of Session Equipment Utilized During Treatment: Gait belt (issued pt a gait belt) Activity Tolerance: Patient tolerated treatment well;Patient limited by pain;Other (comment) (limited by  NWB RLE) Patient left: in chair;with call bell/phone within reach;with chair alarm set Nurse Communication: Mobility status PT Visit Diagnosis: Other abnormalities of gait and mobility (R26.89);Muscle weakness (generalized) (M62.81);Difficulty in walking, not elsewhere classified (R26.2);Pain Pain - Right/Left: Right Pain - part of body: Ankle and joints of foot;Leg     Time: 0840-0906 PT Time Calculation (min) (ACUTE ONLY): 26 min  Charges:  $Gait Training: 8-22 mins $Therapeutic Activity: 8-22 mins                     Julaine Fusi PTA 10/28/21, 9:27 AM

## 2021-10-28 NOTE — Final Progress Note (Signed)
Patient and spouse verbalized understanding of discharge instructions. Pt was educated on keeping splint/dressing dry and clean until appt with MD. Pt was instructed to make follow up appt as well.

## 2021-10-28 NOTE — Plan of Care (Signed)
  Problem: Education: Goal: Ability to describe self-care measures that may prevent or decrease complications (Diabetes Survival Skills Education) will improve Outcome: Adequate for Discharge Goal: Individualized Educational Video(s) Outcome: Adequate for Discharge   Problem: Coping: Goal: Ability to adjust to condition or change in health will improve Outcome: Adequate for Discharge   Problem: Fluid Volume: Goal: Ability to maintain a balanced intake and output will improve Outcome: Adequate for Discharge   Problem: Health Behavior/Discharge Planning: Goal: Ability to identify and utilize available resources and services will improve Outcome: Adequate for Discharge Goal: Ability to manage health-related needs will improve Outcome: Adequate for Discharge   Problem: Metabolic: Goal: Ability to maintain appropriate glucose levels will improve Outcome: Adequate for Discharge   Problem: Nutritional: Goal: Maintenance of adequate nutrition will improve Outcome: Adequate for Discharge Goal: Progress toward achieving an optimal weight will improve Outcome: Adequate for Discharge   Problem: Skin Integrity: Goal: Risk for impaired skin integrity will decrease Outcome: Adequate for Discharge   Problem: Tissue Perfusion: Goal: Adequacy of tissue perfusion will improve Outcome: Adequate for Discharge   Problem: Education: Goal: Verbalization of understanding the information provided (i.e., activity precautions, restrictions, etc) will improve Outcome: Adequate for Discharge Goal: Individualized Educational Video(s) Outcome: Adequate for Discharge   Problem: Activity: Goal: Ability to ambulate and perform ADLs will improve Outcome: Adequate for Discharge   Problem: Clinical Measurements: Goal: Postoperative complications will be avoided or minimized Outcome: Adequate for Discharge   Problem: Self-Concept: Goal: Ability to maintain and perform role responsibilities to the  fullest extent possible will improve Outcome: Adequate for Discharge   Problem: Pain Management: Goal: Pain level will decrease Outcome: Adequate for Discharge   Problem: Education: Goal: Knowledge of General Education information will improve Description: Including pain rating scale, medication(s)/side effects and non-pharmacologic comfort measures Outcome: Adequate for Discharge   Problem: Health Behavior/Discharge Planning: Goal: Ability to manage health-related needs will improve Outcome: Adequate for Discharge   Problem: Clinical Measurements: Goal: Ability to maintain clinical measurements within normal limits will improve Outcome: Adequate for Discharge Goal: Will remain free from infection Outcome: Adequate for Discharge Goal: Diagnostic test results will improve Outcome: Adequate for Discharge Goal: Respiratory complications will improve Outcome: Adequate for Discharge Goal: Cardiovascular complication will be avoided Outcome: Adequate for Discharge   Problem: Activity: Goal: Risk for activity intolerance will decrease Outcome: Adequate for Discharge   Problem: Nutrition: Goal: Adequate nutrition will be maintained Outcome: Adequate for Discharge   Problem: Coping: Goal: Level of anxiety will decrease Outcome: Adequate for Discharge   Problem: Elimination: Goal: Will not experience complications related to bowel motility Outcome: Adequate for Discharge Goal: Will not experience complications related to urinary retention Outcome: Adequate for Discharge   Problem: Pain Managment: Goal: General experience of comfort will improve Outcome: Adequate for Discharge   Problem: Safety: Goal: Ability to remain free from injury will improve Outcome: Adequate for Discharge   Problem: Skin Integrity: Goal: Risk for impaired skin integrity will decrease Outcome: Adequate for Discharge

## 2021-10-30 ENCOUNTER — Encounter: Payer: Self-pay | Admitting: Orthopaedic Surgery

## 2021-10-30 ENCOUNTER — Telehealth: Payer: Self-pay | Admitting: *Deleted

## 2021-10-30 NOTE — Telephone Encounter (Signed)
Transition Care Management Follow-up Telephone Call Date of discharge and from where: [facility] 10-29-2022 How have you been since you were released from the hospital? Feeling ok Any questions or concerns? No  Items Reviewed: Did the pt receive and understand the discharge instructions provided? Yes  Medications obtained and verified? Yes  Other? No  Any new allergies since your discharge? No  Dietary orders reviewed? No Do you have support at home? Yes   Home Care and Equipment/Supplies: Were home health services ordered? YES If so, what is the name of the agency?   Has the agency set up a time to come to the patient's home?   NO Were any new equipment or medical supplies ordered?  PATIENT HAS WHEELCHAIR AT HOME What is the name of the medical supply agency?  Were you able to get the supplies/equipment?  Do you have any questions related to the use of the equipment or supplies?    Functional Questionnaire: (I = Independent and D = Dependent) ADLs: I  Bathing/Dressing- D  Meal Prep- D  Eating- I  Maintaining continence- I  Transferring/Ambulation- I  Managing Meds- I  Follow up appointments reviewed:  PCP Hospital f/u appt confirmed? Yes  Scheduled .WILL SCHEDULE AFTER [facility] f/u appt confirmed? Yes  Scheduled to see 11-08-2021 on 11:00 @ [facility]. Are transportation arrangements needed? No  If their condition worsens, is the pt aware to call PCP or go to the Emergency Dept.? Yes Was the patient provided with contact information for the PCP's office or ED? Yes Was to pt encouraged to call back with questions or concerns? Yes

## 2021-10-31 ENCOUNTER — Telehealth: Payer: Self-pay | Admitting: *Deleted

## 2021-10-31 DIAGNOSIS — S82201A Unspecified fracture of shaft of right tibia, initial encounter for closed fracture: Secondary | ICD-10-CM

## 2021-10-31 NOTE — Telephone Encounter (Signed)
Patient has scheduled a follow up appointment 10-2,    Was calling patient to see if [facility] has reach out to her yet.

## 2021-10-31 NOTE — Telephone Encounter (Signed)
Pt returning call  Pt states home health has not reached out to her.

## 2021-11-01 DIAGNOSIS — E785 Hyperlipidemia, unspecified: Secondary | ICD-10-CM | POA: Diagnosis not present

## 2021-11-01 DIAGNOSIS — S82201D Unspecified fracture of shaft of right tibia, subsequent encounter for closed fracture with routine healing: Secondary | ICD-10-CM | POA: Diagnosis not present

## 2021-11-01 DIAGNOSIS — F17210 Nicotine dependence, cigarettes, uncomplicated: Secondary | ICD-10-CM | POA: Diagnosis not present

## 2021-11-01 DIAGNOSIS — Z9181 History of falling: Secondary | ICD-10-CM | POA: Diagnosis not present

## 2021-11-01 DIAGNOSIS — E1169 Type 2 diabetes mellitus with other specified complication: Secondary | ICD-10-CM | POA: Diagnosis not present

## 2021-11-01 DIAGNOSIS — J449 Chronic obstructive pulmonary disease, unspecified: Secondary | ICD-10-CM | POA: Diagnosis not present

## 2021-11-01 DIAGNOSIS — E1159 Type 2 diabetes mellitus with other circulatory complications: Secondary | ICD-10-CM | POA: Diagnosis not present

## 2021-11-01 DIAGNOSIS — Z7982 Long term (current) use of aspirin: Secondary | ICD-10-CM | POA: Diagnosis not present

## 2021-11-01 DIAGNOSIS — E1140 Type 2 diabetes mellitus with diabetic neuropathy, unspecified: Secondary | ICD-10-CM | POA: Diagnosis not present

## 2021-11-01 DIAGNOSIS — S82401D Unspecified fracture of shaft of right fibula, subsequent encounter for closed fracture with routine healing: Secondary | ICD-10-CM | POA: Diagnosis not present

## 2021-11-01 DIAGNOSIS — I152 Hypertension secondary to endocrine disorders: Secondary | ICD-10-CM | POA: Diagnosis not present

## 2021-11-01 DIAGNOSIS — G2111 Neuroleptic induced parkinsonism: Secondary | ICD-10-CM | POA: Diagnosis not present

## 2021-11-01 NOTE — Addendum Note (Signed)
Addended by: Marnee Guarneri T on: 11/01/2021 05:15 PM   Modules accepted: Orders

## 2021-11-06 ENCOUNTER — Other Ambulatory Visit: Payer: Self-pay | Admitting: Nurse Practitioner

## 2021-11-06 ENCOUNTER — Ambulatory Visit (INDEPENDENT_AMBULATORY_CARE_PROVIDER_SITE_OTHER): Payer: Medicare Other | Admitting: Physician Assistant

## 2021-11-06 ENCOUNTER — Encounter: Payer: Self-pay | Admitting: Physician Assistant

## 2021-11-06 VITALS — BP 130/80 | HR 89 | Temp 98.7°F

## 2021-11-06 DIAGNOSIS — E1169 Type 2 diabetes mellitus with other specified complication: Secondary | ICD-10-CM

## 2021-11-06 DIAGNOSIS — S82201D Unspecified fracture of shaft of right tibia, subsequent encounter for closed fracture with routine healing: Secondary | ICD-10-CM | POA: Diagnosis not present

## 2021-11-06 DIAGNOSIS — E669 Obesity, unspecified: Secondary | ICD-10-CM

## 2021-11-06 DIAGNOSIS — Z1231 Encounter for screening mammogram for malignant neoplasm of breast: Secondary | ICD-10-CM | POA: Diagnosis not present

## 2021-11-06 DIAGNOSIS — R739 Hyperglycemia, unspecified: Secondary | ICD-10-CM

## 2021-11-06 DIAGNOSIS — S82401D Unspecified fracture of shaft of right fibula, subsequent encounter for closed fracture with routine healing: Secondary | ICD-10-CM

## 2021-11-06 MED ORDER — BLOOD GLUCOSE METER KIT: PACK | 0 refills | Status: DC

## 2021-11-06 MED ORDER — METFORMIN HCL 500 MG PO TABS: 500.0000 mg | ORAL_TABLET | Freq: Two times a day (BID) | ORAL | 3 refills | Status: DC

## 2021-11-06 NOTE — Assessment & Plan Note (Signed)
Chronic, historic condition Patient was previously well controlled without medications but most recent A1c was 7.0  Will add metformin 500 mg PO BID for management- pt states she has done well on this in the past  Will send in new script for glucose meter and strips for home monitoring Follow up in 3 months for monitoring and to assess response to Metformin restart.

## 2021-11-06 NOTE — Progress Notes (Signed)
Established Patient Office Visit  Name: Vanessa Baker   MRN: [id number]    DOB: Jan 07, 1976   Date:11/06/2021  Today's Provider: Talitha Givens, MHS, PA-C Introduced myself to the patient as a PA-C and provided education on APPs in clinical practice.         Subjective  Chief Complaint  Chief Complaint  Patient presents with   Hospitalization Follow-up    Patient fell and fractured her Tib/Fib on 9/21. Patient states that while she was in the hospital her blood sugars were running high     HPI  Reports she fell and broke tib/fib  She has had surgery to correct fracture  She has scheduled follow up with Ortho for post-op eval She has a nurse coming to home - she is not sure why   Diabetes States in the hospital she was having elevated blood glucose levels- reports they were in 180s and up  She has not been able to check at home because she has not had a meter She reports she has not been on medications for some time- per chart review, PCP was managing with lifestyle as A1c was 5.8 Review of most recent results shows A1c at 7.0 in Sep 2023      Patient Active Problem List   Diagnosis Date Noted   Fracture tibia/fibula, right, closed, with routine healing, subsequent encounter 10/26/2021   Tobacco abuse 10/26/2021   Diabetic neuropathy ([place]) 10/26/2021   Leukocytosis 10/26/2021   Bipolar 1 disorder, mixed, moderate ([place]) 12/08/2020   Vitamin B12 deficiency 12/16/2019   Gastroesophageal reflux disease 12/15/2019   Nicotine dependence, cigarettes, uncomplicated 70/17/7939   Financial difficulties 08/25/2019   Class 1 obesity 08/25/2019   S/P laparoscopic cholecystectomy 04/29/2019   High risk medication use 04/09/2019   Cocaine use disorder, moderate, in sustained remission ([place]) 03/16/2019   Chronic hip pain, bilateral 01/09/2019   Type 2 diabetes mellitus with obesity ([place]) 12/23/2018   Restless leg syndrome 11/17/2018   Cannabis use disorder, mild, in  early remission 08/06/2018   Alcohol use disorder, mild, in early remission 08/06/2018   Neuroleptic induced parkinsonism ([place]) 08/06/2018   PTSD (post-traumatic stress disorder) 08/06/2018   Migraine 04/02/2017   Hypertension associated with diabetes ([place]) 03/28/2017   Moderate persistent asthma without complication 03/00/9233   Hyperlipidemia associated with type 2 diabetes mellitus ([place]) 05/17/2015   Insomnia 03/31/2015   Regular astigmatism 05/05/2010    Past Surgical History:  Procedure Laterality Date   CHOLECYSTECTOMY N/A 04/27/2019   Procedure: LAPAROSCOPIC CHOLECYSTECTOMY;  Surgeon: Fredirick Maudlin, MD;  Location: [facility] ORS;  Service: General;  Laterality: N/A;   TIBIA IM NAIL INSERTION Right 10/26/2021   Procedure: INTRAMEDULLARY (IM) NAIL TIBIAL;  Surgeon: Renee Harder, MD;  Location: [facility] ORS;  Service: Orthopedics;  Laterality: Right;    Family History  Problem Relation Age of Onset   Bipolar disorder Mother    Schizophrenia Mother    Hypertension Mother    Diabetes Mother    Cancer Mother    Anxiety disorder Mother    ADD / ADHD Mother    Alcohol abuse Mother    Drug abuse Mother    Heart disease Mother    Bipolar disorder Sister    Anxiety disorder Sister    Drug abuse Sister    Bipolar disorder Brother    Anxiety disorder Brother    Drug abuse Brother    Hypertension Father    Cancer Father  Diabetes Father    Breast cancer Maternal Aunt    Leukemia Maternal Aunt    Breast cancer Paternal Aunt    Breast cancer Maternal Grandmother    Alcohol abuse Maternal Grandmother    Bipolar disorder Maternal Grandmother    Breast cancer Paternal Grandmother    Parkinson's disease Paternal Grandmother    Alcohol abuse Maternal Grandfather    Prostate cancer Maternal Grandfather    Alcohol abuse Maternal Uncle    Multiple myeloma Maternal Uncle    ADD / ADHD Son    Throat cancer Maternal Aunt    Tremor Neg Hx     Social History   Tobacco Use    Smoking status: Former    Packs/day: 0.50    Years: 27.00    Total pack years: 13.50    Types: Cigarettes    Quit date: 04/26/2019    Years since quitting: 2.5   Smokeless tobacco: Never  Substance Use Topics   Alcohol use: Yes     Current Outpatient Medications:    albuterol (VENTOLIN HFA) 108 (90 Base) MCG/ACT inhaler, INHALE 2 PUFFS BY MOUTH EVERY 6 HOURS AS NEEDED FOR WHEEZING AND FOR SHORTNESS OF BREATH, Disp: 18 g, Rfl: 4   aspirin EC 325 MG tablet, Take 1 tablet (325 mg total) by mouth daily with breakfast for 28 days., Disp: 28 tablet, Rfl: 0   atorvastatin (LIPITOR) 40 MG tablet, Take 1 tablet (40 mg total) by mouth daily., Disp: 90 tablet, Rfl: 4   baclofen (LIORESAL) 10 MG tablet, Take 1 tablet (10 mg total) by mouth 3 (three) times daily as needed. for muscle spams, Disp: 90 tablet, Rfl: 4   benztropine (COGENTIN) 1 MG tablet, Take 1 mg by mouth at bedtime., Disp: , Rfl:    carbamazepine (TEGRETOL) 200 MG tablet, Take 1 tablet daily by mouth AM and 1.5 tablets by mouth daily PM., Disp: 215 tablet, Rfl: 4   docusate sodium (COLACE) 100 MG capsule, Take 1 capsule (100 mg total) by mouth 2 (two) times daily as needed for mild constipation., Disp: 20 capsule, Rfl: 0   fluticasone furoate-vilanterol (BREO ELLIPTA) 100-25 MCG/ACT AEPB, Inhale 1 puff into the lungs daily., Disp: 60 each, Rfl: 4   gabapentin (NEURONTIN) 600 MG tablet, Take 600 mg by mouth See admin instructions. Take 600 MG (one tablet) by mouth at 6 pm and 1200 MG (two tablets) by mouth at bedtime., Disp: , Rfl:    metFORMIN (GLUCOPHAGE) 500 MG tablet, Take 1 tablet (500 mg total) by mouth 2 (two) times daily with a meal., Disp: 180 tablet, Rfl: 3   methocarbamol (ROBAXIN-750) 750 MG tablet, Take 1 tablet (750 mg total) by mouth 3 (three) times daily for 21 days., Disp: 63 tablet, Rfl: 0   Methylcobalamin (B-12) 1000 MCG TBDP, Take 1,000 mcg by mouth daily., Disp: 90 tablet, Rfl: 4   OLANZapine (ZYPREXA) 20 MG tablet,  Take 1 tablet (20 mg total) by mouth at bedtime., Disp: 90 tablet, Rfl: 4   omega-3 acid ethyl esters (LOVAZA) 1 g capsule, Take 1 capsule (1 g total) by mouth 2 (two) times daily., Disp: 180 capsule, Rfl: 4   omeprazole (PRILOSEC) 20 MG capsule, Take 1 capsule by mouth once daily, Disp: 90 capsule, Rfl: 1   pregabalin (LYRICA) 100 MG capsule, Take 100 mg by mouth 2 (two) times daily., Disp: , Rfl:    primidone (MYSOLINE) 50 MG tablet, TAKE 1 TABLET BY MOUTH AT BEDTIME, Disp: 90 tablet, Rfl: 4  rOPINIRole (REQUIP) 2 MG tablet, Take 1 tablet (2 mg total) by mouth at bedtime., Disp: 30 tablet, Rfl: 12   sertraline (ZOLOFT) 50 MG tablet, Take 1 tablet (50 mg total) by mouth daily., Disp: 90 tablet, Rfl: 2   traZODone (DESYREL) 100 MG tablet, Take 2 tablets (200 mg total) by mouth at bedtime., Disp: 180 tablet, Rfl: 4   blood glucose meter kit and supplies, Dispense based on patient and insurance preference. Use up to four times daily as directed. (FOR ICD-10 E10.9, E11.9)., Disp: 1 each, Rfl: 0   Blood Glucose Monitoring Suppl (ACCU-CHEK AVIVA PLUS) w/Device KIT, Use to check blood sugar up to 3 times daily, Disp: 1 kit, Rfl: 0   lisinopril (ZESTRIL) 5 MG tablet, Take 5 mg by mouth daily. (Patient not taking: Reported on 11/06/2021), Disp: , Rfl:   Allergies  Allergen Reactions   Amitriptyline Other (See Comments)    Confusion, paralysis   Wellbutrin [Bupropion] Nausea Only    falls    I personally reviewed active problem list, medication list, allergies, notes from last encounter, notes from last 3 encounters, lab results with the patient/caregiver today.   Review of Systems  Eyes:  Negative for blurred vision and double vision.  Respiratory:  Negative for shortness of breath and wheezing.   Cardiovascular:  Negative for chest pain and leg swelling.  Gastrointestinal:  Negative for diarrhea, nausea and vomiting.  Musculoskeletal:  Positive for myalgias.  Neurological:  Negative for  dizziness, weakness and headaches.      Objective  Vitals:   11/06/21 1327 11/06/21 1331  BP: (!) 148/85 130/80  Pulse: 84 89  Temp: 98.7 F (37.1 C)   TempSrc: Oral   SpO2: 96%     There is no height or weight on file to calculate BMI.  Physical Exam Vitals reviewed.  Constitutional:      General: She is awake.     Appearance: Normal appearance. She is well-developed and well-groomed.  HENT:     Head: Normocephalic and atraumatic.  Pulmonary:     Effort: Pulmonary effort is normal.  Musculoskeletal:     Cervical back: Normal range of motion.     Comments: Right lower leg is wrapped in ace bandages and appears to have rigid support inside due to recent injury   Neurological:     General: No focal deficit present.     Mental Status: She is alert and oriented to person, place, and time.     GCS: GCS eye subscore is 4. GCS verbal subscore is 5. GCS motor subscore is 6.     Cranial Nerves: No dysarthria or facial asymmetry.  Psychiatric:        Attention and Perception: Attention and perception normal.        Mood and Affect: Mood and affect normal.        Speech: Speech normal.        Behavior: Behavior normal. Behavior is cooperative.      Recent Results (from the past 2160 hour(s))  Fecal Occult Blood, [place]     Status: None   Collection Time: 09/03/21 12:00 AM  Result Value Ref Range   Fecal Occult Blood Negative   CBC with Differential/Platelet     Status: Abnormal   Collection Time: 10/26/21  5:49 AM  Result Value Ref Range   WBC 12.6 (H) 4.0 - 10.5 K/uL   RBC 5.03 3.87 - 5.11 MIL/uL   Hemoglobin 15.3 (H) 12.0 - 15.0 g/dL  HCT 46.1 (H) 36.0 - 46.0 %   MCV 91.7 80.0 - 100.0 fL   MCH 30.4 26.0 - 34.0 pg   MCHC 33.2 30.0 - 36.0 g/dL   RDW 12.7 11.5 - 15.5 %   Platelets 290 150 - 400 K/uL   nRBC 0.0 0.0 - 0.2 %   Neutrophils Relative % 74 %   Neutro Abs 9.3 (H) 1.7 - 7.7 K/uL   Lymphocytes Relative 17 %   Lymphs Abs 2.2 0.7 - 4.0 K/uL   Monocytes  Relative 5 %   Monocytes Absolute 0.7 0.1 - 1.0 K/uL   Eosinophils Relative 1 %   Eosinophils Absolute 0.1 0.0 - 0.5 K/uL   Basophils Relative 2 %   Basophils Absolute 0.3 (H) 0.0 - 0.1 K/uL   Immature Granulocytes 1 %   Abs Immature Granulocytes 0.08 (H) 0.00 - 0.07 K/uL    Comment: Performed at [facility], [address], [place], [place] [address]  Basic metabolic panel     Status: Abnormal   Collection Time: 10/26/21  5:49 AM  Result Value Ref Range   Sodium 139 135 - 145 mmol/L   Potassium 4.1 3.5 - 5.1 mmol/L   Chloride 105 98 - 111 mmol/L   CO2 26 22 - 32 mmol/L   Glucose, Bld 161 (H) 70 - 99 mg/dL    Comment: Glucose reference range applies only to samples taken after fasting for at least 8 hours.   BUN 12 6 - 20 mg/dL   Creatinine, Ser 0.59 0.44 - 1.00 mg/dL   Calcium 8.8 (L) 8.9 - 10.3 mg/dL   GFR, Estimated >60 >60 mL/min    Comment: (NOTE) Calculated using the CKD-EPI Creatinine Equation (2021)    Anion gap 8 5 - 15    Comment: Performed at [facility], [address] [place], [place] [address]  Type and screen [place]     Status: None   Collection Time: 10/26/21  5:49 AM  Result Value Ref Range   ABO/RH(D) A POS    Antibody Screen NEG    Sample Expiration      10/29/2021,2359 Performed at [facility], [address] [place], [place] [address]   Protime-INR     Status: None   Collection Time: 10/26/21  5:49 AM  Result Value Ref Range   Prothrombin Time 13.6 11.4 - 15.2 seconds   INR 1.1 0.8 - 1.2    Comment: (NOTE) INR goal varies based on device and disease states. Performed at [facility], [address] [place], [place] [address]   Hemoglobin A1c     Status: Abnormal   Collection Time: 10/26/21  5:49 AM  Result Value Ref Range   Hgb A1c MFr Bld 7.0 (H) 4.8 - 5.6 %    Comment: (NOTE) Pre diabetes:          5.7%-6.4%  Diabetes:              >6.4%  Glycemic control for    <7.0% adults with diabetes    Mean Plasma Glucose 154.2 mg/dL    Comment: Performed at [facility] [address] [place], [place] [address]  CBG monitoring, ED     Status: Abnormal   Collection Time: 10/26/21 10:56 AM  Result Value Ref Range   Glucose-Capillary 186 (H) 70 - 99 mg/dL    Comment: Glucose reference range applies only to samples taken after fasting for at least 8 hours.  HIV Antibody (routine testing w rflx)     Status: None   Collection Time: 10/26/21 12:21 PM  Result Value Ref Range   HIV Screen 4th Generation wRfx Non Reactive Non Reactive    Comment: Performed at [facility], [place] [address], [place], [place] [address]  Glucose, capillary     Status: Abnormal   Collection Time: 10/26/21  3:44 PM  Result Value Ref Range   Glucose-Capillary 138 (H) 70 - 99 mg/dL    Comment: Glucose reference range applies only to samples taken after fasting for at least 8 hours.  CBG monitoring, ED     Status: Abnormal   Collection Time: 10/26/21  6:51 PM  Result Value Ref Range   Glucose-Capillary 176 (H) 70 - 99 mg/dL    Comment: Glucose reference range applies only to samples taken after fasting for at least 8 hours.   Comment 1 Notify RN    Comment 2 Document in Chart   CBG monitoring, ED     Status: Abnormal   Collection Time: 10/26/21  8:26 PM  Result Value Ref Range   Glucose-Capillary 217 (H) 70 - 99 mg/dL    Comment: Glucose reference range applies only to samples taken after fasting for at least 8 hours.  Glucose, capillary     Status: Abnormal   Collection Time: 10/26/21  9:56 PM  Result Value Ref Range   Glucose-Capillary 222 (H) 70 - 99 mg/dL    Comment: Glucose reference range applies only to samples taken after fasting for at least 8 hours.  Basic metabolic panel     Status: Abnormal   Collection Time: 10/27/21  5:36 AM  Result Value Ref Range   Sodium 138 135 - 145 mmol/L   Potassium 4.1 3.5 - 5.1 mmol/L   Chloride 105 98 - 111 mmol/L   CO2 24  22 - 32 mmol/L   Glucose, Bld 166 (H) 70 - 99 mg/dL    Comment: Glucose reference range applies only to samples taken after fasting for at least 8 hours.   BUN 8 6 - 20 mg/dL   Creatinine, Ser 0.59 0.44 - 1.00 mg/dL   Calcium 8.2 (L) 8.9 - 10.3 mg/dL   GFR, Estimated >60 >60 mL/min    Comment: (NOTE) Calculated using the CKD-EPI Creatinine Equation (2021)    Anion gap 9 5 - 15    Comment: Performed at [facility], [address] [place], [place] [address]  CBC     Status: Abnormal   Collection Time: 10/27/21  5:36 AM  Result Value Ref Range   WBC 12.7 (H) 4.0 - 10.5 K/uL   RBC 4.60 3.87 - 5.11 MIL/uL   Hemoglobin 13.8 12.0 - 15.0 g/dL   HCT 42.6 36.0 - 46.0 %   MCV 92.6 80.0 - 100.0 fL   MCH 30.0 26.0 - 34.0 pg   MCHC 32.4 30.0 - 36.0 g/dL   RDW 12.8 11.5 - 15.5 %   Platelets 256 150 - 400 K/uL   nRBC 0.0 0.0 - 0.2 %    Comment: Performed at [facility], [address] [place],  [address]  Glucose, capillary     Status: Abnormal   Collection Time: 10/27/21  7:49 AM  Result Value Ref Range   Glucose-Capillary 150 (H) 70 - 99 mg/dL    Comment: Glucose reference range applies only to samples taken after fasting for at least 8 hours.  Glucose, capillary     Status:  Abnormal   Collection Time: 10/27/21 11:46 AM  Result Value Ref Range   Glucose-Capillary 230 (H) 70 - 99 mg/dL    Comment: Glucose reference range applies only to samples taken after fasting for at least 8 hours.  Glucose, capillary     Status: Abnormal   Collection Time: 10/27/21  5:18 PM  Result Value Ref Range   Glucose-Capillary 182 (H) 70 - 99 mg/dL    Comment: Glucose reference range applies only to samples taken after fasting for at least 8 hours.  Glucose, capillary     Status: Abnormal   Collection Time: 10/27/21  9:34 PM  Result Value Ref Range   Glucose-Capillary 183 (H) 70 - 99 mg/dL    Comment: Glucose reference range applies only to samples taken after fasting for at  least 8 hours.   Comment 1 Notify RN   Urinalysis, Complete w Microscopic     Status: Abnormal   Collection Time: 10/28/21  1:29 AM  Result Value Ref Range   Color, Urine YELLOW YELLOW   APPearance CLEAR CLEAR   Specific Gravity, Urine 1.020 1.005 - 1.030   pH 6.0 5.0 - 8.0   Glucose, UA NEGATIVE NEGATIVE mg/dL   Hgb urine dipstick MODERATE (A) NEGATIVE   Bilirubin Urine NEGATIVE NEGATIVE   Ketones, ur NEGATIVE NEGATIVE mg/dL   Protein, ur NEGATIVE NEGATIVE mg/dL   Nitrite NEGATIVE NEGATIVE   Leukocytes,Ua NEGATIVE NEGATIVE   Squamous Epithelial / LPF 0-5 0 - 5   WBC, UA 0-5 0 - 5 WBC/hpf   RBC / HPF 6-10 0 - 5 RBC/hpf   Bacteria, UA RARE (A) NONE SEEN   Mucus PRESENT     Comment: Performed at [facility], [address] [place], [place] [address]  Glucose, capillary     Status: Abnormal   Collection Time: 10/28/21  8:00 AM  Result Value Ref Range   Glucose-Capillary 261 (H) 70 - 99 mg/dL    Comment: Glucose reference range applies only to samples taken after fasting for at least 8 hours.     PHQ2/9:    11/06/2021    1:37 PM 05/26/2021    1:29 PM 04/10/2021    2:23 PM 01/16/2021    9:28 AM 09/06/2020    1:18 PM  Depression screen PHQ 2/9  Decreased Interest 2 2 1 3    Down, Depressed, Hopeless 2 1 1 2    PHQ - 2 Score 4 3 2 5    Altered sleeping 3 1 3 2    Tired, decreased energy 3 1 1 2    Change in appetite 3 2 2 1    Feeling bad or failure about yourself  2 0 1    Trouble concentrating 2 1 1  0   Moving slowly or fidgety/restless 1 0 1 0   Suicidal thoughts 0 0 0 0   PHQ-9 Score 18 8 11 10    Difficult doing work/chores Somewhat difficult   Somewhat difficult      Information is confidential and restricted. Go to Review Flowsheets to unlock data.      Fall Risk:    11/06/2021    1:37 PM 11/06/2021    1:29 PM 05/26/2021    1:29 PM 01/16/2021    9:32 AM 02/25/2020   10:30 AM  Fall Risk   Falls in the past year? 1 1 0 0 1  Number falls in past yr: 1 1 0 0 1   Injury with Fall? 1 1 0 0 0  Risk  for fall due to : Orthopedic patient;Impaired mobility Impaired mobility;Orthopedic patient No Fall Risks    Follow up Falls evaluation completed Falls evaluation completed Falls evaluation completed Falls evaluation completed;Falls prevention discussed       Functional Status Survey:      Assessment & Plan  Problem List Items Addressed This Visit       Endocrine   Type 2 diabetes mellitus with obesity ([place]) - Primary    Chronic, historic condition Patient was previously well controlled without medications but most recent A1c was 7.0  Will add metformin 500 mg PO BID for management- pt states she has done well on this in the past  Will send in new script for glucose meter and strips for home monitoring Follow up in 3 months for monitoring and to assess response to Metformin restart.       Relevant Medications   blood glucose meter kit and supplies   metFORMIN (GLUCOPHAGE) 500 MG tablet     Musculoskeletal and Integument   Fracture tibia/fibula, right, closed, with routine healing, subsequent encounter    New, ongoing concern Patient has been evaluated by Ortho and received surgery for this  Will defer to Ortho management strategy for this and continue collaboration Recommend she keep follow up apts with surgical team for monitoring and evaluation Follow up as needed.       Other Visit Diagnoses     Hyperglycemia       Relevant Medications   blood glucose meter kit and supplies   Breast cancer screening by mammogram       Relevant Orders   MM Digital Screening        Return in about 3 months (around 02/06/2022) for Diabetes follow up, pap .   I, Annalyce Lanpher E Chace Bisch, PA-C, have reviewed all documentation for this visit. The documentation on 11/06/21 for the exam, diagnosis, procedures, and orders are all accurate and complete.   Talitha Givens, MHS, PA-C [facility]

## 2021-11-06 NOTE — Assessment & Plan Note (Signed)
New, ongoing concern Patient has been evaluated by Ortho and received surgery for this  Will defer to Ortho management strategy for this and continue collaboration Recommend she keep follow up apts with surgical team for monitoring and evaluation Follow up as needed.

## 2021-11-06 NOTE — Telephone Encounter (Signed)
Requested Prescriptions  Pending Prescriptions Disp Refills  . blood glucose meter kit and supplies 1 each 0    Sig: Dispense based on patient and insurance preference. Use up to four times daily as directed. (FOR ICD-10 E10.9, E11.9).     Endocrinology: Diabetes - Testing Supplies Passed - 11/06/2021  5:02 PM      Passed - Valid encounter within last 12 months    Recent Outpatient Visits          Today Type 2 diabetes mellitus with obesity ([place])   [facility] Mecum, Erin E, PA-C   5 months ago Type 2 diabetes mellitus with obesity ([place])   [place] Wal[place], Jolene T, NP   7 months ago Bipolar 1 disorder, mixed, moderate ([place])   [place], Jolene T, NP   7 months ago Bipolar 1 disorder, mixed, moderate ([place])   [place] Cannady, Jolene T, NP   8 months ago Type 2 diabetes mellitus with obesity ([place])   [place], Barbaraann Faster, NP      Future Appointments            In 3 months Cannady, Barbaraann Faster, NP [employer]

## 2021-11-06 NOTE — Patient Instructions (Addendum)
Please call the [facility] to schedule your mammogram  Your Pap smear is due in November. You can have that done here if you would like - please schedule an apt with your PCP to get this performed

## 2021-11-06 NOTE — Telephone Encounter (Signed)
Copied from [place] [id number]. Topic: General - Other >> Nov 06, 2021  3:58 PM Everette C wrote: Reason for CRM: Medication Refill - Medication: blood glucose meter kit and supplies [[id number]]   Has the patient contacted their pharmacy? No. (Agent: If no, request that the patient contact the pharmacy for the refill. If patient does not wish to contact the pharmacy document the reason why and proceed with request.) (Agent: If yes, when and what did the pharmacy advise?)  Preferred Pharmacy (with phone number or street name): [place], [place] - [address] [place] [address] Phone: [phone] Fax: [phone] Hours: Not open 24 hours   Has the patient been seen for an appointment in the last year OR does the patient have an upcoming appointment? Yes.    Agent: Please be advised that RX refills may take up to 3 business days. We ask that you follow-up with your pharmacy.

## 2021-11-06 NOTE — Telephone Encounter (Signed)
Lucan called and spoke to [place], [facility] about the refill(s) blood glucose meter kit and supplies requested. Advised it was sent on 11/06/21 #1/0 refill(s). She says it was not received. Will attempt to send electronically.

## 2021-11-07 ENCOUNTER — Telehealth: Payer: Self-pay | Admitting: Nurse Practitioner

## 2021-11-07 NOTE — Telephone Encounter (Signed)
Left message for Dijuana from [facility] providing OK for verbal orders per Jolene. Advise Dijuana to give our office a call back if she has any questions or concerns.

## 2021-11-07 NOTE — Telephone Encounter (Signed)
Dijuana from [facility]  Best contact: [phone] ext [place] requested to wait until October 4th to begin her in home PT

## 2021-11-08 ENCOUNTER — Telehealth: Payer: Self-pay | Admitting: Nurse Practitioner

## 2021-11-08 DIAGNOSIS — I152 Hypertension secondary to endocrine disorders: Secondary | ICD-10-CM | POA: Diagnosis not present

## 2021-11-08 DIAGNOSIS — J449 Chronic obstructive pulmonary disease, unspecified: Secondary | ICD-10-CM | POA: Diagnosis not present

## 2021-11-08 DIAGNOSIS — E1169 Type 2 diabetes mellitus with other specified complication: Secondary | ICD-10-CM | POA: Diagnosis not present

## 2021-11-08 DIAGNOSIS — Z7982 Long term (current) use of aspirin: Secondary | ICD-10-CM | POA: Diagnosis not present

## 2021-11-08 DIAGNOSIS — J4540 Moderate persistent asthma, uncomplicated: Secondary | ICD-10-CM | POA: Diagnosis not present

## 2021-11-08 DIAGNOSIS — G43909 Migraine, unspecified, not intractable, without status migrainosus: Secondary | ICD-10-CM | POA: Diagnosis not present

## 2021-11-08 DIAGNOSIS — S82831D Other fracture of upper and lower end of right fibula, subsequent encounter for closed fracture with routine healing: Secondary | ICD-10-CM | POA: Diagnosis not present

## 2021-11-08 DIAGNOSIS — D72829 Elevated white blood cell count, unspecified: Secondary | ICD-10-CM | POA: Diagnosis not present

## 2021-11-08 DIAGNOSIS — S82301D Unspecified fracture of lower end of right tibia, subsequent encounter for closed fracture with routine healing: Secondary | ICD-10-CM | POA: Diagnosis not present

## 2021-11-08 DIAGNOSIS — F17210 Nicotine dependence, cigarettes, uncomplicated: Secondary | ICD-10-CM | POA: Diagnosis not present

## 2021-11-08 DIAGNOSIS — Z9181 History of falling: Secondary | ICD-10-CM | POA: Diagnosis not present

## 2021-11-08 DIAGNOSIS — K219 Gastro-esophageal reflux disease without esophagitis: Secondary | ICD-10-CM | POA: Diagnosis not present

## 2021-11-08 DIAGNOSIS — E1140 Type 2 diabetes mellitus with diabetic neuropathy, unspecified: Secondary | ICD-10-CM | POA: Diagnosis not present

## 2021-11-08 DIAGNOSIS — E1159 Type 2 diabetes mellitus with other circulatory complications: Secondary | ICD-10-CM | POA: Diagnosis not present

## 2021-11-08 DIAGNOSIS — G2581 Restless legs syndrome: Secondary | ICD-10-CM | POA: Diagnosis not present

## 2021-11-08 DIAGNOSIS — E785 Hyperlipidemia, unspecified: Secondary | ICD-10-CM | POA: Diagnosis not present

## 2021-11-08 NOTE — Telephone Encounter (Signed)
Copied from [place] [id number]. Topic: General - Other >> Nov 08, 2021 12:56 PM Tiffany B wrote: Confirm the discipline of start of care. Orders were unclear

## 2021-11-08 NOTE — Telephone Encounter (Signed)
Home Health Verbal Orders - Caller/Agency: [name] with [facility] Number: [phone] Requesting PT Frequency: 1 X a week for 8 weeks

## 2021-11-08 NOTE — Telephone Encounter (Signed)
Left message for Vanessa Baker from [facility] in regards to clarification of orders per [facility] request. Advised to give our office a call back if she has any questions or concerns.

## 2021-11-09 ENCOUNTER — Encounter: Payer: Self-pay | Admitting: Emergency Medicine

## 2021-11-09 ENCOUNTER — Other Ambulatory Visit: Payer: Self-pay

## 2021-11-09 ENCOUNTER — Emergency Department: Payer: Medicare Other

## 2021-11-09 ENCOUNTER — Emergency Department
Admission: EM | Admit: 2021-11-09 | Discharge: 2021-11-09 | Disposition: A | Discharge status: Home / Self Care | Payer: Medicare Other | Attending: Emergency Medicine | Admitting: Emergency Medicine

## 2021-11-09 DIAGNOSIS — R600 Localized edema: Secondary | ICD-10-CM | POA: Diagnosis not present

## 2021-11-09 DIAGNOSIS — R0602 Shortness of breath: Secondary | ICD-10-CM | POA: Diagnosis not present

## 2021-11-09 DIAGNOSIS — W19XXXA Unspecified fall, initial encounter: Secondary | ICD-10-CM | POA: Diagnosis not present

## 2021-11-09 DIAGNOSIS — L299 Pruritus, unspecified: Secondary | ICD-10-CM | POA: Diagnosis not present

## 2021-11-09 DIAGNOSIS — R079 Chest pain, unspecified: Secondary | ICD-10-CM | POA: Insufficient documentation

## 2021-11-09 DIAGNOSIS — M7989 Other specified soft tissue disorders: Secondary | ICD-10-CM | POA: Diagnosis not present

## 2021-11-09 DIAGNOSIS — R0902 Hypoxemia: Secondary | ICD-10-CM | POA: Diagnosis not present

## 2021-11-09 DIAGNOSIS — R21 Rash and other nonspecific skin eruption: Secondary | ICD-10-CM | POA: Diagnosis not present

## 2021-11-09 DIAGNOSIS — R609 Edema, unspecified: Secondary | ICD-10-CM | POA: Diagnosis not present

## 2021-11-09 LAB — BRAIN NATRIURETIC PEPTIDE: B Natriuretic Peptide: 15.8 pg/mL (ref 0.0–100.0)

## 2021-11-09 LAB — CBC
HCT: 45.7 % (ref 36.0–46.0)
Hemoglobin: 15.1 g/dL — ABNORMAL HIGH (ref 12.0–15.0)
MCH: 30 pg (ref 26.0–34.0)
MCHC: 33 g/dL (ref 30.0–36.0)
MCV: 90.9 fL (ref 80.0–100.0)
Platelets: 459 10*3/uL — ABNORMAL HIGH (ref 150–400)
RBC: 5.03 MIL/uL (ref 3.87–5.11)
RDW: 12.5 % (ref 11.5–15.5)
WBC: 13.5 10*3/uL — ABNORMAL HIGH (ref 4.0–10.5)
nRBC: 0 % (ref 0.0–0.2)

## 2021-11-09 LAB — URINALYSIS, ROUTINE W REFLEX MICROSCOPIC
Bilirubin Urine: NEGATIVE
Glucose, UA: NEGATIVE mg/dL
Ketones, ur: NEGATIVE mg/dL
Nitrite: NEGATIVE
Protein, ur: 30 mg/dL — AB
Specific Gravity, Urine: 1.023 (ref 1.005–1.030)
pH: 5 (ref 5.0–8.0)

## 2021-11-09 LAB — HEPATIC FUNCTION PANEL
ALT: 20 U/L (ref 0–44)
AST: 28 U/L (ref 15–41)
Albumin: 3.5 g/dL (ref 3.5–5.0)
Alkaline Phosphatase: 113 U/L (ref 38–126)
Bilirubin, Direct: 0.1 mg/dL (ref 0.0–0.2)
Indirect Bilirubin: 0.6 mg/dL (ref 0.3–0.9)
Total Bilirubin: 0.7 mg/dL (ref 0.3–1.2)
Total Protein: 6.9 g/dL (ref 6.5–8.1)

## 2021-11-09 LAB — BASIC METABOLIC PANEL
Anion gap: 9 (ref 5–15)
BUN: 11 mg/dL (ref 6–20)
CO2: 25 mmol/L (ref 22–32)
Calcium: 9.1 mg/dL (ref 8.9–10.3)
Chloride: 101 mmol/L (ref 98–111)
Creatinine, Ser: 0.63 mg/dL (ref 0.44–1.00)
GFR, Estimated: >60 mL/min (ref >60)
Glucose, Bld: 214 mg/dL — ABNORMAL HIGH (ref 70–99)
Potassium: 4 mmol/L (ref 3.5–5.1)
Sodium: 135 mmol/L (ref 135–145)

## 2021-11-09 LAB — TROPONIN I (HIGH SENSITIVITY): Troponin I (High Sensitivity): 4 ng/L (ref <18)

## 2021-11-09 MED ORDER — OXYCODONE HCL 5 MG PO TABS
5.0000 mg | ORAL_TABLET | Freq: Once | ORAL | Status: AC
  Administered 2021-11-09: 5 mg via ORAL
  Filled 2021-11-09: qty 1

## 2021-11-09 MED ORDER — DIPHENHYDRAMINE HCL 25 MG PO CAPS
25.0000 mg | ORAL_CAPSULE | Freq: Once | ORAL | Status: AC
  Administered 2021-11-09: 25 mg via ORAL
  Filled 2021-11-09: qty 1

## 2021-11-09 MED ORDER — ALUM & MAG HYDROXIDE-SIMETH 200-200-20 MG/5ML PO SUSP
30.0000 mL | Freq: Once | ORAL | Status: AC
  Administered 2021-11-09: 30 mL via ORAL
  Filled 2021-11-09: qty 30

## 2021-11-09 MED ORDER — HYDROXYZINE HCL 25 MG PO TABS: 25.0000 mg | ORAL_TABLET | Freq: Three times a day (TID) | ORAL | 0 refills | Status: DC | PRN

## 2021-11-09 MED ORDER — IPRATROPIUM-ALBUTEROL 0.5-2.5 (3) MG/3ML IN SOLN
3.0000 mL | Freq: Once | RESPIRATORY_TRACT | Status: AC
  Administered 2021-11-09: 3 mL via RESPIRATORY_TRACT
  Filled 2021-11-09: qty 3

## 2021-11-09 MED ORDER — LIDOCAINE VISCOUS HCL 2 % MT SOLN
15.0000 mL | Freq: Once | OROMUCOSAL | Status: AC
  Administered 2021-11-09: 15 mL via ORAL
  Filled 2021-11-09: qty 15

## 2021-11-09 MED ORDER — IOHEXOL 350 MG/ML SOLN
75.0000 mL | Freq: Once | INTRAVENOUS | Status: AC | PRN
  Administered 2021-11-09: 75 mL via INTRAVENOUS

## 2021-11-09 NOTE — Discharge Instructions (Signed)
Please seek medical attention for any high fevers, chest pain, shortness of breath, change in behavior, persistent vomiting, bloody stool or any other new or concerning symptoms.  

## 2021-11-09 NOTE — ED Notes (Signed)
Assisted pt to toilet using a walker which is her normal way of ambulation at home while assisting.

## 2021-11-09 NOTE — ED Notes (Signed)
Pt current having foot pain and lots of itchiness in her hands. Messaged MD and waiting for further instructions.

## 2021-11-09 NOTE — ED Notes (Signed)
Assumed care, pt in nad breathing e/u on room air. BUE hands are swollen. Pt with no needs.

## 2021-11-09 NOTE — ED Notes (Signed)
Pt reports chest pain to MD meds given per [facility].

## 2021-11-09 NOTE — ED Notes (Signed)
At bedside, pt given meds to decrease pain and itching. Pt tolerated taking meds.

## 2021-11-09 NOTE — ED Triage Notes (Signed)
Pt to ED from home via EMS c/o bilateral hand swelling x2 days.  Patient states nausea without vomiting today.  Generalized itching and rash noted to skin.

## 2021-11-09 NOTE — ED Provider Notes (Signed)
[facility] Provider Note    Event Date/Time   First MD Initiated Contact with Patient 11/09/21 [phone]     (approximate)   History   Edema, Rash, and Pruritis   HPI  Vanessa Baker is a 46 y.o. female  who presents to the emergency department today because of concern for swollen and painful hands. Symptoms started roughly 24 hours ago. She denies any unusual activity or trauma prior to the symptoms starting. She has not had similar symptoms in the past. In addition to the hand swelling she has also had some diffuse body itching. The patient had recent right leg surgery for broken tib/fib.   Physical Exam   Triage Vital Signs: ED Triage Vitals  Enc Vitals Group     BP 11/09/21 0502 (!) 129/93     Pulse Rate 11/09/21 0502 100     Resp 11/09/21 0502 18     Temp 11/09/21 0502 98.8 F (37.1 C)     Temp Source 11/09/21 0502 Oral     SpO2 11/09/21 0502 98 %     Weight 11/09/21 0458 180 lb (81.6 kg)     Height 11/09/21 0458 5\' 2"  (1.575 m)     Head Circumference --      Peak Flow --      Pain Score 11/09/21 0457 9     Pain Loc --      Pain Edu? --      Excl. in [place]? --     Most recent vital signs: Vitals:   11/09/21 0502  BP: (!) 129/93  Pulse: 100  Resp: 18  Temp: 98.8 F (37.1 C)  SpO2: 98%    General: Awake, alert, oriented. CV:  Good peripheral perfusion. Regular rate and rhythm. Resp:  Normal effort.  Abd:  No distention.  Other:  Bilateral hand swelling. No discoloration.    ED Results / Procedures / Treatments   Labs (all labs ordered are listed, but only abnormal results are displayed) Labs Reviewed  BASIC METABOLIC PANEL - Abnormal; Notable for the following components:      Result Value   Glucose, Bld 214 (*)    All other components within normal limits  CBC - Abnormal; Notable for the following components:   WBC 13.5 (*)    Hemoglobin 15.1 (*)    Platelets 459 (*)    All other components within normal limits   URINALYSIS, ROUTINE W REFLEX MICROSCOPIC - Abnormal; Notable for the following components:   Color, Urine AMBER (*)    APPearance HAZY (*)    Hgb urine dipstick SMALL (*)    Protein, ur 30 (*)    Leukocytes,Ua SMALL (*)    Bacteria, UA RARE (*)    All other components within normal limits  HEPATIC FUNCTION PANEL  BRAIN NATRIURETIC PEPTIDE  TROPONIN I (HIGH SENSITIVITY)     RADIOLOGY I independently interpreted and visualized the CT angio. My interpretation: No large PE Radiology interpretation:  IMPRESSION:  1. No evidence of pulmonary embolus.  2. Diffuse esophageal wall thickening which could reflect underlying  esophagitis.  3. Hepatic steatosis.    PROCEDURES:  Critical Care performed: No  Procedures   MEDICATIONS ORDERED IN ED: Medications - No data to display   IMPRESSION / MDM / [place] / ED COURSE  I reviewed the triage vital signs and the nursing notes.  Differential diagnosis includes, but is not limited to, liver failure, CHF, kidney failure.   Patient's presentation is most consistent with acute presentation with potential threat to life or bodily function.  Patient presented to the emergency department today with primary concern of hand swelling and itchiness. Patient did have surgery recently on her tib/fib. In terms of the swelling work up in the ED without concerning etiology. No evidence of kidney or liver failure, BNP wnl. Do wonder if it could be related to arthritis. Did discuss follow up with her pcp. However while here she also complained of some chest pain and shortness of breath. Troponin was negative. Did obtain CT angio given recent surgery. Negative for PE. At this time work up is reassuring. Do feel it is reasonable for patient to be discharged to follow up with PCP. Discussed return precautions.    FINAL CLINICAL IMPRESSION(S) / ED DIAGNOSES   Final diagnoses:  Pruritus  Swelling of both hands      Note:  This document was prepared using Dragon voice recognition software and may include unintentional dictation errors.    Nance Pear, MD 11/09/21 [phone]

## 2021-11-10 ENCOUNTER — Ambulatory Visit: Payer: Self-pay | Admitting: *Deleted

## 2021-11-10 ENCOUNTER — Other Ambulatory Visit: Payer: Self-pay | Admitting: Physician Assistant

## 2021-11-10 ENCOUNTER — Ambulatory Visit: Payer: Medicare Other | Admitting: Physician Assistant

## 2021-11-10 NOTE — Telephone Encounter (Signed)
Reason for Disposition  SEVERE itching (i.e., interferes with sleep, normal activities or school)  Answer Assessment - Initial Assessment Questions 1. APPEARANCE of RASH: "Describe the rash." (e.g., spots, blisters, raised areas, skin peeling, scaly)     I went to hospital  yesterday morning.   Just got home.   She is itching all over.   When she scratches hives come up and then 5 minutes later the hives go away. Her PCP put her back on metformin pills.   She started those today.   My hands are so swelling and itching.   I'm having trouble swallow and getting food down.   No short of breath.   I have chest pain.  is on line too.   She is on muscle relaxer.   She is on muscle relaxer and gabapentin.    Muscle relaxer is new.   Took one this morning.    Muscle is Robaxin.  Metformin is new this morning too.     Hospital didn't know what was causing this.    Went to hospital for chest pain.   They did CT scan and heart work up and everything was fine.  No shortness of breath now.   Having trouble swallowing and eating.     2. SIZE: "How big are the spots?" (e.g., tip of pen, eraser, coin; inches, centimeters)     Not asked 3. LOCATION: "Where is the rash located?"     Itching all over and hands are very swollen.   She is doing better since starting the pills.   She is still having trouble drinking and eating.   Having to take little bites due to swallowing difficulty.   She has a cast on her leg.   Broken in 2 places.   Surgery on Sept 21st.    Her hands are swollen and the itching has been happening since surgery.   4. COLOR: "What color is the rash?" (Note: It is difficult to assess rash color in people with darker-colored skin. When this situation occurs, simply ask the caller to describe what they see.)     No hives now.   When she scratches she breaks out and then 5 minutes later the hives go away.   5. ONSET: "When did the rash begin?"     This started 4 days after her leg surgery the  scratching and itching.  6. FEVER: "Do you have a fever?" If Yes, ask: "What is your temperature, how was it measured, and when did it start?"     No 7. ITCHING: "Does the rash itch?" If Yes, ask: "How bad is the itch?" (Scale 1-10; or mild, moderate, severe)     Yes  All over 8. CAUSE: "What do you think is causing the rash?"     I have no idea.   Went to hospital yesterday for it.   They don't know what is causing the swollen hands and itching. 9. MEDICINE FACTORS: "Have you started any new medicines within the last 2 weeks?" (e.g., antibiotics)      Yes Robaxin is new and oxycodone but been off the oxycodone a week now.    10. OTHER SYMPTOMS: "Do you have any other symptoms?" (e.g., dizziness, headache, sore throat, joint pain)       Both hands swollen 11. PREGNANCY: "Is there any chance you are pregnant?" "When was your last menstrual period?"       N/A  Protocols used: Rash or Redness - [facility]

## 2021-11-10 NOTE — Telephone Encounter (Signed)
  Chief Complaint: Both hands swollen, hives that come up with scratching then resolve 5 min later, itching all over, difficulty swallowing, chest pain  (Has a cast on her leg broken in 2 places from 10/26/2021)   Went to ED 11/09/2021 everything fine. Symptoms: see above Frequency: At least since surgery on leg 10/26/2021 Pertinent Negatives: Patient denies shortness of breath, wheezing.    C/O several issues. Disposition: [] ED /[] Urgent Care (no appt availability in office) / [x] Appointment(In office/virtual)/ []  [place] [facility]/ [] Home Care/ [] Refused Recommended Disposition /[] [place] [facility]/ []  Follow-up with PCP Additional Notes: Appt. Made for today with Talitha Givens, PA-C at 3:40 with instruction to return to the ED if she became short of breath, could not swallow, chest pain becomes worse.   She and husband verbalized understanding.

## 2021-11-10 NOTE — Progress Notes (Deleted)
Acute Office Visit   Patient: Vanessa Baker   DOB: 1975-08-04   46 y.o. Female  MRN: [id number] Visit Date: 11/10/2021  Today's healthcare provider: Dani Gobble Mert Dietrick, PA-C  Introduced myself to the patient as a Journalist, newspaper and provided education on APPs in clinical practice.    No chief complaint on file.  Subjective    HPI    Medications: Outpatient Medications Prior to Visit  Medication Sig   albuterol (VENTOLIN HFA) 108 (90 Base) MCG/ACT inhaler INHALE 2 PUFFS BY MOUTH EVERY 6 HOURS AS NEEDED FOR WHEEZING AND FOR SHORTNESS OF BREATH   aspirin EC 325 MG tablet Take 1 tablet (325 mg total) by mouth daily with breakfast for 28 days.   atorvastatin (LIPITOR) 40 MG tablet Take 1 tablet (40 mg total) by mouth daily.   baclofen (LIORESAL) 10 MG tablet Take 1 tablet (10 mg total) by mouth 3 (three) times daily as needed. for muscle spams   benztropine (COGENTIN) 1 MG tablet Take 1 mg by mouth at bedtime.   blood glucose meter kit and supplies Dispense based on patient and insurance preference. Use up to four times daily as directed. (FOR ICD-10 E10.9, E11.9).   Blood Glucose Monitoring Suppl (ACCU-CHEK AVIVA PLUS) w/Device KIT Use to check blood sugar up to 3 times daily   carbamazepine (TEGRETOL) 200 MG tablet Take 1 tablet daily by mouth AM and 1.5 tablets by mouth daily PM.   docusate sodium (COLACE) 100 MG capsule Take 1 capsule (100 mg total) by mouth 2 (two) times daily as needed for mild constipation.   fluticasone furoate-vilanterol (BREO ELLIPTA) 100-25 MCG/ACT AEPB Inhale 1 puff into the lungs daily.   gabapentin (NEURONTIN) 600 MG tablet Take 600 mg by mouth See admin instructions. Take 600 MG (one tablet) by mouth at 6 pm and 1200 MG (two tablets) by mouth at bedtime.   hydrOXYzine (ATARAX) 25 MG tablet Take 1 tablet (25 mg total) by mouth 3 (three) times daily as needed for itching.   lisinopril (ZESTRIL) 5 MG tablet Take 5 mg by mouth daily. (Patient not taking:  Reported on 11/06/2021)   metFORMIN (GLUCOPHAGE) 500 MG tablet Take 1 tablet (500 mg total) by mouth 2 (two) times daily with a meal.   methocarbamol (ROBAXIN-750) 750 MG tablet Take 1 tablet (750 mg total) by mouth 3 (three) times daily for 21 days.   Methylcobalamin (B-12) 1000 MCG TBDP Take 1,000 mcg by mouth daily.   OLANZapine (ZYPREXA) 20 MG tablet Take 1 tablet (20 mg total) by mouth at bedtime.   omega-3 acid ethyl esters (LOVAZA) 1 g capsule Take 1 capsule (1 g total) by mouth 2 (two) times daily.   omeprazole (PRILOSEC) 20 MG capsule Take 1 capsule by mouth once daily   pregabalin (LYRICA) 100 MG capsule Take 100 mg by mouth 2 (two) times daily.   primidone (MYSOLINE) 50 MG tablet TAKE 1 TABLET BY MOUTH AT BEDTIME   rOPINIRole (REQUIP) 2 MG tablet Take 1 tablet (2 mg total) by mouth at bedtime.   sertraline (ZOLOFT) 50 MG tablet Take 1 tablet (50 mg total) by mouth daily.   traZODone (DESYREL) 100 MG tablet Take 2 tablets (200 mg total) by mouth at bedtime.   No facility-administered medications prior to visit.    Review of Systems  {Labs  Heme  Chem  Endocrine  Serology  Results Review (optional):23779}   Objective    LMP 11/26/2018 Comment: pt  denies pregnancy-states no period in two years {Show previous vital signs (optional):23777}  Physical Exam    No results found for any visits on 11/10/21.  Assessment & Plan      No follow-ups on file.

## 2021-11-10 NOTE — Telephone Encounter (Signed)
Spoke with Berton Lan and provided verbal OK for patient. [name] verbalized understanding and has no further questions at this time.

## 2021-11-13 ENCOUNTER — Telehealth: Payer: Self-pay | Admitting: *Deleted

## 2021-11-13 DIAGNOSIS — I152 Hypertension secondary to endocrine disorders: Secondary | ICD-10-CM | POA: Diagnosis not present

## 2021-11-13 DIAGNOSIS — E1169 Type 2 diabetes mellitus with other specified complication: Secondary | ICD-10-CM | POA: Diagnosis not present

## 2021-11-13 DIAGNOSIS — E785 Hyperlipidemia, unspecified: Secondary | ICD-10-CM | POA: Diagnosis not present

## 2021-11-13 DIAGNOSIS — J449 Chronic obstructive pulmonary disease, unspecified: Secondary | ICD-10-CM | POA: Diagnosis not present

## 2021-11-13 DIAGNOSIS — E1159 Type 2 diabetes mellitus with other circulatory complications: Secondary | ICD-10-CM | POA: Diagnosis not present

## 2021-11-13 DIAGNOSIS — Z4889 Encounter for other specified surgical aftercare: Secondary | ICD-10-CM | POA: Diagnosis not present

## 2021-11-13 DIAGNOSIS — K219 Gastro-esophageal reflux disease without esophagitis: Secondary | ICD-10-CM | POA: Diagnosis not present

## 2021-11-13 DIAGNOSIS — G2581 Restless legs syndrome: Secondary | ICD-10-CM | POA: Diagnosis not present

## 2021-11-13 DIAGNOSIS — S82831D Other fracture of upper and lower end of right fibula, subsequent encounter for closed fracture with routine healing: Secondary | ICD-10-CM | POA: Diagnosis not present

## 2021-11-13 DIAGNOSIS — F17210 Nicotine dependence, cigarettes, uncomplicated: Secondary | ICD-10-CM | POA: Diagnosis not present

## 2021-11-13 DIAGNOSIS — S82891A Other fracture of right lower leg, initial encounter for closed fracture: Secondary | ICD-10-CM | POA: Diagnosis not present

## 2021-11-13 DIAGNOSIS — J4540 Moderate persistent asthma, uncomplicated: Secondary | ICD-10-CM | POA: Diagnosis not present

## 2021-11-13 DIAGNOSIS — E1140 Type 2 diabetes mellitus with diabetic neuropathy, unspecified: Secondary | ICD-10-CM | POA: Diagnosis not present

## 2021-11-13 DIAGNOSIS — Z9181 History of falling: Secondary | ICD-10-CM | POA: Diagnosis not present

## 2021-11-13 DIAGNOSIS — S82301D Unspecified fracture of lower end of right tibia, subsequent encounter for closed fracture with routine healing: Secondary | ICD-10-CM | POA: Diagnosis not present

## 2021-11-13 DIAGNOSIS — Z7982 Long term (current) use of aspirin: Secondary | ICD-10-CM | POA: Diagnosis not present

## 2021-11-13 DIAGNOSIS — D72829 Elevated white blood cell count, unspecified: Secondary | ICD-10-CM | POA: Diagnosis not present

## 2021-11-13 DIAGNOSIS — G43909 Migraine, unspecified, not intractable, without status migrainosus: Secondary | ICD-10-CM | POA: Diagnosis not present

## 2021-11-13 NOTE — Telephone Encounter (Signed)
Transition Care Management Follow-up Telephone Call Date of discharge and from where: [facility] 10-10-2021 How have you been since you were released from the hospital? Swelling has gone down but still having itching Any questions or concerns? No  Items Reviewed: Did the pt receive and understand the discharge instructions provided? Yes  Medications obtained and verified? Yes  Other?  Any new allergies since your discharge? No  Dietary orders reviewed? No Do you have support at home? Yes   Home Care and Equipment/Supplies: Were home health services ordered?  If so, what is the name of the agency?   Has the agency set up a time to come to the patient's home?  Were any new equipment or medical supplies ordered?   What is the name of the medical supply agency?  Were you able to get the supplies/equipment?  Do you have any questions related to the use of the equipment or supplies?   Functional Questionnaire: (I = Independent and D = Dependent) ADLs: I  Bathing/Dressing- I  Meal Prep- I  Eating- I  Maintaining continence- I  Transferring/Ambulation- I  Managing Meds- I  Follow up appointments reviewed:  PCP Hospital f/u appt confirmed? Yes  Scheduled to see 11-16-2021  [facility] f/u appt confirmed?  no. Are transportation arrangements needed? No  If their condition worsens, is the pt aware to call PCP or go to the Emergency Dept.? Yes Was the patient provided with contact information for the PCP's office or ED? Yes Was to pt encouraged to call back with questions or concerns? Yes

## 2021-11-15 ENCOUNTER — Encounter: Payer: Self-pay | Admitting: Nurse Practitioner

## 2021-11-16 ENCOUNTER — Ambulatory Visit: Payer: Medicare Other | Admitting: Nurse Practitioner

## 2021-11-19 NOTE — Patient Instructions (Incomplete)
SARNA LOTION  Pruritus Pruritus is an itchy feeling on the skin. One of the most common causes is dry skin, but many different things can cause itching. Most cases of itching do not require medical attention. Sometimes itchy skin can turn into a rash or a secondary infection. Follow these instructions at home: Skin care  Do not use scented soaps, detergents, perfumes, and cosmetic products. Instead, use gentle, unscented versions of these items. Apply moisturizing creams to your skin frequently, at least twice daily. Apply immediately after bathing while skin is still wet. Take medicines or apply medicated creams only as told by your health care provider. This may include: Corticosteroid cream or topical calcineurin inhibitor. Anti-itch lotions containing urea, camphor, or menthol. Oral antihistamines. Do not take hot showers or baths, which can make itching worse. A short, cool shower may help with itching as long as you apply moisturizing lotion after the shower. Apply a cool, wet cloth (cool compress) to the affected areas. You may take lukewarm baths with one of the following: Epsom salts. You can get these at your local pharmacy or grocery store. Follow the instructions on the packaging. Baking soda. Pour a small amount into the bath as told by your health care provider. Colloidal oatmeal. You can get this at your local pharmacy or grocery store. Follow the instructions on the packaging. Do not scratch your skin. General instructions Avoid wearing tight clothes. Keep a journal to help find out what is causing your itching. Write down: What you eat and drink. What cosmetic products you use. What soaps or detergents you use. What you wear, including jewelry. Use a humidifier. This keeps the air moist, which helps to prevent dry skin. Be aware of any changes in your itchiness. Tell your health care provider about any changes. Contact a health care provider if: The itching does not go  away after several days. You notice redness, warmth, or drainage on the skin where you have scratched. You are unusually thirsty or urinating more than normal. Your skin tingles or feels numb. Your skin or the white parts of your eyes turn yellow (jaundice). You feel weak. You have any of the following: Night sweats. Tiredness (fatigue). Weight loss. Abdominal pain. Summary Pruritus is an itchy feeling on the skin. One of the most common causes is dry skin, but many different conditions and factors can cause itching. Apply moisturizing creams to your skin frequently, at least twice daily. Apply immediately after bathing while skin is still wet. Take medicines or apply medicated creams only as told by your health care provider. Do not take hot showers or baths. Do not use scented soaps, detergents, perfumes, or cosmetic products. Keep a journal to help find out what is causing your itching. This information is not intended to replace advice given to you by your health care provider. Make sure you discuss any questions you have with your health care provider. Document Revised: 03/01/2021 Document Reviewed: 03/01/2021 Elsevier Patient Education  2023 [employer].

## 2021-11-20 MED ORDER — ACCU-CHEK GUIDE VI STRP: ORAL_STRIP | 12 refills | Status: DC

## 2021-11-20 MED ORDER — ACCU-CHEK SOFTCLIX LANCETS MISC: 12 refills | Status: DC

## 2021-11-21 ENCOUNTER — Ambulatory Visit (INDEPENDENT_AMBULATORY_CARE_PROVIDER_SITE_OTHER): Payer: Medicare Other | Admitting: Nurse Practitioner

## 2021-11-21 ENCOUNTER — Encounter: Payer: Self-pay | Admitting: Nurse Practitioner

## 2021-11-21 ENCOUNTER — Other Ambulatory Visit: Payer: Self-pay | Admitting: Nurse Practitioner

## 2021-11-21 VITALS — BP 142/82 | HR 83 | Temp 98.2°F | Wt 180.0 lb

## 2021-11-21 DIAGNOSIS — L299 Pruritus, unspecified: Secondary | ICD-10-CM

## 2021-11-21 DIAGNOSIS — S82401D Unspecified fracture of shaft of right fibula, subsequent encounter for closed fracture with routine healing: Secondary | ICD-10-CM

## 2021-11-21 DIAGNOSIS — Z23 Encounter for immunization: Secondary | ICD-10-CM

## 2021-11-21 DIAGNOSIS — K219 Gastro-esophageal reflux disease without esophagitis: Secondary | ICD-10-CM

## 2021-11-21 DIAGNOSIS — I152 Hypertension secondary to endocrine disorders: Secondary | ICD-10-CM

## 2021-11-21 DIAGNOSIS — S82201D Unspecified fracture of shaft of right tibia, subsequent encounter for closed fracture with routine healing: Secondary | ICD-10-CM | POA: Diagnosis not present

## 2021-11-21 DIAGNOSIS — F3162 Bipolar disorder, current episode mixed, moderate: Secondary | ICD-10-CM

## 2021-11-21 DIAGNOSIS — E1149 Type 2 diabetes mellitus with other diabetic neurological complication: Secondary | ICD-10-CM

## 2021-11-21 DIAGNOSIS — E1169 Type 2 diabetes mellitus with other specified complication: Secondary | ICD-10-CM

## 2021-11-21 DIAGNOSIS — F4310 Post-traumatic stress disorder, unspecified: Secondary | ICD-10-CM

## 2021-11-21 DIAGNOSIS — E538 Deficiency of other specified B group vitamins: Secondary | ICD-10-CM

## 2021-11-21 MED ORDER — EPINEPHRINE 0.3 MG/0.3ML IJ SOAJ: 0.3000 mg | INTRAMUSCULAR | 0 refills | Status: AC | PRN

## 2021-11-21 MED ORDER — TRIAMCINOLONE ACETONIDE 0.1 % EX CREA: 1.0000 | TOPICAL_CREAM | Freq: Two times a day (BID) | CUTANEOUS | 3 refills | Status: DC

## 2021-11-21 MED ORDER — HYDROXYZINE HCL 25 MG PO TABS: 25.0000 mg | ORAL_TABLET | Freq: Three times a day (TID) | ORAL | 4 refills | Status: DC | PRN

## 2021-11-21 MED ORDER — PREDNISONE 10 MG PO TABS: ORAL_TABLET | ORAL | 0 refills | Status: DC

## 2021-11-21 MED ORDER — OMEPRAZOLE 20 MG PO CPDR: 20.0000 mg | DELAYED_RELEASE_CAPSULE | Freq: Two times a day (BID) | ORAL | 1 refills | Status: DC

## 2021-11-21 NOTE — Progress Notes (Signed)
BP (!) 142/82   Pulse 83   Temp 98.2 F (36.8 C) (Oral)   Wt 180 lb (81.6 kg)   LMP 11/26/2018 Comment: pt denies pregnancy-states no period in two years  SpO2 95%   BMI 32.92 kg/m    Subjective:    Patient ID: Vanessa Baker, female    DOB: August 03, 1975, 46 y.o.   MRN: [id number]  HPI: Vanessa Baker is a 46 y.o. female  Chief Complaint  Patient presents with   Pruritis    Patient is here for hospital follow up on Pruritis from 11/09/21. Patient says she is still having itchiness and redness and says she is breaking out in hives all over her body. Patient says she has tried Benadryl and other medications over the counter. Patient says she is itching all day.    RASH Started with itching 2 weeks ago, this started after she broke her leg 10/26/21 -- after she came home she started to have pruritus with rash.  Taking Benadryl, extra strength Benadryl, Calamine, and Cortisone. Last A1c 7% in September is back on Metformin.  Has not taken pain medication in 2-3 days.   Has noticed increase in heart burn with this and difficulty with swallowing at times.  Taking Omeprazole.  Duration:  weeks  Location: generalized  Itching: yes Burning: no Redness: yes Oozing: no Scaling: no Blisters: no Painful: no Fevers: no Change in detergents/soaps/personal care products: recent anesthesia Recent illness: no Recent travel:no History of same: no Context: fluctuating Alleviating factors: nothing Treatments attempted:hydrocortisone cream, benadryl, and lotion/moisturizer Shortness of breath: no  Throat/tongue swelling: no Myalgias/arthralgias: no   Relevant past medical, surgical, family and social history reviewed and updated as indicated. Interim medical history since our last visit reviewed. Allergies and medications reviewed and updated.  Review of Systems  Constitutional:  Negative for activity change, appetite change, diaphoresis, fatigue and fever.  Respiratory:   Negative for cough, chest tightness and shortness of breath.   Cardiovascular:  Negative for chest pain, palpitations and leg swelling.  Gastrointestinal: Negative.   Endocrine: Negative for cold intolerance, heat intolerance, polydipsia, polyphagia and polyuria.  Skin:  Positive for rash.  Neurological: Negative.   Psychiatric/Behavioral: Negative.      Per HPI unless specifically indicated above     Objective:    BP (!) 142/82   Pulse 83   Temp 98.2 F (36.8 C) (Oral)   Wt 180 lb (81.6 kg)   LMP 11/26/2018 Comment: pt denies pregnancy-states no period in two years  SpO2 95%   BMI 32.92 kg/m   Wt Readings from Last 3 Encounters:  11/21/21 180 lb (81.6 kg)  11/09/21 180 lb (81.6 kg)  10/26/21 180 lb (81.6 kg)    Physical Exam Vitals and nursing note reviewed.  Constitutional:      General: She is awake. She is not in acute distress.    Appearance: She is well-developed and well-groomed. She is obese. She is not ill-appearing or toxic-appearing.  HENT:     Head: Normocephalic.     Right Ear: Hearing normal.     Left Ear: Hearing normal.     Mouth/Throat:     Tongue: No lesions.     Pharynx: Oropharynx is clear.  Eyes:     General: Lids are normal.        Right eye: No discharge.        Left eye: No discharge.     Conjunctiva/sclera: Conjunctivae normal.     Pupils:  Pupils are equal, round, and reactive to light.  Neck:     Thyroid: No thyromegaly.     Vascular: No carotid bruit.  Cardiovascular:     Rate and Rhythm: Normal rate and regular rhythm.     Heart sounds: Normal heart sounds. No murmur heard.    No gallop.  Pulmonary:     Effort: Pulmonary effort is normal. No accessory muscle usage or respiratory distress.     Breath sounds: Normal breath sounds.  Abdominal:     General: Bowel sounds are normal.     Palpations: Abdomen is soft.  Musculoskeletal:     Cervical back: Normal range of motion and neck supple.     Right lower leg: No edema.     Left  lower leg: No edema.  Skin:    General: Skin is warm and dry.     Findings: Rash present. Rash is urticarial.     Comments: To bilateral arms, multiple scattered hives (raised and red) with linear scratch marks.  Neurological:     Mental Status: She is alert and oriented to person, place, and time.     Deep Tendon Reflexes: Reflexes are normal and symmetric.     Reflex Scores:      Brachioradialis reflexes are 2+ on the right side and 2+ on the left side.      Patellar reflexes are 2+ on the right side and 2+ on the left side. Psychiatric:        Attention and Perception: Attention normal.        Mood and Affect: Mood normal.        Speech: Speech normal.        Behavior: Behavior normal. Behavior is cooperative.        Thought Content: Thought content normal.     Results for orders placed or performed during the hospital encounter of 18/29/93  Basic metabolic panel  Result Value Ref Range   Sodium 135 135 - 145 mmol/L   Potassium 4.0 3.5 - 5.1 mmol/L   Chloride 101 98 - 111 mmol/L   CO2 25 22 - 32 mmol/L   Glucose, Bld 214 (H) 70 - 99 mg/dL   BUN 11 6 - 20 mg/dL   Creatinine, Ser 0.63 0.44 - 1.00 mg/dL   Calcium 9.1 8.9 - 10.3 mg/dL   GFR, Estimated >60 >60 mL/min   Anion gap 9 5 - 15  CBC  Result Value Ref Range   WBC 13.5 (H) 4.0 - 10.5 K/uL   RBC 5.03 3.87 - 5.11 MIL/uL   Hemoglobin 15.1 (H) 12.0 - 15.0 g/dL   HCT 45.7 36.0 - 46.0 %   MCV 90.9 80.0 - 100.0 fL   MCH 30.0 26.0 - 34.0 pg   MCHC 33.0 30.0 - 36.0 g/dL   RDW 12.5 11.5 - 15.5 %   Platelets 459 (H) 150 - 400 K/uL   nRBC 0.0 0.0 - 0.2 %  Urinalysis, Routine w reflex microscopic  Result Value Ref Range   Color, Urine AMBER (A) YELLOW   APPearance HAZY (A) CLEAR   Specific Gravity, Urine 1.023 1.005 - 1.030   pH 5.0 5.0 - 8.0   Glucose, UA NEGATIVE NEGATIVE mg/dL   Hgb urine dipstick SMALL (A) NEGATIVE   Bilirubin Urine NEGATIVE NEGATIVE   Ketones, ur NEGATIVE NEGATIVE mg/dL   Protein, ur 30 (A)  NEGATIVE mg/dL   Nitrite NEGATIVE NEGATIVE   Leukocytes,Ua SMALL (A) NEGATIVE   RBC / HPF 6-10  0 - 5 RBC/hpf   WBC, UA 6-10 0 - 5 WBC/hpf   Bacteria, UA RARE (A) NONE SEEN   Squamous Epithelial / LPF 6-10 0 - 5   Mucus PRESENT   Hepatic function panel  Result Value Ref Range   Total Protein 6.9 6.5 - 8.1 g/dL   Albumin 3.5 3.5 - 5.0 g/dL   AST 28 15 - 41 U/L   ALT 20 0 - 44 U/L   Alkaline Phosphatase 113 38 - 126 U/L   Total Bilirubin 0.7 0.3 - 1.2 mg/dL   Bilirubin, Direct 0.1 0.0 - 0.2 mg/dL   Indirect Bilirubin 0.6 0.3 - 0.9 mg/dL  Brain natriuretic peptide  Result Value Ref Range   B Natriuretic Peptide 15.8 0.0 - 100.0 pg/mL  Troponin I (High Sensitivity)  Result Value Ref Range   Troponin I (High Sensitivity) 4 <18 ng/L      Assessment & Plan:   Problem List Items Addressed This Visit       Digestive   Gastroesophageal reflux disease    Chronic, ongoing.  Increase Omeprazole to 20 MG BID for short period post hospital and plan to reduce back down in future once improved symptoms.  Risks of PPI use were discussed with patient including bone loss, C. Diff diarrhea, pneumonia, infections, CKD, electrolyte abnormalities.  Pt. Verbalizes understanding and chooses to continue the medication.      Relevant Medications   omeprazole (PRILOSEC) 20 MG capsule     Musculoskeletal and Integument   Fracture tibia/fibula, right, closed, with routine healing, subsequent encounter    On 10/26/21, continue collaboration with ortho.  Routine healing present.      Pruritus - Primary    Acute since discharge from hospital, suspect allergic reaction (?related to her pain medication, which she recently stopped).  Will send in Prednisone taper, discussed with her this may elevated sugars and monitor closely -- alert provider if any >300.  Triamcinolone cream and refills on Vistaril sent in to help with pruritus.  Recommend to obtain Sarna lotion over the counter + start taking Claritin 10  MG daily until symptoms improved.  Do not restart pain medication.  Epi pen sent in case more severe reaction presents.        Follow up plan: Return in about 2 weeks (around 12/05/2021) for Pruritus/Rash.

## 2021-11-21 NOTE — Assessment & Plan Note (Signed)
On 10/26/21, continue collaboration with ortho.  Routine healing present.

## 2021-11-21 NOTE — Telephone Encounter (Signed)
Requested Prescriptions  Pending Prescriptions Disp Refills  . sertraline (ZOLOFT) 50 MG tablet [Pharmacy Med Name: Sertraline HCl 50 MG Oral Tablet] 90 tablet 0    Sig: Take 1 tablet by mouth once daily     Psychiatry:  Antidepressants - SSRI - sertraline Passed - 11/21/2021 12:30 PM      Passed - AST in normal range and within 360 days    AST  Date Value Ref Range Status  11/09/2021 28 15 - 41 U/L Final   SGOT(AST)  Date Value Ref Range Status  06/10/2013 102 (H) 15 - 37 Unit/L Final         Passed - ALT in normal range and within 360 days    ALT  Date Value Ref Range Status  11/09/2021 20 0 - 44 U/L Final   SGPT (ALT)  Date Value Ref Range Status  06/10/2013 59 12 - 78 U/L Final         Passed - Completed PHQ-2 or PHQ-9 in the last 360 days      Passed - Valid encounter within last 6 months    Recent Outpatient Visits          Today Type 2 diabetes mellitus with obesity ([place])   [place], Jolene T, NP   2 weeks ago Type 2 diabetes mellitus with obesity ([place])   [facility] Mec[place], Erin E, PA-C   5 months ago Type 2 diabetes mellitus with obesity ([place])   [place], Jolene T, NP   7 months ago Bipolar 1 disorder, mixed, moderate ([place])   [place], Jolene T, NP   8 months ago Bipolar 1 disorder, mixed, moderate ([place])   [place], Barbaraann Faster, NP      Future Appointments            In 2 months Cannady, Barbaraann Faster, NP [facility]

## 2021-11-21 NOTE — Assessment & Plan Note (Signed)
Chronic, ongoing.  Increase Omeprazole to 20 MG BID for short period post hospital and plan to reduce back down in future once improved symptoms.  Risks of PPI use were discussed with patient including bone loss, C. Diff diarrhea, pneumonia, infections, CKD, electrolyte abnormalities.  Pt. Verbalizes understanding and chooses to continue the medication.

## 2021-11-21 NOTE — Assessment & Plan Note (Signed)
Acute since discharge from hospital, suspect allergic reaction (?related to her pain medication, which she recently stopped).  Will send in Prednisone taper, discussed with her this may elevated sugars and monitor closely -- alert provider if any >300.  Triamcinolone cream and refills on Vistaril sent in to help with pruritus.  Recommend to obtain Sarna lotion over the counter + start taking Claritin 10 MG daily until symptoms improved.  Do not restart pain medication.  Epi pen sent in case more severe reaction presents.

## 2021-11-23 DIAGNOSIS — Z9181 History of falling: Secondary | ICD-10-CM | POA: Diagnosis not present

## 2021-11-23 DIAGNOSIS — D72829 Elevated white blood cell count, unspecified: Secondary | ICD-10-CM | POA: Diagnosis not present

## 2021-11-23 DIAGNOSIS — J4540 Moderate persistent asthma, uncomplicated: Secondary | ICD-10-CM | POA: Diagnosis not present

## 2021-11-23 DIAGNOSIS — J449 Chronic obstructive pulmonary disease, unspecified: Secondary | ICD-10-CM | POA: Diagnosis not present

## 2021-11-23 DIAGNOSIS — I152 Hypertension secondary to endocrine disorders: Secondary | ICD-10-CM | POA: Diagnosis not present

## 2021-11-23 DIAGNOSIS — S82301D Unspecified fracture of lower end of right tibia, subsequent encounter for closed fracture with routine healing: Secondary | ICD-10-CM | POA: Diagnosis not present

## 2021-11-23 DIAGNOSIS — F17210 Nicotine dependence, cigarettes, uncomplicated: Secondary | ICD-10-CM | POA: Diagnosis not present

## 2021-11-23 DIAGNOSIS — E1159 Type 2 diabetes mellitus with other circulatory complications: Secondary | ICD-10-CM | POA: Diagnosis not present

## 2021-11-23 DIAGNOSIS — Z7982 Long term (current) use of aspirin: Secondary | ICD-10-CM | POA: Diagnosis not present

## 2021-11-23 DIAGNOSIS — E1140 Type 2 diabetes mellitus with diabetic neuropathy, unspecified: Secondary | ICD-10-CM | POA: Diagnosis not present

## 2021-11-23 DIAGNOSIS — E1169 Type 2 diabetes mellitus with other specified complication: Secondary | ICD-10-CM | POA: Diagnosis not present

## 2021-11-23 DIAGNOSIS — E785 Hyperlipidemia, unspecified: Secondary | ICD-10-CM | POA: Diagnosis not present

## 2021-11-23 DIAGNOSIS — K219 Gastro-esophageal reflux disease without esophagitis: Secondary | ICD-10-CM | POA: Diagnosis not present

## 2021-11-23 DIAGNOSIS — S82831D Other fracture of upper and lower end of right fibula, subsequent encounter for closed fracture with routine healing: Secondary | ICD-10-CM | POA: Diagnosis not present

## 2021-11-23 DIAGNOSIS — G43909 Migraine, unspecified, not intractable, without status migrainosus: Secondary | ICD-10-CM | POA: Diagnosis not present

## 2021-11-23 DIAGNOSIS — G2581 Restless legs syndrome: Secondary | ICD-10-CM | POA: Diagnosis not present

## 2021-11-30 DIAGNOSIS — Z7982 Long term (current) use of aspirin: Secondary | ICD-10-CM | POA: Diagnosis not present

## 2021-11-30 DIAGNOSIS — J449 Chronic obstructive pulmonary disease, unspecified: Secondary | ICD-10-CM | POA: Diagnosis not present

## 2021-11-30 DIAGNOSIS — E1159 Type 2 diabetes mellitus with other circulatory complications: Secondary | ICD-10-CM | POA: Diagnosis not present

## 2021-11-30 DIAGNOSIS — D72829 Elevated white blood cell count, unspecified: Secondary | ICD-10-CM | POA: Diagnosis not present

## 2021-11-30 DIAGNOSIS — I152 Hypertension secondary to endocrine disorders: Secondary | ICD-10-CM | POA: Diagnosis not present

## 2021-11-30 DIAGNOSIS — G2581 Restless legs syndrome: Secondary | ICD-10-CM | POA: Diagnosis not present

## 2021-11-30 DIAGNOSIS — S82301D Unspecified fracture of lower end of right tibia, subsequent encounter for closed fracture with routine healing: Secondary | ICD-10-CM | POA: Diagnosis not present

## 2021-11-30 DIAGNOSIS — E1169 Type 2 diabetes mellitus with other specified complication: Secondary | ICD-10-CM | POA: Diagnosis not present

## 2021-11-30 DIAGNOSIS — J4540 Moderate persistent asthma, uncomplicated: Secondary | ICD-10-CM | POA: Diagnosis not present

## 2021-11-30 DIAGNOSIS — E1140 Type 2 diabetes mellitus with diabetic neuropathy, unspecified: Secondary | ICD-10-CM | POA: Diagnosis not present

## 2021-11-30 DIAGNOSIS — K219 Gastro-esophageal reflux disease without esophagitis: Secondary | ICD-10-CM | POA: Diagnosis not present

## 2021-11-30 DIAGNOSIS — G43909 Migraine, unspecified, not intractable, without status migrainosus: Secondary | ICD-10-CM | POA: Diagnosis not present

## 2021-11-30 DIAGNOSIS — F17210 Nicotine dependence, cigarettes, uncomplicated: Secondary | ICD-10-CM | POA: Diagnosis not present

## 2021-11-30 DIAGNOSIS — Z9181 History of falling: Secondary | ICD-10-CM | POA: Diagnosis not present

## 2021-11-30 DIAGNOSIS — S82831D Other fracture of upper and lower end of right fibula, subsequent encounter for closed fracture with routine healing: Secondary | ICD-10-CM | POA: Diagnosis not present

## 2021-11-30 DIAGNOSIS — E785 Hyperlipidemia, unspecified: Secondary | ICD-10-CM | POA: Diagnosis not present

## 2021-12-02 ENCOUNTER — Other Ambulatory Visit: Payer: Self-pay | Admitting: Nurse Practitioner

## 2021-12-03 ENCOUNTER — Encounter: Payer: Self-pay | Admitting: Nurse Practitioner

## 2021-12-04 DIAGNOSIS — G43909 Migraine, unspecified, not intractable, without status migrainosus: Secondary | ICD-10-CM | POA: Diagnosis not present

## 2021-12-04 DIAGNOSIS — K219 Gastro-esophageal reflux disease without esophagitis: Secondary | ICD-10-CM | POA: Diagnosis not present

## 2021-12-04 DIAGNOSIS — S82301D Unspecified fracture of lower end of right tibia, subsequent encounter for closed fracture with routine healing: Secondary | ICD-10-CM | POA: Diagnosis not present

## 2021-12-04 DIAGNOSIS — Z9181 History of falling: Secondary | ICD-10-CM | POA: Diagnosis not present

## 2021-12-04 DIAGNOSIS — Z7982 Long term (current) use of aspirin: Secondary | ICD-10-CM | POA: Diagnosis not present

## 2021-12-04 DIAGNOSIS — S82831D Other fracture of upper and lower end of right fibula, subsequent encounter for closed fracture with routine healing: Secondary | ICD-10-CM | POA: Diagnosis not present

## 2021-12-04 DIAGNOSIS — E1140 Type 2 diabetes mellitus with diabetic neuropathy, unspecified: Secondary | ICD-10-CM | POA: Diagnosis not present

## 2021-12-04 DIAGNOSIS — E785 Hyperlipidemia, unspecified: Secondary | ICD-10-CM | POA: Diagnosis not present

## 2021-12-04 DIAGNOSIS — E1169 Type 2 diabetes mellitus with other specified complication: Secondary | ICD-10-CM | POA: Diagnosis not present

## 2021-12-04 DIAGNOSIS — G2581 Restless legs syndrome: Secondary | ICD-10-CM | POA: Diagnosis not present

## 2021-12-04 DIAGNOSIS — I152 Hypertension secondary to endocrine disorders: Secondary | ICD-10-CM | POA: Diagnosis not present

## 2021-12-04 DIAGNOSIS — J449 Chronic obstructive pulmonary disease, unspecified: Secondary | ICD-10-CM | POA: Diagnosis not present

## 2021-12-04 DIAGNOSIS — F17210 Nicotine dependence, cigarettes, uncomplicated: Secondary | ICD-10-CM | POA: Diagnosis not present

## 2021-12-04 DIAGNOSIS — J4540 Moderate persistent asthma, uncomplicated: Secondary | ICD-10-CM | POA: Diagnosis not present

## 2021-12-04 DIAGNOSIS — D72829 Elevated white blood cell count, unspecified: Secondary | ICD-10-CM | POA: Diagnosis not present

## 2021-12-04 DIAGNOSIS — E1159 Type 2 diabetes mellitus with other circulatory complications: Secondary | ICD-10-CM | POA: Diagnosis not present

## 2021-12-05 ENCOUNTER — Ambulatory Visit: Payer: Medicare Other | Admitting: Nurse Practitioner

## 2021-12-10 NOTE — Patient Instructions (Incomplete)
Rash, Adult  A rash is a change in the color of your skin. A rash can also change the way your skin feels. There are many different conditions and factors that can cause a rash. Follow these instructions at home: The goal of treatment is to stop the itching and keep the rash from spreading. Watch for any changes in your symptoms. Let your doctor know about them. Follow these instructions to help with your condition: Medicine Take or apply over-the-counter and prescription medicines only as told by your doctor. These may include medicines: To treat red or swollen skin (corticosteroid creams). To treat itching. To treat an allergy (oral antihistamines). To treat very bad symptoms (oral corticosteroids).  Skin care Put cool cloths (compresses) on the affected areas. Do not scratch or rub your skin. Avoid covering the rash. Make sure that the rash is exposed to air as much as possible. Managing itching and discomfort Avoid hot showers or baths. These can make itching worse. A cold shower may help. Try taking a bath with: Epsom salts. You can get these at your local pharmacy or grocery store. Follow the instructions on the package. Baking soda. Pour a small amount into the bath as told by your doctor. Colloidal oatmeal. You can get this at your local pharmacy or grocery store. Follow the instructions on the package. Try putting baking soda paste onto your skin. Stir water into baking soda until it gets like a paste. Try putting on a lotion that relieves itchiness (calamine lotion). Keep cool and out of the sun. Sweating and being hot can make itching worse. General instructions  Rest as needed. Drink enough fluid to keep your pee (urine) pale yellow. Wear loose-fitting clothing. Avoid scented soaps, detergents, and perfumes. Use gentle soaps, detergents, perfumes, and other cosmetic products. Avoid anything that causes your rash. Keep a journal to help track what causes your rash. Write  down: What you eat. What cosmetic products you use. What you drink. What you wear. This includes jewelry. Keep all follow-up visits as told by your doctor. This is important. Contact a doctor if: You sweat at night. You lose weight. You pee (urinate) more than normal. You pee less than normal, or you notice that your pee is a darker color than normal. You feel weak. You throw up (vomit). Your skin or the whites of your eyes look yellow (jaundice). Your skin: Tingles. Is numb. Your rash: Does not go away after a few days. Gets worse. You are: More thirsty than normal. More tired than normal. You have: New symptoms. Pain in your belly (abdomen). A fever. Watery poop (diarrhea). Get help right away if: You have a fever and your symptoms suddenly get worse. You start to feel mixed up (confused). You have a very bad headache or a stiff neck. You have very bad joint pains or stiffness. You have jerky movements that you cannot control (seizure). Your rash covers all or most of your body. The rash may or may not be painful. You have blisters that: Are on top of the rash. Grow larger. Grow together. Are painful. Are inside your nose or mouth. You have a rash that: Looks like purple pinprick-sized spots all over your body. Has a "bull's eye" or looks like a target. Is red and painful, causes your skin to peel, and is not from being in the sun too long. Summary A rash is a change in the color of your skin. A rash can also change the way your skin  feels. The goal of treatment is to stop the itching and keep the rash from spreading. Take or apply over-the-counter and prescription medicines only as told by your doctor. Contact a doctor if you have new symptoms or symptoms that get worse. Keep all follow-up visits as told by your doctor. This is important. This information is not intended to replace advice given to you by your health care provider. Make sure you discuss any  questions you have with your health care provider. Document Revised: 07/25/2021 Document Reviewed: 11/03/2020 Elsevier Patient Education  [place].

## 2021-12-11 DIAGNOSIS — S82891A Other fracture of right lower leg, initial encounter for closed fracture: Secondary | ICD-10-CM | POA: Diagnosis not present

## 2021-12-11 DIAGNOSIS — Z4889 Encounter for other specified surgical aftercare: Secondary | ICD-10-CM | POA: Diagnosis not present

## 2021-12-12 ENCOUNTER — Other Ambulatory Visit: Payer: Self-pay | Admitting: Nurse Practitioner

## 2021-12-12 NOTE — Telephone Encounter (Signed)
Start Date: 08/23/20 End Date: 07/31/21  Discontinued by: Venita Lick, NP on 07/31/2021 08:19  Reason: Reorder        Requested Prescriptions  Refused Prescriptions Disp Refills   ADVAIR [facility] 115-21 MCG/ACT inhaler [Pharmacy Med Name: Advair HFA 115-21 MCG/ACT Inhalation Aerosol] 12 g 0    Sig: Inhale 2 puffs by mouth twice daily     Pulmonology:  Combination Products Passed - 12/12/2021  1:58 AM      Passed - Valid encounter within last 12 months    Recent Outpatient Visits           3 weeks ago Pruritus   [place], Jolene T, NP   1 month ago Type 2 diabetes mellitus with obesity ([place])   [place], Erin E, PA-C   6 months ago Type 2 diabetes mellitus with obesity ([place])   [place] Cannady, Jolene T, NP   8 months ago Bipolar 1 disorder, mixed, moderate ([place])   [place] Cannady, Jolene T, NP   8 months ago Bipolar 1 disorder, mixed, moderate ([place])   [place], Barbaraann Faster, NP       Future Appointments             Tomorrow Cannady, Barbaraann Faster, NP [place] [place]   In 1 month [place], Barbaraann Faster, NP [facility]

## 2021-12-13 ENCOUNTER — Ambulatory Visit: Payer: Medicare Other | Admitting: Nurse Practitioner

## 2021-12-21 DIAGNOSIS — J4540 Moderate persistent asthma, uncomplicated: Secondary | ICD-10-CM | POA: Diagnosis not present

## 2021-12-21 DIAGNOSIS — G2581 Restless legs syndrome: Secondary | ICD-10-CM | POA: Diagnosis not present

## 2021-12-21 DIAGNOSIS — S82301D Unspecified fracture of lower end of right tibia, subsequent encounter for closed fracture with routine healing: Secondary | ICD-10-CM | POA: Diagnosis not present

## 2021-12-21 DIAGNOSIS — F17210 Nicotine dependence, cigarettes, uncomplicated: Secondary | ICD-10-CM | POA: Diagnosis not present

## 2021-12-21 DIAGNOSIS — Z7982 Long term (current) use of aspirin: Secondary | ICD-10-CM | POA: Diagnosis not present

## 2021-12-21 DIAGNOSIS — E1169 Type 2 diabetes mellitus with other specified complication: Secondary | ICD-10-CM | POA: Diagnosis not present

## 2021-12-21 DIAGNOSIS — J449 Chronic obstructive pulmonary disease, unspecified: Secondary | ICD-10-CM | POA: Diagnosis not present

## 2021-12-21 DIAGNOSIS — E1159 Type 2 diabetes mellitus with other circulatory complications: Secondary | ICD-10-CM | POA: Diagnosis not present

## 2021-12-21 DIAGNOSIS — D72829 Elevated white blood cell count, unspecified: Secondary | ICD-10-CM | POA: Diagnosis not present

## 2021-12-21 DIAGNOSIS — E1140 Type 2 diabetes mellitus with diabetic neuropathy, unspecified: Secondary | ICD-10-CM | POA: Diagnosis not present

## 2021-12-21 DIAGNOSIS — E785 Hyperlipidemia, unspecified: Secondary | ICD-10-CM | POA: Diagnosis not present

## 2021-12-21 DIAGNOSIS — G43909 Migraine, unspecified, not intractable, without status migrainosus: Secondary | ICD-10-CM | POA: Diagnosis not present

## 2021-12-21 DIAGNOSIS — K219 Gastro-esophageal reflux disease without esophagitis: Secondary | ICD-10-CM | POA: Diagnosis not present

## 2021-12-21 DIAGNOSIS — S82831D Other fracture of upper and lower end of right fibula, subsequent encounter for closed fracture with routine healing: Secondary | ICD-10-CM | POA: Diagnosis not present

## 2021-12-21 DIAGNOSIS — I152 Hypertension secondary to endocrine disorders: Secondary | ICD-10-CM | POA: Diagnosis not present

## 2021-12-21 DIAGNOSIS — Z9181 History of falling: Secondary | ICD-10-CM | POA: Diagnosis not present

## 2021-12-27 DIAGNOSIS — E1169 Type 2 diabetes mellitus with other specified complication: Secondary | ICD-10-CM | POA: Diagnosis not present

## 2021-12-27 DIAGNOSIS — S82831D Other fracture of upper and lower end of right fibula, subsequent encounter for closed fracture with routine healing: Secondary | ICD-10-CM | POA: Diagnosis not present

## 2021-12-27 DIAGNOSIS — E1159 Type 2 diabetes mellitus with other circulatory complications: Secondary | ICD-10-CM | POA: Diagnosis not present

## 2021-12-27 DIAGNOSIS — D72829 Elevated white blood cell count, unspecified: Secondary | ICD-10-CM | POA: Diagnosis not present

## 2021-12-27 DIAGNOSIS — E1140 Type 2 diabetes mellitus with diabetic neuropathy, unspecified: Secondary | ICD-10-CM | POA: Diagnosis not present

## 2021-12-27 DIAGNOSIS — E785 Hyperlipidemia, unspecified: Secondary | ICD-10-CM | POA: Diagnosis not present

## 2021-12-27 DIAGNOSIS — Z7982 Long term (current) use of aspirin: Secondary | ICD-10-CM | POA: Diagnosis not present

## 2021-12-27 DIAGNOSIS — G2581 Restless legs syndrome: Secondary | ICD-10-CM | POA: Diagnosis not present

## 2021-12-27 DIAGNOSIS — K219 Gastro-esophageal reflux disease without esophagitis: Secondary | ICD-10-CM | POA: Diagnosis not present

## 2021-12-27 DIAGNOSIS — F17210 Nicotine dependence, cigarettes, uncomplicated: Secondary | ICD-10-CM | POA: Diagnosis not present

## 2021-12-27 DIAGNOSIS — J4540 Moderate persistent asthma, uncomplicated: Secondary | ICD-10-CM | POA: Diagnosis not present

## 2021-12-27 DIAGNOSIS — J449 Chronic obstructive pulmonary disease, unspecified: Secondary | ICD-10-CM | POA: Diagnosis not present

## 2021-12-27 DIAGNOSIS — I152 Hypertension secondary to endocrine disorders: Secondary | ICD-10-CM | POA: Diagnosis not present

## 2021-12-27 DIAGNOSIS — S82301D Unspecified fracture of lower end of right tibia, subsequent encounter for closed fracture with routine healing: Secondary | ICD-10-CM | POA: Diagnosis not present

## 2021-12-27 DIAGNOSIS — Z9181 History of falling: Secondary | ICD-10-CM | POA: Diagnosis not present

## 2021-12-27 DIAGNOSIS — G43909 Migraine, unspecified, not intractable, without status migrainosus: Secondary | ICD-10-CM | POA: Diagnosis not present

## 2022-01-08 ENCOUNTER — Other Ambulatory Visit: Payer: Self-pay

## 2022-01-08 MED ORDER — FLUTICASONE FUROATE-VILANTEROL 100-25 MCG/ACT IN AEPB: 1.0000 | INHALATION_SPRAY | Freq: Every day | RESPIRATORY_TRACT | 4 refills | Status: DC

## 2022-01-08 NOTE — Telephone Encounter (Signed)
Medication refill for Breo 100-25 last ov 11/21/21, upcoming ov 02/06/22 . Please advise

## 2022-02-02 ENCOUNTER — Other Ambulatory Visit: Payer: Self-pay | Admitting: Nurse Practitioner

## 2022-02-03 NOTE — Telephone Encounter (Signed)
Not on current medication list, will refuse request.  Requested Prescriptions  Pending Prescriptions Disp Refills   ADVAIR HFA 115-21 MCG/ACT inhaler [Pharmacy Med Name: Advair HFA 115-21 MCG/ACT Inhalation Aerosol] 12 g 0    Sig: Inhale 2 puffs by mouth twice daily     Pulmonology:  Combination Products Passed - 02/02/2022 11:20 AM      Passed - Valid encounter within last 12 months    Recent Outpatient Visits           2 months ago Pruritus   [place], [place] T, NP   2 months ago Type 2 diabetes mellitus with obesity ([place])   [place], Erin E, PA-C   8 months ago Type 2 diabetes mellitus with obesity ([place])   [place] Cannady, Jolene T, NP   9 months ago Bipolar 1 disorder, mixed, moderate ([place])   [place], Jolene T, NP   10 months ago Bipolar 1 disorder, mixed, moderate ([place])   [place], Barbaraann Faster, NP       Future Appointments             In 3 days Cannady, Barbaraann Faster, NP [facility]

## 2022-02-04 NOTE — Patient Instructions (Incomplete)
Diabetes Mellitus Basics  Diabetes mellitus, or diabetes, is a long-term (chronic) disease. It occurs when the body does not properly use sugar (glucose) that is released from food after you eat. Diabetes mellitus may be caused by one or both of these problems: Your pancreas does not make enough of a hormone called insulin. Your body does not react in a normal way to the insulin that it makes. Insulin lets glucose enter cells in your body. This gives you energy. If you have diabetes, glucose cannot get into cells. This causes high blood glucose (hyperglycemia). How to treat and manage diabetes You may need to take insulin or other diabetes medicines daily to keep your glucose in balance. If you are prescribed insulin, you will learn how to give yourself insulin by injection. You may need to adjust the amount of insulin you take based on the foods that you eat. You will need to check your blood glucose levels using a glucose monitor as told by your health care provider. The readings can help determine if you have low or high blood glucose. Generally, you should have these blood glucose levels: Before meals (preprandial): 80-130 mg/dL (4.4-7.2 mmol/L). After meals (postprandial): below 180 mg/dL (10 mmol/L). Hemoglobin A1c (HbA1c) level: less than 7%. Your health care provider will set treatment goals for you. Keep all follow-up visits. This is important. Follow these instructions at home: Diabetes medicines Take your diabetes medicines every day as told by your health care provider. List your diabetes medicines here: Name of medicine: ______________________________ Amount (dose): _______________ Time (a.m./p.m.): _______________ Notes: ___________________________________ Name of medicine: ______________________________ Amount (dose): _______________ Time (a.m./p.m.): _______________ Notes: ___________________________________ Name of medicine: ______________________________ Amount (dose):  _______________ Time (a.m./p.m.): _______________ Notes: ___________________________________ Insulin If you use insulin, list the types of insulin you use here: Insulin type: ______________________________ Amount (dose): _______________ Time (a.m./p.m.): _______________Notes: ___________________________________ Insulin type: ______________________________ Amount (dose): _______________ Time (a.m./p.m.): _______________ Notes: ___________________________________ Insulin type: ______________________________ Amount (dose): _______________ Time (a.m./p.m.): _______________ Notes: ___________________________________ Insulin type: ______________________________ Amount (dose): _______________ Time (a.m./p.m.): _______________ Notes: ___________________________________ Insulin type: ______________________________ Amount (dose): _______________ Time (a.m./p.m.): _______________ Notes: ___________________________________ Managing blood glucose  Check your blood glucose levels using a glucose monitor as told by your health care provider. Write down the times that you check your glucose levels here: Time: _______________ Notes: ___________________________________ Time: _______________ Notes: ___________________________________ Time: _______________ Notes: ___________________________________ Time: _______________ Notes: ___________________________________ Time: _______________ Notes: ___________________________________ Time: _______________ Notes: ___________________________________  Low blood glucose Low blood glucose (hypoglycemia) is when glucose is at or below 70 mg/dL (3.9 mmol/L). Symptoms may include: Feeling: Hungry. Sweaty and clammy. Irritable or easily upset. Dizzy. Sleepy. Having: A fast heartbeat. A headache. A change in your vision. Numbness around the mouth, lips, or tongue. Having trouble with: Moving (coordination). Sleeping. Treating low blood glucose To treat low blood  glucose, eat or drink something containing sugar right away. If you can think clearly and swallow safely, follow the 15:15 rule: Take 15 grams of a fast-acting carb (carbohydrate), as told by your health care provider. Some fast-acting carbs are: Glucose tablets: take 3-4 tablets. Hard candy: eat 3-5 pieces. Fruit juice: drink 4 oz (120 mL). Regular (not diet) soda: drink 4-6 oz (120-180 mL). Honey or sugar: eat 1 Tbsp (15 mL). Check your blood glucose levels 15 minutes after you take the carb. If your glucose is still at or below 70 mg/dL (3.9 mmol/L), take 15 grams of a carb again. If your glucose does not go above 70 mg/dL (3.9 mmol/L) after   3 tries, get help right away. After your glucose goes back to normal, eat a meal or a snack within 1 hour. Treating very low blood glucose If your glucose is at or below 54 mg/dL (3 mmol/L), you have very low blood glucose (severe hypoglycemia). This is an emergency. Do not wait to see if the symptoms will go away. Get medical help right away. Call your local emergency services (911 in the [place] Do not drive yourself to the hospital. Questions to ask your health care provider Should I talk with a diabetes educator? What equipment will I need to care for myself at home? What diabetes medicines do I need? When should I take them? How often do I need to check my blood glucose levels? What number can I call if I have questions? When is my follow-up visit? Where can I find a support group for people with diabetes? Where to find more information American Diabetes Association: www.diabetes.org Association of Diabetes Care and Education Specialists: www.diabeteseducator.org Contact a health care provider if: Your blood glucose is at or above 240 mg/dL (13.3 mmol/L) for 2 days in a row. You have been sick or have had a fever for 2 days or more, and you are not getting better. You have any of these problems for more than 6 hours: You cannot eat or  drink. You feel nauseous. You vomit. You have diarrhea. Get help right away if: Your blood glucose is lower than 54 mg/dL (3 mmol/L). You get confused. You have trouble thinking clearly. You have trouble breathing. These symptoms may represent a serious problem that is an emergency. Do not wait to see if the symptoms will go away. Get medical help right away. Call your local emergency services (911 in the [place] Do not drive yourself to the hospital. Summary Diabetes mellitus is a chronic disease that occurs when the body does not properly use sugar (glucose) that is released from food after you eat. Take insulin and diabetes medicines as told. Check your blood glucose every day, as often as told. Keep all follow-up visits. This is important. This information is not intended to replace advice given to you by your health care provider. Make sure you discuss any questions you have with your health care provider. Document Revised: 05/26/2019 Document Reviewed: 05/26/2019 Elsevier Patient Education  2023 [employer].  

## 2022-02-06 ENCOUNTER — Ambulatory Visit: Payer: Medicare Other | Admitting: Nurse Practitioner

## 2022-02-06 DIAGNOSIS — F1421 Cocaine dependence, in remission: Secondary | ICD-10-CM

## 2022-02-06 DIAGNOSIS — F3162 Bipolar disorder, current episode mixed, moderate: Secondary | ICD-10-CM

## 2022-02-06 DIAGNOSIS — Z23 Encounter for immunization: Secondary | ICD-10-CM

## 2022-02-06 DIAGNOSIS — F17210 Nicotine dependence, cigarettes, uncomplicated: Secondary | ICD-10-CM

## 2022-02-06 DIAGNOSIS — F4310 Post-traumatic stress disorder, unspecified: Secondary | ICD-10-CM

## 2022-02-06 DIAGNOSIS — E1169 Type 2 diabetes mellitus with other specified complication: Secondary | ICD-10-CM

## 2022-02-06 DIAGNOSIS — F1011 Alcohol abuse, in remission: Secondary | ICD-10-CM

## 2022-02-06 DIAGNOSIS — E1149 Type 2 diabetes mellitus with other diabetic neurological complication: Secondary | ICD-10-CM

## 2022-02-06 DIAGNOSIS — T43505A Adverse effect of unspecified antipsychotics and neuroleptics, initial encounter: Secondary | ICD-10-CM

## 2022-02-06 DIAGNOSIS — E538 Deficiency of other specified B group vitamins: Secondary | ICD-10-CM

## 2022-02-06 DIAGNOSIS — K219 Gastro-esophageal reflux disease without esophagitis: Secondary | ICD-10-CM

## 2022-02-06 DIAGNOSIS — G2581 Restless legs syndrome: Secondary | ICD-10-CM

## 2022-02-06 DIAGNOSIS — E1159 Type 2 diabetes mellitus with other circulatory complications: Secondary | ICD-10-CM

## 2022-02-06 DIAGNOSIS — Z0000 Encounter for general adult medical examination without abnormal findings: Secondary | ICD-10-CM

## 2022-02-06 DIAGNOSIS — F5101 Primary insomnia: Secondary | ICD-10-CM

## 2022-02-10 NOTE — Patient Instructions (Incomplete)
Please call to schedule your mammogram and/or bone density: [facility]  Address: [address], , [place] [address] Phone: [phone]  [facility] [place] [address] [place],  [place]  [address] Phone: [phone]    Diabetes Mellitus Basics  Diabetes mellitus, or diabetes, is a long-term (chronic) disease. It occurs when the body does not properly use sugar (glucose) that is released from food after you eat. Diabetes mellitus may be caused by one or both of these problems: Your pancreas does not make enough of a hormone called insulin. Your body does not react in a normal way to the insulin that it makes. Insulin lets glucose enter cells in your body. This gives you energy. If you have diabetes, glucose cannot get into cells. This causes high blood glucose (hyperglycemia). How to treat and manage diabetes You may need to take insulin or other diabetes medicines daily to keep your glucose in balance. If you are prescribed insulin, you will learn how to give yourself insulin by injection. You may need to adjust the amount of insulin you take based on the foods that you eat. You will need to check your blood glucose levels using a glucose monitor as told by your health care provider. The readings can help determine if you have low or high blood glucose. Generally, you should have these blood glucose levels: Before meals (preprandial): 80-130 mg/dL (4.4-7.2 mmol/L). After meals (postprandial): below 180 mg/dL (10 mmol/L). Hemoglobin A1c (HbA1c) level: less than 7%. Your health care provider will set treatment goals for you. Keep all follow-up visits. This is important. Follow these instructions at home: Diabetes medicines Take your diabetes medicines every day as told by your health care provider. List your diabetes medicines here: Name of medicine: ______________________________ Amount (dose):  _______________ Time (a.m./p.m.): _______________ Notes: ___________________________________ Name of medicine: ______________________________ Amount (dose): _______________ Time (a.m./p.m.): _______________ Notes: ___________________________________ Name of medicine: ______________________________ Amount (dose): _______________ Time (a.m./p.m.): _______________ Notes: ___________________________________ Insulin If you use insulin, list the types of insulin you use here: Insulin type: ______________________________ Amount (dose): _______________ Time (a.m./p.m.): _______________Notes: ___________________________________ Insulin type: ______________________________ Amount (dose): _______________ Time (a.m./p.m.): _______________ Notes: ___________________________________ Insulin type: ______________________________ Amount (dose): _______________ Time (a.m./p.m.): _______________ Notes: ___________________________________ Insulin type: ______________________________ Amount (dose): _______________ Time (a.m./p.m.): _______________ Notes: ___________________________________ Insulin type: ______________________________ Amount (dose): _______________ Time (a.m./p.m.): _______________ Notes: ___________________________________ Managing blood glucose  Check your blood glucose levels using a glucose monitor as told by your health care provider. Write down the times that you check your glucose levels here: Time: _______________ Notes: ___________________________________ Time: _______________ Notes: ___________________________________ Time: _______________ Notes: ___________________________________ Time: _______________ Notes: ___________________________________ Time: _______________ Notes: ___________________________________ Time: _______________ Notes: ___________________________________  Low blood glucose Low blood glucose (hypoglycemia) is when glucose is at or below 70 mg/dL (3.9 mmol/L).  Symptoms may include: Feeling: Hungry. Sweaty and clammy. Irritable or easily upset. Dizzy. Sleepy. Having: A fast heartbeat. A headache. A change in your vision. Numbness around the mouth, lips, or tongue. Having trouble with: Moving (coordination). Sleeping. Treating low blood glucose To treat low blood glucose, eat or drink something containing sugar right away. If you can think clearly and swallow safely, follow the 15:15 rule: Take 15 grams of a fast-acting carb (carbohydrate), as told by your health care provider. Some fast-acting carbs are: Glucose tablets: take 3-4 tablets. Hard candy: eat 3-5 pieces. Fruit juice: drink 4 oz (120 mL). Regular (not diet) soda: drink 4-6 oz (120-180 mL).   Honey or sugar: eat 1 Tbsp (15 mL). Check your blood glucose levels 15 minutes after you take the carb. If your glucose is still at or below 70 mg/dL (3.9 mmol/L), take 15 grams of a carb again. If your glucose does not go above 70 mg/dL (3.9 mmol/L) after 3 tries, get help right away. After your glucose goes back to normal, eat a meal or a snack within 1 hour. Treating very low blood glucose If your glucose is at or below 54 mg/dL (3 mmol/L), you have very low blood glucose (severe hypoglycemia). This is an emergency. Do not wait to see if the symptoms will go away. Get medical help right away. Call your local emergency services (911 in the [place] Do not drive yourself to the hospital. Questions to ask your health care provider Should I talk with a diabetes educator? What equipment will I need to care for myself at home? What diabetes medicines do I need? When should I take them? How often do I need to check my blood glucose levels? What number can I call if I have questions? When is my follow-up visit? Where can I find a support group for people with diabetes? Where to find more information American Diabetes Association: www.diabetes.org Association of Diabetes Care and Education  Specialists: www.diabeteseducator.org Contact a health care provider if: Your blood glucose is at or above 240 mg/dL (13.3 mmol/L) for 2 days in a row. You have been sick or have had a fever for 2 days or more, and you are not getting better. You have any of these problems for more than 6 hours: You cannot eat or drink. You feel nauseous. You vomit. You have diarrhea. Get help right away if: Your blood glucose is lower than 54 mg/dL (3 mmol/L). You get confused. You have trouble thinking clearly. You have trouble breathing. These symptoms may represent a serious problem that is an emergency. Do not wait to see if the symptoms will go away. Get medical help right away. Call your local emergency services (911 in the [place] Do not drive yourself to the hospital. Summary Diabetes mellitus is a chronic disease that occurs when the body does not properly use sugar (glucose) that is released from food after you eat. Take insulin and diabetes medicines as told. Check your blood glucose every day, as often as told. Keep all follow-up visits. This is important. This information is not intended to replace advice given to you by your health care provider. Make sure you discuss any questions you have with your health care provider. Document Revised: 05/26/2019 Document Reviewed: 05/26/2019 Elsevier Patient Education  2023 [employer]  

## 2022-02-12 ENCOUNTER — Encounter: Payer: Self-pay | Admitting: Nurse Practitioner

## 2022-02-12 ENCOUNTER — Ambulatory Visit (INDEPENDENT_AMBULATORY_CARE_PROVIDER_SITE_OTHER): Payer: 59 | Admitting: Nurse Practitioner

## 2022-02-12 VITALS — BP 131/85 | HR 76 | Temp 98.0°F | Ht 62.01 in | Wt 177.7 lb

## 2022-02-12 DIAGNOSIS — E1169 Type 2 diabetes mellitus with other specified complication: Secondary | ICD-10-CM | POA: Diagnosis not present

## 2022-02-12 DIAGNOSIS — K219 Gastro-esophageal reflux disease without esophagitis: Secondary | ICD-10-CM | POA: Diagnosis not present

## 2022-02-12 DIAGNOSIS — F3162 Bipolar disorder, current episode mixed, moderate: Secondary | ICD-10-CM

## 2022-02-12 DIAGNOSIS — E1149 Type 2 diabetes mellitus with other diabetic neurological complication: Secondary | ICD-10-CM | POA: Diagnosis not present

## 2022-02-12 DIAGNOSIS — E538 Deficiency of other specified B group vitamins: Secondary | ICD-10-CM

## 2022-02-12 DIAGNOSIS — E1159 Type 2 diabetes mellitus with other circulatory complications: Secondary | ICD-10-CM | POA: Diagnosis not present

## 2022-02-12 DIAGNOSIS — J4540 Moderate persistent asthma, uncomplicated: Secondary | ICD-10-CM | POA: Diagnosis not present

## 2022-02-12 DIAGNOSIS — F17210 Nicotine dependence, cigarettes, uncomplicated: Secondary | ICD-10-CM

## 2022-02-12 DIAGNOSIS — E785 Hyperlipidemia, unspecified: Secondary | ICD-10-CM

## 2022-02-12 DIAGNOSIS — Z7984 Long term (current) use of oral hypoglycemic drugs: Secondary | ICD-10-CM

## 2022-02-12 DIAGNOSIS — F4310 Post-traumatic stress disorder, unspecified: Secondary | ICD-10-CM

## 2022-02-12 DIAGNOSIS — F5101 Primary insomnia: Secondary | ICD-10-CM | POA: Diagnosis not present

## 2022-02-12 DIAGNOSIS — Z0000 Encounter for general adult medical examination without abnormal findings: Secondary | ICD-10-CM | POA: Diagnosis not present

## 2022-02-12 DIAGNOSIS — Z23 Encounter for immunization: Secondary | ICD-10-CM

## 2022-02-12 DIAGNOSIS — I152 Hypertension secondary to endocrine disorders: Secondary | ICD-10-CM

## 2022-02-12 DIAGNOSIS — Z79899 Other long term (current) drug therapy: Secondary | ICD-10-CM

## 2022-02-12 DIAGNOSIS — Z8781 Personal history of (healed) traumatic fracture: Secondary | ICD-10-CM

## 2022-02-12 DIAGNOSIS — Z1231 Encounter for screening mammogram for malignant neoplasm of breast: Secondary | ICD-10-CM

## 2022-02-12 DIAGNOSIS — T43505A Adverse effect of unspecified antipsychotics and neuroleptics, initial encounter: Secondary | ICD-10-CM

## 2022-02-12 DIAGNOSIS — E669 Obesity, unspecified: Secondary | ICD-10-CM

## 2022-02-12 DIAGNOSIS — G2581 Restless legs syndrome: Secondary | ICD-10-CM

## 2022-02-12 DIAGNOSIS — G2111 Neuroleptic induced parkinsonism: Secondary | ICD-10-CM

## 2022-02-12 DIAGNOSIS — Z5941 Food insecurity: Secondary | ICD-10-CM

## 2022-02-12 DIAGNOSIS — F1421 Cocaine dependence, in remission: Secondary | ICD-10-CM

## 2022-02-12 LAB — MICROALBUMIN, URINE WAIVED
Creatinine, Urine Waived: 50 mg/dL (ref 10–300)
Microalb, Ur Waived: 30 mg/L — ABNORMAL HIGH (ref 0–19)

## 2022-02-12 LAB — BAYER DCA HB A1C WAIVED: HB A1C (BAYER DCA - WAIVED): 5.8 % — ABNORMAL HIGH (ref 4.8–5.6)

## 2022-02-12 MED ORDER — FLUTICASONE FUROATE-VILANTEROL 100-25 MCG/ACT IN AEPB: 1.0000 | INHALATION_SPRAY | Freq: Every day | RESPIRATORY_TRACT | 4 refills | Status: DC

## 2022-02-12 MED ORDER — METFORMIN HCL ER 500 MG PO TB24: 500.0000 mg | ORAL_TABLET | Freq: Every day | ORAL | 4 refills | Status: DC

## 2022-02-12 MED ORDER — OMEGA-3-ACID ETHYL ESTERS 1 G PO CAPS: 1.0000 | ORAL_CAPSULE | Freq: Two times a day (BID) | ORAL | 4 refills | Status: DC

## 2022-02-12 MED ORDER — B-12 1000 MCG PO TBDP: 1000.0000 ug | ORAL_TABLET | Freq: Every day | ORAL | 4 refills | Status: AC

## 2022-02-12 MED ORDER — ALBUTEROL SULFATE HFA 108 (90 BASE) MCG/ACT IN AERS: INHALATION_SPRAY | RESPIRATORY_TRACT | 4 refills | Status: DC
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
## 2022-02-12 NOTE — Assessment & Plan Note (Signed)
Chronic, ongoing.  Recommend cessation of smoking.  Monitor closely for return to tobacco use.  Will continue Breo and Albuterol. Spirometry next visit.  Start lung CT screening at age 47.  Would benefit from no ACE in future and switch to ARB.

## 2022-02-12 NOTE — Assessment & Plan Note (Signed)
Chronic, stable.  BP at goal.  Will remain off Lisinopril for now, consider restart in future at 2.5 MG for kidney protection if needed.  Urine ALB 30 (January 2024).  A1c 5.8% today.  Recommend she monitor BP at home daily and document + bring to visits.  Focus on DASH diet.

## 2022-02-12 NOTE — Assessment & Plan Note (Signed)
Ongoing.  Continue Vitamin B12 supplement daily.  Recommend she take this daily, no missing doses.  Check CBC and B12.

## 2022-02-12 NOTE — Assessment & Plan Note (Signed)
Check Tegretol level today.

## 2022-02-12 NOTE — Assessment & Plan Note (Signed)
Chronic, stable with Trazodone.  Continue current medication regimen and adjust as needed.

## 2022-02-12 NOTE — Assessment & Plan Note (Signed)
I have recommended complete cessation of tobacco use. I have discussed various options available for assistance with tobacco cessation including over the counter methods (Nicotine gum, patch and lozenges). We also discussed prescription options (Chantix, Nicotine Inhaler / Nasal Spray). The patient is not interested in pursuing any prescription tobacco cessation options at this time.  

## 2022-02-12 NOTE — Assessment & Plan Note (Signed)
Chronic, stable.  Continue collaboration with neurology as needed.  Continue current medication regimen.

## 2022-02-12 NOTE — Assessment & Plan Note (Signed)
Chronic, ongoing.  Continue Omeprazole 20 MG BID.  Risks of PPI use were discussed with patient including bone loss, C. Diff diarrhea, pneumonia, infections, CKD, electrolyte abnormalities.  Pt. Verbalizes understanding and chooses to continue the medication.

## 2022-02-12 NOTE — Assessment & Plan Note (Signed)
Chronic, ongoing.  A1c 5.8% today.  Urine ALB 30 today (January 2024).  Will continue Metformin but reduce to 500 MG once a day and change to XL dosing.  Recommend she monitor BS at home every morning, fasting, and document for provider + bring to visits.  Focus on diabetic diet.

## 2022-02-12 NOTE — Assessment & Plan Note (Signed)
Chronic, ongoing.  Denies SI/HI.  Continue current medication regimen as prescribed and refer back to psychiatry as needed if worsening mood.  Would benefit from therapy, but refuses at this time.  Highly recommended this due to her past trauma history and difficulty with libido.

## 2022-02-12 NOTE — Assessment & Plan Note (Signed)
Chronic, ongoing.  Continue collaboration with neurology and current regimen as prescribed by them.

## 2022-02-12 NOTE — Progress Notes (Signed)
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 applicable   PATIENT COUNSELING:   Advised to take 1 mg of folate supplement per day if capable of pregnancy.   Sexuality: Discussed sexually transmitted diseases, partner selection, use of condoms, avoidance of unintended pregnancy  and contraceptive alternatives.   Advised to avoid cigarette smoking.  I discussed with the patient that most people either abstain from alcohol or drink within safe limits (<=14/week and <=4 drinks/occasion for males, <=7/weeks and <= 3 drinks/occasion for females) and that the risk for alcohol disorders and other health effects rises proportionally with the number of drinks per week and how  often a drinker exceeds daily limits.  Discussed cessation/primary prevention of drug use and availability of treatment for abuse.   Diet: Encouraged to adjust caloric intake to maintain  or achieve ideal body weight, to reduce intake of dietary saturated fat and total fat, to limit sodium intake by avoiding high sodium foods and not adding table salt, and to maintain adequate dietary potassium and calcium preferably from fresh fruits, vegetables, and low-fat dairy products.    Stressed the importance of regular exercise  Injury prevention: Discussed safety belts, safety helmets, smoke detector, smoking near bedding or upholstery.   Dental health: Discussed importance of regular tooth brushing, flossing, and dental visits.    NEXT PREVENTATIVE PHYSICAL DUE IN 1 YEAR. Return in about 3 months (around 05/14/2022) for T2DM, HTN/HLD, MOOD, RLS.

## 2022-02-12 NOTE — Assessment & Plan Note (Signed)
Continues to remain sustained from cocaine use, recommend continued cessation.

## 2022-02-12 NOTE — Assessment & Plan Note (Signed)
Chronic, ongoing.  Continue Atorvastatin 40 MG daily and adjust as needed, refills sent.  Lipid panel today.

## 2022-02-12 NOTE — Assessment & Plan Note (Signed)
On 10/26/21, continue collaboration with ortho.  Routine healing present.

## 2022-02-12 NOTE — Assessment & Plan Note (Signed)
Refer to diabetes with obesity plan of care.  Continue collaboration with neurology and current Gabapentin dosing.

## 2022-02-13 ENCOUNTER — Telehealth: Payer: Self-pay

## 2022-02-13 NOTE — Progress Notes (Signed)
Contacted via MyChart   Good evening Chauntelle, your labs have returned with exception of one still pending -- will alert you if this is abnormal: - Kidney function, creatinine and eGFR, remains normal, as is liver function, AST and ALT.  - Cholesterol levels show goal LDL, but triglycerides a bit elevated -- suspected you were not fasting due to appointment time.  Will recheck fasting in future.  Continue Atorvastatin and Lovaza. - Remainder of current labs stable.  Any questions? Keep being stellar!!  Thank you for allowing me to participate in your care.  I appreciate you. Kindest regards, Dariel Betzer

## 2022-02-13 NOTE — Telephone Encounter (Signed)
   Telephone encounter was:  Successful.  02/13/2022 Name: Vanessa Baker MRN: [id number] DOB: Apr 07, 1975  Harold Hedge Zale is a 47 y.o. year old female who is a primary care patient of Cannady, Barbaraann Faster, NP . The community resource team was consulted for assistance with Food Insecurity and Financial Difficulties related to financial strain  Care guide performed the following interventions: Patient provided with information about care guide support team and interviewed to confirm resource needs.Patient stated she needs assistance with food and utilities due to financial strain only having her 1 income and high utility bills. I have mailed and added a referral in [facility]  Follow Up Plan:  No further follow up planned at this time. The patient has been provided with needed resources.    [facility], Care Management  [phone] [address], [place],  [address] Phone: [phone] Email: [email or web address]

## 2022-02-14 ENCOUNTER — Encounter: Payer: Self-pay | Admitting: Nurse Practitioner

## 2022-02-14 LAB — COMPREHENSIVE METABOLIC PANEL
ALT: 13 IU/L (ref 0–32)
AST: 14 IU/L (ref 0–40)
Albumin/Globulin Ratio: 1.9 (ref 1.2–2.2)
Albumin: 4.4 g/dL (ref 3.9–4.9)
Alkaline Phosphatase: 141 IU/L — ABNORMAL HIGH (ref 44–121)
BUN/Creatinine Ratio: 8 — ABNORMAL LOW (ref 9–23)
BUN: 5 mg/dL — ABNORMAL LOW (ref 6–24)
Bilirubin Total: <0.2 mg/dL (ref 0.0–1.2)
CO2: 21 mmol/L (ref 20–29)
Calcium: 9.7 mg/dL (ref 8.7–10.2)
Chloride: 102 mmol/L (ref 96–106)
Creatinine, Ser: 0.61 mg/dL (ref 0.57–1.00)
Globulin, Total: 2.3 g/dL (ref 1.5–4.5)
Glucose: 123 mg/dL — ABNORMAL HIGH (ref 70–99)
Potassium: 4.1 mmol/L (ref 3.5–5.2)
Sodium: 138 mmol/L (ref 134–144)
Total Protein: 6.7 g/dL (ref 6.0–8.5)
eGFR: 110 mL/min/{1.73_m2} (ref >59)

## 2022-02-14 LAB — LIPID PANEL W/O CHOL/HDL RATIO
Cholesterol, Total: 132 mg/dL (ref 100–199)
HDL: 34 mg/dL — ABNORMAL LOW (ref >39)
LDL Chol Calc (NIH): 44 mg/dL (ref 0–99)
Triglycerides: 364 mg/dL — ABNORMAL HIGH (ref 0–149)
VLDL Cholesterol Cal: 54 mg/dL — ABNORMAL HIGH (ref 5–40)

## 2022-02-14 LAB — CBC WITH DIFFERENTIAL/PLATELET
Basophils Absolute: 0.2 10*3/uL (ref 0.0–0.2)
Basos: 2 %
EOS (ABSOLUTE): 0.3 10*3/uL (ref 0.0–0.4)
Eos: 3 %
Hematocrit: 43.2 % (ref 34.0–46.6)
Hemoglobin: 14.9 g/dL (ref 11.1–15.9)
Immature Grans (Abs): 0 10*3/uL (ref 0.0–0.1)
Immature Granulocytes: 0 %
Lymphocytes Absolute: 2.3 10*3/uL (ref 0.7–3.1)
Lymphs: 30 %
MCH: 30.1 pg (ref 26.6–33.0)
MCHC: 34.5 g/dL (ref 31.5–35.7)
MCV: 87 fL (ref 79–97)
Monocytes Absolute: 0.4 10*3/uL (ref 0.1–0.9)
Monocytes: 5 %
Neutrophils Absolute: 4.5 10*3/uL (ref 1.4–7.0)
Neutrophils: 60 %
Platelets: 294 10*3/uL (ref 150–450)
RBC: 4.95 x10E6/uL (ref 3.77–5.28)
RDW: 12 % (ref 11.7–15.4)
WBC: 7.6 10*3/uL (ref 3.4–10.8)

## 2022-02-14 LAB — TSH: TSH: 1.42 u[IU]/mL (ref 0.450–4.500)

## 2022-02-14 LAB — VITAMIN B12: Vitamin B-12: 535 pg/mL (ref 232–1245)

## 2022-02-14 LAB — MAGNESIUM: Magnesium: 1.8 mg/dL (ref 1.6–2.3)

## 2022-02-14 LAB — CARBAMAZEPINE LEVEL, TOTAL: Carbamazepine (Tegretol), S: 6.8 ug/mL (ref 4.0–12.0)

## 2022-02-18 ENCOUNTER — Other Ambulatory Visit: Payer: Self-pay | Admitting: Nurse Practitioner

## 2022-02-18 DIAGNOSIS — G2581 Restless legs syndrome: Secondary | ICD-10-CM

## 2022-02-19 NOTE — Telephone Encounter (Signed)
Requested Prescriptions  Pending Prescriptions Disp Refills   baclofen (LIORESAL) 10 MG tablet [Pharmacy Med Name: Baclofen 10 MG Oral Tablet] 90 tablet 0    Sig: Take 1 tablet by mouth three times daily as needed for muscle spasm     Analgesics:  Muscle Relaxants - baclofen Passed - 02/18/2022  7:41 AM      Passed - Cr in normal range and within 180 days    Creat  Date Value Ref Range Status  12/19/2018 0.68 0.50 - 1.10 mg/dL Final   Creatinine, Ser  Date Value Ref Range Status  02/12/2022 0.61 0.57 - 1.00 mg/dL Final         Passed - eGFR is 30 or above and within 180 days    GFR, Est African American  Date Value Ref Range Status  12/19/2018 123 > OR = 60 mL/min/1.75m2 Final   GFR calc Af Amer  Date Value Ref Range Status  01/13/2020 108 >59 mL/min/1.73 Final    Comment:    **In accordance with recommendations from the NKF-ASN Task force,**   [facility] is in the process of updating its eGFR calculation to the   2021 CKD-EPI creatinine equation that estimates kidney function   without a race variable.    GFR, Est Non African American  Date Value Ref Range Status  12/19/2018 106 > OR = 60 mL/min/1.79m2 Final   GFR, Estimated  Date Value Ref Range Status  11/09/2021 >60 >60 mL/min Final    Comment:    (NOTE) Calculated using the CKD-EPI Creatinine Equation (2021)    eGFR  Date Value Ref Range Status  02/12/2022 110 >59 mL/min/1.73 Final         Passed - Valid encounter within last 6 months    Recent Outpatient Visits           1 week ago Medicare annual wellness visit, subsequent   [facility] Latta, [place] T, NP   3 months ago Pruritus   [facility] Taylor, [place] T, NP   3 months ago Type 2 diabetes mellitus with obesity (HCC)   [facility] Mecum, Erin E, PA-C   8 months ago Type 2 diabetes mellitus with obesity (HCC)   [facility] Cannady, Jolene T, NP   10 months ago Bipolar 1 disorder, mixed, moderate  (HCC)   [facility] [place], Dorie Rank, NP       Future Appointments             In 2 months Cannady, Dorie Rank, NP [facility], [facility]

## 2022-03-13 ENCOUNTER — Other Ambulatory Visit: Payer: Self-pay | Admitting: Nurse Practitioner

## 2022-03-14 NOTE — Telephone Encounter (Signed)
Requested medication (s) are due for refill today: no  Requested medication (s) are on the active medication list: yes  Last refill:  11/21/21  Future visit scheduled: yes  Notes to clinic:  Unable to refill per protocol, cannot delegate.      Requested Prescriptions  Pending Prescriptions Disp Refills   triamcinolone cream (KENALOG) 0.1 % [Pharmacy Med Name: Triamcinolone Acetonide 0.1 % External Cream] 45 g 0    Sig: APPLY TOPICALLY TWICE DAILY     Not Delegated - Dermatology:  Corticosteroids Failed - 03/13/2022  6:41 AM      Failed - This refill cannot be delegated      Passed - Valid encounter within last 12 months    Recent Outpatient Visits           1 month ago Medicare annual wellness visit, subsequent   [place], [place] T, NP   3 months ago Pruritus   [place], [place] T, NP   4 months ago Type 2 diabetes mellitus with obesity ([place])   [place], Erin E, PA-C   9 months ago Type 2 diabetes mellitus with obesity ([place])   [place], [place] T, NP   11 months ago Bipolar 1 disorder, mixed, moderate ([place])   [place], Barbaraann Faster, NP       Future Appointments             In 2 months Cannady, Barbaraann Faster, NP [place], [facility]

## 2022-03-18 ENCOUNTER — Other Ambulatory Visit: Payer: Self-pay | Admitting: Nurse Practitioner

## 2022-03-18 DIAGNOSIS — F3162 Bipolar disorder, current episode mixed, moderate: Secondary | ICD-10-CM

## 2022-03-18 DIAGNOSIS — F4310 Post-traumatic stress disorder, unspecified: Secondary | ICD-10-CM

## 2022-03-19 DIAGNOSIS — S82891A Other fracture of right lower leg, initial encounter for closed fracture: Secondary | ICD-10-CM | POA: Diagnosis not present

## 2022-03-20 DIAGNOSIS — R519 Headache, unspecified: Secondary | ICD-10-CM | POA: Diagnosis not present

## 2022-03-20 DIAGNOSIS — G251 Drug-induced tremor: Secondary | ICD-10-CM | POA: Diagnosis not present

## 2022-03-20 DIAGNOSIS — G2581 Restless legs syndrome: Secondary | ICD-10-CM | POA: Diagnosis not present

## 2022-03-20 DIAGNOSIS — G250 Essential tremor: Secondary | ICD-10-CM | POA: Diagnosis not present

## 2022-03-24 ENCOUNTER — Other Ambulatory Visit: Payer: Self-pay | Admitting: Nurse Practitioner

## 2022-03-24 DIAGNOSIS — F4310 Post-traumatic stress disorder, unspecified: Secondary | ICD-10-CM

## 2022-03-26 NOTE — Telephone Encounter (Signed)
Unable to refill per protocol, Rx request is too soon. Last refill 02/24/21 for 90 and 4 refills.  Requested Prescriptions  Pending Prescriptions Disp Refills   OLANZapine (ZYPREXA) 20 MG tablet [Pharmacy Med Name: OLANZapine 20 MG Oral Tablet] 60 tablet 0    Sig: TAKE 1 TABLET BY MOUTH AT BEDTIME     Not Delegated - Psychiatry:  Antipsychotics - Second Generation (Atypical) - olanzapine Failed - 03/24/2022  3:17 PM      Failed - This refill cannot be delegated      Failed - Lipid Panel in normal range within the last 12 months    Cholesterol, Total  Date Value Ref Range Status  02/12/2022 132 100 - 199 mg/dL Final   LDL Cholesterol (Calc)  Date Value Ref Range Status  12/19/2018 54 mg/dL (calc) Final    Comment:    Reference range: <100 . Desirable range <100 mg/dL for primary prevention;   <70 mg/dL for patients with CHD or diabetic patients  with > or = 2 CHD risk factors. Marland Kitchen LDL-C is now calculated using the Martin-Hopkins  calculation, which is a validated novel method providing  better accuracy than the Friedewald equation in the  estimation of LDL-C.  Cresenciano Genre et al. Annamaria Helling. 6333;545(62): 2061-2068  (http://education.[facility].com/faq/FAQ164)    LDL Chol Calc (NIH)  Date Value Ref Range Status  02/12/2022 44 0 - 99 mg/dL Final   HDL  Date Value Ref Range Status  02/12/2022 34 (L) >39 mg/dL Final   Triglycerides  Date Value Ref Range Status  02/12/2022 364 (H) 0 - 149 mg/dL Final         Passed - TSH in normal range and within 360 days    TSH  Date Value Ref Range Status  02/12/2022 1.420 0.450 - 4.500 uIU/mL Final         Passed - Completed PHQ-2 or PHQ-9 in the last 360 days      Passed - Last BP in normal range    BP Readings from Last 1 Encounters:  02/12/22 131/85         Passed - Last Heart Rate in normal range    Pulse Readings from Last 1 Encounters:  02/12/22 76         Passed - Valid encounter within last 6 months    Recent  Outpatient Visits           1 month ago Medicare annual wellness visit, subsequent   [place], [place] T, NP   4 months ago Pruritus   [place], [place] T, NP   4 months ago Type 2 diabetes mellitus with obesity ([place])   [place], Erin E, PA-C   10 months ago Type 2 diabetes mellitus with obesity ([place])   [place], [place] T, NP   11 months ago Bipolar 1 disorder, mixed, moderate ([place])   [place], [place] T, NP       Future Appointments             In 1 month [place], [place] T, NP [place], [facility]            Passed - CBC within normal limits and completed in the last 12 months    WBC  Date Value Ref Range Status  02/12/2022 7.6 3.4 - 10.8 x10E3/uL Final  11/09/2021 13.5 (H) 4.0 -  10.5 K/uL Final   RBC  Date Value Ref Range Status  02/12/2022 4.95 3.77 - 5.28 x10E6/uL Final  11/09/2021 5.03 3.87 - 5.11 MIL/uL Final   Hemoglobin  Date Value Ref Range Status  02/12/2022 14.9 11.1 - 15.9 g/dL Final   Hematocrit  Date Value Ref Range Status  02/12/2022 43.2 34.0 - 46.6 % Final   MCHC  Date Value Ref Range Status  02/12/2022 34.5 31.5 - 35.7 g/dL Final  11/09/2021 33.0 30.0 - 36.0 g/dL Final   [facility]  Date Value Ref Range Status  02/12/2022 30.1 26.6 - 33.0 pg Final  11/09/2021 30.0 26.0 - 34.0 pg Final   MCV  Date Value Ref Range Status  02/12/2022 87 79 - 97 fL Final  06/11/2013 94 80 - 100 fL Final   No results found for: "PLTCOUNTKUC", "LABPLAT", "POCPLA" RDW  Date Value Ref Range Status  02/12/2022 12.0 11.7 - 15.4 % Final  06/11/2013 12.9 11.5 - 14.5 % Final         Passed - CMP within normal limits and completed in the last 12 months    Albumin  Date Value Ref Range Status  02/12/2022 4.4 3.9 - 4.9 g/dL Final  06/10/2013 3.4 3.4 - 5.0 g/dL Final    Alkaline Phosphatase  Date Value Ref Range Status  02/12/2022 141 (H) 44 - 121 IU/L Final  06/10/2013 57 Unit/L Final    Comment:    45-117 NOTE: New Reference Range 12/26/12    Alkaline phosphatase (APISO)  Date Value Ref Range Status  12/19/2018 54 31 - 125 U/L Final   ALT  Date Value Ref Range Status  02/12/2022 13 0 - 32 IU/L Final   SGPT (ALT)  Date Value Ref Range Status  06/10/2013 59 12 - 78 U/L Final   AST  Date Value Ref Range Status  02/12/2022 14 0 - 40 IU/L Final   SGOT(AST)  Date Value Ref Range Status  06/10/2013 102 (H) 15 - 37 Unit/L Final   BUN  Date Value Ref Range Status  02/12/2022 5 (L) 6 - 24 mg/dL Final  06/11/2013 14 7 - 18 mg/dL Final   Calcium  Date Value Ref Range Status  02/12/2022 9.7 8.7 - 10.2 mg/dL Final   Calcium, Total  Date Value Ref Range Status  06/11/2013 8.7 8.5 - 10.1 mg/dL Final   CO2  Date Value Ref Range Status  02/12/2022 21 20 - 29 mmol/L Final   Co2  Date Value Ref Range Status  06/11/2013 30 21 - 32 mmol/L Final   Bicarbonate  Date Value Ref Range Status  04/29/2019 32.0 (H) 20.0 - 28.0 mmol/L Final   Creat  Date Value Ref Range Status  12/19/2018 0.68 0.50 - 1.10 mg/dL Final   Creatinine, Ser  Date Value Ref Range Status  02/12/2022 0.61 0.57 - 1.00 mg/dL Final   Glucose  Date Value Ref Range Status  02/12/2022 123 (H) 70 - 99 mg/dL Final  12/09/2014 88 mg/dL Final  06/11/2013 81 65 - 99 mg/dL Final   Glucose, Bld  Date Value Ref Range Status  11/09/2021 214 (H) 70 - 99 mg/dL Final    Comment:    Glucose reference range applies only to samples taken after fasting for at least 8 hours.   POC Glucose  Date Value Ref Range Status  12/24/2017 164 (A) 70 - 99 mg/dl Final   Glucose-Capillary  Date Value Ref Range Status  10/28/2021 261 (H) 70 - 99 mg/dL  Final    Comment:    Glucose reference range applies only to samples taken after fasting for at least 8 hours.   Potassium  Date Value  Ref Range Status  02/12/2022 4.1 3.5 - 5.2 mmol/L Final  06/11/2013 3.5 3.5 - 5.1 mmol/L Final   Sodium  Date Value Ref Range Status  02/12/2022 138 134 - 144 mmol/L Final  06/11/2013 136 136 - 145 mmol/L Final   Bilirubin,Total  Date Value Ref Range Status  06/10/2013 0.4 0.2 - 1.0 mg/dL Final   Bilirubin Total  Date Value Ref Range Status  02/12/2022 <0.2 0.0 - 1.2 mg/dL Final   Bilirubin, Total  Date Value Ref Range Status  03/24/2015 0.1 mg/dL Final   Bilirubin, Direct  Date Value Ref Range Status  11/09/2021 0.1 0.0 - 0.2 mg/dL Final   Indirect Bilirubin  Date Value Ref Range Status  11/09/2021 0.6 0.3 - 0.9 mg/dL Final    Comment:    Performed at [facility], [address] [place], [place] [address]   Protein, ur  Date Value Ref Range Status  11/09/2021 30 (A) NEGATIVE mg/dL Final   Total Protein  Date Value Ref Range Status  02/12/2022 6.7 6.0 - 8.5 g/dL Final  06/10/2013 7.4 6.4 - 8.2 g/dL Final   GFR, Est African American  Date Value Ref Range Status  12/19/2018 123 > OR = 60 mL/min/1.34m2 Final   GFR calc Af Amer  Date Value Ref Range Status  01/13/2020 108 >59 mL/min/1.73 Final    Comment:    **In accordance with recommendations from the NKF-ASN Task force,**   [facility] is in the process of updating its eGFR calculation to the   2021 CKD-EPI creatinine equation that estimates kidney function   without a race variable.    eGFR  Date Value Ref Range Status  02/12/2022 110 >59 mL/min/1.73 Final   GFR, Est Non African American  Date Value Ref Range Status  12/19/2018 106 > OR = 60 mL/min/1.4m2 Final   GFR, Estimated  Date Value Ref Range Status  11/09/2021 >60 >60 mL/min Final    Comment:    (NOTE) Calculated using the CKD-EPI Creatinine Equation (2021)

## 2022-03-31 ENCOUNTER — Other Ambulatory Visit: Payer: Self-pay | Admitting: Nurse Practitioner

## 2022-03-31 DIAGNOSIS — F4310 Post-traumatic stress disorder, unspecified: Secondary | ICD-10-CM

## 2022-04-02 ENCOUNTER — Other Ambulatory Visit: Payer: Self-pay | Admitting: Nurse Practitioner

## 2022-04-02 DIAGNOSIS — F4310 Post-traumatic stress disorder, unspecified: Secondary | ICD-10-CM

## 2022-04-02 MED ORDER — OLANZAPINE 20 MG PO TABS: 20.0000 mg | ORAL_TABLET | Freq: Every day | ORAL | 4 refills | Status: DC

## 2022-04-02 NOTE — Telephone Encounter (Signed)
Requested medication (s) are due for refill today: yes  Requested medication (s) are on the active medication list: yes  Last refill:  02/24/21 #90/4  Future visit scheduled: yes  Notes to clinic:  not delegated, rx has expired.      Requested Prescriptions  Pending Prescriptions Disp Refills   OLANZapine (ZYPREXA) 20 MG tablet [Pharmacy Med Name: OLANZapine 20 MG Oral Tablet] 60 tablet 0    Sig: TAKE 1 TABLET BY MOUTH AT BEDTIME     Not Delegated - Psychiatry:  Antipsychotics - Second Generation (Atypical) - olanzapine Failed - 03/31/2022 11:49 PM      Failed - This refill cannot be delegated      Failed - Lipid Panel in normal range within the last 12 months    Cholesterol, Total  Date Value Ref Range Status  02/12/2022 132 100 - 199 mg/dL Final   LDL Cholesterol (Calc)  Date Value Ref Range Status  12/19/2018 54 mg/dL (calc) Final    Comment:    Reference range: <100 . Desirable range <100 mg/dL for primary prevention;   <70 mg/dL for patients with CHD or diabetic patients  with > or = 2 CHD risk factors. Marland Kitchen LDL-C is now calculated using the Martin-Hopkins  calculation, which is a validated novel method providing  better accuracy than the Friedewald equation in the  estimation of LDL-C.  Cresenciano Genre et al. Annamaria Helling. [id number]): 2061-2068  (http://education.[facility].com/faq/FAQ164)    LDL Chol Calc (NIH)  Date Value Ref Range Status  02/12/2022 44 0 - 99 mg/dL Final   HDL  Date Value Ref Range Status  02/12/2022 34 (L) >39 mg/dL Final   Triglycerides  Date Value Ref Range Status  02/12/2022 364 (H) 0 - 149 mg/dL Final         Passed - TSH in normal range and within 360 days    TSH  Date Value Ref Range Status  02/12/2022 1.420 0.450 - 4.500 uIU/mL Final         Passed - Completed PHQ-2 or PHQ-9 in the last 360 days      Passed - Last BP in normal range    BP Readings from Last 1 Encounters:  02/12/22 131/85         Passed - Last Heart Rate in  normal range    Pulse Readings from Last 1 Encounters:  02/12/22 76         Passed - Valid encounter within last 6 months    Recent Outpatient Visits           1 month ago Medicare annual wellness visit, subsequent   [place], [place] T, NP   4 months ago Pruritus   [place], [place] T, NP   4 months ago Type 2 diabetes mellitus with obesity ([place])   [place], Erin E, PA-C   10 months ago Type 2 diabetes mellitus with obesity ([place])   [place], [place] T, NP   11 months ago Bipolar 1 disorder, mixed, moderate ([place])   [place], Barbaraann Faster, NP       Future Appointments             In 1 month Cannady, Barbaraann Faster, NP [place], [facility]            Passed - CBC within normal limits and completed in the last  12 months    WBC  Date Value Ref Range Status  02/12/2022 7.6 3.4 - 10.8 x10E3/uL Final  11/09/2021 13.5 (H) 4.0 - 10.5 K/uL Final   RBC  Date Value Ref Range Status  02/12/2022 4.95 3.77 - 5.28 x10E6/uL Final  11/09/2021 5.03 3.87 - 5.11 MIL/uL Final   Hemoglobin  Date Value Ref Range Status  02/12/2022 14.9 11.1 - 15.9 g/dL Final   Hematocrit  Date Value Ref Range Status  02/12/2022 43.2 34.0 - 46.6 % Final   MCHC  Date Value Ref Range Status  02/12/2022 34.5 31.5 - 35.7 g/dL Final  11/09/2021 33.0 30.0 - 36.0 g/dL Final   [facility]  Date Value Ref Range Status  02/12/2022 30.1 26.6 - 33.0 pg Final  11/09/2021 30.0 26.0 - 34.0 pg Final   MCV  Date Value Ref Range Status  02/12/2022 87 79 - 97 fL Final  06/11/2013 94 80 - 100 fL Final   No results found for: "PLTCOUNTKUC", "LABPLAT", "POCPLA" RDW  Date Value Ref Range Status  02/12/2022 12.0 11.7 - 15.4 % Final  06/11/2013 12.9 11.5 - 14.5 % Final         Passed - CMP within normal limits and completed in the last  12 months    Albumin  Date Value Ref Range Status  02/12/2022 4.4 3.9 - 4.9 g/dL Final  06/10/2013 3.4 3.4 - 5.0 g/dL Final   Alkaline Phosphatase  Date Value Ref Range Status  02/12/2022 141 (H) 44 - 121 IU/L Final  06/10/2013 57 Unit/L Final    Comment:    45-117 NOTE: New Reference Range 12/26/12    Alkaline phosphatase (APISO)  Date Value Ref Range Status  12/19/2018 54 31 - 125 U/L Final   ALT  Date Value Ref Range Status  02/12/2022 13 0 - 32 IU/L Final   SGPT (ALT)  Date Value Ref Range Status  06/10/2013 59 12 - 78 U/L Final   AST  Date Value Ref Range Status  02/12/2022 14 0 - 40 IU/L Final   SGOT(AST)  Date Value Ref Range Status  06/10/2013 102 (H) 15 - 37 Unit/L Final   BUN  Date Value Ref Range Status  02/12/2022 5 (L) 6 - 24 mg/dL Final  06/11/2013 14 7 - 18 mg/dL Final   Calcium  Date Value Ref Range Status  02/12/2022 9.7 8.7 - 10.2 mg/dL Final   Calcium, Total  Date Value Ref Range Status  06/11/2013 8.7 8.5 - 10.1 mg/dL Final   CO2  Date Value Ref Range Status  02/12/2022 21 20 - 29 mmol/L Final   Co2  Date Value Ref Range Status  06/11/2013 30 21 - 32 mmol/L Final   Bicarbonate  Date Value Ref Range Status  04/29/2019 32.0 (H) 20.0 - 28.0 mmol/L Final   Creat  Date Value Ref Range Status  12/19/2018 0.68 0.50 - 1.10 mg/dL Final   Creatinine, Ser  Date Value Ref Range Status  02/12/2022 0.61 0.57 - 1.00 mg/dL Final   Glucose  Date Value Ref Range Status  02/12/2022 123 (H) 70 - 99 mg/dL Final  12/09/2014 88 mg/dL Final  06/11/2013 81 65 - 99 mg/dL Final   Glucose, Bld  Date Value Ref Range Status  11/09/2021 214 (H) 70 - 99 mg/dL Final    Comment:    Glucose reference range applies only to samples taken after fasting for at least 8 hours.   POC Glucose  Date Value Ref Range Status  12/24/2017 164 (A) 70 - 99 mg/dl Final   Glucose-Capillary  Date Value Ref Range Status  10/28/2021 261 (H) 70 - 99 mg/dL Final     Comment:    Glucose reference range applies only to samples taken after fasting for at least 8 hours.   Potassium  Date Value Ref Range Status  02/12/2022 4.1 3.5 - 5.2 mmol/L Final  06/11/2013 3.5 3.5 - 5.1 mmol/L Final   Sodium  Date Value Ref Range Status  02/12/2022 138 134 - 144 mmol/L Final  06/11/2013 136 136 - 145 mmol/L Final   Bilirubin,Total  Date Value Ref Range Status  06/10/2013 0.4 0.2 - 1.0 mg/dL Final   Bilirubin Total  Date Value Ref Range Status  02/12/2022 <0.2 0.0 - 1.2 mg/dL Final   Bilirubin, Total  Date Value Ref Range Status  03/24/2015 0.1 mg/dL Final   Bilirubin, Direct  Date Value Ref Range Status  11/09/2021 0.1 0.0 - 0.2 mg/dL Final   Indirect Bilirubin  Date Value Ref Range Status  11/09/2021 0.6 0.3 - 0.9 mg/dL Final    Comment:    Performed at [facility], [address] [place], [place] [address]   Protein, ur  Date Value Ref Range Status  11/09/2021 30 (A) NEGATIVE mg/dL Final   Total Protein  Date Value Ref Range Status  02/12/2022 6.7 6.0 - 8.5 g/dL Final  06/10/2013 7.4 6.4 - 8.2 g/dL Final   GFR, Est African American  Date Value Ref Range Status  12/19/2018 123 > OR = 60 mL/min/1.66m Final   GFR calc Af Amer  Date Value Ref Range Status  01/13/2020 108 >59 mL/min/1.73 Final    Comment:    **In accordance with recommendations from the NKF-ASN Task force,**   [facility] is in the process of updating its eGFR calculation to the   2021 CKD-EPI creatinine equation that estimates kidney function   without a race variable.    eGFR  Date Value Ref Range Status  02/12/2022 110 >59 mL/min/1.73 Final   GFR, Est Non African American  Date Value Ref Range Status  12/19/2018 106 > OR = 60 mL/min/1.713mFinal   GFR, Estimated  Date Value Ref Range Status  11/09/2021 >60 >60 mL/min Final    Comment:    (NOTE) Calculated using the CKD-EPI Creatinine Equation (2021)

## 2022-04-10 ENCOUNTER — Other Ambulatory Visit: Payer: Self-pay | Admitting: Nurse Practitioner

## 2022-04-10 NOTE — Telephone Encounter (Signed)
Requested medication (s) are due for refill today: Yes  Requested medication (s) are on the active medication list: Yes  Last refill:  03/14/22  Future visit scheduled: Yes  Notes to clinic:  See request.    Requested Prescriptions  Pending Prescriptions Disp Refills   triamcinolone cream (KENALOG) 0.1 % [Pharmacy Med Name: Triamcinolone Acetonide 0.1 % External Cream] 45 g 0    Sig: APPLY  CREAM [place]     Not Delegated - Dermatology:  Corticosteroids Failed - 04/10/2022 10:12 AM      Failed - This refill cannot be delegated      Passed - Valid encounter within last 12 months    Recent Outpatient Visits           1 month ago Medicare annual wellness visit, subsequent   [place], [place] T, NP   4 months ago Pruritus   [place], [place] T, NP   5 months ago Type 2 diabetes mellitus with obesity ([place])   [place], Erin E, PA-C   10 months ago Type 2 diabetes mellitus with obesity ([place])   [place], Henrine [place] T, NP   1 year ago Bipolar 1 disorder, mixed, moderate ([place])   [place], Barbaraann Faster, NP       Future Appointments             In 1 month Cannady, Barbaraann Faster, NP [place], [facility]

## 2022-05-12 ENCOUNTER — Other Ambulatory Visit: Payer: Self-pay | Admitting: Nurse Practitioner

## 2022-05-12 DIAGNOSIS — F3162 Bipolar disorder, current episode mixed, moderate: Secondary | ICD-10-CM

## 2022-05-12 DIAGNOSIS — F4310 Post-traumatic stress disorder, unspecified: Secondary | ICD-10-CM

## 2022-05-12 DIAGNOSIS — E1169 Type 2 diabetes mellitus with other specified complication: Secondary | ICD-10-CM

## 2022-05-14 ENCOUNTER — Ambulatory Visit: Payer: 59 | Admitting: Nurse Practitioner

## 2022-05-14 DIAGNOSIS — F3162 Bipolar disorder, current episode mixed, moderate: Secondary | ICD-10-CM

## 2022-05-14 DIAGNOSIS — E1149 Type 2 diabetes mellitus with other diabetic neurological complication: Secondary | ICD-10-CM

## 2022-05-14 DIAGNOSIS — E538 Deficiency of other specified B group vitamins: Secondary | ICD-10-CM

## 2022-05-14 DIAGNOSIS — F1011 Alcohol abuse, in remission: Secondary | ICD-10-CM

## 2022-05-14 DIAGNOSIS — F5101 Primary insomnia: Secondary | ICD-10-CM

## 2022-05-14 DIAGNOSIS — G2581 Restless legs syndrome: Secondary | ICD-10-CM

## 2022-05-14 DIAGNOSIS — E1169 Type 2 diabetes mellitus with other specified complication: Secondary | ICD-10-CM

## 2022-05-14 DIAGNOSIS — F17210 Nicotine dependence, cigarettes, uncomplicated: Secondary | ICD-10-CM

## 2022-05-14 DIAGNOSIS — F1421 Cocaine dependence, in remission: Secondary | ICD-10-CM

## 2022-05-14 DIAGNOSIS — J4540 Moderate persistent asthma, uncomplicated: Secondary | ICD-10-CM

## 2022-05-14 DIAGNOSIS — E1159 Type 2 diabetes mellitus with other circulatory complications: Secondary | ICD-10-CM

## 2022-05-14 DIAGNOSIS — F4310 Post-traumatic stress disorder, unspecified: Secondary | ICD-10-CM

## 2022-05-14 NOTE — Telephone Encounter (Signed)
Requested Prescriptions  Pending Prescriptions Disp Refills   atorvastatin (LIPITOR) 40 MG tablet [Pharmacy Med Name: Atorvastatin Calcium 40 MG Oral Tablet] 90 tablet 0    Sig: Take 1 tablet by mouth once daily     Cardiovascular:  Antilipid - Statins Failed - 05/12/2022  6:43 AM      Failed - Lipid Panel in normal range within the last 12 months    Cholesterol, Total  Date Value Ref Range Status  02/12/2022 132 100 - 199 mg/dL Final   LDL Cholesterol (Calc)  Date Value Ref Range Status  12/19/2018 54 mg/dL (calc) Final    Comment:    Reference range: <100 . Desirable range <100 mg/dL for primary prevention;   <70 mg/dL for patients with CHD or diabetic patients  with > or = 2 CHD risk factors. Marland Kitchen LDL-C is now calculated using the Martin-Hopkins  calculation, which is a validated novel method providing  better accuracy than the Friedewald equation in the  estimation of LDL-C.  Horald Pollen et al. Lenox Ahr. 8616;837(29): 2061-2068  (http://education.[facility].com/faq/FAQ164)    LDL Chol Calc (NIH)  Date Value Ref Range Status  02/12/2022 44 0 - 99 mg/dL Final   HDL  Date Value Ref Range Status  02/12/2022 34 (L) >39 mg/dL Final   Triglycerides  Date Value Ref Range Status  02/12/2022 364 (H) 0 - 149 mg/dL Final         Passed - Patient is not pregnant      Passed - Valid encounter within last 12 months    Recent Outpatient Visits           3 months ago Medicare annual wellness visit, subsequent   [place] [facility] [place], [place] T, NP   5 months ago Pruritus   [place] [facility] [place], [place] T, NP   6 months ago Type 2 diabetes mellitus with obesity (HCC)   [place] [facility] Mecum, Erin E, PA-C   11 months ago Type 2 diabetes mellitus with obesity (HCC)   [place] [facility] [place], [place] T, NP   1 year ago Bipolar 1 disorder, mixed, moderate (HCC)   [place] [facility] [place], Carrington T, NP       Future Appointments             Today Cannady, Dorie Rank, NP [place] [facility], [facility]             sertraline (ZOLOFT) 50 MG tablet [Pharmacy Med Name: Sertraline HCl 50 MG Oral Tablet] 90 tablet 0    Sig: Take 1 tablet by mouth once daily     Psychiatry:  Antidepressants - SSRI - sertraline Passed - 05/12/2022  6:43 AM      Passed - AST in normal range and within 360 days    AST  Date Value Ref Range Status  02/12/2022 14 0 - 40 IU/L Final   SGOT(AST)  Date Value Ref Range Status  06/10/2013 102 (H) 15 - 37 Unit/L Final         Passed - ALT in normal range and within 360 days    ALT  Date Value Ref Range Status  02/12/2022 13 0 - 32 IU/L Final   SGPT (ALT)  Date Value Ref Range Status  06/10/2013 59 12 - 78 U/L Final         Passed - Completed PHQ-2 or PHQ-9 in the last 360 days  Passed - Valid encounter within last 6 months    Recent Outpatient Visits           3 months ago Medicare annual wellness visit, subsequent   [place] [facility] [place], [place] T, NP   5 months ago Pruritus   [place] [facility] [place], [place] T, NP   6 months ago Type 2 diabetes mellitus with obesity (HCC)   [place] [facility] Mecum, Erin E, PA-C   11 months ago Type 2 diabetes mellitus with obesity (HCC)   [place] [facility] [place], Corrie Dandy T, NP   1 year ago Bipolar 1 disorder, mixed, moderate (HCC)   [place] [facility] Raymond, Dorie Rank, NP       Future Appointments             Today Marjie Skiff, NP [place] [facility], [facility]

## 2022-05-23 ENCOUNTER — Ambulatory Visit (INDEPENDENT_AMBULATORY_CARE_PROVIDER_SITE_OTHER): Payer: 59 | Admitting: Nurse Practitioner

## 2022-05-23 ENCOUNTER — Encounter: Payer: Self-pay | Admitting: Nurse Practitioner

## 2022-05-23 VITALS — BP 118/78 | HR 86 | Temp 98.2°F | Ht 62.01 in | Wt 173.3 lb

## 2022-05-23 DIAGNOSIS — F3162 Bipolar disorder, current episode mixed, moderate: Secondary | ICD-10-CM | POA: Diagnosis not present

## 2022-05-23 DIAGNOSIS — E1159 Type 2 diabetes mellitus with other circulatory complications: Secondary | ICD-10-CM | POA: Diagnosis not present

## 2022-05-23 DIAGNOSIS — E1149 Type 2 diabetes mellitus with other diabetic neurological complication: Secondary | ICD-10-CM | POA: Diagnosis not present

## 2022-05-23 DIAGNOSIS — F17210 Nicotine dependence, cigarettes, uncomplicated: Secondary | ICD-10-CM

## 2022-05-23 DIAGNOSIS — E1169 Type 2 diabetes mellitus with other specified complication: Secondary | ICD-10-CM

## 2022-05-23 DIAGNOSIS — G2111 Neuroleptic induced parkinsonism: Secondary | ICD-10-CM | POA: Diagnosis not present

## 2022-05-23 DIAGNOSIS — E538 Deficiency of other specified B group vitamins: Secondary | ICD-10-CM | POA: Diagnosis not present

## 2022-05-23 DIAGNOSIS — F4310 Post-traumatic stress disorder, unspecified: Secondary | ICD-10-CM

## 2022-05-23 DIAGNOSIS — F5101 Primary insomnia: Secondary | ICD-10-CM | POA: Diagnosis not present

## 2022-05-23 DIAGNOSIS — L0291 Cutaneous abscess, unspecified: Secondary | ICD-10-CM | POA: Diagnosis not present

## 2022-05-23 DIAGNOSIS — J4540 Moderate persistent asthma, uncomplicated: Secondary | ICD-10-CM

## 2022-05-23 DIAGNOSIS — E669 Obesity, unspecified: Secondary | ICD-10-CM

## 2022-05-23 DIAGNOSIS — F1421 Cocaine dependence, in remission: Secondary | ICD-10-CM

## 2022-05-23 DIAGNOSIS — E785 Hyperlipidemia, unspecified: Secondary | ICD-10-CM

## 2022-05-23 DIAGNOSIS — I152 Hypertension secondary to endocrine disorders: Secondary | ICD-10-CM

## 2022-05-23 DIAGNOSIS — G2581 Restless legs syndrome: Secondary | ICD-10-CM

## 2022-05-23 DIAGNOSIS — T43505A Adverse effect of unspecified antipsychotics and neuroleptics, initial encounter: Secondary | ICD-10-CM

## 2022-05-23 DIAGNOSIS — F1011 Alcohol abuse, in remission: Secondary | ICD-10-CM

## 2022-05-23 LAB — BAYER DCA HB A1C WAIVED: HB A1C (BAYER DCA - WAIVED): 6.6 % — ABNORMAL HIGH (ref 4.8–5.6)

## 2022-05-23 MED ORDER — MUPIROCIN 2 % EX OINT: 1.0000 | TOPICAL_OINTMENT | Freq: Two times a day (BID) | CUTANEOUS | 0 refills | Status: AC

## 2022-05-23 MED ORDER — FLUCONAZOLE 150 MG PO TABS: 150.0000 mg | ORAL_TABLET | Freq: Once | ORAL | 0 refills | Status: AC

## 2022-05-23 MED ORDER — CARBAMAZEPINE 200 MG PO TABS
ORAL_TABLET | ORAL | 4 refills | Status: DC
Start: 2022-05-23 — End: 2022-11-23

## 2022-05-23 MED ORDER — BACLOFEN 10 MG PO TABS: 10.0000 mg | ORAL_TABLET | Freq: Three times a day (TID) | ORAL | 4 refills | Status: DC | PRN

## 2022-05-23 MED ORDER — DOXYCYCLINE HYCLATE 100 MG PO TABS: 100.0000 mg | ORAL_TABLET | Freq: Two times a day (BID) | ORAL | 0 refills | Status: AC

## 2022-05-23 MED ORDER — ATORVASTATIN CALCIUM 40 MG PO TABS: 40.0000 mg | ORAL_TABLET | Freq: Every day | ORAL | 4 refills | Status: DC
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
## 2022-05-23 NOTE — Assessment & Plan Note (Signed)
Chronic, ongoing.  Continue Atorvastatin 40 MG daily and adjust as needed, refills sent.  Lipid panel today. 

## 2022-05-23 NOTE — Assessment & Plan Note (Signed)
Chronic, stable.  Continue collaboration with neurology as needed.  Continue current medication regimen. 

## 2022-05-23 NOTE — Assessment & Plan Note (Signed)
Remains alcohol free at this time, recommend continued cessation.

## 2022-05-23 NOTE — Assessment & Plan Note (Signed)
Chronic, stable with Trazodone.  Continue current medication regimen and adjust as needed.  Sleep study referral placed.

## 2022-05-23 NOTE — Assessment & Plan Note (Addendum)
Chronic, stable.  BP at goal.  Will remain off Lisinopril for now, consider low dose ARB in future for kidney protection if needed -- avoid hypotension.  Urine ALB 30 (January 2024).  A1c 6.6% today.  Recommend Vanessa Baker monitor BP at home daily and document + bring to visits.  Focus on DASH diet.  LABS: CMP.  Referral for sleep study.

## 2022-05-23 NOTE — Patient Instructions (Signed)
Diabetes Mellitus Basics  Diabetes mellitus, or diabetes, is a long-term (chronic) disease. It occurs when the body does not properly use sugar (glucose) that is released from food after you eat. Diabetes mellitus may be caused by one or both of these problems: Your pancreas does not make enough of a hormone called insulin. Your body does not react in a normal way to the insulin that it makes. Insulin lets glucose enter cells in your body. This gives you energy. If you have diabetes, glucose cannot get into cells. This causes high blood glucose (hyperglycemia). How to treat and manage diabetes You may need to take insulin or other diabetes medicines daily to keep your glucose in balance. If you are prescribed insulin, you will learn how to give yourself insulin by injection. You may need to adjust the amount of insulin you take based on the foods that you eat. You will need to check your blood glucose levels using a glucose monitor as told by your health care provider. The readings can help determine if you have low or high blood glucose. Generally, you should have these blood glucose levels: Before meals (preprandial): 80-130 mg/dL (4.4-7.2 mmol/L). After meals (postprandial): below 180 mg/dL (10 mmol/L). Hemoglobin A1c (HbA1c) level: less than 7%. Your health care provider will set treatment goals for you. Keep all follow-up visits. This is important. Follow these instructions at home: Diabetes medicines Take your diabetes medicines every day as told by your health care provider. List your diabetes medicines here: Name of medicine: ______________________________ Amount (dose): _______________ Time (a.m./p.m.): _______________ Notes: ___________________________________ Name of medicine: ______________________________ Amount (dose): _______________ Time (a.m./p.m.): _______________ Notes: ___________________________________ Name of medicine: ______________________________ Amount (dose):  _______________ Time (a.m./p.m.): _______________ Notes: ___________________________________ Insulin If you use insulin, list the types of insulin you use here: Insulin type: ______________________________ Amount (dose): _______________ Time (a.m./p.m.): _______________Notes: ___________________________________ Insulin type: ______________________________ Amount (dose): _______________ Time (a.m./p.m.): _______________ Notes: ___________________________________ Insulin type: ______________________________ Amount (dose): _______________ Time (a.m./p.m.): _______________ Notes: ___________________________________ Insulin type: ______________________________ Amount (dose): _______________ Time (a.m./p.m.): _______________ Notes: ___________________________________ Insulin type: ______________________________ Amount (dose): _______________ Time (a.m./p.m.): _______________ Notes: ___________________________________ Managing blood glucose  Check your blood glucose levels using a glucose monitor as told by your health care provider. Write down the times that you check your glucose levels here: Time: _______________ Notes: ___________________________________ Time: _______________ Notes: ___________________________________ Time: _______________ Notes: ___________________________________ Time: _______________ Notes: ___________________________________ Time: _______________ Notes: ___________________________________ Time: _______________ Notes: ___________________________________  Low blood glucose Low blood glucose (hypoglycemia) is when glucose is at or below 70 mg/dL (3.9 mmol/L). Symptoms may include: Feeling: Hungry. Sweaty and clammy. Irritable or easily upset. Dizzy. Sleepy. Having: A fast heartbeat. A headache. A change in your vision. Numbness around the mouth, lips, or tongue. Having trouble with: Moving (coordination). Sleeping. Treating low blood glucose To treat low blood  glucose, eat or drink something containing sugar right away. If you can think clearly and swallow safely, follow the 15:15 rule: Take 15 grams of a fast-acting carb (carbohydrate), as told by your health care provider. Some fast-acting carbs are: Glucose tablets: take 3-4 tablets. Hard candy: eat 3-5 pieces. Fruit juice: drink 4 oz (120 mL). Regular (not diet) soda: drink 4-6 oz (120-180 mL). Honey or sugar: eat 1 Tbsp (15 mL). Check your blood glucose levels 15 minutes after you take the carb. If your glucose is still at or below 70 mg/dL (3.9 mmol/L), take 15 grams of a carb again. If your glucose does not go above 70 mg/dL (3.9 mmol/L) after   3 tries, get help right away. After your glucose goes back to normal, eat a meal or a snack within 1 hour. Treating very low blood glucose If your glucose is at or below 54 mg/dL (3 mmol/L), you have very low blood glucose (severe hypoglycemia). This is an emergency. Do not wait to see if the symptoms will go away. Get medical help right away. Call your local emergency services (911 in the [place] Do not drive yourself to the hospital. Questions to ask your health care provider Should I talk with a diabetes educator? What equipment will I need to care for myself at home? What diabetes medicines do I need? When should I take them? How often do I need to check my blood glucose levels? What number can I call if I have questions? When is my follow-up visit? Where can I find a support group for people with diabetes? Where to find more information American Diabetes Association: www.diabetes.org Association of Diabetes Care and Education Specialists: www.diabeteseducator.org Contact a health care provider if: Your blood glucose is at or above 240 mg/dL (13.3 mmol/L) for 2 days in a row. You have been sick or have had a fever for 2 days or more, and you are not getting better. You have any of these problems for more than 6 hours: You cannot eat or  drink. You feel nauseous. You vomit. You have diarrhea. Get help right away if: Your blood glucose is lower than 54 mg/dL (3 mmol/L). You get confused. You have trouble thinking clearly. You have trouble breathing. These symptoms may represent a serious problem that is an emergency. Do not wait to see if the symptoms will go away. Get medical help right away. Call your local emergency services (911 in the [place] Do not drive yourself to the hospital. Summary Diabetes mellitus is a chronic disease that occurs when the body does not properly use sugar (glucose) that is released from food after you eat. Take insulin and diabetes medicines as told. Check your blood glucose every day, as often as told. Keep all follow-up visits. This is important. This information is not intended to replace advice given to you by your health care provider. Make sure you discuss any questions you have with your health care provider. Document Revised: 05/26/2019 Document Reviewed: 05/26/2019 Elsevier Patient Education  2023 Elsevier Inc.  

## 2022-05-23 NOTE — Assessment & Plan Note (Signed)
Chronic, ongoing.  Denies SI/HI.  Continue current medication regimen as prescribed and refer back to psychiatry at this time.  Try [facility].  Would benefit from therapy, but refuses at this time.  Highly recommended this due to her past trauma history and difficulty with libido.

## 2022-05-23 NOTE — Assessment & Plan Note (Signed)
Chronic, ongoing.  Recommend cessation of smoking.  Monitor closely for return to tobacco use.  Will continue Breo and Albuterol. Spirometry next visit.  Start lung CT screening at age 48.  Would benefit from no ACE in future and switch to ARB. 

## 2022-05-23 NOTE — Assessment & Plan Note (Signed)
Refer to diabetes with obesity plan of care.  Continue collaboration with neurology and current Gabapentin dosing. 

## 2022-05-23 NOTE — Assessment & Plan Note (Signed)
I have recommended complete cessation of tobacco use. I have discussed various options available for assistance with tobacco cessation including over the counter methods (Nicotine gum, patch and lozenges). We also discussed prescription options (Chantix, Nicotine Inhaler / Nasal Spray). The patient is not interested in pursuing any prescription tobacco cessation options at this time.  Start lung screening at age 48.  

## 2022-05-23 NOTE — Assessment & Plan Note (Addendum)
Chronic, ongoing.  A1c 6.6% today, slight trend up but remains below goal.  Urine ALB 30 today (January 2024).  Will continue Metformin XR 500 MG daily and adjust as needed.  Recommend she monitor BS at home every morning, fasting, and document for provider + bring to visits.  Focus on diabetic diet.   - Eye and foot exam up to date - Statin on board - Needs ARB low dose in future if can tolerate, low dose ACE caused hypotension - Vaccines up to date.

## 2022-05-23 NOTE — Assessment & Plan Note (Signed)
To left gluteal fold, has drained but remains red.  Will send in Doxycycline 100 MG BID for 7 days and Mupirocin to apply to site.  Fluconazole for yeast if presents with abx therapy.  Recommend if worsening or ongoing then return to office.

## 2022-05-23 NOTE — Assessment & Plan Note (Signed)
Chronic, ongoing.  Continue collaboration with neurology and current regimen as prescribed by them. 
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
## 2022-05-23 NOTE — Assessment & Plan Note (Signed)
Continues to remain sustained from cocaine use, recommend continued cessation. 

## 2022-05-23 NOTE — Assessment & Plan Note (Signed)
Ongoing.  Continue Vitamin B12 supplement daily.  Recommend she take this daily, no missing doses.  Check B12.

## 2022-05-24 LAB — COMPREHENSIVE METABOLIC PANEL
ALT: 21 IU/L (ref 0–32)
AST: 19 IU/L (ref 0–40)
Albumin/Globulin Ratio: 2.4 — ABNORMAL HIGH (ref 1.2–2.2)
Albumin: 4 g/dL (ref 3.9–4.9)
Alkaline Phosphatase: 119 IU/L (ref 44–121)
BUN/Creatinine Ratio: 11 (ref 9–23)
BUN: 6 mg/dL (ref 6–24)
Bilirubin Total: <0.2 mg/dL (ref 0.0–1.2)
CO2: 21 mmol/L (ref 20–29)
Calcium: 9.2 mg/dL (ref 8.7–10.2)
Chloride: 104 mmol/L (ref 96–106)
Creatinine, Ser: 0.55 mg/dL — ABNORMAL LOW (ref 0.57–1.00)
Globulin, Total: 1.7 g/dL (ref 1.5–4.5)
Glucose: 156 mg/dL — ABNORMAL HIGH (ref 70–99)
Potassium: 4.1 mmol/L (ref 3.5–5.2)
Sodium: 141 mmol/L (ref 134–144)
Total Protein: 5.7 g/dL — ABNORMAL LOW (ref 6.0–8.5)
eGFR: 113 mL/min/{1.73_m2} (ref >59)

## 2022-05-24 LAB — LIPID PANEL W/O CHOL/HDL RATIO
Cholesterol, Total: 137 mg/dL (ref 100–199)
HDL: 35 mg/dL — ABNORMAL LOW (ref >39)
LDL Chol Calc (NIH): 56 mg/dL (ref 0–99)
Triglycerides: 297 mg/dL — ABNORMAL HIGH (ref 0–149)
VLDL Cholesterol Cal: 46 mg/dL — ABNORMAL HIGH (ref 5–40)

## 2022-05-24 LAB — VITAMIN B12: Vitamin B-12: 1543 pg/mL — ABNORMAL HIGH (ref 232–1245)

## 2022-05-24 NOTE — Progress Notes (Signed)
Contacted via MyChart   Good morning Kamia, your labs have returned: - Kidney function, creatinine and eGFR, remains normal, as is liver function, AST and ALT.  - Cholesterol labs show at goal LDL, triglycerides a little high.  Continue current medication and focus on healthy diet changes. - B12 slightly above goal, recommend change B12 to every other day dosing.  Any questions? Keep being amazing!!  Thank you for allowing me to participate in your care.  I appreciate you. Kindest regards, Wess Baney

## 2022-05-31 ENCOUNTER — Encounter: Payer: Self-pay | Admitting: Nurse Practitioner

## 2022-06-04 ENCOUNTER — Other Ambulatory Visit: Payer: Self-pay | Admitting: Nurse Practitioner

## 2022-06-05 NOTE — Telephone Encounter (Signed)
Requested Prescriptions  Pending Prescriptions Disp Refills   omeprazole (PRILOSEC) 20 MG capsule [Pharmacy Med Name: Omeprazole 20 MG Oral Capsule Delayed Release] 90 capsule 0    Sig: Take 1 capsule by mouth once daily     Gastroenterology: Proton Pump Inhibitors Passed - 06/04/2022  9:31 AM      Passed - Valid encounter within last 12 months    Recent Outpatient Visits           1 week ago Type 2 diabetes mellitus with obesity (HCC)   [place] [facility] [place], [place] T, NP   3 months ago Medicare annual wellness visit, subsequent   [place] [facility] [place], Russell T, NP   6 months ago Pruritus   [place] [facility] [place], [place] T, NP   7 months ago Type 2 diabetes mellitus with obesity (HCC)   [place] [facility] Mecum, Erin E, PA-C   1 year ago Type 2 diabetes mellitus with obesity (HCC)   [place] [facility] [place], Dorie Rank, NP       Future Appointments             In 2 months Cannady, Dorie Rank, NP  [facility]

## 2022-06-28 ENCOUNTER — Other Ambulatory Visit: Payer: Self-pay | Admitting: Nurse Practitioner

## 2022-06-28 MED ORDER — LANCET DEVICE MISC: 1.0000 | Freq: Three times a day (TID) | 4 refills | Status: AC

## 2022-06-28 MED ORDER — BLOOD GLUCOSE TEST VI STRP: 1.0000 | ORAL_STRIP | Freq: Three times a day (TID) | 40 refills | Status: DC

## 2022-06-28 MED ORDER — BLOOD GLUCOSE MONITORING SUPPL DEVI
1.0000 | Freq: Three times a day (TID) | 0 refills | Status: DC
Start: 2022-06-28 — End: 2023-06-26

## 2022-06-28 MED ORDER — LANCETS MISC. MISC: 1.0000 | Freq: Three times a day (TID) | 0 refills | Status: AC

## 2022-06-28 NOTE — Telephone Encounter (Signed)
Copied from CRM [id number]. Topic: General - Other >> Jun 27, 2022  2:35 PM [place] B wrote: Reason for CRM: pt called in states glucose machine isnt working, wants to see about getting another one.

## 2022-07-03 DIAGNOSIS — G2581 Restless legs syndrome: Secondary | ICD-10-CM | POA: Diagnosis not present

## 2022-07-03 DIAGNOSIS — R519 Headache, unspecified: Secondary | ICD-10-CM | POA: Diagnosis not present

## 2022-07-03 DIAGNOSIS — G250 Essential tremor: Secondary | ICD-10-CM | POA: Diagnosis not present

## 2022-08-12 ENCOUNTER — Other Ambulatory Visit: Payer: Self-pay | Admitting: Nurse Practitioner

## 2022-08-12 DIAGNOSIS — F4310 Post-traumatic stress disorder, unspecified: Secondary | ICD-10-CM

## 2022-08-12 DIAGNOSIS — F3162 Bipolar disorder, current episode mixed, moderate: Secondary | ICD-10-CM

## 2022-08-13 NOTE — Telephone Encounter (Signed)
Requested Prescriptions  Pending Prescriptions Disp Refills   traZODone (DESYREL) 100 MG tablet [Pharmacy Med Name: traZODone HCl 100 MG Oral Tablet] 180 tablet 0    Sig: TAKE 2 TABLETS BY MOUTH AT BEDTIME     Psychiatry: Antidepressants - Serotonin Modulator Passed - 08/12/2022  7:43 AM      Passed - Valid encounter within last 6 months    Recent Outpatient Visits           2 months ago Type 2 diabetes mellitus with obesity (HCC)   [place] [facility] [place], Corrie Dandy T, NP   6 months ago Medicare annual wellness visit, subsequent   [place] [facility] [place], [place] T, NP   8 months ago Pruritus   [place] [facility] [place], Marceline T, NP   9 months ago Type 2 diabetes mellitus with obesity (HCC)   [place] [facility] Mecum, Erin E, PA-C   1 year ago Type 2 diabetes mellitus with obesity (HCC)   [place] [facility] [place], Corrie Dandy T, NP       Future Appointments             In 1 week Cannady, Corrie Dandy T, NP [place] [facility]             omeprazole (PRILOSEC) 20 MG capsule [Pharmacy Med Name: Omeprazole 20 MG Oral Capsule Delayed Release] 90 capsule 0    Sig: Take 1 capsule by mouth once daily     Gastroenterology: Proton Pump Inhibitors Passed - 08/12/2022  7:43 AM      Passed - Valid encounter within last 12 months    Recent Outpatient Visits           2 months ago Type 2 diabetes mellitus with obesity (HCC)   [place] [facility] [place], [place] T, NP   6 months ago Medicare annual wellness visit, subsequent   [place] [facility] [place], [place] T, NP   8 months ago Pruritus   [place] [facility] [place], [place] T, NP   9 months ago Type 2 diabetes mellitus with obesity (HCC)   [place] [facility] Mecum, Erin E, PA-C   1 year ago Type 2 diabetes mellitus with obesity (HCC)   [place]  [facility] [place], [place] T, NP       Future Appointments             In 1 week Cannady, Dorie Rank, NP [place] [facility]             sertraline (ZOLOFT) 50 MG tablet [Pharmacy Med Name: Sertraline HCl 50 MG Oral Tablet] 90 tablet 0    Sig: Take 1 tablet by mouth once daily     Psychiatry:  Antidepressants - SSRI - sertraline Passed - 08/12/2022  7:43 AM      Passed - AST in normal range and within 360 days    AST  Date Value Ref Range Status  05/23/2022 19 0 - 40 IU/L Final   SGOT(AST)  Date Value Ref Range Status  06/10/2013 102 (H) 15 - 37 Unit/L Final         Passed - ALT in normal range and within 360 days    ALT  Date Value Ref Range Status  05/23/2022 21 0 - 32 IU/L Final   SGPT (ALT)  Date Value Ref Range Status  06/10/2013 59 12 -  78 U/L Final         Passed - Completed PHQ-2 or PHQ-9 in the last 360 days      Passed - Valid encounter within last 6 months    Recent Outpatient Visits           2 months ago Type 2 diabetes mellitus with obesity (HCC)   [place] [facility] [place], Corrie Dandy T, NP   6 months ago Medicare annual wellness visit, subsequent   [place] [facility] [place], [place] T, NP   8 months ago Pruritus   [place] [facility] [place], Aldrich T, NP   9 months ago Type 2 diabetes mellitus with obesity (HCC)   [place] [facility] Mecum, Erin E, PA-C   1 year ago Type 2 diabetes mellitus with obesity (HCC)   [place] [facility] [place], Dorie Rank, NP       Future Appointments             In 1 week Cannady, Dorie Rank, NP  [facility] [place]

## 2022-08-19 ENCOUNTER — Other Ambulatory Visit: Payer: Self-pay | Admitting: Nurse Practitioner

## 2022-08-19 DIAGNOSIS — F3162 Bipolar disorder, current episode mixed, moderate: Secondary | ICD-10-CM

## 2022-08-19 DIAGNOSIS — F4310 Post-traumatic stress disorder, unspecified: Secondary | ICD-10-CM

## 2022-08-19 NOTE — Patient Instructions (Addendum)
[facility] -- psychiatry call  [phone]  Diabetes Mellitus Basics  Diabetes mellitus, or diabetes, is a long-term (chronic) disease. It occurs when the body does not properly use sugar (glucose) that is released from food after you eat. Diabetes mellitus may be caused by one or both of these problems: Your pancreas does not make enough of a hormone called insulin. Your body does not react in a normal way to the insulin that it makes. Insulin lets glucose enter cells in your body. This gives you energy. If you have diabetes, glucose cannot get into cells. This causes high blood glucose (hyperglycemia). How to treat and manage diabetes You may need to take insulin or other diabetes medicines daily to keep your glucose in balance. If you are prescribed insulin, you will learn how to give yourself insulin by injection. You may need to adjust the amount of insulin you take based on the foods that you eat. You will need to check your blood glucose levels using a glucose monitor as told by your health care provider. The readings can help determine if you have low or high blood glucose. Generally, you should have these blood glucose levels: Before meals (preprandial): 80-130 mg/dL (0.9-8.1 mmol/L). After meals (postprandial): below 180 mg/dL (10 mmol/L). Hemoglobin A1c (HbA1c) level: less than 7%. Your health care provider will set treatment goals for you. Keep all follow-up visits. This is important. Follow these instructions at home: Diabetes medicines Take your diabetes medicines every day as told by your health care provider. List your diabetes medicines here: Name of medicine: ______________________________ Amount (dose): _______________ Time (a.m./p.m.): _______________ Notes: ___________________________________ Name of medicine: ______________________________ Amount (dose): _______________ Time (a.m./p.m.): _______________ Notes: ___________________________________ Name of medicine:  ______________________________ Amount (dose): _______________ Time (a.m./p.m.): _______________ Notes: ___________________________________ Insulin If you use insulin, list the types of insulin you use here: Insulin type: ______________________________ Amount (dose): _______________ Time (a.m./p.m.): _______________Notes: ___________________________________ Insulin type: ______________________________ Amount (dose): _______________ Time (a.m./p.m.): _______________ Notes: ___________________________________ Insulin type: ______________________________ Amount (dose): _______________ Time (a.m./p.m.): _______________ Notes: ___________________________________ Insulin type: ______________________________ Amount (dose): _______________ Time (a.m./p.m.): _______________ Notes: ___________________________________ Insulin type: ______________________________ Amount (dose): _______________ Time (a.m./p.m.): _______________ Notes: ___________________________________ Managing blood glucose  Check your blood glucose levels using a glucose monitor as told by your health care provider. Write down the times that you check your glucose levels here: Time: _______________ Notes: ___________________________________ Time: _______________ Notes: ___________________________________ Time: _______________ Notes: ___________________________________ Time: _______________ Notes: ___________________________________ Time: _______________ Notes: ___________________________________ Time: _______________ Notes: ___________________________________  Low blood glucose Low blood glucose (hypoglycemia) is when glucose is at or below 70 mg/dL (3.9 mmol/L). Symptoms may include: Feeling: Hungry. Sweaty and clammy. Irritable or easily upset. Dizzy. Sleepy. Having: A fast heartbeat. A headache. A change in your vision. Numbness around the mouth, lips, or tongue. Having trouble with: Moving  (coordination). Sleeping. Treating low blood glucose To treat low blood glucose, eat or drink something containing sugar right away. If you can think clearly and swallow safely, follow the 15:15 rule: Take 15 grams of a fast-acting carb (carbohydrate), as told by your health care provider. Some fast-acting carbs are: Glucose tablets: take 3-4 tablets. Hard candy: eat 3-5 pieces. Fruit juice: drink 4 oz (120 mL). Regular (not diet) soda: drink 4-6 oz (120-180 mL). Honey or sugar: eat 1 Tbsp (15 mL). Check your blood glucose levels 15 minutes after you take the carb. If your glucose is still at or below 70 mg/dL (3.9 mmol/L), take 15 grams of a carb again. If your glucose  does not go above 70 mg/dL (3.9 mmol/L) after 3 tries, get help right away. After your glucose goes back to normal, eat a meal or a snack within 1 hour. Treating very low blood glucose If your glucose is at or below 54 mg/dL (3 mmol/L), you have very low blood glucose (severe hypoglycemia). This is an emergency. Do not wait to see if the symptoms will go away. Get medical help right away. Call your local emergency services (911 in the [place] Do not drive yourself to the hospital. Questions to ask your health care provider Should I talk with a diabetes educator? What equipment will I need to care for myself at home? What diabetes medicines do I need? When should I take them? How often do I need to check my blood glucose levels? What number can I call if I have questions? When is my follow-up visit? Where can I find a support group for people with diabetes? Where to find more information American Diabetes Association: www.diabetes.org Association of Diabetes Care and Education Specialists: www.diabeteseducator.org Contact a health care provider if: Your blood glucose is at or above 240 mg/dL (40.9 mmol/L) for 2 days in a row. You have been sick or have had a fever for 2 days or more, and you are not getting better. You  have any of these problems for more than 6 hours: You cannot eat or drink. You feel nauseous. You vomit. You have diarrhea. Get help right away if: Your blood glucose is lower than 54 mg/dL (3 mmol/L). You get confused. You have trouble thinking clearly. You have trouble breathing. These symptoms may represent a serious problem that is an emergency. Do not wait to see if the symptoms will go away. Get medical help right away. Call your local emergency services (911 in the [place] Do not drive yourself to the hospital. Summary Diabetes mellitus is a chronic disease that occurs when the body does not properly use sugar (glucose) that is released from food after you eat. Take insulin and diabetes medicines as told. Check your blood glucose every day, as often as told. Keep all follow-up visits. This is important. This information is not intended to replace advice given to you by your health care provider. Make sure you discuss any questions you have with your health care provider. Document Revised: 05/26/2019 Document Reviewed: 05/26/2019 Elsevier Patient Education  2024 [employer].

## 2022-08-20 NOTE — Telephone Encounter (Signed)
Requested Prescriptions  Refused Prescriptions Disp Refills   sertraline (ZOLOFT) 50 MG tablet [Pharmacy Med Name: Sertraline HCl 50 MG Oral Tablet] 90 tablet 0    Sig: Take 1 tablet by mouth once daily     Psychiatry:  Antidepressants - SSRI - sertraline Passed - 08/19/2022  1:27 AM      Passed - AST in normal range and within 360 days    AST  Date Value Ref Range Status  05/23/2022 19 0 - 40 IU/L Final   SGOT(AST)  Date Value Ref Range Status  06/10/2013 102 (H) 15 - 37 Unit/L Final         Passed - ALT in normal range and within 360 days    ALT  Date Value Ref Range Status  05/23/2022 21 0 - 32 IU/L Final   SGPT (ALT)  Date Value Ref Range Status  06/10/2013 59 12 - 78 U/L Final         Passed - Completed PHQ-2 or PHQ-9 in the last 360 days      Passed - Valid encounter within last 6 months    Recent Outpatient Visits           2 months ago Type 2 diabetes mellitus with obesity (HCC)   [place] [facility] [place], Corrie Dandy T, NP   6 months ago Medicare annual wellness visit, subsequent   [place] [facility] [place], [place] T, NP   9 months ago Pruritus   [place] [facility] [place], [place] T, NP   9 months ago Type 2 diabetes mellitus with obesity (HCC)   [place] [facility] Mecum, Erin E, PA-C   1 year ago Type 2 diabetes mellitus with obesity (HCC)   [place] [facility] [place], Dorie Rank, NP       Future Appointments             In 2 days Cannady, Dorie Rank, NP [place] [facility], [facility]             traZODone (DESYREL) 100 MG tablet [Pharmacy Med Name: traZODone HCl 100 MG Oral Tablet] 180 tablet 0    Sig: TAKE 2 TABLETS BY MOUTH AT BEDTIME     Psychiatry: Antidepressants - Serotonin Modulator Passed - 08/19/2022  1:27 AM      Passed - Valid encounter within last 6 months    Recent Outpatient Visits           2 months ago Type 2 diabetes mellitus with  obesity (HCC)   [place] [facility] [place], [place] T, NP   6 months ago Medicare annual wellness visit, subsequent   [place] [facility] [place], [place] T, NP   9 months ago Pruritus   [place] [facility] [place], [place] T, NP   9 months ago Type 2 diabetes mellitus with obesity (HCC)   [place] [facility] Mecum, Erin E, PA-C   1 year ago Type 2 diabetes mellitus with obesity (HCC)   [place] [facility] [place], Dorie Rank, NP       Future Appointments             In 2 days Cannady, Dorie Rank, NP  [facility], [facility]

## 2022-08-22 ENCOUNTER — Encounter: Payer: Self-pay | Admitting: Nurse Practitioner

## 2022-08-22 ENCOUNTER — Ambulatory Visit (INDEPENDENT_AMBULATORY_CARE_PROVIDER_SITE_OTHER): Payer: 59 | Admitting: Nurse Practitioner

## 2022-08-22 ENCOUNTER — Telehealth: Payer: Self-pay | Admitting: Nurse Practitioner

## 2022-08-22 VITALS — BP 118/78 | HR 90 | Temp 99.0°F | Ht 62.01 in | Wt 175.0 lb

## 2022-08-22 DIAGNOSIS — J4540 Moderate persistent asthma, uncomplicated: Secondary | ICD-10-CM

## 2022-08-22 DIAGNOSIS — G2581 Restless legs syndrome: Secondary | ICD-10-CM | POA: Diagnosis not present

## 2022-08-22 DIAGNOSIS — E1169 Type 2 diabetes mellitus with other specified complication: Secondary | ICD-10-CM

## 2022-08-22 DIAGNOSIS — F3162 Bipolar disorder, current episode mixed, moderate: Secondary | ICD-10-CM | POA: Diagnosis not present

## 2022-08-22 DIAGNOSIS — E1149 Type 2 diabetes mellitus with other diabetic neurological complication: Secondary | ICD-10-CM | POA: Diagnosis not present

## 2022-08-22 DIAGNOSIS — F17210 Nicotine dependence, cigarettes, uncomplicated: Secondary | ICD-10-CM | POA: Diagnosis not present

## 2022-08-22 DIAGNOSIS — E669 Obesity, unspecified: Secondary | ICD-10-CM

## 2022-08-22 DIAGNOSIS — I152 Hypertension secondary to endocrine disorders: Secondary | ICD-10-CM | POA: Diagnosis not present

## 2022-08-22 DIAGNOSIS — F5101 Primary insomnia: Secondary | ICD-10-CM

## 2022-08-22 DIAGNOSIS — G2111 Neuroleptic induced parkinsonism: Secondary | ICD-10-CM | POA: Diagnosis not present

## 2022-08-22 DIAGNOSIS — F1421 Cocaine dependence, in remission: Secondary | ICD-10-CM

## 2022-08-22 DIAGNOSIS — E785 Hyperlipidemia, unspecified: Secondary | ICD-10-CM | POA: Diagnosis not present

## 2022-08-22 DIAGNOSIS — F1011 Alcohol abuse, in remission: Secondary | ICD-10-CM

## 2022-08-22 DIAGNOSIS — E1159 Type 2 diabetes mellitus with other circulatory complications: Secondary | ICD-10-CM

## 2022-08-22 LAB — BAYER DCA HB A1C WAIVED: HB A1C (BAYER DCA - WAIVED): 6.9 % — ABNORMAL HIGH (ref 4.8–5.6)

## 2022-08-22 MED ORDER — AMOXICILLIN-POT CLAVULANATE 875-125 MG PO TABS: 1.0000 | ORAL_TABLET | Freq: Two times a day (BID) | ORAL | 0 refills | Status: AC

## 2022-08-22 MED ORDER — OMEPRAZOLE 20 MG PO CPDR: 20.0000 mg | DELAYED_RELEASE_CAPSULE | Freq: Every day | ORAL | 4 refills | Status: DC

## 2022-08-22 MED ORDER — LIDOCAINE 5 % EX PTCH: 1.0000 | MEDICATED_PATCH | CUTANEOUS | 4 refills | Status: AC

## 2022-08-22 MED ORDER — FLUTICASONE FUROATE-VILANTEROL 100-25 MCG/ACT IN AEPB: 1.0000 | INHALATION_SPRAY | Freq: Every day | RESPIRATORY_TRACT | 4 refills | Status: DC

## 2022-08-22 MED ORDER — PREDNISONE 20 MG PO TABS: 40.0000 mg | ORAL_TABLET | Freq: Every day | ORAL | 0 refills | Status: AC

## 2022-08-22 NOTE — Assessment & Plan Note (Signed)
Remains alcohol free at this time, recommend continued cessation.

## 2022-08-22 NOTE — Assessment & Plan Note (Signed)
Chronic, stable.  Continue collaboration with neurology as needed.  Continue current medication regimen.  Recent note and medication changes reviewed.
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
## 2022-08-22 NOTE — Assessment & Plan Note (Signed)
I have recommended complete cessation of tobacco use. I have discussed various options available for assistance with tobacco cessation including over the counter methods (Nicotine gum, patch and lozenges). We also discussed prescription options (Chantix, Nicotine Inhaler / Nasal Spray). The patient is not interested in pursuing any prescription tobacco cessation options at this time.  Start lung screening at age 48.  

## 2022-08-22 NOTE — Telephone Encounter (Signed)
Pt is calling in because she saw that she was prescribed  amoxicillin and wants to know if Jolene could call her in something that prevents her from getting a yeast infection. Per pt when she takes amoxicillin  she usually gets a pill to help prevent yeast infections. Please advise.

## 2022-08-22 NOTE — Assessment & Plan Note (Signed)
Chronic, ongoing.  Continue collaboration with neurology and current regimen as prescribed by them. Recent notes reviewed and medication changes.  She reports she is not taking Klonopin, discussed with her and recommend she not take due to long term risks.

## 2022-08-22 NOTE — Assessment & Plan Note (Signed)
 Chronic, ongoing.  Continue Atorvastatin 40 MG daily and adjust as needed, refills sent.  Lipid panel today.

## 2022-08-22 NOTE — Assessment & Plan Note (Signed)
Chronic, ongoing.  A1c 6.9% today, slight trend up but remains below goal.  Urine ALB 30 (January 2024).  Will continue Metformin XR 500 MG daily and adjust as needed.  Recommend Vanessa Baker monitor BS at home every morning, fasting, and document for provider + bring to visits.  Focus on diabetic diet.   - Eye and foot exam up to date - Statin on board - Needs ARB low dose in future if can tolerate, low dose ACE caused hypotension - Vaccines up to date.

## 2022-08-22 NOTE — Assessment & Plan Note (Signed)
Chronic, stable.  BP at goal.  Will remain off Lisinopril for now, consider low dose ARB in future for kidney protection if needed + suspect underlying COPD -- avoid hypotension.  Urine ALB 30 (January 2024).  A1c 6.9% today.  Recommend she monitor BP at home daily and document + bring to visits.  Focus on DASH diet.  LABS: CMP.  Referral for sleep study placed last visit, has not attended.

## 2022-08-22 NOTE — Assessment & Plan Note (Addendum)
Chronic, exacerbated.  Will start Augmentin BID for 7 days and Prednisone 40 MG daily for 5 days.  Recommend cessation of smoking.  Monitor closely for return to tobacco use.  Will continue Breo and Albuterol. Spirometry next visit.  Start lung CT screening at age 47.  Would benefit from no ACE in future and switch to ARB due to suspected COPD.  Sent in Lidocaine patches for back pain with her increase coughing.  Return to office if worsening or ongoing over next week.

## 2022-08-22 NOTE — Assessment & Plan Note (Signed)
Refer to diabetes with obesity plan of care.  Continue collaboration with neurology and current Gabapentin dosing. 
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
## 2022-08-22 NOTE — Assessment & Plan Note (Signed)
Chronic, stable with Trazodone.  Continue current medication regimen and adjust as needed.  Sleep study referral placed last visit and has not attended.

## 2022-08-22 NOTE — Assessment & Plan Note (Signed)
 Continues to remain sustained from cocaine use, recommend continued cessation.
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
## 2022-08-23 LAB — LIPID PANEL W/O CHOL/HDL RATIO
Cholesterol, Total: 148 mg/dL (ref 100–199)
HDL: 38 mg/dL — ABNORMAL LOW (ref >39)
LDL Chol Calc (NIH): 66 mg/dL (ref 0–99)
Triglycerides: 275 mg/dL — ABNORMAL HIGH (ref 0–149)
VLDL Cholesterol Cal: 44 mg/dL — ABNORMAL HIGH (ref 5–40)

## 2022-08-23 LAB — COMPREHENSIVE METABOLIC PANEL
ALT: 16 IU/L (ref 0–32)
AST: 11 IU/L (ref 0–40)
Albumin: 4 g/dL (ref 3.9–4.9)
Alkaline Phosphatase: 92 IU/L (ref 44–121)
BUN/Creatinine Ratio: 23 (ref 9–23)
BUN: 14 mg/dL (ref 6–24)
Bilirubin Total: <0.2 mg/dL (ref 0.0–1.2)
CO2: 24 mmol/L (ref 20–29)
Calcium: 9.4 mg/dL (ref 8.7–10.2)
Chloride: 102 mmol/L (ref 96–106)
Creatinine, Ser: 0.61 mg/dL (ref 0.57–1.00)
Globulin, Total: 2.1 g/dL (ref 1.5–4.5)
Glucose: 196 mg/dL — ABNORMAL HIGH (ref 70–99)
Potassium: 4.3 mmol/L (ref 3.5–5.2)
Sodium: 140 mmol/L (ref 134–144)
Total Protein: 6.1 g/dL (ref 6.0–8.5)
eGFR: 110 mL/min/{1.73_m2} (ref >59)

## 2022-08-23 MED ORDER — FLUCONAZOLE 150 MG PO TABS: 150.0000 mg | ORAL_TABLET | Freq: Once | ORAL | 0 refills | Status: AC

## 2022-08-23 NOTE — Progress Notes (Signed)
Contacted via MyChart   Good morning Addelyn, your labs have returned: - Kidney function, creatinine and eGFR, remains normal, as is liver function, AST and ALT.  - Cholesterol labs show stable LDL (bad cholesterol), but triglycerides remain a little elevated.  Are you taking Atorvastatin daily, we may need to adjust next visit or add on another medicine if ongoing elevations.  Any questions? Keep being amazing!!  Thank you for allowing me to participate in your care.  I appreciate you. Kindest regards, Ruvi Fullenwider

## 2022-09-24 ENCOUNTER — Other Ambulatory Visit: Payer: Self-pay | Admitting: Nurse Practitioner

## 2022-09-25 NOTE — Telephone Encounter (Signed)
Requested Prescriptions  Pending Prescriptions Disp Refills   albuterol (VENTOLIN HFA) 108 (90 Base) MCG/ACT inhaler [Pharmacy Med Name: Albuterol Sulfate HFA 108 (90 Base) MCG/ACT Inhalation Aerosol Solution] 18 g 0    Sig: INHALE 2 PUFFS BY MOUTH EVERY 6 HOURS AS NEEDED FOR WHEEZING AND FOR SHORTNESS OF BREATH     Pulmonology:  Beta Agonists 2 Passed - 09/24/2022 10:21 AM      Passed - Last BP in normal range    BP Readings from Last 1 Encounters:  08/22/22 118/78         Passed - Last Heart Rate in normal range    Pulse Readings from Last 1 Encounters:  08/22/22 90         Passed - Valid encounter within last 12 months    Recent Outpatient Visits           1 month ago Type 2 diabetes mellitus with obesity (HCC)   [place] [facility] [place], Adin T, NP   4 months ago Type 2 diabetes mellitus with obesity (HCC)   [place] [facility] [place], Corrie Dandy T, NP   7 months ago Medicare annual wellness visit, subsequent   [place] [facility] [place], [place] T, NP   10 months ago Pruritus   [place] [facility] [place], [place] T, NP   10 months ago Type 2 diabetes mellitus with obesity (HCC)   [place] [facility] Mecum, Oswaldo Conroy, PA-C       Future Appointments             In 1 month Cannady, Dorie Rank, NP [place] [facility], [facility]

## 2022-10-05 ENCOUNTER — Other Ambulatory Visit: Payer: Self-pay | Admitting: Nurse Practitioner

## 2022-10-05 NOTE — Telephone Encounter (Signed)
Requested Prescriptions  Refused Prescriptions Disp Refills   albuterol (VENTOLIN HFA) 108 (90 Base) MCG/ACT inhaler [Pharmacy Med Name: Albuterol Sulfate HFA 108 (90 Base) MCG/ACT Inhalation Aerosol Solution] 18 g 0    Sig: INHALE 2 PUFFS BY MOUTH EVERY 6 HOURS AS NEEDED FOR WHEEZING AND FOR SHORTNESS OF BREATH     Pulmonology:  Beta Agonists 2 Passed - 10/05/2022  6:43 AM      Passed - Last BP in normal range    BP Readings from Last 1 Encounters:  08/22/22 118/78         Passed - Last Heart Rate in normal range    Pulse Readings from Last 1 Encounters:  08/22/22 90         Passed - Valid encounter within last 12 months    Recent Outpatient Visits           1 month ago Type 2 diabetes mellitus with obesity (HCC)   [place] [facility] [place], Jeannette T, NP   4 months ago Type 2 diabetes mellitus with obesity (HCC)   [place] [facility] [place], Corrie Dandy T, NP   7 months ago Medicare annual wellness visit, subsequent   [place] [facility] [place], [place] T, NP   10 months ago Pruritus   [place] [facility] [place], [place] T, NP   11 months ago Type 2 diabetes mellitus with obesity (HCC)   [place] [facility] [place], Oswaldo Conroy, PA-C       Future Appointments             In 1 month Cannady, Dorie Rank, NP [place] [facility]

## 2022-10-21 ENCOUNTER — Other Ambulatory Visit: Payer: Self-pay | Admitting: Nurse Practitioner

## 2022-10-22 NOTE — Telephone Encounter (Signed)
Requested Prescriptions  Pending Prescriptions Disp Refills   VENTOLIN HFA 108 (90 Base) MCG/ACT inhaler [Pharmacy Med Name: Ventolin HFA 108 (90 Base) MCG/ACT Inhalation Aerosol Solution] 18 g 0    Sig: INHALE 2 PUFFS BY MOUTH EVERY 6 HOURS AS NEEDED FOR WHEEZING AND FOR SHORTNESS OF BREATH     Pulmonology:  Beta Agonists 2 Passed - 10/21/2022  7:43 AM      Passed - Last BP in normal range    BP Readings from Last 1 Encounters:  08/22/22 118/78         Passed - Last Heart Rate in normal range    Pulse Readings from Last 1 Encounters:  08/22/22 90         Passed - Valid encounter within last 12 months    Recent Outpatient Visits           2 months ago Type 2 diabetes mellitus with obesity (HCC)   [place] [facility] [place], [place] T, NP   5 months ago Type 2 diabetes mellitus with obesity (HCC)   [place] [facility] [place], Corrie Dandy T, NP   8 months ago Medicare annual wellness visit, subsequent   [place], [place] T, NP   11 months ago Pruritus   [facility] [place], [place] T, NP   11 months ago Type 2 diabetes mellitus with obesity (HCC)   [place] [facility] Mecum, Oswaldo Conroy, PA-C       Future Appointments             In 1 month Cannady, Dorie Rank, NP  [facility]

## 2022-10-25 DIAGNOSIS — M25551 Pain in right hip: Secondary | ICD-10-CM | POA: Diagnosis not present

## 2022-10-25 DIAGNOSIS — G251 Drug-induced tremor: Secondary | ICD-10-CM | POA: Diagnosis not present

## 2022-10-25 DIAGNOSIS — G479 Sleep disorder, unspecified: Secondary | ICD-10-CM | POA: Diagnosis not present

## 2022-10-25 DIAGNOSIS — G2581 Restless legs syndrome: Secondary | ICD-10-CM | POA: Diagnosis not present

## 2022-10-25 DIAGNOSIS — G250 Essential tremor: Secondary | ICD-10-CM | POA: Diagnosis not present

## 2022-10-25 DIAGNOSIS — R519 Headache, unspecified: Secondary | ICD-10-CM | POA: Diagnosis not present

## 2022-10-25 DIAGNOSIS — M25552 Pain in left hip: Secondary | ICD-10-CM | POA: Diagnosis not present

## 2022-10-30 ENCOUNTER — Other Ambulatory Visit: Payer: Self-pay | Admitting: Nurse Practitioner

## 2022-10-30 NOTE — Telephone Encounter (Signed)
Requested Prescriptions  Pending Prescriptions Disp Refills   rOPINIRole (REQUIP) 2 MG tablet [Pharmacy Med Name: rOPINIRole HCl 2 MG Oral Tablet] 30 tablet 0    Sig: TAKE 1 TABLET BY MOUTH AT BEDTIME     Neurology:  Parkinsonian Agents Passed - 10/30/2022  6:43 AM      Passed - Last BP in normal range    BP Readings from Last 1 Encounters:  08/22/22 118/78         Passed - Last Heart Rate in normal range    Pulse Readings from Last 1 Encounters:  08/22/22 90         Passed - Valid encounter within last 12 months    Recent Outpatient Visits           2 months ago Type 2 diabetes mellitus with obesity (HCC)   [place] [facility] [place], [place] T, NP   5 months ago Type 2 diabetes mellitus with obesity (HCC)   [place] [facility] [place], Corrie Dandy T, NP   8 months ago Medicare annual wellness visit, subsequent   [place] [facility] [place], [place] T, NP   11 months ago Pruritus   [place] [facility] [place], [place] T, NP   11 months ago Type 2 diabetes mellitus with obesity (HCC)   [place] [facility] Mecum, Oswaldo Conroy, PA-C       Future Appointments             In 3 weeks Cannady, Dorie Rank, NP [place] [facility]

## 2022-11-13 ENCOUNTER — Other Ambulatory Visit: Payer: Self-pay | Admitting: Nurse Practitioner

## 2022-11-13 NOTE — Telephone Encounter (Signed)
Requested Prescriptions  Pending Prescriptions Disp Refills   VENTOLIN HFA 108 (90 Base) MCG/ACT inhaler [Pharmacy Med Name: Ventolin HFA 108 (90 Base) MCG/ACT Inhalation Aerosol Solution] 18 g 0    Sig: INHALE 2 PUFFS BY MOUTH EVERY 6 HOURS AS NEEDED FOR WHEEZING AND FOR SHORTNESS OF BREATH     Pulmonology:  Beta Agonists 2 Passed - 11/13/2022  6:44 AM      Passed - Last BP in normal range    BP Readings from Last 1 Encounters:  08/22/22 118/78         Passed - Last Heart Rate in normal range    Pulse Readings from Last 1 Encounters:  08/22/22 90         Passed - Valid encounter within last 12 months    Recent Outpatient Visits           2 months ago Type 2 diabetes mellitus with obesity (HCC)   [place] [facility] [place], [place] T, NP   5 months ago Type 2 diabetes mellitus with obesity (HCC)   [place] [facility] [place], Dorie Rank, NP   9 months ago Medicare annual wellness visit, subsequent   [place] [facility] [place], [place] T, NP   11 months ago Pruritus   [place] [facility] [place], [place] T, NP   1 year ago Type 2 diabetes mellitus with obesity (HCC)   [place] [facility] Mecum, Oswaldo Conroy, PA-C       Future Appointments             In 1 week Cannady, Dorie Rank, NP [place] [facility]

## 2022-11-14 ENCOUNTER — Other Ambulatory Visit: Payer: Self-pay | Admitting: Nurse Practitioner

## 2022-11-14 DIAGNOSIS — F3162 Bipolar disorder, current episode mixed, moderate: Secondary | ICD-10-CM

## 2022-11-14 DIAGNOSIS — F4310 Post-traumatic stress disorder, unspecified: Secondary | ICD-10-CM

## 2022-11-14 NOTE — Telephone Encounter (Signed)
Requested Prescriptions  Pending Prescriptions Disp Refills   sertraline (ZOLOFT) 50 MG tablet [Pharmacy Med Name: Sertraline HCl 50 MG Oral Tablet] 90 tablet 0    Sig: Take 1 tablet by mouth once daily     Psychiatry:  Antidepressants - SSRI - sertraline Passed - 11/14/2022  6:44 AM      Passed - AST in normal range and within 360 days    AST  Date Value Ref Range Status  08/22/2022 11 0 - 40 IU/L Final   SGOT(AST)  Date Value Ref Range Status  06/10/2013 102 (H) 15 - 37 Unit/L Final         Passed - ALT in normal range and within 360 days    ALT  Date Value Ref Range Status  08/22/2022 16 0 - 32 IU/L Final   SGPT (ALT)  Date Value Ref Range Status  06/10/2013 59 12 - 78 U/L Final         Passed - Completed PHQ-2 or PHQ-9 in the last 360 days      Passed - Valid encounter within last 6 months    Recent Outpatient Visits           2 months ago Type 2 diabetes mellitus with obesity (HCC)   [place] [facility] [place], [place] T, NP   5 months ago Type 2 diabetes mellitus with obesity (HCC)   [place] [facility] [place], Dorie Rank, NP   9 months ago Medicare annual wellness visit, subsequent   [place] [facility] [place], [place] T, NP   11 months ago Pruritus   [place] [facility] [place], [place] T, NP   1 year ago Type 2 diabetes mellitus with obesity (HCC)   [place] [facility] Mecum, Oswaldo Conroy, PA-C       Future Appointments             In 1 week Cannady, Dorie Rank, NP [place] [facility]

## 2022-11-18 NOTE — Patient Instructions (Signed)
Diabetes Mellitus and Skin Care Diabetes, also called diabetes mellitus, can lead to skin problems. If blood sugar (glucose) is not well controlled, it can cause problems over time. These problems include: Damage to nerves. This can affect your ability to feel wounds. This means you may not notice small skin injuries that could lead to bigger problems. This can also decrease the amount that you sweat, causing dry skin. Damage to blood vessels. The lack of blood flow can cause skin to break down. It can also slow healing time, which can lead to infections. Areas of skin that become thick or discolored. Common skin conditions There are certain skin conditions that often affect people with diabetes. These include: Dry skin. Thin skin. The skin on the feet may get thinner, break more easily, and heal more slowly than normal. Skin infections from bacteria. These include: Styes. These are infections near the eyelid. Boils. These are bumps filled with pus. Infected hair follicles. Infections of the skin around the nails. Fungal skin infections. These are most common in areas where skin rubs together, such as in the armpits or under the breasts. Common skin changes Diabetes can also cause the skin to change. You may develop: Dark, velvety markings on your skin. These may appear on your face, neck, armpits, inner thighs, and groin. Red, raised, scar-like tissue that may itch, feel painful, or become a wound. Blisters on your feet, toes, hands, or fingers. Thick, wax-like areas of skin. In most cases, these occur on the hands, forehead, or toes. Brown or red, ring-shaped or half-ring-shaped patches of skin on the ears or fingers. Pea-shaped, yellow bumps that may be itchy and have a red ring around them. This may affect your arms, feet, buttocks, and the top of your hands. Round, discolored patches of tan skin that do not hurt or itch. These may look like age spots. Supplies needed: Mild soap or  gentle skin cleanser. Lotion. How to care for dry, itchy skin Frequent high glucose levels can cause skin to become itchy. Poor blood circulation and skin infections can make dry skin worse. If you have dry, itchy skin: Avoid very hot showers and baths. Use mild soap and gentle skin cleansers. Do not use soap that is perfumed, harsh, or that dries your skin. Moisturizing soaps may help. Put on moisturizing lotion as soon as you finish bathing. Do not scratch dry skin. Scratching can expose skin to infection. If you have a rash or if your skin is very itchy, contact your health care provider. Skin that is red or covered in a rash may be a sign of an allergic reaction. Very itchy skin may mean that you need help to manage your diabetes better. You may also need treatment for an infection. General tips Most skin problems can be prevented or treated easily if caught early. Talk with your health care provider if you have any concerns. General tips include: Check your skin every day for cuts, bruises, redness, blisters, or sores, especially on your feet. If you cannot see the bottom of your feet, use a mirror or ask someone for help. Tell your health care provider if you have any of these injuries and if they are healing slowly. Keep your skin clean and dry. Do not use hot water. Moisturize your skin to prevent chapping. Keep your blood glucose levels within target range. Follow these instructions at home:  Take over-the-counter and prescription medicines only as told by your health care provider. This includes all diabetes medicines  you are taking. Schedule a foot exam with your health care provider once a year. During the exam, the structure and skin of your feet will be checked for problems. Make sure that your health care provider does a visual foot exam at every visit. If you get a skin injury, such as a cut, blister, or sore, check the area every day for signs of infection. Check for: Redness,  swelling, or pain. Fluid or blood. Warmth. Pus or a bad smell. Do not use any products that contain nicotine or tobacco. These products include cigarettes, chewing tobacco, and vaping devices, such as e-cigarettes. If you need help quitting, ask your health care provider. Where to find more information American Diabetes Association: diabetes.org Association of Diabetes Care & Education Specialists: diabeteseducator.org Contact a health care provider if: You get a cut or sore, especially on your feet. You have signs of infection after a skin injury. You have itchy skin that turns red or develops a rash. You have discolored areas of skin. You have places on your skin that change. They may thicken or appear shiny. This information is not intended to replace advice given to you by your health care provider. Make sure you discuss any questions you have with your health care provider. Document Revised: 07/26/2021 Document Reviewed: 07/26/2021 Elsevier Patient Education  2024 [employer].

## 2022-11-23 ENCOUNTER — Encounter: Payer: Self-pay | Admitting: Nurse Practitioner

## 2022-11-23 ENCOUNTER — Ambulatory Visit (INDEPENDENT_AMBULATORY_CARE_PROVIDER_SITE_OTHER): Payer: 59 | Admitting: Nurse Practitioner

## 2022-11-23 VITALS — BP 109/64 | HR 98 | Temp 98.6°F | Ht 62.0 in | Wt 168.8 lb

## 2022-11-23 DIAGNOSIS — G2581 Restless legs syndrome: Secondary | ICD-10-CM | POA: Diagnosis not present

## 2022-11-23 DIAGNOSIS — E1159 Type 2 diabetes mellitus with other circulatory complications: Secondary | ICD-10-CM | POA: Diagnosis not present

## 2022-11-23 DIAGNOSIS — E1149 Type 2 diabetes mellitus with other diabetic neurological complication: Secondary | ICD-10-CM | POA: Diagnosis not present

## 2022-11-23 DIAGNOSIS — T43505A Adverse effect of unspecified antipsychotics and neuroleptics, initial encounter: Secondary | ICD-10-CM | POA: Diagnosis not present

## 2022-11-23 DIAGNOSIS — E1169 Type 2 diabetes mellitus with other specified complication: Secondary | ICD-10-CM

## 2022-11-23 DIAGNOSIS — E669 Obesity, unspecified: Secondary | ICD-10-CM

## 2022-11-23 DIAGNOSIS — E785 Hyperlipidemia, unspecified: Secondary | ICD-10-CM

## 2022-11-23 DIAGNOSIS — Z23 Encounter for immunization: Secondary | ICD-10-CM

## 2022-11-23 DIAGNOSIS — J4540 Moderate persistent asthma, uncomplicated: Secondary | ICD-10-CM

## 2022-11-23 DIAGNOSIS — G2111 Neuroleptic induced parkinsonism: Secondary | ICD-10-CM

## 2022-11-23 DIAGNOSIS — F3162 Bipolar disorder, current episode mixed, moderate: Secondary | ICD-10-CM | POA: Diagnosis not present

## 2022-11-23 DIAGNOSIS — I152 Hypertension secondary to endocrine disorders: Secondary | ICD-10-CM | POA: Diagnosis not present

## 2022-11-23 DIAGNOSIS — F4310 Post-traumatic stress disorder, unspecified: Secondary | ICD-10-CM

## 2022-11-23 MED ORDER — BREZTRI AEROSPHERE 160-9-4.8 MCG/ACT IN AERO
2.0000 | INHALATION_SPRAY | Freq: Two times a day (BID) | RESPIRATORY_TRACT | 11 refills | Status: DC
Start: 2022-11-23 — End: 2023-06-26

## 2022-11-23 NOTE — Assessment & Plan Note (Addendum)
Chronic, ongoing.  A1c 6.9% last visit, slight trend up but remains below goal. Recheck today. Urine ALB 30 (January 2024).  Will continue Metformin XR 500 MG BID and adjust as needed.  Recommend she monitor BS at home every morning, fasting, and document for provider + bring to visits.  Focus on diabetic diet.   - Eye and foot exam up to date - Statin on board - Needs ARB low dose in future if can tolerate, low dose ACE caused hypotension - Vaccines up to date - refuses flu

## 2022-11-23 NOTE — Assessment & Plan Note (Signed)
Chronic, ongoing.  Continue collaboration with neurology and current regimen as prescribed by them. Recent notes reviewed and medication changes.

## 2022-11-23 NOTE — Assessment & Plan Note (Signed)
Chronic, ongoing.  Denies SI/HI.  Continue current medication regimen as prescribed by psychiatry, recommend she reach out to them about sleep and mood with changes recently.  Will attempt to get notes from [facility].

## 2022-11-23 NOTE — Assessment & Plan Note (Signed)
Chronic, stable.  Continue collaboration with neurology as needed.  Continue current medication regimen.  Recent note and medication changes reviewed.

## 2022-11-23 NOTE — Assessment & Plan Note (Addendum)
Chronic, ongoing.  Continue Atorvastatin 40 MG daily and adjust as needed, refills up to date.  Lipid panel today.

## 2022-11-23 NOTE — Assessment & Plan Note (Signed)
Refer to diabetes with obesity plan of care.  Continue collaboration with neurology and current Gabapentin dosing. 

## 2022-11-23 NOTE — Assessment & Plan Note (Signed)
Chronic, ongoing.  Recommend cessation of smoking, she has increased her PPD and is coughing more.  Monitor closely for return to tobacco use.  Will continue Albuterol as needed.  Stop Breo and start Breztri, see if more benefit from triple therapy. Spirometry next visit.  Start lung CT screening at age 47.  Would benefit from no ACE in future and switch to ARB due to suspected COPD.

## 2022-11-23 NOTE — Assessment & Plan Note (Addendum)
Chronic, stable.  BP at goal on check today.  Remain off Lisinopril for now, consider low dose ARB in future for kidney protection if needed + suspect underlying COPD -- avoid hypotension.  Urine ALB 30 (January 2024).  A1c 6.9% last visit, recheck today.  Recommend she monitor BP at home daily and document + bring to visits.  Focus on DASH diet.  LABS: CMP.  Referral for sleep study placed past visit, has not attended.
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
## 2022-11-24 LAB — COMPREHENSIVE METABOLIC PANEL
ALT: 22 [IU]/L (ref 0–32)
AST: 14 [IU]/L (ref 0–40)
Albumin: 4 g/dL (ref 3.9–4.9)
Alkaline Phosphatase: 99 [IU]/L (ref 44–121)
BUN/Creatinine Ratio: 20 (ref 9–23)
BUN: 13 mg/dL (ref 6–24)
Bilirubin Total: <0.2 mg/dL (ref 0.0–1.2)
CO2: 22 mmol/L (ref 20–29)
Calcium: 9.1 mg/dL (ref 8.7–10.2)
Chloride: 101 mmol/L (ref 96–106)
Creatinine, Ser: 0.64 mg/dL (ref 0.57–1.00)
Globulin, Total: 1.9 g/dL (ref 1.5–4.5)
Glucose: 141 mg/dL — ABNORMAL HIGH (ref 70–99)
Potassium: 4.1 mmol/L (ref 3.5–5.2)
Sodium: 140 mmol/L (ref 134–144)
Total Protein: 5.9 g/dL — ABNORMAL LOW (ref 6.0–8.5)
eGFR: 109 mL/min/{1.73_m2} (ref >59)

## 2022-11-24 LAB — LIPID PANEL W/O CHOL/HDL RATIO
Cholesterol, Total: 113 mg/dL (ref 100–199)
HDL: 28 mg/dL — ABNORMAL LOW (ref >39)
LDL Chol Calc (NIH): 41 mg/dL (ref 0–99)
Triglycerides: 291 mg/dL — ABNORMAL HIGH (ref 0–149)
VLDL Cholesterol Cal: 44 mg/dL — ABNORMAL HIGH (ref 5–40)

## 2022-11-24 LAB — HEMOGLOBIN A1C
Est. average glucose Bld gHb Est-mCnc: 137 mg/dL
Hgb A1c MFr Bld: 6.4 % — ABNORMAL HIGH (ref 4.8–5.6)

## 2022-11-24 LAB — PROLACTIN: Prolactin: 7.2 ng/mL (ref 4.8–33.4)

## 2022-11-24 LAB — TSH: TSH: 0.721 u[IU]/mL (ref 0.450–4.500)

## 2022-11-24 NOTE — Progress Notes (Signed)
Contacted via MyChart   Good morning Vanessa Baker, your labs have returned: - A1c remains stable at 6.4%, a decrease from 6.9% last visit.  Great news!! - Kidney function, creatinine and eGFR, remains normal, as is liver function, AST and ALT.  - Lipid panel shows at goal LDL, triglycerides a little high.  Continue Atorvastatin and take an Omega 3 supplement over the counter daily. - Remainder of labs stable, you can show these to psychiatry.  Any questions? Keep being amazing!!  Thank you for allowing me to participate in your care.  I appreciate you. Kindest regards, Adell Panek

## 2022-11-26 ENCOUNTER — Encounter: Payer: Self-pay | Admitting: Nurse Practitioner

## 2022-11-27 ENCOUNTER — Other Ambulatory Visit: Payer: Self-pay | Admitting: Nurse Practitioner

## 2022-11-28 NOTE — Telephone Encounter (Signed)
Requested Prescriptions  Pending Prescriptions Disp Refills   rOPINIRole (REQUIP) 2 MG tablet [Pharmacy Med Name: rOPINIRole HCl 2 MG Oral Tablet] 90 tablet 0    Sig: TAKE 1 TABLET BY MOUTH AT BEDTIME     Neurology:  Parkinsonian Agents Passed - 11/27/2022  6:43 AM      Passed - Last BP in normal range    BP Readings from Last 1 Encounters:  11/23/22 109/64         Passed - Last Heart Rate in normal range    Pulse Readings from Last 1 Encounters:  11/23/22 98         Passed - Valid encounter within last 12 months    Recent Outpatient Visits           5 days ago Type 2 diabetes mellitus with obesity (HCC)   [place] [facility] [place], [place] T, NP   3 months ago Type 2 diabetes mellitus with obesity (HCC)   [place] [facility] [place], [place] T, NP   6 months ago Type 2 diabetes mellitus with obesity (HCC)   [place] [facility] [place], Dorie Rank, NP   9 months ago Medicare annual wellness visit, subsequent   [place] [facility] [place], Corrie Dandy T, NP   1 year ago Pruritus   [place] [facility] [place], Dorie Rank, NP       Future Appointments             In 3 weeks [place], Dorie Rank, NP  [facility]   In 3 months [place], Dorie Rank, NP  [facility]

## 2022-12-04 DIAGNOSIS — M7062 Trochanteric bursitis, left hip: Secondary | ICD-10-CM | POA: Diagnosis not present

## 2022-12-04 DIAGNOSIS — M7061 Trochanteric bursitis, right hip: Secondary | ICD-10-CM | POA: Diagnosis not present

## 2022-12-10 ENCOUNTER — Other Ambulatory Visit: Payer: Self-pay | Admitting: Nurse Practitioner

## 2022-12-11 NOTE — Telephone Encounter (Signed)
Requested Prescriptions  Pending Prescriptions Disp Refills   VENTOLIN HFA 108 (90 Base) MCG/ACT inhaler [Pharmacy Med Name: Ventolin HFA 108 (90 Base) MCG/ACT Inhalation Aerosol Solution] 18 g 0    Sig: INHALE 2 PUFFS BY MOUTH EVERY 6 HOURS AS NEEDED FOR WHEEZING AND FOR SHORTNESS OF BREATH     Pulmonology:  Beta Agonists 2 Passed - 12/10/2022  6:42 AM      Passed - Last BP in normal range    BP Readings from Last 1 Encounters:  11/23/22 109/64         Passed - Last Heart Rate in normal range    Pulse Readings from Last 1 Encounters:  11/23/22 98         Passed - Valid encounter within last 12 months    Recent Outpatient Visits           2 weeks ago Type 2 diabetes mellitus with obesity (HCC)   [place] [facility] [place], [place] T, NP   3 months ago Type 2 diabetes mellitus with obesity (HCC)   [place] [facility] [place], [place] T, NP   6 months ago Type 2 diabetes mellitus with obesity (HCC)   [place] [facility] [place], Dorie Rank, NP   10 months ago Medicare annual wellness visit, subsequent   [place] [facility] [place], Corrie Dandy T, NP   1 year ago Pruritus   [place] [facility] [place], Dorie Rank, NP       Future Appointments             In 1 week Cannady, Dorie Rank, NP [place] [facility]   In 2 months [place], Dorie Rank, NP [place] [facility]

## 2022-12-17 ENCOUNTER — Other Ambulatory Visit: Payer: Self-pay | Admitting: Nurse Practitioner

## 2022-12-18 NOTE — Telephone Encounter (Signed)
Requested Prescriptions  Pending Prescriptions Disp Refills   Lancets (ONETOUCH DELICA PLUS LANCET33G) MISC [Pharmacy Med Name: ONETOUCH DELICA LAN 33G MIS] 100 each 0    Sig: USE TO TEST BLOOD SUGAR THREE TIMES DAILY (IN THE MORNING, AT NOON, AND AT BEDTIME)     Endocrinology: Diabetes - Testing Supplies Passed - 12/17/2022 12:03 PM      Passed - Valid encounter within last 12 months    Recent Outpatient Visits           3 weeks ago Type 2 diabetes mellitus with obesity (HCC)   [place] [facility] [place], [place] T, NP   3 months ago Type 2 diabetes mellitus with obesity (HCC)   [place] [facility] [place], [place] T, NP   6 months ago Type 2 diabetes mellitus with obesity (HCC)   [place] [facility] [place], Dorie Rank, NP   10 months ago Medicare annual wellness visit, subsequent   [place] [facility] [place], Corrie Dandy T, NP   1 year ago Pruritus   [place] [facility] [place], Dorie Rank, NP       Future Appointments             In 3 days Cannady, Dorie Rank, NP [place] [facility]   In 2 months [place], Dorie Rank, NP [place] [facility]

## 2022-12-20 DIAGNOSIS — G250 Essential tremor: Secondary | ICD-10-CM | POA: Diagnosis not present

## 2022-12-20 DIAGNOSIS — G479 Sleep disorder, unspecified: Secondary | ICD-10-CM | POA: Diagnosis not present

## 2022-12-20 DIAGNOSIS — G2581 Restless legs syndrome: Secondary | ICD-10-CM | POA: Diagnosis not present

## 2022-12-20 DIAGNOSIS — R519 Headache, unspecified: Secondary | ICD-10-CM | POA: Diagnosis not present

## 2022-12-20 DIAGNOSIS — M7062 Trochanteric bursitis, left hip: Secondary | ICD-10-CM | POA: Diagnosis not present

## 2022-12-20 DIAGNOSIS — M7061 Trochanteric bursitis, right hip: Secondary | ICD-10-CM | POA: Diagnosis not present

## 2022-12-21 ENCOUNTER — Ambulatory Visit: Payer: 59 | Admitting: Nurse Practitioner

## 2022-12-25 ENCOUNTER — Other Ambulatory Visit: Payer: Self-pay | Admitting: Nurse Practitioner

## 2022-12-26 ENCOUNTER — Other Ambulatory Visit: Payer: Self-pay | Admitting: Physician Assistant

## 2022-12-26 DIAGNOSIS — E669 Obesity, unspecified: Secondary | ICD-10-CM

## 2022-12-26 DIAGNOSIS — E1169 Type 2 diabetes mellitus with other specified complication: Secondary | ICD-10-CM

## 2022-12-27 ENCOUNTER — Other Ambulatory Visit: Payer: Self-pay | Admitting: Nurse Practitioner

## 2022-12-27 MED ORDER — METFORMIN HCL ER 500 MG PO TB24: 500.0000 mg | ORAL_TABLET | Freq: Two times a day (BID) | ORAL | 4 refills | Status: DC

## 2022-12-27 NOTE — Telephone Encounter (Signed)
Requested medications are due for refill today.  Unsure  Requested medications are on the active medications list.  yes  Last refill. 02/12/2022 #90 4 rf  Future visit scheduled.   yes  Notes to clinic.  Please review sig. Pt states they are taking medication 2 times daily. Rx is for 1 time daily.    Requested Prescriptions  Pending Prescriptions Disp Refills   metFORMIN (GLUCOPHAGE) 500 MG tablet [Pharmacy Med Name: metFORMIN HCl 500 MG Oral Tablet] 180 tablet 0    Sig: TAKE 1 TABLET BY MOUTH TWICE DAILY WITH A MEAL     Endocrinology:  Diabetes - Biguanides Failed - 12/26/2022  6:45 AM      Failed - B12 Level in normal range and within 720 days    Vitamin B-12  Date Value Ref Range Status  05/23/2022 1,543 (H) 232 - 1,245 pg/mL Final         Passed - Cr in normal range and within 360 days    Creat  Date Value Ref Range Status  12/19/2018 0.68 0.50 - 1.10 mg/dL Final   Creatinine, Ser  Date Value Ref Range Status  11/23/2022 0.64 0.57 - 1.00 mg/dL Final         Passed - HBA1C is between 0 and 7.9 and within 180 days    Hemoglobin A1C  Date Value Ref Range Status  12/09/2014 5.4  Final   HB A1C (BAYER DCA - WAIVED)  Date Value Ref Range Status  08/22/2022 6.9 (H) 4.8 - 5.6 % Final    Comment:             Prediabetes: 5.7 - 6.4          Diabetes: >6.4          Glycemic control for adults with diabetes: <7.0    Hgb A1c MFr Bld  Date Value Ref Range Status  11/23/2022 6.4 (H) 4.8 - 5.6 % Final    Comment:             Prediabetes: 5.7 - 6.4          Diabetes: >6.4          Glycemic control for adults with diabetes: <7.0          Passed - eGFR in normal range and within 360 days    GFR, Est African American  Date Value Ref Range Status  12/19/2018 123 > OR = 60 mL/min/1.34m2 Final   GFR calc Af Amer  Date Value Ref Range Status  01/13/2020 108 >59 mL/min/1.73 Final    Comment:    **In accordance with recommendations from the NKF-ASN Task force,**   [facility]  is in the process of updating its eGFR calculation to the   2021 CKD-EPI creatinine equation that estimates kidney function   without a race variable.    GFR, Est Non African American  Date Value Ref Range Status  12/19/2018 106 > OR = 60 mL/min/1.29m2 Final   GFR, Estimated  Date Value Ref Range Status  11/09/2021 >60 >60 mL/min Final    Comment:    (NOTE) Calculated using the CKD-EPI Creatinine Equation (2021)    eGFR  Date Value Ref Range Status  11/23/2022 109 >59 mL/min/1.73 Final         Passed - Valid encounter within last 6 months    Recent Outpatient Visits           1 month ago Type 2 diabetes mellitus with obesity (HCC)  [place] [facility] [place], [place] T, NP   4 months ago Type 2 diabetes mellitus with obesity (HCC)   [place] [facility] [place], [place] T, NP   7 months ago Type 2 diabetes mellitus with obesity (HCC)   [place] [facility] [place], [place] T, NP   10 months ago Medicare annual wellness visit, subsequent   [place] [facility] [place], Rudolph T, NP   1 year ago Pruritus   [place] [facility] [place], Corrie Dandy T, NP       Future Appointments             In 2 months Cannady, Dorie Rank, NP [place] [facility]            Passed - CBC within normal limits and completed in the last 12 months    WBC  Date Value Ref Range Status  02/12/2022 7.6 3.4 - 10.8 x10E3/uL Final  11/09/2021 13.5 (H) 4.0 - 10.5 K/uL Final   RBC  Date Value Ref Range Status  02/12/2022 4.95 3.77 - 5.28 x10E6/uL Final  11/09/2021 5.03 3.87 - 5.11 MIL/uL Final   Hemoglobin  Date Value Ref Range Status  02/12/2022 14.9 11.1 - 15.9 g/dL Final   Hematocrit  Date Value Ref Range Status  02/12/2022 43.2 34.0 - 46.6 % Final   MCHC  Date Value Ref Range Status  02/12/2022 34.5 31.5 - 35.7 g/dL Final  95/28/4132 44.0 30.0 - 36.0 g/dL Final   [facility]  Date Value  Ref Range Status  02/12/2022 30.1 26.6 - 33.0 pg Final  11/09/2021 30.0 26.0 - 34.0 pg Final   MCV  Date Value Ref Range Status  02/12/2022 87 79 - 97 fL Final  06/11/2013 94 80 - 100 fL Final   No results found for: "PLTCOUNTKUC", "LABPLAT", "POCPLA" RDW  Date Value Ref Range Status  02/12/2022 12.0 11.7 - 15.4 % Final  06/11/2013 12.9 11.5 - 14.5 % Final

## 2022-12-27 NOTE — Telephone Encounter (Signed)
Requested Prescriptions  Pending Prescriptions Disp Refills   albuterol (VENTOLIN HFA) 108 (90 Base) MCG/ACT inhaler [Pharmacy Med Name: Albuterol Sulfate HFA 108 (90 Base) MCG/ACT Inhalation Aerosol Solution] 18 g 2    Sig: INHALE 2 PUFFS BY MOUTH EVERY 6 HOURS AS NEEDED FOR WHEEZING AND FOR SHORTNESS OF BREATH     Pulmonology:  Beta Agonists 2 Passed - 12/25/2022  5:48 PM      Passed - Last BP in normal range    BP Readings from Last 1 Encounters:  11/23/22 109/64         Passed - Last Heart Rate in normal range    Pulse Readings from Last 1 Encounters:  11/23/22 98         Passed - Valid encounter within last 12 months    Recent Outpatient Visits           1 month ago Type 2 diabetes mellitus with obesity (HCC)   [place] [facility] [place], [place] T, NP   4 months ago Type 2 diabetes mellitus with obesity (HCC)   [place] [facility] [place], [place] T, NP   7 months ago Type 2 diabetes mellitus with obesity (HCC)   [place] [facility] [place], Dorie Rank, NP   10 months ago Medicare annual wellness visit, subsequent   [place] [facility] [place], Corrie Dandy T, NP   1 year ago Pruritus   [place] [facility] [place], Dorie Rank, NP       Future Appointments             In 2 months Cannady, Dorie Rank, NP [place] [facility]

## 2023-01-02 NOTE — Progress Notes (Signed)
Pharmacy Quality Measure Review  This patient is appearing on a report for being at risk of failing the adherence measure for diabetes medications this calendar year.   Medication: metformin XR 500mg  Last fill date: 12/27/22  for 90 day supply  Insurance report was not up to date. No action needed at this time.   Lenna Gilford, PharmD, DPLA

## 2023-01-04 ENCOUNTER — Other Ambulatory Visit: Payer: Self-pay | Admitting: Nurse Practitioner

## 2023-01-08 NOTE — Telephone Encounter (Signed)
Requested medications are due for refill today.  yes  Requested medications are on the active medications list.  yes  Last refill. 11/20/2021 #100 12 rf  Future visit scheduled.     Notes to clinic.  No protocol for this medication.    Requested Prescriptions  Pending Prescriptions Disp Refills   ACCU-CHEK GUIDE TEST test strip [Pharmacy Med Name: Accu-Chek Guide In Vitro Strip] 100 each 0    Sig: USE TO CHECK BLOOD SUGAR  TWICE DAILY     There is no refill protocol information for this order

## 2023-01-14 ENCOUNTER — Other Ambulatory Visit: Payer: Self-pay

## 2023-01-14 ENCOUNTER — Other Ambulatory Visit: Payer: Self-pay | Admitting: Physician Assistant

## 2023-01-14 DIAGNOSIS — E1169 Type 2 diabetes mellitus with other specified complication: Secondary | ICD-10-CM

## 2023-01-14 MED ORDER — ONETOUCH DELICA PLUS LANCET33G MISC: 11 refills | Status: DC

## 2023-01-15 NOTE — Telephone Encounter (Signed)
Prescription was discontinued 02/12/22 by PCP due to change in therapy.  Requested Prescriptions  Pending Prescriptions Disp Refills   metFORMIN (GLUCOPHAGE) 500 MG tablet [Pharmacy Med Name: metFORMIN HCl 500 MG Oral Tablet] 180 tablet 0    Sig: TAKE 1 TABLET BY MOUTH TWICE DAILY WITH A MEAL     Endocrinology:  Diabetes - Biguanides Failed - 01/14/2023  1:34 PM      Failed - B12 Level in normal range and within 720 days    Vitamin B-12  Date Value Ref Range Status  05/23/2022 1,543 (H) 232 - 1,245 pg/mL Final         Passed - Cr in normal range and within 360 days    Creat  Date Value Ref Range Status  12/19/2018 0.68 0.50 - 1.10 mg/dL Final   Creatinine, Ser  Date Value Ref Range Status  11/23/2022 0.64 0.57 - 1.00 mg/dL Final         Passed - HBA1C is between 0 and 7.9 and within 180 days    Hemoglobin A1C  Date Value Ref Range Status  12/09/2014 5.4  Final   HB A1C (BAYER DCA - WAIVED)  Date Value Ref Range Status  08/22/2022 6.9 (H) 4.8 - 5.6 % Final    Comment:             Prediabetes: 5.7 - 6.4          Diabetes: >6.4          Glycemic control for adults with diabetes: <7.0    Hgb A1c MFr Bld  Date Value Ref Range Status  11/23/2022 6.4 (H) 4.8 - 5.6 % Final    Comment:             Prediabetes: 5.7 - 6.4          Diabetes: >6.4          Glycemic control for adults with diabetes: <7.0          Passed - eGFR in normal range and within 360 days    GFR, Est African American  Date Value Ref Range Status  12/19/2018 123 > OR = 60 mL/min/1.70m2 Final   GFR calc Af Amer  Date Value Ref Range Status  01/13/2020 108 >59 mL/min/1.73 Final    Comment:    **In accordance with recommendations from the NKF-ASN Task force,**   [facility] is in the process of updating its eGFR calculation to the   2021 CKD-EPI creatinine equation that estimates kidney function   without a race variable.    GFR, Est Non African American  Date Value Ref Range Status  12/19/2018 106 > OR  = 60 mL/min/1.33m2 Final   GFR, Estimated  Date Value Ref Range Status  11/09/2021 >60 >60 mL/min Final    Comment:    (NOTE) Calculated using the CKD-EPI Creatinine Equation (2021)    eGFR  Date Value Ref Range Status  11/23/2022 109 >59 mL/min/1.73 Final         Passed - Valid encounter within last 6 months    Recent Outpatient Visits           1 month ago Type 2 diabetes mellitus with obesity (HCC)   [facility] [place], [place] T, NP   4 months ago Type 2 diabetes mellitus with obesity (HCC)   [place] [facility] [place], [place] T, NP   7 months ago Type 2 diabetes mellitus with obesity (HCC)   [place]  [facility] [place], [place] T, NP   11 months ago [employer] annual wellness visit, subsequent   [place] [facility] [place], [place] T, NP   1 year ago Pruritus   [place] [facility] [place], [place]bury T, NP       Future Appointments             In 1 month [place], Dorie Rank, NP  [facility], [facility]            Passed - CBC within normal limits and completed in the last 12 months    WBC  Date Value Ref Range Status  02/12/2022 7.6 3.4 - 10.8 x10E3/uL Final  11/09/2021 13.5 (H) 4.0 - 10.5 K/uL Final   RBC  Date Value Ref Range Status  02/12/2022 4.95 3.77 - 5.28 x10E6/uL Final  11/09/2021 5.03 3.87 - 5.11 MIL/uL Final   Hemoglobin  Date Value Ref Range Status  02/12/2022 14.9 11.1 - 15.9 g/dL Final   Hematocrit  Date Value Ref Range Status  02/12/2022 43.2 34.0 - 46.6 % Final   MCHC  Date Value Ref Range Status  02/12/2022 34.5 31.5 - 35.7 g/dL Final  95/62/1308 65.7 30.0 - 36.0 g/dL Final   [facility]  Date Value Ref Range Status  02/12/2022 30.1 26.6 - 33.0 pg Final  11/09/2021 30.0 26.0 - 34.0 pg Final   MCV  Date Value Ref Range Status  02/12/2022 87 79 - 97 fL Final  06/11/2013 94 80 - 100 fL Final   No results found for: "PLTCOUNTKUC",  "LABPLAT", "POCPLA" RDW  Date Value Ref Range Status  02/12/2022 12.0 11.7 - 15.4 % Final  06/11/2013 12.9 11.5 - 14.5 % Final

## 2023-01-21 ENCOUNTER — Other Ambulatory Visit: Payer: Self-pay | Admitting: Physician Assistant

## 2023-01-21 DIAGNOSIS — E1169 Type 2 diabetes mellitus with other specified complication: Secondary | ICD-10-CM

## 2023-01-22 NOTE — Telephone Encounter (Signed)
Requested by interface surescripts. Medication discontinued 02/12/22.  Requested Prescriptions  Refused Prescriptions Disp Refills   metFORMIN (GLUCOPHAGE) 500 MG tablet [Pharmacy Med Name: metFORMIN HCl 500 MG Oral Tablet] 180 tablet 0    Sig: TAKE 1 TABLET BY MOUTH TWICE DAILY WITH A MEAL     Endocrinology:  Diabetes - Biguanides Failed - 01/22/2023  2:15 PM      Failed - B12 Level in normal range and within 720 days    Vitamin B-12  Date Value Ref Range Status  05/23/2022 1,543 (H) 232 - 1,245 pg/mL Final         Passed - Cr in normal range and within 360 days    Creat  Date Value Ref Range Status  12/19/2018 0.68 0.50 - 1.10 mg/dL Final   Creatinine, Ser  Date Value Ref Range Status  11/23/2022 0.64 0.57 - 1.00 mg/dL Final         Passed - HBA1C is between 0 and 7.9 and within 180 days    Hemoglobin A1C  Date Value Ref Range Status  12/09/2014 5.4  Final   HB A1C (BAYER DCA - WAIVED)  Date Value Ref Range Status  08/22/2022 6.9 (H) 4.8 - 5.6 % Final    Comment:             Prediabetes: 5.7 - 6.4          Diabetes: >6.4          Glycemic control for adults with diabetes: <7.0    Hgb A1c MFr Bld  Date Value Ref Range Status  11/23/2022 6.4 (H) 4.8 - 5.6 % Final    Comment:             Prediabetes: 5.7 - 6.4          Diabetes: >6.4          Glycemic control for adults with diabetes: <7.0          Passed - eGFR in normal range and within 360 days    GFR, Est African American  Date Value Ref Range Status  12/19/2018 123 > OR = 60 mL/min/1.36m2 Final   GFR calc Af Amer  Date Value Ref Range Status  01/13/2020 108 >59 mL/min/1.73 Final    Comment:    **In accordance with recommendations from the NKF-ASN Task force,**   [facility] is in the process of updating its eGFR calculation to the   2021 CKD-EPI creatinine equation that estimates kidney function   without a race variable.    GFR, Est Non African American  Date Value Ref Range Status  12/19/2018 106 > OR  = 60 mL/min/1.76m2 Final   GFR, Estimated  Date Value Ref Range Status  11/09/2021 >60 >60 mL/min Final    Comment:    (NOTE) Calculated using the CKD-EPI Creatinine Equation (2021)    eGFR  Date Value Ref Range Status  11/23/2022 109 >59 mL/min/1.73 Final         Passed - Valid encounter within last 6 months    Recent Outpatient Visits           2 months ago Type 2 diabetes mellitus with obesity (HCC)   [place] [facility] [place], [place] T, NP   5 months ago Type 2 diabetes mellitus with obesity (HCC)   [place] [facility] [place], [place] T, NP   8 months ago Type 2 diabetes mellitus with obesity (HCC)   [place] [facility] [place],  Dorie Rank, NP   11 months ago [employer] annual wellness visit, subsequent   [place] [facility] [place], [place] T, NP   1 year ago Pruritus   [place] [facility] [place], [place] T, NP       Future Appointments             In 1 month [place], Dorie Rank, NP [place] [facility], [facility]            Passed - CBC within normal limits and completed in the last 12 months    WBC  Date Value Ref Range Status  02/12/2022 7.6 3.4 - 10.8 x10E3/uL Final  11/09/2021 13.5 (H) 4.0 - 10.5 K/uL Final   RBC  Date Value Ref Range Status  02/12/2022 4.95 3.77 - 5.28 x10E6/uL Final  11/09/2021 5.03 3.87 - 5.11 MIL/uL Final   Hemoglobin  Date Value Ref Range Status  02/12/2022 14.9 11.1 - 15.9 g/dL Final   Hematocrit  Date Value Ref Range Status  02/12/2022 43.2 34.0 - 46.6 % Final   MCHC  Date Value Ref Range Status  02/12/2022 34.5 31.5 - 35.7 g/dL Final  96/05/5407 81.1 30.0 - 36.0 g/dL Final   [facility]  Date Value Ref Range Status  02/12/2022 30.1 26.6 - 33.0 pg Final  11/09/2021 30.0 26.0 - 34.0 pg Final   MCV  Date Value Ref Range Status  02/12/2022 87 79 - 97 fL Final  06/11/2013 94 80 - 100 fL Final   No results found for: "PLTCOUNTKUC",  "LABPLAT", "POCPLA" RDW  Date Value Ref Range Status  02/12/2022 12.0 11.7 - 15.4 % Final  06/11/2013 12.9 11.5 - 14.5 % Final

## 2023-01-31 ENCOUNTER — Other Ambulatory Visit: Payer: Self-pay | Admitting: Physician Assistant

## 2023-01-31 DIAGNOSIS — E669 Obesity, unspecified: Secondary | ICD-10-CM

## 2023-02-05 NOTE — Telephone Encounter (Signed)
 Requested by interface surescripts. Medication discontinued 02/12/22.  Requested Prescriptions  Refused Prescriptions Disp Refills   metFORMIN  (GLUCOPHAGE ) 500 MG tablet [Pharmacy Med Name: metFORMIN  HCl 500 MG Oral Tablet] 180 tablet 0    Sig: TAKE 1 TABLET BY MOUTH TWICE DAILY WITH A MEAL     Endocrinology:  Diabetes - Biguanides Failed - 02/05/2023  3:04 PM      Failed - B12 Level in normal range and within 720 days    Vitamin B-12  Date Value Ref Range Status  05/23/2022 1,543 (H) 232 - 1,245 pg/mL Final         Passed - Cr in normal range and within 360 days    Creat  Date Value Ref Range Status  12/19/2018 0.68 0.50 - 1.10 mg/dL Final   Creatinine, Ser  Date Value Ref Range Status  11/23/2022 0.64 0.57 - 1.00 mg/dL Final         Passed - HBA1C is between 0 and 7.9 and within 180 days    Hemoglobin A1C  Date Value Ref Range Status  12/09/2014 5.4  Final   HB A1C (BAYER DCA - WAIVED)  Date Value Ref Range Status  08/22/2022 6.9 (H) 4.8 - 5.6 % Final    Comment:             Prediabetes: 5.7 - 6.4          Diabetes: >6.4          Glycemic control for adults with diabetes: <7.0    Hgb A1c MFr Bld  Date Value Ref Range Status  11/23/2022 6.4 (H) 4.8 - 5.6 % Final    Comment:             Prediabetes: 5.7 - 6.4          Diabetes: >6.4          Glycemic control for adults with diabetes: <7.0          Passed - eGFR in normal range and within 360 days    GFR, Est African American  Date Value Ref Range Status  12/19/2018 123 > OR = 60 mL/min/1.26m2 Final   GFR calc Af Amer  Date Value Ref Range Status  01/13/2020 108 >59 mL/min/1.73 Final    Comment:    **In accordance with recommendations from the NKF-ASN Task force,**   [facility] is in the process of updating its eGFR calculation to the   2021 CKD-EPI creatinine equation that estimates kidney function   without a race variable.    GFR, Est Non African American  Date Value Ref Range Status  12/19/2018 106 > OR  = 60 mL/min/1.23m2 Final   GFR, Estimated  Date Value Ref Range Status  11/09/2021 >60 >60 mL/min Final    Comment:    (NOTE) Calculated using the CKD-EPI Creatinine Equation (2021)    eGFR  Date Value Ref Range Status  11/23/2022 109 >59 mL/min/1.73 Final         Passed - Valid encounter within last 6 months    Recent Outpatient Visits           2 months ago Type 2 diabetes mellitus with obesity (HCC)   [place] [facility] [place], [place] T, NP   5 months ago Type 2 diabetes mellitus with obesity (HCC)   [place] [facility] [place], Mullin T, NP   8 months ago Type 2 diabetes mellitus with obesity (HCC)   [place] [facility] [place],  Melanie DASEN, NP   11 months ago Medicare annual wellness visit, subsequent   [place] [facility] [place], [place] T, NP   1 year ago Pruritus   [place] [facility] [place], [place] T, NP       Future Appointments             In 3 weeks Cannady, Jolene T, NP [place] [facility] [place]            Passed - CBC within normal limits and completed in the last 12 months    WBC  Date Value Ref Range Status  02/12/2022 7.6 3.4 - 10.8 x10E3/uL Final  11/09/2021 13.5 (H) 4.0 - 10.5 K/uL Final   RBC  Date Value Ref Range Status  02/12/2022 4.95 3.77 - 5.28 x10E6/uL Final  11/09/2021 5.03 3.87 - 5.11 MIL/uL Final   Hemoglobin  Date Value Ref Range Status  02/12/2022 14.9 11.1 - 15.9 g/dL Final   Hematocrit  Date Value Ref Range Status  02/12/2022 43.2 34.0 - 46.6 % Final   MCHC  Date Value Ref Range Status  02/12/2022 34.5 31.5 - 35.7 g/dL Final  89/94/7976 66.9 30.0 - 36.0 g/dL Final   [facility]  Date Value Ref Range Status  02/12/2022 30.1 26.6 - 33.0 pg Final  11/09/2021 30.0 26.0 - 34.0 pg Final   MCV  Date Value Ref Range Status  02/12/2022 87 79 - 97 fL Final  06/11/2013 94 80 - 100 fL Final   No results found for: PLTCOUNTKUC,  LABPLAT, POCPLA RDW  Date Value Ref Range Status  02/12/2022 12.0 11.7 - 15.4 % Final  06/11/2013 12.9 11.5 - 14.5 % Final

## 2023-02-08 ENCOUNTER — Other Ambulatory Visit: Payer: Self-pay | Admitting: Physician Assistant

## 2023-02-08 DIAGNOSIS — E1169 Type 2 diabetes mellitus with other specified complication: Secondary | ICD-10-CM

## 2023-02-11 NOTE — Telephone Encounter (Signed)
 Medication dosage changed on 02/12/22, will refuse this request.  Requested Prescriptions  Pending Prescriptions Disp Refills   metFORMIN  (GLUCOPHAGE ) 500 MG tablet [Pharmacy Med Name: metFORMIN  HCl 500 MG Oral Tablet] 180 tablet 0    Sig: TAKE 1 TABLET BY MOUTH TWICE DAILY WITH A MEAL     Endocrinology:  Diabetes - Biguanides Failed - 02/11/2023  5:44 PM      Failed - B12 Level in normal range and within 720 days    Vitamin B-12  Date Value Ref Range Status  05/23/2022 1,543 (H) 232 - 1,245 pg/mL Final         Failed - CBC within normal limits and completed in the last 12 months    WBC  Date Value Ref Range Status  02/12/2022 7.6 3.4 - 10.8 x10E3/uL Final  11/09/2021 13.5 (H) 4.0 - 10.5 K/uL Final   RBC  Date Value Ref Range Status  02/12/2022 4.95 3.77 - 5.28 x10E6/uL Final  11/09/2021 5.03 3.87 - 5.11 MIL/uL Final   Hemoglobin  Date Value Ref Range Status  02/12/2022 14.9 11.1 - 15.9 g/dL Final   Hematocrit  Date Value Ref Range Status  02/12/2022 43.2 34.0 - 46.6 % Final   MCHC  Date Value Ref Range Status  02/12/2022 34.5 31.5 - 35.7 g/dL Final  89/94/7976 66.9 30.0 - 36.0 g/dL Final   [facility]  Date Value Ref Range Status  02/12/2022 30.1 26.6 - 33.0 pg Final  11/09/2021 30.0 26.0 - 34.0 pg Final   MCV  Date Value Ref Range Status  02/12/2022 87 79 - 97 fL Final  06/11/2013 94 80 - 100 fL Final   No results found for: PLTCOUNTKUC, LABPLAT, POCPLA RDW  Date Value Ref Range Status  02/12/2022 12.0 11.7 - 15.4 % Final  06/11/2013 12.9 11.5 - 14.5 % Final         Passed - Cr in normal range and within 360 days    Creat  Date Value Ref Range Status  12/19/2018 0.68 0.50 - 1.10 mg/dL Final   Creatinine, Ser  Date Value Ref Range Status  11/23/2022 0.64 0.57 - 1.00 mg/dL Final         Passed - HBA1C is between 0 and 7.9 and within 180 days    Hemoglobin A1C  Date Value Ref Range Status  12/09/2014 5.4  Final   HB A1C (BAYER DCA - WAIVED)  Date  Value Ref Range Status  08/22/2022 6.9 (H) 4.8 - 5.6 % Final    Comment:             Prediabetes: 5.7 - 6.4          Diabetes: >6.4          Glycemic control for adults with diabetes: <7.0    Hgb A1c MFr Bld  Date Value Ref Range Status  11/23/2022 6.4 (H) 4.8 - 5.6 % Final    Comment:             Prediabetes: 5.7 - 6.4          Diabetes: >6.4          Glycemic control for adults with diabetes: <7.0          Passed - eGFR in normal range and within 360 days    GFR, Est African American  Date Value Ref Range Status  12/19/2018 123 > OR = 60 mL/min/1.27m2 Final   GFR calc Af Amer  Date Value Ref Range Status  01/13/2020  108 >59 mL/min/1.73 Final    Comment:    **In accordance with recommendations from the NKF-ASN Task force,**   [facility] is in the process of updating its eGFR calculation to the   2021 CKD-EPI creatinine equation that estimates kidney function   without a race variable.    GFR, Est Non African American  Date Value Ref Range Status  12/19/2018 106 > OR = 60 mL/min/1.1m2 Final   GFR, Estimated  Date Value Ref Range Status  11/09/2021 >60 >60 mL/min Final    Comment:    (NOTE) Calculated using the CKD-EPI Creatinine Equation (2021)    eGFR  Date Value Ref Range Status  11/23/2022 109 >59 mL/min/1.73 Final         Passed - Valid encounter within last 6 months    Recent Outpatient Visits           2 months ago Type 2 diabetes mellitus with obesity (HCC)   [place] [facility] [place], [place] T, NP   5 months ago Type 2 diabetes mellitus with obesity (HCC)   [place] [facility] [place], Foley T, NP   8 months ago Type 2 diabetes mellitus with obesity (HCC)   [place] [facility] [place], Melanie T, NP   12 months ago Medicare annual wellness visit, subsequent   [place] [facility] [place], Melanie T, NP   1 year ago Pruritus   [place] [facility] [place], Melanie DASEN,  NP       Future Appointments             In 2 weeks Cannady, Jolene T, NP [place] [facility], [facility]

## 2023-02-13 ENCOUNTER — Other Ambulatory Visit: Payer: Self-pay | Admitting: Nurse Practitioner

## 2023-02-13 DIAGNOSIS — F4310 Post-traumatic stress disorder, unspecified: Secondary | ICD-10-CM

## 2023-02-13 DIAGNOSIS — F3162 Bipolar disorder, current episode mixed, moderate: Secondary | ICD-10-CM

## 2023-02-15 NOTE — Telephone Encounter (Signed)
 Rx discontinued 11/23/22 due to dose change, refill not appropriate. Requested Prescriptions  Pending Prescriptions Disp Refills   sertraline  (ZOLOFT ) 50 MG tablet [Pharmacy Med Name: Sertraline  HCl 50 MG Oral Tablet] 90 tablet 0    Sig: Take 1 tablet by mouth once daily     Psychiatry:  Antidepressants - SSRI - sertraline  Passed - 02/15/2023  5:59 PM      Passed - AST in normal range and within 360 days    AST  Date Value Ref Range Status  11/23/2022 14 0 - 40 IU/L Final   SGOT(AST)  Date Value Ref Range Status  06/10/2013 102 (H) 15 - 37 Unit/L Final         Passed - ALT in normal range and within 360 days    ALT  Date Value Ref Range Status  11/23/2022 22 0 - 32 IU/L Final   SGPT (ALT)  Date Value Ref Range Status  06/10/2013 59 12 - 78 U/L Final         Passed - Completed PHQ-2 or PHQ-9 in the last 360 days      Passed - Valid encounter within last 6 months    Recent Outpatient Visits           2 months ago Type 2 diabetes mellitus with obesity (HCC)   [facility] [place], [place] T, NP   5 months ago Type 2 diabetes mellitus with obesity (HCC)   [place] [facility] [place], [place] T, NP   8 months ago Type 2 diabetes mellitus with obesity (HCC)   [place] [facility] [place], Melanie T, NP   1 year ago Medicare annual wellness visit, subsequent   [place] [facility] [place], Melanie T, NP   1 year ago Pruritus   [place] [facility] [place], Melanie DASEN, NP       Future Appointments             In 1 week Cannady, Melanie DASEN, NP [place] [facility], [facility]

## 2023-02-19 ENCOUNTER — Other Ambulatory Visit: Payer: Self-pay | Admitting: Physician Assistant

## 2023-02-19 ENCOUNTER — Ambulatory Visit: Payer: 59 | Admitting: Emergency Medicine

## 2023-02-19 DIAGNOSIS — E1169 Type 2 diabetes mellitus with other specified complication: Secondary | ICD-10-CM

## 2023-02-19 NOTE — Progress Notes (Signed)
 A user error has taken place: encounter opened in error, closed for administrative reasons.

## 2023-02-20 ENCOUNTER — Telehealth: Payer: Self-pay | Admitting: Nurse Practitioner

## 2023-02-20 NOTE — Telephone Encounter (Signed)
 discontinued on 02/12/2022 by Cannady, Jolene T, NP   Requested Prescriptions  Refused Prescriptions Disp Refills   metFORMIN  (GLUCOPHAGE ) 500 MG tablet [Pharmacy Med Name: metFORMIN  HCl 500 MG Oral Tablet] 180 tablet 0    Sig: TAKE 1 TABLET BY MOUTH TWICE DAILY WITH A MEAL     Endocrinology:  Diabetes - Biguanides Failed - 02/20/2023  8:16 AM      Failed - B12 Level in normal range and within 720 days    Vitamin B-12  Date Value Ref Range Status  05/23/2022 1,543 (H) 232 - 1,245 pg/mL Final         Failed - CBC within normal limits and completed in the last 12 months    WBC  Date Value Ref Range Status  02/12/2022 7.6 3.4 - 10.8 x10E3/uL Final  11/09/2021 13.5 (H) 4.0 - 10.5 K/uL Final   RBC  Date Value Ref Range Status  02/12/2022 4.95 3.77 - 5.28 x10E6/uL Final  11/09/2021 5.03 3.87 - 5.11 MIL/uL Final   Hemoglobin  Date Value Ref Range Status  02/12/2022 14.9 11.1 - 15.9 g/dL Final   Hematocrit  Date Value Ref Range Status  02/12/2022 43.2 34.0 - 46.6 % Final   MCHC  Date Value Ref Range Status  02/12/2022 34.5 31.5 - 35.7 g/dL Final  32/44/0102 72.5 30.0 - 36.0 g/dL Final   [facility]  Date Value Ref Range Status  02/12/2022 30.1 26.6 - 33.0 pg Final  11/09/2021 30.0 26.0 - 34.0 pg Final   MCV  Date Value Ref Range Status  02/12/2022 87 79 - 97 fL Final  06/11/2013 94 80 - 100 fL Final   No results found for: "PLTCOUNTKUC", "LABPLAT", "POCPLA" RDW  Date Value Ref Range Status  02/12/2022 12.0 11.7 - 15.4 % Final  06/11/2013 12.9 11.5 - 14.5 % Final         Passed - Cr in normal range and within 360 days    Creat  Date Value Ref Range Status  12/19/2018 0.68 0.50 - 1.10 mg/dL Final   Creatinine, Ser  Date Value Ref Range Status  11/23/2022 0.64 0.57 - 1.00 mg/dL Final         Passed - HBA1C is between 0 and 7.9 and within 180 days    Hemoglobin A1C  Date Value Ref Range Status  12/09/2014 5.4  Final   HB A1C (BAYER DCA - WAIVED)  Date Value Ref Range  Status  08/22/2022 6.9 (H) 4.8 - 5.6 % Final    Comment:             Prediabetes: 5.7 - 6.4          Diabetes: >6.4          Glycemic control for adults with diabetes: <7.0    Hgb A1c MFr Bld  Date Value Ref Range Status  11/23/2022 6.4 (H) 4.8 - 5.6 % Final    Comment:             Prediabetes: 5.7 - 6.4          Diabetes: >6.4          Glycemic control for adults with diabetes: <7.0          Passed - eGFR in normal range and within 360 days    GFR, Est African American  Date Value Ref Range Status  12/19/2018 123 > OR = 60 mL/min/1.45m2 Final   GFR calc Af Amer  Date Value Ref Range Status  01/13/2020  108 >59 mL/min/1.73 Final    Comment:    **In accordance with recommendations from the NKF-ASN Task force,**   [facility] is in the process of updating its eGFR calculation to the   2021 CKD-EPI creatinine equation that estimates kidney function   without a race variable.    GFR, Est Non African American  Date Value Ref Range Status  12/19/2018 106 > OR = 60 mL/min/1.5m2 Final   GFR, Estimated  Date Value Ref Range Status  11/09/2021 >60 >60 mL/min Final    Comment:    (NOTE) Calculated using the CKD-EPI Creatinine Equation (2021)    eGFR  Date Value Ref Range Status  11/23/2022 109 >59 mL/min/1.73 Final         Passed - Valid encounter within last 6 months    Recent Outpatient Visits           2 months ago Type 2 diabetes mellitus with obesity (HCC)   [place] [facility] [place], Brighton T, NP   6 months ago Type 2 diabetes mellitus with obesity (HCC)   [facility] [place], Miller T, NP   9 months ago Type 2 diabetes mellitus with obesity (HCC)   [place] [facility] [place], Sanjuan Crumbly T, NP   1 year ago Medicare annual wellness visit, subsequent   [place] [facility] [place], Sanjuan Crumbly T, NP   1 year ago Pruritus   [place] [facility] [place], Lavelle Posey, NP       Future  Appointments             In 6 days Cannady, Jolene T, NP  [facility], [facility]

## 2023-02-20 NOTE — Telephone Encounter (Signed)
 Copied from CRM [id number]. Topic: Appointment Scheduling - Scheduling Inquiry for Clinic >> Feb 20, 2023 11:37 AM Hob[place] Luna F wrote: Reason for CRM: Pt is calling in because she would like to reschedule her AWV.

## 2023-02-23 DIAGNOSIS — E119 Type 2 diabetes mellitus without complications: Secondary | ICD-10-CM | POA: Insufficient documentation

## 2023-02-23 DIAGNOSIS — Z7985 Long-term (current) use of injectable non-insulin antidiabetic drugs: Secondary | ICD-10-CM | POA: Insufficient documentation

## 2023-02-26 ENCOUNTER — Ambulatory Visit: Payer: 59 | Admitting: Nurse Practitioner

## 2023-02-26 DIAGNOSIS — E1169 Type 2 diabetes mellitus with other specified complication: Secondary | ICD-10-CM

## 2023-02-26 DIAGNOSIS — G2581 Restless legs syndrome: Secondary | ICD-10-CM

## 2023-02-26 DIAGNOSIS — F5101 Primary insomnia: Secondary | ICD-10-CM

## 2023-02-26 DIAGNOSIS — G2111 Neuroleptic induced parkinsonism: Secondary | ICD-10-CM

## 2023-02-26 DIAGNOSIS — F4310 Post-traumatic stress disorder, unspecified: Secondary | ICD-10-CM

## 2023-02-26 DIAGNOSIS — I152 Hypertension secondary to endocrine disorders: Secondary | ICD-10-CM

## 2023-02-26 DIAGNOSIS — J4540 Moderate persistent asthma, uncomplicated: Secondary | ICD-10-CM

## 2023-02-26 DIAGNOSIS — Z23 Encounter for immunization: Secondary | ICD-10-CM

## 2023-02-26 DIAGNOSIS — F3162 Bipolar disorder, current episode mixed, moderate: Secondary | ICD-10-CM

## 2023-02-26 DIAGNOSIS — F17210 Nicotine dependence, cigarettes, uncomplicated: Secondary | ICD-10-CM

## 2023-02-26 DIAGNOSIS — E1149 Type 2 diabetes mellitus with other diabetic neurological complication: Secondary | ICD-10-CM

## 2023-02-26 DIAGNOSIS — E119 Type 2 diabetes mellitus without complications: Secondary | ICD-10-CM

## 2023-02-27 ENCOUNTER — Other Ambulatory Visit: Payer: Self-pay | Admitting: Nurse Practitioner

## 2023-02-27 NOTE — Telephone Encounter (Signed)
Requested Prescriptions  Pending Prescriptions Disp Refills   rOPINIRole (REQUIP) 2 MG tablet [Pharmacy Med Name: rOPINIRole HCl 2 MG Oral Tablet] 90 tablet 0    Sig: TAKE 1 TABLET BY MOUTH AT BEDTIME     Neurology:  Parkinsonian Agents Passed - 02/27/2023 11:43 AM      Passed - Last BP in normal range    BP Readings from Last 1 Encounters:  11/23/22 109/64         Passed - Last Heart Rate in normal range    Pulse Readings from Last 1 Encounters:  11/23/22 98         Passed - Valid encounter within last 12 months    Recent Outpatient Visits           3 months ago Type 2 diabetes mellitus with obesity (HCC)   [place] [facility] [place], [place] T, NP   6 months ago Type 2 diabetes mellitus with obesity (HCC)   [place] [facility] [place], [place] T, NP   9 months ago Type 2 diabetes mellitus with obesity (HCC)   [place] [facility] [place], Corrie Dandy T, NP   1 year ago Medicare annual wellness visit, subsequent   [place] [facility] [place], Corrie Dandy T, NP   1 year ago Pruritus   [place], Dorie Rank, NP

## 2023-03-03 ENCOUNTER — Other Ambulatory Visit: Payer: Self-pay | Admitting: Nurse Practitioner

## 2023-03-05 NOTE — Telephone Encounter (Signed)
Requested Prescriptions  Pending Prescriptions Disp Refills   rOPINIRole (REQUIP) 2 MG tablet [Pharmacy Med Name: rOPINIRole HCl 2 MG Oral Tablet] 90 tablet 0    Sig: TAKE 1 TABLET BY MOUTH AT BEDTIME     Neurology:  Parkinsonian Agents Passed - 03/05/2023  1:16 PM      Passed - Last BP in normal range    BP Readings from Last 1 Encounters:  11/23/22 109/64         Passed - Last Heart Rate in normal range    Pulse Readings from Last 1 Encounters:  11/23/22 98         Passed - Valid encounter within last 12 months    Recent Outpatient Visits           3 months ago Type 2 diabetes mellitus with obesity (HCC)   [facility] [place], [place] T, NP   6 months ago Type 2 diabetes mellitus with obesity (HCC)   [place] [facility] [place], [place] T, NP   9 months ago Type 2 diabetes mellitus with obesity (HCC)   [place] [facility] [place], Corrie Dandy T, NP   1 year ago Medicare annual wellness visit, subsequent   [facility] [place], Corrie Dandy T, NP   1 year ago Pruritus   [place] [facility] [place], Dorie Rank, NP

## 2023-03-13 ENCOUNTER — Ambulatory Visit: Payer: Self-pay

## 2023-03-13 NOTE — Telephone Encounter (Signed)
  Chief Complaint: left ear Symptoms: pain varies but now is severe, runny nose, mild right ear pain  Frequency: 4 days  Pertinent Negatives: Patient denies H/A  Disposition: [] ED /[] Urgent Care (no appt availability in office) / [x] Appointment(In office/virtual)/ []  [facility]/ [] Home Care/ [] Refused Recommended Disposition /[] [facility]/ []  Follow-up with PCP Additional Notes:  Reason for Disposition  Earache  (Exceptions: brief ear pain of < 60 minutes duration, earache occurring during air travel  Answer Assessment - Initial Assessment Questions 1. LOCATION: Which ear is involved?     left 2. ONSET: When did the ear start hurting      4 days 3. SEVERITY: How bad is the pain?  (Scale 1-10; mild, moderate or severe)   - MILD (1-3): doesn't interfere with normal activities    - MODERATE (4-7): interferes with normal activities or awakens from sleep    - SEVERE (8-10): excruciating pain, unable to do any normal activities      Varies 8/10 4. URI SYMPTOMS: Do you have a runny nose or cough?     Runny nose off and on 5. FEVER: Do you have a fever? If Yes, ask: What is your temperature, how was it measured, and when did it start?     no 6. CAUSE: Have you been swimming recently?, How often do you use Q-TIPS?, Have you had any recent air travel or scuba diving?     sometimes 7. OTHER SYMPTOMS: Do you have any other symptoms? (e.g., headache, stiff neck, dizziness, vomiting, runny nose, decreased hearing)     Runny nose 8. PREGNANCY: Is there any chance you are pregnant? When was your last menstrual period?     N/a  Protocols used: [place]

## 2023-03-14 ENCOUNTER — Encounter: Payer: Self-pay | Admitting: Pediatrics

## 2023-03-14 ENCOUNTER — Ambulatory Visit: Payer: 59 | Admitting: Pediatrics

## 2023-03-14 VITALS — BP 119/73 | HR 73 | Temp 98.7°F | Resp 14 | Ht 62.01 in | Wt 161.8 lb

## 2023-03-14 DIAGNOSIS — H66003 Acute suppurative otitis media without spontaneous rupture of ear drum, bilateral: Secondary | ICD-10-CM | POA: Diagnosis not present

## 2023-03-14 DIAGNOSIS — Z1330 Encounter for screening examination for mental health and behavioral disorders, unspecified: Secondary | ICD-10-CM

## 2023-03-14 DIAGNOSIS — E669 Obesity, unspecified: Secondary | ICD-10-CM

## 2023-03-14 DIAGNOSIS — E1169 Type 2 diabetes mellitus with other specified complication: Secondary | ICD-10-CM | POA: Diagnosis not present

## 2023-03-14 DIAGNOSIS — Z7984 Long term (current) use of oral hypoglycemic drugs: Secondary | ICD-10-CM

## 2023-03-14 DIAGNOSIS — B379 Candidiasis, unspecified: Secondary | ICD-10-CM

## 2023-03-14 MED ORDER — AMOXICILLIN-POT CLAVULANATE 875-125 MG PO TABS: 1.0000 | ORAL_TABLET | Freq: Two times a day (BID) | ORAL | 0 refills | Status: AC

## 2023-03-14 MED ORDER — FLUCONAZOLE 150 MG PO TABS: 150.0000 mg | ORAL_TABLET | Freq: Once | ORAL | 0 refills | Status: DC

## 2023-03-14 MED ORDER — METFORMIN HCL ER 500 MG PO TB24: 500.0000 mg | ORAL_TABLET | Freq: Two times a day (BID) | ORAL | 4 refills | Status: DC

## 2023-03-14 NOTE — Progress Notes (Signed)
 Office Visit  BP 119/73 (BP Location: Left Arm, Patient Position: Sitting, Cuff Size: Normal)   Pulse 73   Temp 98.7 F (37.1 C) (Oral)   Resp 14   Ht 5' 2.01 (1.575 m)   Wt 161 lb 12.8 oz (73.4 kg)   LMP 11/26/2018 Comment: pt denies pregnancy-states no period in two years  SpO2 97%   BMI 29.59 kg/m    Subjective:    Patient ID: Vanessa Baker, female    DOB: [birth year], 48 y.o.   MRN: [id number]  HPI: Vanessa Baker is a 48 y.o. female  Chief Complaint  Patient presents with   Ear Pain    X 4 days with sharp pains. Right sided also starting to bother some.     Discussed the use of AI scribe software for clinical note transcription with the patient, who gave verbal consent to proceed.  History of Present Illness   Vanessa Baker is a 48 year old female who presents with left ear pain.  She has been experiencing throbbing pain in her left ear for four days. The pain began in the left ear and has started to spread. There are no hearing changes, ear discharge, or previous similar episodes. She has been using over-the-counter ear drops and taking ibuprofen for pain management. No fever, chills, or recent illnesses in her surroundings.  She has a history of depression and anxiety, managed by a provider named Jolene and psychiatry. She feels 'kind of down' recently, attributing it to current circumstances. She is on medication for these conditions and requires a refill on her prescriptions.  She takes metformin  for diabetes mellitus and needs a refill. She is unsure of another medication that needs refilling but plans to check and message for clarification.     Relevant past medical, surgical, family and social history reviewed and updated as indicated. Interim medical history since our last visit reviewed. Allergies and medications reviewed and updated.  ROS per HPI unless specifically indicated above     Objective:    BP 119/73 (BP Location: Left  Arm, Patient Position: Sitting, Cuff Size: Normal)   Pulse 73   Temp 98.7 F (37.1 C) (Oral)   Resp 14   Ht 5' 2.01 (1.575 m)   Wt 161 lb 12.8 oz (73.4 kg)   LMP 11/26/2018 Comment: pt denies pregnancy-states no period in two years  SpO2 97%   BMI 29.59 kg/m   Wt Readings from Last 3 Encounters:  03/14/23 161 lb 12.8 oz (73.4 kg)  11/23/22 168 lb 12.8 oz (76.6 kg)  08/22/22 175 lb (79.4 kg)     Physical Exam Constitutional:      Appearance: Normal appearance.  HENT:     Right Ear: Ear canal normal. Tympanic membrane is erythematous.     Left Ear: Ear canal normal. Tympanic membrane is erythematous and bulging.     Nose: No congestion.  Eyes:     Pupils: Pupils are equal, round, and reactive to light.  Cardiovascular:     Rate and Rhythm: Normal rate and regular rhythm.     Pulses: Normal pulses.     Heart sounds: Normal heart sounds.  Pulmonary:     Effort: Pulmonary effort is normal.     Breath sounds: Normal breath sounds.  Musculoskeletal:        General: Normal range of motion.  Skin:    Comments: Normal skin color  Neurological:     General: No focal deficit present.  Mental Status: She is alert. Mental status is at baseline.  Psychiatric:        Mood and Affect: Mood normal.        Behavior: Behavior normal.        Thought Content: Thought content normal.         03/14/2023   10:40 AM 11/23/2022    1:46 PM 08/22/2022    1:35 PM 05/23/2022    1:24 PM 02/12/2022    1:45 PM  Depression screen PHQ 2/9  Decreased Interest 1 2 2 2 2   Down, Depressed, Hopeless 1 2 2 2 1   PHQ - 2 Score 2 4 4 4 3   Altered sleeping 3 3 3 2 3   Tired, decreased energy 2 2 3 2 2   Change in appetite 2 2 2 2 1   Feeling bad or failure about yourself  2 1 1 2 1   Trouble concentrating 2 1 2 2 1   Moving slowly or fidgety/restless 1 1 1 2  0  Suicidal thoughts 0 1 1 0 0  PHQ-9 Score 14 15 17 16 11   Difficult doing work/chores Very difficult Somewhat difficult Somewhat difficult  Somewhat difficult Somewhat difficult       03/14/2023   10:41 AM 11/23/2022    1:46 PM 08/22/2022    1:36 PM 05/23/2022    1:25 PM  GAD 7 : Generalized Anxiety Score  Nervous, Anxious, on Edge 1 1 1 2   Control/stop worrying 2 1 1 2   Worry too much - different things 3 2 1 2   Trouble relaxing 2 1 1 2   Restless 1 1 2 1   Easily annoyed or irritable 2 1 1 1   Afraid - awful might happen 1 1 1  0  Total GAD 7 Score 12 8 8 10   Anxiety Difficulty Very difficult Somewhat difficult Somewhat difficult Somewhat difficult       Assessment & Plan:  Assessment & Plan   Non-recurrent acute suppurative otitis media of both ears without spontaneous rupture of tympanic membranes Yeast infection - after ABX Left ear pain with throbbing sensation for 4 days. Otoscopic examination reveals signs of infection. No hearing changes or otorrhea. No prior history of ear infections. -Prescribe oral antibiotic for 5 days. Concern about developing yeast infection from antibiotic treatment. -Prescribe Diflucan  to be taken after completion of antibiotic course.  -Advise to continue over-the-counter ear drops and ibuprofen for pain management. -Return if symptoms do not improve within a week. -     Amoxicillin -Pot Clavulanate; Take 1 tablet by mouth 2 (two) times daily for 5 days.  Dispense: 10 tablet; Refill: 0 -     Fluconazole ; Take 1 tablet (150 mg total) by mouth once for 1 dose.  Dispense: 1 tablet; Refill: 0  Type 2 diabetes mellitus with obesity (HCC)  Request for Metformin  refill. Another medication refill needed, but not specified during visit. -Refill Metformin  prescription. -     metFORMIN  HCl ER; Take 1 tablet (500 mg total) by mouth 2 (two) times daily with a meal.  Dispense: 180 tablet; Refill: 4  Encounter for behavioral health screening As part of their intake evaluation, the patient was screened for depression, anxiety.  PHQ9 SCORE 14, GAD7 SCORE 12. Screening results positive for tested  conditions. Ongoing management with Jolene. Recent increase in symptoms potentially related to current ear infection, per patient. -Continue current management. -Schedule follow-up with PCP in 4-6 weeks. -Consider medication adjustment if symptoms persist after resolution of ear infection.  Follow up plan:  Return if symptoms worsen or fail to improve.  Nayeli Calvert SHAUNNA NETT, MD

## 2023-03-14 NOTE — Patient Instructions (Signed)
 Augmentin  twice daily 5 days Diflucan  sent for after

## 2023-04-01 ENCOUNTER — Ambulatory Visit: Payer: 59 | Admitting: Nurse Practitioner

## 2023-04-01 DIAGNOSIS — F1411 Cocaine abuse, in remission: Secondary | ICD-10-CM

## 2023-04-01 DIAGNOSIS — E119 Type 2 diabetes mellitus without complications: Secondary | ICD-10-CM

## 2023-04-01 DIAGNOSIS — E1169 Type 2 diabetes mellitus with other specified complication: Secondary | ICD-10-CM

## 2023-04-01 DIAGNOSIS — F5101 Primary insomnia: Secondary | ICD-10-CM

## 2023-04-01 DIAGNOSIS — J4540 Moderate persistent asthma, uncomplicated: Secondary | ICD-10-CM

## 2023-04-01 DIAGNOSIS — E1159 Type 2 diabetes mellitus with other circulatory complications: Secondary | ICD-10-CM

## 2023-04-01 DIAGNOSIS — F17210 Nicotine dependence, cigarettes, uncomplicated: Secondary | ICD-10-CM

## 2023-04-01 DIAGNOSIS — E538 Deficiency of other specified B group vitamins: Secondary | ICD-10-CM

## 2023-04-01 DIAGNOSIS — E1149 Type 2 diabetes mellitus with other diabetic neurological complication: Secondary | ICD-10-CM

## 2023-04-01 DIAGNOSIS — F3162 Bipolar disorder, current episode mixed, moderate: Secondary | ICD-10-CM

## 2023-04-01 DIAGNOSIS — T43505A Adverse effect of unspecified antipsychotics and neuroleptics, initial encounter: Secondary | ICD-10-CM

## 2023-04-09 ENCOUNTER — Ambulatory Visit: Payer: 59

## 2023-04-09 ENCOUNTER — Ambulatory Visit: Payer: 59 | Admitting: Nurse Practitioner

## 2023-04-09 DIAGNOSIS — F17210 Nicotine dependence, cigarettes, uncomplicated: Secondary | ICD-10-CM

## 2023-04-09 DIAGNOSIS — G2111 Neuroleptic induced parkinsonism: Secondary | ICD-10-CM

## 2023-04-09 DIAGNOSIS — I152 Hypertension secondary to endocrine disorders: Secondary | ICD-10-CM

## 2023-04-09 DIAGNOSIS — G2581 Restless legs syndrome: Secondary | ICD-10-CM

## 2023-04-09 DIAGNOSIS — E1169 Type 2 diabetes mellitus with other specified complication: Secondary | ICD-10-CM

## 2023-04-09 DIAGNOSIS — F3162 Bipolar disorder, current episode mixed, moderate: Secondary | ICD-10-CM

## 2023-04-09 DIAGNOSIS — E538 Deficiency of other specified B group vitamins: Secondary | ICD-10-CM

## 2023-04-09 DIAGNOSIS — F1411 Cocaine abuse, in remission: Secondary | ICD-10-CM

## 2023-04-09 DIAGNOSIS — E1149 Type 2 diabetes mellitus with other diabetic neurological complication: Secondary | ICD-10-CM

## 2023-04-09 DIAGNOSIS — E119 Type 2 diabetes mellitus without complications: Secondary | ICD-10-CM

## 2023-04-09 DIAGNOSIS — F4310 Post-traumatic stress disorder, unspecified: Secondary | ICD-10-CM

## 2023-04-15 ENCOUNTER — Other Ambulatory Visit: Payer: Self-pay | Admitting: Pediatrics

## 2023-04-15 DIAGNOSIS — B379 Candidiasis, unspecified: Secondary | ICD-10-CM

## 2023-04-16 NOTE — Telephone Encounter (Signed)
 Requested medication (s) are due for refill today: No  Requested medication (s) are on the active medication list: No  Last refill:  03/14/23  Future visit scheduled: No  Notes to clinic:  Unable to refill due to no refill protocol for this medication.      Requested Prescriptions  Pending Prescriptions Disp Refills   fluconazole (DIFLUCAN) 150 MG tablet [Pharmacy Med Name: Fluconazole 150 MG Oral Tablet] 1 tablet 0    Sig: TAKE ONE TABLET BY MOUTH AS A ONE-TIME DOSE     Off-Protocol Failed - 04/16/2023  4:02 PM      Failed - Medication not assigned to a protocol, review manually.      Passed - Valid encounter within last 12 months    Recent Outpatient Visits           4 months ago Type 2 diabetes mellitus with obesity (HCC)   [facility] [place], [place] T, NP   7 months ago Type 2 diabetes mellitus with obesity (HCC)   [facility] [place], [place] T, NP   10 months ago Type 2 diabetes mellitus with obesity (HCC)   [place] [facility] [place], Corrie Dandy T, NP   1 year ago Medicare annual wellness visit, subsequent   [place] [facility] [place], Corrie Dandy T, NP   1 year ago Pruritus   [place] [facility] [place], Dorie Rank, NP

## 2023-04-24 ENCOUNTER — Ambulatory Visit: Admitting: Nurse Practitioner

## 2023-04-24 NOTE — Progress Notes (Deleted)
   LMP 11/26/2018 Comment: pt denies pregnancy-states no period in two years   Subjective:    Patient ID: Vanessa Baker, female    DOB: Jan 27, 1976, 48 y.o.   MRN: [id number]  HPI: Vanessa Baker is a 48 y.o. female  No chief complaint on file.  EAR PAIN Duration: {Blank single:19197::"days","weeks","months"} Involved ear(s): {Blank single:19197::"left","right","bilateral"} Severity:  {Blank single:19197::"mild","moderate","severe","1/10","2/10","3/10","4/10","5/10","6/10","7/10","8/10","9/10","10/10"}  Quality:  {Blank multiple:19196::"sharp","dull","aching","burning","cramping","ill-defined","itchy","pressure-like","pulling","shooting","sore","stabbing","tender","tearing","throbbing"} Fever: {Blank single:19197::"yes","no"} Otorrhea: {Blank single:19197::"yes","no"} Upper respiratory infection symptoms: {Blank single:19197::"yes","no"} Pruritus: {Blank single:19197::"yes","no"} Hearing loss: {Blank single:19197::"yes","no"} Water immersion {Blank single:19197::"yes","no"} Using Q-tips: {Blank single:19197::"yes","no"} Recurrent otitis media: {Blank single:19197::"yes","no"} Status: {Blank multiple:19196::"better","worse","stable","fluctuating"} Treatments attempted: {Blank single:19197::"none","pseudoephedrine"}  Relevant past medical, surgical, family and social history reviewed and updated as indicated. Interim medical history since our last visit reviewed. Allergies and medications reviewed and updated.  Review of Systems  Per HPI unless specifically indicated above     Objective:    LMP 11/26/2018 Comment: pt denies pregnancy-states no period in two years  Wt Readings from Last 3 Encounters:  03/14/23 161 lb 12.8 oz (73.4 kg)  11/23/22 168 lb 12.8 oz (76.6 kg)  08/22/22 175 lb (79.4 kg)    Physical Exam  Results for orders placed or performed in visit on 11/23/22  HgB A1c   Collection Time: 11/23/22  2:13 PM  Result Value Ref Range   Hgb A1c MFr  Bld 6.4 (H) 4.8 - 5.6 %   Est. average glucose Bld gHb Est-mCnc 137 mg/dL  Comprehensive metabolic panel   Collection Time: 11/23/22  2:13 PM  Result Value Ref Range   Glucose 141 (H) 70 - 99 mg/dL   BUN 13 6 - 24 mg/dL   Creatinine, Ser 9.62 0.57 - 1.00 mg/dL   eGFR 952 >84 XL/KGM/0.10   BUN/Creatinine Ratio 20 9 - 23   Sodium 140 134 - 144 mmol/L   Potassium 4.1 3.5 - 5.2 mmol/L   Chloride 101 96 - 106 mmol/L   CO2 22 20 - 29 mmol/L   Calcium 9.1 8.7 - 10.2 mg/dL   Total Protein 5.9 (L) 6.0 - 8.5 g/dL   Albumin 4.0 3.9 - 4.9 g/dL   Globulin, Total 1.9 1.5 - 4.5 g/dL   Bilirubin Total <2.7 0.0 - 1.2 mg/dL   Alkaline Phosphatase 99 44 - 121 IU/L   AST 14 0 - 40 IU/L   ALT 22 0 - 32 IU/L  Lipid Panel w/o Chol/HDL Ratio   Collection Time: 11/23/22  2:13 PM  Result Value Ref Range   Cholesterol, Total 113 100 - 199 mg/dL   Triglycerides 253 (H) 0 - 149 mg/dL   HDL 28 (L) >66 mg/dL   VLDL Cholesterol Cal 44 (H) 5 - 40 mg/dL   LDL Chol Calc (NIH) 41 0 - 99 mg/dL  TSH   Collection Time: 11/23/22  2:13 PM  Result Value Ref Range   TSH 0.721 0.450 - 4.500 uIU/mL  Prolactin   Collection Time: 11/23/22  2:13 PM  Result Value Ref Range   Prolactin 7.2 4.8 - 33.4 ng/mL      Assessment & Plan:   Problem List Items Addressed This Visit   None    Follow up plan: No follow-ups on file.

## 2023-05-03 ENCOUNTER — Other Ambulatory Visit: Payer: Self-pay | Admitting: Nurse Practitioner

## 2023-05-06 NOTE — Telephone Encounter (Signed)
 Requested Prescriptions  Pending Prescriptions Disp Refills   omega-3 acid ethyl esters (LOVAZA) 1 g capsule [Pharmacy Med Name: Omega-3-acid Ethyl Esters 1 GM Oral Capsule] 180 capsule 0    Sig: Take 1 capsule by mouth twice daily     Endocrinology:  Nutritional Agents - omega-3 acid ethyl esters Failed - 05/06/2023 10:06 AM      Failed - Lipid Panel in normal range within the last 12 months    Cholesterol, Total  Date Value Ref Range Status  11/23/2022 113 100 - 199 mg/dL Final   LDL Cholesterol (Calc)  Date Value Ref Range Status  12/19/2018 54 mg/dL (calc) Final    Comment:    Reference range: <100 . Desirable range <100 mg/dL for primary prevention;   <70 mg/dL for patients with CHD or diabetic patients  with > or = 2 CHD risk factors. Marland Kitchen LDL-C is now calculated using the Martin-Hopkins  calculation, which is a validated novel method providing  better accuracy than the Friedewald equation in the  estimation of LDL-C.  Horald Pollen et al. Lenox Ahr. 4098;119(14): 2061-2068  (http://education.[employer].com/faq/FAQ164)    LDL Chol Calc (NIH)  Date Value Ref Range Status  11/23/2022 41 0 - 99 mg/dL Final   HDL  Date Value Ref Range Status  11/23/2022 28 (L) >39 mg/dL Final   Triglycerides  Date Value Ref Range Status  11/23/2022 291 (H) 0 - 149 mg/dL Final         Passed - Valid encounter within last 12 months    Recent Outpatient Visits           1 month ago Non-recurrent acute suppurative otitis media of both ears without spontaneous rupture of tympanic membranes   [place] [facility] Jackolyn Confer, MD

## 2023-05-25 ENCOUNTER — Emergency Department
Admission: EM | Admit: 2023-05-25 | Discharge: 2023-05-25 | Disposition: A | Discharge status: Home / Self Care | Attending: Emergency Medicine | Admitting: Emergency Medicine

## 2023-05-25 ENCOUNTER — Other Ambulatory Visit: Payer: Self-pay

## 2023-05-25 ENCOUNTER — Emergency Department

## 2023-05-25 DIAGNOSIS — S199XXA Unspecified injury of neck, initial encounter: Secondary | ICD-10-CM | POA: Diagnosis not present

## 2023-05-25 DIAGNOSIS — Z041 Encounter for examination and observation following transport accident: Secondary | ICD-10-CM | POA: Diagnosis not present

## 2023-05-25 DIAGNOSIS — M79641 Pain in right hand: Secondary | ICD-10-CM | POA: Insufficient documentation

## 2023-05-25 DIAGNOSIS — M19041 Primary osteoarthritis, right hand: Secondary | ICD-10-CM | POA: Diagnosis not present

## 2023-05-25 DIAGNOSIS — S36119A Unspecified injury of liver, initial encounter: Secondary | ICD-10-CM | POA: Diagnosis present

## 2023-05-25 DIAGNOSIS — S301XXA Contusion of abdominal wall, initial encounter: Secondary | ICD-10-CM | POA: Diagnosis not present

## 2023-05-25 DIAGNOSIS — M542 Cervicalgia: Secondary | ICD-10-CM | POA: Diagnosis not present

## 2023-05-25 DIAGNOSIS — S0990XA Unspecified injury of head, initial encounter: Secondary | ICD-10-CM | POA: Diagnosis not present

## 2023-05-25 DIAGNOSIS — Y92481 Parking lot as the place of occurrence of the external cause: Secondary | ICD-10-CM | POA: Diagnosis not present

## 2023-05-25 DIAGNOSIS — S36112A Contusion of liver, initial encounter: Secondary | ICD-10-CM | POA: Diagnosis not present

## 2023-05-25 DIAGNOSIS — R1031 Right lower quadrant pain: Secondary | ICD-10-CM | POA: Diagnosis not present

## 2023-05-25 DIAGNOSIS — N281 Cyst of kidney, acquired: Secondary | ICD-10-CM | POA: Diagnosis not present

## 2023-05-25 DIAGNOSIS — M25512 Pain in left shoulder: Secondary | ICD-10-CM | POA: Insufficient documentation

## 2023-05-25 DIAGNOSIS — Z9049 Acquired absence of other specified parts of digestive tract: Secondary | ICD-10-CM | POA: Diagnosis not present

## 2023-05-25 DIAGNOSIS — S3991XA Unspecified injury of abdomen, initial encounter: Secondary | ICD-10-CM | POA: Diagnosis not present

## 2023-05-25 DIAGNOSIS — M47812 Spondylosis without myelopathy or radiculopathy, cervical region: Secondary | ICD-10-CM | POA: Diagnosis not present

## 2023-05-25 DIAGNOSIS — M4802 Spinal stenosis, cervical region: Secondary | ICD-10-CM | POA: Diagnosis not present

## 2023-05-25 LAB — COMPREHENSIVE METABOLIC PANEL WITH GFR
ALT: 18 U/L (ref 0–44)
AST: 15 U/L (ref 15–41)
Albumin: 3.5 g/dL (ref 3.5–5.0)
Alkaline Phosphatase: 80 U/L (ref 38–126)
Anion gap: 7 (ref 5–15)
BUN: 17 mg/dL (ref 6–20)
CO2: 25 mmol/L (ref 22–32)
Calcium: 9.2 mg/dL (ref 8.9–10.3)
Chloride: 105 mmol/L (ref 98–111)
Creatinine, Ser: 0.64 mg/dL (ref 0.44–1.00)
GFR, Estimated: >60 mL/min (ref >60)
Glucose, Bld: 99 mg/dL (ref 70–99)
Potassium: 4.2 mmol/L (ref 3.5–5.1)
Sodium: 137 mmol/L (ref 135–145)
Total Bilirubin: 0.7 mg/dL (ref 0.0–1.2)
Total Protein: 6.2 g/dL — ABNORMAL LOW (ref 6.5–8.1)

## 2023-05-25 LAB — CBC WITH DIFFERENTIAL/PLATELET
Abs Immature Granulocytes: 0.03 K/uL (ref 0.00–0.07)
Basophils Absolute: 0.2 K/uL — ABNORMAL HIGH (ref 0.0–0.1)
Basophils Relative: 2 %
Eosinophils Absolute: 0.4 K/uL (ref 0.0–0.5)
Eosinophils Relative: 4 %
HCT: 40.1 % (ref 36.0–46.0)
Hemoglobin: 12.8 g/dL (ref 12.0–15.0)
Immature Granulocytes: 0 %
Lymphocytes Relative: 34 %
Lymphs Abs: 3.2 K/uL (ref 0.7–4.0)
MCH: 26.9 pg (ref 26.0–34.0)
MCHC: 31.9 g/dL (ref 30.0–36.0)
MCV: 84.2 fL (ref 80.0–100.0)
Monocytes Absolute: 0.5 K/uL (ref 0.1–1.0)
Monocytes Relative: 6 %
Neutro Abs: 4.9 K/uL (ref 1.7–7.7)
Neutrophils Relative %: 54 %
Platelets: 337 K/uL (ref 150–400)
RBC: 4.76 MIL/uL (ref 3.87–5.11)
RDW: 14.5 % (ref 11.5–15.5)
WBC: 9.2 K/uL (ref 4.0–10.5)
nRBC: 0 % (ref 0.0–0.2)

## 2023-05-25 MED ORDER — MORPHINE SULFATE (PF) 4 MG/ML IV SOLN
4.0000 mg | Freq: Once | INTRAVENOUS | Status: AC
  Administered 2023-05-25: 4 mg via INTRAVENOUS
  Filled 2023-05-25: qty 1

## 2023-05-25 MED ORDER — HYDROCODONE-ACETAMINOPHEN 5-325 MG PO TABS: 1.0000 | ORAL_TABLET | Freq: Three times a day (TID) | ORAL | 0 refills | Status: AC | PRN

## 2023-05-25 MED ORDER — IOHEXOL 300 MG/ML  SOLN
80.0000 mL | Freq: Once | INTRAMUSCULAR | Status: AC | PRN
  Administered 2023-05-25: 80 mL via INTRAVENOUS
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
## 2023-05-27 ENCOUNTER — Other Ambulatory Visit: Payer: Self-pay | Admitting: Nurse Practitioner

## 2023-05-27 NOTE — Telephone Encounter (Signed)
 Requested Prescriptions  Pending Prescriptions Disp Refills   albuterol  (VENTOLIN  HFA) 108 (90 Base) MCG/ACT inhaler [Pharmacy Med Name: Albuterol  Sulfate HFA 108 (90 Base) MCG/ACT Inhalation Aerosol Solution] 18 g 2    Sig: INHALE 2 PUFFS BY MOUTH EVERY 6 HOURS AS NEEDED FOR WHEEZING AND FOR SHORTNESS OF BREATH     Pulmonology:  Beta Agonists 2 Failed - 05/27/2023  5:20 PM      Failed - Last BP in normal range    BP Readings from Last 1 Encounters:  03/14/23 119/73         Passed - Last Heart Rate in normal range    Pulse Readings from Last 1 Encounters:  03/14/23 73         Passed - Valid encounter within last 12 months    Recent Outpatient Visits           2 months ago Non-recurrent acute suppurative otitis media of both ears without spontaneous rupture of tympanic membranes   [place] [facility] Hadassah Letters, MD

## 2023-06-12 ENCOUNTER — Other Ambulatory Visit: Payer: Self-pay | Admitting: Nurse Practitioner

## 2023-06-12 DIAGNOSIS — G2581 Restless legs syndrome: Secondary | ICD-10-CM

## 2023-06-14 NOTE — Telephone Encounter (Signed)
 Requested medications are due for refill today.  yes  Requested medications are on the active medications list.  yes  Last refill. 05/23/2022 3270 4 rf  Future visit scheduled.   no  Notes to clinic.  Pt was seen recently for an acute visit. Pt was last seen by PCP 11/23/2022. Pt has cancelled several appointments since then. Please advise.    Requested Prescriptions  Pending Prescriptions Disp Refills   baclofen  (LIORESAL ) 10 MG tablet [Pharmacy Med Name: Baclofen  10 MG Oral Tablet] 90 tablet 0    Sig: Take 1 tablet by mouth three times daily as needed for muscle spasm     Analgesics:  Muscle Relaxants - baclofen  Passed - 06/14/2023  9:36 AM      Passed - Cr in normal range and within 180 days    Creat  Date Value Ref Range Status  12/19/2018 0.68 0.50 - 1.10 mg/dL Final   Creatinine, Ser  Date Value Ref Range Status  05/25/2023 0.64 0.44 - 1.00 mg/dL Final         Passed - eGFR is 30 or above and within 180 days    GFR, Est African American  Date Value Ref Range Status  12/19/2018 123 > OR = 60 mL/min/1.34m2 Final   GFR calc Af Amer  Date Value Ref Range Status  01/13/2020 108 >59 mL/min/1.73 Final    Comment:    **In accordance with recommendations from the NKF-ASN Task force,**   [facility] is in the process of updating its eGFR calculation to the   2021 CKD-EPI creatinine equation that estimates kidney function   without a race variable.    GFR, Est Non African American  Date Value Ref Range Status  12/19/2018 106 > OR = 60 mL/min/1.6m2 Final   GFR, Estimated  Date Value Ref Range Status  05/25/2023 >60 >60 mL/min Final    Comment:    (NOTE) Calculated using the CKD-EPI Creatinine Equation (2021)    eGFR  Date Value Ref Range Status  11/23/2022 109 >59 mL/min/1.73 Final         Passed - Valid encounter within last 6 months    Recent Outpatient Visits           3 months ago Non-recurrent acute suppurative otitis media of both ears without spontaneous  rupture of tympanic membranes   [place] [facility] Hadassah Letters, MD

## 2023-06-17 NOTE — Telephone Encounter (Signed)
 Appt scheduled

## 2023-06-23 NOTE — Patient Instructions (Addendum)
Please call to schedule your mammogram and/or bone density: [facility] at [facility]  Address: [address], [place], [place] [address] Phone: [phone]  [facility] at [facility] [address] [place],  [place]  [address] Phone: [phone]   Diabetes Mellitus and Exercise Regular exercise is important for your health, especially if you have diabetes mellitus. Exercise is not just about losing weight. It can also help you increase muscle strength and bone density and reduce body fat and stress. This can help your level of endurance and make you more fit and flexible. Why should I exercise if I have diabetes? Exercise has many benefits for people with diabetes. It can: Help lower and control your blood sugar (glucose). Help your body respond better and become more sensitive to the hormone insulin. Reduce how much insulin your body needs. Lower your risk for heart disease by: Lowering how much "bad" cholesterol and triglycerides you have in your body. Increasing how much "good" cholesterol you have in your body. Lowering your blood pressure. Lowering your blood glucose levels. What is my activity plan? Your health care provider or an expert trained in diabetes care (certified diabetes educator) can help you make an activity plan. This plan can help you find the type of exercise that works for you. It may also tell you how often to exercise and for how long. Be sure to: Get at least 150 minutes of medium-intensity or high-intensity exercise each week. This may involve brisk walking, biking, or water aerobics. Do stretching and strengthening exercises at least 2 times a week. This may involve yoga or weight lifting. Spread out your activity over at least 3 days of the week. Get some form of physical activity each day. Do not go more than 2 days in a row without some kind of activity. Avoid being inactive for more than 30 minutes at a  time. Take frequent breaks to walk or stretch. Choose activities that you enjoy. Set goals that you know you can accomplish. Start slowly and increase the intensity of your exercise over time. How do I manage my diabetes during exercise?  Monitor your blood glucose Check your blood glucose before and after you exercise. If your blood glucose is 240 mg/dL (84.1 mmol/L) or higher before you exercise, check your urine for ketones. These are chemicals created by the liver. If you have ketones in your urine, do not exercise until your blood glucose returns to normal. If your blood glucose is 100 mg/dL (5.6 mmol/L) or lower, eat a snack that has 15-20 grams of carbohydrate in it. Check your blood glucose 15 minutes after the snack to make sure that your level is above 100 mg/dL (5.6 mmol/L) before you start to exercise. Your risk for low blood glucose (hypoglycemia) goes up during and after exercise. Know the symptoms of this condition and how to treat it. Follow these instructions at home: Keep a carbohydrate snack on hand for use before, during, and after exercise. This can help prevent or treat hypoglycemia. Avoid injecting insulin into parts of your body that are going to be used during exercise. This may include: Your arms, when you are going to play tennis. Your legs, when you are about to go jogging. Keep track of your exercise habits. This can help you and your health care provider watch and adjust your activity plan. Write down: What you eat before and after you exercise. Blood glucose levels before and after you exercise.  The type and amount of exercise you do. Talk to your health care provider before you start a new activity. They may need to: Make sure that the activity is safe for you. Adjust your insulin, other medicines, and food that you eat. Drink water while you exercise. This can stop you from losing too much water (dehydration). It can also prevent problems caused by having a lot  of heat in your body (heat stroke). Where to find more information American Diabetes Association: diabetes.org Association of Diabetes Care & Education Specialists: diabeteseducator.org This information is not intended to replace advice given to you by your health care provider. Make sure you discuss any questions you have with your health care provider. Document Revised: 07/12/2021 Document Reviewed: 07/12/2021 Elsevier Patient Education  2024 [employer].

## 2023-06-26 ENCOUNTER — Other Ambulatory Visit: Payer: Self-pay | Admitting: Nurse Practitioner

## 2023-06-26 ENCOUNTER — Ambulatory Visit (INDEPENDENT_AMBULATORY_CARE_PROVIDER_SITE_OTHER): Admitting: Nurse Practitioner

## 2023-06-26 ENCOUNTER — Encounter: Payer: Self-pay | Admitting: Nurse Practitioner

## 2023-06-26 VITALS — BP 122/70 | HR 98 | Temp 99.0°F | Ht 62.0 in | Wt 161.4 lb

## 2023-06-26 DIAGNOSIS — G2111 Neuroleptic induced parkinsonism: Secondary | ICD-10-CM

## 2023-06-26 DIAGNOSIS — E1169 Type 2 diabetes mellitus with other specified complication: Secondary | ICD-10-CM | POA: Diagnosis not present

## 2023-06-26 DIAGNOSIS — F4310 Post-traumatic stress disorder, unspecified: Secondary | ICD-10-CM

## 2023-06-26 DIAGNOSIS — E1159 Type 2 diabetes mellitus with other circulatory complications: Secondary | ICD-10-CM | POA: Diagnosis not present

## 2023-06-26 DIAGNOSIS — J4540 Moderate persistent asthma, uncomplicated: Secondary | ICD-10-CM

## 2023-06-26 DIAGNOSIS — E1149 Type 2 diabetes mellitus with other diabetic neurological complication: Secondary | ICD-10-CM | POA: Diagnosis not present

## 2023-06-26 DIAGNOSIS — E538 Deficiency of other specified B group vitamins: Secondary | ICD-10-CM | POA: Diagnosis not present

## 2023-06-26 DIAGNOSIS — E119 Type 2 diabetes mellitus without complications: Secondary | ICD-10-CM

## 2023-06-26 DIAGNOSIS — F3162 Bipolar disorder, current episode mixed, moderate: Secondary | ICD-10-CM

## 2023-06-26 DIAGNOSIS — I152 Hypertension secondary to endocrine disorders: Secondary | ICD-10-CM

## 2023-06-26 DIAGNOSIS — F17210 Nicotine dependence, cigarettes, uncomplicated: Secondary | ICD-10-CM

## 2023-06-26 DIAGNOSIS — F1411 Cocaine abuse, in remission: Secondary | ICD-10-CM

## 2023-06-26 DIAGNOSIS — Z7984 Long term (current) use of oral hypoglycemic drugs: Secondary | ICD-10-CM

## 2023-06-26 DIAGNOSIS — T43505A Adverse effect of unspecified antipsychotics and neuroleptics, initial encounter: Secondary | ICD-10-CM

## 2023-06-26 DIAGNOSIS — E785 Hyperlipidemia, unspecified: Secondary | ICD-10-CM | POA: Diagnosis not present

## 2023-06-26 LAB — MICROALBUMIN, URINE WAIVED
Creatinine, Urine Waived: 300 mg/dL (ref 10–300)
Microalb, Ur Waived: 80 mg/L — ABNORMAL HIGH (ref 0–19)
Microalb/Creat Ratio: <30 mg/g (ref <30)

## 2023-06-26 LAB — BAYER DCA HB A1C WAIVED: HB A1C (BAYER DCA - WAIVED): 5.7 % — ABNORMAL HIGH (ref 4.8–5.6)

## 2023-06-26 MED ORDER — ACCU-CHEK SOFTCLIX LANCETS MISC
12 refills | Status: DC
Start: 2023-06-26 — End: 2023-06-28

## 2023-06-26 MED ORDER — OMEGA-3-ACID ETHYL ESTERS 1 G PO CAPS: 1.0000 | ORAL_CAPSULE | Freq: Two times a day (BID) | ORAL | 2 refills | Status: AC

## 2023-06-26 MED ORDER — OZEMPIC (0.25 OR 0.5 MG/DOSE) 2 MG/3ML ~~LOC~~ SOPN: PEN_INJECTOR | SUBCUTANEOUS | 4 refills | Status: DC

## 2023-06-26 MED ORDER — ACCU-CHEK GUIDE W/DEVICE KIT: PACK | 2 refills | Status: AC

## 2023-06-26 MED ORDER — OMEPRAZOLE 20 MG PO CPDR: 20.0000 mg | DELAYED_RELEASE_CAPSULE | Freq: Every day | ORAL | 4 refills | Status: AC

## 2023-06-26 MED ORDER — ALBUTEROL SULFATE HFA 108 (90 BASE) MCG/ACT IN AERS: INHALATION_SPRAY | RESPIRATORY_TRACT | 2 refills | Status: DC

## 2023-06-26 MED ORDER — BREZTRI AEROSPHERE 160-9-4.8 MCG/ACT IN AERO: 2.0000 | INHALATION_SPRAY | Freq: Two times a day (BID) | RESPIRATORY_TRACT | 11 refills | Status: AC

## 2023-06-26 MED ORDER — ATORVASTATIN CALCIUM 40 MG PO TABS: 40.0000 mg | ORAL_TABLET | Freq: Every day | ORAL | 4 refills | Status: AC

## 2023-06-26 MED ORDER — ACCU-CHEK GUIDE TEST VI STRP: ORAL_STRIP | 4 refills | Status: DC

## 2023-06-26 MED ORDER — BACLOFEN 10 MG PO TABS
10.0000 mg | ORAL_TABLET | Freq: Three times a day (TID) | ORAL | 2 refills | Status: DC | PRN
Start: 2023-06-26 — End: 2023-10-08

## 2023-06-26 MED ORDER — ROPINIROLE HCL 2 MG PO TABS: 2.0000 mg | ORAL_TABLET | Freq: Every day | ORAL | 2 refills | Status: AC

## 2023-06-26 NOTE — Assessment & Plan Note (Signed)
 Chronic, ongoing.  Denies SI/HI.  Continue current medication regimen as prescribed by psychiatry + continues visits.  Will attempt to get records.
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
## 2023-06-26 NOTE — Assessment & Plan Note (Signed)
Refer to diabetes with obesity plan of care.  Continue collaboration with neurology and current Gabapentin dosing. 

## 2023-06-26 NOTE — Assessment & Plan Note (Signed)
 Continues to remain sustained from cocaine use, recommend continued cessation.

## 2023-06-26 NOTE — Assessment & Plan Note (Signed)
 Refer to diabetes with obesity plan of care.

## 2023-06-26 NOTE — Assessment & Plan Note (Signed)
 Chronic, ongoing.  Recommend cessation of smoking.  Will continue Albuterol  as needed and maintain Breztri  daily as is offering benefit. Spirometry next visit.  Start lung CT screening at age 48.  Would benefit from no ACE in future and switch to ARB due to suspected COPD.

## 2023-06-26 NOTE — Assessment & Plan Note (Signed)
 Chronic, ongoing.  A1c 5.7% today. Urine ALB 80 May 2025.  Will stop Metformin  XR and start Ozempic per patient request.  Educated her on this medication and side effects. No family history of thyroid  cancer (MTC, MEN 2, thyroid  cell tumors) or pancreatitis.   .  Recommend she monitor BS at home every morning, fasting, and document for provider + bring to visits.  Focus on diabetic diet.   - Eye and foot exam up to date - Statin on board - Needs ARB low dose in future if can tolerate, low dose ACE caused hypotension - Vaccines up to date - refuses flu

## 2023-06-26 NOTE — Assessment & Plan Note (Signed)
Ongoing.  Continue Vitamin B12 supplement daily.  Recommend she take this daily, no missing doses.  Check B12.
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
## 2023-06-26 NOTE — Assessment & Plan Note (Signed)
I have recommended complete cessation of tobacco use. I have discussed various options available for assistance with tobacco cessation including over the counter methods (Nicotine gum, patch and lozenges). We also discussed prescription options (Chantix, Nicotine Inhaler / Nasal Spray). The patient is not interested in pursuing any prescription tobacco cessation options at this time.  Start lung screening at age 48.

## 2023-06-26 NOTE — Assessment & Plan Note (Addendum)
 Chronic, ongoing.  Continue Atorvastatin 40 MG daily and adjust as needed, refills sent.  Lipid panel today.

## 2023-06-26 NOTE — Assessment & Plan Note (Signed)
 Chronic, stable.  BP at goal on check today.  Remain off Lisinopril  for now, consider low dose ARB in future for kidney protection if needed + suspect underlying COPD -- avoid hypotension.  Urine ALB 80 May 2025.  A1c 5.7%.  Recommend she monitor BP at home daily and document + bring to visits.  Focus on DASH diet.  LABS: BMP.

## 2023-06-26 NOTE — Assessment & Plan Note (Signed)
Chronic, stable.  Continue collaboration with neurology as needed.  Continue current medication regimen.  Recent note and medication changes reviewed.

## 2023-06-27 ENCOUNTER — Ambulatory Visit: Payer: Self-pay | Admitting: Nurse Practitioner

## 2023-06-27 LAB — LIPID PANEL W/O CHOL/HDL RATIO
Cholesterol, Total: 106 mg/dL (ref 100–199)
HDL: 28 mg/dL — ABNORMAL LOW (ref >39)
LDL Chol Calc (NIH): 40 mg/dL (ref 0–99)
Triglycerides: 245 mg/dL — ABNORMAL HIGH (ref 0–149)
VLDL Cholesterol Cal: 38 mg/dL (ref 5–40)

## 2023-06-27 LAB — BASIC METABOLIC PANEL WITH GFR
BUN/Creatinine Ratio: 13 (ref 9–23)
BUN: 10 mg/dL (ref 6–24)
CO2: 21 mmol/L (ref 20–29)
Calcium: 9.1 mg/dL (ref 8.7–10.2)
Chloride: 99 mmol/L (ref 96–106)
Creatinine, Ser: 0.76 mg/dL (ref 0.57–1.00)
Glucose: 144 mg/dL — ABNORMAL HIGH (ref 70–99)
Potassium: 4.2 mmol/L (ref 3.5–5.2)
Sodium: 137 mmol/L (ref 134–144)
eGFR: 96 mL/min/{1.73_m2} (ref >59)

## 2023-06-27 LAB — VITAMIN B12: Vitamin B-12: 1171 pg/mL (ref 232–1245)

## 2023-06-27 NOTE — Progress Notes (Signed)
 Contacted via MyChart   Good day Finnley, your labs have returned and overall remain stable with exception of triglycerides remaining a bit elevated.  Continue all current medications. Any questions? Keep being amazing!!  Thank you for allowing me to participate in your care.  I appreciate you. Kindest regards, Marianita Botkin

## 2023-06-28 NOTE — Telephone Encounter (Signed)
 Requested medication (s) are due for refill today: no  Requested medication (s) are on the active medication list: yes  Last refill:  06/26/23  Future visit scheduled:   Notes to clinic:  See pharmacy request.    Requested Prescriptions  Pending Prescriptions Disp Refills   Accu-Chek Softclix Lancets lancets [Pharmacy Med Name: SOFTCLIX LANCETS    MIS] 100 each 12    Sig: USE AS DIRECTED     Endocrinology: Diabetes - Testing Supplies Passed - 06/28/2023  8:12 AM      Passed - Valid encounter within last 12 months    Recent Outpatient Visits           2 days ago Type 2 diabetes mellitus with obesity (HCC)   [place] [facility] [place], [place]lman T, NP   3 months ago Non-recurrent acute suppurative otitis media of both ears without spontaneous rupture of tympanic membranes   [place] [facility] [place] Letters, MD

## 2023-06-28 NOTE — Telephone Encounter (Signed)
 Requested medication (s) are due for refill today: no  Requested medication (s) are on the active medication list: yes  Last refill:  06/26/23  Future visit scheduled:   Notes to clinic:  See pharmacy request.    Requested Prescriptions  Pending Prescriptions Disp Refills   ACCU-CHEK GUIDE TEST test strip [Pharmacy Med Name: ACCU-CHEK GUIDE TES] 100 each 4    Sig: USE AS DIRECTED     There is no refill protocol information for this order

## 2023-07-20 NOTE — Patient Instructions (Signed)

## 2023-07-24 ENCOUNTER — Encounter: Payer: Self-pay | Admitting: Nurse Practitioner

## 2023-07-24 ENCOUNTER — Telehealth (INDEPENDENT_AMBULATORY_CARE_PROVIDER_SITE_OTHER): Admitting: Nurse Practitioner

## 2023-07-24 DIAGNOSIS — E1169 Type 2 diabetes mellitus with other specified complication: Secondary | ICD-10-CM

## 2023-07-24 DIAGNOSIS — E669 Obesity, unspecified: Secondary | ICD-10-CM | POA: Diagnosis not present

## 2023-07-24 MED ORDER — OZEMPIC (0.25 OR 0.5 MG/DOSE) 2 MG/3ML ~~LOC~~ SOPN: 0.5000 mg | PEN_INJECTOR | SUBCUTANEOUS | 4 refills | Status: DC

## 2023-07-24 NOTE — Progress Notes (Signed)
 LMP 11/26/2018 Comment: pt denies pregnancy-states no period in two years   Subjective:    Patient ID: Vanessa Baker, female    DOB: September 19, 1975, 48 y.o.   MRN: [id number]  HPI: Vanessa Baker is a 48 y.o. female  Chief Complaint  Patient presents with   Diabetes   Virtual Visit via Video Note  I connected with Vanessa Baker on 07/24/23 at  3:00 PM EDT by a video enabled telemedicine application and verified that I am speaking with the correct person using two identifiers.  Location: Patient: home Provider: work   I discussed the limitations of evaluation and management by telemedicine and the availability of in person appointments. The patient expressed understanding and agreed to proceed.  I discussed the assessment and treatment plan with the patient. The patient was provided an opportunity to ask questions and all were answered. The patient agreed with the plan and demonstrated an understanding of the instructions.   The patient was advised to call back or seek an in-person evaluation if the symptoms worsen or if the condition fails to improve as anticipated.  I provided 25 minutes of non-face-to-face time during this encounter.   Syreeta Figler T Baltazar Pekala, NP   DIABETES Recent A1c 5.7%.  We stopped Metformin  XR and started Ozempic  per patient request, as Metformin  causing GI issues often.  Loves the Ozempic .  Reducing appetite and has lost some weight with this.  No ADRs with this.  Is at 157 lbs. Hypoglycemic episodes:no Polydipsia/polyuria: no Visual disturbance: no Chest pain: no Paresthesias: no Glucose Monitoring: no  Accucheck frequency: Not Checking  Fasting glucose:  Post prandial:  Evening:  Before meals: Taking Insulin ?: no  Long acting insulin :  Short acting insulin : Blood Pressure Monitoring: not checking Retinal Examination: Not up to Date Foot Exam: Up to Date Diabetic Education: Not Completed Pneumovax: Up to Date Influenza:  Refuses Aspirin : no   Relevant past medical, surgical, family and social history reviewed and updated as indicated. Interim medical history since our last visit reviewed. Allergies and medications reviewed and updated.  Review of Systems  Constitutional:  Negative for activity change, appetite change, diaphoresis, fatigue and fever.  Respiratory:  Negative for cough, chest tightness and shortness of breath.   Cardiovascular:  Negative for chest pain, palpitations and leg swelling.  Gastrointestinal: Negative.   Endocrine: Negative for cold intolerance, heat intolerance, polydipsia, polyphagia and polyuria.  Neurological: Negative.   Psychiatric/Behavioral: Negative.      Per HPI unless specifically indicated above     Objective:    LMP 11/26/2018 Comment: pt denies pregnancy-states no period in two years  Wt Readings from Last 3 Encounters:  06/26/23 161 lb 6.4 oz (73.2 kg)  03/14/23 161 lb 12.8 oz (73.4 kg)  11/23/22 168 lb 12.8 oz (76.6 kg)    Physical Exam Vitals and nursing note reviewed.  Constitutional:      General: She is awake. She is not in acute distress.    Appearance: She is well-developed. She is not ill-appearing.  HENT:     Head: Normocephalic.     Right Ear: Hearing normal.     Left Ear: Hearing normal.   Eyes:     General: Lids are normal.        Right eye: No discharge.        Left eye: No discharge.     Conjunctiva/sclera: Conjunctivae normal.   Pulmonary:     Effort: Pulmonary effort is normal. No accessory muscle usage or  respiratory distress.   Musculoskeletal:     Cervical back: Normal range of motion.   Neurological:     Mental Status: She is alert and oriented to person, place, and time.   Psychiatric:        Attention and Perception: Attention normal.        Mood and Affect: Mood normal.        Behavior: Behavior normal. Behavior is cooperative.        Thought Content: Thought content normal.        Judgment: Judgment normal.      Results for orders placed or performed in visit on 06/26/23  Bayer DCA Hb A1c Waived   Collection Time: 06/26/23  3:46 PM  Result Value Ref Range   HB A1C (BAYER DCA - WAIVED) 5.7 (H) 4.8 - 5.6 %  Microalbumin, Urine Waived   Collection Time: 06/26/23  3:46 PM  Result Value Ref Range   Microalb, Ur Waived 80 (H) 0 - 19 mg/L   Creatinine, Urine Waived 300 10 - 300 mg/dL   Microalb/Creat Ratio <30 <30 mg/g  Basic metabolic panel with GFR   Collection Time: 06/26/23  3:48 PM  Result Value Ref Range   Glucose 144 (H) 70 - 99 mg/dL   BUN 10 6 - 24 mg/dL   Creatinine, Ser 4.78 0.57 - 1.00 mg/dL   eGFR 96 >29 FA/OZH/0.86   BUN/Creatinine Ratio 13 9 - 23   Sodium 137 134 - 144 mmol/L   Potassium 4.2 3.5 - 5.2 mmol/L   Chloride 99 96 - 106 mmol/L   CO2 21 20 - 29 mmol/L   Calcium  9.1 8.7 - 10.2 mg/dL  Lipid Panel w/o Chol/HDL Ratio   Collection Time: 06/26/23  3:48 PM  Result Value Ref Range   Cholesterol, Total 106 100 - 199 mg/dL   Triglycerides 578 (H) 0 - 149 mg/dL   HDL 28 (L) >46 mg/dL   VLDL Cholesterol Cal 38 5 - 40 mg/dL   LDL Chol Calc (NIH) 40 0 - 99 mg/dL  Vitamin B12   Collection Time: 06/26/23  3:48 PM  Result Value Ref Range   Vitamin B-12 1,171 232 - 1,245 pg/mL      Assessment & Plan:   Problem List Items Addressed This Visit       Endocrine   Type 2 diabetes mellitus with obesity (HCC) - Primary   Chronic, ongoing.  A1c 5.7% in May 2025. Urine ALB 80 May 2025.  Continue Ozempic  at 0.5 MG at this time, is tolerating and finding benefit.  Educated her on this medication and side effects. No family history of thyroid  cancer (MTC, MEN 2, thyroid  cell tumors) or pancreatitis.   .Recommend she monitor BS at home every morning, fasting, and document for provider + bring to visits.  Focus on diabetic diet.   - Eye exam needed and foot exam up to date - Statin on board - Needs ARB low dose in future if can tolerate, low dose ACE caused hypotension - Vaccines  up to date - refuses flu      Relevant Medications   Semaglutide ,0.25 or 0.5MG /DOS, (OZEMPIC , 0.25 OR 0.5 MG/DOSE,) 2 MG/3ML SOPN     Follow up plan: Return in about 3 months (around 10/24/2023) for T2DM, HTN/HLD, MOOD, ASTHMA, GERD.

## 2023-07-24 NOTE — Assessment & Plan Note (Signed)
 Chronic, ongoing.  A1c 5.7% in May 2025. Urine ALB 80 May 2025.  Continue Ozempic  at 0.5 MG at this time, is tolerating and finding benefit.  Educated her on this medication and side effects. No family history of thyroid  cancer (MTC, MEN 2, thyroid  cell tumors) or pancreatitis.   .Recommend she monitor BS at home every morning, fasting, and document for provider + bring to visits.  Focus on diabetic diet.   - Eye exam needed and foot exam up to date - Statin on board - Needs ARB low dose in future if can tolerate, low dose ACE caused hypotension - Vaccines up to date - refuses flu

## 2023-07-25 NOTE — Progress Notes (Signed)
 Unable to leave message mailbox full.

## 2023-07-26 NOTE — Progress Notes (Signed)
 Appointment has been made

## 2023-08-19 ENCOUNTER — Telehealth: Payer: Self-pay | Admitting: Nurse Practitioner

## 2023-08-19 MED ORDER — OZEMPIC (0.25 OR 0.5 MG/DOSE) 2 MG/3ML ~~LOC~~ SOPN: 0.5000 mg | PEN_INJECTOR | SUBCUTANEOUS | 4 refills | Status: AC

## 2023-08-19 NOTE — Telephone Encounter (Signed)
 Called and notified patient that medication has been sent in for her.

## 2023-08-19 NOTE — Telephone Encounter (Signed)
 Copied from CRM [id number]. Topic: Clinical - Prescription Issue >> Aug 19, 2023  1:03 PM Vanessa Baker wrote: Reason for CRM: Patient states her understanding that after her fist month of being on Semaglutide ,0.25 or 0.5MG /DOS, (OZEMPIC , 0.25 OR 0.5 MG/DOSE,) 2 MG/3ML SOPN, a new prescription was supposed to be written for just 0.5 MG. Pharmacy advised he to call to have the script updated.  Patient can be reached at [phone]

## 2023-08-19 NOTE — Telephone Encounter (Signed)
 Can a prescription for the 0.5 mg be sent in for the patient?

## 2023-09-03 ENCOUNTER — Encounter: Payer: Self-pay | Admitting: Emergency Medicine

## 2023-09-10 ENCOUNTER — Ambulatory Visit: Admitting: Emergency Medicine

## 2023-09-10 VITALS — Ht 62.0 in | Wt 153.0 lb

## 2023-09-10 DIAGNOSIS — Z0000 Encounter for general adult medical examination without abnormal findings: Secondary | ICD-10-CM | POA: Diagnosis not present

## 2023-09-10 DIAGNOSIS — Z1231 Encounter for screening mammogram for malignant neoplasm of breast: Secondary | ICD-10-CM

## 2023-09-10 NOTE — Patient Instructions (Signed)
 Vanessa Baker , Thank you for taking time out of your busy schedule to complete your Annual Wellness Visit with me. I enjoyed our conversation and look forward to speaking with you again next year. I, as well as your care team,  appreciate your ongoing commitment to your health goals. Please review the following plan we discussed and let me know if I can assist you in the future. Your Game plan/ To Do List    Referrals: None   Follow up Visits: We will see or speak with you next year for your Next Medicare AWV with our clinical staff Have you seen your provider in the last 6 months (3 months if uncontrolled diabetes)? Yes  Clinician Recommendations: Call to schedule a diabetic eye exam at your earliest convenience with [facility] (phone# [phone]) You should get these every year. Get the flu vaccine in the fall. Get the shingles vaccines after the age of 72.  Aim for 30 minutes of exercise or brisk walking, 6-8 glasses of water, and 5 servings of fruits and vegetables each day.   Please call to schedule your mammogram:  [facility] at [facility] Address: [address], [place], [place] Phone: [phone]  [facility] [address], [address] [place], [place] [address] Phone: [phone]        This is a list of the screenings recommended for you:  Health Maintenance  Topic Date Due   Hepatitis B Vaccine (1 of 3 - 19+ 3-dose series) Never done   Eye exam for diabetics  09/08/2022   COVID-19 Vaccine (5 - 2024-25 season) 10/07/2022   Flu Shot  09/06/2023   Mammogram  06/25/2024*   Colon Cancer Screening  06/25/2024*   Pap with HPV screening  12/16/2023   Hemoglobin A1C  12/27/2023   Yearly kidney function blood test for diabetes  06/25/2024   Yearly kidney health urinalysis for diabetes  06/25/2024   Complete foot exam   06/25/2024   Medicare Annual Wellness Visit  09/09/2024   DTaP/Tdap/Td vaccine (2 - Td or  Tdap) 03/09/2027   Pneumococcal Vaccine for high risk medical condition  Completed   Hepatitis C Screening  Completed   HIV Screening  Completed   HPV Vaccine  Aged Out   Meningitis B Vaccine  Aged Out  *Topic was postponed. The date shown is not the original due date.    Advanced directives: (Provided) Advance directive discussed with you today. I have provided a copy for you to complete at home and have notarized. Once this is complete, please bring a copy in to our office so we can scan it into your chart.  Advance Care Planning is important because it:  [x]  Makes sure you receive the medical care that is consistent with your values, goals, and preferences  [x]  It provides guidance to your family and loved ones and reduces their decisional burden about whether or not they are making the right decisions based on your wishes.  Follow the link provided in your after visit summary or read over the paperwork we have mailed to you to help you started getting your Advance Directives in place. If you need assistance in completing these, please reach out to us  so that we can help you!  See attachments for Preventive Care and Fall Prevention Tips.   Fall Prevention in the Home, Adult Falls can cause injuries and affect people of all ages. There are many simple things that you  can do to make your home safe and to help prevent falls. If you need it, ask for help making these changes. What actions can I take to prevent falls? General information Use good lighting in all rooms. Make sure to: Replace any light bulbs that burn out. Turn on lights if it is dark and use night-lights. Keep items that you use often in easy-to-reach places. Lower the shelves around your home if needed. Move furniture so that there are clear paths around it. Do not keep throw rugs or other things on the floor that can make you trip. If any of your floors are uneven, fix them. Add color or contrast paint or tape to  clearly mark and help you see: Grab bars or handrails. First and last steps of staircases. Where the edge of each step is. If you use a ladder or stepladder: Make sure that it is fully opened. Do not climb a closed ladder. Make sure the sides of the ladder are locked in place. Have someone hold the ladder while you use it. Know where your pets are as you move through your home. What can I do in the bathroom?     Keep the floor dry. Clean up any water that is on the floor right away. Remove soap buildup in the bathtub or shower. Buildup makes bathtubs and showers slippery. Use non-skid mats or decals on the floor of the bathtub or shower. Attach bath mats securely with double-sided, non-slip rug tape. If you need to sit down while you are in the shower, use a non-slip stool. Install grab bars by the toilet and in the bathtub and shower. Do not use towel bars as grab bars. What can I do in the bedroom? Make sure that you have a light by your bed that is easy to reach. Do not use any sheets or blankets on your bed that hang to the floor. Have a firm bench or chair with side arms that you can use for support when you get dressed. What can I do in the kitchen? Clean up any spills right away. If you need to reach something above you, use a sturdy step stool that has a grab bar. Keep electrical cables out of the way. Do not use floor polish or wax that makes floors slippery. What can I do with my stairs? Do not leave anything on the stairs. Make sure that you have a light switch at the top and the bottom of the stairs. Have them installed if you do not have them. Make sure that there are handrails on both sides of the stairs. Fix handrails that are broken or loose. Make sure that handrails are as long as the staircases. Install non-slip stair treads on all stairs in your home if they do not have carpet. Avoid having throw rugs at the top or bottom of stairs, or secure the rugs with carpet  tape to prevent them from moving. Choose a carpet design that does not hide the edge of steps on the stairs. Make sure that carpet is firmly attached to the stairs. Fix any carpet that is loose or worn. What can I do on the outside of my home? Use bright outdoor lighting. Repair the edges of walkways and driveways and fix any cracks. Clear paths of anything that can make you trip, such as tools or rocks. Add color or contrast paint or tape to clearly mark and help you see high doorway thresholds. Trim any bushes or trees on  the main path into your home. Check that handrails are securely fastened and in good repair. Both sides of all steps should have handrails. Install guardrails along the edges of any raised decks or porches. Have leaves, snow, and ice cleared regularly. Use sand, salt, or ice melt on walkways during winter months if you live where there is ice and snow. In the garage, clean up any spills right away, including grease or oil spills. What other actions can I take? Review your medicines with your health care provider. Some medicines can make you confused or feel dizzy. This can increase your chance of falling. Wear closed-toe shoes that fit well and support your feet. Wear shoes that have rubber soles and low heels. Use a cane, walker, scooter, or crutches that help you move around if needed. Talk with your provider about other ways that you can decrease your risk of falls. This may include seeing a physical therapist to learn to do exercises to improve movement and strength. Where to find more information Centers for Disease Control and Prevention, [employer]: TonerPromos.no [employer] on Aging: BaseRingTones.pl National Institute on Aging: BaseRingTones.pl Contact a health care provider if: You are afraid of falling at home. You feel weak, drowsy, or dizzy at home. You fall at home. Get help right away if you: Lose consciousness or have trouble moving after a fall. Have a fall that  causes a head injury. These symptoms may be an emergency. Get help right away. Call 911. Do not wait to see if the symptoms will go away. Do not drive yourself to the hospital. This information is not intended to replace advice given to you by your health care provider. Make sure you discuss any questions you have with your health care provider. Document Revised: 09/25/2021 Document Reviewed: 09/25/2021 Elsevier Patient Education  2024 [employer].

## 2023-09-10 NOTE — Progress Notes (Signed)
 Subjective:   Vanessa Baker is a 48 y.o. who presents for a Medicare Wellness preventive visit.  As a reminder, Annual Wellness Visits don't include a physical exam, and some assessments may be limited, especially if this visit is performed virtually. We may recommend an in-person follow-up visit with your provider if needed.  Visit Complete: Virtual I connected with  Camelia Arlyss Balint on 09/10/23 by a audio enabled telemedicine application and verified that I am speaking with the correct person using two identifiers.  Patient Location: Home  Provider Location: Home Office  I discussed the limitations of evaluation and management by telemedicine. The patient expressed understanding and agreed to proceed.  Vital Signs: Because this visit was a virtual/telehealth visit, some criteria may be missing or patient reported. Any vitals not documented were not able to be obtained and vitals that have been documented are patient reported.  VideoDeclined- This patient declined Librarian, academic. Therefore the visit was completed with audio only.  Persons Participating in Visit: Patient.  AWV Questionnaire: No: Patient Medicare AWV questionnaire was not completed prior to this visit.  Cardiac Risk Factors include: hypertension;diabetes mellitus;dyslipidemia;smoking/ tobacco exposure     Objective:    Today's Vitals   09/10/23 1553  Weight: 153 lb (69.4 kg)  Height: 5' 2 (1.575 m)  PainSc: 5    Body mass index is 27.98 kg/m.     09/10/2023    4:09 PM 05/25/2023    4:56 PM 11/09/2021    4:59 AM 10/26/2021    3:33 PM 02/11/2021    3:01 PM 01/16/2021    9:44 AM 01/16/2021    9:31 AM  Advanced Directives  Does Patient Have a Medical Advance Directive? No  No No No  No  Would patient like information on creating a medical advance directive? Yes (MAU/Ambulatory/Procedural Areas - Information given)   No - Patient declined  No - Patient declined No -  Patient declined     Information is confidential and restricted. Go to Review Flowsheets to unlock data.    Current Medications (verified) Outpatient Encounter Medications as of 09/10/2023  Medication Sig   Accu-Chek Softclix Lancets lancets To check blood sugars two times daily.   albuterol  (VENTOLIN  HFA) 108 (90 Base) MCG/ACT inhaler INHALE 2 PUFFS BY MOUTH EVERY 6 HOURS AS NEEDED FOR WHEEZING AND FOR SHORTNESS OF BREATH   ARIPiprazole (ABILIFY) 2 MG tablet Take 2 mg by mouth daily.   Ascorbic Acid (VITAMIN C) 1000 MG tablet Take 1,000 mg by mouth daily.   atorvastatin  (LIPITOR) 40 MG tablet Take 1 tablet (40 mg total) by mouth daily.   baclofen  (LIORESAL ) 10 MG tablet Take 1 tablet (10 mg total) by mouth 3 (three) times daily as needed. for muscle spams   benztropine  (COGENTIN ) 1 MG tablet Take 1 mg by mouth at bedtime.   Blood Glucose Monitoring Suppl (ACCU-CHEK GUIDE) w/Device KIT To check blood sugar twice a day with goal fasting in morning <130 and goal 2 hours after meal <180.  Write all checks down for provider visits.   budesonide -glycopyrrolate-formoterol  (BREZTRI  AEROSPHERE) 160-9-4.8 MCG/ACT AERO inhaler Inhale 2 puffs into the lungs 2 (two) times daily.   EPINEPHrine  0.3 mg/0.3 mL IJ SOAJ injection Inject 0.3 mg into the muscle as needed for anaphylaxis.   gabapentin  (NEURONTIN ) 800 MG tablet Take 800 mg by mouth in the morning, at noon, in the evening, and at bedtime.   glucose blood (ACCU-CHEK GUIDE TEST) test strip To check blood  sugars 2 times daily.   lidocaine  (LIDODERM ) 5 % Place 1 patch onto the skin daily. Remove & Discard patch within 12 hours or as directed by MD   Methylcobalamin (B-12) 1000 MCG TBDP Take 1,000 mcg by mouth daily.   mupirocin  ointment (BACTROBAN ) 2 % Apply 1 Application topically 2 (two) times daily. (Patient taking differently: Apply 1 Application topically 2 (two) times daily. PRN)   nortriptyline (PAMELOR) 10 MG capsule Take 30 mg by mouth at  bedtime.   omega-3 acid ethyl esters (LOVAZA ) 1 g capsule Take 1 capsule (1 g total) by mouth 2 (two) times daily.   omeprazole  (PRILOSEC) 20 MG capsule Take 1 capsule (20 mg total) by mouth daily.   ramelteon (ROZEREM) 8 MG tablet Take 8 mg by mouth at bedtime.   rOPINIRole  (REQUIP ) 2 MG tablet Take 1 tablet (2 mg total) by mouth at bedtime.   Semaglutide ,0.25 or 0.5MG /DOS, (OZEMPIC , 0.25 OR 0.5 MG/DOSE,) 2 MG/3ML SOPN Inject 0.5 mg into the skin once a week.   sertraline  (ZOLOFT ) 100 MG tablet Take 100 mg by mouth daily.   triamcinolone  cream (KENALOG ) 0.1 % APPLY  CREAM EXTERNALLY TWICE DAILY (Patient not taking: Reported on 09/10/2023)   zolpidem (AMBIEN) 10 MG tablet Take 10 mg by mouth at bedtime. (Patient not taking: Reported on 09/10/2023)   No facility-administered encounter medications on file as of 09/10/2023.    Allergies (verified) Amitriptyline and Wellbutrin  [bupropion ]   History: Past Medical History:  Diagnosis Date   Allergy    Anxiety    Asthma    Bipolar disorder (HCC)    Bursitis    Depression    Diabetes mellitus without complication (HCC)    Essential hypertension 03/28/2017   Frequent headaches    GERD (gastroesophageal reflux disease)    Headache    High cholesterol    Hypertension    Irritable bowel syndrome (IBS)    Irritable colon 02/23/2010   Migraine 04/02/2017   Obesity (BMI 30-39.9) 11/25/2013   Seizure (HCC) [id number]   Seizures (HCC)    Tremor    Past Surgical History:  Procedure Laterality Date   CHOLECYSTECTOMY N/A 04/27/2019   Procedure: LAPAROSCOPIC CHOLECYSTECTOMY;  Surgeon: Marolyn Nest, MD;  Location: [facility] ORS;  Service: General;  Laterality: N/A;   TIBIA IM NAIL INSERTION Right 10/26/2021   Procedure: INTRAMEDULLARY (IM) NAIL TIBIAL;  Surgeon: Rollene Cough, MD;  Location: [facility] ORS;  Service: Orthopedics;  Laterality: Right;   Family History  Problem Relation Age of Onset   Bipolar disorder Mother    Schizophrenia Mother     Hypertension Mother    Diabetes Mother    Cancer Mother    Anxiety disorder Mother    ADD / ADHD Mother    Alcohol abuse Mother    Drug abuse Mother    Heart disease Mother    Bipolar disorder Sister    Anxiety disorder Sister    Drug abuse Sister    Bipolar disorder Brother    Anxiety disorder Brother    Drug abuse Brother    Hypertension Father    Cancer Father    Diabetes Father    Breast cancer Maternal Aunt    Leukemia Maternal Aunt    Breast cancer Paternal Aunt    Breast cancer Maternal Grandmother    Alcohol abuse Maternal Grandmother    Bipolar disorder Maternal Grandmother    Breast cancer Paternal Grandmother    Parkinson's disease Paternal Grandmother    Alcohol abuse Maternal Grandfather  Prostate cancer Maternal Grandfather    Alcohol abuse Maternal Uncle    Multiple myeloma Maternal Uncle    ADD / ADHD Son    Throat cancer Maternal Aunt    Tremor Neg Hx    Social History   Socioeconomic History   Marital status: Married    Spouse name: Waylan   Number of children: 1   Years of education: 14   Highest education level: Associate degree: occupational, Scientist, product/process development, or vocational program  Occupational History   Occupation: disability  Tobacco Use   Smoking status: Every Day    Current packs/day: 1.00    Average packs/day: 0.6 packs/day for 30.4 years (16.9 ttl pk-yrs)    Types: Cigarettes    Start date: 04/25/1992    Last attempt to quit: 04/26/2019   Smokeless tobacco: Never  Vaping Use   Vaping status: Former   Quit date: 09/10/2018  Substance and Sexual Activity   Alcohol use: Yes    Comment: 1-2 drinks monthly or less   Drug use: No   Sexual activity: Yes    Partners: Male    Birth control/protection: None  Other Topics Concern   Not on file  Social History Narrative   Lives with husband, Waylan Lemond Fireman (on [place])   Right-handed   Caffeine use: 1 cup coffee a day, 2 soft drinks per day   Social Drivers of Health   Financial Resource  Strain: Medium Risk (09/10/2023)   Overall Financial Resource Strain (CARDIA)    Difficulty of Paying Living Expenses: Somewhat hard  Food Insecurity: Food Insecurity Present (09/10/2023)   Hunger Vital Sign    Worried About Running Out of Food in the Last Year: Sometimes true    Ran Out of Food in the Last Year: Sometimes true  Transportation Needs: No Transportation Needs (09/10/2023)   PRAPARE - Administrator, Civil Service (Medical): No    Lack of Transportation (Non-Medical): No  Physical Activity: Inactive (09/10/2023)   Exercise Vital Sign    Days of Exercise per Week: 0 days    Minutes of Exercise per Session: 0 min  Stress: No Stress Concern Present (09/10/2023)   [employer] of Occupational Health - Occupational Stress Questionnaire    Feeling of Stress: Not at all  Social Connections: Moderately Isolated (09/10/2023)   Social Connection and Isolation Panel    Frequency of Communication with Friends and Family: Three times a week    Frequency of Social Gatherings with Friends and Family: Once a week    Attends Religious Services: Never    Database administrator or Organizations: No    Attends Engineer, structural: Never    Marital Status: Married    Tobacco Counseling Ready to quit: Not Answered Counseling given: Not Answered    Clinical Intake:  Pre-visit preparation completed: Yes  Pain : 0-10 Pain Score: 5  Pain Type: Acute pain Pain Location: Abdomen Pain Descriptors / Indicators: Aching     BMI - recorded: 27.98 Nutritional Status: BMI 25 -29 Overweight Nutritional Risks: None Diabetes: Yes CBG done?: No Did pt. bring in CBG monitor from home?: No  Lab Results  Component Value Date   HGBA1C 5.7 (H) 06/26/2023   HGBA1C 6.4 (H) 11/23/2022   HGBA1C 6.9 (H) 08/22/2022     How often do you need to have someone help you when you read instructions, pamphlets, or other written materials from your doctor or pharmacy?: 1 -  Never  Interpreter Needed?: No  Information entered by :: Vina Ned, CMA   Activities of Daily Living     09/10/2023    3:55 PM  In your present state of health, do you have any difficulty performing the following activities:  Hearing? 0  Vision? 0  Difficulty concentrating or making decisions? 1  Comment sometimes  Walking or climbing stairs? 0  Dressing or bathing? 0  Doing errands, shopping? 0  Preparing Food and eating ? N  Using the Toilet? N  In the past six months, have you accidently leaked urine? N  Do you have problems with loss of bowel control? N  Managing your Medications? N  Managing your Finances? N  Housekeeping or managing your Housekeeping? N    Patient Care Team: Cannady, Jolene T, NP as PCP - General (Nurse Practitioner) Lane Arthea BRAVO, MD as Consulting Physician (Neurology) Pa, [facility] ([facility])  I have updated your Care Teams any recent Medical Services you may have received from other providers in the past year.     Assessment:   This is a routine wellness examination for Jaylani.  Hearing/Vision screen Hearing Screening - Comments:: Denies hearing loss  Vision Screening - Comments:: Needs DM eye exam, [facility], [facility]   Goals Addressed               This Visit's Progress     Weight (lb) < 120 lb (54.4 kg) (pt-stated)   153 lb (69.4 kg)      Depression Screen     09/10/2023    4:04 PM 06/26/2023    3:46 PM 03/14/2023   10:40 AM 11/23/2022    1:46 PM 08/22/2022    1:35 PM 05/23/2022    1:24 PM 02/12/2022    1:45 PM  PHQ 2/9 Scores  PHQ - 2 Score 2 4 2 4 4 4 3   PHQ- 9 Score 5 13 14 15 17 16 11     Fall Risk     09/10/2023    4:11 PM 06/26/2023    3:46 PM 03/14/2023   10:38 AM 11/23/2022    1:46 PM 08/22/2022    1:35 PM  Fall Risk   Falls in the past year? 0 0 0 1 1  Number falls in past yr: 0 0 0 0 1  Injury with Fall? 0 0 0 0 1  Risk for fall due to : No Fall Risks No Fall Risks No Fall Risks No  Fall Risks History of fall(s)  Follow up Falls evaluation completed Falls evaluation completed Falls evaluation completed Falls evaluation completed     MEDICARE RISK AT HOME:  Medicare Risk at Home Any stairs in or around the home?: Yes If so, are there any without handrails?: No Home free of loose throw rugs in walkways, pet beds, electrical cords, etc?: Yes Adequate lighting in your home to reduce risk of falls?: Yes Life alert?: No Use of a cane, walker or w/c?: No Grab bars in the bathroom?: No Shower chair or bench in shower?: No Elevated toilet seat or a handicapped toilet?: No  TIMED UP AND GO:  Was the test performed?  No  Cognitive Function: 6CIT completed        09/10/2023    4:12 PM 02/01/2020    4:05 PM 01/13/2020    9:07 AM 01/08/2020    2:04 PM 11/19/2017    9:54 AM  6CIT Screen  What Year? 0 points 0 points 0 points 0 points 0 points  What  month? 0 points 0 points 0 points 0 points 0 points  What time? 3 points 0 points 0 points 3 points 0 points  Count back from 20 0 points 2 points 4 points 2 points 0 points  Months in reverse 4 points 2 points 4 points 4 points 0 points  Repeat phrase 4 points 8 points 10 points 8 points 0 points  Total Score 11 points 12 points 18 points 17 points 0 points    Immunizations Immunization History  Administered Date(s) Administered   Influenza Inj Mdck Quad Pf 12/14/2015   Influenza,inj,Quad PF,6+ Mos 02/28/2017, 11/19/2017, 11/07/2018, 10/25/2020, 02/12/2022, 10/25/2022   Influenza-Unspecified 11/06/2019   PFIZER Comirnaty(Gray Top)Covid-19 Tri-Sucrose Vaccine 05/27/2020   PFIZER(Purple Top)SARS-COV-2 Vaccination 09/14/2019, 10/07/2019, 01/09/2021   PNEUMOCOCCAL CONJUGATE-20 10/25/2022   Pneumococcal Polysaccharide-23 11/19/2017, 10/14/2019, 11/06/2019   Tdap 03/08/2017   Unspecified SARS-COV-2 Vaccination 01/09/2021    Screening Tests Health Maintenance  Topic Date Due   Hepatitis B Vaccines (1 of 3 - 19+ 3-dose  series) Never done   OPHTHALMOLOGY EXAM  09/08/2022   COVID-19 Vaccine (5 - 2024-25 season) 10/07/2022   INFLUENZA VACCINE  09/06/2023   MAMMOGRAM  06/25/2024 (Originally 12/24/2019)   Colonoscopy  06/25/2024 (Originally 01/24/2019)   Cervical Cancer Screening (HPV/Pap Cotest)  12/16/2023   HEMOGLOBIN A1C  12/27/2023   Diabetic kidney evaluation - eGFR measurement  06/25/2024   Diabetic kidney evaluation - Urine ACR  06/25/2024   FOOT EXAM  06/25/2024   Medicare Annual Wellness (AWV)  09/09/2024   DTaP/Tdap/Td (2 - Td or Tdap) 03/09/2027   Pneumococcal Vaccine: 19-49 Years  Completed   Hepatitis C Screening  Completed   HIV Screening  Completed   HPV VACCINES  Aged Out   Meningococcal B Vaccine  Aged Out    Health Maintenance  Health Maintenance Due  Topic Date Due   Hepatitis B Vaccines (1 of 3 - 19+ 3-dose series) Never done   OPHTHALMOLOGY EXAM  09/08/2022   COVID-19 Vaccine (5 - 2024-25 season) 10/07/2022   INFLUENZA VACCINE  09/06/2023   Health Maintenance Items Addressed: Mammogram ordered, See Nurse Notes at the end of this note  Additional Screening:  Vision Screening: Recommended annual ophthalmology exams for early detection of glaucoma and other disorders of the eye. Would you like a referral to an eye doctor? No    Dental Screening: Recommended annual dental exams for proper oral hygiene  Community Resource Referral / Chronic Care Management: CRR required this visit?  No   CCM required this visit?  No   Plan:    I have personally reviewed and noted the following in the patient's chart:   Medical and social history Use of alcohol, tobacco or illicit drugs  Current medications and supplements including opioid prescriptions. Patient is not currently taking opioid prescriptions. Functional ability and status Nutritional status Physical activity Advanced directives List of other physicians Hospitalizations, surgeries, and ER visits in previous 12  months Vitals Screenings to include cognitive, depression, and falls Referrals and appointments  In addition, I have reviewed and discussed with patient certain preventive protocols, quality metrics, and best practice recommendations. A written personalized care plan for preventive services as well as general preventive health recommendations were provided to patient.   Vina Ned, CMA   09/10/2023   After Visit Summary: (Mail) Due to this being a telephonic visit, the after visit summary with patients personalized plan was offered to patient via mail   Notes:  6 CIT Score - 11  Placed order for a MMG. Needs DM eye exam. Gave phone# to call and schedule ([facility]) Flu vaccine in the fall Shingles vaccine after age 32 (pharmacy) Lung cancer screening after age 74 Declined DM & Nutrition education referral Declined Covid vaccine

## 2023-10-08 ENCOUNTER — Other Ambulatory Visit: Payer: Self-pay | Admitting: Nurse Practitioner

## 2023-10-08 DIAGNOSIS — G2111 Neuroleptic induced parkinsonism: Secondary | ICD-10-CM

## 2023-10-08 NOTE — Telephone Encounter (Signed)
 Requested Prescriptions  Pending Prescriptions Disp Refills   baclofen  (LIORESAL ) 10 MG tablet [Pharmacy Med Name: Baclofen  10 MG Oral Tablet] 90 tablet 0    Sig: Take 1 tablet by mouth three times daily as needed for muscle spasm     Analgesics:  Muscle Relaxants - baclofen  Passed - 10/08/2023  3:40 PM      Passed - Cr in normal range and within 180 days    Creat  Date Value Ref Range Status  12/19/2018 0.68 0.50 - 1.10 mg/dL Final   Creatinine, Ser  Date Value Ref Range Status  06/26/2023 0.76 0.57 - 1.00 mg/dL Final         Passed - eGFR is 30 or above and within 180 days    GFR, Est African American  Date Value Ref Range Status  12/19/2018 123 > OR = 60 mL/min/1.69m2 Final   GFR calc Af Amer  Date Value Ref Range Status  01/13/2020 108 >59 mL/min/1.73 Final    Comment:    **In accordance with recommendations from the NKF-ASN Task force,**   [facility] is in the process of updating its eGFR calculation to the   2021 CKD-EPI creatinine equation that estimates kidney function   without a race variable.    GFR, Est Non African American  Date Value Ref Range Status  12/19/2018 106 > OR = 60 mL/min/1.40m2 Final   GFR, Estimated  Date Value Ref Range Status  05/25/2023 >60 >60 mL/min Final    Comment:    (NOTE) Calculated using the CKD-EPI Creatinine Equation (2021)    eGFR  Date Value Ref Range Status  06/26/2023 96 >59 mL/min/1.73 Final         Passed - Valid encounter within last 6 months    Recent Outpatient Visits           2 months ago Type 2 diabetes mellitus with obesity (HCC)   [place] [facility] [place], [place] T, NP   3 months ago Type 2 diabetes mellitus with obesity (HCC)   [place] [facility] [place], [place] T, NP   6 months ago Non-recurrent acute suppurative otitis media of both ears without spontaneous rupture of tympanic membranes    [employer] Herold Hadassah SQUIBB, MD

## 2023-10-09 ENCOUNTER — Ambulatory Visit: Admitting: Nurse Practitioner

## 2023-10-09 ENCOUNTER — Ambulatory Visit: Payer: Self-pay

## 2023-10-09 NOTE — Telephone Encounter (Signed)
 FYI Only or Action Required?: FYI only for provider.  Patient was last seen in primary care on 07/24/2023 by Valerio Melanie DASEN, NP.  Called Nurse Triage reporting Diarrhea.  Symptoms began today.  Interventions attempted: Rest, hydration, or home remedies.  Symptoms are: completely resolved.  Triage Disposition: Home Care  Patient/caregiver understands and will follow disposition?: Yes  Answer Assessment - Initial Assessment Questions Patient states had diarrhea this morning until 1330. Patient denies higher acuity questions. Reports symptoms have since resolved. Advised to call if symptoms return, if she develops stools that contain blood or appear dark/tarry/or coffee-grind in appearance, if she develops abdominal pain, nausea or vomiting. Pt verbalized understanding.   1. DIARRHEA SEVERITY: How bad is the diarrhea? How many more stools have you had in the past 24 hours than normal?      Many this morning, resolved around 1330 today  2. ONSET: When did the diarrhea begin?      This morning  3. VOMITING: Are you also vomiting? If Yes, ask: How many times in the past 24 hours?      Denies  4. ABDOMEN PAIN: Are you having any abdomen pain? If Yes, ask: What does it feel like? (e.g., crampy, dull, intermittent, constant)      Denies  7. ORAL INTAKE:      States has been increasing oral fluid intake  8. HYDRATION: Any signs of dehydration? (e.g., dry mouth [not just dry lips], too weak to stand, dizziness, new weight loss) When did you last urinate?     Denies  9. FEVER:     Denies  Protocols used: Diarrhea-A-AH

## 2023-10-09 NOTE — Telephone Encounter (Signed)
 Copied from CRM #[id number]. Topic: Clinical - Pink Word Triage >> Oct 09, 2023  1:03 PM [place] B wrote: Patient has diarrhea

## 2023-10-09 NOTE — Patient Instructions (Incomplete)

## 2023-10-11 ENCOUNTER — Encounter

## 2023-10-11 ENCOUNTER — Ambulatory Visit: Admitting: Nurse Practitioner

## 2023-10-11 DIAGNOSIS — N898 Other specified noninflammatory disorders of vagina: Secondary | ICD-10-CM

## 2023-10-12 NOTE — Patient Instructions (Incomplete)
 Yeast rash under breasts -- use Nystatin  powder Trichomonas -- treating with Flagyl   Healthy Eating, Adult Healthy eating may help you get and keep a healthy body weight, reduce the risk of chronic disease, and live a long and productive life. It is important to follow a healthy eating pattern. Your nutritional and calorie needs should be met mainly by different nutrient-rich foods. What are tips for following this plan? Reading food labels Read labels and choose the following: Reduced or low sodium products. Juices with 100% fruit juice. Foods with low saturated fats (<3 g per serving) and high polyunsaturated and monounsaturated fats. Foods with whole grains, such as whole wheat, cracked wheat, brown rice, and wild rice. Whole grains that are fortified with folic acid. This is recommended for females who are pregnant or who want to become pregnant. Read labels and do not eat or drink the following: Foods or drinks with added sugars. These include foods that contain brown sugar, corn sweetener, corn syrup, dextrose , fructose, glucose, high-fructose corn syrup, honey, invert sugar, lactose, malt syrup, maltose, molasses, raw sugar, sucrose, trehalose, or turbinado sugar. Limit your intake of added sugars to less than 10% of your total daily calories. Do not eat more than the following amounts of added sugar per day: 6 teaspoons (25 g) for females. 9 teaspoons (38 g) for males. Foods that contain processed or refined starches and grains. Refined grain products, such as white flour, degermed cornmeal, white bread, and white rice. Shopping Choose nutrient-rich snacks, such as vegetables, whole fruits, and nuts. Avoid high-calorie and high-sugar snacks, such as potato chips, fruit snacks, and candy. Use oil-based dressings and spreads on foods instead of solid fats such as butter, margarine, sour cream, or cream cheese. Limit pre-made sauces, mixes, and instant products such as flavored rice,  instant noodles, and ready-made pasta. Try more plant-protein sources, such as tofu, tempeh, black beans, edamame, lentils, nuts, and seeds. Explore eating plans such as the Mediterranean diet or vegetarian diet. Try heart-healthy dips made with beans and healthy fats like hummus and guacamole. Vegetables go great with these. Cooking Use oil to saut or stir-fry foods instead of solid fats such as butter, margarine, or lard. Try baking, boiling, grilling, or broiling instead of frying. Remove the fatty part of meats before cooking. Steam vegetables in water or broth. Meal planning  At meals, imagine dividing your plate into fourths: One-half of your plate is fruits and vegetables. One-fourth of your plate is whole grains. One-fourth of your plate is protein, especially lean meats, poultry, eggs, tofu, beans, or nuts. Include low-fat dairy as part of your daily diet. Lifestyle Choose healthy options in all settings, including home, work, school, restaurants, or stores. Prepare your food safely: Wash your hands after handling raw meats. Where you prepare food, keep surfaces clean by regularly washing with hot, soapy water. Keep raw meats separate from ready-to-eat foods, such as fruits and vegetables. Cook seafood, meat, poultry, and eggs to the recommended temperature. Get a food thermometer. Store foods at safe temperatures. In general: Keep cold foods at 58F (4.4C) or below. Keep hot foods at 158F (60C) or above. Keep your freezer at [facility] (-17.8C) or below. Foods are not safe to eat if they have been between the temperatures of 40-158F (4.4-60C) for more than 2 hours. What foods should I eat? Fruits Aim to eat 1-2 cups of fresh, canned (in natural juice), or frozen fruits each day. One cup of fruit equals 1 small apple, 1 large banana, 8  large strawberries, 1 cup (237 g) canned fruit,  cup (82 g) dried fruit, or 1 cup (240 mL) 100% juice. Vegetables Aim to eat 2-4 cups of  fresh and frozen vegetables each day, including different varieties and colors. One cup of vegetables equals 1 cup (91 g) broccoli or cauliflower florets, 2 medium carrots, 2 cups (150 g) raw, leafy greens, 1 large tomato, 1 large bell pepper, 1 large sweet potato, or 1 medium white potato. Grains Aim to eat 5-10 ounce-equivalents of whole grains each day. Examples of 1 ounce-equivalent of grains include 1 slice of bread, 1 cup (40 g) ready-to-eat cereal, 3 cups (24 g) popcorn, or  cup (93 g) cooked rice. Meats and other proteins Try to eat 5-7 ounce-equivalents of protein each day. Examples of 1 ounce-equivalent of protein include 1 egg,  oz nuts (12 almonds, 24 pistachios, or 7 walnut halves), 1/4 cup (90 g) cooked beans, 6 tablespoons (90 g) hummus or 1 tablespoon (16 g) peanut butter. A cut of meat or fish that is the size of a deck of cards is about 3-4 ounce-equivalents (85 g). Of the protein you eat each week, try to have at least 8 sounce (227 g) of seafood. This is about 2 servings per week. This includes salmon, trout, herring, sardines, and anchovies. Dairy Aim to eat 3 cup-equivalents of fat-free or low-fat dairy each day. Examples of 1 cup-equivalent of dairy include 1 cup (240 mL) milk, 8 ounces (250 g) yogurt, 1 ounces (44 g) natural cheese, or 1 cup (240 mL) fortified soy milk. Fats and oils Aim for about 5 teaspoons (21 g) of fats and oils per day. Choose monounsaturated fats, such as canola and olive oils, mayonnaise made with olive oil or avocado oil, avocados, peanut butter, and most nuts, or polyunsaturated fats, such as sunflower, corn, and soybean oils, walnuts, pine nuts, sesame seeds, sunflower seeds, and flaxseed. Beverages Aim for 6 eight-ounce glasses of water per day. Limit coffee to 3-5 eight-ounce cups per day. Limit caffeinated beverages that have added calories, such as soda and energy drinks. If you drink alcohol: Limit how much you have to: 0-1 drink a day if you  are female. 0-2 drinks a day if you are female. Know how much alcohol is in your drink. In the [place] one drink is one 12 oz bottle of beer (355 mL), one 5 oz glass of wine (148 mL), or one 1 oz glass of hard liquor (44 mL). Seasoning and other foods Try not to add too much salt to your food. Try using herbs and spices instead of salt. Try not to add sugar to food. This information is based on [place] nutrition guidelines. To learn more, visit DisposableNylon.be. Exact amounts may vary. You may need different amounts. This information is not intended to replace advice given to you by your health care provider. Make sure you discuss any questions you have with your health care provider. Document Revised: 10/23/2021 Document Reviewed: 10/23/2021 Elsevier Patient Education  2024 [employer].

## 2023-10-14 ENCOUNTER — Encounter: Payer: Self-pay | Admitting: Nurse Practitioner

## 2023-10-14 ENCOUNTER — Ambulatory Visit (INDEPENDENT_AMBULATORY_CARE_PROVIDER_SITE_OTHER): Admitting: Nurse Practitioner

## 2023-10-14 VITALS — BP 132/78 | HR 88 | Temp 98.5°F | Resp 15 | Ht 62.01 in | Wt 153.8 lb

## 2023-10-14 DIAGNOSIS — B354 Tinea corporis: Secondary | ICD-10-CM | POA: Insufficient documentation

## 2023-10-14 DIAGNOSIS — A599 Trichomoniasis, unspecified: Secondary | ICD-10-CM | POA: Diagnosis not present

## 2023-10-14 DIAGNOSIS — Z23 Encounter for immunization: Secondary | ICD-10-CM

## 2023-10-14 DIAGNOSIS — N898 Other specified noninflammatory disorders of vagina: Secondary | ICD-10-CM

## 2023-10-14 LAB — MICROSCOPIC EXAMINATION
Bacteria, UA: NONE SEEN
RBC, Urine: NONE SEEN /HPF (ref 0–2)

## 2023-10-14 LAB — URINALYSIS, ROUTINE W REFLEX MICROSCOPIC
Bilirubin, UA: NEGATIVE
Glucose, UA: NEGATIVE
Ketones, UA: NEGATIVE
Nitrite, UA: NEGATIVE
Protein,UA: NEGATIVE
RBC, UA: NEGATIVE
Specific Gravity, UA: >1.03 — ABNORMAL HIGH (ref 1.005–1.030)
Urobilinogen, Ur: 0.2 mg/dL (ref 0.2–1.0)
pH, UA: 5.5 (ref 5.0–7.5)

## 2023-10-14 LAB — WET PREP FOR TRICH, YEAST, CLUE
Clue Cell Exam: NEGATIVE
Trichomonas Exam: POSITIVE — AB
Yeast Exam: NEGATIVE

## 2023-10-14 MED ORDER — ARIPIPRAZOLE 2 MG PO TABS: 2.0000 mg | ORAL_TABLET | Freq: Every day | ORAL | 3 refills | Status: AC

## 2023-10-14 MED ORDER — SERTRALINE HCL 100 MG PO TABS: 100.0000 mg | ORAL_TABLET | Freq: Every day | ORAL | 3 refills | Status: AC

## 2023-10-14 MED ORDER — FLUCONAZOLE 150 MG PO TABS: 150.0000 mg | ORAL_TABLET | Freq: Every day | ORAL | 0 refills | Status: AC

## 2023-10-14 MED ORDER — NYSTATIN 100000 UNIT/GM EX POWD: 1.0000 | Freq: Three times a day (TID) | CUTANEOUS | 2 refills | Status: AC

## 2023-10-14 MED ORDER — METRONIDAZOLE 500 MG PO TABS: 500.0000 mg | ORAL_TABLET | Freq: Two times a day (BID) | ORAL | 0 refills | Status: AC

## 2023-10-14 NOTE — Assessment & Plan Note (Signed)
 Under both breasts.  Sent in Diflucan  x one dose and Nystatin  powder to treat.  Educated her on this.

## 2023-10-14 NOTE — Progress Notes (Signed)
 BP 132/78 (BP Location: Left Arm, Patient Position: Sitting, Cuff Size: Normal)   Pulse 88   Temp 98.5 F (36.9 C) (Oral)   Resp 15   Ht 5' 2.01 (1.575 m)   Wt 153 lb 12.8 oz (69.8 kg)   LMP 11/26/2018 Comment: pt denies pregnancy-states no period in two years  SpO2 97%   BMI 28.12 kg/m    Subjective:    Patient ID: Vanessa Baker, female    DOB: May 24, 1975, 48 y.o.   MRN: [id number]  HPI: Vanessa Baker is a 48 y.o. female  Chief Complaint  Patient presents with   Vaginal Discharge    Internal itch, always wet. New sexual partner    VAGINAL DISCHARGE About a week or more. Duration: weeks Discharge description: mucous  Pruritus: yes Dysuria: no Malodorous: yes Urinary frequency: yes Fevers: no Abdominal pain: no  Sexual activity: not sexually active not until figures this out -monogamous History of sexually transmitted diseases: no Recent antibiotic use: no Context: stable  Treatments attempted: none  Relevant past medical, surgical, family and social history reviewed and updated as indicated. Interim medical history since our last visit reviewed. Allergies and medications reviewed and updated.  Review of Systems  Constitutional:  Negative for activity change, appetite change, diaphoresis, fatigue and fever.  Respiratory:  Negative for cough, chest tightness and shortness of breath.   Cardiovascular:  Negative for chest pain, palpitations and leg swelling.  Endocrine: Negative for cold intolerance, heat intolerance, polydipsia, polyphagia and polyuria.  Genitourinary:  Positive for frequency and vaginal discharge. Negative for difficulty urinating, dysuria, flank pain, vaginal bleeding and vaginal pain.  Skin:  Positive for rash.  Neurological:  Negative for dizziness, syncope, weakness, light-headedness, numbness and headaches.  Psychiatric/Behavioral: Negative.      Per HPI unless specifically indicated above     Objective:    BP 132/78  (BP Location: Left Arm, Patient Position: Sitting, Cuff Size: Normal)   Pulse 88   Temp 98.5 F (36.9 C) (Oral)   Resp 15   Ht 5' 2.01 (1.575 m)   Wt 153 lb 12.8 oz (69.8 kg)   LMP 11/26/2018 Comment: pt denies pregnancy-states no period in two years  SpO2 97%   BMI 28.12 kg/m   Wt Readings from Last 3 Encounters:  10/14/23 153 lb 12.8 oz (69.8 kg)  09/10/23 153 lb (69.4 kg)  06/26/23 161 lb 6.4 oz (73.2 kg)    Physical Exam Vitals and nursing note reviewed.  Constitutional:      General: She is awake. She is not in acute distress.    Appearance: She is well-developed and well-groomed. She is not ill-appearing or toxic-appearing.  HENT:     Head: Normocephalic.     Right Ear: Hearing and external ear normal.     Left Ear: Hearing and external ear normal.  Eyes:     General: Lids are normal.        Right eye: No discharge.        Left eye: No discharge.     Conjunctiva/sclera: Conjunctivae normal.     Pupils: Pupils are equal, round, and reactive to light.  Neck:     Thyroid : No thyromegaly.     Vascular: No carotid bruit.  Cardiovascular:     Rate and Rhythm: Normal rate and regular rhythm.     Heart sounds: Normal heart sounds. No murmur heard.    No gallop.  Pulmonary:     Effort: Pulmonary effort is normal.  No accessory muscle usage or respiratory distress.     Breath sounds: Normal breath sounds.  Abdominal:     General: Bowel sounds are normal. There is no distension.     Palpations: Abdomen is soft.     Tenderness: There is no abdominal tenderness.  Musculoskeletal:     Cervical back: Normal range of motion and neck supple.     Right lower leg: No edema.     Left lower leg: No edema.  Lymphadenopathy:     Cervical: No cervical adenopathy.  Skin:    General: Skin is warm and dry.     Findings: Rash present.     Comments: Yeast-like rash present under both breasts.  Neurological:     Mental Status: She is alert and oriented to person, place, and time.      Deep Tendon Reflexes: Reflexes are normal and symmetric.     Reflex Scores:      Brachioradialis reflexes are 2+ on the right side and 2+ on the left side.      Patellar reflexes are 2+ on the right side and 2+ on the left side. Psychiatric:        Attention and Perception: Attention normal.        Mood and Affect: Mood normal.        Speech: Speech normal.        Behavior: Behavior normal. Behavior is cooperative.        Thought Content: Thought content normal.    Results for orders placed or performed in visit on 06/26/23  Bayer DCA Hb A1c Waived   Collection Time: 06/26/23  3:46 PM  Result Value Ref Range   HB A1C (BAYER DCA - WAIVED) 5.7 (H) 4.8 - 5.6 %  Microalbumin, Urine Waived   Collection Time: 06/26/23  3:46 PM  Result Value Ref Range   Microalb, Ur Waived 80 (H) 0 - 19 mg/L   Creatinine, Urine Waived 300 10 - 300 mg/dL   Microalb/Creat Ratio <30 <30 mg/g  Basic metabolic panel with GFR   Collection Time: 06/26/23  3:48 PM  Result Value Ref Range   Glucose 144 (H) 70 - 99 mg/dL   BUN 10 6 - 24 mg/dL   Creatinine, Ser 9.23 0.57 - 1.00 mg/dL   eGFR 96 >40 fO/fpw/8.26   BUN/Creatinine Ratio 13 9 - 23   Sodium 137 134 - 144 mmol/L   Potassium 4.2 3.5 - 5.2 mmol/L   Chloride 99 96 - 106 mmol/L   CO2 21 20 - 29 mmol/L   Calcium  9.1 8.7 - 10.2 mg/dL  Lipid Panel w/o Chol/HDL Ratio   Collection Time: 06/26/23  3:48 PM  Result Value Ref Range   Cholesterol, Total 106 100 - 199 mg/dL   Triglycerides 754 (H) 0 - 149 mg/dL   HDL 28 (L) >60 mg/dL   VLDL Cholesterol Cal 38 5 - 40 mg/dL   LDL Chol Calc (NIH) 40 0 - 99 mg/dL  Vitamin B12   Collection Time: 06/26/23  3:48 PM  Result Value Ref Range   Vitamin B-12 1,171 232 - 1,245 pg/mL      Assessment & Plan:   Problem List Items Addressed This Visit       Musculoskeletal and Integument   Tinea corporis   Under both breasts.  Sent in Diflucan  x one dose and Nystatin  powder to treat.  Educated her on this.       Relevant Medications   nystatin  (MYCOSTATIN /NYSTOP ) powder  metroNIDAZOLE  (FLAGYL ) 500 MG tablet   fluconazole  (DIFLUCAN ) 150 MG tablet     Other   Trichomoniasis - Primary   Wet prep noted + trich, negative clue cells and yeast. UA 1+ Leuks, but remainder reassuring.  Start Flagyl  BID for 7 days.  Discussed with her that partner would benefit treatment too, partner was not her significant other and she reports no longer seeing him.  Plan for retest in future.  Recommend protection used when having intercourse.  Educated on findings.      Relevant Medications   nystatin  (MYCOSTATIN /NYSTOP ) powder   metroNIDAZOLE  (FLAGYL ) 500 MG tablet   fluconazole  (DIFLUCAN ) 150 MG tablet   Other Relevant Orders   WET PREP FOR TRICH, YEAST, CLUE   Urinalysis, Routine w reflex microscopic     Follow up plan: Return for as scheduled on 10/30/23 -- cancel 10/22/23 visit.

## 2023-10-14 NOTE — Assessment & Plan Note (Signed)
 Wet prep noted + trich, negative clue cells and yeast. UA 1+ Leuks, but remainder reassuring.  Start Flagyl  BID for 7 days.  Discussed with her that partner would benefit treatment too, partner was not her significant other and she reports no longer seeing him.  Plan for retest in future.  Recommend protection used when having intercourse.  Educated on findings.

## 2023-10-16 ENCOUNTER — Other Ambulatory Visit: Payer: Self-pay | Admitting: Nurse Practitioner

## 2023-10-16 NOTE — Telephone Encounter (Unsigned)
 Copied from CRM [id number]. Topic: Clinical - Medication Refill >> Oct 16, 2023 10:10 AM Willma R wrote: Medication: zolpidem  (AMBIEN ) 10 MG tablet  Has the patient contacted their pharmacy? Yes, call dr  This is the patient's preferred pharmacy:  [facility] [address] [place] - [address] [place] [address] Phone: [phone] Fax: [phone]  Is this the correct pharmacy for this prescription? Yes If no, delete pharmacy and type the correct one.   Has the prescription been filled recently? No  Is the patient out of the medication? Yes  Has the patient been seen for an appointment in the last year OR does the patient have an upcoming appointment? Yes  Can we respond through MyChart? Yes  Agent: Please be advised that Rx refills may take up to 3 business days. We ask that you follow-up with your pharmacy.

## 2023-10-17 MED ORDER — ZOLPIDEM TARTRATE 10 MG PO TABS: 10.0000 mg | ORAL_TABLET | Freq: Every day | ORAL | 2 refills | Status: DC

## 2023-10-17 NOTE — Telephone Encounter (Signed)
 Requested medication (s) are due for refill today: -  Requested medication (s) are on the active medication list: hx med  Last refill:  02/23/23  Future visit scheduled: yes  Notes to clinic:  med not delegated to NT to RF   Requested Prescriptions  Pending Prescriptions Disp Refills   zolpidem  (AMBIEN ) 10 MG tablet 30 tablet     Sig: Take 1 tablet (10 mg total) by mouth at bedtime.     Not Delegated - Psychiatry:  Anxiolytics/Hypnotics Failed - 10/17/2023 12:58 PM      Failed - This refill cannot be delegated      Failed - Urine Drug Screen completed in last 360 days      Passed - Valid encounter within last 6 months    Recent Outpatient Visits           3 days ago Trichomoniasis   [place] [facility] [place], [place] T, NP   2 months ago Type 2 diabetes mellitus with obesity (HCC)   [place] [facility] [place], Hortense T, NP   3 months ago Type 2 diabetes mellitus with obesity (HCC)   [place] [facility] [place], [place] T, NP   7 months ago Non-recurrent acute suppurative otitis media of both ears without spontaneous rupture of tympanic membranes    [facility] Herold Hadassah SQUIBB, MD

## 2023-10-22 ENCOUNTER — Ambulatory Visit: Admitting: Nurse Practitioner

## 2023-10-27 NOTE — Patient Instructions (Incomplete)

## 2023-10-30 ENCOUNTER — Ambulatory Visit: Admitting: Nurse Practitioner

## 2023-10-30 DIAGNOSIS — E1149 Type 2 diabetes mellitus with other diabetic neurological complication: Secondary | ICD-10-CM

## 2023-10-30 DIAGNOSIS — Z7985 Long-term (current) use of injectable non-insulin antidiabetic drugs: Secondary | ICD-10-CM

## 2023-10-30 DIAGNOSIS — F5101 Primary insomnia: Secondary | ICD-10-CM

## 2023-10-30 DIAGNOSIS — E1169 Type 2 diabetes mellitus with other specified complication: Secondary | ICD-10-CM

## 2023-10-30 DIAGNOSIS — F3162 Bipolar disorder, current episode mixed, moderate: Secondary | ICD-10-CM

## 2023-10-30 DIAGNOSIS — K219 Gastro-esophageal reflux disease without esophagitis: Secondary | ICD-10-CM

## 2023-10-30 DIAGNOSIS — E1159 Type 2 diabetes mellitus with other circulatory complications: Secondary | ICD-10-CM

## 2023-10-30 DIAGNOSIS — G2111 Neuroleptic induced parkinsonism: Secondary | ICD-10-CM

## 2023-10-30 DIAGNOSIS — Z23 Encounter for immunization: Secondary | ICD-10-CM

## 2023-10-30 DIAGNOSIS — Z0000 Encounter for general adult medical examination without abnormal findings: Secondary | ICD-10-CM

## 2023-10-30 DIAGNOSIS — E538 Deficiency of other specified B group vitamins: Secondary | ICD-10-CM

## 2023-11-09 ENCOUNTER — Other Ambulatory Visit: Payer: Self-pay | Admitting: Nurse Practitioner

## 2023-11-09 DIAGNOSIS — T43505A Adverse effect of unspecified antipsychotics and neuroleptics, initial encounter: Secondary | ICD-10-CM

## 2023-11-11 NOTE — Telephone Encounter (Signed)
 Requested Prescriptions  Pending Prescriptions Disp Refills   baclofen  (LIORESAL ) 10 MG tablet [Pharmacy Med Name: Baclofen  10 MG Oral Tablet] 90 tablet 0    Sig: Take 1 tablet by mouth three times daily as needed for muscle spasm     Analgesics:  Muscle Relaxants - baclofen  Passed - 11/11/2023  1:42 PM      Passed - Cr in normal range and within 180 days    Creat  Date Value Ref Range Status  12/19/2018 0.68 0.50 - 1.10 mg/dL Final   Creatinine, Ser  Date Value Ref Range Status  06/26/2023 0.76 0.57 - 1.00 mg/dL Final         Passed - eGFR is 30 or above and within 180 days    GFR, Est African American  Date Value Ref Range Status  12/19/2018 123 > OR = 60 mL/min/1.14m2 Final   GFR calc Af Amer  Date Value Ref Range Status  01/13/2020 108 >59 mL/min/1.73 Final    Comment:    **In accordance with recommendations from the NKF-ASN Task force,**   [facility] is in the process of updating its eGFR calculation to the   2021 CKD-EPI creatinine equation that estimates kidney function   without a race variable.    GFR, Est Non African American  Date Value Ref Range Status  12/19/2018 106 > OR = 60 mL/min/1.37m2 Final   GFR, Estimated  Date Value Ref Range Status  05/25/2023 >60 >60 mL/min Final    Comment:    (NOTE) Calculated using the CKD-EPI Creatinine Equation (2021)    eGFR  Date Value Ref Range Status  06/26/2023 96 >59 mL/min/1.73 Final         Passed - Valid encounter within last 6 months    Recent Outpatient Visits           4 weeks ago Trichomoniasis   [place] [place], [place] T, NP   3 months ago Type 2 diabetes mellitus with obesity   [place] [facility] [place], [place] T, NP   4 months ago Type 2 diabetes mellitus with obesity   [place] [facility] [place], [place] T, NP   8 months ago Non-recurrent acute suppurative otitis media of both ears without spontaneous rupture of tympanic membranes    [place] [facility] Herold Hadassah SQUIBB, MD

## 2023-11-20 NOTE — Progress Notes (Signed)
 Vanessa Baker                                          MRN: [id number]   11/20/2023   The [facility]I Quality Team Specialist reviewed this patient medical record for the purposes of chart review for care gap closure. The following were reviewed: chart review for care gap closure-colorectal cancer screening.  Noncompliant FOBT 2023    VBCI Quality Team

## 2023-11-24 NOTE — Patient Instructions (Incomplete)

## 2023-11-25 ENCOUNTER — Ambulatory Visit: Admitting: Nurse Practitioner

## 2023-11-25 DIAGNOSIS — Z23 Encounter for immunization: Secondary | ICD-10-CM

## 2023-11-25 DIAGNOSIS — E538 Deficiency of other specified B group vitamins: Secondary | ICD-10-CM

## 2023-11-25 DIAGNOSIS — A599 Trichomoniasis, unspecified: Secondary | ICD-10-CM

## 2023-11-25 DIAGNOSIS — F17210 Nicotine dependence, cigarettes, uncomplicated: Secondary | ICD-10-CM

## 2023-11-25 DIAGNOSIS — E1159 Type 2 diabetes mellitus with other circulatory complications: Secondary | ICD-10-CM

## 2023-11-25 DIAGNOSIS — G2581 Restless legs syndrome: Secondary | ICD-10-CM

## 2023-11-25 DIAGNOSIS — K219 Gastro-esophageal reflux disease without esophagitis: Secondary | ICD-10-CM

## 2023-11-25 DIAGNOSIS — F4310 Post-traumatic stress disorder, unspecified: Secondary | ICD-10-CM

## 2023-11-25 DIAGNOSIS — E1169 Type 2 diabetes mellitus with other specified complication: Secondary | ICD-10-CM

## 2023-11-25 DIAGNOSIS — E1149 Type 2 diabetes mellitus with other diabetic neurological complication: Secondary | ICD-10-CM

## 2023-11-25 DIAGNOSIS — F3162 Bipolar disorder, current episode mixed, moderate: Secondary | ICD-10-CM

## 2023-11-25 DIAGNOSIS — Z7985 Long-term (current) use of injectable non-insulin antidiabetic drugs: Secondary | ICD-10-CM

## 2023-12-08 ENCOUNTER — Other Ambulatory Visit: Payer: Self-pay | Admitting: Nurse Practitioner

## 2023-12-08 DIAGNOSIS — G2111 Neuroleptic induced parkinsonism: Secondary | ICD-10-CM

## 2023-12-10 NOTE — Telephone Encounter (Signed)
 Requested Prescriptions  Pending Prescriptions Disp Refills   baclofen  (LIORESAL ) 10 MG tablet [Pharmacy Med Name: Baclofen  10 MG Oral Tablet] 90 tablet 0    Sig: Take 1 tablet by mouth three times daily as needed for muscle spasm     Analgesics:  Muscle Relaxants - baclofen  Passed - 12/10/2023 11:27 AM      Passed - Cr in normal range and within 180 days    Creat  Date Value Ref Range Status  12/19/2018 0.68 0.50 - 1.10 mg/dL Final   Creatinine, Ser  Date Value Ref Range Status  06/26/2023 0.76 0.57 - 1.00 mg/dL Final         Passed - eGFR is 30 or above and within 180 days    GFR, Est African American  Date Value Ref Range Status  12/19/2018 123 > OR = 60 mL/min/1.34m2 Final   GFR calc Af Amer  Date Value Ref Range Status  01/13/2020 108 >59 mL/min/1.73 Final    Comment:    **In accordance with recommendations from the NKF-ASN Task force,**   [facility] is in the process of updating its eGFR calculation to the   2021 CKD-EPI creatinine equation that estimates kidney function   without a race variable.    GFR, Est Non African American  Date Value Ref Range Status  12/19/2018 106 > OR = 60 mL/min/1.30m2 Final   GFR, Estimated  Date Value Ref Range Status  05/25/2023 >60 >60 mL/min Final    Comment:    (NOTE) Calculated using the CKD-EPI Creatinine Equation (2021)    eGFR  Date Value Ref Range Status  06/26/2023 96 >59 mL/min/1.73 Final         Passed - Valid encounter within last 6 months    Recent Outpatient Visits           1 month ago Trichomoniasis   [place] [facility] [place], [place] T, NP   4 months ago Type 2 diabetes mellitus with obesity   [place] [facility] [place], [place] T, NP   5 months ago Type 2 diabetes mellitus with obesity   [place] [facility] [place], [place] T, NP   9 months ago Non-recurrent acute suppurative otitis media of both ears without spontaneous rupture of tympanic membranes     [facility] Herold Hadassah SQUIBB, MD

## 2024-01-01 ENCOUNTER — Other Ambulatory Visit: Payer: Self-pay | Admitting: Nurse Practitioner

## 2024-01-06 NOTE — Telephone Encounter (Signed)
 Requested Prescriptions  Pending Prescriptions Disp Refills   albuterol  (VENTOLIN  HFA) 108 (90 Base) MCG/ACT inhaler [Pharmacy Med Name: Albuterol  Sulfate HFA 108 (90 Base) MCG/ACT Inhalation Aerosol Solution] 18 g 0    Sig: INHALE 2 PUFFS BY MOUTH EVERY 6 HOURS AS NEEDED FOR WHEEZING AND FOR SHORTNESS OF BREATH     Pulmonology:  Beta Agonists 2 Passed - 01/06/2024 10:58 AM      Passed - Last BP in normal range    BP Readings from Last 1 Encounters:  10/14/23 132/78         Passed - Last Heart Rate in normal range    Pulse Readings from Last 1 Encounters:  10/14/23 88         Passed - Valid encounter within last 12 months    Recent Outpatient Visits           2 months ago Trichomoniasis   [place] [facility] [place], [place] T, NP   5 months ago Type 2 diabetes mellitus with obesity   [place] [facility] [place], [place] T, NP   6 months ago Type 2 diabetes mellitus with obesity   [place] [facility] [place], [place] T, NP   9 months ago Non-recurrent acute suppurative otitis media of both ears without spontaneous rupture of tympanic membranes   [place] [facility] Herold Hadassah SQUIBB, MD

## 2024-01-20 ENCOUNTER — Other Ambulatory Visit: Payer: Self-pay | Admitting: Nurse Practitioner

## 2024-01-20 DIAGNOSIS — G2111 Neuroleptic induced parkinsonism: Secondary | ICD-10-CM

## 2024-01-22 ENCOUNTER — Other Ambulatory Visit: Payer: Self-pay | Admitting: Nurse Practitioner

## 2024-01-22 DIAGNOSIS — T43505A Adverse effect of unspecified antipsychotics and neuroleptics, initial encounter: Secondary | ICD-10-CM

## 2024-01-22 NOTE — Telephone Encounter (Signed)
 Too soon for refill,LRF11/4/25 FOR 90/1 RF.  Requested Prescriptions  Pending Prescriptions Disp Refills   baclofen  (LIORESAL ) 10 MG tablet [Pharmacy Med Name: Baclofen  10 MG Oral Tablet] 90 tablet 0    Sig: Take 1 tablet by mouth three times daily as needed for muscle spasm     Analgesics:  Muscle Relaxants - baclofen  Failed - 01/22/2024 11:56 AM      Failed - Cr in normal range and within 180 days    Creat  Date Value Ref Range Status  12/19/2018 0.68 0.50 - 1.10 mg/dL Final   Creatinine, Ser  Date Value Ref Range Status  06/26/2023 0.76 0.57 - 1.00 mg/dL Final         Failed - eGFR is 30 or above and within 180 days    GFR, Est African American  Date Value Ref Range Status  12/19/2018 123 > OR = 60 mL/min/1.24m2 Final   GFR calc Af Amer  Date Value Ref Range Status  01/13/2020 108 >59 mL/min/1.73 Final    Comment:    **In accordance with recommendations from the NKF-ASN Task force,**   [facility] is in the process of updating its eGFR calculation to the   2021 CKD-EPI creatinine equation that estimates kidney function   without a race variable.    GFR, Est Non African American  Date Value Ref Range Status  12/19/2018 106 > OR = 60 mL/min/1.45m2 Final   GFR, Estimated  Date Value Ref Range Status  05/25/2023 >60 >60 mL/min Final    Comment:    (NOTE) Calculated using the CKD-EPI Creatinine Equation (2021)    eGFR  Date Value Ref Range Status  06/26/2023 96 >59 mL/min/1.73 Final         Passed - Valid encounter within last 6 months    Recent Outpatient Visits           3 months ago Trichomoniasis   [place] [facility] [place], [place] T, NP   6 months ago Type 2 diabetes mellitus with obesity   [place] [facility] [place], [place] T, NP   7 months ago Type 2 diabetes mellitus with obesity   [place] [facility] [place], [place] T, NP   10 months ago Non-recurrent acute suppurative otitis media of both ears  without spontaneous rupture of tympanic membranes   [place] [facility] Herold Hadassah SQUIBB, MD

## 2024-01-24 ENCOUNTER — Other Ambulatory Visit: Payer: Self-pay | Admitting: Nurse Practitioner

## 2024-01-24 DIAGNOSIS — T43505A Adverse effect of unspecified antipsychotics and neuroleptics, initial encounter: Secondary | ICD-10-CM

## 2024-01-24 NOTE — Telephone Encounter (Signed)
 Copied from CRM #[id number]. Topic: Clinical - Medication Refill >> Jan 24, 2024  1:54 PM Everette C wrote: Medication: baclofen  (LIORESAL ) 10 MG tablet [[id number]]  zolpidem  (AMBIEN ) 10 MG tablet [[id number]]  Has the patient contacted their pharmacy? Yes (Agent: If no, request that the patient contact the pharmacy for the refill. If patient does not wish to contact the pharmacy document the reason why and proceed with request.) (Agent: If yes, when and what did the pharmacy advise?)  This is the patient's preferred pharmacy:  [facility] [address], [place] - [address] [place] [address] Phone: [phone] Fax: [phone]  Is this the correct pharmacy for this prescription? Yes If no, delete pharmacy and type the correct one.   Has the prescription been filled recently? Yes  Is the patient out of the medication? Yes  Has the patient been seen for an appointment in the last year OR does the patient have an upcoming appointment? Yes  Can we respond through MyChart? No  Agent: Please be advised that Rx refills may take up to 3 business days. We ask that you follow-up with your pharmacy.

## 2024-01-24 NOTE — Telephone Encounter (Signed)
 Requested medication (s) are due for refill today: Yes  Requested medication (s) are on the active medication list: Yes  Last refill:  Baclofen  12/10/23, Ambien  10/17/23  Future visit scheduled:  No  Notes to clinic:  Unable to refill per protocol, cannot delegate. Several missed appointments, routing for approval.     Requested Prescriptions  Pending Prescriptions Disp Refills   baclofen  (LIORESAL ) 10 MG tablet [Pharmacy Med Name: Baclofen  10 MG Oral Tablet] 90 tablet 0    Sig: Take 1 tablet by mouth three times daily as needed for muscle spasm     Analgesics:  Muscle Relaxants - baclofen  Failed - 01/24/2024 10:47 PM      Failed - Cr in normal range and within 180 days    Creat  Date Value Ref Range Status  12/19/2018 0.68 0.50 - 1.10 mg/dL Final   Creatinine, Ser  Date Value Ref Range Status  06/26/2023 0.76 0.57 - 1.00 mg/dL Final         Failed - eGFR is 30 or above and within 180 days    GFR, Est African American  Date Value Ref Range Status  12/19/2018 123 > OR = 60 mL/min/1.34m2 Final   GFR calc Af Amer  Date Value Ref Range Status  01/13/2020 108 >59 mL/min/1.73 Final    Comment:    **In accordance with recommendations from the NKF-ASN Task force,**   [facility] is in the process of updating its eGFR calculation to the   2021 CKD-EPI creatinine equation that estimates kidney function   without a race variable.    GFR, Est Non African American  Date Value Ref Range Status  12/19/2018 106 > OR = 60 mL/min/1.34m2 Final   GFR, Estimated  Date Value Ref Range Status  05/25/2023 >60 >60 mL/min Final    Comment:    (NOTE) Calculated using the CKD-EPI Creatinine Equation (2021)    eGFR  Date Value Ref Range Status  06/26/2023 96 >59 mL/min/1.73 Final         Passed - Valid encounter within last 6 months    Recent Outpatient Visits           3 months ago Trichomoniasis   [facility] [place], [place] T, NP   6 months ago Type 2  diabetes mellitus with obesity   [facility] [place], [place] T, NP   7 months ago Type 2 diabetes mellitus with obesity   [place] [facility] [place], [place] T, NP   10 months ago Non-recurrent acute suppurative otitis media of both ears without spontaneous rupture of tympanic membranes   [place], [place] SQUIBB, MD               zolpidem  (AMBIEN ) 10 MG tablet [Pharmacy Med Name: Zolpidem  Tartrate 10 MG Oral Tablet] 30 tablet 0    Sig: TAKE 1 TABLET BY MOUTH AT BEDTIME     Not Delegated - Psychiatry:  Anxiolytics/Hypnotics Failed - 01/24/2024 10:47 PM      Failed - This refill cannot be delegated      Failed - Urine Drug Screen completed in last 360 days      Passed - Valid encounter within last 6 months    Recent Outpatient Visits           3 months ago Trichomoniasis   [facility] [place], [place] T, NP   6 months ago Type 2 diabetes mellitus with obesity  [facility] [place], [place] T, NP   7 months ago Type 2 diabetes mellitus with obesity   [place] [facility] [place], [place] T, NP   10 months ago Non-recurrent acute suppurative otitis media of both ears without spontaneous rupture of tympanic membranes   [place] [facility] [place] SQUIBB, MD

## 2024-01-24 NOTE — Telephone Encounter (Signed)
 Already requested in a separate encounter on 01/22/24, pending approval by provider.

## 2024-01-28 NOTE — Telephone Encounter (Signed)
 Scheduled

## 2024-02-04 ENCOUNTER — Other Ambulatory Visit: Payer: Self-pay | Admitting: Nurse Practitioner

## 2024-02-22 NOTE — Patient Instructions (Incomplete)
 Be Involved in Caring For Your Health:  Taking Medications When medications are taken as directed, they can greatly improve your health. But if they are not taken as prescribed, they may not work. In some cases, not taking them correctly can be harmful. To help ensure your treatment remains effective and safe, understand your medications and how to take them. Bring your medications to each visit for review by your provider.  Your lab results, notes, and after visit summary will be available on My Chart. We strongly encourage you to use this feature. If lab results are abnormal the clinic will contact you with the appropriate steps. If the clinic does not contact you assume the results are satisfactory. You can always view your results on My Chart. If you have questions regarding your health or results, please contact the clinic during office hours. You can also ask questions on My Chart.  We at [facility] are grateful that you chose [place] to provide your care. We strive to provide evidence-based and compassionate care and are always looking for feedback. If you get a survey from the clinic please complete this so we can hear your opinions.  Diabetes Mellitus and Foot Care Diabetes, also called diabetes mellitus, may cause problems with your feet and legs because of poor blood flow (circulation). Poor circulation may make your skin: Become thinner and drier. Break more easily. Heal more slowly. Peel and crack. You may also have nerve damage (neuropathy). This can cause decreased feeling in your legs and feet. This means that you may not notice minor injuries to your feet that could lead to more serious problems. Finding and treating problems early is the best way to prevent future foot problems. How to care for your feet Foot hygiene  Wash your feet daily with warm water and mild soap. Do not use hot water. Then, pat your feet and the areas between your toes until they are fully dry. Do  not soak your feet. This can dry your skin. Trim your toenails straight across. Do not dig under them or around the cuticle. File the edges of your nails with an emery board or nail file. Apply a moisturizing lotion or petroleum jelly to the skin on your feet and to dry, brittle toenails. Use lotion that does not contain alcohol and is unscented. Do not apply lotion between your toes. Shoes and socks Wear clean socks or stockings every day. Make sure they are not too tight. Do not wear knee-high stockings. These may decrease blood flow to your legs. Wear shoes that fit well and have enough cushioning. Always look in your shoes before you put them on to be sure there are no objects inside. To break in new shoes, wear them for just a few hours a day. This prevents injuries on your feet. Wounds, scrapes, corns, and calluses  Check your feet daily for blisters, cuts, bruises, sores, and redness. If you cannot see the bottom of your feet, use a mirror or ask someone for help. Do not cut off corns or calluses or try to remove them with medicine. If you find a minor scrape, cut, or break in the skin on your feet, keep it and the skin around it clean and dry. You may clean these areas with mild soap and water. Do not clean the area with peroxide, alcohol, or iodine. If you have a wound, scrape, corn, or callus on your foot, look at it several times a day to make sure it  is healing and not infected. Check for: Redness, swelling, or pain. Fluid or blood. Warmth. Pus or a bad smell. General tips Do not cross your legs. This may decrease blood flow to your feet. Do not use heating pads or hot water bottles on your feet. They may burn your skin. If you have lost feeling in your feet or legs, you may not know this is happening until it is too late. Protect your feet from hot and cold by wearing shoes, such as at the beach or on hot pavement. Schedule a complete foot exam at least once a year or more often if  you have foot problems. Report any cuts, sores, or bruises to your health care provider right away. Where to find more information American Diabetes Association: diabetes.org Association of Diabetes Care & Education Specialists: diabeteseducator.org Contact a health care provider if: You have a condition that increases your risk of infection, and you have any cuts, sores, or bruises on your feet. You have an injury that is not healing. You have redness on your legs or feet. You feel burning or tingling in your legs or feet. You have pain or cramps in your legs and feet. Your legs or feet are numb. Your feet always feel cold. You have pain around any toenails. Get help right away if: You have a wound, scrape, corn, or callus on your foot and: You have signs of infection. You have a fever. You have a red line going up your leg. This information is not intended to replace advice given to you by your health care provider. Make sure you discuss any questions you have with your health care provider. Document Revised: 07/26/2021 Document Reviewed: 07/26/2021 Elsevier Patient Education  2024 [employer].

## 2024-02-24 ENCOUNTER — Ambulatory Visit: Admitting: Nurse Practitioner

## 2024-02-24 DIAGNOSIS — F17210 Nicotine dependence, cigarettes, uncomplicated: Secondary | ICD-10-CM

## 2024-02-24 DIAGNOSIS — F1411 Cocaine abuse, in remission: Secondary | ICD-10-CM

## 2024-02-24 DIAGNOSIS — F3162 Bipolar disorder, current episode mixed, moderate: Secondary | ICD-10-CM

## 2024-02-24 DIAGNOSIS — E1149 Type 2 diabetes mellitus with other diabetic neurological complication: Secondary | ICD-10-CM

## 2024-02-24 DIAGNOSIS — Z23 Encounter for immunization: Secondary | ICD-10-CM

## 2024-02-24 DIAGNOSIS — J4540 Moderate persistent asthma, uncomplicated: Secondary | ICD-10-CM

## 2024-02-24 DIAGNOSIS — F4310 Post-traumatic stress disorder, unspecified: Secondary | ICD-10-CM

## 2024-02-24 DIAGNOSIS — K219 Gastro-esophageal reflux disease without esophagitis: Secondary | ICD-10-CM

## 2024-02-24 DIAGNOSIS — E119 Type 2 diabetes mellitus without complications: Secondary | ICD-10-CM

## 2024-02-24 DIAGNOSIS — E1159 Type 2 diabetes mellitus with other circulatory complications: Secondary | ICD-10-CM

## 2024-02-24 DIAGNOSIS — G2581 Restless legs syndrome: Secondary | ICD-10-CM

## 2024-02-24 DIAGNOSIS — E1169 Type 2 diabetes mellitus with other specified complication: Secondary | ICD-10-CM

## 2024-02-29 NOTE — Patient Instructions (Incomplete)

## 2024-03-04 ENCOUNTER — Ambulatory Visit: Admitting: Nurse Practitioner

## 2024-03-04 DIAGNOSIS — K219 Gastro-esophageal reflux disease without esophagitis: Secondary | ICD-10-CM

## 2024-03-04 DIAGNOSIS — E538 Deficiency of other specified B group vitamins: Secondary | ICD-10-CM

## 2024-03-04 DIAGNOSIS — F1411 Cocaine abuse, in remission: Secondary | ICD-10-CM

## 2024-03-04 DIAGNOSIS — F4310 Post-traumatic stress disorder, unspecified: Secondary | ICD-10-CM

## 2024-03-04 DIAGNOSIS — E119 Type 2 diabetes mellitus without complications: Secondary | ICD-10-CM

## 2024-03-04 DIAGNOSIS — G2581 Restless legs syndrome: Secondary | ICD-10-CM

## 2024-03-04 DIAGNOSIS — J4540 Moderate persistent asthma, uncomplicated: Secondary | ICD-10-CM

## 2024-03-04 DIAGNOSIS — E1149 Type 2 diabetes mellitus with other diabetic neurological complication: Secondary | ICD-10-CM

## 2024-03-04 DIAGNOSIS — F17210 Nicotine dependence, cigarettes, uncomplicated: Secondary | ICD-10-CM

## 2024-03-04 DIAGNOSIS — E1159 Type 2 diabetes mellitus with other circulatory complications: Secondary | ICD-10-CM

## 2024-03-04 DIAGNOSIS — F3162 Bipolar disorder, current episode mixed, moderate: Secondary | ICD-10-CM

## 2024-03-04 DIAGNOSIS — E785 Hyperlipidemia, unspecified: Secondary | ICD-10-CM

## 2024-09-15 ENCOUNTER — Ambulatory Visit
